# Patient Record
Sex: Male | Born: 1950
Health system: Southern US, Community
[De-identification: ages and names within clinical notes are randomized; demographics above are authoritative.]

## PROBLEM LIST (undated history)

## (undated) DIAGNOSIS — I2510 Atherosclerotic heart disease of native coronary artery without angina pectoris: Secondary | ICD-10-CM

## (undated) DIAGNOSIS — C221 Intrahepatic bile duct carcinoma: Secondary | ICD-10-CM

## (undated) DIAGNOSIS — J449 Chronic obstructive pulmonary disease, unspecified: Secondary | ICD-10-CM

## (undated) DIAGNOSIS — I10 Essential (primary) hypertension: Secondary | ICD-10-CM

## (undated) DIAGNOSIS — I739 Peripheral vascular disease, unspecified: Secondary | ICD-10-CM

## (undated) DIAGNOSIS — C9290 Myeloid leukemia, unspecified, not having achieved remission: Secondary | ICD-10-CM

## (undated) DIAGNOSIS — Z860100 Personal history of colon polyps, unspecified: Secondary | ICD-10-CM

## (undated) DIAGNOSIS — M1990 Unspecified osteoarthritis, unspecified site: Secondary | ICD-10-CM

## (undated) DIAGNOSIS — E7800 Pure hypercholesterolemia, unspecified: Secondary | ICD-10-CM

## (undated) DIAGNOSIS — Z8042 Family history of malignant neoplasm of prostate: Secondary | ICD-10-CM

## (undated) DIAGNOSIS — M109 Gout, unspecified: Secondary | ICD-10-CM

## (undated) DIAGNOSIS — M779 Enthesopathy, unspecified: Secondary | ICD-10-CM

## (undated) DIAGNOSIS — N289 Disorder of kidney and ureter, unspecified: Secondary | ICD-10-CM

## (undated) DIAGNOSIS — C3490 Malignant neoplasm of unspecified part of unspecified bronchus or lung: Secondary | ICD-10-CM

## (undated) DIAGNOSIS — E1169 Type 2 diabetes mellitus with other specified complication: Secondary | ICD-10-CM

## (undated) DIAGNOSIS — Z83719 Family history of colon polyps, unspecified: Secondary | ICD-10-CM

## (undated) DIAGNOSIS — C9210 Chronic myeloid leukemia, BCR/ABL-positive, not having achieved remission: Secondary | ICD-10-CM

## (undated) DIAGNOSIS — H269 Unspecified cataract: Secondary | ICD-10-CM

## (undated) DIAGNOSIS — E785 Hyperlipidemia, unspecified: Secondary | ICD-10-CM

## (undated) DIAGNOSIS — Z8371 Family history of colonic polyps: Secondary | ICD-10-CM

## (undated) DIAGNOSIS — N400 Enlarged prostate without lower urinary tract symptoms: Secondary | ICD-10-CM

## (undated) DIAGNOSIS — C249 Malignant neoplasm of biliary tract, unspecified: Secondary | ICD-10-CM

## (undated) HISTORY — DX: Malignant neoplasm of unspecified part of unspecified bronchus or lung: C34.90

## (undated) HISTORY — DX: Unspecified osteoarthritis, unspecified site: M19.90

## (undated) HISTORY — DX: Enthesopathy, unspecified: M77.9

## (undated) HISTORY — DX: Gout, unspecified: M10.9

## (undated) HISTORY — DX: Unspecified cataract: H26.9

## (undated) HISTORY — DX: Chronic obstructive pulmonary disease, unspecified: J44.9

## (undated) HISTORY — DX: Essential (primary) hypertension: I10

## (undated) HISTORY — DX: Malignant neoplasm of biliary tract, unspecified: C24.9

## (undated) HISTORY — DX: Family history of malignant neoplasm of prostate: Z80.42

## (undated) HISTORY — DX: Myeloid leukemia, unspecified, not having achieved remission: C92.90

## (undated) HISTORY — DX: Intrahepatic bile duct carcinoma: C22.1

## (undated) HISTORY — DX: Personal history of colonic polyps: Z86.010

## (undated) HISTORY — PX: OTHER SURGICAL HISTORY: SHX169

## (undated) HISTORY — DX: Benign prostatic hyperplasia without lower urinary tract symptoms: N40.0

## (undated) HISTORY — PX: UPPER GASTROINTESTINAL ENDOSCOPY: SHX188

## (undated) HISTORY — DX: Hyperlipidemia, unspecified: E78.5

## (undated) HISTORY — PX: CHOLECYSTECTOMY: SHX55

## (undated) HISTORY — DX: Chronic myeloid leukemia, BCR/ABL-positive, not having achieved remission: C92.10

## (undated) HISTORY — DX: Family history of colon polyps, unspecified: Z83.719

## (undated) HISTORY — DX: Type 2 diabetes mellitus with other specified complication: E11.69

## (undated) HISTORY — DX: Peripheral vascular disease, unspecified: I73.9

## (undated) HISTORY — PX: APPENDECTOMY: SHX54

## (undated) HISTORY — DX: Pure hypercholesterolemia, unspecified: E78.00

## (undated) HISTORY — DX: Disorder of kidney and ureter, unspecified: N28.9

## (undated) HISTORY — DX: Family history of colonic polyps: Z83.71

## (undated) HISTORY — DX: Atherosclerotic heart disease of native coronary artery without angina pectoris: I25.10

## (undated) HISTORY — DX: Personal history of colon polyps, unspecified: Z86.0100

---

## 1998-09-23 HISTORY — PX: CARDIAC CATHETERIZATION: SHX172

## 1999-10-12 ENCOUNTER — Encounter: Admission: RE | Admit: 1999-10-12 | Discharge: 1999-10-12 | Payer: Self-pay | Admitting: *Deleted

## 1999-10-12 ENCOUNTER — Encounter: Payer: Self-pay | Admitting: *Deleted

## 2000-11-05 ENCOUNTER — Encounter: Payer: Self-pay | Admitting: Occupational Medicine

## 2000-11-05 ENCOUNTER — Encounter: Admission: RE | Admit: 2000-11-05 | Discharge: 2000-11-05 | Payer: Self-pay | Admitting: Occupational Medicine

## 2001-05-04 ENCOUNTER — Ambulatory Visit (HOSPITAL_COMMUNITY): Admission: RE | Admit: 2001-05-04 | Discharge: 2001-05-04 | Payer: Self-pay | Admitting: Gastroenterology

## 2001-05-04 ENCOUNTER — Encounter (INDEPENDENT_AMBULATORY_CARE_PROVIDER_SITE_OTHER): Payer: Self-pay

## 2008-08-27 ENCOUNTER — Emergency Department (HOSPITAL_COMMUNITY): Admission: EM | Admit: 2008-08-27 | Discharge: 2008-08-27 | Payer: Self-pay | Admitting: Emergency Medicine

## 2009-04-06 ENCOUNTER — Inpatient Hospital Stay (HOSPITAL_BASED_OUTPATIENT_CLINIC_OR_DEPARTMENT_OTHER): Admission: RE | Admit: 2009-04-06 | Discharge: 2009-04-06 | Payer: Self-pay | Admitting: Cardiology

## 2011-02-05 NOTE — Cardiovascular Report (Signed)
Kyle Montoya, PEW NO.:  [id number]   MEDICAL RECORD NO.:  [id number]          PATIENT TYPE:  OIB   LOCATION:  1965                         FACILITY:  [facility]   PHYSICIAN:  Peter M. Swaziland, M.D.  DATE OF BIRTH:  [birth year]   DATE OF PROCEDURE:  04/06/2009  DATE OF DISCHARGE:  04/06/2009                            CARDIAC CATHETERIZATION   INDICATIONS FOR PROCEDURE:  A 60 year old male with history of  hypertension, hypercholesterolemia, and right bundle-branch block.  Recent stress Cardiolite study showed an abnormal nuclear scan  demonstrating possible inferior wall ischemia.   PROCEDURE:  Left heart catheterization, coronary and left ventricular  angiography.   ACCESS:  Via the right femoral artery using standard Seldinger  technique.   EQUIPMENT:  4-French 4-cm left Judkins catheter, 4-French 3-DRC  catheter, 4-French pigtail catheter, and 4-French arterial sheath.   MEDICATIONS:  Local anesthesia, 1% Xylocaine, and Versed 2 mg IV.   CONTRAST:  90 mL of Omnipaque.   HEMODYNAMIC DATA:  Aortic pressure was 125/75 with a mean of 97 mmHg.  Left ventricular pressure is 122 with EDP of 15 mmHg.   ANGIOGRAPHIC DATA:  Left coronary artery arises and distributes  normally.  The left main coronary is normal.   The left anterior descending artery is a large vessel that wraps around  the apex and supplies the distal inferior wall.  There is mild diffuse  narrowing in the mid LAD, less than 20% without any focal plaque noted.  This may be a segment of mild bridging.   The left circumflex coronary artery appears normal.   The right coronary is a large dominant vessel.  It has mild irregularity  in the midvessel, approximately 10%.   Left ventricular angiography performed in the RAO view demonstrates  normal left ventricular size and contractility with normal systolic  function.  Ejection fraction is estimated at 50%.  No mitral  regurgitation or prolapse.   FINAL INTERPRETATION:  1. No significant obstructive atherosclerotic coronary artery disease.  2. Normal left ventricular function.           ______________________________  Peter M. Swaziland, M.D.     PMJ/MEDQ  D:  04/06/2009  T:  04/07/2009  Job:  [id number]   cc:   Dr. Morton Peters, PA

## 2011-02-05 NOTE — H&P (Signed)
[name], [name] NO.:  [id number]   MEDICAL RECORD NO.:  [id number]           PATIENT TYPE:   LOCATION:                                 FACILITY:   PHYSICIAN:  Kyle Montoya, M.D.  DATE OF BIRTH:  09/29/1950   DATE OF ADMISSION:  04/06/2009  DATE OF DISCHARGE:                              HISTORY & PHYSICAL   HISTORY OF PRESENT ILLNESS:  Kyle Montoya is a 60 year old white male with  known history of hypercholesterolemia and hypertension.  He has a  chronic right bundle branch block noted on ECG.  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  He underwent a  nuclear stress test on March 30, 2009.  He was able to exercise for 10  minutes on the Bruce protocol without significant ST segment changes or  chest pain.  However, his nuclear imaging demonstrated evidence of a  reversible inferior septal defect consistent with ischemia.  His left  ventricular function was normal with ejection fraction of 77%.  Compared  to a prior stress test in 2002 these findings were new.  He is now  admitted to undergo diagnostic cardiac catheterization.  It is  noteworthy that the patient did have a cardiac catheterization  approximately 13-15 years ago that he was told was normal.   PAST MEDICAL HISTORY:  Is significant for hypertension, hyperlipidemia  and asthma.  He has a history of arthritis.   ALLERGIES:  He has no known allergies.   CURRENT MEDICATIONS:  1. Include Celebrex 200 mg daily.  2. Tekturna 300 mg daily.  3. Crestor 20 mg daily.  4. Lovaza 4 grams daily.  5. Advair 250/50 two puffs daily.  6. Losartan/HCT 100/25 mg daily.   SOCIAL HISTORY:  The patient works in environmental work.  He does smoke  socially.  He is married and has two children.   FAMILY HISTORY:  Father died at age 4 with colon cancer.  Mother has a  history of hypertension and a brother also has hypertension.  He has  four other siblings who are in good  health.   REVIEW OF SYSTEMS:  He denies any shortness of breath, chest pain,  palpitations or claudication.  He has no history of TIA or stroke.  No  recent change in bowel or bladder habits.  All other review of systems  are reviewed and are negative.   PHYSICAL EXAMINATION:  GENERAL:  The patient is a well-developed male in  no apparent distress.  VITAL SIGNS:  Weight is 237.4 pounds, blood pressure is 128/80, pulse is  74 and regular.  His respirations are normal.  HEENT:  PERRLA, EOMI.  Oropharynx is clear.  NECK:  Neck is supple without JVD, adenopathy, thyromegaly or bruits.  LUNGS:  Lungs were clear.  CARDIAC:  Exam reveals a regular rate and rhythm without gallop, murmur,  rub or click.  ABDOMEN:  Abdomen is soft and nontender.  He has no masses or bruits.  EXTREMITIES:  Femoral and pedal pulses were 2+ and symmetric.  He has no  clubbing,  cyanosis or edema.  SKIN:  Skin is warm and dry.  NEUROLOGIC:  He is alert and oriented x4.  Cranial nerves II-XII are  intact.  He has no focal findings.   LABORATORY DATA:  ECG at rest demonstrates normal sinus rhythm with a  right bundle branch block.  His chest x-ray shows no active disease.  His BUN is 24 with a creatinine of 1.0, otherwise BMET, CBC and coags  were normal.   IMPRESSION:  1. Abnormal stress Cardiolite study demonstrating evidence of      inferoseptal ischemia.  2. Hypertension.  3. Hypercholesterolemia.  4. Right bundle branch block.  5. Asthma.   PLAN:  Will proceed with diagnostic cardiac catheterization with further  therapy pending these results.           ______________________________  Kyle Montoya, M.D.     PMJ/MEDQ  D:  04/05/2009  T:  04/05/2009  Job:  [id number]   cc:   Kyle Montoya, M.D.  Kyle Montoya, [place]

## 2011-02-08 NOTE — Op Note (Signed)
[facility]  Patient:    Kyle Montoya, Kyle Montoya                       MRN: [id number] Proc. Date: 05/04/01 Attending:  Verlin Grills, M.D.                           Operative Report  PROCEDURE:  Colonoscopy.  REFERRING PHYSICIAN:  Dr. Windle Guard.  INDICATION FOR PROCEDURE:  Mr. [name] (DOB:  [birth year]) is a 60 year old male who is referred for his first surveillance colonoscopy with polypectomy to prevent colon cancer. His younger brother has undergone colonoscopic exams to remove colon polyps.  ENDOSCOPIST:  Verlin Grills, M.D.  PREMEDICATION:  Versed 10 mg, Demerol 100 mg.  ENDOSCOPE:  Olympus pediatric colonoscope.  DESCRIPTION OF PROCEDURE:  After obtaining informed consent, the patient was placed in the left lateral decubitus position. I administered intravenous Demerol and intravenous Versed to achieve conscious sedation for the procedure. The patients cardiac rhythm, oxygen saturation and blood pressure were monitored throughout the procedure and documented in the medical record.  Anal inspection was normal. Digital rectal exam was normal. The Olympus pediatric video colonoscope was introduced into the rectum and easily advanced to the cecum. Colonic preparation for the exam today was excellent.  RECTUM:  Normal.  SIGMOID COLON/DESCENDING COLON:  Normal.  SPLENIC FLEXURE:  Normal.  TRANSVERSE COLON:  Normal.  HEPATIC FLEXURE:  Normal.  ASCENDING COLON:  From the ascending colon, three 0.5 mm sessile polyps were removed with the cold biopsy forceps and submitted for pathological interpretation.  CECUM/ILEOCECAL VALVE:  Normal.  ASSESSMENT:  Three small (0.5 mm) polyps removed from the ascending colon with the cold biopsy forceps; otherwise normal proctocolonoscopy to the cecum.  RECOMMENDATIONS:  Repeat colonoscopy in approximately five years. DD:  05/04/01 TD:  05/04/01 Job: [id number]

## 2011-12-05 ENCOUNTER — Telehealth: Payer: Self-pay | Admitting: Cardiology

## 2011-12-05 NOTE — Telephone Encounter (Signed)
LOVx4,stress,12,Cath faxed to [facility] @  [phone] 12/05/11/KM

## 2011-12-06 ENCOUNTER — Encounter: Payer: Self-pay | Admitting: *Deleted

## 2012-04-27 ENCOUNTER — Emergency Department (HOSPITAL_COMMUNITY): Payer: 59

## 2012-04-27 ENCOUNTER — Emergency Department (HOSPITAL_COMMUNITY)
Admission: EM | Admit: 2012-04-27 | Discharge: 2012-04-28 | Disposition: A | Discharge status: Home / Self Care | Payer: 59 | Attending: Emergency Medicine | Admitting: Emergency Medicine

## 2012-04-27 ENCOUNTER — Encounter (HOSPITAL_COMMUNITY): Payer: Self-pay | Admitting: Emergency Medicine

## 2012-04-27 DIAGNOSIS — F10929 Alcohol use, unspecified with intoxication, unspecified: Secondary | ICD-10-CM

## 2012-04-27 DIAGNOSIS — I2510 Atherosclerotic heart disease of native coronary artery without angina pectoris: Secondary | ICD-10-CM | POA: Insufficient documentation

## 2012-04-27 DIAGNOSIS — E119 Type 2 diabetes mellitus without complications: Secondary | ICD-10-CM | POA: Insufficient documentation

## 2012-04-27 DIAGNOSIS — Z79899 Other long term (current) drug therapy: Secondary | ICD-10-CM | POA: Insufficient documentation

## 2012-04-27 DIAGNOSIS — I10 Essential (primary) hypertension: Secondary | ICD-10-CM | POA: Insufficient documentation

## 2012-04-27 DIAGNOSIS — E7800 Pure hypercholesterolemia, unspecified: Secondary | ICD-10-CM | POA: Insufficient documentation

## 2012-04-27 DIAGNOSIS — Z8739 Personal history of other diseases of the musculoskeletal system and connective tissue: Secondary | ICD-10-CM | POA: Insufficient documentation

## 2012-04-27 DIAGNOSIS — R780 Finding of alcohol in blood: Secondary | ICD-10-CM

## 2012-04-27 DIAGNOSIS — F1010 Alcohol abuse, uncomplicated: Secondary | ICD-10-CM | POA: Insufficient documentation

## 2012-04-27 DIAGNOSIS — J45909 Unspecified asthma, uncomplicated: Secondary | ICD-10-CM | POA: Insufficient documentation

## 2012-04-27 LAB — CBC WITH DIFFERENTIAL/PLATELET
Basophils Relative: 2 % — ABNORMAL HIGH (ref 0–1)
Hemoglobin: 14.7 g/dL (ref 13.0–17.0)
Lymphs Abs: 2 10*3/uL (ref 0.7–4.0)
Monocytes Relative: 10 % (ref 3–12)
Neutro Abs: 3.7 10*3/uL (ref 1.7–7.7)
Neutrophils Relative %: 56 % (ref 43–77)
RBC: 4.63 MIL/uL (ref 4.22–5.81)

## 2012-04-27 LAB — COMPREHENSIVE METABOLIC PANEL
CO2: 24 mEq/L (ref 19–32)
Calcium: 8.6 mg/dL (ref 8.4–10.5)
Creatinine, Ser: 0.63 mg/dL (ref 0.50–1.35)
GFR calc Af Amer: >90 mL/min (ref >90)
GFR calc non Af Amer: >90 mL/min (ref >90)
Glucose, Bld: 97 mg/dL (ref 70–99)

## 2012-04-27 NOTE — ED Notes (Signed)
Patient transported to CT 

## 2012-04-27 NOTE — ED Notes (Signed)
Pt is experiencing generalized weakness. Pt unable to fulling sit up on own. Pt much weaker than original assessment. Pt experiencing slight left sided facial droop and bilateral upper extremity weakness. Amy, PA at bedside to assess pt.

## 2012-04-27 NOTE — ED Provider Notes (Addendum)
Developed difficulty speaking and difficulty moving all of his extremities approximately 7:50 PM tonight as witnessed by wife and family members. Admits to drinking 4 beers earlier tonight. He was incontinent of urine He also told his wife that he "couldn't remember anything" presently patient is asymptomatic he is alert Glasgow Coma Score 15 moves all extremities motor strength 5 over 5 overall DTRs symmetric bilaterally knee jerk ankle jerk and biceps was ordered bilaterally  Date: 04/27/2012  Rate: 70  Rhythm: normal sinus rhythm  QRS Axis: normal  Intervals: normal  ST/T Wave abnormalities: normal  Conduction Disutrbances:right bundle branch block  Narrative Interpretation:   Old EKG Reviewed: none available  Doug Sou, MD 04/27/12 2310  12:45 AM patient is alert Glasgow Coma Score 15 amylase without difficulty looks much improved the family.  Had lengthy discussion with patient and with family to get help with his alcohol problem. I also stressed the patient may have had a seizure but doubtful. He is advised not to drive until evaluated by neurologist he is referred to [facility]   Doug Sou, MD 04/28/12 [phone]

## 2012-04-27 NOTE — ED Notes (Signed)
Pt reports he was getting ready to go to bed when he started experiencing numbness and weakness in right hand and arm along with slurred speech, reports he could not move at all. After Fire and EMS arrived, pt reports he felt much better and doesn't feel unbalanced anymore. No obvious deficits noticed. No slurred speech detected while pt talking. Pt is A&Ox4. Equal hand grips.

## 2012-04-27 NOTE — ED Provider Notes (Signed)
History     CSN: [id number]  Arrival date & time 04/27/12  2122   First MD Initiated Contact with Patient 04/27/12 2226      Chief Complaint  Patient presents with  . Transient Ischemic Attack    (Consider location/radiation/quality/duration/timing/severity/associated sxs/prior treatment) HPI Comments: Judieth Keens 61 y.o. male   The chief complaint is: Patient presents with:   Transient Ischemic Attack   The patient has medical history significant for:   Past Medical History:   Hypercholesterolemia                                         HTN (hypertension)                                           Asthma                                                       CAD (coronary artery disease)                                PAD (peripheral artery disease)                              Renal insufficiency                                          Diabetes mellitus                                            Dyslipidemia                                                 Arthritis                                                   Patient here with family. Patient states that he was lying in bed and suddenly was unable to remember anything. Called for help. Wife states that he was drooling out of the left side of his mouth and unable to move his arms or legs. He was unable to speak correctly and seemed confused. He states that vision is blurry in both eyes. Wife states that he urinated on himself during this time. Wfe state that he drank about four beers tonight,.Wife and children are very worried and state "He is just not like this." Deny fever, chills, N/V or recent history of viral illness. Denies CP, SOB.    The history is provided by the patient, a  relative, a significant other and medical records. The history is limited by the condition of the patient.    Past Medical History  Diagnosis Date  . Hypercholesterolemia   . HTN (hypertension)   . Asthma   . CAD (coronary artery  disease)   . PAD (peripheral artery disease)   . Renal insufficiency   . Diabetes mellitus   . Dyslipidemia   . Arthritis     History reviewed. No pertinent past surgical history.  Family History  Problem Relation Age of Onset  . Colon cancer Father 68    deceased  . Hypertension Mother   . Hypertension Brother     History  Substance Use Topics  . Smoking status: Not on file  . Smokeless tobacco: Not on file  . Alcohol Use: Not on file      Review of Systems  Constitutional: Negative for fever, chills and fatigue.  HENT: Positive for drooling. Negative for hearing loss, trouble swallowing, neck pain and neck stiffness.   Eyes: Positive for visual disturbance.  Respiratory: Negative for cough, chest tightness and shortness of breath.   Cardiovascular: Negative for chest pain and palpitations.  Gastrointestinal: Negative for nausea, vomiting, abdominal pain and abdominal distention.  Musculoskeletal: Negative for myalgias and arthralgias.  Skin: Negative for rash.  Neurological: Positive for facial asymmetry. Negative for dizziness, seizures and headaches.  All other systems reviewed and are negative.    Allergies  Review of patient's allergies indicates no known allergies.  Home Medications   Current Outpatient Rx  Name Route Sig Dispense Refill  . ALLOPURINOL 100 MG PO TABS Oral Take 100 mg by mouth daily.    . ASPIRIN 325 MG PO TABS Oral Take 325 mg by mouth daily.    . CYCLOBENZAPRINE HCL 10 MG PO TABS Oral Take 10 mg by mouth 2 (two) times daily as needed. For spasms    . LOSARTAN POTASSIUM-HCTZ 100-25 MG PO TABS Oral Take 1 tablet by mouth daily.    . OMEGA-3-ACID ETHYL ESTERS 1 G PO CAPS Oral Take 1 g by mouth 4 (four) times daily.    Marland Kitchen ROSUVASTATIN CALCIUM 20 MG PO TABS Oral Take 20 mg by mouth daily.      BP 124/76  Pulse 72  Temp 97.8 F (36.6 C) (Oral)  Resp 17  Ht 5\' 9"  (1.753 m)  Wt 222 lb (100.699 kg)  BMI 32.78 kg/m2  SpO2 97%  Physical  Exam  Nursing note and vitals reviewed. Constitutional: He appears well-developed. He is cooperative.  HENT:  Head: Normocephalic and atraumatic.  Eyes: Pupils are equal, round, and reactive to light. Right eye exhibits no discharge. Left eye exhibits no discharge. Right conjunctiva is injected. Right conjunctiva has no hemorrhage. Left conjunctiva is injected. Left conjunctiva has no hemorrhage. No scleral icterus. Right eye exhibits no nystagmus. Left eye exhibits no nystagmus.       Patient unable to track movements.  Neck: No JVD present. Carotid bruit is not present.  Cardiovascular: Normal rate, regular rhythm and normal heart sounds.  Exam reveals no gallop and no friction rub.   No murmur heard. Pulmonary/Chest: Effort normal and breath sounds normal. No respiratory distress.  Abdominal: Soft. Bowel sounds are normal. He exhibits no distension and no mass. There is no tenderness. There is no guarding.  Genitourinary:       Patient is incontinent of urine.  Neurological: He is alert. He displays no atrophy, no tremor and normal reflexes. A cranial nerve deficit (  left facial asymmetry, able to wrinkle forehead., left facial droop) is present. No sensory deficit. He exhibits normal muscle tone. He displays no seizure activity.       Patient with bilateral weakness. 2/5 symmetrically.  Skin: He is not diaphoretic.    ED Course  Procedures  Patient seen and assessed.  Bilateral weakness and BL  Vision distrubance not likely for stroke, consulted with Dr.Jacubowitz who agrees, however we will run labs and get CT head to R/O intracranial etiologies or other abnormalities.  Results for orders placed during the hospital encounter of 04/27/12  COMPREHENSIVE METABOLIC PANEL      Component Value Range   Sodium 134 (*) 135 - 145 mEq/L   Potassium 3.4 (*) 3.5 - 5.1 mEq/L   Chloride 98  96 - 112 mEq/L   CO2 24  19 - 32 mEq/L   Glucose, Bld 97  70 - 99 mg/dL   BUN 9  6 - 23 mg/dL    Creatinine, Ser 1.61  0.50 - 1.35 mg/dL   Calcium 8.6  8.4 - 09.6 mg/dL   Total Protein 7.1  6.0 - 8.3 g/dL   Albumin 3.8  3.5 - 5.2 g/dL   AST 24  0 - 37 U/L   ALT 25  0 - 53 U/L   Alkaline Phosphatase 29 (*) 39 - 117 U/L   Total Bilirubin 0.4  0.3 - 1.2 mg/dL   GFR calc non Af Amer >90  >90 mL/min   GFR calc Af Amer >90  >90 mL/min  CBC WITH DIFFERENTIAL      Component Value Range   WBC 6.6  4.0 - 10.5 K/uL   RBC 4.63  4.22 - 5.81 MIL/uL   Hemoglobin 14.7  13.0 - 17.0 g/dL   HCT 04.5  40.9 - 81.1 %   MCV 87.9  78.0 - 100.0 fL   MCH 31.7  26.0 - 34.0 pg   MCHC 36.1 (*) 30.0 - 36.0 g/dL   RDW 91.4  78.2 - 95.6 %   Platelets 194  150 - 400 K/uL   Neutrophils Relative 56  43 - 77 %   Neutro Abs 3.7  1.7 - 7.7 K/uL   Lymphocytes Relative 30  12 - 46 %   Lymphs Abs 2.0  0.7 - 4.0 K/uL   Monocytes Relative 10  3 - 12 %   Monocytes Absolute 0.7  0.1 - 1.0 K/uL   Eosinophils Relative 2  0 - 5 %   Eosinophils Absolute 0.1  0.0 - 0.7 K/uL   Basophils Relative 2 (*) 0 - 1 %   Basophils Absolute 0.1  0.0 - 0.1 K/uL  ETHANOL      Component Value Range   Alcohol, Ethyl (B) 197 (*) 0 - 11 mg/dL  URINE RAPID DRUG SCREEN (HOSP PERFORMED)      Component Value Range   Opiates NONE DETECTED  NONE DETECTED   Cocaine NONE DETECTED  NONE DETECTED   Benzodiazepines NONE DETECTED  NONE DETECTED   Amphetamines NONE DETECTED  NONE DETECTED   Tetrahydrocannabinol NONE DETECTED  NONE DETECTED   Barbiturates NONE DETECTED  NONE DETECTED   CT Head Wo Contrast (Final result)   Result time:04/28/12 0004    Final result by Rad Results In Interface (04/28/12 00:04:51)    Narrative:   *RADIOLOGY REPORT*  Clinical Data: Altered mental status, weakness  CT HEAD WITHOUT CONTRAST  Technique: Contiguous axial images were obtained from the base of the skull through  the vertex without contrast.  Comparison: None.  Findings: No acute intracranial hemorrhage, mass lesion, midline shift, infarction,  herniation, hydrocephalus, or extra-axial fluid collection. Gray-white matter differentiation maintained. Cisterns patent. No cerebellar abnormality. Symmetric orbits. Mastoids clear. Mild chronic-appearing maxillary mucosal thickening. Chronic incomplete fusion of the C1 arch noted incidentally.  IMPRESSION: No acute intracranial finding  Original Report Authenticated By: Judie Petit. Ruel Favors, M.D.   CT shows no intractranial bleeding. Labs are remarkable for some mild hypokalemia and blood ETOH level of 197.  Patient reevaluated in conjunctions with Dr. Ethelda Chick. Neurologic symptoms are resolved. Patient A&O x 3, ablke to ambulate unassisted, Discussed ETOH levels, reccommended  Daily intake maximums, and etoh abuse. Discussed ETOH as possible source of sxs versus possible seizure/postictal activity.Patient unaware that 1 case of beer/night is excessive ETOH use.  Patient referred to [facility] and primary care for Follow up. No driving instructed until cleared with neurology.  Hypokalemia corrected with 40 K PO. No results found. 1. Alcohol intoxication   2. Elevated ETOH level       MDM  D/C patient . Outpatient f/u with  PCP and neurology. Discussed reasons to return.        Arthor Captain, PA-C 04/28/12 1138

## 2012-04-27 NOTE — ED Notes (Addendum)
Per EMS - about an hour ago, pt's right hand started to go numb, he couldn't move his right arm and was experiencing slurred speech. Pt's wife called EMS. Fire arrived first, administered oxygen. When EMS arrived pt was not experiencing deficits anymore. 18G IV started in left hand. CBG 89, BP 180/108, HR 88.

## 2012-04-28 LAB — RAPID URINE DRUG SCREEN, HOSP PERFORMED
Amphetamines: NOT DETECTED
Cocaine: NOT DETECTED
Opiates: NOT DETECTED
Tetrahydrocannabinol: NOT DETECTED

## 2012-04-28 MED ORDER — POTASSIUM CHLORIDE CRYS ER 20 MEQ PO TBCR
40.0000 meq | EXTENDED_RELEASE_TABLET | Freq: Once | ORAL | Status: AC
  Administered 2012-04-28: 40 meq via ORAL
  Filled 2012-04-28: qty 2

## 2012-04-28 NOTE — ED Notes (Signed)
The patient is AOx4 and comfortable with his discharge instructions.  His family is driving him home. 

## 2012-04-28 NOTE — ED Notes (Signed)
MD at bedside. 

## 2012-04-29 NOTE — ED Provider Notes (Signed)
Medical screening examination/treatment/procedure(s) were conducted as a shared visit with non-physician practitioner(s) and myself.  I personally evaluated the patient during the encounter  Doug Sou, MD 04/29/12 [phone]

## 2012-09-23 HISTORY — PX: OTHER SURGICAL HISTORY: SHX169

## 2013-01-22 DIAGNOSIS — D638 Anemia in other chronic diseases classified elsewhere: Secondary | ICD-10-CM

## 2013-01-22 DIAGNOSIS — J4489 Other specified chronic obstructive pulmonary disease: Secondary | ICD-10-CM | POA: Insufficient documentation

## 2013-01-22 DIAGNOSIS — I10 Essential (primary) hypertension: Secondary | ICD-10-CM | POA: Insufficient documentation

## 2013-01-22 DIAGNOSIS — J449 Chronic obstructive pulmonary disease, unspecified: Secondary | ICD-10-CM | POA: Insufficient documentation

## 2013-01-22 HISTORY — DX: Essential (primary) hypertension: I10

## 2013-01-22 HISTORY — DX: Other specified chronic obstructive pulmonary disease: J44.89

## 2013-01-22 HISTORY — DX: Anemia in other chronic diseases classified elsewhere: D63.8

## 2013-01-26 ENCOUNTER — Encounter: Payer: Self-pay | Admitting: Cardiology

## 2013-02-15 DIAGNOSIS — C249 Malignant neoplasm of biliary tract, unspecified: Secondary | ICD-10-CM

## 2013-02-15 DIAGNOSIS — Z8509 Personal history of malignant neoplasm of other digestive organs: Secondary | ICD-10-CM

## 2013-02-15 DIAGNOSIS — C221 Intrahepatic bile duct carcinoma: Secondary | ICD-10-CM | POA: Insufficient documentation

## 2013-02-15 HISTORY — DX: Malignant neoplasm of biliary tract, unspecified: C24.9

## 2013-02-15 HISTORY — DX: Personal history of malignant neoplasm of other digestive organs: Z85.09

## 2013-02-25 HISTORY — PX: WHIPPLE PROCEDURE: SHX2667

## 2013-03-11 DIAGNOSIS — K8689 Other specified diseases of pancreas: Secondary | ICD-10-CM | POA: Insufficient documentation

## 2014-06-29 ENCOUNTER — Other Ambulatory Visit (HOSPITAL_COMMUNITY)
Admission: RE | Admit: 2014-06-29 | Discharge: 2014-06-29 | Disposition: A | Discharge status: Home / Self Care | Payer: 59 | Source: Ambulatory Visit | Attending: Oncology | Admitting: Oncology

## 2014-06-29 DIAGNOSIS — C9210 Chronic myeloid leukemia, BCR/ABL-positive, not having achieved remission: Secondary | ICD-10-CM | POA: Insufficient documentation

## 2014-06-29 LAB — BONE MARROW EXAM

## 2014-07-06 LAB — CHROMOSOME ANALYSIS, BONE MARROW

## 2014-07-13 ENCOUNTER — Encounter (HOSPITAL_COMMUNITY): Payer: Self-pay

## 2014-08-28 DIAGNOSIS — R918 Other nonspecific abnormal finding of lung field: Secondary | ICD-10-CM | POA: Insufficient documentation

## 2014-09-23 HISTORY — PX: LUNG CANCER SURGERY: SHX702

## 2014-09-28 DIAGNOSIS — C9210 Chronic myeloid leukemia, BCR/ABL-positive, not having achieved remission: Secondary | ICD-10-CM | POA: Diagnosis not present

## 2014-09-30 DIAGNOSIS — Z01818 Encounter for other preprocedural examination: Secondary | ICD-10-CM | POA: Diagnosis not present

## 2014-09-30 DIAGNOSIS — R0602 Shortness of breath: Secondary | ICD-10-CM | POA: Diagnosis not present

## 2014-09-30 DIAGNOSIS — C3492 Malignant neoplasm of unspecified part of left bronchus or lung: Secondary | ICD-10-CM | POA: Diagnosis not present

## 2014-10-11 DIAGNOSIS — R0602 Shortness of breath: Secondary | ICD-10-CM | POA: Diagnosis not present

## 2014-10-11 DIAGNOSIS — D638 Anemia in other chronic diseases classified elsewhere: Secondary | ICD-10-CM | POA: Diagnosis not present

## 2014-10-11 DIAGNOSIS — F17200 Nicotine dependence, unspecified, uncomplicated: Secondary | ICD-10-CM | POA: Diagnosis not present

## 2014-10-11 DIAGNOSIS — I10 Essential (primary) hypertension: Secondary | ICD-10-CM | POA: Diagnosis not present

## 2014-10-11 DIAGNOSIS — C3412 Malignant neoplasm of upper lobe, left bronchus or lung: Secondary | ICD-10-CM | POA: Diagnosis not present

## 2014-10-11 DIAGNOSIS — Z01818 Encounter for other preprocedural examination: Secondary | ICD-10-CM | POA: Diagnosis not present

## 2014-10-11 DIAGNOSIS — J45909 Unspecified asthma, uncomplicated: Secondary | ICD-10-CM | POA: Diagnosis not present

## 2014-10-11 DIAGNOSIS — Z0389 Encounter for observation for other suspected diseases and conditions ruled out: Secondary | ICD-10-CM | POA: Diagnosis not present

## 2014-10-18 DIAGNOSIS — M109 Gout, unspecified: Secondary | ICD-10-CM | POA: Insufficient documentation

## 2014-10-18 DIAGNOSIS — M47812 Spondylosis without myelopathy or radiculopathy, cervical region: Secondary | ICD-10-CM | POA: Insufficient documentation

## 2014-10-18 DIAGNOSIS — C9210 Chronic myeloid leukemia, BCR/ABL-positive, not having achieved remission: Secondary | ICD-10-CM

## 2014-10-18 HISTORY — DX: Chronic myeloid leukemia, BCR/ABL-positive, not having achieved remission: C92.10

## 2014-10-18 HISTORY — DX: Spondylosis without myelopathy or radiculopathy, cervical region: M47.812

## 2014-10-26 DIAGNOSIS — C9210 Chronic myeloid leukemia, BCR/ABL-positive, not having achieved remission: Secondary | ICD-10-CM | POA: Diagnosis not present

## 2014-10-26 DIAGNOSIS — C3412 Malignant neoplasm of upper lobe, left bronchus or lung: Secondary | ICD-10-CM | POA: Diagnosis not present

## 2014-10-26 DIAGNOSIS — D72829 Elevated white blood cell count, unspecified: Secondary | ICD-10-CM | POA: Diagnosis not present

## 2014-10-27 DIAGNOSIS — C3412 Malignant neoplasm of upper lobe, left bronchus or lung: Secondary | ICD-10-CM | POA: Diagnosis not present

## 2014-10-27 DIAGNOSIS — F17210 Nicotine dependence, cigarettes, uncomplicated: Secondary | ICD-10-CM | POA: Diagnosis present

## 2014-10-27 DIAGNOSIS — J45909 Unspecified asthma, uncomplicated: Secondary | ICD-10-CM | POA: Diagnosis present

## 2014-10-27 DIAGNOSIS — N401 Enlarged prostate with lower urinary tract symptoms: Secondary | ICD-10-CM | POA: Diagnosis present

## 2014-10-27 DIAGNOSIS — R911 Solitary pulmonary nodule: Secondary | ICD-10-CM | POA: Diagnosis not present

## 2014-10-27 DIAGNOSIS — R338 Other retention of urine: Secondary | ICD-10-CM | POA: Diagnosis present

## 2014-10-27 DIAGNOSIS — E780 Pure hypercholesterolemia: Secondary | ICD-10-CM | POA: Diagnosis present

## 2014-10-27 DIAGNOSIS — M109 Gout, unspecified: Secondary | ICD-10-CM | POA: Diagnosis present

## 2014-10-27 DIAGNOSIS — C9210 Chronic myeloid leukemia, BCR/ABL-positive, not having achieved remission: Secondary | ICD-10-CM | POA: Diagnosis present

## 2014-10-27 DIAGNOSIS — J449 Chronic obstructive pulmonary disease, unspecified: Secondary | ICD-10-CM | POA: Diagnosis present

## 2014-10-27 DIAGNOSIS — I10 Essential (primary) hypertension: Secondary | ICD-10-CM | POA: Diagnosis present

## 2014-11-21 DIAGNOSIS — C3412 Malignant neoplasm of upper lobe, left bronchus or lung: Secondary | ICD-10-CM | POA: Diagnosis not present

## 2014-11-24 DIAGNOSIS — C9211 Chronic myeloid leukemia, BCR/ABL-positive, in remission: Secondary | ICD-10-CM | POA: Diagnosis not present

## 2014-11-24 DIAGNOSIS — C3412 Malignant neoplasm of upper lobe, left bronchus or lung: Secondary | ICD-10-CM | POA: Diagnosis not present

## 2014-11-24 DIAGNOSIS — C221 Intrahepatic bile duct carcinoma: Secondary | ICD-10-CM | POA: Diagnosis not present

## 2014-12-05 ENCOUNTER — Encounter (INDEPENDENT_AMBULATORY_CARE_PROVIDER_SITE_OTHER): Payer: Self-pay | Admitting: Ophthalmology

## 2014-12-08 ENCOUNTER — Encounter (INDEPENDENT_AMBULATORY_CARE_PROVIDER_SITE_OTHER): Payer: 59 | Admitting: Ophthalmology

## 2014-12-08 DIAGNOSIS — H2511 Age-related nuclear cataract, right eye: Secondary | ICD-10-CM | POA: Diagnosis not present

## 2014-12-08 DIAGNOSIS — H43813 Vitreous degeneration, bilateral: Secondary | ICD-10-CM

## 2014-12-08 DIAGNOSIS — I10 Essential (primary) hypertension: Secondary | ICD-10-CM

## 2014-12-08 DIAGNOSIS — H59032 Cystoid macular edema following cataract surgery, left eye: Secondary | ICD-10-CM

## 2014-12-08 DIAGNOSIS — H35033 Hypertensive retinopathy, bilateral: Secondary | ICD-10-CM

## 2015-01-19 ENCOUNTER — Encounter (INDEPENDENT_AMBULATORY_CARE_PROVIDER_SITE_OTHER): Payer: Medicare Other | Admitting: Ophthalmology

## 2015-01-26 ENCOUNTER — Encounter (INDEPENDENT_AMBULATORY_CARE_PROVIDER_SITE_OTHER): Payer: 59 | Admitting: Ophthalmology

## 2015-01-26 DIAGNOSIS — H35033 Hypertensive retinopathy, bilateral: Secondary | ICD-10-CM | POA: Diagnosis not present

## 2015-01-26 DIAGNOSIS — H59032 Cystoid macular edema following cataract surgery, left eye: Secondary | ICD-10-CM

## 2015-01-26 DIAGNOSIS — H43813 Vitreous degeneration, bilateral: Secondary | ICD-10-CM | POA: Diagnosis not present

## 2015-01-26 DIAGNOSIS — I10 Essential (primary) hypertension: Secondary | ICD-10-CM

## 2015-01-31 ENCOUNTER — Encounter: Payer: Self-pay | Admitting: Critical Care Medicine

## 2015-01-31 ENCOUNTER — Ambulatory Visit (INDEPENDENT_AMBULATORY_CARE_PROVIDER_SITE_OTHER): Payer: 59 | Admitting: Critical Care Medicine

## 2015-01-31 VITALS — BP 142/78 | HR 63 | Temp 97.6°F | Ht 71.0 in | Wt 216.2 lb

## 2015-01-31 DIAGNOSIS — F17200 Nicotine dependence, unspecified, uncomplicated: Secondary | ICD-10-CM

## 2015-01-31 DIAGNOSIS — J45901 Unspecified asthma with (acute) exacerbation: Secondary | ICD-10-CM | POA: Insufficient documentation

## 2015-01-31 DIAGNOSIS — R059 Cough, unspecified: Secondary | ICD-10-CM

## 2015-01-31 DIAGNOSIS — N400 Enlarged prostate without lower urinary tract symptoms: Secondary | ICD-10-CM | POA: Insufficient documentation

## 2015-01-31 DIAGNOSIS — R053 Chronic cough: Secondary | ICD-10-CM

## 2015-01-31 DIAGNOSIS — M1990 Unspecified osteoarthritis, unspecified site: Secondary | ICD-10-CM

## 2015-01-31 DIAGNOSIS — C3412 Malignant neoplasm of upper lobe, left bronchus or lung: Secondary | ICD-10-CM

## 2015-01-31 DIAGNOSIS — C3490 Malignant neoplasm of unspecified part of unspecified bronchus or lung: Secondary | ICD-10-CM | POA: Insufficient documentation

## 2015-01-31 DIAGNOSIS — R062 Wheezing: Secondary | ICD-10-CM

## 2015-01-31 DIAGNOSIS — IMO0002 Reserved for concepts with insufficient information to code with codable children: Secondary | ICD-10-CM | POA: Insufficient documentation

## 2015-01-31 DIAGNOSIS — Z79899 Other long term (current) drug therapy: Secondary | ICD-10-CM | POA: Insufficient documentation

## 2015-01-31 DIAGNOSIS — R945 Abnormal results of liver function studies: Secondary | ICD-10-CM | POA: Insufficient documentation

## 2015-01-31 DIAGNOSIS — R7989 Other specified abnormal findings of blood chemistry: Secondary | ICD-10-CM | POA: Insufficient documentation

## 2015-01-31 DIAGNOSIS — E785 Hyperlipidemia, unspecified: Secondary | ICD-10-CM

## 2015-01-31 DIAGNOSIS — J4541 Moderate persistent asthma with (acute) exacerbation: Secondary | ICD-10-CM

## 2015-01-31 DIAGNOSIS — J441 Chronic obstructive pulmonary disease with (acute) exacerbation: Secondary | ICD-10-CM

## 2015-01-31 DIAGNOSIS — G4700 Insomnia, unspecified: Secondary | ICD-10-CM

## 2015-01-31 DIAGNOSIS — G8929 Other chronic pain: Secondary | ICD-10-CM

## 2015-01-31 DIAGNOSIS — Z85118 Personal history of other malignant neoplasm of bronchus and lung: Secondary | ICD-10-CM | POA: Insufficient documentation

## 2015-01-31 DIAGNOSIS — H269 Unspecified cataract: Secondary | ICD-10-CM | POA: Insufficient documentation

## 2015-01-31 DIAGNOSIS — R05 Cough: Secondary | ICD-10-CM

## 2015-01-31 HISTORY — DX: Benign prostatic hyperplasia without lower urinary tract symptoms: N40.0

## 2015-01-31 HISTORY — DX: Chronic cough: R05.3

## 2015-01-31 HISTORY — DX: Other chronic pain: G89.29

## 2015-01-31 HISTORY — DX: Insomnia, unspecified: G47.00

## 2015-01-31 HISTORY — DX: Unspecified osteoarthritis, unspecified site: M19.90

## 2015-01-31 HISTORY — DX: Nicotine dependence, unspecified, uncomplicated: F17.200

## 2015-01-31 HISTORY — DX: Wheezing: R06.2

## 2015-01-31 HISTORY — DX: Hyperlipidemia, unspecified: E78.5

## 2015-01-31 HISTORY — DX: Malignant neoplasm of unspecified part of unspecified bronchus or lung: C34.90

## 2015-01-31 MED ORDER — BENZONATATE 100 MG PO CAPS: ORAL_CAPSULE | ORAL | Status: DC

## 2015-01-31 MED ORDER — LOSARTAN POTASSIUM-HCTZ 100-25 MG PO TABS: 1.0000 | ORAL_TABLET | Freq: Every day | ORAL | Status: DC

## 2015-01-31 MED ORDER — FLUTICASONE FUROATE-VILANTEROL 200-25 MCG/INH IN AEPB: 1.0000 | INHALATION_SPRAY | Freq: Every day | RESPIRATORY_TRACT | Status: DC

## 2015-01-31 NOTE — Patient Instructions (Addendum)
Stop smoking, use nicorette minis '4mg'$  , 3-4 per day Stop advair Start Breo 200 one puff daily Stop lisinopril/HCT Start Losartan/HCT '100mg'$  /25 mg daily Use Benzonatate 1-2 every 6 hours as needed for cough Obtain CT Chest non contrast early evaluate lung cancer recurrence A Bronchoscopy may be needed based on CT chest results Return 6 weeks

## 2015-01-31 NOTE — Progress Notes (Signed)
Subjective:    Patient ID: Kyle Montoya, male    DOB: 10-Jan-1951, 64 y.o.   MRN: [id number]  HPI Comments: Referral for chronic cough.   Hx of asthma , smoker, recent 10/2014   Left upper lobe adenoca removed at [place].  Hx of pancr CA 2014 with whipple at [facility] , [facility] followed by [facility] center.  No XRT or Chemorx for panc CA or lung CA.  On ACE inhibitor lisinopril  .  Asthma dx all his life.  Now with chronic cough in 11/2014.  Cold and lots of wind.  Sore throat URI, pcp rx zpak, rx benzonatate.  Pt f/u with CA MD and CXR neg but wanted this visit.   Cough This is a new problem. The current episode started more than 1 month ago. The problem has been gradually improving (cough is better but still present ). The problem occurs every few hours (not all night long). The cough is non-productive. Associated symptoms include chest pain, ear congestion, ear pain, rhinorrhea, shortness of breath and wheezing. Pertinent negatives include no fever, headaches, heartburn, hemoptysis, myalgias, nasal congestion, postnasal drip, rash or sore throat. Associated symptoms comments: No voice change Dyspnea with heavy exertion. The symptoms are aggravated by fumes, dust and pollens. Risk factors for lung disease include smoking/tobacco exposure. He has tried a beta-agonist inhaler for the symptoms. The treatment provided no relief. His past medical history is significant for asthma and pneumonia. There is no history of bronchitis, COPD or emphysema.    Past Medical History  Diagnosis Date  . Hypercholesterolemia   . HTN (hypertension)   . Asthma   . CAD (coronary artery disease)   . PAD (peripheral artery disease)   . Renal insufficiency   . Diabetes mellitus   . Dyslipidemia   . Arthritis   . Chronic myelogenous leukemia   . Lung cancer   . BPH (benign prostatic hyperplasia)   . DJD (degenerative joint disease)   . Gout   . Cholangiocarcinoma of biliary tract      Family History  Problem  Relation Age of Onset  . Prostate cancer Father 33    deceased  . Hypertension Mother   . Hypertension Brother   . Hypertension Sister   . Asthma Sister      History   Social History  . Marital Status: Married    Spouse Name: N/A  . Number of Children: 2  . Years of Education: N/A   Occupational History  . environmental    Social History Main Topics  . Smoking status: Current Every Day Smoker -- 0.25 packs/day for 40 years    Types: Cigarettes  . Smokeless tobacco: Never Used  . Alcohol Use: 3.6 oz/week    6 Standard drinks or equivalent per week  . Drug Use: Not on file  . Sexual Activity: Not on file   Other Topics Concern  . Not on file   Social History Narrative   Married, 2 children.   Works moves heavy equipment x 30 yrs.     No Known Allergies   Outpatient Prescriptions Prior to Visit  Medication Sig Dispense Refill  . cyclobenzaprine (FLEXERIL) 10 MG tablet Take 10 mg by mouth 2 (two) times daily as needed. For spasms    . allopurinol (ZYLOPRIM) 100 MG tablet Take 100 mg by mouth daily.    Marland Kitchen aspirin 325 MG tablet Take 325 mg by mouth daily.    Marland Kitchen losartan-hydrochlorothiazide (HYZAAR) 100-25 MG per  tablet Take 1 tablet by mouth daily.    Marland Kitchen omega-3 acid ethyl esters (LOVAZA) 1 G capsule Take 1 g by mouth 4 (four) times daily.    . rosuvastatin (CRESTOR) 20 MG tablet Take 20 mg by mouth daily.     No facility-administered medications prior to visit.      Review of Systems  Constitutional: Negative for fever.  HENT: Positive for ear pain, rhinorrhea and trouble swallowing. Negative for postnasal drip, sore throat and voice change.   Respiratory: Positive for cough, chest tightness, shortness of breath and wheezing. Negative for hemoptysis.   Cardiovascular: Positive for chest pain.  Gastrointestinal: Negative for heartburn.       Dysphagia  Musculoskeletal: Negative for myalgias.  Skin: Negative for rash.  Neurological: Negative for headaches.         Objective:   Physical Exam Filed Vitals:   01/31/15 0936  BP: 142/78  Pulse: 63  Temp: 97.6 F (36.4 C)  TempSrc: Oral  Height: '5\' 11"'$  (1.803 m)  Weight: 216 lb 3.2 oz (98.068 kg)  SpO2: 97%    Gen: Pleasant, well-nourished, in no distress,  normal affect  ENT: No lesions,  mouth clear,  oropharynx clear, no postnasal drip  Neck: No JVD, no TMG, no carotid bruits  Lungs: No use of accessory muscles, no dullness to percussion,exp wheezes, poor airflow  Cardiovascular: RRR, heart sounds normal, no murmur or gallops, no peripheral edema  Abdomen: soft and NT, no HSM,  BS normal  Musculoskeletal: No deformities, no cyanosis or clubbing  Neuro: alert, non focal  Skin: Warm, no lesions or rashes  No results found.  Severe obstruction on spirometry FeV1 54% FeV1/FVC 51% 01/31/2015 CXR NAD 01/25/15       Assessment & Plan:  Acute exacerbation of COPD with asthma Copd with asthma overlap syndrome. Ex smoker. Hx of lung cancer s/p resection. Severe obstruction on Spirometry Need to r/o upper airway obstruction. Ace inhibitor contributing to cough  Recent CXR nad, need to r/o airway obstruction in upper airway with CA hx Plan Stop smoking, use nicorette minis '4mg'$  , 3-4 per day Stop advair Start Breo 200 one puff daily Stop lisinopril/HCT Start Losartan/HCT '100mg'$  /25 mg daily Use Benzonatate 1-2 every 6 hours as needed for cough Obtain CT Chest non contrast early evaluate lung cancer recurrence A Bronchoscopy may be needed based on CT chest results Return 6 weeks       I had an extended discussion with the patient reviewing all relevant studies completed to date and  lasting 15 to 20 minutes of a 25 minute visit on the following ongoing concerns:   Pt dyspnea and cough management.

## 2015-01-31 NOTE — Assessment & Plan Note (Signed)
Copd with asthma overlap syndrome. Ex smoker. Hx of lung cancer s/p resection. Severe obstruction on Spirometry Need to r/o upper airway obstruction. Ace inhibitor contributing to cough  Recent CXR nad, need to r/o airway obstruction in upper airway with CA hx Plan Stop smoking, use nicorette minis '4mg'$  , 3-4 per day Stop advair Start Breo 200 one puff daily Stop lisinopril/HCT Start Losartan/HCT '100mg'$  /25 mg daily Use Benzonatate 1-2 every 6 hours as needed for cough Obtain CT Chest non contrast early evaluate lung cancer recurrence A Bronchoscopy may be needed based on CT chest results Return 6 weeks

## 2015-02-01 ENCOUNTER — Telehealth: Payer: Self-pay | Admitting: Critical Care Medicine

## 2015-02-01 NOTE — Telephone Encounter (Signed)
Results not in epic  Need someone to get result faxed to our office and msg me result

## 2015-02-01 NOTE — Telephone Encounter (Signed)
Pt requesting CT results from yesterday at [place] please advise on results.  Thanks!

## 2015-02-02 NOTE — Telephone Encounter (Signed)
Called and spoke to [place] at [facility] and had CT report faxed to triage fax. Will await fax.

## 2015-02-02 NOTE — Telephone Encounter (Signed)
Reports placed in PW lok at folder. thanks

## 2015-02-03 NOTE — Telephone Encounter (Signed)
The pt is aware the CT of the chest was clear.  No CA, no need for fob

## 2015-02-13 ENCOUNTER — Telehealth: Payer: Self-pay | Admitting: Critical Care Medicine

## 2015-02-13 NOTE — Telephone Encounter (Signed)
Let pt know ct chest was NORMAL,  No cancer recurrence

## 2015-02-13 NOTE — Telephone Encounter (Signed)
lmomtcb for pt 

## 2015-02-14 NOTE — Telephone Encounter (Signed)
Called, spoke with pt.  Discussed CT Chest results per Dr. Joya Gaskins.  He verbalized understanding and voiced no further questions or concerns at this time.

## 2015-02-21 ENCOUNTER — Telehealth: Payer: Self-pay | Admitting: Critical Care Medicine

## 2015-02-21 NOTE — Telephone Encounter (Signed)
Called and spoke to [facility].  Confirmed medication patient is taking.  Nothing further needed.

## 2015-03-09 ENCOUNTER — Encounter (INDEPENDENT_AMBULATORY_CARE_PROVIDER_SITE_OTHER): Payer: Medicare Other | Admitting: Ophthalmology

## 2015-03-15 ENCOUNTER — Ambulatory Visit: Payer: 59 | Admitting: Critical Care Medicine

## 2015-03-15 ENCOUNTER — Encounter (INDEPENDENT_AMBULATORY_CARE_PROVIDER_SITE_OTHER): Payer: 59 | Admitting: Ophthalmology

## 2015-03-15 DIAGNOSIS — H43813 Vitreous degeneration, bilateral: Secondary | ICD-10-CM | POA: Diagnosis not present

## 2015-03-15 DIAGNOSIS — H2511 Age-related nuclear cataract, right eye: Secondary | ICD-10-CM

## 2015-03-15 DIAGNOSIS — I10 Essential (primary) hypertension: Secondary | ICD-10-CM

## 2015-03-15 DIAGNOSIS — H35033 Hypertensive retinopathy, bilateral: Secondary | ICD-10-CM | POA: Diagnosis not present

## 2015-03-15 DIAGNOSIS — H59032 Cystoid macular edema following cataract surgery, left eye: Secondary | ICD-10-CM | POA: Diagnosis not present

## 2015-06-23 DIAGNOSIS — Z800 Family history of malignant neoplasm of digestive organs: Secondary | ICD-10-CM | POA: Diagnosis not present

## 2015-06-23 DIAGNOSIS — Z1211 Encounter for screening for malignant neoplasm of colon: Secondary | ICD-10-CM | POA: Diagnosis not present

## 2015-06-23 HISTORY — PX: COLONOSCOPY: SHX174

## 2015-09-11 DIAGNOSIS — H4062X1 Glaucoma secondary to drugs, left eye, mild stage: Secondary | ICD-10-CM | POA: Diagnosis not present

## 2015-09-11 DIAGNOSIS — N402 Nodular prostate without lower urinary tract symptoms: Secondary | ICD-10-CM | POA: Diagnosis not present

## 2015-09-11 DIAGNOSIS — H2012 Chronic iridocyclitis, left eye: Secondary | ICD-10-CM | POA: Diagnosis not present

## 2015-09-11 DIAGNOSIS — Z961 Presence of intraocular lens: Secondary | ICD-10-CM | POA: Diagnosis not present

## 2015-09-11 DIAGNOSIS — Z8042 Family history of malignant neoplasm of prostate: Secondary | ICD-10-CM | POA: Diagnosis not present

## 2015-09-11 DIAGNOSIS — N401 Enlarged prostate with lower urinary tract symptoms: Secondary | ICD-10-CM | POA: Diagnosis not present

## 2015-10-30 ENCOUNTER — Other Ambulatory Visit: Payer: Self-pay | Admitting: Critical Care Medicine

## 2015-11-13 DIAGNOSIS — Z85118 Personal history of other malignant neoplasm of bronchus and lung: Secondary | ICD-10-CM | POA: Diagnosis not present

## 2015-11-13 DIAGNOSIS — C9211 Chronic myeloid leukemia, BCR/ABL-positive, in remission: Secondary | ICD-10-CM | POA: Diagnosis not present

## 2015-11-13 DIAGNOSIS — M549 Dorsalgia, unspecified: Secondary | ICD-10-CM | POA: Diagnosis not present

## 2015-11-13 DIAGNOSIS — G8929 Other chronic pain: Secondary | ICD-10-CM | POA: Diagnosis not present

## 2015-11-13 DIAGNOSIS — Z8589 Personal history of malignant neoplasm of other organs and systems: Secondary | ICD-10-CM | POA: Diagnosis not present

## 2015-11-21 DIAGNOSIS — M549 Dorsalgia, unspecified: Secondary | ICD-10-CM | POA: Diagnosis not present

## 2015-11-21 DIAGNOSIS — M5136 Other intervertebral disc degeneration, lumbar region: Secondary | ICD-10-CM | POA: Diagnosis not present

## 2015-12-04 DIAGNOSIS — M545 Low back pain: Secondary | ICD-10-CM | POA: Diagnosis not present

## 2015-12-04 DIAGNOSIS — M5416 Radiculopathy, lumbar region: Secondary | ICD-10-CM | POA: Diagnosis not present

## 2016-01-25 DIAGNOSIS — H40013 Open angle with borderline findings, low risk, bilateral: Secondary | ICD-10-CM | POA: Diagnosis not present

## 2016-01-25 DIAGNOSIS — H5703 Miosis: Secondary | ICD-10-CM | POA: Diagnosis not present

## 2016-01-25 DIAGNOSIS — H4062X1 Glaucoma secondary to drugs, left eye, mild stage: Secondary | ICD-10-CM | POA: Diagnosis not present

## 2016-01-25 DIAGNOSIS — H524 Presbyopia: Secondary | ICD-10-CM | POA: Diagnosis not present

## 2016-02-08 DIAGNOSIS — D72829 Elevated white blood cell count, unspecified: Secondary | ICD-10-CM | POA: Diagnosis not present

## 2016-02-08 DIAGNOSIS — C9210 Chronic myeloid leukemia, BCR/ABL-positive, not having achieved remission: Secondary | ICD-10-CM | POA: Diagnosis not present

## 2016-02-08 DIAGNOSIS — Z9889 Other specified postprocedural states: Secondary | ICD-10-CM | POA: Diagnosis not present

## 2016-02-08 DIAGNOSIS — Z902 Acquired absence of lung [part of]: Secondary | ICD-10-CM | POA: Diagnosis not present

## 2016-02-08 DIAGNOSIS — C3412 Malignant neoplasm of upper lobe, left bronchus or lung: Secondary | ICD-10-CM | POA: Diagnosis not present

## 2016-02-12 DIAGNOSIS — C9210 Chronic myeloid leukemia, BCR/ABL-positive, not having achieved remission: Secondary | ICD-10-CM | POA: Diagnosis not present

## 2016-02-12 DIAGNOSIS — C3412 Malignant neoplasm of upper lobe, left bronchus or lung: Secondary | ICD-10-CM | POA: Diagnosis not present

## 2016-03-05 DIAGNOSIS — H2511 Age-related nuclear cataract, right eye: Secondary | ICD-10-CM | POA: Diagnosis not present

## 2016-05-13 DIAGNOSIS — N401 Enlarged prostate with lower urinary tract symptoms: Secondary | ICD-10-CM | POA: Diagnosis not present

## 2016-05-13 DIAGNOSIS — R351 Nocturia: Secondary | ICD-10-CM | POA: Diagnosis not present

## 2016-05-13 DIAGNOSIS — R972 Elevated prostate specific antigen [PSA]: Secondary | ICD-10-CM | POA: Diagnosis not present

## 2016-05-15 DIAGNOSIS — G8929 Other chronic pain: Secondary | ICD-10-CM | POA: Diagnosis not present

## 2016-05-15 DIAGNOSIS — I10 Essential (primary) hypertension: Secondary | ICD-10-CM | POA: Diagnosis not present

## 2016-05-15 DIAGNOSIS — R05 Cough: Secondary | ICD-10-CM | POA: Diagnosis not present

## 2016-05-15 DIAGNOSIS — E669 Obesity, unspecified: Secondary | ICD-10-CM | POA: Diagnosis not present

## 2016-05-15 DIAGNOSIS — Z6831 Body mass index (BMI) 31.0-31.9, adult: Secondary | ICD-10-CM | POA: Diagnosis not present

## 2016-05-15 DIAGNOSIS — R978 Other abnormal tumor markers: Secondary | ICD-10-CM | POA: Diagnosis not present

## 2016-05-15 DIAGNOSIS — C9210 Chronic myeloid leukemia, BCR/ABL-positive, not having achieved remission: Secondary | ICD-10-CM | POA: Diagnosis not present

## 2016-05-15 DIAGNOSIS — R0989 Other specified symptoms and signs involving the circulatory and respiratory systems: Secondary | ICD-10-CM | POA: Diagnosis not present

## 2016-06-04 ENCOUNTER — Ambulatory Visit: Payer: Medicare Other | Admitting: Pulmonary Disease

## 2016-07-02 ENCOUNTER — Encounter: Payer: Self-pay | Admitting: Pulmonary Disease

## 2016-07-02 ENCOUNTER — Ambulatory Visit (INDEPENDENT_AMBULATORY_CARE_PROVIDER_SITE_OTHER): Payer: 59 | Admitting: Pulmonary Disease

## 2016-07-02 VITALS — BP 130/76 | HR 71 | Ht 71.0 in | Wt 210.0 lb

## 2016-07-02 DIAGNOSIS — J449 Chronic obstructive pulmonary disease, unspecified: Secondary | ICD-10-CM

## 2016-07-02 DIAGNOSIS — Z23 Encounter for immunization: Secondary | ICD-10-CM | POA: Diagnosis not present

## 2016-07-02 DIAGNOSIS — R05 Cough: Secondary | ICD-10-CM | POA: Diagnosis not present

## 2016-07-02 DIAGNOSIS — C3490 Malignant neoplasm of unspecified part of unspecified bronchus or lung: Secondary | ICD-10-CM

## 2016-07-02 DIAGNOSIS — R053 Chronic cough: Secondary | ICD-10-CM

## 2016-07-02 MED ORDER — FLUTICASONE-SALMETEROL 500-50 MCG/DOSE IN AEPB: 1.0000 | INHALATION_SPRAY | Freq: Two times a day (BID) | RESPIRATORY_TRACT | 0 refills | Status: DC

## 2016-07-02 MED ORDER — FLUTICASONE-SALMETEROL 500-50 MCG/DOSE IN AEPB: 1.0000 | INHALATION_SPRAY | Freq: Two times a day (BID) | RESPIRATORY_TRACT | 5 refills | Status: DC

## 2016-07-02 MED ORDER — LOSARTAN POTASSIUM-HCTZ 100-25 MG PO TABS: 1.0000 | ORAL_TABLET | Freq: Every day | ORAL | 5 refills | Status: DC

## 2016-07-02 NOTE — Assessment & Plan Note (Signed)
He has a history of adenocarcinoma of the lung. He follows with oncology in [place] and is supposed to have a CT scan next month. Based on my review of images from this year there is been no evidence of recurrence.

## 2016-07-02 NOTE — Progress Notes (Signed)
Subjective:    Patient ID: Kyle Montoya., male    DOB: 06-20-1951, 65 y.o.   MRN: [id number]  Synopsis: Former patient of Dr. Joya Gaskins who has COPD and asthma overlap syndrome. May 2016 simple spirometry showed FEV1 of 54% predicted 1.99 L. He was also diagnosed with adenocarcinoma of the lung and underwent a left upper lobectomy and mediastinal lymph node dissection in February 2016. Pathology was noted to be T1b N0 adenocarcinoma. He also has chronic leukemia, he isn't sure which kind.  HPI Chief Complaint  Patient presents with  . Follow-up    former PW pt c/o worsening prod cough X1 year.  states he had lung sx to remove CA X1 yr ago at [facility].  Cough is prod with clear mucus.  Denies CP, PND, sinus congestion.     [name] is here to see me today to follow up about his COPD.  Cough> all day, produces mucus a lot which is clear, it is worse than a year ago.  Nothing specifically makes it better.   Doesn't feel short of breath.  He wheezes a lot, says that he can hear it.  He says it is worse since his surgery.  The wheezing comes and goes, he can't really identify a trigger other than hot weather.  He still smokes cigarettes, he says probably 10 cigarettes a week.  In the last year he had a bad cold, but he doesn't recall having to go to the doctor.  He used to take a lot of mucinex, not so much now.     Past Medical History:  Diagnosis Date  . Arthritis   . Asthma   . BPH (benign prostatic hyperplasia)   . CAD (coronary artery disease)   . Cholangiocarcinoma of biliary tract ([place])   . Chronic myelogenous leukemia ([place])   . Diabetes mellitus   . DJD (degenerative joint disease)   . Dyslipidemia   . Gout   . HTN (hypertension)   . Hypercholesterolemia   . Lung cancer ([place])   . PAD (peripheral artery disease) ([place])   . Renal insufficiency       Review of Systems  Constitutional: Negative for chills, fatigue and fever.  HENT: Negative for postnasal drip,  rhinorrhea and sinus pressure.   Respiratory: Positive for cough and wheezing. Negative for shortness of breath.   Cardiovascular: Negative for chest pain, palpitations and leg swelling.       Objective:   Physical Exam  Vitals:   07/02/16 0917  BP: 130/76  Pulse: 71  SpO2: 98%  Weight: 210 lb (95.3 kg)  Height: '5\' 11"'$  (1.803 m)    RA  Gen: well appearing, no acute distress HENT: NCAT, OP clear, neck supple without masses Eyes: PERRL, EOMi Lymph: no cervical lymphadenopathy PULM: CTA B CV: RRR, no mgr, no JVD GI: BS+, soft, nontender, no hsm Derm: no rash or skin breakdown MSK: normal bulk and tone Neuro: A&Ox4, CN II-XII intact, strength 5/5 in all 4 extremities Psyche: normal mood and affect  Notes from Dr. Joya Gaskins were reviewed where he was treated for COPD and asthma Records from [facility] reviewed where he was treated for adenocarcinoma of the lung with a left upper lobectomy in February 2016.  Chest x-rays from 2017 reviewed showing evidence of a left upper lobectomy but no evidence of recurrence of lung cancer     Assessment & Plan:  COPD with asthma ([place]) He has moderate airflow obstruction based on  the May 2016 spirometry testing with a history of lifelong asthma and tobacco use. So he has a COPD-asthma overlap syndrome. I worry that his recent cough and wheezes related to the ACE inhibitor use.  His flu shot is up-to-date and he is have the Prevnar vaccine but he needs to have a Pneumovax.  Plan: Pneumovax today Increase Advair dose to 500/50, samples provided today Stop lisinopril Follow-up 4 weeks  Chronic cough As noted above were going to stop the lisinopril today.  Plan: Prescribed losartan-hydrochlorothiazide 100-25 daily  Non-small cell carcinoma of lung ([place]) He has a history of adenocarcinoma of the lung. He follows with oncology in [place] and is supposed to have a CT scan next month. Based on my review of images from  this year there is been no evidence of recurrence.    Current Outpatient Prescriptions:  .  albuterol (PROVENTIL HFA;VENTOLIN HFA) 108 (90 BASE) MCG/ACT inhaler, Inhale 2 puffs into the lungs every 6 (six) hours as needed for wheezing or shortness of breath., Disp: , Rfl:  .  allopurinol (ZYLOPRIM) 300 MG tablet, Take 300 mg by mouth daily., Disp: , Rfl:  .  dasatinib (SPRYCEL) 100 MG tablet, Take 100 mg by mouth daily., Disp: , Rfl:  .  Fluticasone-Salmeterol (ADVAIR) 250-50 MCG/DOSE AEPB, Inhale 1 puff into the lungs 2 (two) times daily., Disp: , Rfl:  .  tamsulosin (FLOMAX) 0.4 MG CAPS capsule, Take 0.8 mg by mouth daily. , Disp: , Rfl:

## 2016-07-02 NOTE — Addendum Note (Signed)
Addended by: Maury Dus L on: 07/02/2016 10:02 AM   Modules accepted: Orders

## 2016-07-02 NOTE — Assessment & Plan Note (Signed)
He has moderate airflow obstruction based on the May 2016 spirometry testing with a history of lifelong asthma and tobacco use. So he has a COPD-asthma overlap syndrome. I worry that his recent cough and wheezes related to the ACE inhibitor use.  His flu shot is up-to-date and he is have the Prevnar vaccine but he needs to have a Pneumovax.  Plan: Pneumovax today Increase Advair dose to 500/50, samples provided today Stop lisinopril Follow-up 4 weeks

## 2016-07-02 NOTE — Assessment & Plan Note (Signed)
As noted above were going to stop the lisinopril today.  Plan: Prescribed losartan-hydrochlorothiazide 100-25 daily

## 2016-07-02 NOTE — Addendum Note (Signed)
Addended by: Parke Poisson E on: 07/02/2016 09:53 AM   Modules accepted: Orders

## 2016-07-02 NOTE — Patient Instructions (Signed)
Stop smoking Stop taking the blood pressure pill you have at home Start taking the new blood pressure pill we gave you Start taking the new dose of Advair that we provided you today We'll see you back in 4-6 weeks with our nurse practitioner to make sure that your blood pressure is doing okay this see if you had improvement in the cough and wheezing.

## 2016-07-03 ENCOUNTER — Telehealth: Payer: Self-pay | Admitting: Pulmonary Disease

## 2016-07-03 NOTE — Telephone Encounter (Signed)
Sounds like he needs to be seen

## 2016-07-03 NOTE — Telephone Encounter (Signed)
Called spoke with patient, advised of BQ's recommendations BQ with no openings Appt scheduled with SG for tomorrow 10.12.17 @ 1030 Nothing further needed; will sign off

## 2016-07-03 NOTE — Telephone Encounter (Signed)
Spoke with pt. States that he had a PNA vaccine yesterday. He believes he may be having an allergic reaction to this. His left arm from the shoulder to his elbow is swollen and warm to the touch. He denies SOB or difficulty swallowing. Advised pt to seek medical if he develops these symptoms.  BQ - please advise. Thanks!

## 2016-07-04 ENCOUNTER — Encounter: Payer: Self-pay | Admitting: Acute Care

## 2016-07-04 ENCOUNTER — Ambulatory Visit (INDEPENDENT_AMBULATORY_CARE_PROVIDER_SITE_OTHER): Payer: 59 | Admitting: Acute Care

## 2016-07-04 DIAGNOSIS — T50905A Adverse effect of unspecified drugs, medicaments and biological substances, initial encounter: Secondary | ICD-10-CM | POA: Insufficient documentation

## 2016-07-04 DIAGNOSIS — T887XXA Unspecified adverse effect of drug or medicament, initial encounter: Secondary | ICD-10-CM | POA: Diagnosis not present

## 2016-07-04 HISTORY — DX: Adverse effect of unspecified drugs, medicaments and biological substances, initial encounter: T50.905A

## 2016-07-04 MED ORDER — DOXYCYCLINE HYCLATE 100 MG PO TABS: 100.0000 mg | ORAL_TABLET | Freq: Two times a day (BID) | ORAL | 0 refills | Status: DC

## 2016-07-04 NOTE — Assessment & Plan Note (Addendum)
Right Arm Cellulitis 2/2  Suspected ADR to Prevnar 23 Vaccine given 07/02/2016 Plan: We will treat you with Doxycycline 100 mg twice daily for 1 week. Use sun  block if you are outside. Take probiotic for duration of antibiotic treatment. Take Tylenol or Ibuprofen for pain per manufacturer instructions If you develop tingling in your fingers or if the redness spreads or becomes streaky vs. Improves on the antibiotic, or if the redness spreads around your entire are, please call the office or seek emergency care. We will mark the current area of redness with a marker to make it easier to see if the redness is spreading. We will add the Prevnar 23 as an allergy on your medication list. Please call the office for further follow up as needed. Follow up appointment with Rexene Edison, NP Nov, 10, 2017 as is currently scheduled for follow up of cough.  Please contact office for sooner follow up if symptoms do not improve or worsen or seek emergency care

## 2016-07-04 NOTE — Patient Instructions (Addendum)
Itis nice to meet you today. We will treat you with Doxycycline 100 mg twice daily for 1 week. Use sun  block if you are outside. Take probiotic with antibiotic Take Tylenol of Ibuprofen for pain per manufacturer instructons If you develop tingling in your fingers or if the redness spreads or becomes streaky vs. Improves on the antibiotic, or if the redness spreads around your entire are, please call the office or seek emergency care. We will mark the current area of redness with a marker to make it easier to see if the redness is spreading. We will add the Prevnar 23 as an allergy on your medication list. Please call the office for further follow up as needed. Follow up appointment with Rexene Edison, NP Nov, 10, 2017 as is currently scheduled.  Please contact office for sooner follow up if symptoms do not improve or worsen or seek emergency care

## 2016-07-04 NOTE — Progress Notes (Signed)
History of Present Illness Kyle FRICKER Sr. is a 65 y.o. male with COPD and Asthma , History of Adenocarcinoma of the lung with LUL lobectomy 10/2014. ( T1bNO adeno) He also has chronic leukemia.  Synopsis: Former patient of Dr. Joya Gaskins who has COPD and asthma overlap syndrome. May 2016 simple spirometry showed FEV1 of 54% predicted 1.99 L. He was also diagnosed with adenocarcinoma of the lung and underwent a left upper lobectomy and mediastinal lymph node dissection in February 2016. Pathology was noted to be T1b N0 adenocarcinoma. He also has chronic leukemia.  07/04/2016 Acute OV: Pt. Presents to the office today for suspected reaction to Prevnar 23 vaccine given 07/02/2016 at 10 am in his right arm. He states that his right arm started hurting that night at about 11 pm. He noticed a localized swelling that night at the injection site , but the swelling  spread to the interior aspect of his arm from his shoulder to his elbow by the next morning, and was tender to touch. He states his upper arm it is less swollen today  but remains swollen from the injection site  to the elbow on the inner aspect of his arm. This area  is red, warm and firm, and tender to the touch. It appears to be a cellulitis. He had no lip or airway swelling, no worsening shortness of breath, no worsening of wheezing, no stridor. He has not taken any medication to treat the pain.He had good brachial and radial pulses in his right arm, and Cap refill of <2 seconds. He denies fever, chest pain, orthopnea, wheezing or hemoptysis. I consulted Dr. Lake Bells, the patient's pulmonologist, and we agreed to treatment with Doxycycline 100 mg BID x 7 days. [name] and his wife have been given instructions to call the office or seek emergency medical care  for new fever, chills, tingling in his fingers ,or spreading of the redness and swelling to include the circumference of the arm, streaking, or spread beyond the marked current area of  redness. They both verbalized understanding of the above. I also advised them to get the antibiotic on the way home, and to start them immediately.     Past medical hx Past Medical History:  Diagnosis Date  . Arthritis   . Asthma   . BPH (benign prostatic hyperplasia)   . CAD (coronary artery disease)   . Cholangiocarcinoma of biliary tract ([place])   . Chronic myelogenous leukemia ([place])   . Diabetes mellitus   . DJD (degenerative joint disease)   . Dyslipidemia   . Gout   . HTN (hypertension)   . Hypercholesterolemia   . Lung cancer ([place])   . PAD (peripheral artery disease) ([place])   . Renal insufficiency      Past surgical hx, Family hx, Social hx all reviewed.  Current Outpatient Prescriptions on File Prior to Visit  Medication Sig  . albuterol (PROVENTIL HFA;VENTOLIN HFA) 108 (90 BASE) MCG/ACT inhaler Inhale 2 puffs into the lungs every 6 (six) hours as needed for wheezing or shortness of breath.  . allopurinol (ZYLOPRIM) 300 MG tablet Take 300 mg by mouth daily.  . dasatinib (SPRYCEL) 100 MG tablet Take 100 mg by mouth daily.  . Fluticasone-Salmeterol (ADVAIR DISKUS) 500-50 MCG/DOSE AEPB Inhale 1 puff into the lungs 2 (two) times daily.  . Fluticasone-Salmeterol (ADVAIR DISKUS) 500-50 MCG/DOSE AEPB Inhale 1 puff into the lungs 2 (two) times daily.  . Fluticasone-Salmeterol (ADVAIR) 250-50 MCG/DOSE AEPB Inhale 1 puff into the  lungs 2 (two) times daily.  Kyle Montoya losartan-hydrochlorothiazide (HYZAAR) 100-25 MG tablet Take 1 tablet by mouth daily.  . tamsulosin (FLOMAX) 0.4 MG CAPS capsule Take 0.8 mg by mouth daily.    No current facility-administered medications on file prior to visit.      Allergies  Allergen Reactions  . Pneumococcal Vaccines Swelling    Review Of Systems:  Constitutional:   No  weight loss, night sweats,  Fevers, chills, fatigue, or  lassitude.  HEENT:   No headaches,  Difficulty swallowing,  Tooth/dental problems, or  Sore throat,                No  sneezing, itching, ear ache, nasal congestion, post nasal drip,   CV:  No chest pain,  Orthopnea, PND, swelling in lower extremities, anasarca, dizziness, palpitations, syncope, 2+ brachial and radial pulses, Capillary refill < 2 seconds.Kyle Montoya   GI  No heartburn, indigestion, abdominal pain, nausea, vomiting, diarrhea, change in bowel habits, loss of appetite, bloody stools.   Resp: + shortness of breath with exertion or at rest.  No excess mucus, no productive cough,  + non-productive cough,  No coughing up of blood.  No change in color of mucus.  No wheezing.  No chest wall deformity  Skin: Reddened area, cellulitis appearing from  right shoulder through inner aspect of arm, to inner elbow.  GU: no dysuria, change in color of urine, no urgency or frequency.  No flank pain, no hematuria   MS:  No joint pain or swelling.  No decreased range of motion.  No back pain.  Psych:  No change in mood or affect. No depression or anxiety.  No memory loss.   Vital Signs BP 122/78 (BP Location: Left Arm, Cuff Size: Normal)   Pulse 69   Ht '5\' 11"'$  (1.803 m)   Wt 206 lb (93.4 kg)   SpO2 98%   BMI 28.73 kg/m    Physical Exam:  General- No distress,  A&Ox3, pleasant ENT: No sinus tenderness, TM clear, pale nasal mucosa, no oral exudate,no post nasal drip, no LAN Cardiac: S1, S2, regular rate and rhythm, no murmur Chest: No wheeze/ rales/ dullness; no accessory muscle use, no nasal flaring, no sternal retractions Abd.: Soft Non-tender Ext: No clubbing cyanosis, edema Neuro:  normal strength Skin: +  Reddened  area, cellulitis appearing,  from  right shoulder through inner aspect of arm, to inner elbow. Psych: normal mood and behavior, appropriately concerned about possible allergic reaction to vaccine.   Assessment/Plan  Adverse drug reaction, initial encounter Right Arm Cellulitis 2/2  Suspected ADR to Prevnar 23 Vaccine given 07/02/2016 Plan: We will treat you with Doxycycline 100 mg twice  daily for 1 week. Use sun  block if you are outside. Take probiotic for duration of antibiotic treatment. Take Tylenol or Ibuprofen for pain per manufacturer instructions If you develop tingling in your fingers or if the redness spreads or becomes streaky vs. Improves on the antibiotic, or if the redness spreads around your entire are, please call the office or seek emergency care. We will mark the current area of redness with a marker to make it easier to see if the redness is spreading. We will add the Prevnar 23 as an allergy on your medication list. Please call the office for further follow up as needed. Follow up appointment with Rexene Edison, NP Nov, 10, 2017 as is currently scheduled for follow up of cough.  Please contact office for sooner follow up if symptoms  do not improve or worsen or seek emergency care       Magdalen Spatz, NP 07/04/2016  12:39 PM

## 2016-07-04 NOTE — Progress Notes (Signed)
Reviewed and discussed plan of care with Kyle Montoya in clinic.

## 2016-07-25 DIAGNOSIS — G8929 Other chronic pain: Secondary | ICD-10-CM | POA: Diagnosis not present

## 2016-07-25 DIAGNOSIS — Z79899 Other long term (current) drug therapy: Secondary | ICD-10-CM | POA: Diagnosis not present

## 2016-07-25 DIAGNOSIS — E669 Obesity, unspecified: Secondary | ICD-10-CM | POA: Diagnosis not present

## 2016-07-25 DIAGNOSIS — Z6831 Body mass index (BMI) 31.0-31.9, adult: Secondary | ICD-10-CM | POA: Diagnosis not present

## 2016-08-02 ENCOUNTER — Ambulatory Visit: Payer: 59 | Admitting: Adult Health

## 2016-08-06 ENCOUNTER — Ambulatory Visit (INDEPENDENT_AMBULATORY_CARE_PROVIDER_SITE_OTHER): Payer: 59 | Admitting: Adult Health

## 2016-08-06 ENCOUNTER — Encounter: Payer: Self-pay | Admitting: Adult Health

## 2016-08-06 DIAGNOSIS — R05 Cough: Secondary | ICD-10-CM | POA: Diagnosis not present

## 2016-08-06 DIAGNOSIS — J449 Chronic obstructive pulmonary disease, unspecified: Secondary | ICD-10-CM | POA: Diagnosis not present

## 2016-08-06 DIAGNOSIS — R053 Chronic cough: Secondary | ICD-10-CM

## 2016-08-06 DIAGNOSIS — C3490 Malignant neoplasm of unspecified part of unspecified bronchus or lung: Secondary | ICD-10-CM | POA: Diagnosis not present

## 2016-08-06 NOTE — Progress Notes (Signed)
Agree 

## 2016-08-06 NOTE — Assessment & Plan Note (Signed)
S/p resection in 10/2014  Keep follow up with oncology with CT chest this week in [place]

## 2016-08-06 NOTE — Progress Notes (Signed)
Subjective:    Patient ID: Kyle Montoya., male    DOB: 03/12/1951, 65 y.o.   MRN: [id number]  HPI 65 yo male smoker with COPD /Asthma  Hx of Lung cancer -adenocarcinoma w/p LUL lobectomy and mediastinal lymph node dissection 10/2104  Chronic Leukemia   TEST  Spirometry 2016 FEV1 54%  08/06/2016 Follow up : COPD/Asthma/Smoker  Pt returns for 1 month follow up . Pt with Cough flare last ov felt secondary to ACE inhibitor.  He was changed from Lisinopril to Losartan . He says cough is much better and wheezing has resolved. He continues to smoking , cessation encouraged.  Had Prevnar vaccine , tx for localized reaction that resolved with doxycycline ? Cellulitis .  He has hx of lung cancer s/p resection 2016. Has CT chest and oncology follow up this week.  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  Denies chest pain, orthopnea, edema or fever.  B/p is well controlled today   Past Medical History:  Diagnosis Date  . Arthritis   . Asthma   . BPH (benign prostatic hyperplasia)   . CAD (coronary artery disease)   . Cholangiocarcinoma of biliary tract ([place])   . Chronic myelogenous leukemia ([place])   . Diabetes mellitus   . DJD (degenerative joint disease)   . Dyslipidemia   . Gout   . HTN (hypertension)   . Hypercholesterolemia   . Lung cancer ([place])   . PAD (peripheral artery disease) ([place])   . Renal insufficiency     Current Outpatient Prescriptions on File Prior to Visit  Medication Sig Dispense Refill  . albuterol (PROVENTIL HFA;VENTOLIN HFA) 108 (90 BASE) MCG/ACT inhaler Inhale 2 puffs into the lungs every 6 (six) hours as needed for wheezing or shortness of breath.    . allopurinol (ZYLOPRIM) 300 MG tablet Take 300 mg by mouth daily.    . dasatinib (SPRYCEL) 100 MG tablet Take 100 mg by mouth daily.    . Fluticasone-Salmeterol (ADVAIR DISKUS) 500-50 MCG/DOSE AEPB Inhale 1 puff into the lungs 2 (two) times daily. 14 each 0  . losartan-hydrochlorothiazide (HYZAAR)  100-25 MG tablet Take 1 tablet by mouth daily. 30 tablet 5  . tamsulosin (FLOMAX) 0.4 MG CAPS capsule Take 0.8 mg by mouth daily.      No current facility-administered medications on file prior to visit.      Review of Systems    Constitutional:   No  weight loss, night sweats,  Fevers, chills, fatigue, or  lassitude.  HEENT:   No headaches,  Difficulty swallowing,  Tooth/dental problems, or  Sore throat,                No sneezing, itching, ear ache, nasal congestion, post nasal drip,   CV:  No chest pain,  Orthopnea, PND, swelling in lower extremities, anasarca, dizziness, palpitations, syncope.   GI  No heartburn, indigestion, abdominal pain, nausea, vomiting, diarrhea, change in bowel habits, loss of appetite, bloody stools.   Resp: No shortness of breath with exertion or at rest.  No excess mucus, no productive cough,  No non-productive cough,  No coughing up of blood.  No change in color of mucus.  No wheezing.  No chest wall deformity  Skin: no rash or lesions.  GU: no dysuria, change in color of urine, no urgency or frequency.  No flank pain, no hematuria   MS:  No joint pain or swelling.  No decreased range of motion.  No back pain.  Psych:  No change in mood or affect. No depression or anxiety.  No memory loss.      Objective:   Physical Exam  Vitals:   08/06/16 0908  BP: 122/70  Pulse: 61  Temp: 97.8 F (36.6 C)  TempSrc: Oral  SpO2: 98%  Weight: 208 lb 12.8 oz (94.7 kg)  Height: '5\' 8"'$  (1.727 m)   GEN: A/Ox3; pleasant , NAD, well nourished    HEENT:  [place]/AT,  EACs-clear, TMs-wnl, NOSE-clear, THROAT-clear, no lesions, no postnasal drip or exudate noted.   NECK:  Supple w/ fair ROM; no JVD; normal carotid impulses w/o bruits; no thyromegaly or nodules palpated; no lymphadenopathy.    RESP  Clear  P & A; w/o, wheezes/ rales/ or rhonchi. no accessory muscle use, no dullness to percussion  CARD:  RRR, no m/r/g  , no peripheral edema, pulses intact, no  cyanosis or clubbing.  GI:   Soft & nt; nml bowel sounds; no organomegaly or masses detected.   Musco: Warm bil, no deformities or joint swelling noted.   Neuro: alert, no focal deficits noted.    Skin: Warm, no lesions or rashes  Rayn Shorb NP-C  [facility]  08/06/2016       Assessment & Plan:

## 2016-08-06 NOTE — Patient Instructions (Signed)
Continue on Advair 1 puff Twice daily  , rinse after use Follow up for CT chest this week Keep follow up with Oncology this week as planned.  Follow up with Primary Doctor for ongoing blood pressure management.  Work on not smoking .  Follow up in 6 month with Dr. Lake Bells and As needed   Please contact office for sooner follow up if symptoms do not improve or worsen or seek emergency care

## 2016-08-06 NOTE — Assessment & Plan Note (Signed)
Improved off ACE

## 2016-08-06 NOTE — Assessment & Plan Note (Signed)
Improved control off ACE inhibitor  Would avoid ACE inhibitor going forward if possible  Cont on current regimen  Smoking cessation

## 2016-08-08 DIAGNOSIS — C3412 Malignant neoplasm of upper lobe, left bronchus or lung: Secondary | ICD-10-CM | POA: Diagnosis not present

## 2016-08-14 DIAGNOSIS — Z85118 Personal history of other malignant neoplasm of bronchus and lung: Secondary | ICD-10-CM | POA: Diagnosis not present

## 2016-08-14 DIAGNOSIS — Z8509 Personal history of malignant neoplasm of other digestive organs: Secondary | ICD-10-CM | POA: Diagnosis not present

## 2016-08-14 DIAGNOSIS — Z856 Personal history of leukemia: Secondary | ICD-10-CM | POA: Diagnosis not present

## 2016-08-14 DIAGNOSIS — F17210 Nicotine dependence, cigarettes, uncomplicated: Secondary | ICD-10-CM | POA: Diagnosis not present

## 2016-10-19 ENCOUNTER — Encounter (HOSPITAL_COMMUNITY): Payer: Self-pay

## 2016-10-19 ENCOUNTER — Emergency Department (HOSPITAL_COMMUNITY)
Admission: EM | Admit: 2016-10-19 | Discharge: 2016-10-19 | Disposition: A | Discharge status: Home / Self Care | Payer: 59 | Attending: Emergency Medicine | Admitting: Emergency Medicine

## 2016-10-19 ENCOUNTER — Emergency Department (HOSPITAL_COMMUNITY): Payer: 59

## 2016-10-19 DIAGNOSIS — R05 Cough: Secondary | ICD-10-CM | POA: Diagnosis not present

## 2016-10-19 DIAGNOSIS — I10 Essential (primary) hypertension: Secondary | ICD-10-CM | POA: Diagnosis not present

## 2016-10-19 DIAGNOSIS — I2510 Atherosclerotic heart disease of native coronary artery without angina pectoris: Secondary | ICD-10-CM | POA: Diagnosis not present

## 2016-10-19 DIAGNOSIS — Z85118 Personal history of other malignant neoplasm of bronchus and lung: Secondary | ICD-10-CM | POA: Diagnosis not present

## 2016-10-19 DIAGNOSIS — Z8509 Personal history of malignant neoplasm of other digestive organs: Secondary | ICD-10-CM | POA: Insufficient documentation

## 2016-10-19 DIAGNOSIS — J111 Influenza due to unidentified influenza virus with other respiratory manifestations: Secondary | ICD-10-CM | POA: Diagnosis not present

## 2016-10-19 DIAGNOSIS — J45901 Unspecified asthma with (acute) exacerbation: Secondary | ICD-10-CM | POA: Diagnosis not present

## 2016-10-19 DIAGNOSIS — J449 Chronic obstructive pulmonary disease, unspecified: Secondary | ICD-10-CM | POA: Diagnosis not present

## 2016-10-19 DIAGNOSIS — F17210 Nicotine dependence, cigarettes, uncomplicated: Secondary | ICD-10-CM | POA: Diagnosis not present

## 2016-10-19 DIAGNOSIS — E119 Type 2 diabetes mellitus without complications: Secondary | ICD-10-CM | POA: Diagnosis not present

## 2016-10-19 DIAGNOSIS — Z79899 Other long term (current) drug therapy: Secondary | ICD-10-CM | POA: Diagnosis not present

## 2016-10-19 MED ORDER — PREDNISONE 20 MG PO TABS: 40.0000 mg | ORAL_TABLET | Freq: Every day | ORAL | 0 refills | Status: DC

## 2016-10-19 MED ORDER — OSELTAMIVIR PHOSPHATE 75 MG PO CAPS: 75.0000 mg | ORAL_CAPSULE | Freq: Two times a day (BID) | ORAL | 0 refills | Status: DC

## 2016-10-19 MED ORDER — GUAIFENESIN-DM 100-10 MG/5ML PO SYRP: 5.0000 mL | ORAL_SOLUTION | Freq: Three times a day (TID) | ORAL | 0 refills | Status: DC | PRN

## 2016-10-19 NOTE — ED Triage Notes (Signed)
Patient here with 2 days of cough and congestion, taking antibiotic as directed. Minimal to no fever, alert and oriented

## 2016-10-19 NOTE — ED Notes (Signed)
MD at bedside. 

## 2016-10-19 NOTE — ED Provider Notes (Signed)
[facility] DEPT Provider Note   CSN: [id number] Arrival date & time: 10/19/16  1015     History   Chief Complaint Chief Complaint  Patient presents with  . cough, congestion    HPI Kyle ISHAQ Sr. is a 66 y.o. male.  HPI Patient with cough. Has had it for the last 2-3 days. Started on Augmentin by his primary care doctor. History of asthma. Previous history of lung cancer and CML. Has had reported fevers up to 101. States he's continued to cough. Mild shortness of breath with it. No real chest pain. States he is worried that he could have a pneumonia. No known sick contacts. Has been coughing up some green sputum but states that is not unusual for him.   Past Medical History:  Diagnosis Date  . Arthritis   . Asthma   . BPH (benign prostatic hyperplasia)   . CAD (coronary artery disease)   . Cholangiocarcinoma of biliary tract ([place])   . Chronic myelogenous leukemia ([place])   . Diabetes mellitus   . DJD (degenerative joint disease)   . Dyslipidemia   . Gout   . HTN (hypertension)   . Hypercholesterolemia   . Lung cancer ([place])   . PAD (peripheral artery disease) ([place])   . Renal insufficiency     Patient Active Problem List   Diagnosis Date Noted  . Adverse drug reaction, initial encounter 07/04/2016  . Adult BMI 30+ 01/31/2015  . Benign fibroma of prostate 01/31/2015  . Cataract 01/31/2015  . Chronic cough 01/31/2015  . Chronic pain 01/31/2015  . Arthritis, degenerative 01/31/2015  . HLD (hyperlipidemia) 01/31/2015  . Cannot sleep 01/31/2015  . Non-small cell carcinoma of lung ([place]) 01/31/2015  . Compulsive tobacco user syndrome 01/31/2015  . Wheezing 01/31/2015  . CGL (chronic granulocytic leukemia) ([place]) 10/18/2014  . Gout 10/18/2014  . Cervical osteoarthritis 10/18/2014  . Carcinoma of biliary tract ([place]) 02/15/2013  . Anemia in chronic illness 01/22/2013  . COPD with asthma ([place]) 01/22/2013  . BP (high blood pressure) 01/22/2013    Past Surgical  History:  Procedure Laterality Date  . Bowel duct surg  2014   Bowel cancer  . LUNG CANCER SURGERY  Jan. 2016   partial lobectomy       Home Medications    Prior to Admission medications   Medication Sig Start Date End Date Taking? Authorizing Provider  albuterol (PROVENTIL HFA;VENTOLIN HFA) 108 (90 BASE) MCG/ACT inhaler Inhale 2 puffs into the lungs every 6 (six) hours as needed for wheezing or shortness of breath.    Historical Provider, MD  allopurinol (ZYLOPRIM) 300 MG tablet Take 300 mg by mouth daily.    Historical Provider, MD  dasatinib (SPRYCEL) 100 MG tablet Take 100 mg by mouth daily.    Historical Provider, MD  Fluticasone-Salmeterol (ADVAIR DISKUS) 500-50 MCG/DOSE AEPB Inhale 1 puff into the lungs 2 (two) times daily. 07/02/16 08/06/16  [name], MD  guaiFENesin-dextromethorphan (ROBITUSSIN DM) 100-10 MG/5ML syrup Take 5 mLs by mouth 3 (three) times daily as needed for cough. 10/19/16   Davonna Belling, MD  losartan-hydrochlorothiazide (HYZAAR) 100-25 MG tablet Take 1 tablet by mouth daily. 07/02/16   [name], MD  oseltamivir (TAMIFLU) 75 MG capsule Take 1 capsule (75 mg total) by mouth every 12 (twelve) hours. 10/19/16   Davonna Belling, MD  predniSONE (DELTASONE) 20 MG tablet Take 2 tablets (40 mg total) by mouth daily. 10/19/16   Davonna Belling, MD  tamsulosin (FLOMAX) 0.4 MG CAPS  capsule Take 0.8 mg by mouth daily.     Historical Provider, MD    Family History Family History  Problem Relation Age of Onset  . Prostate cancer Father 75    deceased  . Hypertension Mother   . Hypertension Brother   . Hypertension Sister   . Asthma Sister     Social History Social History  Substance Use Topics  . Smoking status: Current Every Day Smoker    Packs/day: 0.25    Years: 40.00    Types: Cigarettes  . Smokeless tobacco: Never Used     Comment: smokes 1-2 cigarettes at night when drinking 07/02/16  . Alcohol use 3.6 oz/week    6 Standard drinks  or equivalent per week     Allergies   Pneumococcal vaccines   Review of Systems Review of Systems  Constitutional: Negative for appetite change.  HENT: Positive for sore throat. Negative for congestion.   Eyes: Negative for pain.  Respiratory: Positive for cough, shortness of breath and wheezing.   Gastrointestinal: Negative for abdominal pain.  Genitourinary: Negative for dysuria.  Musculoskeletal: Positive for myalgias. Negative for back pain.  Neurological: Negative for syncope.  Hematological: Negative for adenopathy.  Psychiatric/Behavioral: Negative for confusion.     Physical Exam Updated Vital Signs BP 109/78   Pulse 84   Temp 98.4 F (36.9 C) (Oral)   Resp 16   Ht '5\' 8"'$  (1.727 m)   Wt 208 lb (94.3 kg)   SpO2 98%   BMI 31.63 kg/m   Physical Exam  Constitutional: He appears well-developed.  HENT:  Head: Atraumatic.  Eyes: Pupils are equal, round, and reactive to light.  Neck: Neck supple.  Cardiovascular: Normal rate.   Pulmonary/Chest:  Mild diffuse wheezes with prolonged expirations.  Abdominal: Soft. There is no tenderness.  Musculoskeletal: He exhibits no edema.  Neurological: He is alert.  Skin: Skin is warm. Capillary refill takes less than 2 seconds.  Psychiatric: He has a normal mood and affect.     ED Treatments / Results  Labs (all labs ordered are listed, but only abnormal results are displayed) Labs Reviewed - No data to display  EKG  EKG Interpretation None       Radiology Dg Chest 2 View  Result Date: 10/19/2016 CLINICAL DATA:  Cough, congestion EXAM: CHEST  2 VIEW COMPARISON:  None. FINDINGS: Heart and mediastinal contours are within normal limits. No focal opacities or effusions. No acute bony abnormality. IMPRESSION: No active cardiopulmonary disease. Electronically Signed   By: Rolm Baptise M.D.   On: 10/19/2016 11:03    Procedures Procedures (including critical care time)  Medications Ordered in ED Medications - No  data to display   Initial Impression / Assessment and Plan / ED Course  I have reviewed the triage vital signs and the nursing notes.  Pertinent labs & imaging results that were available during my care of the patient were reviewed by me and considered in my medical decision making (see chart for details).     Patient with cough for last few days. Report fevers up to 101. With patient's comorbidity will empirically treat for flu. Already on antibiotics at primary care. With the wheezing will give steroids. Will discharge home to follow-up with primary care or pulmonologist needed.  Final Clinical Impressions(s) / ED Diagnoses   Final diagnoses:  Influenza  Asthma with acute exacerbation, unspecified asthma severity, unspecified whether persistent    New Prescriptions New Prescriptions   GUAIFENESIN-DEXTROMETHORPHAN (ROBITUSSIN DM) 100-10 MG/5ML  SYRUP    Take 5 mLs by mouth 3 (three) times daily as needed for cough.   OSELTAMIVIR (TAMIFLU) 75 MG CAPSULE    Take 1 capsule (75 mg total) by mouth every 12 (twelve) hours.   PREDNISONE (DELTASONE) 20 MG TABLET    Take 2 tablets (40 mg total) by mouth daily.     Davonna Belling, MD 10/19/16 [id number]

## 2016-10-25 DIAGNOSIS — G8929 Other chronic pain: Secondary | ICD-10-CM | POA: Diagnosis not present

## 2016-10-25 DIAGNOSIS — Z1389 Encounter for screening for other disorder: Secondary | ICD-10-CM | POA: Diagnosis not present

## 2016-10-25 DIAGNOSIS — Z9181 History of falling: Secondary | ICD-10-CM | POA: Diagnosis not present

## 2016-10-25 DIAGNOSIS — R05 Cough: Secondary | ICD-10-CM | POA: Diagnosis not present

## 2016-10-25 DIAGNOSIS — Z6832 Body mass index (BMI) 32.0-32.9, adult: Secondary | ICD-10-CM | POA: Diagnosis not present

## 2016-10-25 DIAGNOSIS — E669 Obesity, unspecified: Secondary | ICD-10-CM | POA: Diagnosis not present

## 2016-11-05 DIAGNOSIS — E669 Obesity, unspecified: Secondary | ICD-10-CM | POA: Diagnosis not present

## 2016-11-05 DIAGNOSIS — R05 Cough: Secondary | ICD-10-CM | POA: Diagnosis not present

## 2016-11-05 DIAGNOSIS — Z6832 Body mass index (BMI) 32.0-32.9, adult: Secondary | ICD-10-CM | POA: Diagnosis not present

## 2016-11-13 DIAGNOSIS — R972 Elevated prostate specific antigen [PSA]: Secondary | ICD-10-CM | POA: Diagnosis not present

## 2016-11-13 DIAGNOSIS — N401 Enlarged prostate with lower urinary tract symptoms: Secondary | ICD-10-CM | POA: Diagnosis not present

## 2016-11-13 DIAGNOSIS — N402 Nodular prostate without lower urinary tract symptoms: Secondary | ICD-10-CM | POA: Diagnosis not present

## 2016-11-14 DIAGNOSIS — C9210 Chronic myeloid leukemia, BCR/ABL-positive, not having achieved remission: Secondary | ICD-10-CM | POA: Diagnosis not present

## 2016-11-14 DIAGNOSIS — Z85118 Personal history of other malignant neoplasm of bronchus and lung: Secondary | ICD-10-CM | POA: Diagnosis not present

## 2016-11-14 DIAGNOSIS — Z720 Tobacco use: Secondary | ICD-10-CM | POA: Diagnosis not present

## 2016-11-14 DIAGNOSIS — Z8509 Personal history of malignant neoplasm of other digestive organs: Secondary | ICD-10-CM | POA: Diagnosis not present

## 2016-11-14 DIAGNOSIS — C9211 Chronic myeloid leukemia, BCR/ABL-positive, in remission: Secondary | ICD-10-CM | POA: Diagnosis not present

## 2017-01-03 ENCOUNTER — Other Ambulatory Visit: Payer: Self-pay | Admitting: Pulmonary Disease

## 2017-02-12 DIAGNOSIS — Z8509 Personal history of malignant neoplasm of other digestive organs: Secondary | ICD-10-CM | POA: Diagnosis not present

## 2017-02-12 DIAGNOSIS — Z85118 Personal history of other malignant neoplasm of bronchus and lung: Secondary | ICD-10-CM | POA: Diagnosis not present

## 2017-02-12 DIAGNOSIS — C9211 Chronic myeloid leukemia, BCR/ABL-positive, in remission: Secondary | ICD-10-CM | POA: Diagnosis not present

## 2017-02-12 DIAGNOSIS — F17210 Nicotine dependence, cigarettes, uncomplicated: Secondary | ICD-10-CM | POA: Diagnosis not present

## 2017-02-13 DIAGNOSIS — E785 Hyperlipidemia, unspecified: Secondary | ICD-10-CM | POA: Diagnosis not present

## 2017-02-13 DIAGNOSIS — C9210 Chronic myeloid leukemia, BCR/ABL-positive, not having achieved remission: Secondary | ICD-10-CM | POA: Diagnosis not present

## 2017-02-13 DIAGNOSIS — I10 Essential (primary) hypertension: Secondary | ICD-10-CM | POA: Diagnosis not present

## 2017-02-13 DIAGNOSIS — M1990 Unspecified osteoarthritis, unspecified site: Secondary | ICD-10-CM | POA: Diagnosis not present

## 2017-02-13 DIAGNOSIS — Z79899 Other long term (current) drug therapy: Secondary | ICD-10-CM | POA: Diagnosis not present

## 2017-02-13 DIAGNOSIS — Z6831 Body mass index (BMI) 31.0-31.9, adult: Secondary | ICD-10-CM | POA: Diagnosis not present

## 2017-02-13 DIAGNOSIS — G8929 Other chronic pain: Secondary | ICD-10-CM | POA: Diagnosis not present

## 2017-03-07 DIAGNOSIS — M4696 Unspecified inflammatory spondylopathy, lumbar region: Secondary | ICD-10-CM | POA: Diagnosis not present

## 2017-03-07 DIAGNOSIS — M545 Low back pain: Secondary | ICD-10-CM | POA: Diagnosis not present

## 2017-03-31 DIAGNOSIS — M47816 Spondylosis without myelopathy or radiculopathy, lumbar region: Secondary | ICD-10-CM | POA: Diagnosis not present

## 2017-04-10 DIAGNOSIS — M47816 Spondylosis without myelopathy or radiculopathy, lumbar region: Secondary | ICD-10-CM | POA: Diagnosis not present

## 2017-04-17 DIAGNOSIS — G8929 Other chronic pain: Secondary | ICD-10-CM | POA: Diagnosis not present

## 2017-04-17 DIAGNOSIS — E669 Obesity, unspecified: Secondary | ICD-10-CM | POA: Diagnosis not present

## 2017-04-17 DIAGNOSIS — L0291 Cutaneous abscess, unspecified: Secondary | ICD-10-CM | POA: Diagnosis not present

## 2017-04-17 DIAGNOSIS — Z6831 Body mass index (BMI) 31.0-31.9, adult: Secondary | ICD-10-CM | POA: Diagnosis not present

## 2017-05-13 DIAGNOSIS — R972 Elevated prostate specific antigen [PSA]: Secondary | ICD-10-CM | POA: Diagnosis not present

## 2017-05-13 DIAGNOSIS — N401 Enlarged prostate with lower urinary tract symptoms: Secondary | ICD-10-CM | POA: Diagnosis not present

## 2017-05-15 DIAGNOSIS — C9210 Chronic myeloid leukemia, BCR/ABL-positive, not having achieved remission: Secondary | ICD-10-CM | POA: Diagnosis not present

## 2017-05-15 DIAGNOSIS — Z8509 Personal history of malignant neoplasm of other digestive organs: Secondary | ICD-10-CM | POA: Diagnosis not present

## 2017-05-15 DIAGNOSIS — Z85118 Personal history of other malignant neoplasm of bronchus and lung: Secondary | ICD-10-CM | POA: Diagnosis not present

## 2017-05-15 DIAGNOSIS — C9211 Chronic myeloid leukemia, BCR/ABL-positive, in remission: Secondary | ICD-10-CM | POA: Diagnosis not present

## 2017-05-15 DIAGNOSIS — Z720 Tobacco use: Secondary | ICD-10-CM | POA: Diagnosis not present

## 2017-05-30 DIAGNOSIS — C9210 Chronic myeloid leukemia, BCR/ABL-positive, not having achieved remission: Secondary | ICD-10-CM | POA: Diagnosis not present

## 2017-06-06 DIAGNOSIS — G8929 Other chronic pain: Secondary | ICD-10-CM | POA: Diagnosis not present

## 2017-06-06 DIAGNOSIS — E669 Obesity, unspecified: Secondary | ICD-10-CM | POA: Diagnosis not present

## 2017-06-06 DIAGNOSIS — Z6831 Body mass index (BMI) 31.0-31.9, adult: Secondary | ICD-10-CM | POA: Diagnosis not present

## 2017-06-06 DIAGNOSIS — Z1389 Encounter for screening for other disorder: Secondary | ICD-10-CM | POA: Diagnosis not present

## 2017-06-06 DIAGNOSIS — E785 Hyperlipidemia, unspecified: Secondary | ICD-10-CM | POA: Diagnosis not present

## 2017-06-06 DIAGNOSIS — Z0000 Encounter for general adult medical examination without abnormal findings: Secondary | ICD-10-CM | POA: Diagnosis not present

## 2017-06-06 DIAGNOSIS — M109 Gout, unspecified: Secondary | ICD-10-CM | POA: Diagnosis not present

## 2017-06-06 DIAGNOSIS — Z79899 Other long term (current) drug therapy: Secondary | ICD-10-CM | POA: Diagnosis not present

## 2017-06-06 DIAGNOSIS — Z23 Encounter for immunization: Secondary | ICD-10-CM | POA: Diagnosis not present

## 2017-06-06 DIAGNOSIS — Z9181 History of falling: Secondary | ICD-10-CM | POA: Diagnosis not present

## 2017-06-06 DIAGNOSIS — Z136 Encounter for screening for cardiovascular disorders: Secondary | ICD-10-CM | POA: Diagnosis not present

## 2017-06-06 DIAGNOSIS — I10 Essential (primary) hypertension: Secondary | ICD-10-CM | POA: Diagnosis not present

## 2017-06-18 IMAGING — CR DG CHEST 2V
2 series · 2 of 2 positions shown · non-contrast
Comparison: None.

CLINICAL DATA: Cough, congestion

EXAM:
CHEST  2 VIEW

[chest pa]
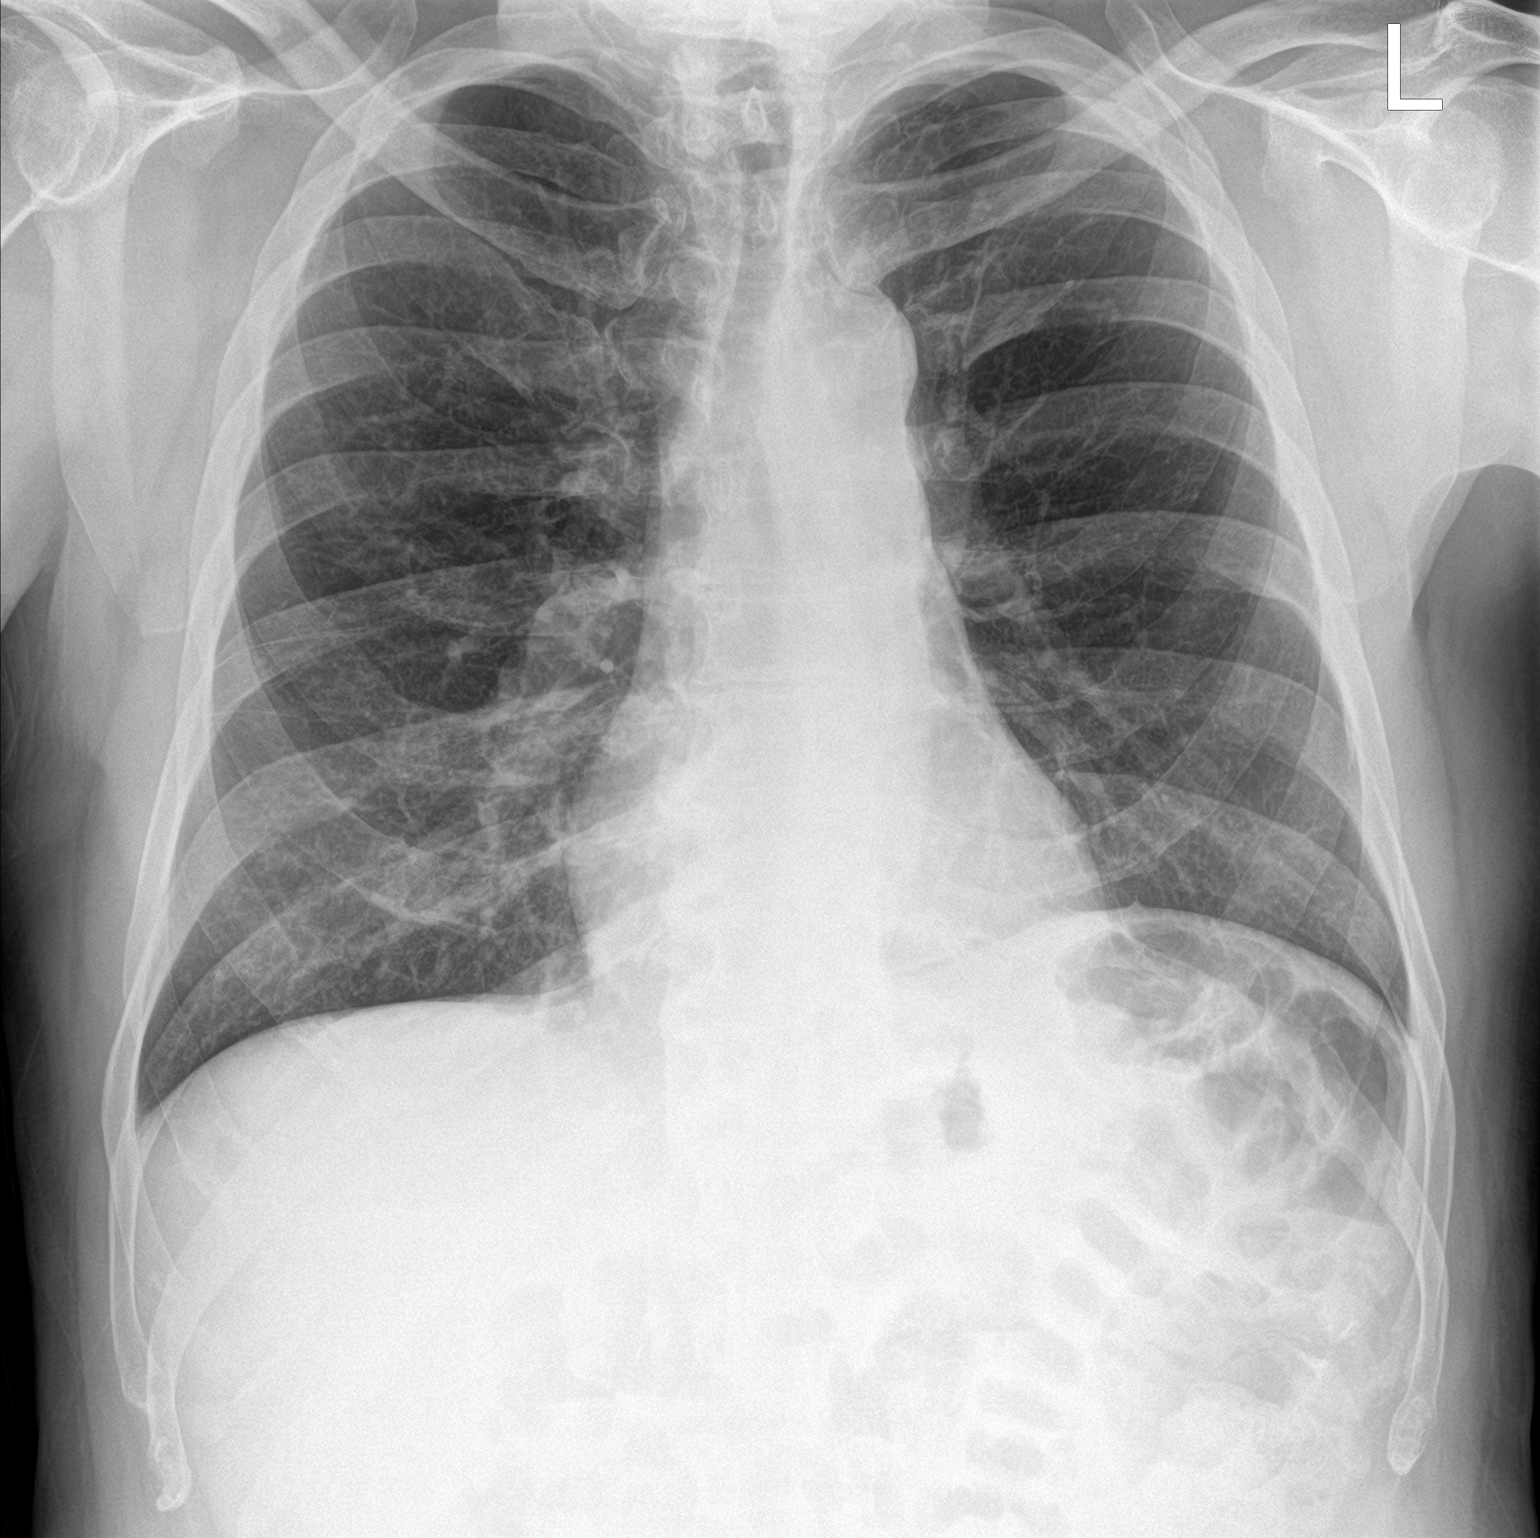

[chest lat]
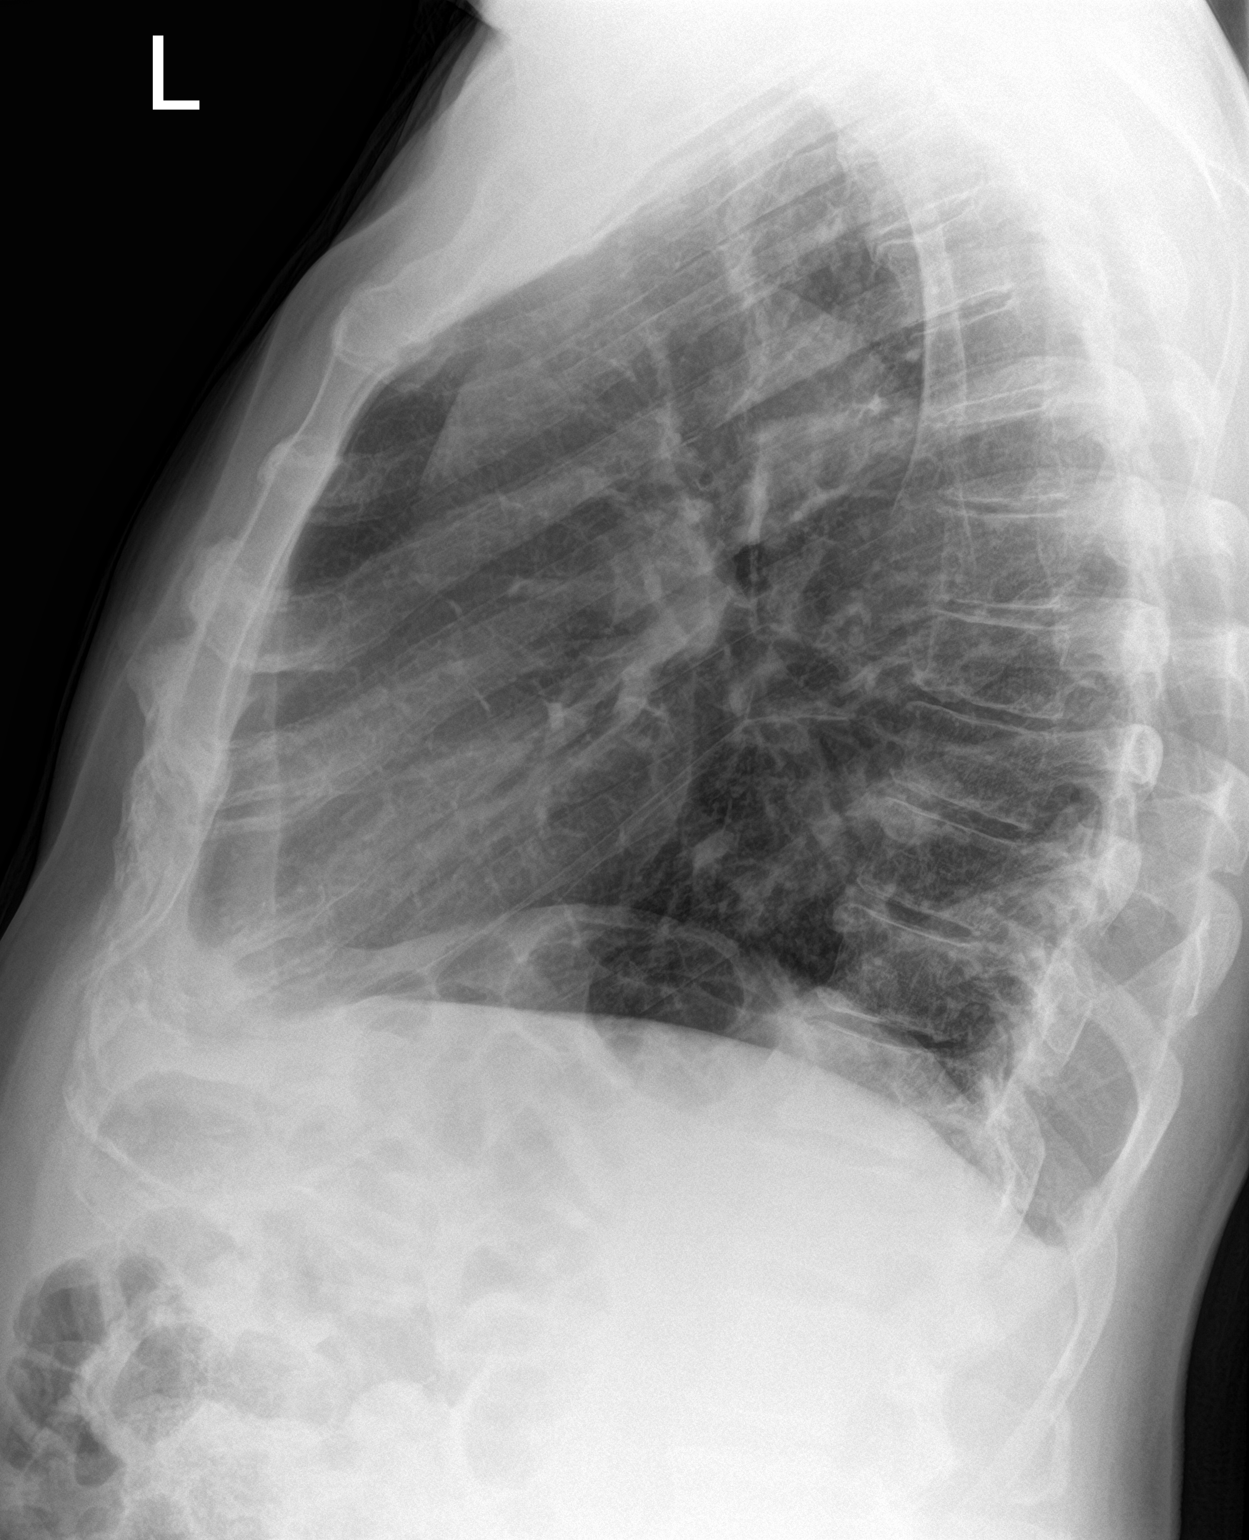

[2 of 2 positions shown; findings below may reference images not displayed]

FINDINGS: Heart and mediastinal contours are within normal limits. No focal
opacities or effusions. No acute bony abnormality.
IMPRESSION: No active cardiopulmonary disease.

## 2017-06-30 ENCOUNTER — Other Ambulatory Visit: Payer: Self-pay | Admitting: Pulmonary Disease

## 2017-07-10 DIAGNOSIS — Z23 Encounter for immunization: Secondary | ICD-10-CM | POA: Diagnosis not present

## 2017-08-01 DIAGNOSIS — J90 Pleural effusion, not elsewhere classified: Secondary | ICD-10-CM | POA: Diagnosis not present

## 2017-08-01 DIAGNOSIS — C3412 Malignant neoplasm of upper lobe, left bronchus or lung: Secondary | ICD-10-CM | POA: Diagnosis not present

## 2017-08-01 DIAGNOSIS — E279 Disorder of adrenal gland, unspecified: Secondary | ICD-10-CM | POA: Diagnosis not present

## 2017-08-01 DIAGNOSIS — C9210 Chronic myeloid leukemia, BCR/ABL-positive, not having achieved remission: Secondary | ICD-10-CM | POA: Diagnosis not present

## 2017-08-04 DIAGNOSIS — Z720 Tobacco use: Secondary | ICD-10-CM | POA: Diagnosis not present

## 2017-08-04 DIAGNOSIS — Z85118 Personal history of other malignant neoplasm of bronchus and lung: Secondary | ICD-10-CM | POA: Diagnosis not present

## 2017-08-04 DIAGNOSIS — F17210 Nicotine dependence, cigarettes, uncomplicated: Secondary | ICD-10-CM | POA: Diagnosis not present

## 2017-08-04 DIAGNOSIS — C9211 Chronic myeloid leukemia, BCR/ABL-positive, in remission: Secondary | ICD-10-CM | POA: Diagnosis not present

## 2017-08-04 DIAGNOSIS — Z8509 Personal history of malignant neoplasm of other digestive organs: Secondary | ICD-10-CM | POA: Diagnosis not present

## 2017-08-12 DIAGNOSIS — C9211 Chronic myeloid leukemia, BCR/ABL-positive, in remission: Secondary | ICD-10-CM | POA: Diagnosis not present

## 2017-10-08 ENCOUNTER — Other Ambulatory Visit: Payer: Self-pay | Admitting: Pulmonary Disease

## 2017-11-06 DIAGNOSIS — C9210 Chronic myeloid leukemia, BCR/ABL-positive, not having achieved remission: Secondary | ICD-10-CM | POA: Diagnosis not present

## 2017-12-31 DIAGNOSIS — N401 Enlarged prostate with lower urinary tract symptoms: Secondary | ICD-10-CM | POA: Diagnosis not present

## 2017-12-31 DIAGNOSIS — E785 Hyperlipidemia, unspecified: Secondary | ICD-10-CM | POA: Diagnosis not present

## 2017-12-31 DIAGNOSIS — I10 Essential (primary) hypertension: Secondary | ICD-10-CM | POA: Diagnosis not present

## 2017-12-31 DIAGNOSIS — G8929 Other chronic pain: Secondary | ICD-10-CM | POA: Diagnosis not present

## 2017-12-31 DIAGNOSIS — R351 Nocturia: Secondary | ICD-10-CM | POA: Diagnosis not present

## 2017-12-31 DIAGNOSIS — M109 Gout, unspecified: Secondary | ICD-10-CM | POA: Diagnosis not present

## 2017-12-31 DIAGNOSIS — R972 Elevated prostate specific antigen [PSA]: Secondary | ICD-10-CM | POA: Diagnosis not present

## 2018-01-27 DIAGNOSIS — M2550 Pain in unspecified joint: Secondary | ICD-10-CM | POA: Diagnosis not present

## 2018-01-27 DIAGNOSIS — Z6832 Body mass index (BMI) 32.0-32.9, adult: Secondary | ICD-10-CM | POA: Diagnosis not present

## 2018-02-04 DIAGNOSIS — C9210 Chronic myeloid leukemia, BCR/ABL-positive, not having achieved remission: Secondary | ICD-10-CM | POA: Diagnosis not present

## 2018-02-04 DIAGNOSIS — Z85118 Personal history of other malignant neoplasm of bronchus and lung: Secondary | ICD-10-CM | POA: Diagnosis not present

## 2018-02-04 DIAGNOSIS — C9211 Chronic myeloid leukemia, BCR/ABL-positive, in remission: Secondary | ICD-10-CM | POA: Diagnosis not present

## 2018-02-04 DIAGNOSIS — Z8509 Personal history of malignant neoplasm of other digestive organs: Secondary | ICD-10-CM | POA: Diagnosis not present

## 2018-02-04 DIAGNOSIS — Z79899 Other long term (current) drug therapy: Secondary | ICD-10-CM | POA: Diagnosis not present

## 2018-02-04 DIAGNOSIS — Z720 Tobacco use: Secondary | ICD-10-CM | POA: Diagnosis not present

## 2018-03-22 DIAGNOSIS — J00 Acute nasopharyngitis [common cold]: Secondary | ICD-10-CM | POA: Diagnosis not present

## 2018-03-22 DIAGNOSIS — J209 Acute bronchitis, unspecified: Secondary | ICD-10-CM | POA: Diagnosis not present

## 2018-03-22 DIAGNOSIS — J45901 Unspecified asthma with (acute) exacerbation: Secondary | ICD-10-CM | POA: Diagnosis not present

## 2018-04-07 DIAGNOSIS — Z6831 Body mass index (BMI) 31.0-31.9, adult: Secondary | ICD-10-CM | POA: Diagnosis not present

## 2018-04-07 DIAGNOSIS — Z79899 Other long term (current) drug therapy: Secondary | ICD-10-CM | POA: Diagnosis not present

## 2018-04-07 DIAGNOSIS — G8929 Other chronic pain: Secondary | ICD-10-CM | POA: Diagnosis not present

## 2018-05-06 DIAGNOSIS — Z8505 Personal history of malignant neoplasm of liver: Secondary | ICD-10-CM | POA: Diagnosis not present

## 2018-05-06 DIAGNOSIS — C9210 Chronic myeloid leukemia, BCR/ABL-positive, not having achieved remission: Secondary | ICD-10-CM | POA: Diagnosis not present

## 2018-05-06 DIAGNOSIS — Z8509 Personal history of malignant neoplasm of other digestive organs: Secondary | ICD-10-CM | POA: Diagnosis not present

## 2018-05-06 DIAGNOSIS — J45909 Unspecified asthma, uncomplicated: Secondary | ICD-10-CM | POA: Diagnosis not present

## 2018-05-06 DIAGNOSIS — Z85118 Personal history of other malignant neoplasm of bronchus and lung: Secondary | ICD-10-CM | POA: Diagnosis not present

## 2018-05-06 DIAGNOSIS — F17210 Nicotine dependence, cigarettes, uncomplicated: Secondary | ICD-10-CM | POA: Diagnosis not present

## 2018-05-06 DIAGNOSIS — Z79899 Other long term (current) drug therapy: Secondary | ICD-10-CM | POA: Diagnosis not present

## 2018-05-06 DIAGNOSIS — Z720 Tobacco use: Secondary | ICD-10-CM | POA: Diagnosis not present

## 2018-05-06 DIAGNOSIS — C3412 Malignant neoplasm of upper lobe, left bronchus or lung: Secondary | ICD-10-CM | POA: Diagnosis not present

## 2018-05-06 DIAGNOSIS — C9211 Chronic myeloid leukemia, BCR/ABL-positive, in remission: Secondary | ICD-10-CM | POA: Diagnosis not present

## 2018-07-04 DIAGNOSIS — M109 Gout, unspecified: Secondary | ICD-10-CM | POA: Diagnosis not present

## 2018-07-08 DIAGNOSIS — Z9181 History of falling: Secondary | ICD-10-CM | POA: Diagnosis not present

## 2018-07-08 DIAGNOSIS — Z6831 Body mass index (BMI) 31.0-31.9, adult: Secondary | ICD-10-CM | POA: Diagnosis not present

## 2018-07-08 DIAGNOSIS — Z79899 Other long term (current) drug therapy: Secondary | ICD-10-CM | POA: Diagnosis not present

## 2018-07-08 DIAGNOSIS — Z23 Encounter for immunization: Secondary | ICD-10-CM | POA: Diagnosis not present

## 2018-07-08 DIAGNOSIS — G8929 Other chronic pain: Secondary | ICD-10-CM | POA: Diagnosis not present

## 2018-07-09 DIAGNOSIS — N401 Enlarged prostate with lower urinary tract symptoms: Secondary | ICD-10-CM | POA: Diagnosis not present

## 2018-07-09 DIAGNOSIS — R351 Nocturia: Secondary | ICD-10-CM | POA: Diagnosis not present

## 2018-08-05 DIAGNOSIS — C9210 Chronic myeloid leukemia, BCR/ABL-positive, not having achieved remission: Secondary | ICD-10-CM | POA: Diagnosis not present

## 2018-08-05 DIAGNOSIS — Z85118 Personal history of other malignant neoplasm of bronchus and lung: Secondary | ICD-10-CM | POA: Diagnosis not present

## 2018-08-05 DIAGNOSIS — Z9889 Other specified postprocedural states: Secondary | ICD-10-CM | POA: Diagnosis not present

## 2018-08-05 DIAGNOSIS — C3412 Malignant neoplasm of upper lobe, left bronchus or lung: Secondary | ICD-10-CM | POA: Diagnosis not present

## 2018-08-06 DIAGNOSIS — Z79899 Other long term (current) drug therapy: Secondary | ICD-10-CM | POA: Diagnosis not present

## 2018-08-06 DIAGNOSIS — F17210 Nicotine dependence, cigarettes, uncomplicated: Secondary | ICD-10-CM

## 2018-08-06 DIAGNOSIS — Z8509 Personal history of malignant neoplasm of other digestive organs: Secondary | ICD-10-CM | POA: Diagnosis not present

## 2018-08-06 DIAGNOSIS — C9210 Chronic myeloid leukemia, BCR/ABL-positive, not having achieved remission: Secondary | ICD-10-CM | POA: Diagnosis not present

## 2018-08-06 DIAGNOSIS — C9211 Chronic myeloid leukemia, BCR/ABL-positive, in remission: Secondary | ICD-10-CM | POA: Diagnosis not present

## 2018-08-06 DIAGNOSIS — Z8589 Personal history of malignant neoplasm of other organs and systems: Secondary | ICD-10-CM | POA: Diagnosis not present

## 2018-08-06 DIAGNOSIS — Z85118 Personal history of other malignant neoplasm of bronchus and lung: Secondary | ICD-10-CM | POA: Diagnosis not present

## 2018-10-14 DIAGNOSIS — J111 Influenza due to unidentified influenza virus with other respiratory manifestations: Secondary | ICD-10-CM | POA: Diagnosis not present

## 2018-11-06 DIAGNOSIS — Z85118 Personal history of other malignant neoplasm of bronchus and lung: Secondary | ICD-10-CM | POA: Diagnosis not present

## 2018-11-06 DIAGNOSIS — R109 Unspecified abdominal pain: Secondary | ICD-10-CM | POA: Diagnosis not present

## 2018-11-06 DIAGNOSIS — F17210 Nicotine dependence, cigarettes, uncomplicated: Secondary | ICD-10-CM | POA: Diagnosis not present

## 2018-11-06 DIAGNOSIS — D708 Other neutropenia: Secondary | ICD-10-CM | POA: Diagnosis not present

## 2018-11-06 DIAGNOSIS — C9211 Chronic myeloid leukemia, BCR/ABL-positive, in remission: Secondary | ICD-10-CM | POA: Diagnosis not present

## 2018-11-14 DIAGNOSIS — C9210 Chronic myeloid leukemia, BCR/ABL-positive, not having achieved remission: Secondary | ICD-10-CM | POA: Diagnosis not present

## 2018-12-04 DIAGNOSIS — C3412 Malignant neoplasm of upper lobe, left bronchus or lung: Secondary | ICD-10-CM | POA: Diagnosis not present

## 2018-12-08 ENCOUNTER — Other Ambulatory Visit: Payer: Self-pay

## 2018-12-08 ENCOUNTER — Ambulatory Visit (INDEPENDENT_AMBULATORY_CARE_PROVIDER_SITE_OTHER): Payer: 59 | Admitting: Gastroenterology

## 2018-12-08 VITALS — BP 158/90 | HR 69 | Temp 98.1°F | Ht 70.0 in | Wt 215.4 lb

## 2018-12-08 DIAGNOSIS — R1013 Epigastric pain: Secondary | ICD-10-CM

## 2018-12-08 MED ORDER — PANTOPRAZOLE SODIUM 40 MG PO TBEC: 40.0000 mg | DELAYED_RELEASE_TABLET | Freq: Every day | ORAL | 11 refills | Status: DC

## 2018-12-08 NOTE — Patient Instructions (Signed)
If you are age 69 or older, your body mass index should be between 23-30. Your Body mass index is 30.9 kg/m. If this is out of the aforementioned range listed, please consider follow up with your Primary Care Provider.  If you are age 77 or younger, your body mass index should be between 19-25. Your Body mass index is 30.9 kg/m. If this is out of the aformentioned range listed, please consider follow up with your Primary Care Provider.   .We have sent the following medications to your pharmacy for you to pick up at your convenience: Protonix 40 mg   Thank you,  Dr. [name]

## 2018-12-08 NOTE — Progress Notes (Signed)
Chief Complaint:   Referring Provider:  Daphene Jaeger, PA-C      ASSESSMENT AND PLAN;   #1. Epi pain  D/d PUD, GERD, gastritis, nonulcer dyspepsia, gastroparesis, musculoskeletal etiology, r/o gallbladder or pancreatic problems.  #2.  History of cholangiocarcinoma (stage 1A) S/P Whipple procedure at [facility] 2014 (Dr Vernard Gambles).  No evidence of recurrence.  Most recent CT chest abdomen/pelvis at [facility] (per patient, report awaited).  Plan: -Protonix 40mg  po qd #30, 11 refills. -Please obtain previous records (blood work last week) from Dr. Remi Deter office. -Please obtain last CT Jan 2020.  -Proceed with EGD if not better.  At this point in time, due to coronavirus scare, he would like to hold off.  He will let us know if he continues to have problems or when he decides. - Recall colon 05/2020 with 2 day prep (had poor preparation during last colonoscopy)   HPI:    Kyle Hong Sr. is a 68 y.o. male  With epigastric pain x 3 to 6 months Better with TUMs Gets exacerbated after eating especially spicy food. Denies having any heartburn. No nausea/vomiting. No recent weight loss.  Continues to smoke despite medical advice.  No nonsteroidals.  Stopped Protonix due to unclear reasons.  Even patient did not know why.  Initially thought his dad had colon cancer.  Now it is clear, he had prostate cancer.  No melena or hematochezia.  No fever or chills.   Seen by Dr. Percell Locus blood work done last week which according to the patient was normal.  Also had CT chest abdomen and pelvis in January at [facility].   Past Medical History:  Diagnosis Date  . Arthritis   . Asthma   . BPH (benign prostatic hyperplasia)   . CAD (coronary artery disease)   . Cholangiocarcinoma of biliary tract ([place])   . Chronic myelogenous leukemia ([place])   . Diabetes mellitus   . DJD (degenerative joint disease)   . Dyslipidemia   . Family history of colonic polyps   . Gout    . HTN (hypertension)   . Hypercholesterolemia   . Lung cancer ([place])   . Myelogenous leukemia ([place])   . OA (osteoarthritis)   . PAD (peripheral artery disease) ([place])   . Renal insufficiency   . Tendonitis    patellar    Past Surgical History:  Procedure Laterality Date  . APPENDECTOMY    . Bowel duct surg  2014   Bowel cancer  . CARDIAC CATHETERIZATION  2000  . COLONOSCOPY  06/23/2015   mild sigmoid diverticulosis. small external and internal hemorrhoids. otherwise normal colonoscopy to terminal ileum  . LUNG CANCER SURGERY  Jan. 2016   partial lobectomy  . wipple procedure     stage I- due to bile duct cancer-2014 at [facility] (Dr Vernard Gambles). Stage IA cholangiocarcinoma    Family History  Problem Relation Age of Onset  . Prostate cancer Father 33       deceased  . Hypertension Mother   . Hypertension Brother   . Hypertension Sister   . Asthma Sister   . Colon cancer Neg Hx   . Esophageal cancer Neg Hx     Social History   Tobacco Use  . Smoking status: Current Every Day Smoker    Packs/day: 0.25    Years: 40.00    Pack years: 10.00    Types: Cigarettes  . Smokeless tobacco: Never Used  . Tobacco comment: smokes 1-2 cigarettes at night when drinking  07/02/16  Substance Use Topics  . Alcohol use: Yes    Comment: ocassionally  . Drug use: Never    Current Outpatient Medications  Medication Sig Dispense Refill  . albuterol (PROVENTIL HFA;VENTOLIN HFA) 108 (90 BASE) MCG/ACT inhaler Inhale 2 puffs into the lungs every 6 (six) hours as needed for wheezing or shortness of breath.    . allopurinol (ZYLOPRIM) 300 MG tablet Take 300 mg by mouth daily.    . Calcium Carbonate Antacid (TUMS PO) Take 1 tablet by mouth as needed.    . dasatinib (SPRYCEL) 100 MG tablet Take 100 mg by mouth daily.    . Fluticasone-Salmeterol (ADVAIR DISKUS) 500-50 MCG/DOSE AEPB 1 puff inhaled twice daily.  NEED OFFICE VISIT FOR REFILLS. 60 each 0  . guaiFENesin (MUCINEX PO) Take 1 tablet by mouth as  needed.    Marland Kitchen HYDROcodone-acetaminophen (NORCO) 7.5-325 MG tablet Take 1 tablet by mouth as needed for moderate pain.    Marland Kitchen losartan-hydrochlorothiazide (HYZAAR) 100-25 MG tablet TAKE 1 TABLET BY MOUTH DAILY. 30 tablet 5  . tamsulosin (FLOMAX) 0.4 MG CAPS capsule Take 0.8 mg by mouth daily.     . predniSONE (DELTASONE) 20 MG tablet Take 2 tablets (40 mg total) by mouth daily. (Patient not taking: Reported on 12/08/2018) 8 tablet 0   No current facility-administered medications for this visit.     Allergies  Allergen Reactions  . Pneumococcal Vaccines Swelling    Review of Systems:  Constitutional: Denies fever, chills, diaphoresis, appetite change and fatigue.  HEENT: Denies photophobia, eye pain, redness, hearing loss, ear pain, congestion, sore throat, rhinorrhea, sneezing, mouth sores, neck pain, neck stiffness and tinnitus.   Respiratory: Denies SOB, DOE, cough, chest tightness,  and wheezing.   Cardiovascular: Denies chest pain, palpitations and leg swelling.  Genitourinary: Denies dysuria, urgency, frequency, hematuria, flank pain and difficulty urinating.  Musculoskeletal: Denies myalgias, back pain, joint swelling, arthralgias and gait problem.  Skin: No rash.  Neurological: Denies dizziness, seizures, syncope, weakness, light-headedness, numbness and headaches.  Hematological: Denies adenopathy. Easy bruising, personal or family bleeding history  Psychiatric/Behavioral: No anxiety or depression     Physical Exam:    BP (!) 158/90   Pulse 69   Temp 98.1 F (36.7 C)   Ht 5\' 10"  (1.778 m)   Wt 215 lb 6 oz (97.7 kg)   BMI 30.90 kg/m  Filed Weights   12/08/18 1532  Weight: 215 lb 6 oz (97.7 kg)   Constitutional:  Well-developed, in no acute distress. Psychiatric: Normal mood and affect. Behavior is normal. HEENT: Pupils normal.  Conjunctivae are normal. No scleral icterus. Neck supple.  Cardiovascular: Normal rate, regular rhythm. No edema Pulmonary/chest: Bilateral  decreased breath sounds Abdominal: Soft, nondistended. Nontender. Bowel sounds active throughout. There are no masses palpable. No hepatomegaly. Rectal:  defered Neurological: Alert and oriented to person place and time. Skin: Skin is warm and dry. No rashes noted.  Data Reviewed: I have personally reviewed following labs and imaging studies  CBC: CBC Latest Ref Rng & Units 04/27/2012  WBC 4.0 - 10.5 K/uL 6.6  Hemoglobin 13.0 - 17.0 g/dL 14.7  Hematocrit 39.0 - 52.0 % 40.7  Platelets 150 - 400 K/uL 194    CMP: CMP Latest Ref Rng & Units 04/27/2012  Glucose 70 - 99 mg/dL 97  BUN 6 - 23 mg/dL 9  Creatinine 0.50 - 1.35 mg/dL 0.63  Sodium 135 - 145 mEq/L 134(L)  Potassium 3.5 - 5.1 mEq/L 3.4(L)  Chloride 96 - 112  mEq/L 98  CO2 19 - 32 mEq/L 24  Calcium 8.4 - 10.5 mg/dL 8.6  Total Protein 6.0 - 8.3 g/dL 7.1  Total Bilirubin 0.3 - 1.2 mg/dL 0.4  Alkaline Phos 39 - 117 U/L 29(L)  AST 0 - 37 U/L 24  ALT 0 - 53 U/L 25      Carmell Austria, MD 12/08/2018, 3:57 PM  Cc: Daphene Jaeger, PA-C

## 2019-01-10 DIAGNOSIS — S72431A Displaced fracture of medial condyle of right femur, initial encounter for closed fracture: Secondary | ICD-10-CM | POA: Diagnosis not present

## 2019-01-10 DIAGNOSIS — S0990XA Unspecified injury of head, initial encounter: Secondary | ICD-10-CM | POA: Diagnosis not present

## 2019-01-10 DIAGNOSIS — S20212A Contusion of left front wall of thorax, initial encounter: Secondary | ICD-10-CM | POA: Diagnosis not present

## 2019-01-10 DIAGNOSIS — S6991XA Unspecified injury of right wrist, hand and finger(s), initial encounter: Secondary | ICD-10-CM | POA: Diagnosis not present

## 2019-01-10 DIAGNOSIS — S199XXA Unspecified injury of neck, initial encounter: Secondary | ICD-10-CM | POA: Diagnosis not present

## 2019-01-10 DIAGNOSIS — R221 Localized swelling, mass and lump, neck: Secondary | ICD-10-CM | POA: Diagnosis not present

## 2019-01-10 DIAGNOSIS — S72301A Unspecified fracture of shaft of right femur, initial encounter for closed fracture: Secondary | ICD-10-CM | POA: Diagnosis not present

## 2019-01-10 DIAGNOSIS — I10 Essential (primary) hypertension: Secondary | ICD-10-CM | POA: Diagnosis not present

## 2019-01-10 DIAGNOSIS — S1093XA Contusion of unspecified part of neck, initial encounter: Secondary | ICD-10-CM | POA: Diagnosis not present

## 2019-01-10 DIAGNOSIS — S299XXA Unspecified injury of thorax, initial encounter: Secondary | ICD-10-CM | POA: Diagnosis not present

## 2019-01-10 DIAGNOSIS — Z856 Personal history of leukemia: Secondary | ICD-10-CM | POA: Diagnosis not present

## 2019-01-10 DIAGNOSIS — E7800 Pure hypercholesterolemia, unspecified: Secondary | ICD-10-CM | POA: Diagnosis not present

## 2019-01-10 DIAGNOSIS — S3991XA Unspecified injury of abdomen, initial encounter: Secondary | ICD-10-CM | POA: Diagnosis not present

## 2019-01-10 DIAGNOSIS — F17210 Nicotine dependence, cigarettes, uncomplicated: Secondary | ICD-10-CM | POA: Diagnosis not present

## 2019-01-23 DIAGNOSIS — S4992XA Unspecified injury of left shoulder and upper arm, initial encounter: Secondary | ICD-10-CM | POA: Diagnosis not present

## 2019-02-04 DIAGNOSIS — C9211 Chronic myeloid leukemia, BCR/ABL-positive, in remission: Secondary | ICD-10-CM | POA: Diagnosis not present

## 2019-02-04 DIAGNOSIS — D708 Other neutropenia: Secondary | ICD-10-CM | POA: Diagnosis not present

## 2019-02-04 DIAGNOSIS — C9210 Chronic myeloid leukemia, BCR/ABL-positive, not having achieved remission: Secondary | ICD-10-CM | POA: Diagnosis not present

## 2019-02-04 DIAGNOSIS — Z85118 Personal history of other malignant neoplasm of bronchus and lung: Secondary | ICD-10-CM | POA: Diagnosis not present

## 2019-03-22 DIAGNOSIS — M47816 Spondylosis without myelopathy or radiculopathy, lumbar region: Secondary | ICD-10-CM | POA: Diagnosis not present

## 2019-04-08 DIAGNOSIS — M47816 Spondylosis without myelopathy or radiculopathy, lumbar region: Secondary | ICD-10-CM | POA: Diagnosis not present

## 2019-05-07 DIAGNOSIS — C249 Malignant neoplasm of biliary tract, unspecified: Secondary | ICD-10-CM | POA: Diagnosis not present

## 2019-05-07 DIAGNOSIS — F17210 Nicotine dependence, cigarettes, uncomplicated: Secondary | ICD-10-CM | POA: Diagnosis not present

## 2019-05-07 DIAGNOSIS — Z139 Encounter for screening, unspecified: Secondary | ICD-10-CM | POA: Diagnosis not present

## 2019-05-07 DIAGNOSIS — G8929 Other chronic pain: Secondary | ICD-10-CM | POA: Diagnosis not present

## 2019-05-07 DIAGNOSIS — R0609 Other forms of dyspnea: Secondary | ICD-10-CM | POA: Diagnosis not present

## 2019-05-07 DIAGNOSIS — C9210 Chronic myeloid leukemia, BCR/ABL-positive, not having achieved remission: Secondary | ICD-10-CM | POA: Diagnosis not present

## 2019-05-07 DIAGNOSIS — Z79899 Other long term (current) drug therapy: Secondary | ICD-10-CM | POA: Diagnosis not present

## 2019-05-07 DIAGNOSIS — D708 Other neutropenia: Secondary | ICD-10-CM | POA: Diagnosis not present

## 2019-05-07 DIAGNOSIS — Z85118 Personal history of other malignant neoplasm of bronchus and lung: Secondary | ICD-10-CM | POA: Diagnosis not present

## 2019-05-07 DIAGNOSIS — J438 Other emphysema: Secondary | ICD-10-CM | POA: Diagnosis not present

## 2019-05-07 DIAGNOSIS — R978 Other abnormal tumor markers: Secondary | ICD-10-CM | POA: Diagnosis not present

## 2019-05-11 DIAGNOSIS — J90 Pleural effusion, not elsewhere classified: Secondary | ICD-10-CM | POA: Diagnosis not present

## 2019-05-11 DIAGNOSIS — R911 Solitary pulmonary nodule: Secondary | ICD-10-CM | POA: Diagnosis not present

## 2019-05-11 DIAGNOSIS — R05 Cough: Secondary | ICD-10-CM | POA: Diagnosis not present

## 2019-05-11 DIAGNOSIS — C9210 Chronic myeloid leukemia, BCR/ABL-positive, not having achieved remission: Secondary | ICD-10-CM | POA: Diagnosis not present

## 2019-05-11 DIAGNOSIS — R0600 Dyspnea, unspecified: Secondary | ICD-10-CM | POA: Diagnosis not present

## 2019-05-11 DIAGNOSIS — C3412 Malignant neoplasm of upper lobe, left bronchus or lung: Secondary | ICD-10-CM | POA: Diagnosis not present

## 2019-06-13 DIAGNOSIS — Z23 Encounter for immunization: Secondary | ICD-10-CM | POA: Diagnosis not present

## 2019-06-28 DIAGNOSIS — M25561 Pain in right knee: Secondary | ICD-10-CM | POA: Diagnosis not present

## 2019-06-28 DIAGNOSIS — Z6832 Body mass index (BMI) 32.0-32.9, adult: Secondary | ICD-10-CM | POA: Diagnosis not present

## 2019-06-30 DIAGNOSIS — R310 Gross hematuria: Secondary | ICD-10-CM | POA: Diagnosis not present

## 2019-06-30 DIAGNOSIS — N401 Enlarged prostate with lower urinary tract symptoms: Secondary | ICD-10-CM | POA: Diagnosis not present

## 2019-07-14 DIAGNOSIS — R310 Gross hematuria: Secondary | ICD-10-CM | POA: Diagnosis not present

## 2019-07-14 DIAGNOSIS — N401 Enlarged prostate with lower urinary tract symptoms: Secondary | ICD-10-CM | POA: Diagnosis not present

## 2019-07-22 DIAGNOSIS — I10 Essential (primary) hypertension: Secondary | ICD-10-CM | POA: Diagnosis not present

## 2019-07-22 DIAGNOSIS — Z6832 Body mass index (BMI) 32.0-32.9, adult: Secondary | ICD-10-CM | POA: Diagnosis not present

## 2019-07-22 DIAGNOSIS — C9210 Chronic myeloid leukemia, BCR/ABL-positive, not having achieved remission: Secondary | ICD-10-CM | POA: Diagnosis not present

## 2019-07-22 DIAGNOSIS — I739 Peripheral vascular disease, unspecified: Secondary | ICD-10-CM | POA: Diagnosis not present

## 2019-08-06 ENCOUNTER — Encounter: Payer: Self-pay | Admitting: Gastroenterology

## 2019-08-06 DIAGNOSIS — C9210 Chronic myeloid leukemia, BCR/ABL-positive, not having achieved remission: Secondary | ICD-10-CM | POA: Diagnosis not present

## 2019-08-06 DIAGNOSIS — Z85118 Personal history of other malignant neoplasm of bronchus and lung: Secondary | ICD-10-CM | POA: Diagnosis not present

## 2019-08-06 DIAGNOSIS — C9211 Chronic myeloid leukemia, BCR/ABL-positive, in remission: Secondary | ICD-10-CM | POA: Diagnosis not present

## 2019-08-20 DIAGNOSIS — D519 Vitamin B12 deficiency anemia, unspecified: Secondary | ICD-10-CM | POA: Diagnosis not present

## 2019-08-27 DIAGNOSIS — C9210 Chronic myeloid leukemia, BCR/ABL-positive, not having achieved remission: Secondary | ICD-10-CM | POA: Diagnosis not present

## 2019-08-27 DIAGNOSIS — Z79899 Other long term (current) drug therapy: Secondary | ICD-10-CM | POA: Diagnosis not present

## 2019-08-27 DIAGNOSIS — Z1331 Encounter for screening for depression: Secondary | ICD-10-CM | POA: Diagnosis not present

## 2019-08-27 DIAGNOSIS — Z139 Encounter for screening, unspecified: Secondary | ICD-10-CM | POA: Diagnosis not present

## 2019-08-27 DIAGNOSIS — C249 Malignant neoplasm of biliary tract, unspecified: Secondary | ICD-10-CM | POA: Diagnosis not present

## 2019-08-27 DIAGNOSIS — D519 Vitamin B12 deficiency anemia, unspecified: Secondary | ICD-10-CM | POA: Diagnosis not present

## 2019-08-27 DIAGNOSIS — Z9181 History of falling: Secondary | ICD-10-CM | POA: Diagnosis not present

## 2019-08-27 DIAGNOSIS — G8929 Other chronic pain: Secondary | ICD-10-CM | POA: Diagnosis not present

## 2019-09-03 DIAGNOSIS — D519 Vitamin B12 deficiency anemia, unspecified: Secondary | ICD-10-CM | POA: Diagnosis not present

## 2019-09-09 DIAGNOSIS — D519 Vitamin B12 deficiency anemia, unspecified: Secondary | ICD-10-CM | POA: Diagnosis not present

## 2019-09-23 ENCOUNTER — Other Ambulatory Visit: Payer: Self-pay

## 2019-09-23 ENCOUNTER — Telehealth (INDEPENDENT_AMBULATORY_CARE_PROVIDER_SITE_OTHER): Payer: 59 | Admitting: Gastroenterology

## 2019-09-23 VITALS — Ht 70.0 in | Wt 222.0 lb

## 2019-09-23 DIAGNOSIS — Z1159 Encounter for screening for other viral diseases: Secondary | ICD-10-CM

## 2019-09-23 DIAGNOSIS — R1013 Epigastric pain: Secondary | ICD-10-CM

## 2019-09-23 NOTE — Patient Instructions (Addendum)
If you are age 69 or older, your body mass index should be between 23-30. Your Body mass index is 31.85 kg/m. If this is out of the aforementioned range listed, please consider follow up with your Primary Care Provider.  If you are age 67 or younger, your body mass index should be between 19-25. Your Body mass index is 31.85 kg/m. If this is out of the aformentioned range listed, please consider follow up with your Primary Care Provider.   You have been scheduled for an endoscopy. Please follow written instructions given to you at your visit today. If you use inhalers (even only as needed), please bring them with you on the day of your procedure. Your physician has requested that you go to www.startemmi.com and enter the access code given to you at your visit today. This web site gives a general overview about your procedure. However, you should still follow specific instructions given to you by our office regarding your preparation for the procedure.  You will be due for a recall colonoscopy in 05/2020. We will send you a reminder in the mail when it gets closer to that time.   I have attached a Pre Procedure Patient Acknowledgement form and a prepaid envelope, please initial and sign form and mail back in envelope.    Thank you,  Dr. [name]

## 2019-09-23 NOTE — Progress Notes (Signed)
Chief Complaint: FU  Referring Provider:  [name], MD      ASSESSMENT AND PLAN;   #1. Epi pain  D/d PUD, GERD, gastritis, nonulcer dyspepsia, gastroparesis, musculoskeletal etiology.  #2.  H/O cholangiocarcinoma (stage 1A) S/P Whipple at [facility] 2014 (Dr Vernard Gambles).  No recurrence on CT chest/abdomen/pelvis at [facility] Nov 2019. Nl LFTs including TB as below.  #3.  Comorbid conditions include CML in remission, H/O Stage 1A non-small cell lung CA s/p LUL lung resection 10/2014, chronic back pain requiring narcotics. COPD with continued smoking, CAD, HTN, HLD  #4.  B12 deficiency on B12 supplements.  Plan: -EGD for further evaluation -PPIs not with Dasatinib d/t interaction. Continue TUMS, 2 hours before or after. -Please obtain last CT chest/ado/pelvis Jan 2020.  -Has appt Oct 05, 2019 with Dr Hinton Rao -Recall colon 05/2020 with 2 day prep (had poor preparation during last colon, only if clinical condition permits)   HPI:    Kyle Micco Sr. is a 68 y.o. male  With epigastric pain x 3 to 6 months, Tums helps bid Gets exacerbated after eating especially spicy food. Denies having any heartburn. No nausea/vomiting. No recent weight loss.  Quit smoking recently  No nonsteroidals.  Stopped Protonix due to unclear reasons.   Initially thought his dad had colon cancer.  Now it is clear, he had prostate cancer.  No melena or hematochezia.  No fever or chills.   Seen by Dr. Percell Locus blood work done last week which according to the patient was normal.  Also had CT chest abdomen and pelvis in Nov 2019 at [facility] -negative for recurrence.  He also had CT chest August 2020 which was clear.  Labs: WBC count 2.8, hemoglobin 13.8, MCV 94, platelets 209, Na 133, creatinine 0.7, TB 0.5, AST 38, ALT 24, alk phos 32, albumin 3.6.   Past Medical History:  Diagnosis Date  . Arthritis   . Asthma   . BPH (benign prostatic hyperplasia)   . CAD (coronary artery  disease)   . Cholangiocarcinoma of biliary tract ([place])   . Chronic myelogenous leukemia ([place])   . Diabetes mellitus   . DJD (degenerative joint disease)   . Dyslipidemia   . Family history of colonic polyps   . Gout   . HTN (hypertension)   . Hypercholesterolemia   . Lung cancer ([place])   . Myelogenous leukemia ([place])   . OA (osteoarthritis)   . PAD (peripheral artery disease) ([place])   . Renal insufficiency   . Tendonitis    patellar    Past Surgical History:  Procedure Laterality Date  . APPENDECTOMY    . Bowel duct surg  2014   Bowel cancer  . CARDIAC CATHETERIZATION  2000  . COLONOSCOPY  06/23/2015   mild sigmoid diverticulosis. small external and internal hemorrhoids. otherwise normal colonoscopy to terminal ileum  . LUNG CANCER SURGERY  Jan. 2016   partial lobectomy  . wipple procedure     stage I- due to bile duct cancer-2014 at [facility] (Dr Vernard Gambles). Stage IA cholangiocarcinoma    Family History  Problem Relation Age of Onset  . Prostate cancer Father 76       deceased  . Hypertension Mother   . Hypertension Brother   . Hypertension Sister   . Asthma Sister   . Colon cancer Neg Hx   . Esophageal cancer Neg Hx     Social History   Tobacco Use  . Smoking status: Former Smoker  Packs/day: 0.25    Years: 40.00    Pack years: 10.00    Types: Cigarettes  . Smokeless tobacco: Never Used  . Tobacco comment: smokes 1-2 cigarettes at night when drinking 07/02/16. Quit 4 months ago  Substance Use Topics  . Alcohol use: Yes    Comment: ocassionally  . Drug use: Never    Current Outpatient Medications  Medication Sig Dispense Refill  . albuterol (PROVENTIL HFA;VENTOLIN HFA) 108 (90 BASE) MCG/ACT inhaler Inhale 2 puffs into the lungs every 6 (six) hours as needed for wheezing or shortness of breath.    . allopurinol (ZYLOPRIM) 300 MG tablet Take 300 mg by mouth daily.    . Calcium Carbonate Antacid (TUMS PO) Take 1 tablet by mouth as needed.    . dasatinib (SPRYCEL)  100 MG tablet Take 100 mg by mouth daily.    Marland Kitchen guaiFENesin (MUCINEX PO) Take 1 tablet by mouth as needed.    Marland Kitchen HYDROcodone-acetaminophen (NORCO) 7.5-325 MG tablet Take 1 tablet by mouth as needed for moderate pain.    Marland Kitchen losartan-hydrochlorothiazide (HYZAAR) 100-25 MG tablet TAKE 1 TABLET BY MOUTH DAILY. 30 tablet 5  . methocarbamol (ROBAXIN) 500 MG tablet Take 500 mg by mouth 2 (two) times daily as needed for muscle spasms.    . pantoprazole (PROTONIX) 40 MG tablet Take 1 tablet (40 mg total) by mouth daily. 30 tablet 11  . tamsulosin (FLOMAX) 0.4 MG CAPS capsule Take 0.4 mg by mouth daily.     Marland Kitchen umeclidinium-vilanterol (ANORO ELLIPTA) 62.5-25 MCG/INH AEPB Inhale 1 puff into the lungs daily.     No current facility-administered medications for this visit.    Allergies  Allergen Reactions  . Pneumococcal Vaccines Swelling    Review of Systems:  neg     Physical Exam:    Ht '5\' 10"'$  (1.778 m)   Wt 222 lb (100.7 kg)   BMI 31.85 kg/m  Filed Weights   09/23/19 0810  Weight: 222 lb (100.7 kg)   Constitutional:  Well-developed, in no acute distress. Psychiatric: Normal mood and affect. Behavior is normal. tele  Data Reviewed: I have personally reviewed following labs and imaging studies  CBC: CBC Latest Ref Rng & Units 04/27/2012  WBC 4.0 - 10.5 K/uL 6.6  Hemoglobin 13.0 - 17.0 g/dL 14.7  Hematocrit 39.0 - 52.0 % 40.7  Platelets 150 - 400 K/uL 194    CMP: CMP Latest Ref Rng & Units 04/27/2012  Glucose 70 - 99 mg/dL 97  BUN 6 - 23 mg/dL 9  Creatinine 0.50 - 1.35 mg/dL 0.63  Sodium 135 - 145 mEq/L 134(L)  Potassium 3.5 - 5.1 mEq/L 3.4(L)  Chloride 96 - 112 mEq/L 98  CO2 19 - 32 mEq/L 24  Calcium 8.4 - 10.5 mg/dL 8.6  Total Protein 6.0 - 8.3 g/dL 7.1  Total Bilirubin 0.3 - 1.2 mg/dL 0.4  Alkaline Phos 39 - 117 U/L 29(L)  AST 0 - 37 U/L 24  ALT 0 - 53 U/L 25  I connected with  Kyle Fantasia Sr. on 09/25/19 by a video enabled telemedicine application and verified  that I am speaking with the correct person using two identifiers.   I discussed the limitations of evaluation and management by telemedicine. The patient expressed understanding and agreed to proceed.  Time spent on phone call/coordination of care/review of records: 25 min    Carmell Austria, MD 09/23/2019, 11:01 AM  Cc: [name], MD

## 2019-10-12 DIAGNOSIS — D519 Vitamin B12 deficiency anemia, unspecified: Secondary | ICD-10-CM | POA: Diagnosis not present

## 2019-10-19 ENCOUNTER — Other Ambulatory Visit: Payer: Self-pay | Admitting: Gastroenterology

## 2019-10-19 ENCOUNTER — Ambulatory Visit (INDEPENDENT_AMBULATORY_CARE_PROVIDER_SITE_OTHER): Payer: 59

## 2019-10-19 DIAGNOSIS — Z1159 Encounter for screening for other viral diseases: Secondary | ICD-10-CM | POA: Diagnosis not present

## 2019-10-19 LAB — SARS CORONAVIRUS 2 (TAT 6-24 HRS): SARS Coronavirus 2: NEGATIVE

## 2019-10-21 ENCOUNTER — Telehealth: Payer: Self-pay | Admitting: Gastroenterology

## 2019-10-21 ENCOUNTER — Other Ambulatory Visit: Payer: Self-pay

## 2019-10-21 ENCOUNTER — Ambulatory Visit (AMBULATORY_SURGERY_CENTER): Payer: 59 | Admitting: Gastroenterology

## 2019-10-21 ENCOUNTER — Encounter: Payer: Self-pay | Admitting: Gastroenterology

## 2019-10-21 VITALS — BP 123/69 | HR 64 | Temp 97.7°F | Resp 15 | Ht 70.0 in | Wt 222.0 lb

## 2019-10-21 DIAGNOSIS — K2950 Unspecified chronic gastritis without bleeding: Secondary | ICD-10-CM | POA: Diagnosis not present

## 2019-10-21 DIAGNOSIS — R1013 Epigastric pain: Secondary | ICD-10-CM

## 2019-10-21 DIAGNOSIS — K2940 Chronic atrophic gastritis without bleeding: Secondary | ICD-10-CM | POA: Diagnosis not present

## 2019-10-21 DIAGNOSIS — K289 Gastrojejunal ulcer, unspecified as acute or chronic, without hemorrhage or perforation: Secondary | ICD-10-CM

## 2019-10-21 MED ORDER — SUCRALFATE 1 GM/10ML PO SUSP: 1.0000 g | Freq: Four times a day (QID) | ORAL | 2 refills | Status: DC

## 2019-10-21 MED ORDER — SODIUM CHLORIDE 0.9 % IV SOLN: 500.0000 mL | Freq: Once | INTRAVENOUS | Status: DC

## 2019-10-21 NOTE — Addendum Note (Signed)
Addended by: Bea Laura on: 10/21/2019 03:38 PM   Modules accepted: Orders

## 2019-10-21 NOTE — Progress Notes (Signed)
Called to room to assist during endoscopic procedure.  Patient ID and intended procedure confirmed with present staff. Received instructions for my participation in the procedure from the performing physician.  

## 2019-10-21 NOTE — Patient Instructions (Addendum)
YOU HAD AN ENDOSCOPIC PROCEDURE TODAY AT THE [facility]:   Refer to the procedure report that was given to you for any specific questions about what was found during the examination.  If the procedure report does not answer your questions, please call your gastroenterologist to clarify.  If you requested that your care partner not be given the details of your procedure findings, then the procedure report has been included in a sealed envelope for you to review at your convenience later.  YOU SHOULD EXPECT: Some feelings of bloating in the abdomen. Passage of more gas than usual.  Walking can help get rid of the air that was put into your GI tract during the procedure and reduce the bloating. If you had a lower endoscopy (such as a colonoscopy or flexible sigmoidoscopy) you may notice spotting of blood in your stool or on the toilet paper. If you underwent a bowel prep for your procedure, you may not have a normal bowel movement for a few days.  Please Note:  You might notice some irritation and congestion in your nose or some drainage.  This is from the oxygen used during your procedure.  There is no need for concern and it should clear up in a day or so.  SYMPTOMS TO REPORT IMMEDIATELY:   Following upper endoscopy (EGD)  Vomiting of blood or coffee ground material  New chest pain or pain under the shoulder blades  Painful or persistently difficult swallowing  New shortness of breath  Fever of 100F or higher  Black, tarry-looking stools  For urgent or emergent issues, a gastroenterologist can be reached at any hour by calling [phone].   DIET:  We do recommend a small meal at first, but then you may proceed to your regular diet.  Drink plenty of fluids but you should avoid alcoholic beverages for 24 hours.  ACTIVITY:  You should plan to take it easy for the rest of today and you should NOT DRIVE or use heavy machinery until tomorrow (because of the sedation medicines used  during the test).    FOLLOW UP: Our staff will call the number listed on your records 48-72 hours following your procedure to check on you and address any questions or concerns that you may have regarding the information given to you following your procedure. If we do not reach you, we will leave a message.  We will attempt to reach you two times.  During this call, we will ask if you have developed any symptoms of COVID 19. If you develop any symptoms (ie: fever, flu-like symptoms, shortness of breath, cough etc.) before then, please call [phone].  If you test positive for Covid 19 in the 2 weeks post procedure, please call and report this information to us.    If any biopsies were taken you will be contacted by phone or by letter within the next 1-3 weeks.  Please call us at [phone] if you have not heard about the biopsies in 3 weeks.    SIGNATURES/CONFIDENTIALITY: You and/or your care partner have signed paperwork which will be entered into your electronic medical record.  These signatures attest to the fact that that the information above on your After Visit Summary has been reviewed and is understood.  Full responsibility of the confidentiality of this discharge information lies with you and/or your care-partner. 

## 2019-10-21 NOTE — Telephone Encounter (Signed)
Called and spoke with patient-patient has been instructed on how to make a Carafate slurry (currently has a RX for this medication but will also need RX to be sent in)-RX has been sent in by [facility] RN; Patient advised to call back to the office at [phone] should questions/concerns arise;  Patient verbalized understanding of information/instructions;

## 2019-10-21 NOTE — Progress Notes (Signed)
Pt tolerated well. VSS. Awake and to recovery. 

## 2019-10-21 NOTE — Op Note (Signed)
[place] Patient Name: Kyle Montoya Procedure Date: 10/21/2019 9:48 AM MRN: [id number] Endoscopist: [name] , MD Age: 69 Referring MD:  Date of Birth: [birth year] Gender: Male Account #: [id number] Procedure:                Upper GI endoscopy Indications:              #1. Epi pain                           #2. H/O cholangioCa (stage 1A) S/P Whipple at [facility]                            2014 (Dr Vernard Gambles). No recurrence on CT                            chest/abdomen/pelvis at [facility] Nov 2019. Nl LFTs Medicines:                Monitored Anesthesia Care Procedure:                Pre-Anesthesia Assessment:                           - Prior to the procedure, a History and Physical                            was performed, and patient medications and                            allergies were reviewed. The patient's tolerance of                            previous anesthesia was also reviewed. The risks                            and benefits of the procedure and the sedation                            options and risks were discussed with the patient.                            All questions were answered, and informed consent                            was obtained. Prior Anticoagulants: The patient has                            taken no previous anticoagulant or antiplatelet                            agents. ASA Grade Assessment: III - A patient with                            severe systemic disease. After reviewing the risks  and benefits, the patient was deemed in                            satisfactory condition to undergo the procedure.                           After obtaining informed consent, the endoscope was                            passed under direct vision. Throughout the                            procedure, the patient's blood pressure, pulse, and                            oxygen saturations were monitored continuously. The                    Endoscope was introduced through the mouth, and                            advanced to the jejunum. The upper GI endoscopy was                            accomplished without difficulty. The patient                            tolerated the procedure well. Scope In: Scope Out: Findings:                 The examined esophagus was normal with well-defined                            Z-line at 40 cm, examined by NBI.                           Diffuse mild inflammation characterized by erythema                            was found in the entire examined stomach. Biopsies                            were taken with a cold forceps for histology.                           There was evidence of previous pylorus sparing                            pancreaticoduodenectomy (Whipple). The afferent                            (superior) limb had 2 linear ulcers on 2 adjacent                            folds with clean base measuring 1 cm each, with  mild stenosis (measuring 1.4 cm) which was easily                            traversed. The efferent (inferior) limb was normal.                            Multiple biopsies were obtained from the ulcers and                            from the efferent limb and sent for histology. Complications:            No immediate complications. Estimated Blood Loss:     Estimated blood loss: none. Impression:               - Normal esophagus.                           - Atrophic gastritis. Biopsied.                           - Patent but mildly stenosed                            pancreaticoduodenectomy (Whipple), characterized by                            ulceration was found. Biopsied. Recommendation:           - Patient has a contact number available for                            emergencies. The signs and symptoms of potential                            delayed complications were discussed with the                             patient. Return to normal activities tomorrow.                            Written discharge instructions were provided to the                            patient.                           - Resume previous diet.                           - PPIs not with Dasatinib d/t interaction. Would                            try Carafate slurry 1 g p.o. 4 times daily 2 hours                            before or after dasatinib x 4 weeks (2 refills). If  still with problems, would consider PPIs after                            discussion with Dr. Hinton Rao.                           - No ibuprofen, naproxen, or other non-steroidal                            anti-inflammatory drugs.                           - Await pathology results.                           - Return to GI clinic in 12 weeks. [name], MD 10/21/2019 10:29:22 AM This report has been signed electronically.

## 2019-10-25 ENCOUNTER — Telehealth: Payer: Self-pay

## 2019-10-25 NOTE — Telephone Encounter (Signed)
  Follow up Call-  Call back number 10/21/2019  Post procedure Call Back phone  # [phone]  Permission to leave phone message Yes  Some recent data might be hidden     Patient questions:  Do you have a fever, pain , or abdominal swelling? No. Pain Score  0 *  Have you tolerated food without any problems? Yes  Have you been able to return to your normal activities? Yes  Do you have any questions about your discharge instructions: Diet   No. Medications  No. Follow up visit  No.  Do you have questions or concerns about your Care? No.  Actions: * If pain score is 4 or above: No action needed, pain <4.  1. Have you developed a fever since your procedure? no  2.   Have you had an respiratory symptoms (SOB or cough) since your procedure? no  3.   Have you tested positive for COVID 19 since your procedure no  4.   Have you had any family members/close contacts diagnosed with the COVID 19 since your procedure?  no   If yes to any of these questions please route to Joylene John, RN and Alphonsa Gin, Therapist, sports.

## 2019-10-31 ENCOUNTER — Encounter: Payer: Self-pay | Admitting: Gastroenterology

## 2019-10-31 NOTE — Progress Notes (Signed)
Bre can you let the patient know gastric biopsies are positive for H pylori. Would recommend the following treatment regimen for 14 days: Amoxicillin 1gm BID Clarithromycin 500mg  BID Flagyl 500mg  BID Omeprazole 20 mg BID   Thereafter should continue omeprazole 20 mg p.o. once a day for 4 weeks. Unfortunately, he is on Dasatinib.  Have to run it by Dr. Hosie Poisson ([place])  Once done with therapy, the patient should wait one month and perform a stool study for H pylori antigen to ensure negative. The PPI needs to be held at least 2 weeks prior to submitting the stool sample. The patient should avoid alcohol while taking Flagyl.   Thanks  Dr Lyndel Safe

## 2019-11-01 ENCOUNTER — Other Ambulatory Visit: Payer: Self-pay

## 2019-11-01 DIAGNOSIS — A048 Other specified bacterial intestinal infections: Secondary | ICD-10-CM

## 2019-11-01 MED ORDER — CLARITHROMYCIN 500 MG PO TABS: 500.0000 mg | ORAL_TABLET | Freq: Two times a day (BID) | ORAL | 0 refills | Status: AC

## 2019-11-01 MED ORDER — METRONIDAZOLE 500 MG PO TABS: 500.0000 mg | ORAL_TABLET | Freq: Two times a day (BID) | ORAL | 0 refills | Status: AC

## 2019-11-01 MED ORDER — AMOXICILLIN 500 MG PO CAPS: 1000.0000 mg | ORAL_CAPSULE | Freq: Two times a day (BID) | ORAL | 0 refills | Status: AC

## 2019-11-01 MED ORDER — OMEPRAZOLE 20 MG PO CPDR: 20.0000 mg | DELAYED_RELEASE_CAPSULE | Freq: Two times a day (BID) | ORAL | 1 refills | Status: DC

## 2019-11-05 DIAGNOSIS — C221 Intrahepatic bile duct carcinoma: Secondary | ICD-10-CM | POA: Diagnosis not present

## 2019-11-05 DIAGNOSIS — C9310 Chronic myelomonocytic leukemia not having achieved remission: Secondary | ICD-10-CM | POA: Diagnosis not present

## 2019-11-05 DIAGNOSIS — Z85118 Personal history of other malignant neoplasm of bronchus and lung: Secondary | ICD-10-CM | POA: Diagnosis not present

## 2019-11-12 DIAGNOSIS — D519 Vitamin B12 deficiency anemia, unspecified: Secondary | ICD-10-CM | POA: Diagnosis not present

## 2019-11-23 DIAGNOSIS — J301 Allergic rhinitis due to pollen: Secondary | ICD-10-CM | POA: Diagnosis not present

## 2019-11-23 DIAGNOSIS — Z87891 Personal history of nicotine dependence: Secondary | ICD-10-CM | POA: Diagnosis not present

## 2019-11-23 DIAGNOSIS — G2581 Restless legs syndrome: Secondary | ICD-10-CM | POA: Diagnosis not present

## 2019-11-23 DIAGNOSIS — J449 Chronic obstructive pulmonary disease, unspecified: Secondary | ICD-10-CM | POA: Diagnosis not present

## 2019-11-24 ENCOUNTER — Other Ambulatory Visit: Payer: Self-pay | Admitting: Gastroenterology

## 2019-11-24 DIAGNOSIS — A048 Other specified bacterial intestinal infections: Secondary | ICD-10-CM

## 2019-12-10 DIAGNOSIS — D519 Vitamin B12 deficiency anemia, unspecified: Secondary | ICD-10-CM | POA: Diagnosis not present

## 2020-01-07 ENCOUNTER — Telehealth: Payer: Self-pay

## 2020-01-07 NOTE — Telephone Encounter (Signed)
Patient says he was treated for H. Pylori back in February and does not feel that his symptoms are any better. Patient complaining of epigastric pain, says at times its better when he takes his hydrocodone, otherwise it just hurts constantly. Patient wants to know if there is anything else he can do?

## 2020-01-10 NOTE — Telephone Encounter (Signed)
Lets make sure HP is eradicated.  Stool study for H pylori antigen to ensure negative. The PPI needs to be held at least 2 weeks prior to submitting the stool sample.  RG

## 2020-01-11 ENCOUNTER — Other Ambulatory Visit: Payer: Self-pay

## 2020-01-11 DIAGNOSIS — A048 Other specified bacterial intestinal infections: Secondary | ICD-10-CM

## 2020-01-11 DIAGNOSIS — D519 Vitamin B12 deficiency anemia, unspecified: Secondary | ICD-10-CM | POA: Diagnosis not present

## 2020-01-11 NOTE — Telephone Encounter (Signed)
I have spoke to patient, patient agrees with plan and voiced understanding. Order placed in computer.

## 2020-01-12 DIAGNOSIS — Z6832 Body mass index (BMI) 32.0-32.9, adult: Secondary | ICD-10-CM | POA: Diagnosis not present

## 2020-01-12 DIAGNOSIS — C9210 Chronic myeloid leukemia, BCR/ABL-positive, not having achieved remission: Secondary | ICD-10-CM | POA: Diagnosis not present

## 2020-01-12 DIAGNOSIS — M542 Cervicalgia: Secondary | ICD-10-CM | POA: Diagnosis not present

## 2020-01-21 ENCOUNTER — Other Ambulatory Visit: Payer: 59

## 2020-01-28 ENCOUNTER — Other Ambulatory Visit: Payer: 59

## 2020-01-28 DIAGNOSIS — A048 Other specified bacterial intestinal infections: Secondary | ICD-10-CM

## 2020-01-30 LAB — H. PYLORI ANTIGEN, STOOL: H pylori Ag, Stl: NEGATIVE

## 2020-02-02 DIAGNOSIS — C3412 Malignant neoplasm of upper lobe, left bronchus or lung: Secondary | ICD-10-CM | POA: Diagnosis not present

## 2020-02-02 DIAGNOSIS — Z8509 Personal history of malignant neoplasm of other digestive organs: Secondary | ICD-10-CM | POA: Diagnosis not present

## 2020-02-02 DIAGNOSIS — Z85110 Personal history of malignant carcinoid tumor of bronchus and lung: Secondary | ICD-10-CM | POA: Diagnosis not present

## 2020-02-02 DIAGNOSIS — F17210 Nicotine dependence, cigarettes, uncomplicated: Secondary | ICD-10-CM | POA: Diagnosis not present

## 2020-02-02 DIAGNOSIS — D72829 Elevated white blood cell count, unspecified: Secondary | ICD-10-CM | POA: Diagnosis not present

## 2020-02-02 DIAGNOSIS — C9210 Chronic myeloid leukemia, BCR/ABL-positive, not having achieved remission: Secondary | ICD-10-CM | POA: Diagnosis not present

## 2020-02-10 DIAGNOSIS — D519 Vitamin B12 deficiency anemia, unspecified: Secondary | ICD-10-CM | POA: Diagnosis not present

## 2020-03-13 DIAGNOSIS — D519 Vitamin B12 deficiency anemia, unspecified: Secondary | ICD-10-CM | POA: Diagnosis not present

## 2020-03-17 DIAGNOSIS — M25511 Pain in right shoulder: Secondary | ICD-10-CM | POA: Diagnosis not present

## 2020-03-17 DIAGNOSIS — Z6833 Body mass index (BMI) 33.0-33.9, adult: Secondary | ICD-10-CM | POA: Diagnosis not present

## 2020-03-17 DIAGNOSIS — M25512 Pain in left shoulder: Secondary | ICD-10-CM | POA: Diagnosis not present

## 2020-03-29 DIAGNOSIS — M25511 Pain in right shoulder: Secondary | ICD-10-CM | POA: Insufficient documentation

## 2020-03-29 DIAGNOSIS — M25512 Pain in left shoulder: Secondary | ICD-10-CM

## 2020-03-29 HISTORY — DX: Pain in right shoulder: M25.511

## 2020-03-29 HISTORY — DX: Pain in left shoulder: M25.512

## 2020-03-31 DIAGNOSIS — M25512 Pain in left shoulder: Secondary | ICD-10-CM | POA: Diagnosis not present

## 2020-03-31 DIAGNOSIS — M25511 Pain in right shoulder: Secondary | ICD-10-CM | POA: Diagnosis not present

## 2020-03-31 DIAGNOSIS — M7541 Impingement syndrome of right shoulder: Secondary | ICD-10-CM | POA: Insufficient documentation

## 2020-03-31 HISTORY — DX: Impingement syndrome of right shoulder: M75.41

## 2020-04-10 DIAGNOSIS — D519 Vitamin B12 deficiency anemia, unspecified: Secondary | ICD-10-CM | POA: Diagnosis not present

## 2020-05-08 DIAGNOSIS — Z85118 Personal history of other malignant neoplasm of bronchus and lung: Secondary | ICD-10-CM | POA: Diagnosis not present

## 2020-05-08 DIAGNOSIS — C3412 Malignant neoplasm of upper lobe, left bronchus or lung: Secondary | ICD-10-CM | POA: Diagnosis not present

## 2020-05-08 DIAGNOSIS — C9210 Chronic myeloid leukemia, BCR/ABL-positive, not having achieved remission: Secondary | ICD-10-CM | POA: Diagnosis not present

## 2020-05-08 DIAGNOSIS — I700 Atherosclerosis of aorta: Secondary | ICD-10-CM | POA: Diagnosis not present

## 2020-05-08 DIAGNOSIS — Z9049 Acquired absence of other specified parts of digestive tract: Secondary | ICD-10-CM | POA: Diagnosis not present

## 2020-05-08 DIAGNOSIS — M954 Acquired deformity of chest and rib: Secondary | ICD-10-CM | POA: Diagnosis not present

## 2020-05-08 DIAGNOSIS — D3502 Benign neoplasm of left adrenal gland: Secondary | ICD-10-CM | POA: Diagnosis not present

## 2020-05-08 DIAGNOSIS — J929 Pleural plaque without asbestos: Secondary | ICD-10-CM | POA: Diagnosis not present

## 2020-05-08 DIAGNOSIS — Z902 Acquired absence of lung [part of]: Secondary | ICD-10-CM | POA: Diagnosis not present

## 2020-05-09 DIAGNOSIS — M25511 Pain in right shoulder: Secondary | ICD-10-CM | POA: Diagnosis not present

## 2020-05-09 DIAGNOSIS — M25512 Pain in left shoulder: Secondary | ICD-10-CM | POA: Diagnosis not present

## 2020-05-09 DIAGNOSIS — M7541 Impingement syndrome of right shoulder: Secondary | ICD-10-CM | POA: Diagnosis not present

## 2020-05-10 DIAGNOSIS — D72819 Decreased white blood cell count, unspecified: Secondary | ICD-10-CM | POA: Diagnosis not present

## 2020-05-10 DIAGNOSIS — D72829 Elevated white blood cell count, unspecified: Secondary | ICD-10-CM | POA: Diagnosis not present

## 2020-05-10 DIAGNOSIS — E538 Deficiency of other specified B group vitamins: Secondary | ICD-10-CM | POA: Diagnosis not present

## 2020-05-10 DIAGNOSIS — K257 Chronic gastric ulcer without hemorrhage or perforation: Secondary | ICD-10-CM | POA: Diagnosis not present

## 2020-05-10 DIAGNOSIS — F17210 Nicotine dependence, cigarettes, uncomplicated: Secondary | ICD-10-CM | POA: Diagnosis not present

## 2020-05-10 DIAGNOSIS — Z85118 Personal history of other malignant neoplasm of bronchus and lung: Secondary | ICD-10-CM | POA: Diagnosis not present

## 2020-05-10 DIAGNOSIS — C9210 Chronic myeloid leukemia, BCR/ABL-positive, not having achieved remission: Secondary | ICD-10-CM | POA: Diagnosis not present

## 2020-06-25 DIAGNOSIS — Z23 Encounter for immunization: Secondary | ICD-10-CM | POA: Diagnosis not present

## 2020-07-08 DIAGNOSIS — Z23 Encounter for immunization: Secondary | ICD-10-CM | POA: Diagnosis not present

## 2020-07-11 DIAGNOSIS — M25512 Pain in left shoulder: Secondary | ICD-10-CM | POA: Diagnosis not present

## 2020-07-11 DIAGNOSIS — M7541 Impingement syndrome of right shoulder: Secondary | ICD-10-CM | POA: Diagnosis not present

## 2020-07-20 ENCOUNTER — Other Ambulatory Visit: Payer: Self-pay | Admitting: Hematology and Oncology

## 2020-07-20 DIAGNOSIS — C9210 Chronic myeloid leukemia, BCR/ABL-positive, not having achieved remission: Secondary | ICD-10-CM

## 2020-07-27 DIAGNOSIS — M25511 Pain in right shoulder: Secondary | ICD-10-CM | POA: Diagnosis not present

## 2020-08-07 ENCOUNTER — Encounter: Payer: Self-pay | Admitting: Hematology and Oncology

## 2020-08-07 ENCOUNTER — Inpatient Hospital Stay (INDEPENDENT_AMBULATORY_CARE_PROVIDER_SITE_OTHER): Payer: 59 | Admitting: Hematology and Oncology

## 2020-08-07 ENCOUNTER — Other Ambulatory Visit: Payer: Self-pay

## 2020-08-07 ENCOUNTER — Inpatient Hospital Stay: Payer: 59 | Attending: Hematology and Oncology

## 2020-08-07 DIAGNOSIS — C9210 Chronic myeloid leukemia, BCR/ABL-positive, not having achieved remission: Secondary | ICD-10-CM | POA: Diagnosis not present

## 2020-08-07 LAB — CBC WITH DIFFERENTIAL (CANCER CENTER ONLY)
Abs Immature Granulocytes: 0.01 10*3/uL (ref 0.00–0.07)
Basophils Absolute: 0 10*3/uL (ref 0.0–0.1)
Basophils Relative: 1 %
Eosinophils Absolute: 0.2 10*3/uL (ref 0.0–0.5)
Eosinophils Relative: 2 %
HCT: 40.8 % (ref 39.0–52.0)
Hemoglobin: 14 g/dL (ref 13.0–17.0)
Immature Granulocytes: 0 %
Lymphocytes Relative: 26 %
Lymphs Abs: 2 10*3/uL (ref 0.7–4.0)
MCH: 33.5 pg (ref 26.0–34.0)
MCHC: 34.3 g/dL (ref 30.0–36.0)
MCV: 97.6 fL (ref 80.0–100.0)
Monocytes Absolute: 0.9 10*3/uL (ref 0.1–1.0)
Monocytes Relative: 12 %
Neutro Abs: 4.7 10*3/uL (ref 1.7–7.7)
Neutrophils Relative %: 59 %
Platelet Count: 232 10*3/uL (ref 150–400)
RBC: 4.18 MIL/uL — ABNORMAL LOW (ref 4.22–5.81)
RDW: 14 % (ref 11.5–15.5)
WBC Count: 7.8 10*3/uL (ref 4.0–10.5)
nRBC: 0 % (ref 0.0–0.2)

## 2020-08-07 LAB — CMP (CANCER CENTER ONLY)
ALT: 21 U/L (ref 0–44)
AST: 25 U/L (ref 15–41)
Albumin: 3.8 g/dL (ref 3.5–5.0)
Alkaline Phosphatase: 50 U/L (ref 38–126)
Anion gap: 7 (ref 5–15)
BUN: 10 mg/dL (ref 8–23)
CO2: 28 mmol/L (ref 22–32)
Calcium: 8.2 mg/dL — ABNORMAL LOW (ref 8.9–10.3)
Chloride: 103 mmol/L (ref 98–111)
Creatinine: 0.81 mg/dL (ref 0.61–1.24)
GFR, Estimated: >60 mL/min (ref >60)
Glucose, Bld: 112 mg/dL — ABNORMAL HIGH (ref 70–99)
Potassium: 3.5 mmol/L (ref 3.5–5.1)
Sodium: 138 mmol/L (ref 135–145)
Total Bilirubin: 0.6 mg/dL (ref 0.3–1.2)
Total Protein: 6.7 g/dL (ref 6.5–8.1)

## 2020-08-07 NOTE — Progress Notes (Signed)
Kyle Montoya  [address] [place],  [place]  [address] [phone]  Clinic Day:  08/09/2020  Referring physician: Philmore Pali, NP   CHIEF COMPLAINT:  CC: Follow up of chronic myelogenous leukemia , lung cancer and cholangiocarcinoma.  Current Treatment:  Dasatinib100mg  daily for his chronic myelogenous leukemia.   HISTORY OF PRESENT ILLNESS:  Kyle Rambert Sr. is a 69 y.o. male with with chronic myelogenous leukemia diagnosed in October 2015 when he presented with leukocytosis.  He was originally placed on nilotinib, but developed a severe pruritic rash, so was switched to dasatinib 100 mg daily.  He achieved a major molecular remission by May 2016.   He also has a history of a stage IA cholangiocarcinoma, treated with surgical resection in June 2014.   He then underwent surgical resection of a stage IA (T1 N0 M0) non-small cell lung cancer in February 2016.  Pathology revealed a 3 cm, well-differentiated, adenocarcinoma with negative nodes.  Adjuvant chemotherapy for his lung cancer was not recommended.  He has chronic back pain and continues seeing spine specialist.  He apparently has significant degenerative disc disease.  He has also had upper abdominal pain, which he attributes to dasatinib.  He uses hydrocodone/APAP as needed for his pain.  In May 2018, PCR was positive at 0.012%, consistent with residual CML.  Repeat PCR in August 2018 revealed a major molecular response.  His CML has remained in remission since that time.  CT chest, abdomen and pelvis in November 2019 did not reveal evidence of recurrence of either malignancy.    CT chest in August 2020 did not reveal any evidence of recurrent lung malignancy.  He hashad hypocalcemia, so was instructed to take calcium 600 mg twice daily.  When he was seen in November 2020, had been having lower leg pain worsening throughout the day.  He underwent arterial ultrasound/ankle brachial indices,  which was normal.  The pain was then felt to possibly be degenerative in nature.  He reported constipation due to the calcium and as his on calcium was normal at that time,  we decreased the calcium to daily.  He had worsening neutropenia and was found to have B12 and placed on B12 injections weekly for 4 weeks, then monthly.  Serum folate was normal.   He had persistent abdominal pain, so was referred to Dr. Lyndel Safe.  He was seen by Dr. Lyndel Safe earlier this year and had 2 gastric ulcers and H. Pylori infection, which was treated.  He saw Dr. Nila Nephew earlier this year as well, and PSA was normal. When he was seen in August, he had recurrent hypocalcemia, with a calcium of 8.3.  He could not tolerate the calcium supplement, so we recommended that he take Tums 4 times daily.  He continued to complain of intermittent abdominal pain, which we have attributed to adhesions.  INTERVAL HISTORY:  Kyle Montoya is here today for routine follow up and states he has been doing fairly well.  He continues dasatinib daily without significant difficulty.  He rarely misses a dose.  He continues to have intermittent stomach pain.  He denies nausea, vomiting, diarrhea or constipation.  He reports arthritis in his shoulders and hands.  He has been receiving injections in both shoulders, with limited relief.  He had an MRI of the right shoulder, which bothers him the most and the results are pending.  He denies fevers or chills. His appetite is good. His weight has been stable. He states  he is taking Tums 2 or 3 times a day.  REVIEW OF SYSTEMS:  Review of Systems  Constitutional: Negative for appetite change, chills, fatigue and fever.  HENT:   Negative for sore throat.   Respiratory: Negative for cough and shortness of breath.   Cardiovascular: Negative for chest pain and leg swelling.  Gastrointestinal: Positive for abdominal pain (chronic, intermittent, stable). Negative for constipation, diarrhea, nausea and vomiting.   Genitourinary: Negative for difficulty urinating.   Musculoskeletal: Positive for arthralgias (bilateral shoulders and hands). Negative for neck pain.  Skin: Negative for rash.  Neurological: Negative for dizziness and headaches.  Psychiatric/Behavioral: Negative for depression. The patient is not nervous/anxious.      VITALS:   Vital signs today include a weight of 220 pounds, blood pressure 157/77, pulse 77, respirations 20 and temperature 98.3.  Pulse oximetry 97% on room air. Wt Readings from Last 3 Encounters:  08/07/20 220 lb 3.2 oz (99.9 kg)  10/21/19 222 lb (100.7 kg)  09/23/19 222 lb (100.7 kg)    There is no height or weight on file to calculate BMI.  Performance status (ECOG): 1 - Symptomatic but completely ambulatory  PHYSICAL EXAM:  Physical Exam Vitals and nursing note reviewed.  Constitutional:      Appearance: Normal appearance. He is normal weight.  HENT:     Mouth/Throat:     Mouth: Mucous membranes are moist.     Pharynx: Oropharynx is clear.  Eyes:     Extraocular Movements: Extraocular movements intact.     Conjunctiva/sclera: Conjunctivae normal.     Pupils: Pupils are equal, round, and reactive to light.  Cardiovascular:     Rate and Rhythm: Normal rate and regular rhythm.     Heart sounds: Normal heart sounds. No murmur heard.  No gallop.   Pulmonary:     Effort: Pulmonary effort is normal.     Breath sounds: Normal breath sounds.  Abdominal:     General: There is no distension.     Palpations: Abdomen is soft. There is no hepatomegaly or splenomegaly.     Tenderness: There is no abdominal tenderness.  Musculoskeletal:     Cervical back: Normal range of motion and neck supple.  Skin:    General: Skin is warm and dry.  Neurological:     General: No focal deficit present.     Mental Status: He is alert and oriented to person, place, and time.  Psychiatric:        Mood and Affect: Mood normal.        Behavior: Behavior normal.   Lymph nodes:    There is no cervical, clavicular, axillary or inguinal lymphadenopathy.   LABS:   CBC Latest Ref Rng & Units 08/07/2020 04/27/2012  WBC 4.0 - 10.5 K/uL 7.8 6.6  Hemoglobin 13.0 - 17.0 g/dL 14.0 14.7  Hematocrit 39 - 52 % 40.8 40.7  Platelets 150 - 400 K/uL 232 194   CMP Latest Ref Rng & Units 08/07/2020 04/27/2012  Glucose 70 - 99 mg/dL 112(H) 97  BUN 8 - 23 mg/dL 10 9  Creatinine 0.61 - 1.24 mg/dL 0.81 0.63  Sodium 135 - 145 mmol/L 138 134(L)  Potassium 3.5 - 5.1 mmol/L 3.5 3.4(L)  Chloride 98 - 111 mmol/L 103 98  CO2 22 - 32 mmol/L 28 24  Calcium 8.9 - 10.3 mg/dL 8.2(L) 8.6  Total Protein 6.5 - 8.1 g/dL 6.7 7.1  Total Bilirubin 0.3 - 1.2 mg/dL 0.6 0.4  Alkaline Phos 38 - 126  U/L 50 29(L)  AST 15 - 41 U/L 25 24  ALT 0 - 44 U/L 21 25     No results found for: CEA1 / No results found for: CEA1 No results found for: PSA1 No results found for: PRF163 No results found for: CAN125  No results found for: TOTALPROTELP, ALBUMINELP, A1GS, A2GS, BETS, BETA2SER, GAMS, MSPIKE, SPEI No results found for: TIBC, FERRITIN, IRONPCTSAT No results found for: LDH  STUDIES:  No results found.    HISTORY:   Past Medical History:  Diagnosis Date  . Arthritis   . Asthma   . BPH (benign prostatic hyperplasia)   . CAD (coronary artery disease)   . Cholangiocarcinoma of biliary tract ([place])   . Chronic myelogenous leukemia ([place])   . Diabetes mellitus   . DJD (degenerative joint disease)   . Dyslipidemia   . Family history of colonic polyps   . Gout   . HTN (hypertension)   . Hypercholesterolemia   . Lung cancer ([place])   . Myelogenous leukemia ([place])   . OA (osteoarthritis)   . PAD (peripheral artery disease) ([place])   . Renal insufficiency   . Tendonitis    patellar    Past Surgical History:  Procedure Laterality Date  . APPENDECTOMY    . Bowel duct surg  2014   Bowel cancer  . CARDIAC CATHETERIZATION  2000  . COLONOSCOPY  06/23/2015   mild sigmoid diverticulosis. small  external and internal hemorrhoids. otherwise normal colonoscopy to terminal ileum  . LUNG CANCER SURGERY  Jan. 2016   partial lobectomy  . wipple procedure     stage I- due to bile duct cancer-2014 at [facility] (Dr Vernard Gambles). Stage IA cholangiocarcinoma    Family History  Problem Relation Age of Onset  . Prostate cancer Father 23       deceased  . Hypertension Mother   . Hypertension Brother   . Hypertension Sister   . Asthma Sister   . Colon cancer Neg Hx   . Esophageal cancer Neg Hx     Social History:  reports that he has quit smoking. His smoking use included cigarettes. He has a 10.00 pack-year smoking history. He has never used smokeless tobacco. He reports current alcohol use. He reports that he does not use drugs.The patient is alone  today.  Allergies:  Allergies  Allergen Reactions  . Pneumococcal Vaccines Swelling    Current Medications: Current Outpatient Medications  Medication Sig Dispense Refill  . albuterol (PROVENTIL HFA;VENTOLIN HFA) 108 (90 BASE) MCG/ACT inhaler Inhale 2 puffs into the lungs every 6 (six) hours as needed for wheezing or shortness of breath.    . allopurinol (ZYLOPRIM) 300 MG tablet Take 300 mg by mouth daily.    . Calcium Carbonate Antacid (TUMS PO) Take 1 tablet by mouth as needed.    . dasatinib (SPRYCEL) 100 MG tablet Take 100 mg by mouth daily.    Marland Kitchen HYDROcodone-acetaminophen (NORCO) 7.5-325 MG tablet Take 1 tablet by mouth as needed for moderate pain.    Marland Kitchen losartan-hydrochlorothiazide (HYZAAR) 100-25 MG tablet TAKE 1 TABLET BY MOUTH DAILY. 30 tablet 5  . methocarbamol (ROBAXIN) 500 MG tablet Take 500 mg by mouth 2 (two) times daily as needed for muscle spasms.    . pantoprazole (PROTONIX) 40 MG tablet Take 1 tablet (40 mg total) by mouth daily. 30 tablet 11  . tamsulosin (FLOMAX) 0.4 MG CAPS capsule Take 0.4 mg by mouth daily.     Marland Kitchen umeclidinium-vilanterol (ANORO ELLIPTA) 62.5-25  MCG/INH AEPB Inhale 1 puff into the lungs daily.     No current  facility-administered medications for this visit.     ASSESSMENT & PLAN:   Assessment/Plan: 1. 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  He had a mild relapse in May 2018, but by that August, the testing returned to complete molecular remission and he has remained in complete molecular remission since that time. PCR for BCR/ABL is pending from today. He knows to continue dasatinib daily.  2. History of stage IA non-small cell lung cancer in 2016 treated with surgery alone.  Imaging in August 2021 not reveal any evidence of recurrence.  3. History of stage IA cholangiocarcinoma in 2014 treated with surgery alone.  He has stable intermittent abdominal pain, which may be related to dasatinib, but we suspect this could represent adhesions from surgery.   4. Leukopenia without neutropenia.   5. Rare tobacco use, but I once again advised him on the importance of complete abstinence from tobacco.  6. Mild hypocalcemia. He continues Tums 2-3 times daily.  I advised him to be sure to take at least 4 daily. I am not sure this has ever been evaluated further, so I will obtain a vitamin-D level, magnesium level and PTH today. 7. B12 deficiency.  He remains on B12 injections monthly with his primary care office. 8. History of gastric ulcers with H pylori infection.  This was treated appropriately by Dr. Lyndel Safe.  He does not have symptoms of recurrence.     We will plan to see him back in 3 months with a CBC, comprehensive metabolic panel and PCR for BCR/ABL.  The patient understands the plans discussed today and is in agreement with them.  He knows to contact our office if he develops concerns prior to his next appointment.    Marvia Pickles, PA-C

## 2020-08-08 DIAGNOSIS — M75121 Complete rotator cuff tear or rupture of right shoulder, not specified as traumatic: Secondary | ICD-10-CM | POA: Diagnosis not present

## 2020-08-08 DIAGNOSIS — M75120 Complete rotator cuff tear or rupture of unspecified shoulder, not specified as traumatic: Secondary | ICD-10-CM | POA: Insufficient documentation

## 2020-08-08 DIAGNOSIS — M25511 Pain in right shoulder: Secondary | ICD-10-CM | POA: Diagnosis not present

## 2020-08-08 DIAGNOSIS — M19019 Primary osteoarthritis, unspecified shoulder: Secondary | ICD-10-CM | POA: Diagnosis not present

## 2020-08-08 HISTORY — DX: Complete rotator cuff tear or rupture of unspecified shoulder, not specified as traumatic: M75.120

## 2020-08-10 ENCOUNTER — Other Ambulatory Visit: Payer: Self-pay | Admitting: Hematology and Oncology

## 2020-08-10 HISTORY — DX: Hypercalcemia: E83.52

## 2020-08-11 ENCOUNTER — Other Ambulatory Visit: Payer: Self-pay

## 2020-08-11 ENCOUNTER — Inpatient Hospital Stay: Payer: 59

## 2020-08-11 DIAGNOSIS — C9210 Chronic myeloid leukemia, BCR/ABL-positive, not having achieved remission: Secondary | ICD-10-CM | POA: Diagnosis not present

## 2020-08-11 DIAGNOSIS — A048 Other specified bacterial intestinal infections: Secondary | ICD-10-CM

## 2020-08-11 LAB — MAGNESIUM: Magnesium: 2 mg/dL (ref 1.7–2.4)

## 2020-08-11 LAB — VITAMIN D 25 HYDROXY (VIT D DEFICIENCY, FRACTURES): Vit D, 25-Hydroxy: 23.39 ng/mL — ABNORMAL LOW (ref 30–100)

## 2020-08-13 LAB — BCR-ABL1, CML/ALL, PCR, QUANT: Interpretation (BCRAL):: NEGATIVE

## 2020-08-14 ENCOUNTER — Telehealth: Payer: Self-pay | Admitting: Hematology and Oncology

## 2020-08-14 LAB — PTH, INTACT AND CALCIUM
Calcium, Total (PTH): 8.1 mg/dL — ABNORMAL LOW (ref 8.6–10.2)
PTH: 25 pg/mL (ref 15–65)

## 2020-08-14 NOTE — Telephone Encounter (Signed)
Telephoned patient to discuss the additional labs done to evaluate his hypocalcemia.  Magnesium and PTH were normal.  He was found to have vitamin D insufficiency, so I recommended that he take vitamin D3 2000 international units daily, in addition to the Tums 4 times daily, until his next appointment.  I will plan to repeat a vitamin-D level at that time.  The patient expresses understanding.

## 2020-08-18 DIAGNOSIS — J449 Chronic obstructive pulmonary disease, unspecified: Secondary | ICD-10-CM | POA: Diagnosis not present

## 2020-08-18 DIAGNOSIS — J209 Acute bronchitis, unspecified: Secondary | ICD-10-CM | POA: Diagnosis not present

## 2020-08-18 DIAGNOSIS — J069 Acute upper respiratory infection, unspecified: Secondary | ICD-10-CM | POA: Diagnosis not present

## 2020-08-18 DIAGNOSIS — Z20828 Contact with and (suspected) exposure to other viral communicable diseases: Secondary | ICD-10-CM | POA: Diagnosis not present

## 2020-08-18 DIAGNOSIS — Z112 Encounter for screening for other bacterial diseases: Secondary | ICD-10-CM | POA: Diagnosis not present

## 2020-08-28 DIAGNOSIS — M12811 Other specific arthropathies, not elsewhere classified, right shoulder: Secondary | ICD-10-CM | POA: Diagnosis not present

## 2020-08-28 DIAGNOSIS — Z79899 Other long term (current) drug therapy: Secondary | ICD-10-CM | POA: Diagnosis not present

## 2020-08-28 DIAGNOSIS — G8929 Other chronic pain: Secondary | ICD-10-CM | POA: Diagnosis not present

## 2020-08-28 DIAGNOSIS — Z6833 Body mass index (BMI) 33.0-33.9, adult: Secondary | ICD-10-CM | POA: Diagnosis not present

## 2020-08-28 DIAGNOSIS — J45909 Unspecified asthma, uncomplicated: Secondary | ICD-10-CM | POA: Diagnosis not present

## 2020-08-28 DIAGNOSIS — M75101 Unspecified rotator cuff tear or rupture of right shoulder, not specified as traumatic: Secondary | ICD-10-CM | POA: Diagnosis not present

## 2020-08-28 DIAGNOSIS — I10 Essential (primary) hypertension: Secondary | ICD-10-CM | POA: Diagnosis not present

## 2020-08-28 DIAGNOSIS — M109 Gout, unspecified: Secondary | ICD-10-CM | POA: Diagnosis not present

## 2020-08-28 DIAGNOSIS — Z01818 Encounter for other preprocedural examination: Secondary | ICD-10-CM | POA: Diagnosis not present

## 2020-08-28 DIAGNOSIS — C9210 Chronic myeloid leukemia, BCR/ABL-positive, not having achieved remission: Secondary | ICD-10-CM | POA: Diagnosis not present

## 2020-08-28 DIAGNOSIS — D519 Vitamin B12 deficiency anemia, unspecified: Secondary | ICD-10-CM | POA: Diagnosis not present

## 2020-08-28 DIAGNOSIS — Z720 Tobacco use: Secondary | ICD-10-CM | POA: Diagnosis not present

## 2020-08-29 DIAGNOSIS — G2581 Restless legs syndrome: Secondary | ICD-10-CM | POA: Diagnosis not present

## 2020-08-29 DIAGNOSIS — J301 Allergic rhinitis due to pollen: Secondary | ICD-10-CM | POA: Diagnosis not present

## 2020-08-29 DIAGNOSIS — J449 Chronic obstructive pulmonary disease, unspecified: Secondary | ICD-10-CM | POA: Diagnosis not present

## 2020-09-01 ENCOUNTER — Ambulatory Visit: Payer: Self-pay | Admitting: Orthopedic Surgery

## 2020-09-06 DIAGNOSIS — D519 Vitamin B12 deficiency anemia, unspecified: Secondary | ICD-10-CM | POA: Diagnosis not present

## 2020-09-12 DIAGNOSIS — R351 Nocturia: Secondary | ICD-10-CM | POA: Diagnosis not present

## 2020-09-12 DIAGNOSIS — N401 Enlarged prostate with lower urinary tract symptoms: Secondary | ICD-10-CM | POA: Diagnosis not present

## 2020-09-20 DIAGNOSIS — J208 Acute bronchitis due to other specified organisms: Secondary | ICD-10-CM | POA: Diagnosis not present

## 2020-09-20 DIAGNOSIS — Z20822 Contact with and (suspected) exposure to covid-19: Secondary | ICD-10-CM | POA: Diagnosis not present

## 2020-09-20 NOTE — Patient Instructions (Addendum)
DUE TO COVID-19 ONLY ONE VISITOR IS ALLOWED TO COME WITH YOU AND STAY IN THE WAITING ROOM ONLY DURING PRE OP AND PROCEDURE DAY OF SURGERY. THE 1 VISITOR  MAY VISIT WITH YOU AFTER SURGERY IN YOUR PRIVATE ROOM DURING VISITING HOURS ONLY!  YOU NEED TO HAVE A COVID 19 TEST ON: 09/25/20 @  10:30 AM , THIS TEST MUST BE DONE BEFORE SURGERY,  COVID TESTING SITE [place] [id number], IT IS ON THE RIGHT GOING OUT [address] APPROXIMATELY  2 MINUTES PAST [employer] ON THE RIGHT. ONCE YOUR COVID TEST IS COMPLETED,  PLEASE BEGIN THE QUARANTINE INSTRUCTIONS AS OUTLINED IN YOUR HANDOUT.                [place]    Your procedure is scheduled on: 09/27/20   Report to [facility] Main  Entrance   Report to admitting at : 6:30 AM     Call this number if you have problems the morning of surgery [phone]    Remember:   NO SOLID FOOD AFTER MIDNIGHT THE NIGHT PRIOR TO SURGERY. NOTHING BY MOUTH EXCEPT CLEAR LIQUIDS UNTIL: 5:30 AM . PLEASE FINISH GATORADE DRINK PER SURGEON ORDER  WHICH NEEDS TO BE COMPLETED AT: 5:30 AM .  CLEAR LIQUID DIET   Foods Allowed                                                                     Foods Excluded  Coffee and tea, regular and decaf                             liquids that you cannot  Plain Jell-O any favor except red or purple                                           see through such as: Fruit ices (not with fruit pulp)                                     milk, soups, orange juice  Iced Popsicles                                    All solid food Carbonated beverages, regular and diet                                    Cranberry, grape and apple juices Sports drinks like Gatorade Lightly seasoned clear broth or consume(fat free) Sugar, honey syrup  Sample Menu Breakfast                                Lunch  Supper Cranberry juice                    Beef broth                             Chicken broth Jell-O                                     Grape juice                           Apple juice Coffee or tea                        Jell-O                                      Popsicle                                                Coffee or tea                        Coffee or tea  _____________________________________________________________________  BRUSH YOUR TEETH MORNING OF SURGERY AND RINSE YOUR MOUTH OUT, NO CHEWING GUM CANDY OR MINTS.    Take these medicines the morning of surgery with A SIP OF WATER: allopurinol,tamsulosin.Use inhalers and Flonase as usual.                               You may not have any metal on your body including hair pins and              piercings  Do not wear jewelry, lotions, powders or perfumes, deodorant             Men may shave face and neck.   Do not bring valuables to the hospital. [place].  Contacts, dentures or bridgework may not be worn into surgery.  Leave suitcase in the car. After surgery it may be brought to your room.     Patients discharged the day of surgery will not be allowed to drive home. IF YOU ARE HAVING SURGERY AND GOING HOME THE SAME DAY, YOU MUST HAVE AN ADULT TO DRIVE YOU HOME AND BE WITH YOU FOR 24 HOURS. YOU MAY GO HOME BY TAXI OR UBER OR ORTHERWISE, BUT AN ADULT MUST ACCOMPANY YOU HOME AND STAY WITH YOU FOR 24 HOURS.  Name and phone number of your driver:  Special Instructions: N/A              Please read over the following fact sheets you were given: _____________________________________________________________________         [facility] - Preparing for Surgery Before surgery, you can play an important role.  Because skin is not sterile, your skin needs to be as free of germs as possible.  You can reduce the number of germs on your skin by washing  with CHG (chlorahexidine gluconate) soap before surgery.  CHG is an antiseptic cleaner which  kills germs and bonds with the skin to continue killing germs even after washing. Please DO NOT use if you have an allergy to CHG or antibacterial soaps.  If your skin becomes reddened/irritated stop using the CHG and inform your nurse when you arrive at Short Stay. Do not shave (including legs and underarms) for at least 48 hours prior to the first CHG shower.  You may shave your face/neck. Please follow these instructions carefully:  1.  Shower with CHG Soap the night before surgery and the  morning of Surgery.  2.  If you choose to wash your hair, wash your hair first as usual with your  normal  shampoo.  3.  After you shampoo, rinse your hair and body thoroughly to remove the  shampoo.                           4.  Use CHG as you would any other liquid soap.  You can apply chg directly  to the skin and wash                       Gently with a scrungie or clean washcloth.  5.  Apply the CHG Soap to your body ONLY FROM THE NECK DOWN.   Do not use on face/ open                           Wound or open sores. Avoid contact with eyes, ears mouth and genitals (private parts).                       Wash face,  Genitals (private parts) with your normal soap.             6.  Wash thoroughly, paying special attention to the area where your surgery  will be performed.  7.  Thoroughly rinse your body with warm water from the neck down.  8.  DO NOT shower/wash with your normal soap after using and rinsing off  the CHG Soap.                9.  Pat yourself dry with a clean towel.            10.  Wear clean pajamas.            11.  Place clean sheets on your bed the night of your first shower and do not  sleep with pets. Day of Surgery : Do not apply any lotions/deodorants the morning of surgery.  Please wear clean clothes to the hospital/surgery center.  FAILURE TO FOLLOW THESE INSTRUCTIONS MAY RESULT IN THE CANCELLATION OF YOUR SURGERY PATIENT SIGNATURE_________________________________  NURSE  SIGNATURE__________________________________  ________________________________________________________________________   Kyle Montoya  An incentive spirometer is a tool that can help keep your lungs clear and active. This tool measures how well you are filling your lungs with each breath. Taking long deep breaths may help reverse or decrease the chance of developing breathing (pulmonary) problems (especially infection) following:  A long period of time when you are unable to move or be active. BEFORE THE PROCEDURE   If the spirometer includes an indicator to show your best effort, your nurse or respiratory therapist will set it to a desired goal.  If possible, sit up straight  or lean slightly forward. Try not to slouch.  Hold the incentive spirometer in an upright position. INSTRUCTIONS FOR USE  1. Sit on the edge of your bed if possible, or sit up as far as you can in bed or on a chair. 2. Hold the incentive spirometer in an upright position. 3. Breathe out normally. 4. Place the mouthpiece in your mouth and seal your lips tightly around it. 5. Breathe in slowly and as deeply as possible, raising the piston or the ball toward the top of the column. 6. Hold your breath for 3-5 seconds or for as long as possible. Allow the piston or ball to fall to the bottom of the column. 7. Remove the mouthpiece from your mouth and breathe out normally. 8. Rest for a few seconds and repeat Steps 1 through 7 at least 10 times every 1-2 hours when you are awake. Take your time and take a few normal breaths between deep breaths. 9. The spirometer may include an indicator to show your best effort. Use the indicator as a goal to work toward during each repetition. 10. After each set of 10 deep breaths, practice coughing to be sure your lungs are clear. If you have an incision (the cut made at the time of surgery), support your incision when coughing by placing a pillow or rolled up towels firmly  against it. Once you are able to get out of bed, walk around indoors and cough well. You may stop using the incentive spirometer when instructed by your caregiver.  RISKS AND COMPLICATIONS  Take your time so you do not get dizzy or light-headed.  If you are in pain, you may need to take or ask for pain medication before doing incentive spirometry. It is harder to take a deep breath if you are having pain. AFTER USE  Rest and breathe slowly and easily.  It can be helpful to keep track of a log of your progress. Your caregiver can provide you with a simple table to help with this. If you are using the spirometer at home, follow these instructions: [place] IF:   You are having difficultly using the spirometer.  You have trouble using the spirometer as often as instructed.  Your pain medication is not giving enough relief while using the spirometer.  You develop fever of 100.5 F (38.1 C) or higher. SEEK IMMEDIATE MEDICAL CARE IF:   You cough up bloody sputum that had not been present before.  You develop fever of 102 F (38.9 C) or greater.  You develop worsening pain at or near the incision site. MAKE SURE YOU:   Understand these instructions.  Will watch your condition.  Will get help right away if you are not doing well or get worse. Document Released: 01/20/2007 Document Revised: 12/02/2011 Document Reviewed: 03/23/2007 [facility] Patient Information 2014 [place], [place].   ________________________________________________________________________

## 2020-09-21 ENCOUNTER — Encounter (HOSPITAL_COMMUNITY): Payer: Self-pay

## 2020-09-21 ENCOUNTER — Other Ambulatory Visit: Payer: Self-pay

## 2020-09-21 ENCOUNTER — Encounter (HOSPITAL_COMMUNITY)
Admission: RE | Admit: 2020-09-21 | Discharge: 2020-09-21 | Disposition: A | Discharge status: Home / Self Care | Payer: Medicare Other | Source: Ambulatory Visit | Attending: Nurse Practitioner | Admitting: Nurse Practitioner

## 2020-09-21 NOTE — Progress Notes (Addendum)
COVID Vaccine Completed: Yes Date COVID Vaccine completed: 06/2020 [place] COVID vaccine manufacturer: [place]     PCP - Philmore Pali: NP Cardiologist - Morene Rankins. LOV: 09/12/20  Chest x-ray -  EKG -  Stress Test -  ECHO -  Cardiac Cath -  Pacemaker/ICD device last checked:  Sleep Study -  CPAP -   Fasting Blood Sugar - N/A Checks Blood Sugar ___0__ times a day  Blood Thinner Instructions: Aspirin Instructions: Last Dose:  Anesthesia review: Hx: HTN,CAD,DIA.  Patient denies shortness of breath, fever, cough and chest pain at PAT appointment   Patient verbalized understanding of instructions that were given to them at the PAT appointment. Patient was also instructed that they will need to review over the PAT instructions again at home before surgery.

## 2020-09-21 NOTE — Progress Notes (Signed)
PAT interview was done over the phone,pt. Is having sore throat and is waiting for COVID test results.

## 2020-09-25 ENCOUNTER — Encounter (HOSPITAL_COMMUNITY)
Admission: RE | Admit: 2020-09-25 | Discharge: 2020-09-25 | Disposition: A | Discharge status: Home / Self Care | Payer: BC Managed Care – PPO | Source: Ambulatory Visit | Attending: Specialist | Admitting: Specialist

## 2020-09-25 ENCOUNTER — Other Ambulatory Visit: Payer: Self-pay

## 2020-09-25 ENCOUNTER — Other Ambulatory Visit (HOSPITAL_COMMUNITY)
Admission: RE | Admit: 2020-09-25 | Discharge: 2020-09-25 | Disposition: A | Discharge status: Home / Self Care | Payer: BC Managed Care – PPO | Source: Ambulatory Visit | Attending: Specialist | Admitting: Specialist

## 2020-09-25 ENCOUNTER — Ambulatory Visit: Payer: Self-pay | Admitting: Orthopedic Surgery

## 2020-09-25 DIAGNOSIS — Z85118 Personal history of other malignant neoplasm of bronchus and lung: Secondary | ICD-10-CM | POA: Insufficient documentation

## 2020-09-25 DIAGNOSIS — I2510 Atherosclerotic heart disease of native coronary artery without angina pectoris: Secondary | ICD-10-CM | POA: Diagnosis not present

## 2020-09-25 DIAGNOSIS — Z79899 Other long term (current) drug therapy: Secondary | ICD-10-CM | POA: Insufficient documentation

## 2020-09-25 DIAGNOSIS — M19011 Primary osteoarthritis, right shoulder: Secondary | ICD-10-CM | POA: Diagnosis not present

## 2020-09-25 DIAGNOSIS — Z79891 Long term (current) use of opiate analgesic: Secondary | ICD-10-CM | POA: Diagnosis not present

## 2020-09-25 DIAGNOSIS — I10 Essential (primary) hypertension: Secondary | ICD-10-CM | POA: Diagnosis not present

## 2020-09-25 DIAGNOSIS — C9290 Myeloid leukemia, unspecified, not having achieved remission: Secondary | ICD-10-CM | POA: Diagnosis not present

## 2020-09-25 DIAGNOSIS — Z20822 Contact with and (suspected) exposure to covid-19: Secondary | ICD-10-CM | POA: Diagnosis not present

## 2020-09-25 DIAGNOSIS — E1151 Type 2 diabetes mellitus with diabetic peripheral angiopathy without gangrene: Secondary | ICD-10-CM | POA: Insufficient documentation

## 2020-09-25 DIAGNOSIS — Z87891 Personal history of nicotine dependence: Secondary | ICD-10-CM | POA: Diagnosis not present

## 2020-09-25 DIAGNOSIS — M75101 Unspecified rotator cuff tear or rupture of right shoulder, not specified as traumatic: Secondary | ICD-10-CM | POA: Insufficient documentation

## 2020-09-25 DIAGNOSIS — Z01818 Encounter for other preprocedural examination: Secondary | ICD-10-CM | POA: Insufficient documentation

## 2020-09-25 DIAGNOSIS — Z887 Allergy status to serum and vaccine status: Secondary | ICD-10-CM | POA: Diagnosis not present

## 2020-09-25 LAB — BASIC METABOLIC PANEL
Anion gap: 8 (ref 5–15)
BUN: 10 mg/dL (ref 8–23)
CO2: 24 mmol/L (ref 22–32)
Calcium: 8 mg/dL — ABNORMAL LOW (ref 8.9–10.3)
Chloride: 103 mmol/L (ref 98–111)
Creatinine, Ser: 0.68 mg/dL (ref 0.61–1.24)
GFR, Estimated: >60 mL/min (ref >60)
Glucose, Bld: 105 mg/dL — ABNORMAL HIGH (ref 70–99)
Potassium: 3.5 mmol/L (ref 3.5–5.1)
Sodium: 135 mmol/L (ref 135–145)

## 2020-09-25 LAB — CBC
HCT: 36.1 % — ABNORMAL LOW (ref 39.0–52.0)
Hemoglobin: 13 g/dL (ref 13.0–17.0)
MCH: 34.4 pg — ABNORMAL HIGH (ref 26.0–34.0)
MCHC: 36 g/dL (ref 30.0–36.0)
MCV: 95.5 fL (ref 80.0–100.0)
Platelets: 197 10*3/uL (ref 150–400)
RBC: 3.78 MIL/uL — ABNORMAL LOW (ref 4.22–5.81)
RDW: 13.8 % (ref 11.5–15.5)
WBC: 4.2 10*3/uL (ref 4.0–10.5)
nRBC: 0 % (ref 0.0–0.2)

## 2020-09-25 LAB — GLUCOSE, CAPILLARY: Glucose-Capillary: 114 mg/dL — ABNORMAL HIGH (ref 70–99)

## 2020-09-25 LAB — HEMOGLOBIN A1C
Hgb A1c MFr Bld: 4.7 % — ABNORMAL LOW (ref 4.8–5.6)
Mean Plasma Glucose: 88.19 mg/dL

## 2020-09-25 NOTE — H&P (Signed)
Kyle Rogue Bussing Sr. is an 70 y.o. male.   Chief Complaint: R shoulder pain HPI: Reported by patient. Reason for Visit: Diagnositc Results (right shoulder MRI) Context: The patient is 5 1/2 months out from the onset of symptoms Location (Upper Extremity): shoulder pain on the right Severity: pain level 0/10 Medications: No medication for his shoulder  Past Medical History:  Diagnosis Date  . Arthritis   . Asthma   . BPH (benign prostatic hyperplasia)   . CAD (coronary artery disease)   . Cholangiocarcinoma of biliary tract ([place])   . Chronic myelogenous leukemia ([place])   . Diabetes mellitus   . DJD (degenerative joint disease)   . Dyslipidemia   . Family history of colonic polyps   . Gout   . HTN (hypertension)   . Hypercholesterolemia   . Lung cancer ([place])   . Myelogenous leukemia ([place])   . OA (osteoarthritis)   . PAD (peripheral artery disease) ([place])   . Renal insufficiency   . Tendonitis    patellar    Past Surgical History:  Procedure Laterality Date  . APPENDECTOMY    . Bowel duct surg  2014   Bowel cancer  . CARDIAC CATHETERIZATION  2000  . COLONOSCOPY  06/23/2015   mild sigmoid diverticulosis. small external and internal hemorrhoids. otherwise normal colonoscopy to terminal ileum  . LUNG CANCER SURGERY  Jan. 2016   partial lobectomy  . wipple procedure     stage I- due to bile duct cancer-2014 at [facility] (Dr Vernard Gambles). Stage IA cholangiocarcinoma    Family History  Problem Relation Age of Onset  . Prostate cancer Father 99       deceased  . Hypertension Mother   . Hypertension Brother   . Hypertension Sister   . Asthma Sister   . Colon cancer Neg Hx   . Esophageal cancer Neg Hx    Social History:  reports that he has quit smoking. His smoking use included cigarettes. He has a 10.00 pack-year smoking history. He has never used smokeless tobacco. He reports current alcohol use. He reports that he does not use drugs.  Allergies:  Allergies  Allergen  Reactions  . Pneumococcal Vaccines Swelling    (Not in a hospital admission)   Results for orders placed or performed during the hospital encounter of 09/25/20 (from the past 48 hour(s))  CBC     Status: Abnormal   Collection Time: 09/25/20  9:17 AM  Result Value Ref Range   WBC 4.2 4.0 - 10.5 K/uL   RBC 3.78 (L) 4.22 - 5.81 MIL/uL   Hemoglobin 13.0 13.0 - 17.0 g/dL   HCT 36.1 (L) 39.0 - 52.0 %   MCV 95.5 80.0 - 100.0 fL   MCH 34.4 (H) 26.0 - 34.0 pg   MCHC 36.0 30.0 - 36.0 g/dL   RDW 13.8 11.5 - 15.5 %   Platelets 197 150 - 400 K/uL   nRBC 0.0 0.0 - 0.2 %    Comment: Performed at [facility], [place] [address], [place], [place] [address]  Basic metabolic panel     Status: Abnormal   Collection Time: 09/25/20  9:17 AM  Result Value Ref Range   Sodium 135 135 - 145 mmol/L   Potassium 3.5 3.5 - 5.1 mmol/L   Chloride 103 98 - 111 mmol/L   CO2 24 22 - 32 mmol/L   Glucose, Bld 105 (H) 70 - 99 mg/dL    Comment: Glucose reference range applies only to  samples taken after fasting for at least 8 hours.   BUN 10 8 - 23 mg/dL   Creatinine, Ser 0.68 0.61 - 1.24 mg/dL   Calcium 8.0 (L) 8.9 - 10.3 mg/dL   GFR, Estimated >60 >60 mL/min    Comment: (NOTE) Calculated using the CKD-EPI Creatinine Equation (2021)    Anion gap 8 5 - 15    Comment: Performed at [facility], [place] [address] [place], [place] [address]  Hemoglobin A1c per protocol     Status: Abnormal   Collection Time: 09/25/20  9:17 AM  Result Value Ref Range   Hgb A1c MFr Bld 4.7 (L) 4.8 - 5.6 %    Comment: (NOTE) Pre diabetes:          5.7%-6.4%  Diabetes:              >6.4%  Glycemic control for   <7.0% adults with diabetes    Mean Plasma Glucose 88.19 mg/dL    Comment: Performed at [place] [address] [place], [place] [address]  Glucose, capillary     Status: Abnormal   Collection Time: 09/25/20  9:19 AM  Result Value Ref Range   Glucose-Capillary 114 (H) 70  - 99 mg/dL    Comment: Glucose reference range applies only to samples taken after fasting for at least 8 hours.   Medications: albuterol sulfate HFA 90 mcg/actuation aerosol inhaler allopurinoL 300 mg tablet Anoro Ellipta 62.5 mcg-25 mcg/actuation powder for inhalation colchicine 0.6 mg tablet fluticasone propionate 50 mcg/actuation nasal spray,suspension HYDROcodone 7.5 mg-acetaminophen 325 mg tablet losartan 100 mg-hydrochlorothiazide 25 mg tablet methocarbamoL 500 mg tablet SpryceL 100 mg tablet tamsulosin 0.4 mg capsule  No results found.  Review of Systems  Constitutional: Negative.   HENT: Negative.   Eyes: Negative.   Respiratory: Negative.   Cardiovascular: Negative.   Gastrointestinal: Negative.   Endocrine: Negative.   Genitourinary: Negative.   Musculoskeletal: Positive for arthralgias and myalgias.  Neurological: Negative.     There were no vitals taken for this visit. Physical Exam Constitutional:      Appearance: Normal appearance.  HENT:     Head: Normocephalic.     Right Ear: External ear normal.     Left Ear: External ear normal.     Nose: Nose normal.     Mouth/Throat:     Pharynx: Oropharynx is clear.  Eyes:     Conjunctiva/sclera: Conjunctivae normal.  Cardiovascular:     Rate and Rhythm: Normal rate.     Pulses: Normal pulses.  Pulmonary:     Effort: Pulmonary effort is normal.  Abdominal:     General: Bowel sounds are normal.  Musculoskeletal:     Cervical back: Normal range of motion.     Comments: Patient is a 70 year old male.  Constitutional: General Appearance: healthy-appearing and NAD.  Psychiatric: Mood and Affect: normal mood and affect.  Cardiovascular System: Arterial Pulses Right: radial normal and brachial normal. Varicosities Right: no varicosities.  C-Spine/Neck: Active Range of Motion: flexion normal, extension normal, and no pain elicited on motion.  Shoulders: Inspection Right: no misalignment, atrophy, erythema,  swelling, or scapular winging. Bony Palpation Right: no tenderness of the sternoclavicular joint, the coracoid process, the acromioclavicular joint, the bicipital groove, or the scapula. Soft Tissue Palpation Right: tenderness of the supraspinatus and the subacromial bursa. Active Range of Motion Right: limited. Special Tests Right: Speed's test negative and Neer's test positive. Stability Right: no laxity, sulcus sign negative, and anterior apprehension  test negative. Strength Right: abduction 5/5, adduction 5/5, flexion 5/5, and extension 5/5.  Skin: Right Upper Extremity: normal.  Neurological System: Biceps Reflex Right: normal (2). Brachioradialis Reflex Right: normal (2). Triceps Reflex Right: normal (2). Sensation on the Right: C5 normal, C6 normal, and C7 normal.  Patient is tender over the [facility] joint has a positive crossover maneuver.  Skin:    General: Skin is warm and dry.  Neurological:     Mental Status: He is alert.    MRI right shoulder indicates full-thickness tear of the supraspinatus with 4 cm of retraction with mild to moderate supraspinatus muscle atrophy. Tear of the anterior half of the infraspinatus with 1.5 cm of retraction. Moderate to severe AC arthrosis. Probable nonunited avulsion fracture of the lateral margin of the acromion. Associated atrophy of the lateral aspect of the deltoid. Degenerative tear of the labrum. No fatty atrophy   Assessment/Plan Impression:  Symptomatic retracted tear of the rotator cuff infraspinatus supraspinatus. No fatty atrophy. History of a avulsion injury of a small portion of the deltoid. Symptomatic AC arthrosis  Plan:  Given the disabling pain the presence of the retracted tear we discussed a rotator cuff repair distal clavicle resection possible patch graft  An extensive discussion concerning the pathology relevant anatomy and treatment options. After that discussion we mutually agreed to proceed with repair of the rotator cuff  utilizing arthroscopic assistance if possible. The risks and benefits of that procedure were discussed including bleeding, infection, suboptimal range of motion, deep venous thrombosis, pulmonary embolism, anesthetic complications etc. in addition we discussed the postoperative course to include approximately 4 weeks of passive range of motion followed by 4 weeks of active range of motion followed by 4-12 weeks of progressive strengthening exercises. In addition we discussed protective activities to reduce the risk of a reinjury including impingement activities with elbow above the shoulder as well as reaching and repetitive circular motion activities. The hospital stay will either be as a outpatient with a regional block versus overnight depending upon the extent of the procedure and any challenging health issues with a first postoperative visit 2 weeks following the surgery.  We discussed possibility of inability to repair the tendon requiring shoulder replacement.  We also discussed perioperative prophylactic colchicine to avoid a gout precipitation due to the increased risk of that following surgical procedures  No history of MRSA. Percocet postoperatively. He is currently on hydrocodone. Outpatient was discussed. Regional block. Preoperative clearance. Kefzol.  Plan R shoulder mini-open RCR, DCR, possible patch graft  Cecilie Kicks, PA-C for Dr. Tonita Cong 09/25/2020, 11:48 AM

## 2020-09-25 NOTE — H&P (View-Only) (Signed)
Kyle Rogue Bussing Sr. is an 70 y.o. male.   Chief Complaint: R shoulder pain HPI: Reported by patient. Reason for Visit: Diagnositc Results (right shoulder MRI) Context: The patient is 5 1/2 months out from the onset of symptoms Location (Upper Extremity): shoulder pain on the right Severity: pain level 0/10 Medications: No medication for his shoulder  Past Medical History:  Diagnosis Date  . Arthritis   . Asthma   . BPH (benign prostatic hyperplasia)   . CAD (coronary artery disease)   . Cholangiocarcinoma of biliary tract ([place])   . Chronic myelogenous leukemia ([place])   . Diabetes mellitus   . DJD (degenerative joint disease)   . Dyslipidemia   . Family history of colonic polyps   . Gout   . HTN (hypertension)   . Hypercholesterolemia   . Lung cancer ([place])   . Myelogenous leukemia ([place])   . OA (osteoarthritis)   . PAD (peripheral artery disease) ([place])   . Renal insufficiency   . Tendonitis    patellar    Past Surgical History:  Procedure Laterality Date  . APPENDECTOMY    . Bowel duct surg  2014   Bowel cancer  . CARDIAC CATHETERIZATION  2000  . COLONOSCOPY  06/23/2015   mild sigmoid diverticulosis. small external and internal hemorrhoids. otherwise normal colonoscopy to terminal ileum  . LUNG CANCER SURGERY  Jan. 2016   partial lobectomy  . wipple procedure     stage I- due to bile duct cancer-2014 at [facility] (Dr Vernard Gambles). Stage IA cholangiocarcinoma    Family History  Problem Relation Age of Onset  . Prostate cancer Father 77       deceased  . Hypertension Mother   . Hypertension Brother   . Hypertension Sister   . Asthma Sister   . Colon cancer Neg Hx   . Esophageal cancer Neg Hx    Social History:  reports that he has quit smoking. His smoking use included cigarettes. He has a 10.00 pack-year smoking history. He has never used smokeless tobacco. He reports current alcohol use. He reports that he does not use drugs.  Allergies:  Allergies  Allergen  Reactions  . Pneumococcal Vaccines Swelling    (Not in a hospital admission)   Results for orders placed or performed during the hospital encounter of 09/25/20 (from the past 48 hour(s))  CBC     Status: Abnormal   Collection Time: 09/25/20  9:17 AM  Result Value Ref Range   WBC 4.2 4.0 - 10.5 K/uL   RBC 3.78 (L) 4.22 - 5.81 MIL/uL   Hemoglobin 13.0 13.0 - 17.0 g/dL   HCT 36.1 (L) 39.0 - 52.0 %   MCV 95.5 80.0 - 100.0 fL   MCH 34.4 (H) 26.0 - 34.0 pg   MCHC 36.0 30.0 - 36.0 g/dL   RDW 13.8 11.5 - 15.5 %   Platelets 197 150 - 400 K/uL   nRBC 0.0 0.0 - 0.2 %    Comment: Performed at [facility], [place] [address] [place], [place] [address]  Basic metabolic panel     Status: Abnormal   Collection Time: 09/25/20  9:17 AM  Result Value Ref Range   Sodium 135 135 - 145 mmol/L   Potassium 3.5 3.5 - 5.1 mmol/L   Chloride 103 98 - 111 mmol/L   CO2 24 22 - 32 mmol/L   Glucose, Bld 105 (H) 70 - 99 mg/dL    Comment: Glucose reference range applies only to  samples taken after fasting for at least 8 hours.   BUN 10 8 - 23 mg/dL   Creatinine, Ser 0.68 0.61 - 1.24 mg/dL   Calcium 8.0 (L) 8.9 - 10.3 mg/dL   GFR, Estimated >60 >60 mL/min    Comment: (NOTE) Calculated using the CKD-EPI Creatinine Equation (2021)    Anion gap 8 5 - 15    Comment: Performed at [facility], [place] [address] [place], [place] [address]  Hemoglobin A1c per protocol     Status: Abnormal   Collection Time: 09/25/20  9:17 AM  Result Value Ref Range   Hgb A1c MFr Bld 4.7 (L) 4.8 - 5.6 %    Comment: (NOTE) Pre diabetes:          5.7%-6.4%  Diabetes:              >6.4%  Glycemic control for   <7.0% adults with diabetes    Mean Plasma Glucose 88.19 mg/dL    Comment: Performed at [place] [address] [place], [place] [address]  Glucose, capillary     Status: Abnormal   Collection Time: 09/25/20  9:19 AM  Result Value Ref Range   Glucose-Capillary 114 (H) 70  - 99 mg/dL    Comment: Glucose reference range applies only to samples taken after fasting for at least 8 hours.   Medications: albuterol sulfate HFA 90 mcg/actuation aerosol inhaler allopurinoL 300 mg tablet Anoro Ellipta 62.5 mcg-25 mcg/actuation powder for inhalation colchicine 0.6 mg tablet fluticasone propionate 50 mcg/actuation nasal spray,suspension HYDROcodone 7.5 mg-acetaminophen 325 mg tablet losartan 100 mg-hydrochlorothiazide 25 mg tablet methocarbamoL 500 mg tablet SpryceL 100 mg tablet tamsulosin 0.4 mg capsule  No results found.  Review of Systems  Constitutional: Negative.   HENT: Negative.   Eyes: Negative.   Respiratory: Negative.   Cardiovascular: Negative.   Gastrointestinal: Negative.   Endocrine: Negative.   Genitourinary: Negative.   Musculoskeletal: Positive for arthralgias and myalgias.  Neurological: Negative.     There were no vitals taken for this visit. Physical Exam Constitutional:      Appearance: Normal appearance.  HENT:     Head: Normocephalic.     Right Ear: External ear normal.     Left Ear: External ear normal.     Nose: Nose normal.     Mouth/Throat:     Pharynx: Oropharynx is clear.  Eyes:     Conjunctiva/sclera: Conjunctivae normal.  Cardiovascular:     Rate and Rhythm: Normal rate.     Pulses: Normal pulses.  Pulmonary:     Effort: Pulmonary effort is normal.  Abdominal:     General: Bowel sounds are normal.  Musculoskeletal:     Cervical back: Normal range of motion.     Comments: Patient is a 70 year old male.  Constitutional: General Appearance: healthy-appearing and NAD.  Psychiatric: Mood and Affect: normal mood and affect.  Cardiovascular System: Arterial Pulses Right: radial normal and brachial normal. Varicosities Right: no varicosities.  C-Spine/Neck: Active Range of Motion: flexion normal, extension normal, and no pain elicited on motion.  Shoulders: Inspection Right: no misalignment, atrophy, erythema,  swelling, or scapular winging. Bony Palpation Right: no tenderness of the sternoclavicular joint, the coracoid process, the acromioclavicular joint, the bicipital groove, or the scapula. Soft Tissue Palpation Right: tenderness of the supraspinatus and the subacromial bursa. Active Range of Motion Right: limited. Special Tests Right: Speed's test negative and Neer's test positive. Stability Right: no laxity, sulcus sign negative, and anterior apprehension  test negative. Strength Right: abduction 5/5, adduction 5/5, flexion 5/5, and extension 5/5.  Skin: Right Upper Extremity: normal.  Neurological System: Biceps Reflex Right: normal (2). Brachioradialis Reflex Right: normal (2). Triceps Reflex Right: normal (2). Sensation on the Right: C5 normal, C6 normal, and C7 normal.  Patient is tender over the [facility] joint has a positive crossover maneuver.  Skin:    General: Skin is warm and dry.  Neurological:     Mental Status: He is alert.    MRI right shoulder indicates full-thickness tear of the supraspinatus with 4 cm of retraction with mild to moderate supraspinatus muscle atrophy. Tear of the anterior half of the infraspinatus with 1.5 cm of retraction. Moderate to severe AC arthrosis. Probable nonunited avulsion fracture of the lateral margin of the acromion. Associated atrophy of the lateral aspect of the deltoid. Degenerative tear of the labrum. No fatty atrophy   Assessment/Plan Impression:  Symptomatic retracted tear of the rotator cuff infraspinatus supraspinatus. No fatty atrophy. History of a avulsion injury of a small portion of the deltoid. Symptomatic AC arthrosis  Plan:  Given the disabling pain the presence of the retracted tear we discussed a rotator cuff repair distal clavicle resection possible patch graft  An extensive discussion concerning the pathology relevant anatomy and treatment options. After that discussion we mutually agreed to proceed with repair of the rotator cuff  utilizing arthroscopic assistance if possible. The risks and benefits of that procedure were discussed including bleeding, infection, suboptimal range of motion, deep venous thrombosis, pulmonary embolism, anesthetic complications etc. in addition we discussed the postoperative course to include approximately 4 weeks of passive range of motion followed by 4 weeks of active range of motion followed by 4-12 weeks of progressive strengthening exercises. In addition we discussed protective activities to reduce the risk of a reinjury including impingement activities with elbow above the shoulder as well as reaching and repetitive circular motion activities. The hospital stay will either be as a outpatient with a regional block versus overnight depending upon the extent of the procedure and any challenging health issues with a first postoperative visit 2 weeks following the surgery.  We discussed possibility of inability to repair the tendon requiring shoulder replacement.  We also discussed perioperative prophylactic colchicine to avoid a gout precipitation due to the increased risk of that following surgical procedures  No history of MRSA. Percocet postoperatively. He is currently on hydrocodone. Outpatient was discussed. Regional block. Preoperative clearance. Kefzol.  Plan R shoulder mini-open RCR, DCR, possible patch graft  Cecilie Kicks, PA-C for Dr. Tonita Cong 09/25/2020, 11:48 AM

## 2020-09-26 LAB — SARS CORONAVIRUS 2 (TAT 6-24 HRS): SARS Coronavirus 2: NEGATIVE

## 2020-09-26 NOTE — Progress Notes (Signed)
Anesthesia Chart Review   Case: [id number] Date/Time: 09/27/20 0815   Procedure: Right shoulder mini open rotator cuff repair, distal clavicle resection, possible patch graft (Right ) - 90 mins  Choice with Block anesthesia   Anesthesia type: Choice   Pre-op diagnosis: Right shoulder rotator cuff tear, acromioclavicular arthrosis   Location: [facility] ROOM 05 / [facility]   Surgeons: Susa Day, MD      DISCUSSION:69 y.o. former smoker with h/o HTN, PAD, DM II, nonobstructive CAD on cath 2010, myelogenous leukemia (Dasatinib 100mg  daily), lung cancer s/p left upper lobectomy 10/27/2014, right shoulder rotator cuff tear scheduled for above procedure 09/27/2020 with Dr. Susa Day.   Pt seen by PCP 08/29/2020 for preop evaluation.  Per OV note, "Cleared from a medical standpoint for rotator cuff repair. Considered low risk for this procedure."  Anticipate pt can proceed with planned procedure barring acute status change.   VS: Ht 5\' 10"  (1.778 m)   Wt 99.8 kg   BMI 31.57 kg/m   PROVIDERS: Philmore Pali, NP is PCP    LABS: Labs reviewed: Acceptable for surgery. (all labs ordered are listed, but only abnormal results are displayed)  Labs Reviewed  CBC - Abnormal; Notable for the following components:      Result Value   RBC 3.78 (*)    HCT 36.1 (*)    MCH 34.4 (*)    All other components within normal limits  BASIC METABOLIC PANEL - Abnormal; Notable for the following components:   Glucose, Bld 105 (*)    Calcium 8.0 (*)    All other components within normal limits  HEMOGLOBIN A1C - Abnormal; Notable for the following components:   Hgb A1c MFr Bld 4.7 (*)    All other components within normal limits  GLUCOSE, CAPILLARY - Abnormal; Notable for the following components:   Glucose-Capillary 114 (*)    All other components within normal limits     IMAGES:   EKG: 09/25/2020 Rate 68 bpm  NSR RBBB  CV: Cardiac Cath 04/06/2009 1. No significant obstructive atherosclerotic coronary  artery disease.  2. Normal left ventricular function  Past Medical History:  Diagnosis Date  . Arthritis   . Asthma   . BPH (benign prostatic hyperplasia)   . CAD (coronary artery disease)   . Cholangiocarcinoma of biliary tract ([place])   . Chronic myelogenous leukemia ([place])   . Diabetes mellitus   . DJD (degenerative joint disease)   . Dyslipidemia   . Family history of colonic polyps   . Gout   . HTN (hypertension)   . Hypercholesterolemia   . Lung cancer ([place])   . Myelogenous leukemia ([place])   . OA (osteoarthritis)   . PAD (peripheral artery disease) ([place])   . Renal insufficiency   . Tendonitis    patellar    Past Surgical History:  Procedure Laterality Date  . APPENDECTOMY    . Bowel duct surg  2014   Bowel cancer  . CARDIAC CATHETERIZATION  2000  . COLONOSCOPY  06/23/2015   mild sigmoid diverticulosis. small external and internal hemorrhoids. otherwise normal colonoscopy to terminal ileum  . LUNG CANCER SURGERY  Jan. 2016   partial lobectomy  . wipple procedure     stage I- due to bile duct cancer-2014 at [facility] (Dr Vernard Gambles). Stage IA cholangiocarcinoma    MEDICATIONS: . albuterol (PROVENTIL HFA;VENTOLIN HFA) 108 (90 BASE) MCG/ACT inhaler  . allopurinol (ZYLOPRIM) 300 MG tablet  . calcium carbonate (TUMS - DOSED IN MG ELEMENTAL  CALCIUM) 500 MG chewable tablet  . Cholecalciferol (VITAMIN D) 50 MCG (2000 UT) tablet  . dasatinib (SPRYCEL) 100 MG tablet  . fluticasone (FLONASE) 50 MCG/ACT nasal spray  . HYDROcodone-acetaminophen (NORCO) 7.5-325 MG tablet  . losartan-hydrochlorothiazide (HYZAAR) 100-25 MG tablet  . methocarbamol (ROBAXIN) 500 MG tablet  . pantoprazole (PROTONIX) 40 MG tablet  . tamsulosin (FLOMAX) 0.4 MG CAPS capsule  . umeclidinium-vilanterol (ANORO ELLIPTA) 62.5-25 MCG/INH AEPB   No current facility-administered medications for this encounter.    Konrad Felix, PA-C [facility]Surgical [facility] [phone]

## 2020-09-26 NOTE — Anesthesia Preprocedure Evaluation (Addendum)
Anesthesia Evaluation  Patient identified by MRN, date of birth, ID band Patient awake    Reviewed: Allergy & Precautions, NPO status , Patient's Chart, lab work & pertinent test results  Airway Mallampati: I  TM Distance: >3 FB Neck ROM: Full    Dental  (+) Edentulous Upper, Edentulous Lower   Pulmonary asthma , COPD, Patient abstained from smoking., former smoker,  10 pack year history  lung cancer s/p left upper lobectomy 10/27/2014 Albuterol use 1-2x/wk   Pulmonary exam normal breath sounds clear to auscultation       Cardiovascular hypertension, Pt. on medications + CAD (s/p cath 2000) and + Peripheral Vascular Disease  Normal cardiovascular exam Rhythm:Regular Rate:Normal     Neuro/Psych negative neurological ROS  negative psych ROS   GI/Hepatic Neg liver ROS, GERD  Medicated and Controlled,  Endo/Other  negative endocrine ROSObesity BMI 32  Renal/GU negative Renal ROS  negative genitourinary   Musculoskeletal  (+) Arthritis , Osteoarthritis,  Right shoulder rotator cuff tear, AC arthritis   Abdominal   Peds  Hematology negative hematology ROS (+) CML hct 36.1   Anesthesia Other Findings   Reproductive/Obstetrics negative OB ROS                         Anesthesia Physical Anesthesia Plan  ASA: III  Anesthesia Plan: General and Regional   Post-op Pain Management: GA combined w/ Regional for post-op pain   Induction: Intravenous  PONV Risk Score and Plan: 2 and Ondansetron, Dexamethasone and Treatment may vary due to age or medical condition  Airway Management Planned: Oral ETT  Additional Equipment: None  Intra-op Plan:   Post-operative Plan: Extubation in OR  Informed Consent: I have reviewed the patients History and Physical, chart, labs and discussed the procedure including the risks, benefits and alternatives for the proposed anesthesia with the patient or authorized  representative who has indicated his/her understanding and acceptance.     Dental advisory given  Plan Discussed with: CRNA  Anesthesia Plan Comments:       Anesthesia Quick Evaluation

## 2020-09-27 ENCOUNTER — Ambulatory Visit (HOSPITAL_COMMUNITY): Payer: BC Managed Care – PPO | Admitting: Anesthesiology

## 2020-09-27 ENCOUNTER — Encounter (HOSPITAL_COMMUNITY)
Admission: RE | Disposition: A | Discharge status: Home / Self Care | Payer: Self-pay | Source: Home / Self Care | Attending: Specialist

## 2020-09-27 ENCOUNTER — Encounter (HOSPITAL_COMMUNITY): Payer: Self-pay | Admitting: Specialist

## 2020-09-27 ENCOUNTER — Ambulatory Visit (HOSPITAL_COMMUNITY)
Admission: RE | Admit: 2020-09-27 | Discharge: 2020-09-27 | Disposition: A | Discharge status: Home / Self Care | Payer: BC Managed Care – PPO | Attending: Specialist | Admitting: Specialist

## 2020-09-27 ENCOUNTER — Ambulatory Visit (HOSPITAL_COMMUNITY): Payer: BC Managed Care – PPO | Admitting: Physician Assistant

## 2020-09-27 DIAGNOSIS — M75121 Complete rotator cuff tear or rupture of right shoulder, not specified as traumatic: Secondary | ICD-10-CM | POA: Diagnosis not present

## 2020-09-27 DIAGNOSIS — Z87891 Personal history of nicotine dependence: Secondary | ICD-10-CM | POA: Insufficient documentation

## 2020-09-27 DIAGNOSIS — E785 Hyperlipidemia, unspecified: Secondary | ICD-10-CM | POA: Diagnosis not present

## 2020-09-27 DIAGNOSIS — G8918 Other acute postprocedural pain: Secondary | ICD-10-CM | POA: Diagnosis not present

## 2020-09-27 DIAGNOSIS — Z20822 Contact with and (suspected) exposure to covid-19: Secondary | ICD-10-CM | POA: Insufficient documentation

## 2020-09-27 DIAGNOSIS — Z79899 Other long term (current) drug therapy: Secondary | ICD-10-CM | POA: Diagnosis not present

## 2020-09-27 DIAGNOSIS — Z887 Allergy status to serum and vaccine status: Secondary | ICD-10-CM | POA: Diagnosis not present

## 2020-09-27 DIAGNOSIS — M19011 Primary osteoarthritis, right shoulder: Secondary | ICD-10-CM | POA: Insufficient documentation

## 2020-09-27 DIAGNOSIS — M75101 Unspecified rotator cuff tear or rupture of right shoulder, not specified as traumatic: Secondary | ICD-10-CM | POA: Insufficient documentation

## 2020-09-27 HISTORY — PX: SHOULDER OPEN ROTATOR CUFF REPAIR: SHX2407

## 2020-09-27 SURGERY — REPAIR, ROTATOR CUFF, OPEN
Anesthesia: Regional | Site: Shoulder | Laterality: Right

## 2020-09-27 MED ORDER — FENTANYL CITRATE (PF) 100 MCG/2ML IJ SOLN
INTRAMUSCULAR | Status: AC
  Filled 2020-09-27: qty 2

## 2020-09-27 MED ORDER — ROCURONIUM BROMIDE 100 MG/10ML IV SOLN
INTRAVENOUS | Status: DC | PRN
  Administered 2020-09-27: 100 mg via INTRAVENOUS

## 2020-09-27 MED ORDER — OXYCODONE HCL 5 MG PO TABS: 5.0000 mg | ORAL_TABLET | Freq: Once | ORAL | Status: DC | PRN

## 2020-09-27 MED ORDER — PROPOFOL 10 MG/ML IV BOLUS
INTRAVENOUS | Status: AC
  Filled 2020-09-27: qty 20

## 2020-09-27 MED ORDER — OXYCODONE-ACETAMINOPHEN 5-325 MG PO TABS: 1.0000 | ORAL_TABLET | ORAL | 0 refills | Status: DC | PRN

## 2020-09-27 MED ORDER — PHENYLEPHRINE HCL-NACL 10-0.9 MG/250ML-% IV SOLN
INTRAVENOUS | Status: DC | PRN
  Administered 2020-09-27: 30 ug/min via INTRAVENOUS

## 2020-09-27 MED ORDER — CEFAZOLIN SODIUM-DEXTROSE 2-4 GM/100ML-% IV SOLN: 2.0000 g | Freq: Once | INTRAVENOUS | Status: DC

## 2020-09-27 MED ORDER — MIDAZOLAM HCL 5 MG/5ML IJ SOLN
INTRAMUSCULAR | Status: DC | PRN
  Administered 2020-09-27: 2 mg via INTRAVENOUS

## 2020-09-27 MED ORDER — ONDANSETRON HCL 4 MG/2ML IJ SOLN: 4.0000 mg | Freq: Once | INTRAMUSCULAR | Status: DC | PRN

## 2020-09-27 MED ORDER — ROCURONIUM BROMIDE 10 MG/ML (PF) SYRINGE
PREFILLED_SYRINGE | INTRAVENOUS | Status: AC
  Filled 2020-09-27: qty 10

## 2020-09-27 MED ORDER — LACTATED RINGERS IV SOLN: INTRAVENOUS | Status: DC

## 2020-09-27 MED ORDER — DEXAMETHASONE SODIUM PHOSPHATE 10 MG/ML IJ SOLN
INTRAMUSCULAR | Status: AC
  Filled 2020-09-27: qty 1

## 2020-09-27 MED ORDER — FENTANYL CITRATE (PF) 100 MCG/2ML IJ SOLN
INTRAMUSCULAR | Status: DC | PRN
  Administered 2020-09-27 (×2): 50 ug via INTRAVENOUS

## 2020-09-27 MED ORDER — LIDOCAINE HCL (CARDIAC) PF 100 MG/5ML IV SOSY
PREFILLED_SYRINGE | INTRAVENOUS | Status: DC | PRN
  Administered 2020-09-27: 60 mg via INTRAVENOUS

## 2020-09-27 MED ORDER — LIDOCAINE HCL (PF) 2 % IJ SOLN
INTRAMUSCULAR | Status: AC
  Filled 2020-09-27: qty 5

## 2020-09-27 MED ORDER — CHLORHEXIDINE GLUCONATE 0.12 % MT SOLN
15.0000 mL | Freq: Once | OROMUCOSAL | Status: AC
  Administered 2020-09-27: 07:00:00 15 mL via OROMUCOSAL

## 2020-09-27 MED ORDER — BUPIVACAINE-EPINEPHRINE 0.5% -1:200000 IJ SOLN
INTRAMUSCULAR | Status: DC | PRN
  Administered 2020-09-27: 5 mL

## 2020-09-27 MED ORDER — POLYETHYLENE GLYCOL 3350 17 G PO PACK: 17.0000 g | PACK | Freq: Every day | ORAL | 0 refills | Status: DC

## 2020-09-27 MED ORDER — OXYCODONE HCL 5 MG/5ML PO SOLN: 5.0000 mg | Freq: Once | ORAL | Status: DC | PRN

## 2020-09-27 MED ORDER — CEFAZOLIN SODIUM-DEXTROSE 2-4 GM/100ML-% IV SOLN
2.0000 g | INTRAVENOUS | Status: AC
  Administered 2020-09-27: 2 g via INTRAVENOUS
  Filled 2020-09-27: qty 100

## 2020-09-27 MED ORDER — 0.9 % SODIUM CHLORIDE (POUR BTL) OPTIME
TOPICAL | Status: DC | PRN
  Administered 2020-09-27: 1000 mL

## 2020-09-27 MED ORDER — MIDAZOLAM HCL 2 MG/2ML IJ SOLN
INTRAMUSCULAR | Status: AC
  Filled 2020-09-27: qty 2

## 2020-09-27 MED ORDER — PROPOFOL 10 MG/ML IV BOLUS
INTRAVENOUS | Status: DC | PRN
  Administered 2020-09-27: 50 mg via INTRAVENOUS
  Administered 2020-09-27: 100 mg via INTRAVENOUS

## 2020-09-27 MED ORDER — CEPHALEXIN 500 MG PO CAPS: 500.0000 mg | ORAL_CAPSULE | Freq: Four times a day (QID) | ORAL | 1 refills | Status: DC

## 2020-09-27 MED ORDER — BUPIVACAINE-EPINEPHRINE 0.5% -1:200000 IJ SOLN
INTRAMUSCULAR | Status: AC
  Filled 2020-09-27: qty 1

## 2020-09-27 MED ORDER — ONDANSETRON HCL 4 MG/2ML IJ SOLN
INTRAMUSCULAR | Status: AC
  Filled 2020-09-27: qty 2

## 2020-09-27 MED ORDER — DEXAMETHASONE SODIUM PHOSPHATE 10 MG/ML IJ SOLN
INTRAMUSCULAR | Status: DC | PRN
  Administered 2020-09-27: 8 mg via INTRAVENOUS

## 2020-09-27 MED ORDER — ASPIRIN EC 81 MG PO TBEC: 81.0000 mg | DELAYED_RELEASE_TABLET | Freq: Every day | ORAL | 1 refills | Status: DC

## 2020-09-27 MED ORDER — DOCUSATE SODIUM 100 MG PO CAPS: 100.0000 mg | ORAL_CAPSULE | Freq: Two times a day (BID) | ORAL | 1 refills | Status: DC | PRN

## 2020-09-27 MED ORDER — BUPIVACAINE HCL (PF) 0.5 % IJ SOLN
INTRAMUSCULAR | Status: DC | PRN
  Administered 2020-09-27: 20 mL via PERINEURAL

## 2020-09-27 MED ORDER — ACETAMINOPHEN 500 MG PO TABS
1000.0000 mg | ORAL_TABLET | Freq: Once | ORAL | Status: AC
  Administered 2020-09-27: 1000 mg via ORAL
  Filled 2020-09-27: qty 2

## 2020-09-27 MED ORDER — ONDANSETRON HCL 4 MG/2ML IJ SOLN
INTRAMUSCULAR | Status: DC | PRN
  Administered 2020-09-27: 4 mg via INTRAVENOUS

## 2020-09-27 MED ORDER — PHENYLEPHRINE HCL (PRESSORS) 10 MG/ML IV SOLN
INTRAVENOUS | Status: AC
  Filled 2020-09-27: qty 1

## 2020-09-27 MED ORDER — EPINEPHRINE PF 1 MG/ML IJ SOLN
INTRAMUSCULAR | Status: AC
  Filled 2020-09-27: qty 2

## 2020-09-27 MED ORDER — ORAL CARE MOUTH RINSE: 15.0000 mL | Freq: Once | OROMUCOSAL | Status: AC

## 2020-09-27 MED ORDER — HYDROMORPHONE HCL 1 MG/ML IJ SOLN: 0.2500 mg | INTRAMUSCULAR | Status: DC | PRN

## 2020-09-27 MED ORDER — SUGAMMADEX SODIUM 200 MG/2ML IV SOLN
INTRAVENOUS | Status: DC | PRN
  Administered 2020-09-27: 100 mg via INTRAVENOUS
  Administered 2020-09-27: 200 mg via INTRAVENOUS

## 2020-09-27 SURGICAL SUPPLY — 57 items
ANCHOR NEEDLE 9/16 CIR SZ 8 (NEEDLE) IMPLANT
ANCHOR SWIVELOCK BIO 4.75X19.1 (Anchor) ×12 IMPLANT
BLADE OSCILLATING/SAGITTAL (BLADE) ×3
BLADE SURG SZ11 CARB STEEL (BLADE) ×3 IMPLANT
BLADE SW THK.38XMED LNG THN (BLADE) ×2 IMPLANT
CANNULA ACUFO 5X76 (CANNULA) ×3 IMPLANT
CANNULA SHOULDER 7CM (CANNULA) IMPLANT
CLSR STERI-STRIP ANTIMIC 1/2X4 (GAUZE/BANDAGES/DRESSINGS) ×3 IMPLANT
COVER SURGICAL LIGHT HANDLE (MISCELLANEOUS) ×3 IMPLANT
COVER WAND RF STERILE (DRAPES) IMPLANT
DRAPE STERI 35X30 U-POUCH (DRAPES) ×3 IMPLANT
DRAPE TOP 10253 STERILE (DRAPES) ×3 IMPLANT
DRESSING AQUACEL AG SP 3.5X6 (GAUZE/BANDAGES/DRESSINGS) ×2 IMPLANT
DRSG AQUACEL AG ADV 3.5X 4 (GAUZE/BANDAGES/DRESSINGS) IMPLANT
DRSG AQUACEL AG ADV 3.5X 6 (GAUZE/BANDAGES/DRESSINGS) IMPLANT
DRSG AQUACEL AG SP 3.5X6 (GAUZE/BANDAGES/DRESSINGS) ×3
DRSG PAD ABDOMINAL 8X10 ST (GAUZE/BANDAGES/DRESSINGS) IMPLANT
DURAPREP 26ML APPLICATOR (WOUND CARE) ×3 IMPLANT
ELECT NEEDLE TIP 2.8 STRL (NEEDLE) IMPLANT
ELECT REM PT RETURN 15FT ADLT (MISCELLANEOUS) ×3 IMPLANT
GLOVE BIOGEL PI IND STRL 7.5 (GLOVE) ×2 IMPLANT
GLOVE BIOGEL PI INDICATOR 7.5 (GLOVE) ×1
GLOVE SURG SS PI 7.5 STRL IVOR (GLOVE) ×6 IMPLANT
GLOVE SURG SS PI 8.0 STRL IVOR (GLOVE) ×6 IMPLANT
GOWN STRL REUS W/TWL XL LVL3 (GOWN DISPOSABLE) ×6 IMPLANT
IV CATH 14GX2 1/4 (CATHETERS) ×3 IMPLANT
KIT BASIN OR (CUSTOM PROCEDURE TRAY) IMPLANT
KIT TURNOVER KIT A (KITS) IMPLANT
NEEDLE SCORPION MULTI FIRE (NEEDLE) ×3 IMPLANT
NEEDLE SPNL 18GX3.5 QUINCKE PK (NEEDLE) ×3 IMPLANT
PACK SHOULDER (CUSTOM PROCEDURE TRAY) ×3 IMPLANT
PENCIL SMOKE EVACUATOR (MISCELLANEOUS) IMPLANT
PORT APPOLLO RF 90DEGREE MULTI (SURGICAL WAND) IMPLANT
RESTRAINT HEAD UNIVERSAL NS (MISCELLANEOUS) IMPLANT
SLING ARM FOAM STRAP LRG (SOFTGOODS) IMPLANT
SLING ULTRA II L (ORTHOPEDIC SUPPLIES) IMPLANT
STRIP CLOSURE SKIN 1/2X4 (GAUZE/BANDAGES/DRESSINGS) IMPLANT
SUT BONE WAX W31G (SUTURE) ×3 IMPLANT
SUT ETHIBOND 2 OS 4 DA (SUTURE) IMPLANT
SUT ETHILON 4 0 PS 2 18 (SUTURE) ×3 IMPLANT
SUT FIBERWIRE #2 38 T-5 BLUE (SUTURE)
SUT PROLENE 3 0 PS 2 (SUTURE) IMPLANT
SUT TIGER TAPE 7 IN WHITE (SUTURE) IMPLANT
SUT VIC AB 0 CT2 27 (SUTURE) ×3 IMPLANT
SUT VIC AB 1-0 CT2 27 (SUTURE) IMPLANT
SUT VIC AB 2-0 CT2 27 (SUTURE) IMPLANT
SUT VICRYL 0 TIES 12 18 (SUTURE) IMPLANT
SUT VICRYL 0 UR6 27IN ABS (SUTURE) ×3 IMPLANT
SUTURE FIBERWR #2 38 T-5 BLUE (SUTURE) IMPLANT
SYR 20ML LL LF (SYRINGE) ×3 IMPLANT
SYR CONTROL 10ML LL (SYRINGE) ×3 IMPLANT
TAPE FIBER 2MM 7IN #2 BLUE (SUTURE) IMPLANT
TOWEL OR 17X26 10 PK STRL BLUE (TOWEL DISPOSABLE) ×3 IMPLANT
TOWEL OR NON WOVEN STRL DISP B (DISPOSABLE) IMPLANT
TUBING ARTHROSCOPY IRRIG 16FT (MISCELLANEOUS) ×3 IMPLANT
WATER STERILE IRR 500ML POUR (IV SOLUTION) ×3 IMPLANT
WIPE CHG CHLORHEXIDINE 2% (PERSONAL CARE ITEMS) ×3 IMPLANT

## 2020-09-27 NOTE — Anesthesia Procedure Notes (Addendum)
Anesthesia Regional Block: Interscalene brachial plexus block   Pre-Anesthetic Checklist: ,, timeout performed, Correct Patient, Correct Site, Correct Laterality, Correct Procedure, Correct Position, site marked, Risks and benefits discussed,  Surgical consent,  Pre-op evaluation,  At surgeon's request and post-op pain management  Laterality: Right  Prep: Maximum Sterile Barrier Precautions used, chloraprep       Needles:  Injection technique: Single-shot  Needle Type: Echogenic Stimulator Needle     Needle Length: 9cm  Needle Gauge: 22     Additional Needles:   Procedures:,,,, ultrasound used (permanent image in chart),,,,  Narrative:  Start time: 09/27/2020 8:05 AM End time: 09/27/2020 8:15 AM Injection made incrementally with aspirations every 5 mL.  Performed by: Personally  Anesthesiologist: Pervis Hocking, DO  Additional Notes: Monitors applied. No increased pain on injection. No increased resistance to injection. Injection made in 5cc increments. Good needle visualization. Patient tolerated procedure well.

## 2020-09-27 NOTE — Discharge Instructions (Signed)

## 2020-09-27 NOTE — Transfer of Care (Signed)
Immediate Anesthesia Transfer of Care Note  Patient: Kyle Broom Sr.  Procedure(s) Performed: Right shoulder mini open rotator cuff repair, distal clavicle resection (Right Shoulder)  Patient Location: PACU  Anesthesia Type:General  Level of Consciousness: awake, alert , sedated and patient cooperative  Airway & Oxygen Therapy: Patient Spontanous Breathing and Patient connected to face mask oxygen  Post-op Assessment: Report given to RN and Post -op Vital signs reviewed and stable  Post vital signs: Reviewed and stable  Last Vitals:  Vitals Value Taken Time  BP 140/75 09/27/20 1030  Temp    Pulse 65 09/27/20 1030  Resp 14 09/27/20 1030  SpO2 100 % 09/27/20 1030  Vitals shown include unvalidated device data.  Last Pain:  Vitals:   09/27/20 0708  TempSrc:   PainSc: 0-No pain         Complications: No complications documented.

## 2020-09-27 NOTE — Evaluation (Signed)
Occupational Therapy Evaluation Patient Details Name: Kyle Maffeo Sr. MRN: [id number] DOB: 05-07-1951 Today's Date: 09/27/2020    History of Present Illness Patient s/p Mini open rotator cuff repair, with subacromial decompression and Mini open distal clavicle resection   Clinical Impression   Patient s/p shoulder surgery without functional use of right non-dominant upper extremity secondary to effects of surgery, interscalene block and shoulder precautions. Therapist provided education and instruction to patient in regards to exercises, precautions, positioning, donning upper extremity clothing and bathing while maintaining shoulder precautions, ice and edema management and donning/doffing sling. Patient verbalized understanding and demonstrated as needed. Patient needed assistance to donn shirt, sling and pull up clothing over right hip and provided with instruction on compensatory strategies to perform ADLs. Patient to follow up with MD for further therapy needs.      Follow Up Recommendations  Follow surgeon's recommendation for DC plan and follow-up therapies    Equipment Recommendations  None recommended by OT    Recommendations for Other Services       Precautions / Restrictions Precautions Precautions: Shoulder Type of Shoulder Precautions: No lifting for 6 weeks Shoulder Interventions: Don joy ultra sling;At all times;Off for dressing/bathing/exercises Precaution Booklet Issued:  (handout) Required Braces or Orthoses: Sling Restrictions Other Position/Activity Restrictions: Educate on pendulums per RN      Mobility Bed Mobility                    Transfers Overall transfer level: Modified independent                    Balance Overall balance assessment: Mild deficits observed, not formally tested                                         ADL either performed or assessed with clinical judgement   ADL   Eating/Feeding:  Modified independent   Grooming: Modified independent   Upper Body Bathing: Minimal assistance   Lower Body Bathing: Modified independent   Upper Body Dressing : Minimal assistance;Adhering to UE precautions   Lower Body Dressing: Minimal assistance Lower Body Dressing Details (indicate cue type and reason): min assist for pulilng up clothing over right hip Toilet Transfer: Modified Independent   Toileting- Clothing Manipulation and Hygiene: Minimal assistance Toileting - Clothing Manipulation Details (indicate cue type and reason): for pulling up clothing over right hip             Vision Patient Visual Report: No change from baseline       Perception     Praxis      Pertinent Vitals/Pain Pain Assessment: No/denies pain     Hand Dominance Left   Extremity/Trunk Assessment Upper Extremity Assessment Upper Extremity Assessment: RUE deficits/detail;LUE deficits/detail RUE: Unable to fully assess due to immobilization LUE Deficits / Details: WFL ROM and strength   Lower Extremity Assessment Lower Extremity Assessment: Overall WFL for tasks assessed   Cervical / Trunk Assessment Cervical / Trunk Assessment: Normal   Communication     Cognition Arousal/Alertness: Awake/alert Behavior During Therapy: WFL for tasks assessed/performed Overall Cognitive Status: Within Functional Limits for tasks assessed                                     General Comments  Exercises     Shoulder Instructions Shoulder Instructions Donning/doffing shirt without moving shoulder: Patient able to independently direct caregiver Method for sponge bathing under operated UE: Patient able to independently direct caregiver Donning/doffing sling/immobilizer: Patient able to independently direct caregiver Correct positioning of sling/immobilizer: Independent Pendulum exercises (written home exercise program): Independent ROM for elbow, wrist and digits of operated UE:  Independent Sling wearing schedule (on at all times/off for ADL's): Independent Proper positioning of operated UE when showering: Independent Dressing change: Independent Positioning of UE while sleeping: [place] expects to be discharged to:: Private residence Living Arrangements: Spouse/significant other Available Help at Discharge: Family;Available 24 hours/day Type of Home: House                                  Prior Functioning/Environment Level of Independence: Independent                 OT Problem List: Decreased strength;Decreased range of motion;Impaired UE functional use      OT Treatment/Interventions:      OT Goals(Current goals can be found in the care plan section) Acute Rehab OT Goals OT Goal Formulation: All assessment and education complete, DC therapy  OT Frequency:     Barriers to D/C:            Co-evaluation              AM-PAC OT "6 Clicks" Daily Activity     Outcome Measure Help from another person eating meals?: None Help from another person taking care of personal grooming?: None Help from another person toileting, which includes using toliet, bedpan, or urinal?: None Help from another person bathing (including washing, rinsing, drying)?: None Help from another person to put on and taking off regular upper body clothing?: A Little Help from another person to put on and taking off regular lower body clothing?: A Little 6 Click Score: 22   End of Session    Activity Tolerance: Patient tolerated treatment well Patient left: in chair;with family/visitor present                   Time: 1200-1228 OT Time Calculation (min): 28 min Charges:  OT General Charges $OT Visit: 1 Visit OT Evaluation $OT Eval Low Complexity: 1 Low OT Treatments $Self Care/Home Management : 8-22 mins  Ariel Wingrove, OTR/L [place]  Office [phone] Pager: [phone]   Lenward Chancellor 09/27/2020, 12:41 PM

## 2020-09-27 NOTE — Brief Op Note (Signed)
09/27/2020  10:01 AM  PATIENT:  Kyle Fantasia Sr.  70 y.o. male  PRE-OPERATIVE DIAGNOSIS:  Right shoulder rotator cuff tear, acromioclavicular arthrosis  POST-OPERATIVE DIAGNOSIS:  Right shoulder rotator cuff tear, acromioclavicular arthrosis  PROCEDURE:  Procedure(s) with comments: Right shoulder mini open rotator cuff repair, distal clavicle resection (Right) - 90 mins  Choice with Block anesthesia  SURGEON:  Surgeon(s) and Role:    Susa Day, MD - Primary  PHYSICIAN ASSISTANT:   ASSISTANTS: [place]   ANESTHESIA:   general  EBL:  50 mL   BLOOD ADMINISTERED:none  DRAINS: none   LOCAL MEDICATIONS USED:  MARCAINE     SPECIMEN:  No Specimen  DISPOSITION OF SPECIMEN:  N/A  COUNTS:  YES  TOURNIQUET:  * No tourniquets in log *  DICTATION: .Other Dictation: Dictation Number   [id number]  PLAN OF CARE: Discharge to home after PACU  PATIENT DISPOSITION:  PACU - hemodynamically stable.   Delay start of Pharmacological VTE agent (>24hrs) due to surgical blood loss or risk of bleeding: not applicable

## 2020-09-27 NOTE — Anesthesia Procedure Notes (Signed)
Procedure Name: Intubation Date/Time: 09/27/2020 8:42 AM Performed by: Garrel Ridgel, CRNA Pre-anesthesia Checklist: Patient identified, Emergency Drugs available, Suction available and Patient being monitored Patient Re-evaluated:Patient Re-evaluated prior to induction Oxygen Delivery Method: Circle system utilized Preoxygenation: Pre-oxygenation with 100% oxygen Induction Type: IV induction Ventilation: Mask ventilation without difficulty Laryngoscope Size: Mac and 4 Grade View: Grade I Tube type: Oral Tube size: 7.5 mm Number of attempts: 1 Airway Equipment and Method: Stylet and Oral airway Placement Confirmation: ETT inserted through vocal cords under direct vision,  positive ETCO2 and breath sounds checked- equal and bilateral Secured at: 23 cm Tube secured with: Tape Dental Injury: Teeth and Oropharynx as per pre-operative assessment

## 2020-09-27 NOTE — Op Note (Signed)
NAME: Kyle Montoya, Kyle Montoya MEDICAL RECORD OE:[id number] ACCOUNT [id number] DATE OF BIRTH:Mar 07, 1951 FACILITY: [facility] LOCATION: [facility] PHYSICIAN:Candance Bohlman Windy Kalata, MD  OPERATIVE REPORT  DATE OF PROCEDURE:  09/27/2020  PREOPERATIVE DIAGNOSIS:  Symptomatic AC arthrosis and rotator cuff tear right shoulder, massive.    POSTOPERATIVE DIAGNOSIS:  Symptomatic AC arthrosis and rotator cuff tear right shoulder, massive.    PROCEDURE PERFORMED: 1.  Mini open rotator cuff repair, supraspinatus, infraspinatus with 4 SwiveLock Arthrex suture anchors. 2.  Subacromial decompression, bursectomy, CA ligament release. 3.  Mini open distal clavicle resection through a separate incision.  ANESTHESIA:  General with a regional block.  ASSISTANT:  Lacie Draft, PA  HISTORY:  A 70 year old retracted tear of the rotator cuff symptomatic AC arthrosis indicated for repair of the rotator cuff and distal clavicle resection.  Risks and benefits discussed including bleeding, infection, damage to neurovascular structures,  no change in symptoms, worsening symptoms, DVT, PE, anesthetic complications, etc.  TECHNIQUE:  The patient in supine beach chair position.  After induction of adequate general anesthesia preceded by a scalene block, the right shoulder and upper extremity was prepped and draped in the usual sterile fashion.  A surgical marker was  utilized to delineate the acromion, AC joint and coracoid.  First, an incision was made over the [facility] joint, perpendicular to it approximately 3 cm in length.  Subcutaneous tissue was dissected with electrocautery was utilized to achieve hemostasis.   Marcaine 0.25% with epinephrine was infiltrated in subcutaneous tissue.  The deltotrapezial fascia was divided in line with skin incision.  Capsule was also divided in line with the skin incision using an AO elevator to skeletonize the distal clavicle  and protected the soft tissues anteriorly and posteriorly with baby  Hohmanns and the inferior portion of the clavicle and the rotator cuff was protected with a Network engineer.  Approximately 1 cm of the distal clavicle was then excised utilizing an  oscillating saw, osteotome and a bare rongeur.  I then undercut the inferior aspect of the distal clavicle with a 3 mm Kerrison removing the residual bone spurs.  Rotator cuff was intact.  It was copiously irrigated.  Bone wax placed on the distal  clavicle.  I closed the capsule with 0 Vicryl interrupted figure-of-eight sutures.  I made a 3 cm incision over the anterolateral aspect of the acromion separate to repair the rotator cuff.  Subcutaneous tissue was dissected.  Electrocautery was utilized  to achieve hemostasis.  The rhaphe between the anterolateral heads was divided in line with the skin incision.  A self-retaining retractor was then placed.  I entered the subacromial space, copiously irrigated.  Hypertrophic bursa was noted and this was  excised.  Using a 3 mm Kerrison to perform an acromioplasty of the anterolateral edge.  AO elevator to release the CA ligament.  I digitally lysed adhesions.  The rotator cuff anterior half of the infraspinatus and the supraspinatus was retracted  approximately 4 cm.  I mobilized these tendons on the bursal and articular surface with an Allis and a Cobb.  I was able to advance in 2 cm.  I then used an AO elevator and a bare rongeur to fashion a footprint in the greater tuberosity to good bleeding  bone.  I placed 2 SwiveLock suture anchors just lateral to the articular surface utilizing an awl separated by approximately 1.5 cm.  Both were placed, excellent resistance to pullout.  I used TigerTape and a Scorpion suture passer to pass 2 leaflets  posteriorly  through the tear and 2 leaflets anteriorly through the majority of the supraspinatus.  The tear was a crescent type tear.  I debrided the edges of the tear prior to this.  They were advanced to the greater tuberosity without difficulty  just  lateral to the articular surface a centimeter or two.  This is with the arm at its side.  The sutures were then crossed and placed in a second double row SwiveLocks after fashioning a pilot hole with an awl in the greater tuberosity.  We then anchored  these sutures.  With the arm in its side without undue tension.  This advanced the cuff and we had full coverage.  Redundant suture removed.  We examined the remainder of the cuff and it was intact.  Copiously irrigated the wound.  Electrocautery was  utilized to achieve hemostasis.  No active bleeding was noted.  I repaired the raphae with 0 Vicryl interrupted figure-of-eight sutures, subcu with 2-0 and skin with Prolene.  Sterile dressing was applied, placed in a sling and abduction pillow,  extubated without difficulty and transported to the recovery room in satisfactory condition.  The patient tolerated the procedure well.  No complications.  Assistant Lacie Draft, [place], was used throughout the case to apply traction to the arm and assistance in placement of the suture and anchors.  Blood loss was 25 mL.  HN/NUANCE  D:09/27/2020 T:09/27/2020 JOB:[id number]

## 2020-09-27 NOTE — Interval H&P Note (Signed)
History and Physical Interval Note:  09/27/2020 8:27 AM  Daegon Rogue Bussing Sr.  has presented today for surgery, with the diagnosis of Right shoulder rotator cuff tear, acromioclavicular arthrosis.  The various methods of treatment have been discussed with the patient and family. After consideration of risks, benefits and other options for treatment, the patient has consented to  Procedure(s) with comments: Right shoulder mini open rotator cuff repair, distal clavicle resection, possible patch graft (Right) - 90 mins  Choice with Block anesthesia as a surgical intervention.  The patient's history has been reviewed, patient examined, no change in status, stable for surgery.  I have reviewed the patient's chart and labs.  Questions were answered to the patient's satisfaction.     Kyle Montoya

## 2020-09-27 NOTE — Interval H&P Note (Signed)
History and Physical Interval Note:  09/27/2020 8:26 AM  Kyle Rogue Bussing Sr.  has presented today for surgery, with the diagnosis of Right shoulder rotator cuff tear, acromioclavicular arthrosis.  The various methods of treatment have been discussed with the patient and family. After consideration of risks, benefits and other options for treatment, the patient has consented to  Procedure(s) with comments: Right shoulder mini open rotator cuff repair, distal clavicle resection, possible patch graft (Right) - 90 mins  Choice with Block anesthesia as a surgical intervention.  The patient's history has been reviewed, patient examined, no change in status, stable for surgery.  I have reviewed the patient's chart and labs.  Questions were answered to the patient's satisfaction.     Johnn Hai

## 2020-09-27 NOTE — Anesthesia Postprocedure Evaluation (Signed)
Anesthesia Post Note  Patient: Kyle Montoya.  Procedure(s) Performed: Right shoulder mini open rotator cuff repair, distal clavicle resection (Right Shoulder)     Patient location during evaluation: PACU Anesthesia Type: Regional and General Level of consciousness: awake and alert, oriented and patient cooperative Pain management: pain level controlled Vital Signs Assessment: post-procedure vital signs reviewed and stable Respiratory status: spontaneous breathing, nonlabored ventilation and respiratory function stable Cardiovascular status: blood pressure returned to baseline and stable Postop Assessment: no apparent nausea or vomiting Anesthetic complications: no   No complications documented.  Last Vitals:  Vitals:   09/27/20 1125 09/27/20 1238  BP: 137/83 132/85  Pulse: 65 72  Resp: 14 15  Temp: 36.5 C 36.6 C  SpO2: 96% 97%    Last Pain:  Vitals:   09/27/20 1238  TempSrc:   PainSc: 0-No pain                 Pervis Hocking

## 2020-09-28 ENCOUNTER — Encounter (HOSPITAL_COMMUNITY): Payer: Self-pay | Admitting: Specialist

## 2020-09-29 ENCOUNTER — Telehealth: Payer: Self-pay

## 2020-09-29 DIAGNOSIS — M109 Gout, unspecified: Secondary | ICD-10-CM | POA: Diagnosis not present

## 2020-09-29 DIAGNOSIS — C9210 Chronic myeloid leukemia, BCR/ABL-positive, not having achieved remission: Secondary | ICD-10-CM | POA: Diagnosis not present

## 2020-09-29 DIAGNOSIS — Z6833 Body mass index (BMI) 33.0-33.9, adult: Secondary | ICD-10-CM | POA: Diagnosis not present

## 2020-09-29 DIAGNOSIS — J45909 Unspecified asthma, uncomplicated: Secondary | ICD-10-CM | POA: Diagnosis not present

## 2020-09-29 DIAGNOSIS — G8929 Other chronic pain: Secondary | ICD-10-CM | POA: Diagnosis not present

## 2020-09-29 DIAGNOSIS — D519 Vitamin B12 deficiency anemia, unspecified: Secondary | ICD-10-CM | POA: Diagnosis not present

## 2020-09-29 DIAGNOSIS — Z720 Tobacco use: Secondary | ICD-10-CM | POA: Diagnosis not present

## 2020-09-29 DIAGNOSIS — Z9889 Other specified postprocedural states: Secondary | ICD-10-CM | POA: Diagnosis not present

## 2020-09-29 DIAGNOSIS — M75101 Unspecified rotator cuff tear or rupture of right shoulder, not specified as traumatic: Secondary | ICD-10-CM | POA: Diagnosis not present

## 2020-09-29 DIAGNOSIS — I10 Essential (primary) hypertension: Secondary | ICD-10-CM | POA: Diagnosis not present

## 2020-09-29 DIAGNOSIS — M12811 Other specific arthropathies, not elsewhere classified, right shoulder: Secondary | ICD-10-CM | POA: Diagnosis not present

## 2020-09-29 NOTE — Telephone Encounter (Signed)
[place] and talked with [place] regarding insurance information. Updated insurance information given and they will wait on patient to call to refill his medication.

## 2020-09-29 NOTE — Telephone Encounter (Signed)
[facility] called to get updated insurance information on patient.

## 2020-10-02 NOTE — Progress Notes (Signed)
Patient called in today, he is needing assistance applying for a fund through [facility] for his Spry[employer]. I applied online, they are requesting a copy of his insurance cards and financial information. I called patient and he will bring this information in tomorrow.

## 2020-10-05 NOTE — Progress Notes (Signed)
Called and spoke with [employer] regarding co-pay funding for patients Sprycel. They approved him while I was on the phone. Called and gave this information to [facility], they verified this information. Patient will now have a $0 co-pay, called and let patient know he can call and order medication. He will call me if he has any problems ordering.

## 2020-10-12 DIAGNOSIS — Z4889 Encounter for other specified surgical aftercare: Secondary | ICD-10-CM | POA: Diagnosis not present

## 2020-10-13 DIAGNOSIS — D519 Vitamin B12 deficiency anemia, unspecified: Secondary | ICD-10-CM | POA: Diagnosis not present

## 2020-10-23 DIAGNOSIS — M25511 Pain in right shoulder: Secondary | ICD-10-CM | POA: Diagnosis not present

## 2020-10-26 DIAGNOSIS — M25511 Pain in right shoulder: Secondary | ICD-10-CM | POA: Diagnosis not present

## 2020-11-01 DIAGNOSIS — M25511 Pain in right shoulder: Secondary | ICD-10-CM | POA: Diagnosis not present

## 2020-11-02 DIAGNOSIS — H00014 Hordeolum externum left upper eyelid: Secondary | ICD-10-CM | POA: Diagnosis not present

## 2020-11-02 DIAGNOSIS — Z6834 Body mass index (BMI) 34.0-34.9, adult: Secondary | ICD-10-CM | POA: Diagnosis not present

## 2020-11-03 DIAGNOSIS — M25511 Pain in right shoulder: Secondary | ICD-10-CM | POA: Diagnosis not present

## 2020-11-03 NOTE — Progress Notes (Signed)
[facility]  [address] [place],  [place]  [address] [phone]  Clinic Day:  11/07/2020  Referring physician: Philmore Pali, NP  This document serves as a record of services personally performed by Hosie Poisson, MD. It was created on their behalf by Curry,Lauren E, a trained medical scribe. The creation of this record is based on the scribe's personal observations and the provider's statements to them.  CHIEF COMPLAINT:  CC: Chronic myelogenous leukemia  Current Treatment:  Dasatinib100 mg daily   HISTORY OF PRESENT ILLNESS:  Kyle Paulsen Sr. is a 70 y.o. male with chronic myelogenous leukemia diagnosed in October 2015 when he presented with leukocytosis.  He was originally placed on nilotinib, but developed a severe pruritic rash, so was switched to dasatinib 100 mg daily.  He achieved a major molecular remission by May 2016.   He also has a history of a stage IA cholangiocarcinoma, treated with surgical resection in June 2014.   He then underwent surgical resection of a stage IA (T1 N0 M0) non-small cell lung cancer in February 2016.  Pathology revealed a 3 cm, well-differentiated, adenocarcinoma with negative nodes.  Adjuvant chemotherapy for his lung cancer was not recommended.  He has chronic back pain and continues seeing spine specialist.  He apparently has significant degenerative disc disease.  He has also had upper abdominal pain, which he attributes to dasatinib.  He uses hydrocodone/APAP as needed for his pain.  In May 2018, PCR was positive at 0.012%, consistent with residual CML.  Repeat PCR in August 2018 revealed a major molecular response.  His CML has remained in remission since that time.  CT chest, abdomen and pelvis in November 2019 did not reveal evidence of recurrence of either malignancy.    CT chest in August 2020 did not reveal any evidence of recurrent lung malignancy.  He hashad hypocalcemia, so was instructed to  take calcium 600 mg twice daily.  When he was seen in November 2020, had been having lower leg pain worsening throughout the day.  He underwent arterial ultrasound/ankle brachial indices, which was normal.  The pain was then felt to possibly be degenerative in nature.  He reported constipation due to the calcium and as his on calcium was normal at that time,  we decreased the calcium to daily.  He had worsening neutropenia and was found to have B12 and placed on B12 injections weekly for 4 weeks, then monthly.  Serum folate was normal.   He had persistent abdominal pain, so was referred to Dr. Lyndel Safe.  He was seen by Dr. Lyndel Safe earlier this year and had 2 gastric ulcers and H. Pylori infection, which was treated.  He saw Dr. Nila Nephew earlier in 2021 as well, and PSA was normal. When he was seen in August, he had recurrent hypocalcemia, with a calcium of 8.3.  He could not tolerate the calcium supplement, so we recommended that he take Tums 4 times daily.  He continued to complain of intermittent abdominal pain, which we have attributed to adhesions.  INTERVAL HISTORY:  Kyle Montoya is here for routine follow up and states that he has been doing well other than an injury of the right shoulder where her tore some tendons.  This required rotator cuff surgery at the beginning of the year and he has a sling in place.  He continues dasatinib 100 mg daily without difficulty.  He also continues oral calcium daily in the form of Tums or multivitamin.  Blood counts and chemistries are unremarkable except for a sodium of 133.  His  appetite is good, and he has gained 3 and 1/2 pounds since his last visit.  He denies fever, chills or other signs of infection.  He denies nausea, vomiting, bowel issues, or abdominal pain.  He denies sore throat, cough, dyspnea, or chest pain.  REVIEW OF SYSTEMS:  Review of Systems  Constitutional: Negative.   HENT:  Negative.   Eyes: Negative.   Respiratory: Negative.   Cardiovascular:  Negative.   Gastrointestinal: Negative.   Endocrine: Negative.   Genitourinary: Negative.    Musculoskeletal: Positive for arthralgias.       Recent right shoulder surgery with sling in place  Skin: Negative.   Neurological: Negative.   Hematological: Negative.   Psychiatric/Behavioral: Negative.   All other systems reviewed and are negative.    VITALS:  Blood pressure 137/83, pulse 81, temperature 98.2 F (36.8 C), temperature source Oral, resp. rate 18, height 5\' 10"  (1.778 m), weight 223 lb 6.4 oz (101.3 kg), SpO2 97 %.  Wt Readings from Last 3 Encounters:  11/07/20 223 lb 6.4 oz (101.3 kg)  09/27/20 220 lb (99.8 kg)  09/25/20 220 lb (99.8 kg)    Body mass index is 32.05 kg/m.  Performance status (ECOG): 1 - Symptomatic but completely ambulatory  PHYSICAL EXAM:  Physical Exam Constitutional:      General: He is not in acute distress.    Appearance: Normal appearance. He is normal weight.  HENT:     Head: Normocephalic and atraumatic.  Eyes:     General: No scleral icterus.    Extraocular Movements: Extraocular movements intact.     Conjunctiva/sclera: Conjunctivae normal.     Pupils: Pupils are equal, round, and reactive to light.  Cardiovascular:     Rate and Rhythm: Normal rate and regular rhythm.     Pulses: Normal pulses.     Heart sounds: Normal heart sounds. No murmur heard. No friction rub. No gallop.   Pulmonary:     Effort: Pulmonary effort is normal. No respiratory distress.     Breath sounds: Normal breath sounds.  Abdominal:     General: Bowel sounds are normal. There is no distension.     Palpations: Abdomen is soft. There is no mass.     Tenderness: There is no abdominal tenderness.  Musculoskeletal:        General: Normal range of motion.     Cervical back: Normal range of motion and neck supple.     Right lower leg: No edema.     Left lower leg: No edema.  Lymphadenopathy:     Cervical: No cervical adenopathy.  Skin:    General: Skin is  warm and dry.  Neurological:     General: No focal deficit present.     Mental Status: He is alert and oriented to person, place, and time. Mental status is at baseline.  Psychiatric:        Mood and Affect: Mood normal.        Behavior: Behavior normal.        Thought Content: Thought content normal.        Judgment: Judgment normal.     LABS:   CBC Latest Ref Rng & Units 11/07/2020 09/25/2020 08/07/2020  WBC - 10.5 4.2 7.8  Hemoglobin 13.5 - 17.5 14.4 13.0 14.0  Hematocrit 41 - 53 41 36.1(L) 40.8  Platelets 150 - 399 249 197 232   CMP Latest Ref  Rng & Units 11/07/2020 09/25/2020 08/11/2020  Glucose 70 - 99 mg/dL - 105(H) -  BUN 4 - 21 12 10  -  Creatinine 0.6 - 1.3 0.7 0.68 -  Sodium 137 - 147 133(A) 135 -  Potassium 3.4 - 5.3 3.9 3.5 -  Chloride 99 - 108 102 103 -  CO2 13 - 22 23(A) 24 -  Calcium 8.7 - 10.7 8.4(A) 8.0(L) 8.1(L)  Total Protein 6.5 - 8.1 g/dL - - -  Total Bilirubin 0.3 - 1.2 mg/dL - - -  Alkaline Phos 25 - 125 65 - -  AST 14 - 40 33 - -  ALT 10 - 40 28 - -    STUDIES:  No results found.   Allergies:  Allergies  Allergen Reactions  . Pneumococcal Vaccines Swelling    Current Medications: Current Outpatient Medications  Medication Sig Dispense Refill  . albuterol (PROVENTIL HFA;VENTOLIN HFA) 108 (90 BASE) MCG/ACT inhaler Inhale 2 puffs into the lungs every 6 (six) hours as needed for wheezing or shortness of breath.    . allopurinol (ZYLOPRIM) 300 MG tablet Take 300 mg by mouth daily.    . Aspirin 81 MG CAPS SMARTSIG:1 Tablet(s) By Mouth Daily    . aspirin EC 81 MG tablet Take 1 tablet (81 mg total) by mouth daily. 14 tablet 1  . calcium carbonate (TUMS - DOSED IN MG ELEMENTAL CALCIUM) 500 MG chewable tablet Chew 1 tablet by mouth daily as needed (heartburn).    . cephALEXin (KEFLEX) 500 MG capsule Take 1 capsule (500 mg total) by mouth 4 (four) times daily. Begin at 6pm tonight 4 capsule 1  . Cholecalciferol (VITAMIN D) 50 MCG (2000 UT) tablet Take  2,000 Units by mouth daily.    . colchicine 0.6 MG tablet colchicine 0.6 mg tablet  Take 1 tablet every day by oral route as directed for 3 days.    . cyclobenzaprine (FLEXERIL) 5 MG tablet Take 5 mg by mouth at bedtime as needed.    . dasatinib (SPRYCEL) 100 MG tablet Take 100 mg by mouth daily.    . diclofenac Sodium (VOLTAREN) 1 % GEL Apply topically.    . docusate sodium (COLACE) 100 MG capsule Take 1 capsule (100 mg total) by mouth 2 (two) times daily as needed for mild constipation. 30 capsule 1  . doxycycline (VIBRAMYCIN) 100 MG capsule Take 100 mg by mouth 2 (two) times daily.    . fluticasone (FLONASE) 50 MCG/ACT nasal spray fluticasone propionate 50 mcg/actuation nasal spray,suspension  INHALE 1 SPRAY DAILY IN EACH NOSTRIL FOR RHINITIS    . HYDROcodone-acetaminophen (NORCO) 7.5-325 MG tablet Take 1 tablet by mouth 3 (three) times daily.    Marland Kitchen losartan-hydrochlorothiazide (HYZAAR) 50-12.5 MG tablet Take 2 tablets by mouth daily.    . methocarbamol (ROBAXIN) 500 MG tablet Take 500 mg by mouth 2 (two) times daily as needed for muscle spasms.    . pantoprazole (PROTONIX) 40 MG tablet Take 1 tablet (40 mg total) by mouth daily. (Patient not taking: No sig reported) 30 tablet 11  . polyethylene glycol (MIRALAX / GLYCOLAX) 17 g packet Take 17 g by mouth daily. 14 each 0  . tamsulosin (FLOMAX) 0.4 MG CAPS capsule Take 0.4 mg by mouth daily.     Marland Kitchen umeclidinium-vilanterol (ANORO ELLIPTA) 62.5-25 MCG/INH AEPB Inhale 1 puff into the lungs daily.     Current Facility-Administered Medications  Medication Dose Route Frequency Provider Last Rate Last Admin  . ceFAZolin (ANCEF) IVPB 2g/100 mL premix  2 g Intravenous Once Susa Day, MD         ASSESSMENT & PLAN:   Assessment:   1. 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  He had a mild relapse in May 2018, but by that August, the testing returned to complete molecular remission and he has remained in complete molecular remission since that  time. PCR for BCR/ABL is pending from today. He knows to continue dasatinib daily.   2. History of stage IA non-small cell lung cancer in 2016 treated with surgery alone.  Imaging in August 2021 not reveal any evidence of recurrence.   3. History of stage IA cholangiocarcinoma in 2014 treated with surgery alone.  He has stable intermittent abdominal pain, which may be related to dasatinib, but we suspect this could represent adhesions from surgery.    4. Rare tobacco use, but I once again advised him on the importance of complete abstinence from tobacco.   5. Mild hypocalcemia, resolved with Tums 4 times daily.   6. B12 deficiency.  He remains on B12 injections monthly with his primary care office.  7. History of gastric ulcers with H pylori infection.  This was treated appropriately by Dr. Lyndel Safe.  He does not have symptoms of recurrence.    8.  Recent right rotator cuff shoulder surgery.  He is healing well and has a sling in place.    Plan: We will plan to see him back in 3 months with a CBC, comprehensive metabolic panel and PCR for BCR/ABL.  In 6 months we will repeat CT imaging.  The patient understands the plans discussed today and is in agreement with them.  He knows to contact our office if he develops concerns prior to his next appointment.    I provided 15 minutes of face-to-face time during this this encounter and > 50% was spent counseling as documented under my assessment and plan.    [name], MD [facility] AT [facility] [address] [place] [address] Dept: [phone] Dept Fax: [phone]   I, Rita Ohara, am acting as scribe for [name], MD  I have reviewed this report as typed by the medical scribe, and it is complete and accurate.  Hermina Barters

## 2020-11-07 ENCOUNTER — Inpatient Hospital Stay: Payer: Medicare Other

## 2020-11-07 ENCOUNTER — Other Ambulatory Visit: Payer: Self-pay

## 2020-11-07 ENCOUNTER — Other Ambulatory Visit: Payer: Self-pay | Admitting: Hematology and Oncology

## 2020-11-07 ENCOUNTER — Telehealth: Payer: Self-pay | Admitting: Oncology

## 2020-11-07 ENCOUNTER — Encounter: Payer: Self-pay | Admitting: Oncology

## 2020-11-07 ENCOUNTER — Inpatient Hospital Stay: Payer: Medicare Other | Attending: Hematology and Oncology | Admitting: Oncology

## 2020-11-07 ENCOUNTER — Other Ambulatory Visit: Payer: Self-pay | Admitting: Oncology

## 2020-11-07 VITALS — BP 137/83 | HR 81 | Temp 98.2°F | Resp 18 | Ht 70.0 in | Wt 223.4 lb

## 2020-11-07 DIAGNOSIS — C9210 Chronic myeloid leukemia, BCR/ABL-positive, not having achieved remission: Secondary | ICD-10-CM

## 2020-11-07 DIAGNOSIS — C249 Malignant neoplasm of biliary tract, unspecified: Secondary | ICD-10-CM | POA: Diagnosis not present

## 2020-11-07 DIAGNOSIS — C3490 Malignant neoplasm of unspecified part of unspecified bronchus or lung: Secondary | ICD-10-CM

## 2020-11-07 LAB — CBC AND DIFFERENTIAL
HCT: 41 (ref 41–53)
Hemoglobin: 14.4 (ref 13.5–17.5)
Neutrophils Absolute: 5.36
Platelets: 249 (ref 150–399)
WBC: 10.5

## 2020-11-07 LAB — BASIC METABOLIC PANEL
BUN: 12 (ref 4–21)
CO2: 23 — AB (ref 13–22)
Chloride: 102 (ref 99–108)
Creatinine: 0.7 (ref 0.6–1.3)
Glucose: 104
Potassium: 3.9 (ref 3.4–5.3)
Sodium: 133 — AB (ref 137–147)

## 2020-11-07 LAB — HEPATIC FUNCTION PANEL
ALT: 28 (ref 10–40)
AST: 33 (ref 14–40)
Alkaline Phosphatase: 65 (ref 25–125)
Bilirubin, Total: 0.8

## 2020-11-07 LAB — COMPREHENSIVE METABOLIC PANEL
Albumin: 4.1 (ref 3.5–5.0)
Calcium: 8.4 — AB (ref 8.7–10.7)

## 2020-11-07 LAB — CBC
MCV: 99 — AB (ref 80–94)
RBC: 4.16 (ref 3.87–5.11)

## 2020-11-07 NOTE — Progress Notes (Unsigned)
Prior authorization is required for CPT 81206/81206 from pt's BCBS plan. Submitted PA request and faxed clinicals to [facility] [phone]; pending review

## 2020-11-07 NOTE — Telephone Encounter (Signed)
Per 2/15 los next appt sched and given to patient

## 2020-11-08 DIAGNOSIS — M25511 Pain in right shoulder: Secondary | ICD-10-CM | POA: Diagnosis not present

## 2020-11-10 ENCOUNTER — Encounter: Payer: Self-pay | Admitting: Oncology

## 2020-11-10 DIAGNOSIS — M25511 Pain in right shoulder: Secondary | ICD-10-CM | POA: Diagnosis not present

## 2020-11-10 NOTE — Progress Notes (Unsigned)
Pt's BCBS requires authorization for BCR/ABL PCR, CPT CODES 81206/11/81207   Auth's are only good for approx 30 days; must get new authorization every time pt comes.  Takes at least 3 days

## 2020-11-14 DIAGNOSIS — D519 Vitamin B12 deficiency anemia, unspecified: Secondary | ICD-10-CM | POA: Diagnosis not present

## 2020-11-17 DIAGNOSIS — M25511 Pain in right shoulder: Secondary | ICD-10-CM | POA: Diagnosis not present

## 2020-12-06 DIAGNOSIS — G2581 Restless legs syndrome: Secondary | ICD-10-CM | POA: Diagnosis not present

## 2020-12-06 DIAGNOSIS — J301 Allergic rhinitis due to pollen: Secondary | ICD-10-CM | POA: Diagnosis not present

## 2020-12-06 DIAGNOSIS — J449 Chronic obstructive pulmonary disease, unspecified: Secondary | ICD-10-CM | POA: Diagnosis not present

## 2020-12-12 DIAGNOSIS — D519 Vitamin B12 deficiency anemia, unspecified: Secondary | ICD-10-CM | POA: Diagnosis not present

## 2020-12-14 DIAGNOSIS — Z6833 Body mass index (BMI) 33.0-33.9, adult: Secondary | ICD-10-CM | POA: Diagnosis not present

## 2020-12-14 DIAGNOSIS — M25561 Pain in right knee: Secondary | ICD-10-CM | POA: Diagnosis not present

## 2020-12-18 ENCOUNTER — Other Ambulatory Visit: Payer: Self-pay | Admitting: Oncology

## 2020-12-20 DIAGNOSIS — J301 Allergic rhinitis due to pollen: Secondary | ICD-10-CM | POA: Diagnosis not present

## 2020-12-20 DIAGNOSIS — G2581 Restless legs syndrome: Secondary | ICD-10-CM | POA: Diagnosis not present

## 2020-12-20 DIAGNOSIS — J449 Chronic obstructive pulmonary disease, unspecified: Secondary | ICD-10-CM | POA: Diagnosis not present

## 2021-01-10 ENCOUNTER — Ambulatory Visit (AMBULATORY_SURGERY_CENTER): Payer: Medicare Other

## 2021-01-10 ENCOUNTER — Other Ambulatory Visit: Payer: Self-pay

## 2021-01-10 VITALS — Ht 70.0 in | Wt 217.0 lb

## 2021-01-10 DIAGNOSIS — Z1211 Encounter for screening for malignant neoplasm of colon: Secondary | ICD-10-CM

## 2021-01-10 DIAGNOSIS — Z8371 Family history of colonic polyps: Secondary | ICD-10-CM

## 2021-01-10 MED ORDER — NA SULFATE-K SULFATE-MG SULF 17.5-3.13-1.6 GM/177ML PO SOLN: 1.0000 | Freq: Once | ORAL | 0 refills | Status: AC

## 2021-01-10 NOTE — Progress Notes (Signed)
Pre visit completed via phone call; Patient verified name, DOB, and address;  No egg or soy allergy known to patient  No issues with past sedation with any surgeries or procedures Patient denies ever being told they had issues or difficulty with intubation  No FH of Malignant Hyperthermia No diet pills per patient No home 02 use per patient  No blood thinners per patient  Pt reports issues with constipation --will have 2 day prep  No A fib or A flutter  COVID 19 guidelines implemented in PV today with Pt and RN  Coupon given to pt in PV today, Code to Pharmacy and NO PA's for preps discussed with pt In PV today  Discussed with pt there will be an out-of-pocket cost for prep and that varies from $0 to 70 dollars  Due to the COVID-19 pandemic we are asking patients to follow certain guidelines.  Pt aware of COVID protocols and [facility] guidelines

## 2021-01-15 DIAGNOSIS — E669 Obesity, unspecified: Secondary | ICD-10-CM | POA: Diagnosis not present

## 2021-01-15 DIAGNOSIS — Z0000 Encounter for general adult medical examination without abnormal findings: Secondary | ICD-10-CM | POA: Diagnosis not present

## 2021-01-15 DIAGNOSIS — Z1331 Encounter for screening for depression: Secondary | ICD-10-CM | POA: Diagnosis not present

## 2021-01-15 DIAGNOSIS — Z1211 Encounter for screening for malignant neoplasm of colon: Secondary | ICD-10-CM | POA: Diagnosis not present

## 2021-01-15 DIAGNOSIS — Z9181 History of falling: Secondary | ICD-10-CM | POA: Diagnosis not present

## 2021-01-18 DIAGNOSIS — Z79899 Other long term (current) drug therapy: Secondary | ICD-10-CM | POA: Diagnosis not present

## 2021-01-18 DIAGNOSIS — C9210 Chronic myeloid leukemia, BCR/ABL-positive, not having achieved remission: Secondary | ICD-10-CM | POA: Diagnosis not present

## 2021-01-18 DIAGNOSIS — M109 Gout, unspecified: Secondary | ICD-10-CM | POA: Diagnosis not present

## 2021-01-18 DIAGNOSIS — D519 Vitamin B12 deficiency anemia, unspecified: Secondary | ICD-10-CM | POA: Diagnosis not present

## 2021-01-18 DIAGNOSIS — I10 Essential (primary) hypertension: Secondary | ICD-10-CM | POA: Diagnosis not present

## 2021-01-25 ENCOUNTER — Ambulatory Visit (AMBULATORY_SURGERY_CENTER): Payer: BC Managed Care – PPO | Admitting: Gastroenterology

## 2021-01-25 ENCOUNTER — Encounter: Payer: Self-pay | Admitting: Gastroenterology

## 2021-01-25 VITALS — BP 147/80 | HR 56 | Temp 98.9°F | Resp 12 | Ht 70.0 in | Wt 217.0 lb

## 2021-01-25 DIAGNOSIS — Z1211 Encounter for screening for malignant neoplasm of colon: Secondary | ICD-10-CM

## 2021-01-25 DIAGNOSIS — D122 Benign neoplasm of ascending colon: Secondary | ICD-10-CM

## 2021-01-25 DIAGNOSIS — D125 Benign neoplasm of sigmoid colon: Secondary | ICD-10-CM | POA: Diagnosis not present

## 2021-01-25 DIAGNOSIS — D120 Benign neoplasm of cecum: Secondary | ICD-10-CM | POA: Diagnosis not present

## 2021-01-25 DIAGNOSIS — Z86010 Personal history of colonic polyps: Secondary | ICD-10-CM | POA: Diagnosis not present

## 2021-01-25 DIAGNOSIS — Z8371 Family history of colonic polyps: Secondary | ICD-10-CM

## 2021-01-25 DIAGNOSIS — Z83719 Family history of colon polyps, unspecified: Secondary | ICD-10-CM

## 2021-01-25 DIAGNOSIS — D123 Benign neoplasm of transverse colon: Secondary | ICD-10-CM

## 2021-01-25 DIAGNOSIS — D124 Benign neoplasm of descending colon: Secondary | ICD-10-CM

## 2021-01-25 MED ORDER — SODIUM CHLORIDE 0.9 % IV SOLN: 500.0000 mL | Freq: Once | INTRAVENOUS | Status: DC

## 2021-01-25 NOTE — Progress Notes (Signed)
Report given to PACU, vss 

## 2021-01-25 NOTE — Progress Notes (Signed)
I called the patient's wife Kyle Montoya  patient is doing well.  No bleeding or  abdominal pain.   I did tell her that some bleeding would be expected.  If it is significant, she needs to get in touch with [place].

## 2021-01-25 NOTE — Patient Instructions (Signed)
Handouts given:  Polyps, Diverticulosis, Hemorrhoids Resume previous diet Continue current medications Await pathology results DO NOT TAKE :  Aspirin, ibuprofen, naproxen or any other NSAID's for 7 days Repeat in 6 months with a 2 day prep  YOU HAD AN ENDOSCOPIC PROCEDURE TODAY AT [facility]:   Refer to the procedure report that was given to you for any specific questions about what was found during the examination.  If the procedure report does not answer your questions, please call your gastroenterologist to clarify.  If you requested that your care partner not be given the details of your procedure findings, then the procedure report has been included in a sealed envelope for you to review at your convenience later.  YOU SHOULD EXPECT: Some feelings of bloating in the abdomen. Passage of more gas than usual.  Walking can help get rid of the air that was put into your GI tract during the procedure and reduce the bloating. If you had a lower endoscopy (such as a colonoscopy or flexible sigmoidoscopy) you may notice spotting of blood in your stool or on the toilet paper. If you underwent a bowel prep for your procedure, you may not have a normal bowel movement for a few days.  Please Note:  You might notice some irritation and congestion in your nose or some drainage.  This is from the oxygen used during your procedure.  There is no need for concern and it should clear up in a day or so.  SYMPTOMS TO REPORT IMMEDIATELY:   Following lower endoscopy (colonoscopy or flexible sigmoidoscopy):  Excessive amounts of blood in the stool  Significant tenderness or worsening of abdominal pains  Swelling of the abdomen that is new, acute  Fever of 100F or higher  For urgent or emergent issues, a gastroenterologist can be reached at any hour by calling [phone]. Do not use MyChart messaging for urgent concerns.   DIET:  We do recommend a small meal at first, but then you may  proceed to your regular diet.  Drink plenty of fluids but you should avoid alcoholic beverages for 24 hours.  ACTIVITY:  You should plan to take it easy for the rest of today and you should NOT DRIVE or use heavy machinery until tomorrow (because of the sedation medicines used during the test).    FOLLOW UP: Our staff will call the number listed on your records 48-72 hours following your procedure to check on you and address any questions or concerns that you may have regarding the information given to you following your procedure. If we do not reach you, we will leave a message.  We will attempt to reach you two times.  During this call, we will ask if you have developed any symptoms of COVID 19. If you develop any symptoms (ie: fever, flu-like symptoms, shortness of breath, cough etc.) before then, please call [phone].  If you test positive for Covid 19 in the 2 weeks post procedure, please call and report this information to [place].    If any biopsies were taken you will be contacted by phone or by letter within the next 1-3 weeks.  Please call us at [phone] if you have not heard about the biopsies in 3 weeks.   SIGNATURES/CONFIDENTIALITY: You and/or your care partner have signed paperwork which will be entered into your electronic medical record.  These signatures attest to the fact that that the information above on your After Visit Summary has been reviewed  and is understood.  Full responsibility of the confidentiality of this discharge information lies with you and/or your care-partner.

## 2021-01-25 NOTE — Progress Notes (Signed)
Medical history reviewed with no changes noted since PV. VS assessed by C.W

## 2021-01-25 NOTE — Op Note (Signed)
[facility] Patient Name: Kyle Montoya Procedure Date: 01/25/2021 7:20 AM MRN: [id number] Endoscopist: [name] , MD Age: 70 Referring MD:  Date of Birth: Jun 22, 1951 Gender: Male Account #: [id number] Procedure:                Colonoscopy Indications:              Colon cancer screening in patient at increased                            risk: Family history of 1st-degree relative with                            colon polyps at age 80 years (or older) Medicines:                Monitored Anesthesia Care Procedure:                Pre-Anesthesia Assessment:                           - Prior to the procedure, a History and Physical                            was performed, and patient medications and                            allergies were reviewed. The patient's tolerance of                            previous anesthesia was also reviewed. The risks                            and benefits of the procedure and the sedation                            options and risks were discussed with the patient.                            All questions were answered, and informed consent                            was obtained. Prior Anticoagulants: The patient has                            taken no previous anticoagulant or antiplatelet                            agents. ASA Grade Assessment: III - A patient with                            severe systemic disease. After reviewing the risks                            and benefits, the patient was deemed in  satisfactory condition to undergo the procedure.                           After obtaining informed consent, the colonoscope                            was passed under direct vision. Throughout the                            procedure, the patient's blood pressure, pulse, and                            oxygen saturations were monitored continuously. The                            Olympus CF-HQ190L  [id number]) Colonoscope was                            introduced through the anus and advanced to the the                            cecum, identified by appendiceal orifice and                            ileocecal valve. The colonoscopy was somewhat                            difficult. The patient tolerated the procedure                            well. The quality of the bowel preparation was                            good. The ileocecal valve, appendiceal orifice, and                            rectum were photographed. Scope In: 8:07:21 AM Scope Out: 9:05:32 AM Scope Withdrawal Time: 0 hours 49 minutes 25 seconds  Total Procedure Duration: 0 hours 58 minutes 11 seconds  Findings:                 A 12 mm polyp was found in the cecum. The polyp was                            sessile. The polyp was removed with a cold snare                            using a piecemeal technique. Resection and                            retrieval were complete.                           Multiple (12) sessile polyps were found in the  ascending colon. The polyps were 6 to 12 mm in                            size. Smaller polyps were removed using cold snare                            and larger polyps were removed with a hot snare.                            Resection and retrieval were complete.                           6 sessile polyps were found in the mid transverse                            colon. The polyps were 6 to 8 mm in size. These                            polyps were removed with a hot snare. Resection and                            retrieval were complete.                           A 20 mm polyp was found in the proximal descending                            colon. The polyp was sessile, examined by NBI.                            Biopsies were taken with a cold forceps for                            histology. Area was tattooed with an injection of 1                             mL of [place] ink.                           3 sessile polyps were found in the mid descending                            colon. The polyps were 4 to 6 mm in size. These                            polyps were removed with a cold snare. Resection                            and retrieval were complete.                           A 10 mm polyp was found in the mid  sigmoid colon.                            The polyp was sessile. The polyp was removed with a                            hot snare. Resection and retrieval were complete.                           Note that small polyps still remain.                           A few small-mouthed diverticula were found in the                            sigmoid colon and ascending colon.                           Non-bleeding internal hemorrhoids were found during                            retroflexion. The hemorrhoids were small.                           The exam was otherwise without abnormality on                            direct and retroflexion views. Complications:            No immediate complications. Estimated Blood Loss:     Estimated blood loss: none. Impression:               -Multiple colonic polyps (22) s/p polypectomy.                           -One 20 mm flat sessile polyp in the proximal                            descending colon. Biopsied. Tattooed.                           -Mild pancolonic diverticulosis                           -Non-bleeding internal hemorrhoids.                           -The examination was otherwise normal on direct and                            retroflexion views. Recommendation:           - Patient has a contact number available for                            emergencies. The signs and symptoms of potential  delayed complications were discussed with the                            patient. Return to normal activities tomorrow.                            Written  discharge instructions were provided to the                            patient. Patient is at a high risk for post                            polypectomy complications including bleeding due to                            multiplicity of colonic polyps.                           - Resume previous diet.                           - Continue present medications.                           - Await pathology results.                           - No aspirin, ibuprofen, naproxen, or other                            non-steroidal anti-inflammatory drugs for 7 days                            after polyp removal.                           - Repeat colonoscopy in 6 months with 2-day                            preparation for retreatment. We will ask our                            advanced endoscopist Dr. Rush Landmark to perform the                            procedure.                           - Return to GI clinic in 12 weeks.                           - The findings and recommendations were discussed                            with the patient's family. [name], MD 01/25/2021 9:21:17 AM This report has been signed electronically.

## 2021-01-25 NOTE — Progress Notes (Signed)
Called to room to assist during endoscopic procedure.  Patient ID and intended procedure confirmed with present staff. Received instructions for my participation in the procedure from the performing physician.  

## 2021-01-29 ENCOUNTER — Telehealth: Payer: Self-pay | Admitting: *Deleted

## 2021-01-29 NOTE — Telephone Encounter (Signed)
1. Have you developed a fever since your procedure? no  2.   Have you had an respiratory symptoms (SOB or cough) since your procedure? no  3.   Have you tested positive for COVID 19 since your procedure no 4.   Have you had any family members/close contacts diagnosed with the COVID 19 since your procedure?  no   If yes to any of these questions please route to Joylene John, RN and Joella Prince, RN Follow up Call-  Call back number 01/25/2021 10/21/2019  Post procedure Call Back phone  # [phone]  Permission to leave phone message Yes Yes  Some recent data might be hidden     Patient questions:  Do you have a fever, pain , or abdominal swelling? No. Pain Score  0 *  Have you tolerated food without any problems? Yes.    Have you been able to return to your normal activities? Yes.    Do you have any questions about your discharge instructions: Diet   No. Medications  No. Follow up visit  No.  Do you have questions or concerns about your Care? No.  Actions: * If pain score is 4 or above: No action needed, pain <4.

## 2021-01-30 DIAGNOSIS — M25561 Pain in right knee: Secondary | ICD-10-CM | POA: Diagnosis not present

## 2021-02-05 ENCOUNTER — Inpatient Hospital Stay: Payer: BC Managed Care – PPO | Admitting: Hematology and Oncology

## 2021-02-05 ENCOUNTER — Inpatient Hospital Stay: Payer: BC Managed Care – PPO

## 2021-02-05 ENCOUNTER — Other Ambulatory Visit: Payer: Self-pay

## 2021-02-07 NOTE — Progress Notes (Signed)
I have discussed Bx results with pt over the phone. All polyps were tubular adenomas. Bx from large flat descending colon polyp- TA No need for letter.   Gabe,  Can I ask you to review colon report and determine if you could do EMR (nonurgent) He had a flat 20 mm polyp in descending colon (Bx-tubular adenoma), has been tattooed. Patient is aware He would need 2-day prep Thanks, as always, for your help  RG

## 2021-02-12 ENCOUNTER — Other Ambulatory Visit: Payer: Self-pay | Admitting: Hematology and Oncology

## 2021-02-12 ENCOUNTER — Encounter: Payer: Self-pay | Admitting: Hematology and Oncology

## 2021-02-12 ENCOUNTER — Inpatient Hospital Stay: Payer: BC Managed Care – PPO | Attending: Hematology and Oncology

## 2021-02-12 ENCOUNTER — Other Ambulatory Visit: Payer: Self-pay

## 2021-02-12 DIAGNOSIS — C9210 Chronic myeloid leukemia, BCR/ABL-positive, not having achieved remission: Secondary | ICD-10-CM | POA: Diagnosis not present

## 2021-02-12 DIAGNOSIS — C3490 Malignant neoplasm of unspecified part of unspecified bronchus or lung: Secondary | ICD-10-CM | POA: Diagnosis not present

## 2021-02-12 LAB — CBC: RBC: 4.05 (ref 3.87–5.11)

## 2021-02-12 LAB — BASIC METABOLIC PANEL
BUN: 14 (ref 4–21)
CO2: 26 — AB (ref 13–22)
Chloride: 103 (ref 99–108)
Creatinine: 0.6 (ref 0.6–1.3)
Glucose: 120
Potassium: 3.7 (ref 3.4–5.3)
Sodium: 135 — AB (ref 137–147)

## 2021-02-12 LAB — HEPATIC FUNCTION PANEL
ALT: 24 (ref 10–40)
AST: 33 (ref 14–40)
Alkaline Phosphatase: 56 (ref 25–125)
Bilirubin, Total: 0.8

## 2021-02-12 LAB — CBC AND DIFFERENTIAL
HCT: 39 — AB (ref 41–53)
Hemoglobin: 13.5 (ref 13.5–17.5)
Neutrophils Absolute: 2.88
Platelets: 202 (ref 150–399)
WBC: 6

## 2021-02-12 LAB — COMPREHENSIVE METABOLIC PANEL
Albumin: 3.7 (ref 3.5–5.0)
Calcium: 8.2 — AB (ref 8.7–10.7)

## 2021-02-16 NOTE — Progress Notes (Deleted)
[facility]  [address] [place],  [place]  [address] [phone]  Clinic Day:  02/16/2021  Referring physician: Philmore Pali, NP   CHIEF COMPLAINT:  CC:  Chronic myelogenous leukemia  Current Treatment:   Dasatinib 100 mg daily   HISTORY OF PRESENT ILLNESS:  Kyle Slinger Sr. is a 70 y.o. male with chronic myelogenous leukemia diagnosed in October 2015 when he presented with leukocytosis. He was initially placed on nilotinib, but developed a severe pruritic rash, so was switched to dasatinib 100 mg daily. He achieved a major molecular remission by May 2016. He also has a history of a stage IA cholangiocarcinoma, treated with surgical resection in June 2014. He then underwent surgical resection of a stage IA (T1 N0 M0) non-small cell lung cancer in February 2016. Pathology revealed a 3 cm, well-differentiated, adenocarcinoma with negative nodes. Adjuvant chemotherapy for his lung cancer was not recommended. He has chronic back pain and continues seeing spine specialist. He apparently has significant degenerative disc disease. He has also had upper abdominal pain, which he attributes to dasatinib, but we have felt most likely secondary to adhesions from surgery. He uses hydrocodone/APAP as needed for his pain. In May 2018, PCR was positive at 0.012%, consistent with recurrent CML. Repeat PCR in August 2018 revealed a major molecular response. His CML has remained in remission since that time. CT chest, abdomen and pelvis in November 2019 did not reveal evidence of recurrence of either malignancy.   CT chest in August 2020 did not reveal any evidence of recurrent lung malignancy. He hashad hypocalcemia, so was instructed to take calcium 600 mg twice daily. When he was seen in November 2020, had been having lower leg pain worsening throughout the day. He underwent arterial ultrasound/ankle brachial indices,which was normal. The pain  was then felt to possibly be degenerative in nature. He reportedconstipation due to the calciumand ashis on calcium was normal at that time, so we decreasedthe calcium to daily. He had worsening neutropenia and was found to have B12 and placed on B12 injections weekly for 4 weeks, then monthly.  He was later transitioned to oral B12 supplementation.   He had persistent abdominal pain, so was referred to Dr. Lyndel Safe.  EGD in January 2021 revealed 2 gastric ulcers. H. Pylori was positive and was treated with triple therapy. He sawDr. Nila Nephew in 2021 and his PSA was normal.When he was seen in August, he had recurrent hypocalcemia, with a calcium of 8.3. He could not tolerate the calcium supplement, so we recommended that he take Tums 4 times daily. He has had continued to complain of intermittent abdominal pain, whichwe have attributed to adhesions. CT chest , abdomen and pelvis in August 2021 did not reveal any evidence of recurrent malignancy.  At his visit in February, he was doing well other than an injury of the right shoulder requiring rotator cuff surgery.  He continues dasatinib 100 mg daily without difficulty. He continued oral calcium daily in the form of Tums or multivitamin.   INTERVAL HISTORY:  Kyle Montoya is here today for repeat clinical assessment. He had a colonoscopy on May 5th with removal of multiple small polyps , which were all tubular adenomas. There was a flat descending colon polyp biopsy of which revealed tubular adenoma, so he has been referred to Dr. [place] Landmark. He denies fevers or chills. He denies pain. His appetite is good. His weight {Weight change:10426}.  Labs were drawn prior to his appointment today.  His calcium remained low at 8.2.  PCR for BCR/ABL is pending.  REVIEW OF SYSTEMS:  Review of Systems  Constitutional: Negative for appetite change, chills, fatigue, fever and unexpected weight change.  HENT:   Negative for lump/mass, mouth sores and sore throat.    Respiratory: Negative for cough and shortness of breath.   Cardiovascular: Negative for chest pain and leg swelling.  Gastrointestinal: Negative for abdominal pain, constipation, diarrhea, nausea and vomiting.  Genitourinary: Negative for difficulty urinating, dysuria, frequency and hematuria.   Musculoskeletal: Negative for arthralgias, back pain and myalgias.  Skin: Negative for itching, rash and wound.  Neurological: Negative for dizziness, extremity weakness, headaches, light-headedness and numbness.  Hematological: Negative for adenopathy.  Psychiatric/Behavioral: Negative for depression and sleep disturbance. The patient is not nervous/anxious.      VITALS:  There were no vitals taken for this visit.  Wt Readings from Last 3 Encounters:  01/25/21 217 lb (98.4 kg)  01/10/21 217 lb (98.4 kg)  11/07/20 223 lb 6.4 oz (101.3 kg)    There is no height or weight on file to calculate BMI.  Performance status (ECOG): {CHL ONC [id number]  PHYSICAL EXAM:  Physical Exam Vitals and nursing note reviewed.  Constitutional:      General: He is not in acute distress.    Appearance: Normal appearance. He is normal weight.  HENT:     Head: Normocephalic and atraumatic.     Mouth/Throat:     Mouth: Mucous membranes are moist.     Pharynx: Oropharynx is clear. No oropharyngeal exudate or posterior oropharyngeal erythema.  Eyes:     General: No scleral icterus.    Extraocular Movements: Extraocular movements intact.     Conjunctiva/sclera: Conjunctivae normal.     Pupils: Pupils are equal, round, and reactive to light.  Cardiovascular:     Rate and Rhythm: Normal rate and regular rhythm.     Heart sounds: Normal heart sounds. No murmur heard. No friction rub. No gallop.   Pulmonary:     Effort: Pulmonary effort is normal.     Breath sounds: Normal breath sounds. No wheezing, rhonchi or rales.  Chest:  Breasts:     Right: No axillary adenopathy or supraclavicular adenopathy.      Left: No axillary adenopathy or supraclavicular adenopathy.    Abdominal:     General: Bowel sounds are normal. There is no distension.     Palpations: Abdomen is soft. There is no hepatomegaly, splenomegaly or mass.     Tenderness: There is no abdominal tenderness.  Musculoskeletal:        General: Normal range of motion.     Cervical back: Normal range of motion and neck supple. No tenderness.     Right lower leg: No edema.     Left lower leg: No edema.  Lymphadenopathy:     Cervical: No cervical adenopathy.     Upper Body:     Right upper body: No supraclavicular or axillary adenopathy.     Left upper body: No supraclavicular or axillary adenopathy.     Lower Body: No right inguinal adenopathy. No left inguinal adenopathy.  Skin:    General: Skin is warm and dry.     Coloration: Skin is not jaundiced.     Findings: No rash.  Neurological:     Mental Status: He is alert and oriented to person, place, and time.     Cranial Nerves: No cranial nerve deficit.  Psychiatric:  Mood and Affect: Mood normal.        Behavior: Behavior normal.        Thought Content: Thought content normal.     LABS:   CBC Latest Ref Rng & Units 02/12/2021 11/07/2020 09/25/2020  WBC - 6.0 10.5 4.2  Hemoglobin 13.5 - 17.5 13.5 14.4 13.0  Hematocrit 41 - 53 39(A) 41 36.1(L)  Platelets 150 - 399 202 249 197   CMP Latest Ref Rng & Units 02/12/2021 11/07/2020 09/25/2020  Glucose 70 - 99 mg/dL - - 105(H)  BUN 4 - 21 14 12 10   Creatinine 0.6 - 1.3 0.6 0.7 0.68  Sodium 137 - 147 135(A) 133(A) 135  Potassium 3.4 - 5.3 3.7 3.9 3.5  Chloride 99 - 108 103 102 103  CO2 13 - 22 26(A) 23(A) 24  Calcium 8.7 - 10.7 8.2(A) 8.4(A) 8.0(L)  Total Protein 6.5 - 8.1 g/dL - - -  Total Bilirubin 0.3 - 1.2 mg/dL - - -  Alkaline Phos 25 - 125 56 65 -  AST 14 - 40 33 33 -  ALT 10 - 40 24 28 -     No results found for: CEA1 / No results found for: CEA1 No results found for: PSA1 No results found for: YPP509 No  results found for: TOI712  No results found for: TOTALPROTELP, ALBUMINELP, A1GS, A2GS, BETS, BETA2SER, GAMS, MSPIKE, SPEI No results found for: TIBC, FERRITIN, IRONPCTSAT No results found for: LDH  STUDIES:  No results found.    HISTORY:   Past Medical History:  Diagnosis Date  . Arthritis    on PRN meds  . Asthma    uses inhaler  . BPH (benign prostatic hyperplasia)   . CAD (coronary artery disease)   . Cataract    bilateral sx  . Cholangiocarcinoma of biliary tract ([facility])   . Chronic myelogenous leukemia ([facility])   . COPD (chronic obstructive pulmonary disease) ([facility])    uses inhaler  . DJD (degenerative joint disease)   . Dyslipidemia   . Family history of colonic polyps   . Gout   . HTN (hypertension)    on meds  . Hypercholesterolemia   . Lung cancer ([facility])   . Myelogenous leukemia ([facility])   . OA (osteoarthritis)   . PAD (peripheral artery disease) ([facility])   . Renal insufficiency   . Tendonitis    patellar    Past Surgical History:  Procedure Laterality Date  . Bowel duct surg  2014   Bowel cancer  . CARDIAC CATHETERIZATION  2000  . CHOLECYSTECTOMY    . COLONOSCOPY  06/23/2015   mild sigmoid diverticulosis. small external and internal hemorrhoids. otherwise normal colonoscopy to terminal ileum  . LUNG CANCER SURGERY  Jan. 2016   partial lobectomy  . SHOULDER OPEN ROTATOR CUFF REPAIR Right 09/27/2020   Procedure: Right shoulder mini open rotator cuff repair, distal clavicle resection;  Surgeon: Susa Day, MD;  Location: [facility]S;  Service: Orthopedics;  Laterality: Right;  90 mins  Choice with Block anesthesia  . wipple procedure     stage I- due to bile duct cancer-2014 at [facility] (Dr Vernard Gambles). Stage IA cholangiocarcinoma    Family History  Problem Relation Age of Onset  . Prostate cancer Father 71       deceased  . Hypertension Mother   . Hypertension Brother   . Hypertension Sister   . Asthma Sister   . Esophageal cancer Neg Hx   . Colon polyps Neg Hx   .  Colon cancer Neg Hx   . Stomach cancer Neg Hx   . Rectal cancer Neg Hx     Social History:  reports that he has quit smoking. His smoking use included cigarettes. He has a 10.00 pack-year smoking history. He has never used smokeless tobacco. He reports current alcohol use of about 6.0 standard drinks of alcohol per week. He reports that he does not use drugs.The patient is {Blank single:19197::"alone","accompanied by"} *** today.  Allergies:  Allergies  Allergen Reactions  . Pneumococcal Vaccines Swelling    Current Medications: Current Outpatient Medications  Medication Sig Dispense Refill  . albuterol (PROVENTIL HFA;VENTOLIN HFA) 108 (90 BASE) MCG/ACT inhaler Inhale 2 puffs into the lungs every 6 (six) hours as needed for wheezing or shortness of breath.    . allopurinol (ZYLOPRIM) 300 MG tablet Take 300 mg by mouth daily.    . calcium carbonate (TUMS - DOSED IN MG ELEMENTAL CALCIUM) 500 MG chewable tablet Chew 1 tablet by mouth daily as needed (heartburn).    . Cholecalciferol (VITAMIN D) 50 MCG (2000 UT) tablet Take 2,000 Units by mouth daily.    . cyclobenzaprine (FLEXERIL) 5 MG tablet Take 5 mg by mouth at bedtime as needed. (Patient not taking: Reported on 01/25/2021)    . diclofenac Sodium (VOLTAREN) 1 % GEL Apply topically.    Mariane Baumgarten Sodium (COLACE PO) Take by mouth daily as needed. (Patient not taking: Reported on 01/25/2021)    . fluticasone (FLONASE) 50 MCG/ACT nasal spray fluticasone propionate 50 mcg/actuation nasal spray,suspension  INHALE 1 SPRAY DAILY IN EACH NOSTRIL FOR RHINITIS    . HYDROcodone-acetaminophen (NORCO) 7.5-325 MG tablet Take 1 tablet by mouth 3 (three) times daily.    Marland Kitchen losartan-hydrochlorothiazide (HYZAAR) 50-12.5 MG tablet Take 2 tablets by mouth daily.    . methocarbamol (ROBAXIN) 500 MG tablet Take 500 mg by mouth 2 (two) times daily as needed for muscle spasms.    . polyethylene glycol (MIRALAX / GLYCOLAX) 17 g packet Take 17 g by mouth daily.  (Patient not taking: Reported on 01/25/2021) 14 each 0  . SPRYCEL 100 MG tablet TAKE 1 TABLET BY MOUTH  DAILY 30 tablet 5  . tamsulosin (FLOMAX) 0.4 MG CAPS capsule Take 0.4 mg by mouth daily.     . TRELEGY ELLIPTA 100-62.5-25 MCG/INH AEPB daily at 6 (six) AM.     No current facility-administered medications for this visit.     ASSESSMENT & PLAN:   Assessment:  Dollie Mayse Sr. is a 70 y.o. male ***  1. 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 He had a mild relapse in May 2018, but by that August, the testing returned to complete molecular remission and he has remained in complete molecular remission since that time. PCR for BCR/ABL is pending from today. We will continue dasatinib daily.   2. History of stage IA non-small cell lung cancer in 2016 treated with surgery alone. Imaging in August 2021 not reveal any evidence of recurrence.   3. History of stage IA cholangiocarcinoma in 2014 treated with surgery alone. He has stable intermittent abdominal pain, which may be related to dasatinib, but we suspect this could represent adhesions from surgery.  imaging in August 2021 did not reveal any evidence of recurrence.  4. Rare tobacco use, but I once again advised him on the importance of complete abstinence from tobacco.   5. Mild hypocalcemia, resolved with Tums4times daily.   6. B12 deficiency. He remains on B12 injections monthly with his  primary care office.  7.History of gastric ulcers with H pylori infection. This was treated appropriately by Dr. Lyndel Safe. He does not have symptoms of recurrence.  8.  Right rotator cuff shoulder surgery.     Plan:    He knows to continue dasatinib daily.  We will plan to see him back in 3 months with a CBC, comprehensive metabolic panel, PCR for BCR/ABL and CT chest, abdomen and pelvis for surveillance. ***  The patient understands the plans discussed today and is in agreement with them.  He knows to contact our office  if he develops concerns prior to his next appointment.   I provided *** minutes (4:15 PM - 4:15 PM) of face-to-face time during this this encounter and > 50% was spent counseling as documented under my assessment and plan.    Marvia Pickles, PA-C

## 2021-02-18 LAB — BCR-ABL1, CML/ALL, PCR, QUANT: Interpretation (BCRAL):: NEGATIVE

## 2021-02-20 ENCOUNTER — Encounter: Payer: Self-pay | Admitting: Gastroenterology

## 2021-02-20 ENCOUNTER — Inpatient Hospital Stay: Payer: BC Managed Care – PPO | Admitting: Hematology and Oncology

## 2021-02-20 ENCOUNTER — Ambulatory Visit (INDEPENDENT_AMBULATORY_CARE_PROVIDER_SITE_OTHER): Payer: BC Managed Care – PPO | Admitting: Gastroenterology

## 2021-02-20 ENCOUNTER — Ambulatory Visit: Payer: Medicare Other | Admitting: Gastroenterology

## 2021-02-20 VITALS — BP 140/72 | HR 70 | Ht 70.0 in | Wt 218.0 lb

## 2021-02-20 DIAGNOSIS — Z8711 Personal history of peptic ulcer disease: Secondary | ICD-10-CM

## 2021-02-20 DIAGNOSIS — D124 Benign neoplasm of descending colon: Secondary | ICD-10-CM

## 2021-02-20 DIAGNOSIS — R933 Abnormal findings on diagnostic imaging of other parts of digestive tract: Secondary | ICD-10-CM

## 2021-02-20 DIAGNOSIS — D519 Vitamin B12 deficiency anemia, unspecified: Secondary | ICD-10-CM | POA: Diagnosis not present

## 2021-02-20 DIAGNOSIS — Z86010 Personal history of colonic polyps: Secondary | ICD-10-CM

## 2021-02-20 DIAGNOSIS — C9210 Chronic myeloid leukemia, BCR/ABL-positive, not having achieved remission: Secondary | ICD-10-CM

## 2021-02-20 DIAGNOSIS — Z1211 Encounter for screening for malignant neoplasm of colon: Secondary | ICD-10-CM

## 2021-02-20 MED ORDER — PEG 3350-KCL-NA BICARB-NACL 420 G PO SOLR: 4000.0000 mL | Freq: Once | ORAL | 0 refills | Status: AC

## 2021-02-20 NOTE — Patient Instructions (Signed)
If you are age 71 or older, your body mass index should be between 23-30. Your Body mass index is 31.28 kg/m. If this is out of the aforementioned range listed, please consider follow up with your Primary Care Provider.  The [facility] providers would like to encourage you to use [facility] to communicate with providers for non-urgent requests or questions.  Due to long hold times on the telephone, sending your provider a message by [facility] may be a faster and more efficient way to get a response.  Please allow 48 business hours for a response.  Please remember that this is for non-urgent requests.   You have been scheduled for a colonoscopy. Please follow written instructions given to you at your visit today.  Please pick up your prep supplies at the pharmacy within the next 1-3 days. If you use inhalers (even only as needed), please bring them with you on the day of your procedure.  We have sent the following medications to your pharmacy for you to pick up at your convenience: Golytely   Due to recent changes in healthcare laws, you may see the results of your imaging and laboratory studies on MyChart before your provider has had a chance to review them.  We understand that in some cases there may be results that are confusing or concerning to you. Not all laboratory results come back in the same time frame and the provider may be waiting for multiple results in order to interpret others.  Please give [place] 48 hours in order for your provider to thoroughly review all the results before contacting the office for clarification of your results.   Thank you for choosing me and [facility].  Dr. [facility] Landmark

## 2021-02-21 ENCOUNTER — Ambulatory Visit: Payer: Medicare Other | Admitting: Hematology and Oncology

## 2021-02-21 NOTE — Progress Notes (Deleted)
[facility]  [address] [place],  [place]  [address] [phone]  Clinic Day:  02/21/2021  Referring physician: Philmore Pali, NP   CHIEF COMPLAINT:  CC: ***  Current Treatment:  ***   HISTORY OF PRESENT ILLNESS:  Kyle Bayona Sr. is a 70 y.o. male with a history of ***  chronic myelogenous leukemia diagnosed in October 2015 when he presented with leukocytosis. He was originally placed on nilotinib, but developed a severe pruritic rash, so was switched to dasatinib 100 mg daily. He achieved a major molecular remission by May 2016. He also has a history of a stage IA cholangiocarcinoma, treated with surgical resection in June 2014. He then underwent surgical resection of a stage IA (T1 N0 M0) non-small cell lung cancer in February 2016. Pathology revealed a 3 cm, well-differentiated, adenocarcinoma with negative nodes. Adjuvant chemotherapy for his lung cancer was not recommended. He has chronic back pain and continues seeing spine specialist. He apparently has significant degenerative disc disease. He has also had upper abdominal pain, which he attributes to dasatinib. He uses hydrocodone/APAP as needed for his pain. In May 2018, PCR was positive at 0.012%, consistent with residual CML. Repeat PCR in August 2018 revealed a major molecular response. His CML has remained in remission since that time. CT chest, abdomen and pelvis in November 2019 did not reveal evidence of recurrence of either malignancy.   CT chest in August 2020 did not reveal any evidence of recurrent lung malignancy. He hashad hypocalcemia, so was instructed to take calcium 600 mg twice daily. When he was seen in November 2020, had been having lower leg pain worsening throughout the day. He underwent arterial ultrasound/ankle brachial indices,which was normal. The pain was then felt to possibly be degenerative in nature. He reportedconstipation due to the  calciumand ashis on calcium was normal at that time, we decreasedthe calcium to daily. He had worsening neutropenia and was found to have B12 and placed on B12 injections weekly for 4 weeks, then monthly. Serum folate was normal.   He had persistent abdominal pain, so was referred to Dr. Lyndel Safe. He was seen by Dr. Lyndel Safe earlier this year and had 2 gastric ulcers and H. Pylori infection, which was treated. He sawDr. Nila Nephew earlier in 2021 as well, and PSA was normal.When he was seen in August, he had recurrent hypocalcemia, with a calcium of 8.3. He could not tolerate the calcium supplement, so we recommended that he take Tums 4 times daily. He continued to complain of intermittent abdominal pain, whichwe have attributed to adhesions.   INTERVAL HISTORY:  Kyle Montoya is here today for repeat clinical assessment. He denies fevers or chills. He denies pain. His appetite is good. His weight {Weight change:10426}.  REVIEW OF SYSTEMS:  Review of Systems  Constitutional: Negative for appetite change, chills, fatigue, fever and unexpected weight change.  HENT:   Negative for lump/mass, mouth sores and sore throat.   Respiratory: Negative for cough and shortness of breath.   Cardiovascular: Negative for chest pain and leg swelling.  Gastrointestinal: Negative for abdominal pain, constipation, diarrhea, nausea and vomiting.  Genitourinary: Negative for difficulty urinating, dysuria, frequency and hematuria.   Musculoskeletal: Negative for arthralgias, back pain and myalgias.  Skin: Negative for itching, rash and wound.  Neurological: Negative for dizziness, extremity weakness, headaches, light-headedness and numbness.  Hematological: Negative for adenopathy.  Psychiatric/Behavioral: Negative for depression and sleep disturbance. The patient is not nervous/anxious.      VITALS:  There were no vitals taken for this visit.  Wt Readings from Last 3 Encounters:  02/20/21 218 lb (98.9 kg)  01/25/21  217 lb (98.4 kg)  01/10/21 217 lb (98.4 kg)    There is no height or weight on file to calculate BMI.  Performance status (ECOG): {CHL ONC [id number]  PHYSICAL EXAM:  Physical Exam Vitals and nursing note reviewed.  Constitutional:      General: He is not in acute distress.    Appearance: Normal appearance. He is normal weight.  HENT:     Head: Normocephalic and atraumatic.     Mouth/Throat:     Mouth: Mucous membranes are moist.     Pharynx: Oropharynx is clear. No oropharyngeal exudate or posterior oropharyngeal erythema.  Eyes:     General: No scleral icterus.    Extraocular Movements: Extraocular movements intact.     Conjunctiva/sclera: Conjunctivae normal.     Pupils: Pupils are equal, round, and reactive to light.  Cardiovascular:     Rate and Rhythm: Normal rate and regular rhythm.     Heart sounds: Normal heart sounds. No murmur heard. No friction rub. No gallop.   Pulmonary:     Effort: Pulmonary effort is normal.     Breath sounds: Normal breath sounds. No wheezing, rhonchi or rales.  Chest:  Breasts:     Right: No axillary adenopathy or supraclavicular adenopathy.     Left: No axillary adenopathy or supraclavicular adenopathy.    Abdominal:     General: Bowel sounds are normal. There is no distension.     Palpations: Abdomen is soft. There is no hepatomegaly, splenomegaly or mass.     Tenderness: There is no abdominal tenderness.  Musculoskeletal:        General: Normal range of motion.     Cervical back: Normal range of motion and neck supple. No tenderness.     Right lower leg: No edema.     Left lower leg: No edema.  Lymphadenopathy:     Cervical: No cervical adenopathy.     Upper Body:     Right upper body: No supraclavicular or axillary adenopathy.     Left upper body: No supraclavicular or axillary adenopathy.     Lower Body: No right inguinal adenopathy. No left inguinal adenopathy.  Skin:    General: Skin is warm and dry.     Coloration:  Skin is not jaundiced.     Findings: No rash.  Neurological:     Mental Status: He is alert and oriented to person, place, and time.     Cranial Nerves: No cranial nerve deficit.  Psychiatric:        Mood and Affect: Mood normal.        Behavior: Behavior normal.        Thought Content: Thought content normal.     LABS:   CBC Latest Ref Rng & Units 02/12/2021 11/07/2020 09/25/2020  WBC - 6.0 10.5 4.2  Hemoglobin 13.5 - 17.5 13.5 14.4 13.0  Hematocrit 41 - 53 39(A) 41 36.1(L)  Platelets 150 - 399 202 249 197   CMP Latest Ref Rng & Units 02/12/2021 11/07/2020 09/25/2020  Glucose 70 - 99 mg/dL - - 105(H)  BUN 4 - 21 14 12 10   Creatinine 0.6 - 1.3 0.6 0.7 0.68  Sodium 137 - 147 135(A) 133(A) 135  Potassium 3.4 - 5.3 3.7 3.9 3.5  Chloride 99 - 108 103 102 103  CO2 13 - 22 26(A) 23(A) 24  Calcium 8.7 - 10.7 8.2(A) 8.4(A) 8.0(L)  Total Protein 6.5 - 8.1 g/dL - - -  Total Bilirubin 0.3 - 1.2 mg/dL - - -  Alkaline Phos 25 - 125 56 65 -  AST 14 - 40 33 33 -  ALT 10 - 40 24 28 -     No results found for: CEA1 / No results found for: CEA1 No results found for: PSA1 No results found for: HAL937 No results found for: TKW409  No results found for: TOTALPROTELP, ALBUMINELP, A1GS, A2GS, BETS, BETA2SER, GAMS, MSPIKE, SPEI No results found for: TIBC, FERRITIN, IRONPCTSAT No results found for: LDH  STUDIES:  No results found.    HISTORY:   Past Medical History:  Diagnosis Date  . Arthritis    on PRN meds  . Asthma    uses inhaler  . BPH (benign prostatic hyperplasia)   . CAD (coronary artery disease)   . Cataract    bilateral sx  . Cholangiocarcinoma of biliary tract ([place])   . Chronic myelogenous leukemia ([place])   . COPD (chronic obstructive pulmonary disease) ([facility])    uses inhaler  . DJD (degenerative joint disease)   . Dyslipidemia   . Family history of colonic polyps   . Gout   . HTN (hypertension)    on meds  . Hypercholesterolemia   . Lung cancer ([place])   . Myelogenous  leukemia ([place])   . OA (osteoarthritis)   . PAD (peripheral artery disease) ([place])   . Renal insufficiency   . Tendonitis    patellar    Past Surgical History:  Procedure Laterality Date  . Bowel duct surg  2014   Bowel cancer  . CARDIAC CATHETERIZATION  2000  . CHOLECYSTECTOMY    . COLONOSCOPY  06/23/2015   mild sigmoid diverticulosis. small external and internal hemorrhoids. otherwise normal colonoscopy to terminal ileum  . LUNG CANCER SURGERY  Jan. 2016   partial lobectomy  . SHOULDER OPEN ROTATOR CUFF REPAIR Right 09/27/2020   Procedure: Right shoulder mini open rotator cuff repair, distal clavicle resection;  Surgeon: Susa Day, MD;  Location: [facility];  Service: Orthopedics;  Laterality: Right;  90 mins  Choice with Block anesthesia  . wipple procedure     stage I- due to bile duct cancer-2014 at [facility] (Dr Vernard Gambles). Stage IA cholangiocarcinoma    Family History  Problem Relation Age of Onset  . Prostate cancer Father 53       deceased  . Hypertension Mother   . Hypertension Brother   . Hypertension Sister   . Asthma Sister   . Esophageal cancer Neg Hx   . Colon polyps Neg Hx   . Colon cancer Neg Hx   . Stomach cancer Neg Hx   . Rectal cancer Neg Hx     Social History:  reports that he has quit smoking. His smoking use included cigarettes. He has a 10.00 pack-year smoking history. He has never used smokeless tobacco. He reports current alcohol use of about 6.0 standard drinks of alcohol per week. He reports that he does not use drugs.The patient is {Blank single:19197::"alone","accompanied by"} *** today.  Allergies:  Allergies  Allergen Reactions  . Pneumococcal Vaccines Swelling    Current Medications: Current Outpatient Medications  Medication Sig Dispense Refill  . albuterol (PROVENTIL HFA;VENTOLIN HFA) 108 (90 BASE) MCG/ACT inhaler Inhale 2 puffs into the lungs every 6 (six) hours as needed for wheezing or shortness of breath.    . allopurinol (ZYLOPRIM) 300  MG  tablet Take 300 mg by mouth daily.    . calcium carbonate (TUMS - DOSED IN MG ELEMENTAL CALCIUM) 500 MG chewable tablet Chew 1 tablet by mouth daily as needed (heartburn).    . Cholecalciferol (VITAMIN D) 50 MCG (2000 UT) tablet Take 2,000 Units by mouth daily.    . cyclobenzaprine (FLEXERIL) 5 MG tablet Take 5 mg by mouth at bedtime as needed.    . diclofenac Sodium (VOLTAREN) 1 % GEL Apply topically.    Mariane Baumgarten Sodium (COLACE PO) Take by mouth daily as needed.    . fluticasone (FLONASE) 50 MCG/ACT nasal spray fluticasone propionate 50 mcg/actuation nasal spray,suspension  INHALE 1 SPRAY DAILY IN EACH NOSTRIL FOR RHINITIS    . HYDROcodone-acetaminophen (NORCO) 7.5-325 MG tablet Take 1 tablet by mouth 3 (three) times daily.    Marland Kitchen losartan-hydrochlorothiazide (HYZAAR) 50-12.5 MG tablet Take 2 tablets by mouth daily.    . methocarbamol (ROBAXIN) 500 MG tablet Take 500 mg by mouth 2 (two) times daily as needed for muscle spasms.    . polyethylene glycol (MIRALAX / GLYCOLAX) 17 g packet Take 17 g by mouth daily. 14 each 0  . SPRYCEL 100 MG tablet TAKE 1 TABLET BY MOUTH  DAILY 30 tablet 5  . tamsulosin (FLOMAX) 0.4 MG CAPS capsule Take 0.4 mg by mouth daily.     . TRELEGY ELLIPTA 100-62.5-25 MCG/INH AEPB daily at 6 (six) AM.     No current facility-administered medications for this visit.     ASSESSMENT & PLAN:   Assessment:  Kyle Balthazor Sr. is a 70 y.o. male ***  1. 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 He had a mild relapse in May 2018, but by that August, the testing returned to complete molecular remission and he has remained in complete molecular remission since that time. PCR for BCR/ABL is pending from today.He knows to continue dasatinib daily.   2. History of stage IA non-small cell lung cancer in 2016 treated with surgery alone. Imaging in August 2021 not reveal any evidence of recurrence.   3. History of stage IA cholangiocarcinoma in 2014 treated with  surgery alone. He has stable intermittent abdominal pain, which may be related to dasatinib, but we suspect this could represent adhesions from surgery.   4. Rare tobacco use, but I once again advised him on the importance of complete abstinence from tobacco.   5. Mild hypocalcemia, resolved with Tums4times daily.   6. B12 deficiency. He remains on B12 injections monthly with his primary care office.  7.History of gastric ulcers with H pylori infection. This was treated appropriately by Dr. Lyndel Safe. He does not have symptoms of recurrence.  8.  Recent right rotator cuff shoulder surgery.  He is healing well and has a sling in place.     Plan: ***  The patient understands the plans discussed today and is in agreement with them.  He knows to contact our office if he develops concerns prior to his next appointment.   I provided *** minutes (8:44 AM - 8:44 AM) of face-to-face time during this this encounter and > 50% was spent counseling as documented under my assessment and plan.    Marvia Pickles, PA-C

## 2021-02-23 ENCOUNTER — Telehealth: Payer: Self-pay

## 2021-02-23 ENCOUNTER — Encounter: Payer: Self-pay | Admitting: Gastroenterology

## 2021-02-23 DIAGNOSIS — R933 Abnormal findings on diagnostic imaging of other parts of digestive tract: Secondary | ICD-10-CM | POA: Insufficient documentation

## 2021-02-23 DIAGNOSIS — Z860100 Personal history of colon polyps, unspecified: Secondary | ICD-10-CM | POA: Insufficient documentation

## 2021-02-23 DIAGNOSIS — Z8711 Personal history of peptic ulcer disease: Secondary | ICD-10-CM | POA: Insufficient documentation

## 2021-02-23 DIAGNOSIS — D124 Benign neoplasm of descending colon: Secondary | ICD-10-CM

## 2021-02-23 HISTORY — DX: Abnormal findings on diagnostic imaging of other parts of digestive tract: R93.3

## 2021-02-23 HISTORY — DX: Personal history of peptic ulcer disease: Z87.11

## 2021-02-23 HISTORY — DX: Benign neoplasm of descending colon: D12.4

## 2021-02-23 HISTORY — DX: Personal history of colon polyps, unspecified: Z86.0100

## 2021-02-23 NOTE — Progress Notes (Signed)
[facility] VISIT   Primary Care Provider Philmore Pali, NP [place] [id number] [phone]  Referring Provider Dr. Lyndel Safe  Patient Profile: Kyle Quale. is a 70 y.o. male with a pmh significant for CAD, COPD/asthma, hyperlipidemia, PAD, lung cancer, CML, prior cholangiocarcinoma status post Whipple, colon polyps, PUD (manifested as GUs), GERD, diverticulosis.  The patient presents to the [facility] for an evaluation and management of problem(s) noted below:  Problem List 1. Colon cancer screening   2. History of colon polyps   3. Adenomatous polyp of descending colon   4. History of gastric ulcer   5. Abnormal colonoscopy     History of Present Illness Please see initial consultation note and prior progress notes by Dr. Lyndel Safe for full details of HPI.  Interval History The patient recently underwent a surveillance colonoscopy in May of this year.  He was found to have 22 tubular adenomas and a large descending colon polyp that was flat and sessile which also returned as a tubular adenoma on biopsies.  It is for this reason that the patient is referred for consideration of advanced resection attempt of that lesion.  Patient has an interesting history in regards to prior cholangiocarcinoma status post Whipple procedure.  He has had persistent issues of abdominal discomfort for which Dr. Lyndel Safe has been trying to work this up over time.  He also describes having had prior colonoscopies without polyps but has also had polyps.  He was quite surprised by the number of polyps with that were found on this recent evaluation.  No unintentional weight loss has been occurring.  He is not taking significant nonsteroidals on a regular basis.  GI Review of Systems Positive as above Negative for dysphagia, odynophagia, decreased appetite, melena, hematochezia, alteration of bowel habits  Review of Systems General: Denies  fevers/chills/weight loss unintentionally Cardiovascular: Denies chest pain/palpitations Pulmonary: Denies shortness of breath/cough Gastroenterological: See HPI Genitourinary: Denies darkened urine or hematuria Hematological: Denies easy bruising/bleeding Endocrine: Denies temperature intolerance Dermatological: Denies jaundice Psychological: Mood is stable   Medications Current Outpatient Medications  Medication Sig Dispense Refill  . albuterol (PROVENTIL HFA;VENTOLIN HFA) 108 (90 BASE) MCG/ACT inhaler Inhale 2 puffs into the lungs every 6 (six) hours as needed for wheezing or shortness of breath.    . allopurinol (ZYLOPRIM) 300 MG tablet Take 300 mg by mouth daily.    . calcium carbonate (TUMS - DOSED IN MG ELEMENTAL CALCIUM) 500 MG chewable tablet Chew 1 tablet by mouth daily as needed (heartburn).    . Cholecalciferol (VITAMIN D) 50 MCG (2000 UT) tablet Take 2,000 Units by mouth daily.    . cyclobenzaprine (FLEXERIL) 5 MG tablet Take 5 mg by mouth at bedtime as needed.    . diclofenac Sodium (VOLTAREN) 1 % GEL Apply topically.    Mariane Baumgarten Sodium (COLACE PO) Take by mouth daily as needed.    . fluticasone (FLONASE) 50 MCG/ACT nasal spray fluticasone propionate 50 mcg/actuation nasal spray,suspension  INHALE 1 SPRAY DAILY IN EACH NOSTRIL FOR RHINITIS    . HYDROcodone-acetaminophen (NORCO) 7.5-325 MG tablet Take 1 tablet by mouth 3 (three) times daily.    Marland Kitchen losartan-hydrochlorothiazide (HYZAAR) 50-12.5 MG tablet Take 2 tablets by mouth daily.    . methocarbamol (ROBAXIN) 500 MG tablet Take 500 mg by mouth 2 (two) times daily as needed for muscle spasms.    . polyethylene glycol (MIRALAX / GLYCOLAX) 17 g packet Take 17 g by mouth daily.  14 each 0  . SPRYCEL 100 MG tablet TAKE 1 TABLET BY MOUTH  DAILY 30 tablet 5  . tamsulosin (FLOMAX) 0.4 MG CAPS capsule Take 0.4 mg by mouth daily.     . TRELEGY ELLIPTA 100-62.5-25 MCG/INH AEPB daily at 6 (six) AM.     No current  facility-administered medications for this visit.    Allergies Allergies  Allergen Reactions  . Pneumococcal Vaccines Swelling    Histories Past Medical History:  Diagnosis Date  . Arthritis    on PRN meds  . Asthma    uses inhaler  . BPH (benign prostatic hyperplasia)   . CAD (coronary artery disease)   . Cataract    bilateral sx  . Cholangiocarcinoma of biliary tract ([place])   . Chronic myelogenous leukemia ([place])   . COPD (chronic obstructive pulmonary disease) ([facility])    uses inhaler  . DJD (degenerative joint disease)   . Dyslipidemia   . Family history of colonic polyps   . Gout   . HTN (hypertension)    on meds  . Hypercholesterolemia   . Lung cancer ([place])   . Myelogenous leukemia ([place])   . OA (osteoarthritis)   . PAD (peripheral artery disease) ([facility])   . Renal insufficiency   . Tendonitis    patellar   Past Surgical History:  Procedure Laterality Date  . Bowel duct surg  2014   Bowel cancer  . CARDIAC CATHETERIZATION  2000  . CHOLECYSTECTOMY    . COLONOSCOPY  06/23/2015   mild sigmoid diverticulosis. small external and internal hemorrhoids. otherwise normal colonoscopy to terminal ileum  . LUNG CANCER SURGERY  Jan. 2016   partial lobectomy  . SHOULDER OPEN ROTATOR CUFF REPAIR Right 09/27/2020   Procedure: Right shoulder mini open rotator cuff repair, distal clavicle resection;  Surgeon: Susa Day, MD;  Location: [facility];  Service: Orthopedics;  Laterality: Right;  90 mins  Choice with Block anesthesia  . wipple procedure     stage I- due to bile duct cancer-2014 at [facility] (Dr Vernard Gambles). Stage IA cholangiocarcinoma   Social History   Socioeconomic History  . Marital status: Married    Spouse name: Not on file  . Number of children: 2  . Years of education: Not on file  . Highest education level: Not on file  Occupational History  . Occupation: environmental  Tobacco Use  . Smoking status: Former Smoker    Packs/day: 0.25    Years: 40.00    Pack  years: 10.00    Types: Cigarettes  . Smokeless tobacco: Never Used  . Tobacco comment: smokes 1-2 cigarettes at night when drinking 07/02/16. Quit 4 months ago  Vaping Use  . Vaping Use: Never used  Substance and Sexual Activity  . Alcohol use: Yes    Alcohol/week: 6.0 standard drinks    Types: 6 Standard drinks or equivalent per week  . Drug use: Never  . Sexual activity: Not on file  Other Topics Concern  . Not on file  Social History Narrative   Married, 2 children.   Works moves heavy equipment x 30 yrs.   Social Determinants of Health   Financial Resource Strain: Not on file  Food Insecurity: Not on file  Transportation Needs: Not on file  Physical Activity: Not on file  Stress: Not on file  Social Connections: Not on file  Intimate Partner Violence: Not on file   Family History  Problem Relation Age of Onset  . Prostate cancer Father 26  deceased  . Hypertension Mother   . Hypertension Brother   . Hypertension Sister   . Asthma Sister   . Esophageal cancer Neg Hx   . Colon polyps Neg Hx   . Colon cancer Neg Hx   . Stomach cancer Neg Hx   . Rectal cancer Neg Hx    I have reviewed his medical, social, and family history in detail and updated the electronic medical record as necessary.    PHYSICAL EXAMINATION  BP 140/72   Pulse 70   Ht 5\' 10"  (1.778 m)   Wt 218 lb (98.9 kg)   BMI 31.28 kg/m  Wt Readings from Last 3 Encounters:  02/20/21 218 lb (98.9 kg)  01/25/21 217 lb (98.4 kg)  01/10/21 217 lb (98.4 kg)  GEN: NAD, appears younger than stated age, doesn't appear chronically ill PSYCH: Cooperative, without pressured speech EYE: Conjunctivae pink, sclerae anicteric ENT: Masked CV: Nontachycardic RESP: No audible wheezing GI: NABS, soft, surgical scars present, tenderness to palpation in right upper quadrant region and mid epigastrium upon deep palpation, no rebound or guarding MSK/EXT: No lower extremity edema SKIN: No jaundice NEURO:  Alert &  Oriented x 3, no focal deficits   REVIEW OF DATA  I reviewed the following data at the time of this encounter:  GI Procedures and Studies  May 2022 colonoscopy -Multiple colonic polyps (22) s/p polypectomy. -One 20 mm flat sessile polyp in the proximal descending colon. Biopsied. Tattooed. -Mild pancolonic diverticulosis -Non-bleeding internal hemorrhoids. -The examination was otherwise normal on direct and retroflexion views. Pathology Diagnosis 1. Surgical [P], colon, descending and distal transverse, polyp (4) - MULTIPLE FRAGMENTS OF TUBULAR ADENOMA(S) - NO HIGH-GRADE DYSPLASIA OR MALIGNANCY IDENTIFIED 2. Surgical [P], colon, cecum, polyp (1) - MULTIPLE FRAGMENTS OF TUBULAR ADENOMA(S) - NO HIGH-GRADE DYSPLASIA OR MALIGNANCY IDENTIFIED 3. Surgical [P], colon, ascending, polyp (12) - MULTIPLE FRAGMENTS OF TUBULAR ADENOMA(S) - NO HIGH-GRADE DYSPLASIA OR MALIGNANCY IDENTIFIED 4. Surgical [P], colon, transverse, polyp (4) - TUBULAR ADENOMA (3 OF 3 FRAGMENTS) - NO HIGH-GRADE DYSPLASIA OR MALIGNANCY IDENTIFIED 5. Surgical [P], colon, descending, polyp (1) - TUBULAR ADENOMA (3 OF 3 FRAGMENTS) - NO HIGH-GRADE DYSPLASIA OR MALIGNANCY IDENTIFIED 6. Surgical [P], colon, sigmoid, polyp (1) - TUBULAR ADENOMA (6 OF 6 FRAGMENTS) - NO HIGH-GRADE DYSPLASIA OR MALIGNANCY  Laboratory Studies  Reviewed those in epic  Imaging Studies  No relevant studies to review   ASSESSMENT  Mr. Babler is a 70 y.o. male with a pmh significant for CAD, COPD/asthma, hyperlipidemia, PAD, lung cancer, CML, prior cholangiocarcinoma status post Whipple, colon polyps, PUD (manifested as GUs), GERD, diverticulosis.  The patient is seen today for evaluation and management of:  1. Colon cancer screening   2. History of colon polyps   3. Adenomatous polyp of descending colon   4. History of gastric ulcer   5. Abnormal colonoscopy    The patient is clinically and hemodynamically stable.  Based upon the  description and endoscopic pictures I do feel that it is reasonable to pursue an Advanced Polypectomy attempt of the polyp/lesion in the descending colon.  We discussed some of the techniques of advanced polypectomy which include Endoscopic Mucosal Resection, OVESCO Full-Thickness Resection, Endorotor Morcellation, and Tissue Ablation via Fulguration.  We also reviewed images of typical techniques as noted above.  The risks and benefits of endoscopic evaluation were discussed with the patient; these include but are not limited to the risk of perforation, infection, bleeding, missed lesions, lack of diagnosis, severe illness requiring hospitalization,  as well as anesthesia and sedation related illnesses.  During attempts at advanced resection, the risks of bleeding and perforation/leak are increased as opposed to diagnostic and screening procedures, and that was discussed with the patient as well.   In addition, I explained that with the possible need for piecemeal resection, subsequent short-interval endoscopic evaluation for follow up and potential retreatment of the lesion/area may be necessary.  I did offer, a referral to surgery in order for patient to have opportunity to discuss surgical management/intervention prior to finalizing decision for attempt at endoscopic removal, however, the patient deferred on this.  If, after attempt at removal of the polyp/lesion, it is found that the patient has a complication or that an invasive lesion or malignant lesion is found, or that the polyp/lesion continues to recur, the patient is aware and understands that surgery may still be indicated/required.  All patient questions were answered, to the best of my ability, and the patient agrees to the aforementioned plan of action with follow-up as indicated.   PLAN  Preprocedure labs have already been obtained within the last few months will not need to be redone Proceed with scheduling colonoscopy with EMR attempt of  descending colon polyp with 2-day preparation   Orders Placed This Encounter  Procedures  . Procedural/ Surgical Case Request: COLONOSCOPY WITH PROPOFOL, ENDOSCOPIC MUCOSAL RESECTION  . Ambulatory referral to Gastroenterology    New Prescriptions   No medications on file   Modified Medications   No medications on file    Planned Follow Up No follow-ups on file.   Total Time in Face-to-Face and in Coordination of Care for patient including independent/personal interpretation/review of prior testing, medical history, examination, medication adjustment, communicating results with the patient directly, and documentation with the EHR is 35 minutes.   Kyle Britain, MD [facility] Advanced Endoscopy Office # [phone]

## 2021-02-23 NOTE — Telephone Encounter (Signed)
-----   Message from Irving Copas., MD sent at 02/23/2021  5:13 AM EDT ----- Helmut Muster, Please add on an endoscopy for history of prior gastric ulcers to the patient's colonoscopy.  Thanks. GM

## 2021-03-15 DIAGNOSIS — Z8042 Family history of malignant neoplasm of prostate: Secondary | ICD-10-CM | POA: Diagnosis not present

## 2021-03-15 DIAGNOSIS — R351 Nocturia: Secondary | ICD-10-CM | POA: Diagnosis not present

## 2021-03-15 DIAGNOSIS — N402 Nodular prostate without lower urinary tract symptoms: Secondary | ICD-10-CM | POA: Diagnosis not present

## 2021-03-15 DIAGNOSIS — N401 Enlarged prostate with lower urinary tract symptoms: Secondary | ICD-10-CM | POA: Diagnosis not present

## 2021-03-23 DIAGNOSIS — D519 Vitamin B12 deficiency anemia, unspecified: Secondary | ICD-10-CM | POA: Diagnosis not present

## 2021-03-28 ENCOUNTER — Telehealth: Payer: Self-pay

## 2021-03-28 NOTE — Telephone Encounter (Signed)
Pt agreed to reschedule apt. Appt scheduled for 03/23/21 with Dr Hinton Rao.

## 2021-04-01 ENCOUNTER — Other Ambulatory Visit: Payer: Self-pay | Admitting: Oncology

## 2021-04-01 DIAGNOSIS — C9210 Chronic myeloid leukemia, BCR/ABL-positive, not having achieved remission: Secondary | ICD-10-CM

## 2021-04-02 ENCOUNTER — Encounter: Payer: Self-pay | Admitting: Oncology

## 2021-04-02 ENCOUNTER — Telehealth: Payer: Self-pay | Admitting: Oncology

## 2021-04-02 ENCOUNTER — Inpatient Hospital Stay: Payer: Medicare Other | Attending: Hematology and Oncology | Admitting: Oncology

## 2021-04-02 ENCOUNTER — Inpatient Hospital Stay: Payer: Medicare Other

## 2021-04-02 ENCOUNTER — Other Ambulatory Visit: Payer: Self-pay | Admitting: Hematology and Oncology

## 2021-04-02 ENCOUNTER — Other Ambulatory Visit: Payer: Self-pay | Admitting: Oncology

## 2021-04-02 VITALS — BP 184/89 | HR 69 | Temp 97.9°F | Resp 18 | Ht 70.0 in | Wt 220.0 lb

## 2021-04-02 DIAGNOSIS — C3492 Malignant neoplasm of unspecified part of left bronchus or lung: Secondary | ICD-10-CM | POA: Diagnosis not present

## 2021-04-02 DIAGNOSIS — C9210 Chronic myeloid leukemia, BCR/ABL-positive, not having achieved remission: Secondary | ICD-10-CM

## 2021-04-02 DIAGNOSIS — C249 Malignant neoplasm of biliary tract, unspecified: Secondary | ICD-10-CM

## 2021-04-02 LAB — CBC AND DIFFERENTIAL
HCT: 39 — AB (ref 41–53)
Hemoglobin: 13.9 (ref 13.5–17.5)
Neutrophils Absolute: 2.79
Platelets: 193 (ref 150–399)
WBC: 6.8

## 2021-04-02 LAB — HEPATIC FUNCTION PANEL
ALT: 25 (ref 10–40)
AST: 42 — AB (ref 14–40)
Alkaline Phosphatase: 65 (ref 25–125)
Bilirubin, Total: 0.6

## 2021-04-02 LAB — BASIC METABOLIC PANEL
BUN: 12 (ref 4–21)
CO2: 23 — AB (ref 13–22)
Chloride: 104 (ref 99–108)
Creatinine: 0.6 (ref 0.6–1.3)
Glucose: 121
Potassium: 3.8 (ref 3.4–5.3)
Sodium: 134 — AB (ref 137–147)

## 2021-04-02 LAB — CBC
MCV: 98 — AB (ref 80–94)
RBC: 4.03 (ref 3.87–5.11)

## 2021-04-02 LAB — COMPREHENSIVE METABOLIC PANEL
Albumin: 3.8 (ref 3.5–5.0)
Calcium: 8 — AB (ref 8.7–10.7)

## 2021-04-02 NOTE — Progress Notes (Unsigned)
Prior Authorization is required for BCR/ABL 1 & FISH; Submitted PA request and faxed clinicals to [facility] [phone]; pending review. May take up to 3 days.

## 2021-04-02 NOTE — Telephone Encounter (Signed)
7/11 los next appt scheduled and given to patient

## 2021-04-02 NOTE — Telephone Encounter (Signed)
Per 7/11 los next appt scheduled and given to patient

## 2021-04-03 DIAGNOSIS — G2581 Restless legs syndrome: Secondary | ICD-10-CM | POA: Diagnosis not present

## 2021-04-03 DIAGNOSIS — J449 Chronic obstructive pulmonary disease, unspecified: Secondary | ICD-10-CM | POA: Diagnosis not present

## 2021-04-03 DIAGNOSIS — J301 Allergic rhinitis due to pollen: Secondary | ICD-10-CM | POA: Diagnosis not present

## 2021-04-03 NOTE — Progress Notes (Signed)
[facility]  [address] [place],  [place]  [address] [phone]  Clinic Day:  04/03/2021  Referring physician: Philmore Pali, NP  This document serves as a record of services personally performed by Hosie Poisson, MD. It was created on their behalf by Curry,Lauren E, a trained medical scribe. The creation of this record is based on the scribe's personal observations and the provider's statements to them.  CHIEF COMPLAINT:  CC: Chronic myelogenous leukemia  Current Treatment:  Dasatinib100 mg daily   HISTORY OF PRESENT ILLNESS:  Kyle Fetch Sr. is a 70 y.o. male with chronic myelogenous leukemia diagnosed in October 2015 when he presented with leukocytosis.  He was originally placed on nilotinib, but developed a severe pruritic rash, so was switched to dasatinib 100 mg daily.  He achieved a major molecular remission by May 2016.   He also has a history of a stage IA cholangiocarcinoma, treated with surgical resection in June 2014.   He then underwent surgical resection of a stage IA (T1 N0 M0) non-small cell lung cancer in February 2016.  Pathology revealed a 3 cm, well-differentiated, adenocarcinoma with negative nodes.  Adjuvant chemotherapy for his lung cancer was not recommended.  He has chronic back pain and continues seeing spine specialist.  He apparently has significant degenerative disc disease.  He has also had upper abdominal pain, which he attributes to dasatinib.  He uses hydrocodone/APAP as needed for his pain.  In May 2018, PCR was positive at 0.012%, consistent with residual CML.  Repeat PCR in August 2018 revealed a major molecular response.  His CML has remained in remission since that time.  CT chest, abdomen and pelvis in November 2019 did not reveal evidence of recurrence of either malignancy.     CT chest in August 2020 did not reveal any evidence of recurrent lung malignancy.  He has had hypocalcemia, so was instructed to  take calcium 600 mg twice daily.  When he was seen in November 2020, had been having lower leg pain worsening throughout the day.  He underwent arterial ultrasound/ankle brachial indices, which was normal.  The pain was then felt to possibly be degenerative in nature.  He reported constipation due to the calcium and as his on calcium was normal at that time,  we decreased the calcium to daily.  He had worsening neutropenia and was found to have B12 and placed on B12 injections weekly for 4 weeks, then monthly.  Serum folate was normal.  He had persistent abdominal pain, so was referred to Dr. Lyndel Safe.  He was seen by Dr. Lyndel Safe in 2021 and had 2 gastric ulcers and H. Pylori infection, which was treated.  He saw Dr. Nila Nephew earlier in 2021 as well, and PSA was normal. When he was seen in August, he had recurrent hypocalcemia, with a calcium of 8.3.  He could not tolerate the calcium supplement, so we recommended that he take Tums 4 times daily.  He continued to complain of intermittent abdominal pain, which we have attributed to adhesions.  He has had rotator cuff surgery in January 2022.  He has had a colonoscopy by Dr. Lyndel Safe with findings of a large polyp.  He will be going back on August 8th to have this resected through colonoscopy.  He missed his appointment in May but Dr. Lyndel Safe told him "his labs were good".  I looked up and his PCR for BCR/ABL continues to be negative on the May reading.  INTERVAL  HISTORY:  Kyle Montoya is here for routine follow up and states that he has been well.  He continues to use TUMS.  He normally has his annual scans in August, but we will wait until his next appointment.  His hemoglobin came up from 13.5 to 13.9 and the rest of his CBC and CMP is unremarkable.  His  appetite is good, and he has gained 2 pounds since his last visit.  He denies fever, chills or other signs of infection.  He denies nausea, vomiting, bowel issues, or abdominal pain.  He denies sore throat, cough, dyspnea, or  chest pain.  REVIEW OF SYSTEMS:  Review of Systems  Constitutional: Negative.  Negative for appetite change, chills, fatigue, fever and unexpected weight change.  HENT:  Negative.    Eyes: Negative.   Respiratory: Negative.  Negative for chest tightness, cough, hemoptysis, shortness of breath and wheezing.   Cardiovascular: Negative.  Negative for chest pain, leg swelling and palpitations.  Gastrointestinal:  Positive for abdominal pain. Negative for abdominal distention, blood in stool, constipation, diarrhea, nausea and vomiting.  Endocrine: Negative.   Genitourinary: Negative.  Negative for difficulty urinating, dysuria, frequency and hematuria.   Musculoskeletal: Negative.  Negative for arthralgias, back pain, flank pain, gait problem and myalgias.  Skin: Negative.   Neurological: Negative.  Negative for dizziness, extremity weakness, gait problem, headaches, light-headedness, numbness, seizures and speech difficulty.  Hematological: Negative.   Psychiatric/Behavioral: Negative.  Negative for depression and sleep disturbance. The patient is not nervous/anxious.   All other systems reviewed and are negative.   VITALS:  Blood pressure (!) 184/89, pulse 69, temperature 97.9 F (36.6 C), temperature source Oral, resp. rate 18, height 5\' 10"  (1.778 m), weight 220 lb (99.8 kg), SpO2 96 %.  Wt Readings from Last 3 Encounters:  04/02/21 220 lb (99.8 kg)  02/20/21 218 lb (98.9 kg)  01/25/21 217 lb (98.4 kg)    Body mass index is 31.57 kg/m.  Performance status (ECOG): 1 - Symptomatic but completely ambulatory  PHYSICAL EXAM:  Physical Exam Constitutional:      General: He is not in acute distress.    Appearance: Normal appearance. He is normal weight.  HENT:     Head: Normocephalic and atraumatic.  Eyes:     General: No scleral icterus.    Extraocular Movements: Extraocular movements intact.     Conjunctiva/sclera: Conjunctivae normal.     Pupils: Pupils are equal, round, and  reactive to light.  Cardiovascular:     Rate and Rhythm: Normal rate and regular rhythm.     Pulses: Normal pulses.     Heart sounds: Normal heart sounds. No murmur heard.   No friction rub. No gallop.  Pulmonary:     Effort: Pulmonary effort is normal. No respiratory distress.     Breath sounds: Normal breath sounds.  Abdominal:     General: Bowel sounds are normal. There is no distension.     Palpations: Abdomen is soft. There is no hepatomegaly, splenomegaly or mass.     Tenderness: There is no abdominal tenderness.  Musculoskeletal:        General: Normal range of motion.     Cervical back: Normal range of motion and neck supple.     Right lower leg: No edema.     Left lower leg: No edema.  Lymphadenopathy:     Cervical: No cervical adenopathy.  Skin:    General: Skin is warm and dry.  Neurological:     General: No  focal deficit present.     Mental Status: He is alert and oriented to person, place, and time. Mental status is at baseline.  Psychiatric:        Mood and Affect: Mood normal.        Behavior: Behavior normal.        Thought Content: Thought content normal.        Judgment: Judgment normal.    LABS:   CBC Latest Ref Rng & Units 04/02/2021 02/12/2021 11/07/2020  WBC - 6.8 6.0 10.5  Hemoglobin 13.5 - 17.5 13.9 13.5 14.4  Hematocrit 41 - 53 39(A) 39(A) 41  Platelets 150 - 399 193 202 249   CMP Latest Ref Rng & Units 04/02/2021 02/12/2021 11/07/2020  Glucose 70 - 99 mg/dL - - -  BUN 4 - 21 12 14 12   Creatinine 0.6 - 1.3 0.6 0.6 0.7  Sodium 137 - 147 134(A) 135(A) 133(A)  Potassium 3.4 - 5.3 3.8 3.7 3.9  Chloride 99 - 108 104 103 102  CO2 13 - 22 23(A) 26(A) 23(A)  Calcium 8.7 - 10.7 8.0(A) 8.2(A) 8.4(A)  Total Protein 6.5 - 8.1 g/dL - - -  Total Bilirubin 0.3 - 1.2 mg/dL - - -  Alkaline Phos 25 - 125 65 56 65  AST 14 - 40 42(A) 33 33  ALT 10 - 40 25 24 28     STUDIES:  No results found.   Allergies:  Allergies  Allergen Reactions   Pneumococcal  Vaccines Swelling    Current Medications: Current Outpatient Medications  Medication Sig Dispense Refill   albuterol (PROVENTIL HFA;VENTOLIN HFA) 108 (90 BASE) MCG/ACT inhaler Inhale 2 puffs into the lungs every 6 (six) hours as needed for wheezing or shortness of breath.     allopurinol (ZYLOPRIM) 300 MG tablet Take 300 mg by mouth daily.     calcium carbonate (TUMS - DOSED IN MG ELEMENTAL CALCIUM) 500 MG chewable tablet Chew 1 tablet by mouth daily as needed (heartburn).     Cholecalciferol (VITAMIN D) 50 MCG (2000 UT) tablet Take 2,000 Units by mouth daily.     cyclobenzaprine (FLEXERIL) 5 MG tablet Take 5 mg by mouth at bedtime as needed.     diclofenac Sodium (VOLTAREN) 1 % GEL Apply topically.     Docusate Sodium (COLACE PO) Take by mouth daily as needed.     fluticasone (FLONASE) 50 MCG/ACT nasal spray fluticasone propionate 50 mcg/actuation nasal spray,suspension  INHALE 1 SPRAY DAILY IN EACH NOSTRIL FOR RHINITIS     HYDROcodone-acetaminophen (NORCO) 7.5-325 MG tablet Take 1 tablet by mouth 3 (three) times daily.     irbesartan-hydrochlorothiazide (AVALIDE) 150-12.5 MG tablet Take 1 tablet by mouth daily.     losartan-hydrochlorothiazide (HYZAAR) 50-12.5 MG tablet Take 2 tablets by mouth daily.     methocarbamol (ROBAXIN) 500 MG tablet Take 500 mg by mouth 2 (two) times daily as needed for muscle spasms.     polyethylene glycol (MIRALAX / GLYCOLAX) 17 g packet Take 17 g by mouth daily. 14 each 0   polyethylene glycol-electrolytes (NULYTELY) 420 g solution Take 4,000 mLs by mouth once.     SPRYCEL 100 MG tablet TAKE 1 TABLET BY MOUTH  DAILY 30 tablet 5   tamsulosin (FLOMAX) 0.4 MG CAPS capsule Take 0.4 mg by mouth daily.      TRELEGY ELLIPTA 100-62.5-25 MCG/INH AEPB daily at 6 (six) AM.     No current facility-administered medications for this visit.     ASSESSMENT & PLAN:  Assessment:   1. 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  He had a mild relapse in May 2018, but by  that August, the testing returned to complete molecular remission and he has remained in complete molecular remission since that time. PCR for BCR/ABL is pending from today. He knows to continue dasatinib daily.   2. History of stage IA non-small cell lung cancer in 2016 treated with surgery alone.  Imaging in August 2021 not reveal any evidence of recurrence.   3. History of stage IA cholangiocarcinoma in 2014 treated with surgery alone.  He has stable intermittent abdominal pain, which may be related to dasatinib, but we suspect this could represent adhesions from surgery.    4. Rare tobacco use, but I once again advised him on the importance of complete abstinence from tobacco.   5. Mild hypocalcemia, resolved with Tums 4 times daily.   6. B12 deficiency.  He remains on B12 injections monthly with his primary care office.  7. History of gastric ulcers with H pylori infection.  This was treated appropriately by Dr. Lyndel Safe.  He does not have symptoms of recurrence.    8.  Right rotator cuff shoulder surgery in early 2022.    Plan: We will plan to see him back in 3 months with a CBC, comprehensive metabolic panel, CEA, CA 93-2, PCR for BCR/ABL, and CT chest, abdomen and pelvis.  The patient understands the plans discussed today and is in agreement with them.  He knows to contact our office if he develops concerns prior to his next appointment.    I provided 15 minutes of face-to-face time during this this encounter and > 50% was spent counseling as documented under my assessment and plan.    [name], MD [facility] AT [facility] [address] [place] [address] Dept: [phone] Dept Fax: [phone]   I, Rita Ohara, am acting as scribe for [name], MD  I have reviewed this report as typed by the medical scribe, and it is complete and accurate.  Hermina Barters

## 2021-04-04 ENCOUNTER — Other Ambulatory Visit: Payer: Self-pay

## 2021-04-04 DIAGNOSIS — C9210 Chronic myeloid leukemia, BCR/ABL-positive, not having achieved remission: Secondary | ICD-10-CM

## 2021-04-04 NOTE — Progress Notes (Unsigned)
Received auth for CPT [phone]

## 2021-04-09 LAB — BCR-ABL1 FISH
Cells Analyzed: 200
Cells Counted: 200

## 2021-04-12 ENCOUNTER — Telehealth: Payer: Self-pay

## 2021-04-12 NOTE — Telephone Encounter (Addendum)
Pt notified and verbalized understanding. He replied, "that's great". ----- Message from [name], MD sent at 04/11/2021  2:58 PM EDT ----- Regarding: call pt Tell him the leukemia test shows he continues to be in remission

## 2021-04-23 DIAGNOSIS — G894 Chronic pain syndrome: Secondary | ICD-10-CM | POA: Diagnosis not present

## 2021-04-23 DIAGNOSIS — Z79891 Long term (current) use of opiate analgesic: Secondary | ICD-10-CM | POA: Diagnosis not present

## 2021-04-23 DIAGNOSIS — M47816 Spondylosis without myelopathy or radiculopathy, lumbar region: Secondary | ICD-10-CM | POA: Diagnosis not present

## 2021-04-23 DIAGNOSIS — M150 Primary generalized (osteo)arthritis: Secondary | ICD-10-CM | POA: Diagnosis not present

## 2021-04-23 DIAGNOSIS — M47812 Spondylosis without myelopathy or radiculopathy, cervical region: Secondary | ICD-10-CM | POA: Diagnosis not present

## 2021-04-25 DIAGNOSIS — D519 Vitamin B12 deficiency anemia, unspecified: Secondary | ICD-10-CM | POA: Diagnosis not present

## 2021-04-30 ENCOUNTER — Other Ambulatory Visit: Payer: Self-pay

## 2021-04-30 ENCOUNTER — Ambulatory Visit (HOSPITAL_COMMUNITY)
Admission: RE | Admit: 2021-04-30 | Discharge: 2021-04-30 | Disposition: A | Discharge status: Home / Self Care | Payer: BC Managed Care – PPO | Attending: Gastroenterology | Admitting: Gastroenterology

## 2021-04-30 ENCOUNTER — Ambulatory Visit (HOSPITAL_COMMUNITY): Payer: BC Managed Care – PPO | Admitting: Certified Registered"

## 2021-04-30 ENCOUNTER — Encounter (HOSPITAL_COMMUNITY)
Admission: RE | Disposition: A | Discharge status: Home / Self Care | Payer: Self-pay | Source: Home / Self Care | Attending: Gastroenterology

## 2021-04-30 ENCOUNTER — Encounter (HOSPITAL_COMMUNITY): Payer: Self-pay | Admitting: Gastroenterology

## 2021-04-30 DIAGNOSIS — Z902 Acquired absence of lung [part of]: Secondary | ICD-10-CM | POA: Diagnosis not present

## 2021-04-30 DIAGNOSIS — D125 Benign neoplasm of sigmoid colon: Secondary | ICD-10-CM | POA: Diagnosis not present

## 2021-04-30 DIAGNOSIS — K2970 Gastritis, unspecified, without bleeding: Secondary | ICD-10-CM | POA: Insufficient documentation

## 2021-04-30 DIAGNOSIS — D122 Benign neoplasm of ascending colon: Secondary | ICD-10-CM | POA: Insufficient documentation

## 2021-04-30 DIAGNOSIS — Z8509 Personal history of malignant neoplasm of other digestive organs: Secondary | ICD-10-CM | POA: Diagnosis not present

## 2021-04-30 DIAGNOSIS — D123 Benign neoplasm of transverse colon: Secondary | ICD-10-CM | POA: Insufficient documentation

## 2021-04-30 DIAGNOSIS — Z8719 Personal history of other diseases of the digestive system: Secondary | ICD-10-CM | POA: Diagnosis not present

## 2021-04-30 DIAGNOSIS — Z87891 Personal history of nicotine dependence: Secondary | ICD-10-CM | POA: Insufficient documentation

## 2021-04-30 DIAGNOSIS — Z8711 Personal history of peptic ulcer disease: Secondary | ICD-10-CM | POA: Diagnosis not present

## 2021-04-30 DIAGNOSIS — Z887 Allergy status to serum and vaccine status: Secondary | ICD-10-CM | POA: Diagnosis not present

## 2021-04-30 DIAGNOSIS — K289 Gastrojejunal ulcer, unspecified as acute or chronic, without hemorrhage or perforation: Secondary | ICD-10-CM | POA: Insufficient documentation

## 2021-04-30 DIAGNOSIS — K649 Unspecified hemorrhoids: Secondary | ICD-10-CM | POA: Diagnosis not present

## 2021-04-30 DIAGNOSIS — Z90411 Acquired partial absence of pancreas: Secondary | ICD-10-CM | POA: Diagnosis not present

## 2021-04-30 DIAGNOSIS — R933 Abnormal findings on diagnostic imaging of other parts of digestive tract: Secondary | ICD-10-CM

## 2021-04-30 DIAGNOSIS — D124 Benign neoplasm of descending colon: Secondary | ICD-10-CM | POA: Insufficient documentation

## 2021-04-30 DIAGNOSIS — Z9049 Acquired absence of other specified parts of digestive tract: Secondary | ICD-10-CM | POA: Diagnosis not present

## 2021-04-30 DIAGNOSIS — Z980 Intestinal bypass and anastomosis status: Secondary | ICD-10-CM | POA: Insufficient documentation

## 2021-04-30 DIAGNOSIS — D120 Benign neoplasm of cecum: Secondary | ICD-10-CM | POA: Diagnosis not present

## 2021-04-30 DIAGNOSIS — Z86010 Personal history of colonic polyps: Secondary | ICD-10-CM

## 2021-04-30 DIAGNOSIS — K644 Residual hemorrhoidal skin tags: Secondary | ICD-10-CM | POA: Diagnosis not present

## 2021-04-30 DIAGNOSIS — K635 Polyp of colon: Secondary | ICD-10-CM | POA: Diagnosis not present

## 2021-04-30 DIAGNOSIS — Z85118 Personal history of other malignant neoplasm of bronchus and lung: Secondary | ICD-10-CM | POA: Diagnosis not present

## 2021-04-30 DIAGNOSIS — K641 Second degree hemorrhoids: Secondary | ICD-10-CM | POA: Insufficient documentation

## 2021-04-30 DIAGNOSIS — Q399 Congenital malformation of esophagus, unspecified: Secondary | ICD-10-CM | POA: Diagnosis not present

## 2021-04-30 DIAGNOSIS — K5790 Diverticulosis of intestine, part unspecified, without perforation or abscess without bleeding: Secondary | ICD-10-CM | POA: Diagnosis not present

## 2021-04-30 DIAGNOSIS — K2950 Unspecified chronic gastritis without bleeding: Secondary | ICD-10-CM | POA: Diagnosis not present

## 2021-04-30 HISTORY — PX: BIOPSY: SHX5522

## 2021-04-30 HISTORY — PX: ESOPHAGOGASTRODUODENOSCOPY (EGD) WITH PROPOFOL: SHX5813

## 2021-04-30 HISTORY — PX: HEMOSTASIS CLIP PLACEMENT: SHX6857

## 2021-04-30 HISTORY — PX: COLONOSCOPY WITH PROPOFOL: SHX5780

## 2021-04-30 HISTORY — PX: ENDOSCOPIC MUCOSAL RESECTION: SHX6839

## 2021-04-30 HISTORY — PX: POLYPECTOMY: SHX5525

## 2021-04-30 HISTORY — PX: SUBMUCOSAL LIFTING INJECTION: SHX6855

## 2021-04-30 SURGERY — COLONOSCOPY WITH PROPOFOL
Anesthesia: Monitor Anesthesia Care

## 2021-04-30 MED ORDER — PROPOFOL 10 MG/ML IV BOLUS
INTRAVENOUS | Status: AC
  Filled 2021-04-30: qty 20

## 2021-04-30 MED ORDER — LACTATED RINGERS IV SOLN: INTRAVENOUS | Status: DC

## 2021-04-30 MED ORDER — SODIUM CHLORIDE 0.9 % IV SOLN: INTRAVENOUS | Status: DC

## 2021-04-30 MED ORDER — PROPOFOL 500 MG/50ML IV EMUL
INTRAVENOUS | Status: AC
  Filled 2021-04-30: qty 50

## 2021-04-30 MED ORDER — OMEPRAZOLE 40 MG PO CPDR: 40.0000 mg | DELAYED_RELEASE_CAPSULE | Freq: Every day | ORAL | 4 refills | Status: DC

## 2021-04-30 MED ORDER — PROPOFOL 10 MG/ML IV BOLUS
INTRAVENOUS | Status: DC | PRN
  Administered 2021-04-30: 20 mg via INTRAVENOUS
  Administered 2021-04-30: 30 mg via INTRAVENOUS
  Administered 2021-04-30: 20 mg via INTRAVENOUS

## 2021-04-30 MED ORDER — PROPOFOL 500 MG/50ML IV EMUL
INTRAVENOUS | Status: DC | PRN
  Administered 2021-04-30: 125 ug/kg/min via INTRAVENOUS

## 2021-04-30 MED ORDER — LIDOCAINE 2% (20 MG/ML) 5 ML SYRINGE
INTRAMUSCULAR | Status: DC | PRN
  Administered 2021-04-30: 80 mg via INTRAVENOUS

## 2021-04-30 SURGICAL SUPPLY — 24 items

## 2021-04-30 NOTE — Op Note (Signed)
[facility] Patient Name: Kyle Montoya Procedure Date: 04/30/2021 MRN: [id number] Attending MD: Justice Britain , MD Date of Birth: 1950-12-16 CSN: [id number] Age: 70 Admit Type: Outpatient Procedure:                Colonoscopy Indications:              Excision of colonic polyp Providers:                Justice Britain, MD, Carlyn Reichert, RN, Elspeth Cho Tech., Technician, [facility],                            CRNA Referring MD:             Zadie Rhine, MD Medicines:                Monitored Anesthesia Care Complications:            No immediate complications. Estimated Blood Loss:     Estimated blood loss was minimal. Procedure:                Pre-Anesthesia Assessment:                           - Prior to the procedure, a History and Physical                            was performed, and patient medications and                            allergies were reviewed. The patient's tolerance of                            previous anesthesia was also reviewed. The risks                            and benefits of the procedure and the sedation                            options and risks were discussed with the patient.                            All questions were answered, and informed consent                            was obtained. Prior Anticoagulants: The patient has                            taken no previous anticoagulant or antiplatelet                            agents. ASA Grade Assessment: III - A patient with  severe systemic disease. After reviewing the risks                            and benefits, the patient was deemed in                            satisfactory condition to undergo the procedure.                           After obtaining informed consent, the colonoscope                            was passed under direct vision. Throughout the                             procedure, the patient's blood pressure, pulse, and                            oxygen saturations were monitored continuously. The                            CF-HQ190L ([id number]) Olympus colonoscope was                            introduced through the anus and advanced to the the                            cecum, identified by appendiceal orifice and                            ileocecal valve. The colonoscopy was unusually                            difficult due to a redundant colon and significant                            looping. Successful completion of the procedure was                            aided by changing the patient's position, using                            manual pressure with two providers in the left                            lateral region and the right lateral region,                            withdrawing and reinserting the scope,                            straightening and shortening the scope to obtain  bowel loop reduction and using scope torsion. Scope In: 10:20:32 AM Scope Out: 11:36:06 AM Scope Withdrawal Time: 1 hour 5 minutes 27 seconds  Total Procedure Duration: 1 hour 15 minutes 34 seconds  Findings:      The digital rectal exam findings include hemorrhoids. Pertinent       negatives include no palpable rectal lesions.      The colon (entire examined portion) revealed grossly excessive looping.      A large amount of semi-liquid stool was found in the entire colon,       interfering with visualization. Lavage of the area was performed using       copious amounts, resulting in clearance with adequate visualization.      13 sessile polyps were found in the sigmoid colon (2), descending colon       (4), transverse colon (2), ascending colon (2) and cecum (3). The polyps       were 2 to 10 mm in size. These polyps were removed with a cold snare.       Resection and retrieval were complete.      A 35 mm polyp was found in the  descending colon. The polyp was       non-granular lateral spreading. Preparations were made for mucosal       resection. NBI imaging and White-light endoscopy was done to demarcate       the borders of the lesion. Orise gel was injected to raise the lesion.       Piecemeal mucosal resection using a snare was performed. Resection and       retrieval were complete. Fulguration to ablate the margin of the restion       of the lesion by snare tip soft cautery was successful. To prevent       bleeding after mucosal resection, seven hemostatic clips were       successfully placed (MR conditional). There was no bleeding at the end       of the procedure.      Normal mucosa was found in the entire colon otherwise.      Non-bleeding non-thrombosed external and internal hemorrhoids were found       during retroflexion, during perianal exam and during digital exam. The       hemorrhoids were Grade II (internal hemorrhoids that prolapse but reduce       spontaneously). Impression:               - Hemorrhoids found on digital rectal exam.                           - There was significant looping of the colon.                           - Stool in the entire examined colon - lavaged with                            adequate visualization.                           - 13, 2 to 10 mm polyps in the sigmoid colon, in  the descending colon, in the transverse colon, in                            the ascending colon and in the cecum, removed with                            a cold snare. Resected and retrieved.                           - One 35 mm polyp in the descending colon, removed                            with mucosal resection. Resected and retrieved.                            Treated with a hot snare. Clips (MR conditional)                            were placed.                           - Normal mucosa in the entire examined colon.                           - Non-bleeding  non-thrombosed external and internal                            hemorrhoids. Moderate Sedation:      Not Applicable - Patient had care per Anesthesia. Recommendation:           - The patient will be observed post-procedure,                            until all discharge criteria are met.                           - Discharge patient to home.                           - Patient has a contact number available for                            emergencies. The signs and symptoms of potential                            delayed complications were discussed with the                            patient. Return to normal activities tomorrow.                            Written discharge instructions were provided to the                            patient.                           -  High fiber diet.                           - Use FiberCon 1-2 tablets PO daily.                           - Continue present medications.                           - Await pathology results.                           - Repeat colonoscopy in 6-9 months for                            surveillance. Recommend a Colowrap or Abdominal                            binder.                           - Consider Genetics referral due to number of                            polyps.                           - No NSAIDs for next 2-3 weeks to decrease risk of                            bleeding post-intervention.                           - The findings and recommendations were discussed                            with the patient.                           - The findings and recommendations were discussed                            with the patient's family. Procedure Code(s):        --- Professional ---                           [phone], Colonoscopy, flexible; with endoscopic                            mucosal resection                           45385, 88, Colonoscopy, flexible; with removal of                            tumor(s),  polyp(s), or other lesion(s) by snare  technique Diagnosis Code(s):        --- Professional ---                           K64.1, Second degree hemorrhoids                           K63.5, Polyp of colon CPT copyright 2019 [employer]. All rights reserved. The codes documented in this report are preliminary and upon coder review may  be revised to meet current compliance requirements. Justice Britain, MD 04/30/2021 12:15:46 PM Number of Addenda: 0

## 2021-04-30 NOTE — Discharge Instructions (Signed)
YOU HAD AN ENDOSCOPIC PROCEDURE TODAY: Refer to the procedure report and other information in the discharge instructions given to you for any specific questions about what was found during the examination. If this information does not answer your questions, please call [facility] office at [phone] to clarify.  ° °YOU SHOULD EXPECT: Some feelings of bloating in the abdomen. Passage of more gas than usual. Walking can help get rid of the air that was put into your GI tract during the procedure and reduce the bloating. If you had a lower endoscopy (such as a colonoscopy or flexible sigmoidoscopy) you may notice spotting of blood in your stool or on the toilet paper. Some abdominal soreness may be present for a day or two, also. ° °DIET: Your first meal following the procedure should be a light meal and then it is ok to progress to your normal diet. A half-sandwich or bowl of soup is an example of a good first meal. Heavy or fried foods are harder to digest and may make you feel nauseous or bloated. Drink plenty of fluids but you should avoid alcoholic beverages for 24 hours. If you had a esophageal dilation, please see attached instructions for diet.   ° °ACTIVITY: Your care partner should take you home directly after the procedure. You should plan to take it easy, moving slowly for the rest of the day. You can resume normal activity the day after the procedure however YOU SHOULD NOT DRIVE, use power tools, machinery or perform tasks that involve climbing or major physical exertion for 24 hours (because of the sedation medicines used during the test).  ° °SYMPTOMS TO REPORT IMMEDIATELY: °A gastroenterologist can be reached at any hour. Please call [phone]  for any of the following symptoms:  °Following lower endoscopy (colonoscopy, flexible sigmoidoscopy) °Excessive amounts of blood in the stool  °Significant tenderness, worsening of abdominal pains  °Swelling of the abdomen that is new, acute  °Fever of 100° or  higher  °Following upper endoscopy (EGD, EUS, ERCP, esophageal dilation) °Vomiting of blood or coffee ground material  °New, significant abdominal pain  °New, significant chest pain or pain under the shoulder blades  °Painful or persistently difficult swallowing  °New shortness of breath  °Black, tarry-looking or red, bloody stools ° °FOLLOW UP:  °If any biopsies were taken you will be contacted by phone or by letter within the next 1-3 weeks. Call [phone]  if you have not heard about the biopsies in 3 weeks.  °Please also call with any specific questions about appointments or follow up tests. ° °

## 2021-04-30 NOTE — Anesthesia Preprocedure Evaluation (Signed)
Anesthesia Evaluation  Patient identified by MRN, date of birth, ID band Patient awake    Reviewed: Allergy & Precautions, NPO status , Patient's Chart, lab work & pertinent test results  Airway Mallampati: II  TM Distance: >3 FB Neck ROM: Full    Dental no notable dental hx.    Pulmonary asthma , COPD,  COPD inhaler, former smoker,    Pulmonary exam normal breath sounds clear to auscultation       Cardiovascular hypertension, + Peripheral Vascular Disease  Normal cardiovascular exam Rhythm:Regular Rate:Normal     Neuro/Psych negative neurological ROS  negative psych ROS   GI/Hepatic negative GI ROS, Neg liver ROS,   Endo/Other  negative endocrine ROS  Renal/GU negative Renal ROS  negative genitourinary   Musculoskeletal negative musculoskeletal ROS (+)   Abdominal   Peds negative pediatric ROS (+)  Hematology  (+) Blood dyscrasia, , CML   Anesthesia Other Findings   Reproductive/Obstetrics negative OB ROS                             Anesthesia Physical Anesthesia Plan  ASA: 3  Anesthesia Plan: MAC   Post-op Pain Management:    Induction: Intravenous  PONV Risk Score and Plan: 1 and Propofol infusion  Airway Management Planned: Simple Face Mask  Additional Equipment:   Intra-op Plan:   Post-operative Plan:   Informed Consent: I have reviewed the patients History and Physical, chart, labs and discussed the procedure including the risks, benefits and alternatives for the proposed anesthesia with the patient or authorized representative who has indicated his/her understanding and acceptance.     Dental advisory given  Plan Discussed with: CRNA and Surgeon  Anesthesia Plan Comments:         Anesthesia Quick Evaluation

## 2021-04-30 NOTE — Anesthesia Postprocedure Evaluation (Signed)
Anesthesia Post Note  Patient: Kyle Hong Sr.  Procedure(s) Performed: COLONOSCOPY WITH PROPOFOL ENDOSCOPIC MUCOSAL RESECTION ESOPHAGOGASTRODUODENOSCOPY (EGD) WITH PROPOFOL BIOPSY POLYPECTOMY [place] PLACEMENT     Patient location during evaluation: PACU Anesthesia Type: MAC Level of consciousness: awake and alert Pain management: pain level controlled Vital Signs Assessment: post-procedure vital signs reviewed and stable Respiratory status: spontaneous breathing, nonlabored ventilation, respiratory function stable and patient connected to nasal cannula oxygen Cardiovascular status: stable and blood pressure returned to baseline Postop Assessment: no apparent nausea or vomiting Anesthetic complications: no   No notable events documented.  Last Vitals:  Vitals:   04/30/21 0922 04/30/21 1143  BP: (!) 169/96 (!) 161/93  Pulse: 62 (!) 54  Resp: 18 16  Temp: 36.7 C   SpO2: 100% 100%    Last Pain:  Vitals:   04/30/21 1143  TempSrc: Axillary  PainSc: 0-No pain                 Naziya Hegwood S

## 2021-04-30 NOTE — H&P (Addendum)
GASTROENTEROLOGY PROCEDURE H&P NOTE   Primary Care Physician: Philmore Pali, NP  HPI: Kyle Hong Sr. is a 70 y.o. male who presents for Colonoscopy for attempt at resection of DC polyp and history of colon polyps and EGD for history of gastric ulcers/abdominal pain.  Past Medical History:  Diagnosis Date   Arthritis    on PRN meds   Asthma    uses inhaler   BPH (benign prostatic hyperplasia)    CAD (coronary artery disease)    Cataract    bilateral sx   Cholangiocarcinoma of biliary tract (HCC)    Chronic myelogenous leukemia (HCC)    COPD (chronic obstructive pulmonary disease) (HCC)    uses inhaler   DJD (degenerative joint disease)    Dyslipidemia    Family history of colonic polyps    Gout    HTN (hypertension)    on meds   Hypercholesterolemia    Lung cancer (HCC)    Myelogenous leukemia (HCC)    OA (osteoarthritis)    PAD (peripheral artery disease) (HCC)    Renal insufficiency    Tendonitis    patellar   Past Surgical History:  Procedure Laterality Date   Bowel duct surg  2014   Bowel cancer   CARDIAC CATHETERIZATION  2000   CHOLECYSTECTOMY     COLONOSCOPY  06/23/2015   mild sigmoid diverticulosis. small external and internal hemorrhoids. otherwise normal colonoscopy to terminal ileum   LUNG CANCER SURGERY  Jan. 2016   partial lobectomy   SHOULDER OPEN ROTATOR CUFF REPAIR Right 09/27/2020   Procedure: Right shoulder mini open rotator cuff repair, distal clavicle resection;  Surgeon: Susa Day, MD;  Location: [facility];  Service: Orthopedics;  Laterality: Right;  90 mins  Choice with Block anesthesia   wipple procedure     stage I- due to bile duct cancer-2014 at [facility] (Dr Vernard Gambles). Stage IA cholangiocarcinoma   Current Facility-Administered Medications  Medication Dose Route Frequency Provider Last Rate Last Admin   0.9 %  sodium chloride infusion   Intravenous Continuous Mansouraty, Telford Nab., MD       lactated ringers infusion   Intravenous  Continuous Mansouraty, Telford Nab., MD       Allergies  Allergen Reactions   Pneumococcal Vaccines Swelling   Family History  Problem Relation Age of Onset   Prostate cancer Father 55       deceased   Hypertension Mother    Hypertension Brother    Hypertension Sister    Asthma Sister    Esophageal cancer Neg Hx    Colon polyps Neg Hx    Colon cancer Neg Hx    Stomach cancer Neg Hx    Rectal cancer Neg Hx    Inflammatory bowel disease Neg Hx    Liver disease Neg Hx    Pancreatic cancer Neg Hx    Social History   Socioeconomic History   Marital status: Married    Spouse name: Not on file   Number of children: 2   Years of education: Not on file   Highest education level: Not on file  Occupational History   Occupation: environmental  Tobacco Use   Smoking status: Former    Packs/day: 0.25    Years: 40.00    Pack years: 10.00    Types: Cigarettes   Smokeless tobacco: Never   Tobacco comments:    smokes 1-2 cigarettes at night when drinking 07/02/16. Quit 4 months ago  Vaping Use   Vaping  Use: Never used  Substance and Sexual Activity   Alcohol use: Yes    Alcohol/week: 6.0 standard drinks    Types: 6 Standard drinks or equivalent per week   Drug use: Never   Sexual activity: Not on file  Other Topics Concern   Not on file  Social History Narrative   Married, 2 children.   Works moves heavy equipment x 30 yrs.   Social Determinants of Health   Financial Resource Strain: Not on file  Food Insecurity: Not on file  Transportation Needs: Not on file  Physical Activity: Not on file  Stress: Not on file  Social Connections: Not on file  Intimate Partner Violence: Not on file    Physical Exam: Vital signs in last 24 hours: Temp:  [98 F (36.7 C)] 98 F (36.7 C) (08/08 0922) Pulse Rate:  [62] 62 (08/08 0922) Resp:  [18] 18 (08/08 0922) BP: (169)/(96) 169/96 (08/08 0922) SpO2:  [100 %] 100 % (08/08 0922)   GEN: NAD EYE: Sclerae anicteric ENT: MMM CV:  Non-tachycardic GI: Soft, TTP in abdomen 4-5/10 currently NEURO:  Alert & Oriented x 3  Lab Results: No results for input(s): WBC, HGB, HCT, PLT in the last 72 hours. BMET No results for input(s): NA, K, CL, CO2, GLUCOSE, BUN, CREATININE, CALCIUM in the last 72 hours. LFT No results for input(s): PROT, ALBUMIN, AST, ALT, ALKPHOS, BILITOT, BILIDIR, IBILI in the last 72 hours. PT/INR No results for input(s): LABPROT, INR in the last 72 hours.   Impression / Plan: This is a 70 y.o.male who presents for Colonoscopy for attempt at resection of DC polyp and history of colon polyps and EGD for history of gastric ulcers/abdominal pain.  The risks and benefits of endoscopic evaluation were discussed with the patient; these include but are not limited to the risk of perforation, infection, bleeding, missed lesions, lack of diagnosis, severe illness requiring hospitalization, as well as anesthesia and sedation related illnesses.  The patient is agreeable to proceed.    Justice Britain, MD [facility] Office # [phone]

## 2021-04-30 NOTE — Op Note (Signed)
[facility] Patient Name: Kyle Montoya Procedure Date: 04/30/2021 MRN: [id number] Attending MD: Justice Britain , MD Date of Birth: [birth year] CSN: [id number] Age: 70 Admit Type: Outpatient Procedure:                Upper GI endoscopy Indications:              Generalized abdominal pain, , Follow-up of                            gastrojejunal ulcer, History of Whipple for                            Cholangiocarcinoma Providers:                Justice Britain, MD, Carlyn Reichert, RN, Elspeth Cho Tech., Technician, [facility],                            CRNA Referring MD:             [name], MD, Rudi Rummage. Lam Medicines:                Monitored Anesthesia Care Complications:            No immediate complications. Estimated Blood Loss:     Estimated blood loss was minimal. Procedure:                Pre-Anesthesia Assessment:                           - Prior to the procedure, a History and Physical                            was performed, and patient medications and                            allergies were reviewed. The patient's tolerance of                            previous anesthesia was also reviewed. The risks                            and benefits of the procedure and the sedation                            options and risks were discussed with the patient.                            All questions were answered, and informed consent                            was obtained. Prior Anticoagulants: The patient has                            taken  no previous anticoagulant or antiplatelet                            agents. ASA Grade Assessment: III - A patient with                            severe systemic disease. After reviewing the risks                            and benefits, the patient was deemed in                            satisfactory condition to undergo the procedure.                           After  obtaining informed consent, the endoscope was                            passed under direct vision. Throughout the                            procedure, the patient's blood pressure, pulse, and                            oxygen saturations were monitored continuously. The                            GIF-H190 ([id number]) Olympus endoscope was introduced                            through the mouth, and advanced to the jejunum. The                            upper GI endoscopy was accomplished without                            difficulty. The patient tolerated the procedure. Scope In: Scope Out: Findings:      No gross lesions were noted in the entire esophagus.      The distal esophagus was moderately tortuous.      Diffuse moderate inflammation characterized by erythema, friability and       granularity was found in the entire examined stomach. Biopsies were       taken with a cold forceps for histology and Helicobacter pylori testing.      There was evidence of a patent duodenoenterostomy in the duodenal bulb.       This was characterized by healthy appearing mucosa.      Few non-bleeding cratered ulcers with a clean ulcer base (Forrest Class       III) were found in the presumed efferent jejunum but difficult to know       as unable to find choledochoduodenostomy or choledochojejunostomy. The       largest lesion was 10 mm in largest dimension.      Normal mucosa was found in the rest of the examined jejunum. Biopsies  were taken with a cold forceps for histology to rule out enteropathy. Impression:               - No gross lesions in esophagus. Tortuous esophagus.                           - Gastritis. Biopsied.                           - Patent duodenoenterostomy, characterized by                            healthy appearing mucosa was found in the bulb.                           - Non-bleeding jejunal ulcers with a clean ulcer                            base (Forrest Class III)  in presumed efferent limb                            (though unclear as unable to visualize                            choledochoduodenostomy/choledochojejunostomy)..                           - Normal mucosa was found in the afferent and                            efferent jejunum otherwise. Biopsied. Moderate Sedation:      Not Applicable - Patient had care per Anesthesia. Recommendation:           - Proceed to scheduled colonoscopy.                           - Start Omeprazole 40 mg twice daily.                           - Consider Carafate in future.                           - Complete tobacco cessation is recommended.                           - Consider repeat EGD in 9-months.                           - The findings and recommendations were discussed                            with the patient.                           - The findings and recommendations were discussed  with the patient's family. Procedure Code(s):        --- Professional ---                           [id number], Esophagogastroduodenoscopy, flexible,                            transoral; with biopsy, single or multiple Diagnosis Code(s):        --- Professional ---                           Q39.9, Congenital malformation of esophagus,                            unspecified                           K29.70, Gastritis, unspecified, without bleeding                           Z98.0, Intestinal bypass and anastomosis status                           K28.9, Gastrojejunal ulcer, unspecified as acute or                            chronic, without hemorrhage or perforation                           R10.84, Generalized abdominal pain CPT copyright 2019 American Medical Association. All rights reserved. The codes documented in this report are preliminary and upon coder review may  be revised to meet current compliance requirements. Justice Britain, MD 04/30/2021 12:03:32 PM Number of Addenda: 0

## 2021-04-30 NOTE — Transfer of Care (Signed)
Immediate Anesthesia Transfer of Care Note  Patient: Kyle Hong Sr.  Procedure(s) Performed: COLONOSCOPY WITH PROPOFOL ENDOSCOPIC MUCOSAL RESECTION ESOPHAGOGASTRODUODENOSCOPY (EGD) WITH PROPOFOL BIOPSY POLYPECTOMY SUBMUCOSAL LIFTING INJECTION HEMOSTASIS CLIP PLACEMENT  Patient Location: PACU  Anesthesia Type:MAC  Level of Consciousness: awake, alert  and oriented  Airway & Oxygen Therapy: Patient Spontanous Breathing and Patient connected to face mask oxygen  Post-op Assessment: Report given to RN and Post -op Vital signs reviewed and stable  Post vital signs: Reviewed and stable  Last Vitals:  Vitals Value Taken Time  BP    Temp    Pulse 53 04/30/21 1144  Resp 17 04/30/21 1144  SpO2 100 % 04/30/21 1144  Vitals shown include unvalidated device data.  Last Pain:  Vitals:   04/30/21 0922  TempSrc: Oral  PainSc: 0-No pain         Complications: No notable events documented.

## 2021-05-01 ENCOUNTER — Encounter: Payer: Self-pay | Admitting: Gastroenterology

## 2021-05-01 LAB — SURGICAL PATHOLOGY

## 2021-05-02 ENCOUNTER — Encounter (HOSPITAL_COMMUNITY): Payer: Self-pay | Admitting: Gastroenterology

## 2021-05-21 DIAGNOSIS — M47816 Spondylosis without myelopathy or radiculopathy, lumbar region: Secondary | ICD-10-CM | POA: Diagnosis not present

## 2021-05-21 DIAGNOSIS — M47812 Spondylosis without myelopathy or radiculopathy, cervical region: Secondary | ICD-10-CM | POA: Diagnosis not present

## 2021-05-21 DIAGNOSIS — G894 Chronic pain syndrome: Secondary | ICD-10-CM | POA: Diagnosis not present

## 2021-05-21 DIAGNOSIS — M150 Primary generalized (osteo)arthritis: Secondary | ICD-10-CM | POA: Diagnosis not present

## 2021-05-29 DIAGNOSIS — D519 Vitamin B12 deficiency anemia, unspecified: Secondary | ICD-10-CM | POA: Diagnosis not present

## 2021-06-15 ENCOUNTER — Other Ambulatory Visit: Payer: Self-pay | Admitting: Oncology

## 2021-06-27 ENCOUNTER — Telehealth: Payer: Self-pay | Admitting: Oncology

## 2021-06-27 NOTE — Telephone Encounter (Signed)
Patient call to see if he can pick up Contrast for 10/7 CT Scans today.  I informed him he can

## 2021-06-29 ENCOUNTER — Encounter: Payer: Self-pay | Admitting: Oncology

## 2021-06-29 DIAGNOSIS — J90 Pleural effusion, not elsewhere classified: Secondary | ICD-10-CM | POA: Diagnosis not present

## 2021-06-29 DIAGNOSIS — Z85118 Personal history of other malignant neoplasm of bronchus and lung: Secondary | ICD-10-CM | POA: Diagnosis not present

## 2021-06-29 DIAGNOSIS — C3412 Malignant neoplasm of upper lobe, left bronchus or lung: Secondary | ICD-10-CM | POA: Diagnosis not present

## 2021-06-29 DIAGNOSIS — C9210 Chronic myeloid leukemia, BCR/ABL-positive, not having achieved remission: Secondary | ICD-10-CM | POA: Diagnosis not present

## 2021-06-29 DIAGNOSIS — I700 Atherosclerosis of aorta: Secondary | ICD-10-CM | POA: Diagnosis not present

## 2021-06-29 DIAGNOSIS — D3500 Benign neoplasm of unspecified adrenal gland: Secondary | ICD-10-CM | POA: Diagnosis not present

## 2021-06-29 DIAGNOSIS — I2510 Atherosclerotic heart disease of native coronary artery without angina pectoris: Secondary | ICD-10-CM | POA: Diagnosis not present

## 2021-06-29 DIAGNOSIS — I719 Aortic aneurysm of unspecified site, without rupture: Secondary | ICD-10-CM | POA: Diagnosis not present

## 2021-06-29 DIAGNOSIS — I3139 Other pericardial effusion (noninflammatory): Secondary | ICD-10-CM | POA: Diagnosis not present

## 2021-06-29 DIAGNOSIS — D3502 Benign neoplasm of left adrenal gland: Secondary | ICD-10-CM | POA: Diagnosis not present

## 2021-06-29 DIAGNOSIS — C249 Malignant neoplasm of biliary tract, unspecified: Secondary | ICD-10-CM | POA: Diagnosis not present

## 2021-06-29 DIAGNOSIS — Z902 Acquired absence of lung [part of]: Secondary | ICD-10-CM | POA: Diagnosis not present

## 2021-07-03 ENCOUNTER — Ambulatory Visit: Payer: Medicare Other | Admitting: Oncology

## 2021-07-03 ENCOUNTER — Telehealth: Payer: Self-pay | Admitting: Oncology

## 2021-07-03 DIAGNOSIS — C9210 Chronic myeloid leukemia, BCR/ABL-positive, not having achieved remission: Secondary | ICD-10-CM

## 2021-07-03 DIAGNOSIS — C3492 Malignant neoplasm of unspecified part of left bronchus or lung: Secondary | ICD-10-CM

## 2021-07-03 DIAGNOSIS — C249 Malignant neoplasm of biliary tract, unspecified: Secondary | ICD-10-CM

## 2021-07-03 NOTE — Telephone Encounter (Signed)
Patient called to verify today's Appt - He had to go out-of-town, but it's past his Appt.  Patient rescheduled to 10/17 Follow Up 2:00 pm

## 2021-07-06 DIAGNOSIS — D519 Vitamin B12 deficiency anemia, unspecified: Secondary | ICD-10-CM | POA: Diagnosis not present

## 2021-07-09 ENCOUNTER — Telehealth: Payer: Self-pay

## 2021-07-09 ENCOUNTER — Ambulatory Visit: Payer: Medicare Other | Admitting: Oncology

## 2021-07-09 NOTE — Telephone Encounter (Signed)
-----   Message from [name], MD sent at 07/09/2021  2:35 PM EDT ----- Regarding: RE: CT  ----- Message ----- From: [name], MD Sent: 07/09/2021   7:26 AM EDT To: [name], MD Subject: CT                                             Did he get CT scans and labs?

## 2021-07-09 NOTE — Telephone Encounter (Signed)
Printed labs and CT scan and gave to Dr. Hinton Rao.

## 2021-07-12 ENCOUNTER — Ambulatory Visit: Payer: Medicare Other | Admitting: Oncology

## 2021-07-16 DIAGNOSIS — M47816 Spondylosis without myelopathy or radiculopathy, lumbar region: Secondary | ICD-10-CM | POA: Diagnosis not present

## 2021-07-16 DIAGNOSIS — M47812 Spondylosis without myelopathy or radiculopathy, cervical region: Secondary | ICD-10-CM | POA: Diagnosis not present

## 2021-07-16 DIAGNOSIS — M150 Primary generalized (osteo)arthritis: Secondary | ICD-10-CM | POA: Diagnosis not present

## 2021-07-16 DIAGNOSIS — G894 Chronic pain syndrome: Secondary | ICD-10-CM | POA: Diagnosis not present

## 2021-07-24 DIAGNOSIS — J45909 Unspecified asthma, uncomplicated: Secondary | ICD-10-CM | POA: Diagnosis not present

## 2021-07-24 DIAGNOSIS — J449 Chronic obstructive pulmonary disease, unspecified: Secondary | ICD-10-CM | POA: Diagnosis not present

## 2021-07-24 DIAGNOSIS — J301 Allergic rhinitis due to pollen: Secondary | ICD-10-CM | POA: Diagnosis not present

## 2021-07-24 DIAGNOSIS — E785 Hyperlipidemia, unspecified: Secondary | ICD-10-CM | POA: Diagnosis not present

## 2021-07-24 DIAGNOSIS — D519 Vitamin B12 deficiency anemia, unspecified: Secondary | ICD-10-CM | POA: Diagnosis not present

## 2021-07-24 DIAGNOSIS — M109 Gout, unspecified: Secondary | ICD-10-CM | POA: Diagnosis not present

## 2021-07-24 DIAGNOSIS — Z79899 Other long term (current) drug therapy: Secondary | ICD-10-CM | POA: Diagnosis not present

## 2021-07-24 DIAGNOSIS — G2581 Restless legs syndrome: Secondary | ICD-10-CM | POA: Diagnosis not present

## 2021-07-24 DIAGNOSIS — I10 Essential (primary) hypertension: Secondary | ICD-10-CM | POA: Diagnosis not present

## 2021-07-24 DIAGNOSIS — I729 Aneurysm of unspecified site: Secondary | ICD-10-CM | POA: Diagnosis not present

## 2021-08-06 DIAGNOSIS — D519 Vitamin B12 deficiency anemia, unspecified: Secondary | ICD-10-CM | POA: Diagnosis not present

## 2021-08-10 DIAGNOSIS — D485 Neoplasm of uncertain behavior of skin: Secondary | ICD-10-CM | POA: Diagnosis not present

## 2021-08-10 DIAGNOSIS — L578 Other skin changes due to chronic exposure to nonionizing radiation: Secondary | ICD-10-CM | POA: Diagnosis not present

## 2021-08-14 DIAGNOSIS — J301 Allergic rhinitis due to pollen: Secondary | ICD-10-CM | POA: Diagnosis not present

## 2021-08-14 DIAGNOSIS — I729 Aneurysm of unspecified site: Secondary | ICD-10-CM | POA: Diagnosis not present

## 2021-08-14 DIAGNOSIS — J449 Chronic obstructive pulmonary disease, unspecified: Secondary | ICD-10-CM | POA: Diagnosis not present

## 2021-08-14 DIAGNOSIS — G2581 Restless legs syndrome: Secondary | ICD-10-CM | POA: Diagnosis not present

## 2021-08-24 ENCOUNTER — Other Ambulatory Visit: Payer: Self-pay | Admitting: Hematology and Oncology

## 2021-08-24 DIAGNOSIS — C9210 Chronic myeloid leukemia, BCR/ABL-positive, not having achieved remission: Secondary | ICD-10-CM

## 2021-08-24 DIAGNOSIS — R0600 Dyspnea, unspecified: Secondary | ICD-10-CM | POA: Diagnosis not present

## 2021-08-27 ENCOUNTER — Other Ambulatory Visit: Payer: Self-pay

## 2021-08-27 ENCOUNTER — Other Ambulatory Visit: Payer: Self-pay | Admitting: Hematology and Oncology

## 2021-08-27 ENCOUNTER — Inpatient Hospital Stay: Payer: BC Managed Care – PPO

## 2021-08-27 ENCOUNTER — Telehealth: Payer: Self-pay | Admitting: Hematology and Oncology

## 2021-08-27 ENCOUNTER — Inpatient Hospital Stay: Payer: BC Managed Care – PPO | Attending: Hematology and Oncology | Admitting: Hematology and Oncology

## 2021-08-27 ENCOUNTER — Encounter: Payer: Self-pay | Admitting: Hematology and Oncology

## 2021-08-27 VITALS — BP 184/93 | HR 77 | Temp 98.3°F | Resp 18 | Ht 70.0 in | Wt 225.6 lb

## 2021-08-27 DIAGNOSIS — C249 Malignant neoplasm of biliary tract, unspecified: Secondary | ICD-10-CM

## 2021-08-27 DIAGNOSIS — C9210 Chronic myeloid leukemia, BCR/ABL-positive, not having achieved remission: Secondary | ICD-10-CM

## 2021-08-27 DIAGNOSIS — C3492 Malignant neoplasm of unspecified part of left bronchus or lung: Secondary | ICD-10-CM | POA: Diagnosis not present

## 2021-08-27 DIAGNOSIS — C3490 Malignant neoplasm of unspecified part of unspecified bronchus or lung: Secondary | ICD-10-CM | POA: Diagnosis not present

## 2021-08-27 HISTORY — DX: Hypocalcemia: E83.51

## 2021-08-27 LAB — BASIC METABOLIC PANEL
BUN: 11 (ref 4–21)
CO2: 24 — AB (ref 13–22)
Chloride: 102 (ref 99–108)
Creatinine: 0.6 (ref 0.6–1.3)
Glucose: 107
Potassium: 4.1 (ref 3.4–5.3)
Sodium: 132 — AB (ref 137–147)

## 2021-08-27 LAB — COMPREHENSIVE METABOLIC PANEL
Albumin: 4 (ref 3.5–5.0)
Calcium: 8.3 — AB (ref 8.7–10.7)

## 2021-08-27 LAB — CBC AND DIFFERENTIAL
HCT: 40 — AB (ref 41–53)
Hemoglobin: 14.2 (ref 13.5–17.5)
Neutrophils Absolute: 2.66
Platelets: 213 (ref 150–399)
WBC: 7

## 2021-08-27 LAB — HEPATIC FUNCTION PANEL
ALT: 39 (ref 10–40)
AST: 57 — AB (ref 14–40)
Alkaline Phosphatase: 74 (ref 25–125)
Bilirubin, Total: 0.5

## 2021-08-27 LAB — CBC: RBC: 4.19 (ref 3.87–5.11)

## 2021-08-27 LAB — VITAMIN D 25 HYDROXY (VIT D DEFICIENCY, FRACTURES): Vit D, 25-Hydroxy: 50.48 ng/mL (ref 30–100)

## 2021-08-27 NOTE — Assessment & Plan Note (Addendum)
The patient continues vitamin D 2000 international units daily. Repeat vitamin D was normal.  PTH, as well as repeat calcium done with PTH were normal

## 2021-08-27 NOTE — Assessment & Plan Note (Signed)
Recent CT imaging in October did not reveal any evidence of recurrence.  We will plan to see him back in 3 months with a CA 19-9 9 and CEA.

## 2021-08-27 NOTE — Telephone Encounter (Signed)
Per 12/5 los next appt scheduled and given to patient

## 2021-08-27 NOTE — Assessment & Plan Note (Signed)
CT imaging in October did not reveal any evidence of recurrence.  We will plan to see him back in 3 months with a CBC, comprehensive metabolic panel and CEA.

## 2021-08-27 NOTE — Assessment & Plan Note (Signed)
Most recent PCR for BCR/ABL was negative.  The patient knows to continue dasatinib daily.  We will plan to see him back in 3 months with a CBC, comprehensive metabolic panel and PCR for BCR/ABL.

## 2021-08-27 NOTE — Progress Notes (Signed)
[place]  [address] [place],  [place]  [address] [phone]  Clinic Day:  09/03/2021  Referring physician: Philmore Pali, NP  ASSESSMENT & PLAN:   Assessment & Plan: CML (chronic myelocytic leukemia) ([place]) Most recent PCR for BCR/ABL was negative.  The patient knows to continue dasatinib daily.  We will plan to see him back in 3 months with a CBC, comprehensive metabolic panel and PCR for BCR/ABL.  Carcinoma of biliary tract [facility]) Recent CT imaging in October did not reveal any evidence of recurrence.  We will plan to see him back in 3 months with a CA 19-9 9 and CEA.  Non-small cell carcinoma of lung ([place]) CT imaging in October did not reveal any evidence of recurrence.  We will plan to see him back in 3 months with a CBC, comprehensive metabolic panel and CEA.  Hypocalcemia The patient continues vitamin D 2000 international units daily. Repeat vitamin D was normal.  PTH, as well as repeat calcium done with PTH were normal   The patient understands the plans discussed today and is in agreement with them.  He knows to contact our office if he develops concerns prior to his next appointment.    Marvia Pickles, PA-C  [facility] AT [facility] [address] [place] [address] Dept: [phone] Dept Fax: [phone]   Orders Placed This Encounter  Procedures   VITAMIN D 25 Hydroxy (Vit-D Deficiency, Fractures)    Standing Status:   Future    Number of Occurrences:   1    Standing Expiration Date:   08/27/2022   CBC with Differential (Cancer Center Only)    Standing Status:   Future    Standing Expiration Date:   08/27/2022   CMP (Cancer Center only)    Standing Status:   Future    Standing Expiration Date:   08/27/2022   Cancer antigen 19-9    Standing Status:   Future    Standing Expiration Date:   08/27/2022   CEA    Standing Status:   Future    Standing Expiration Date:    08/27/2022   PTH, intact and calcium    Standing Status:   Future    Number of Occurrences:   1    Standing Expiration Date:   08/27/2022   bcr/abl-PCR    Standing Status:   Future    Standing Expiration Date:   08/27/2022      CHIEF COMPLAINT:  CC: Chronic myelogenous leukemia with history of lung cancer and cholangiocarcinoma  Current Treatment: Dasatinib 100 mg daily   HISTORY OF PRESENT ILLNESS:         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[place])  02/15/2013 Initial Diagnosis   Carcinoma of biliary tract ([place])   03/06/2013 Cancer Staging   Staging form: Distal Bile Ducts, AJCC 7th Edition - Clinical stage from 03/06/2013: Stage IA (T1, N0, M0) - Signed by [name], MD on 11/09/2020 Staged by: Managing physician Diagnostic confirmation: Positive histology Specimen type: Excision Histopathologic type: Cholangiocarcinoma Stage prefix: Initial diagnosis Lymph-vascular invasion (LVI): LVI not present (absent)/not identified Residual tumor (R): R0 - None Stage used in treatment planning: Yes National guidelines used in treatment planning: Yes Type of national guideline used in treatment planning: NCCN    Non-small cell carcinoma of lung ([place])  11/08/2014 Cancer Staging   Staging form: Lung, AJCC 7th Edition - Clinical stage from 11/08/2014:  Stage IA (T1b, N0, M0) - Signed by [name], MD on 11/09/2020 Staged by: Managing physician Diagnostic confirmation: Positive histology Specimen type: Excision Histopathologic type: Adenocarcinoma, NOS Stage prefix: Initial diagnosis Laterality: Left Tumor size (mm): 30 Histologic grade (G): G1 Lymph-vascular invasion (LVI): LVI not present (absent)/not identified Residual tumor (R): R0 - None Pleural/elastic layer invasion: PL0 Prognostic indicators: Resection LUL Stage used in treatment planning: Yes National guidelines used in treatment planning: Yes Type of national guideline used in treatment  planning: NCCN    01/31/2015 Initial Diagnosis   Non-small cell carcinoma of lung ([place])      INTERVAL HISTORY:  Jasim is here today for repeat clinical assessment and states he has been doing fairly well.  He was due for follow-up in October, but was unable to keep that appointment.  He did have a CT chest, abdomen and pelvis in October that did not reveal any evidence of recurrent disease.  CEA and CA 19-9 were normal.  PCR for BCR/ABL was negative.  He has seen Dr. Alcide Clever for difficulty breathing, mainly with exertion.  The patient states he switched his long-acting inhaler to Trelegy.  He continues albuterol as needed.  He states he had another test on December 12, but does not have the result.  He sees Dr. Alcide Clever again on December 16th.  Since his last visit, he also saw a gastroenterologist for his abdominal pain, which we attributed to Dasatinib.  He was placed on omeprazole and his abdominal pain resolved.  He is no longer taking Tums, but had been previously.  He was found to have vitamin D insufficiency last year and is on vitamin D3 2000 international units daily.  He denies fevers or chills. He denies pain. His appetite is good. His weight has increased 8 pounds over last 4 months .  REVIEW OF SYSTEMS:  Review of Systems  Constitutional:  Negative for appetite change, chills, fatigue, fever and unexpected weight change.  HENT:   Negative for lump/mass, mouth sores and sore throat.   Respiratory:  Positive for shortness of breath (mainly with exertion). Negative for cough.   Cardiovascular:  Negative for chest pain and leg swelling.  Gastrointestinal:  Negative for abdominal pain, constipation, diarrhea, nausea and vomiting.  Genitourinary:  Negative for difficulty urinating, dysuria, frequency and hematuria.   Musculoskeletal:  Negative for arthralgias, back pain and myalgias.  Skin:  Negative for itching, rash and wound.  Neurological:  Negative for dizziness, extremity weakness,  headaches, light-headedness and numbness.  Hematological:  Negative for adenopathy.  Psychiatric/Behavioral:  Negative for depression and sleep disturbance. The patient is not nervous/anxious.     VITALS:  Blood pressure (!) 184/93, pulse 77, temperature 98.3 F (36.8 C), temperature source Oral, resp. rate 18, height 5\' 10"  (1.778 m), weight 225 lb 9.6 oz (102.3 kg), SpO2 97 %.  Wt Readings from Last 3 Encounters:  09/03/21 227 lb (103 kg)  08/27/21 225 lb 9.6 oz (102.3 kg)  04/30/21 217 lb (98.4 kg)    Body mass index is 32.37 kg/m.  Performance status (ECOG): 1 - Symptomatic but completely ambulatory  PHYSICAL EXAM:  Physical Exam  LABS:   CBC Latest Ref Rng & Units 08/27/2021 04/02/2021 02/12/2021  WBC - 7.0 6.8 6.0  Hemoglobin 13.5 - 17.5 14.2 13.9 13.5  Hematocrit 41 - 53 40(A) 39(A) 39(A)  Platelets 150 - 399 213 193 202   CMP Latest Ref Rng & Units 08/27/2021 08/27/2021 04/02/2021  Glucose 70 - 99 mg/dL - - -  BUN 4 - 21 11 - 12  Creatinine 0.6 - 1.3 0.6 - 0.6  Sodium 137 - 147 132(A) - 134(A)  Potassium 3.4 - 5.3 4.1 - 3.8  Chloride 99 - 108 102 - 104  CO2 13 - 22 24(A) - 23(A)  Calcium 8.6 - 10.2 mg/dL 8.7 8.3(A) 8.0(A)  Total Protein 6.5 - 8.1 g/dL - - -  Total Bilirubin 0.3 - 1.2 mg/dL - - -  Alkaline Phos 25 - 125 74 - 65  AST 14 - 40 57(A) - 42(A)  ALT 10 - 40 39 - 25     No results found for: CEA1 / No results found for: CEA1 No results found for: PSA1 No results found for: EXN170 No results found for: YFV494  No results found for: TOTALPROTELP, ALBUMINELP, A1GS, A2GS, BETS, BETA2SER, GAMS, MSPIKE, SPEI No results found for: TIBC, FERRITIN, IRONPCTSAT No results found for: LDH  STUDIES:    Exam(s): 1007-0005 CT/CT CHEST-ABD-PELV W/IV CM  CLINICAL DATA: History of left upper lobe lung cancer and CML.  Restaging.  EXAM:  CT CHEST, ABDOMEN, AND PELVIS WITH CONTRAST  TECHNIQUE:  Multidetector CT imaging of the chest, abdomen and pelvis was  performed  following the standard protocol during bolus  administration of intravenous contrast.  CONTRAST: 80 cc Isovue 370 IV.  COMPARISON: 05/08/2020 CT chest, abdomen and pelvis.  FINDINGS:  CT CHEST FINDINGS  Cardiovascular: Normal heart size. Small pericardial  effusion/thickening, unchanged. Three-vessel coronary  atherosclerosis. Minimally atherosclerotic thoracic aorta with  stable mildly dilated 4.1 cm ascending thoracic aorta. Normal  caliber main pulmonary artery. No central pulmonary emboli.  Mediastinum/Nodes: No discrete thyroid nodules. Unremarkable  esophagus. No pathologically enlarged axillary, mediastinal or hilar  lymph nodes.  Lungs/Pleura: No pneumothorax. Trace dependent left pleural  effusion, unchanged. No right pleural effusion. Status post left  upper lobectomy. No acute consolidative airspace disease, lung  masses or significant pulmonary nodules.  Musculoskeletal: No aggressive appearing focal osseous lesions. Mild  thoracic spondylosis.  CT ABDOMEN PELVIS FINDINGS  Hepatobiliary: Normal liver with no liver mass. Cholecystectomy.  Stable postsurgical changes from choledochojejunostomy with no  intrahepatic biliary ductal dilatation. Patient Name: SIDDARTH, HSIUNG SR Account # [id number]  Courtesy Copy to: Diagnostic Imaging Report   Pancreas: Stable postsurgical changes from pancreatic head resection  with pancreaticojejunostomy. No mass or significant duct dilation in  the remnant pancreas.  Spleen: Normal size. No mass.  Adrenals/Urinary Tract: Normal right adrenal. Left adrenal 1.4 cm  nodule with density 74 HU, stable using similar measurement  technique, most compatible with a benign adenoma. Chronic  malrotation of the right kidney. Otherwise normal kidneys, with no  hydronephrosis and no renal masses. Normal bladder.  Stomach/Bowel: Stable postsurgical changes from distal gastrectomy  with gastrojejunostomy. No acute gastric abnormality. No  dilated or  thick-walled small bowel loops. Oral contrast transits to the colon.  Normal appendix. Normal large bowel with no diverticulosis, large  bowel wall thickening or pericolonic fat stranding.  Vascular/Lymphatic: Atherosclerotic nonaneurysmal abdominal aorta.  Patent portal, splenic, hepatic and renal veins. No pathologically  enlarged lymph nodes in the abdomen or pelvis.  Reproductive: Normal size prostate.  Other: No pneumoperitoneum, ascites or focal fluid collection.  Musculoskeletal: No aggressive appearing focal osseous lesions.  Moderate lumbar spondylosis.  IMPRESSION:  1. No evidence of local tumor recurrence status post left upper  lobectomy.  2. No evidence of metastatic disease in the chest, abdomen or  pelvis.  3. Stable trace dependent left pleural effusion.  4. Stable small pericardial effusion/thickening.  5. Three-vessel coronary atherosclerosis.  6. Stable mildly dilated 4.1 cm ascending thoracic aorta. Recommend  annual imaging followup by CTA or MRA. This recommendation follows  2010 ACCF/AHA/AATS/ACR/ASA/SCA/SCAI/SIR/STS/SVM Guidelines for the  Diagnosis and Management of Patients with Thoracic Aortic Disease.  Circulation. 2010; 121: B284-X324. Aortic aneurysm NOS  (ICD10-I71.9).  7. Stable left adrenal adenoma.  8. Aortic Atherosclerosis (ICD10-I70.0).     HISTORY:   Past Medical History:  Diagnosis Date   Arthritis    on PRN meds   Asthma    uses inhaler   BPH (benign prostatic hyperplasia)    CAD (coronary artery disease)    Cataract    bilateral sx   Cholangiocarcinoma of biliary tract (H[facility])    Chronic myelogenous leukemia (HCC)    COPD (chronic obstructive pulmonary disease) (HCC)    uses inhaler   DJD (degenerative joint disease)    Dyslipidemia    Family history of colonic polyps    Gout    HTN (hypertension)    on meds   Hypercholesterolemia    Lung cancer (HCC)    Myelogenous leukemia (HCC)    OA (osteoarthritis)    PAD  (peripheral artery disease) (HCC)    Renal insufficiency    Tendonitis    patellar    Past Surgical History:  Procedure Laterality Date   BIOPSY  04/30/2021   Procedure: BIOPSY;  Surgeon: Rush Landmark, Telford Nab., MD;  Location: [facility];  Service: Gastroenterology;;   Bowel duct surg  2014   Bowel cancer   CARDIAC CATHETERIZATION  2000   CHOLECYSTECTOMY     COLONOSCOPY  06/23/2015   mild sigmoid diverticulosis. small external and internal hemorrhoids. otherwise normal colonoscopy to terminal ileum   COLONOSCOPY WITH PROPOFOL N/A 04/30/2021   Procedure: COLONOSCOPY WITH PROPOFOL;  Surgeon: Rush Landmark Telford Nab., MD;  Location: [facility];  Service: Gastroenterology;  Laterality: N/A;   ENDOSCOPIC MUCOSAL RESECTION N/A 04/30/2021   Procedure: ENDOSCOPIC MUCOSAL RESECTION;  Surgeon: Rush Landmark Telford Nab., MD;  Location: [facility];  Service: Gastroenterology;  Laterality: N/A;   ESOPHAGOGASTRODUODENOSCOPY (EGD) WITH PROPOFOL N/A 04/30/2021   Procedure: ESOPHAGOGASTRODUODENOSCOPY (EGD) WITH PROPOFOL;  Surgeon: Rush Landmark Telford Nab., MD;  Location: [facility];  Service: Gastroenterology;  Laterality: N/A;   HEMOSTASIS CLIP PLACEMENT  04/30/2021   Procedure: HEMOSTASIS CLIP PLACEMENT;  Surgeon: Irving Copas., MD;  Location: [facility];  Service: Gastroenterology;;   LUNG CANCER SURGERY  Jan. 2016   partial lobectomy   POLYPECTOMY  04/30/2021   Procedure: POLYPECTOMY;  Surgeon: Mansouraty, Telford Nab., MD;  Location: [facility];  Service: Gastroenterology;;   SHOULDER OPEN ROTATOR CUFF REPAIR Right 09/27/2020   Procedure: Right shoulder mini open rotator cuff repair, distal clavicle resection;  Surgeon: Susa Day, MD;  Location: [facility];  Service: Orthopedics;  Laterality: Right;  90 mins  Choice with Block anesthesia   SUBMUCOSAL LIFTING INJECTION  04/30/2021   Procedure: SUBMUCOSAL LIFTING INJECTION;  Surgeon: Irving Copas., MD;  Location: [facility];   Service: Gastroenterology;;   wipple procedure     stage I- due to bile duct cancer-2014 at [facility] (Dr Vernard Gambles). Stage IA cholangiocarcinoma    Family History  Problem Relation Age of Onset   Prostate cancer Father 82       deceased   Hypertension Mother    Hypertension Brother    Hypertension Sister    Asthma Sister    Esophageal cancer Neg Hx    Colon polyps Neg Hx  Colon cancer Neg Hx    Stomach cancer Neg Hx    Rectal cancer Neg Hx    Inflammatory bowel disease Neg Hx    Liver disease Neg Hx    Pancreatic cancer Neg Hx     Social History:  reports that he has quit smoking. His smoking use included cigarettes. He has a 10.00 pack-year smoking history. He has never used smokeless tobacco. He reports current alcohol use of about 6.0 standard drinks per week. He reports that he does not use drugs.The patient is alone today.  Allergies:  Allergies  Allergen Reactions   Pneumococcal Vaccines Swelling    Current Medications: Current Outpatient Medications  Medication Sig Dispense Refill   albuterol (PROVENTIL HFA;VENTOLIN HFA) 108 (90 BASE) MCG/ACT inhaler Inhale 2 puffs into the lungs every 6 (six) hours as needed for wheezing or shortness of breath.     allopurinol (ZYLOPRIM) 300 MG tablet Take 300 mg by mouth daily.     Cholecalciferol (VITAMIN D) 50 MCG (2000 UT) tablet Take 2,000 Units by mouth daily.     cyclobenzaprine (FLEXERIL) 5 MG tablet Take 5 mg by mouth at bedtime as needed for muscle spasms.     diclofenac Sodium (VOLTAREN) 1 % GEL Apply 2 g topically at bedtime.     docusate sodium (COLACE) 100 MG capsule Take 200 mg by mouth daily.     fluticasone (FLONASE) 50 MCG/ACT nasal spray Place 2 sprays into both nostrils daily.     HYDROcodone-acetaminophen (NORCO) 7.5-325 MG tablet Take 1 tablet by mouth 2 (two) times daily.     irbesartan-hydrochlorothiazide (AVALIDE) 150-12.5 MG tablet Take 1 tablet by mouth daily.     methocarbamol (ROBAXIN) 500 MG tablet Take 500 mg  by mouth daily.     omeprazole (PRILOSEC) 40 MG capsule Take 1 capsule (40 mg total) by mouth daily. 60 capsule 4   SPRYCEL 100 MG tablet TAKE 1 TABLET BY MOUTH  DAILY 30 tablet 5   tamsulosin (FLOMAX) 0.4 MG CAPS capsule Take 0.8 mg by mouth daily.     TRELEGY ELLIPTA 100-62.5-25 MCG/INH AEPB Inhale 1 puff into the lungs daily at 6 (six) AM.     No current facility-administered medications for this visit.

## 2021-08-29 LAB — PTH, INTACT AND CALCIUM
Calcium, Total (PTH): 8.7 mg/dL (ref 8.6–10.2)
PTH: 20 pg/mL (ref 15–65)

## 2021-09-03 ENCOUNTER — Encounter: Payer: Self-pay | Admitting: Gastroenterology

## 2021-09-03 ENCOUNTER — Ambulatory Visit (INDEPENDENT_AMBULATORY_CARE_PROVIDER_SITE_OTHER): Payer: BC Managed Care – PPO | Admitting: Gastroenterology

## 2021-09-03 ENCOUNTER — Telehealth: Payer: Self-pay | Admitting: Licensed Clinical Social Worker

## 2021-09-03 ENCOUNTER — Other Ambulatory Visit: Payer: Self-pay

## 2021-09-03 VITALS — BP 158/80 | HR 70 | Ht 70.0 in | Wt 227.0 lb

## 2021-09-03 DIAGNOSIS — K289 Gastrojejunal ulcer, unspecified as acute or chronic, without hemorrhage or perforation: Secondary | ICD-10-CM | POA: Diagnosis not present

## 2021-09-03 DIAGNOSIS — Z86010 Personal history of colonic polyps: Secondary | ICD-10-CM

## 2021-09-03 DIAGNOSIS — Z8509 Personal history of malignant neoplasm of other digestive organs: Secondary | ICD-10-CM | POA: Diagnosis not present

## 2021-09-03 DIAGNOSIS — D124 Benign neoplasm of descending colon: Secondary | ICD-10-CM

## 2021-09-03 DIAGNOSIS — E538 Deficiency of other specified B group vitamins: Secondary | ICD-10-CM

## 2021-09-03 NOTE — Patient Instructions (Addendum)
If you are age 71 or older, your body mass index should be between 23-30. Your Body mass index is 32.57 kg/m. If this is out of the aforementioned range listed, please consider follow up with your Primary Care Provider.  If you are age 86 or younger, your body mass index should be between 19-25. Your Body mass index is 32.57 kg/m. If this is out of the aformentioned range listed, please consider follow up with your Primary Care Provider.   ________________________________________________________  The [facility] providers would like to encourage you to use [facility] to communicate with providers for non-urgent requests or questions.  Due to long hold times on the telephone, sending your provider a message by [facility] may be a faster and more efficient way to get a response.  Please allow 48 business hours for a response.  Please remember that this is for non-urgent requests.  _______________________________________________________  Continue omeprazole  It has been recommended to you by your physician that you have a(n) EGD/Colon completed. Per your request, we did not schedule the procedure(s) today. Please contact our office at [phone] should you decide to have the procedure completed. You will be scheduled for a pre-visit and procedure at that time. Please all in January to see about scheduling in March or April. You will need a 2 day prep  Referral placed as well.  Thank you,  Dr. [name]

## 2021-09-03 NOTE — Telephone Encounter (Signed)
Scheduled appt per 12/12 referral. Pt is aware of appt date and time. Per pt's request I also mailed letting with appt info to pt's address.

## 2021-09-03 NOTE — Progress Notes (Signed)
Chief Complaint: FU  Referring Provider:  Philmore Pali, NP      ASSESSMENT AND PLAN;   #1. Multiple advanced colon polyps as below  #2. Marginal ulcers. Neg HP  #3. H/O cholangiocarcinoma (stage 1A) S/P [facility] at [facility] 2014 (Dr Vernard Gambles).  No recurrence on CT chest/abdomen/pelvis at [facility] Nov 2019. Nl LFTs including TB as below.  #4.  Comorbid conditions include CML in remission, H/O Stage 1A non-small cell lung CA s/p LUL lung resection 10/2014, chronic back pain requiring narcotics. COPD with continued smoking, CAD, HTN, HLD  #5.  B12 deficiency on B12 supplements.  Plan: -EGD/colon with 2 day prep for further evaluation March or April 2023 -Genetic referral for possible ?attenuated FAP in addition to several other malignancies ([email or web address]) -Continue omeprazole 40mg  po qd.   I discussed EGD/Colonoscopy- the indications, risks, alternatives and potential complications including, but not limited to, bleeding, infection, reaction to medication, damage to internal organs, cardiac and/or pulmonary problems, and perforation requiring surgery (1 to 2 in 1000). The possibility that significant findings could be missed was explained. All ? were answered. The patient gives consent to proceed.  HPI:    Kyle PANICO Sr. is a 70 y.o. male   For follow-up visit. Doing much better from GI standpoint Denies having any further epigastric pain ever since he has been on omeprazole.  Followed closely by Dr. Alta Corning.  Had negative CT chest Abdo/pelvis in October 2022 for any recurrence.  Plan is to check CA 19-9, CEA level in 12 weeks.  Initially thought his dad had colon cancer.  Now it is clear, he had prostate cancer.  No melena or hematochezia.  No jaundice dark urine or pale stools.  No fever or chills.     Previous GI work-up:  Colon 04/30/2021 Dr. Rush Landmark - Hemorrhoids found on digital rectal exam. - There was significant looping of the colon. - Stool  in the entire examined colon - lavaged with adequate visualization. - 13, 2 to 10 mm polyps in the sigmoid colon, in the descending colon, in the transverse colon, in the ascending colon and in the cecum, removed with a cold snare. Resected and retrieved. Bx- TAs - One 35 mm polyp in the descending colon, removed with mucosal resection. Resected and retrieved. Treated with a hot snare. Clips (MR conditional) were placed. Bx- TAs - Normal mucosa in the entire examined colon. - Non-bleeding non-thrombosed external and internal hemorrhoids.  EGD 04/30/2021 Dr. Rush Landmark - No gross lesions in esophagus. Tortuous esophagus. - Gastritis. Biopsied. - Patent duodenoenterostomy, characterized by healthy appearing mucosa was found in the bulb. - Non-bleeding jejunal ulcers with a clean ulcer base (Forrest Class III) in presumed efferent limb (though unclear as unable to visualize choledochoduodenostomy/choledochojejunostomy).. - Normal mucosa was found in the afferent and efferent jejunum otherwise. Biopsied.  Colon 01/25/2021 -Multiple colonic polyps (22) s/p polypectomy. Bx- TAs -One 20 mm flat sessile polyp in the proximal descending colon. Biopsied. Tattooed. -Mild pancolonic diverticulosis -Non-bleeding internal hemorrhoids. -The examination was otherwise normal on direct and retroflexion views. Past Medical History:  Diagnosis Date   Arthritis    on PRN meds   Asthma    uses inhaler   BPH (benign prostatic hyperplasia)    CAD (coronary artery disease)    Cataract    bilateral sx   Cholangiocarcinoma of biliary tract (HCC)    Chronic myelogenous leukemia (HCC)    COPD (chronic obstructive pulmonary disease) ([place])    uses inhaler  DJD (degenerative joint disease)    Dyslipidemia    Family history of colonic polyps    Gout    HTN (hypertension)    on meds   Hypercholesterolemia    Lung cancer (HCC)    Myelogenous leukemia (HCC)    OA (osteoarthritis)    PAD (peripheral artery  disease) (HCC)    Renal insufficiency    Tendonitis    patellar    Past Surgical History:  Procedure Laterality Date   BIOPSY  04/30/2021   Procedure: BIOPSY;  Surgeon: Rush Landmark, Telford Nab., MD;  Location: [facility];  Service: Gastroenterology;;   Bowel duct surg  2014   Bowel cancer   CARDIAC CATHETERIZATION  2000   CHOLECYSTECTOMY     COLONOSCOPY  06/23/2015   mild sigmoid diverticulosis. small external and internal hemorrhoids. otherwise normal colonoscopy to terminal ileum   COLONOSCOPY WITH PROPOFOL N/A 04/30/2021   Procedure: COLONOSCOPY WITH PROPOFOL;  Surgeon: Rush Landmark Telford Nab., MD;  Location: [facility];  Service: Gastroenterology;  Laterality: N/A;   ENDOSCOPIC MUCOSAL RESECTION N/A 04/30/2021   Procedure: ENDOSCOPIC MUCOSAL RESECTION;  Surgeon: Rush Landmark Telford Nab., MD;  [facility];  Service: Gastroenterology;  Laterality: N/A;   ESOPHAGOGASTRODUODENOSCOPY (EGD) WITH PROPOFOL N/A 04/30/2021   Procedure: ESOPHAGOGASTRODUODENOSCOPY (EGD) WITH PROPOFOL;  Surgeon: Rush Landmark Telford Nab., MD;  [facility];  Service: Gastroenterology;  Laterality: N/A;   HEMOSTASIS CLIP PLACEMENT  04/30/2021   Procedure: HEMOSTASIS CLIP PLACEMENT;  Surgeon: Irving Copas., MD;  Location: [facility];  Service: Gastroenterology;;   LUNG CANCER SURGERY  Jan. 2016   partial lobectomy   POLYPECTOMY  04/30/2021   Procedure: POLYPECTOMY;  Surgeon: Mansouraty, Telford Nab., MD;  Location: [facility];  Service: Gastroenterology;;   SHOULDER OPEN ROTATOR CUFF REPAIR Right 09/27/2020   Procedure: Right shoulder mini open rotator cuff repair, distal clavicle resection;  Surgeon: Susa Day, MD;  [facility]: [facility];  Service: Orthopedics;  Laterality: Right;  90 mins  Choice with Block anesthesia   SUBMUCOSAL LIFTING INJECTION  04/30/2021   Procedure: SUBMUCOSAL LIFTING INJECTION;  Surgeon: Irving Copas., MD;  [facility];  Service:  Gastroenterology;;   wipple procedure     stage I- due to bile duct cancer-2014 at [facility] (Dr Vernard Gambles). Stage IA cholangiocarcinoma    Family History  Problem Relation Age of Onset   Prostate cancer Father 24       deceased   Hypertension Mother    Hypertension Brother    Hypertension Sister    Asthma Sister    Esophageal cancer Neg Hx    Colon polyps Neg Hx    Colon cancer Neg Hx    Stomach cancer Neg Hx    Rectal cancer Neg Hx    Inflammatory bowel disease Neg Hx    Liver disease Neg Hx    Pancreatic cancer Neg Hx     Social History   Tobacco Use   Smoking status: Former    Packs/day: 0.25    Years: 40.00    Pack years: 10.00    Types: Cigarettes   Smokeless tobacco: Never   Tobacco comments:    smokes 1-2 cigarettes at night when drinking 07/02/16. Quit 4 months ago  Vaping Use   Vaping Use: Never used  Substance Use Topics   Alcohol use: Yes    Alcohol/week: 6.0 standard drinks    Types: 6 Standard drinks or equivalent per week   Drug use: Never    Current Outpatient Medications  Medication Sig  Dispense Refill   albuterol (PROVENTIL HFA;VENTOLIN HFA) 108 (90 BASE) MCG/ACT inhaler Inhale 2 puffs into the lungs every 6 (six) hours as needed for wheezing or shortness of breath.     allopurinol (ZYLOPRIM) 300 MG tablet Take 300 mg by mouth daily.     Cholecalciferol (VITAMIN D) 50 MCG (2000 UT) tablet Take 2,000 Units by mouth daily.     cyclobenzaprine (FLEXERIL) 5 MG tablet Take 5 mg by mouth at bedtime as needed for muscle spasms.     diclofenac Sodium (VOLTAREN) 1 % GEL Apply 2 g topically at bedtime.     docusate sodium (COLACE) 100 MG capsule Take 200 mg by mouth daily.     fluticasone (FLONASE) 50 MCG/ACT nasal spray Place 2 sprays into both nostrils daily.     HYDROcodone-acetaminophen (NORCO) 7.5-325 MG tablet Take 1 tablet by mouth 2 (two) times daily.     irbesartan-hydrochlorothiazide (AVALIDE) 150-12.5 MG tablet Take 1 tablet by mouth daily.      methocarbamol (ROBAXIN) 500 MG tablet Take 500 mg by mouth daily.     omeprazole (PRILOSEC) 40 MG capsule Take 1 capsule (40 mg total) by mouth daily. 60 capsule 4   SPRYCEL 100 MG tablet TAKE 1 TABLET BY MOUTH  DAILY 30 tablet 5   tamsulosin (FLOMAX) 0.4 MG CAPS capsule Take 0.8 mg by mouth daily.     TRELEGY ELLIPTA 100-62.5-25 MCG/INH AEPB Inhale 1 puff into the lungs daily at 6 (six) AM.     No current facility-administered medications for this visit.    Allergies  Allergen Reactions   Pneumococcal Vaccines Swelling    Review of Systems:  neg     Physical Exam:    BP (!) 158/80   Pulse 70   Ht 5\' 10"  (1.778 m)   Wt 227 lb (103 kg)   SpO2 98%   BMI 32.57 kg/m  Filed Weights   09/03/21 0904  Weight: 227 lb (103 kg)   Gen: awake, alert, NAD HEENT: anicteric, no pallor CV: RRR, no mrg Pulm: CTA b/l Abd: soft, NT/ND, +BS throughout Ext: no c/c/e Neuro: nonfocal   Data Reviewed: I have personally reviewed following labs and imaging studies  CBC: CBC Latest Ref Rng & Units 08/27/2021 04/02/2021 02/12/2021  WBC - 7.0 6.8 6.0  Hemoglobin 13.5 - 17.5 14.2 13.9 13.5  Hematocrit 41 - 53 40(A) 39(A) 39(A)  Platelets 150 - 399 213 193 202    CMP: CMP Latest Ref Rng & Units 08/27/2021 08/27/2021 04/02/2021  Glucose 70 - 99 mg/dL - - -  BUN 4 - 21 11 - 12  Creatinine 0.6 - 1.3 0.6 - 0.6  Sodium 137 - 147 132(A) - 134(A)  Potassium 3.4 - 5.3 4.1 - 3.8  Chloride 99 - 108 102 - 104  CO2 13 - 22 24(A) - 23(A)  Calcium 8.6 - 10.2 mg/dL 8.7 8.3(A) 8.0(A)  Total Protein 6.5 - 8.1 g/dL - - -  Total Bilirubin 0.3 - 1.2 mg/dL - - -  Alkaline Phos 25 - 125 74 - 65  AST 14 - 40 57(A) - 42(A)  ALT 10 - 40 39 - 25      Carmell Austria, MD 09/03/2021, 9:09 AM  Cc: Philmore Pali, NP

## 2021-09-07 DIAGNOSIS — G2581 Restless legs syndrome: Secondary | ICD-10-CM | POA: Diagnosis not present

## 2021-09-07 DIAGNOSIS — J301 Allergic rhinitis due to pollen: Secondary | ICD-10-CM | POA: Diagnosis not present

## 2021-09-07 DIAGNOSIS — J449 Chronic obstructive pulmonary disease, unspecified: Secondary | ICD-10-CM | POA: Diagnosis not present

## 2021-09-10 DIAGNOSIS — D519 Vitamin B12 deficiency anemia, unspecified: Secondary | ICD-10-CM | POA: Diagnosis not present

## 2021-09-11 DIAGNOSIS — M47812 Spondylosis without myelopathy or radiculopathy, cervical region: Secondary | ICD-10-CM | POA: Diagnosis not present

## 2021-09-11 DIAGNOSIS — G894 Chronic pain syndrome: Secondary | ICD-10-CM | POA: Diagnosis not present

## 2021-09-11 DIAGNOSIS — M47816 Spondylosis without myelopathy or radiculopathy, lumbar region: Secondary | ICD-10-CM | POA: Diagnosis not present

## 2021-09-11 DIAGNOSIS — M150 Primary generalized (osteo)arthritis: Secondary | ICD-10-CM | POA: Diagnosis not present

## 2021-09-19 NOTE — Progress Notes (Signed)
Spoke with patient and his co-pay on his Sprycel is going to be $200+, there are no open co-pay funds at this time. Sent in paperwork to [employer] to see if he would qualify for their co-pay assistance or free medication.

## 2021-09-21 NOTE — Progress Notes (Signed)
Got patient enrolled into the co-pay assistance through [employer]. I called to give information to [employer] and they stated patients plan would not allow him to use co-pay cards. I asked [employer] to refer him over to the patient assistance program for free medication.

## 2021-09-25 DIAGNOSIS — N401 Enlarged prostate with lower urinary tract symptoms: Secondary | ICD-10-CM | POA: Diagnosis not present

## 2021-09-25 DIAGNOSIS — R351 Nocturia: Secondary | ICD-10-CM | POA: Diagnosis not present

## 2021-09-26 NOTE — Progress Notes (Signed)
Spoke with patient, he paid the 200 dollar co-pay and ordered his sprycel.I am still checking for open co-pay funds that will help him with the cost for future orders.

## 2021-10-01 ENCOUNTER — Inpatient Hospital Stay: Payer: BC Managed Care – PPO

## 2021-10-01 ENCOUNTER — Other Ambulatory Visit: Payer: Self-pay

## 2021-10-01 ENCOUNTER — Encounter: Payer: Self-pay | Admitting: Licensed Clinical Social Worker

## 2021-10-01 ENCOUNTER — Inpatient Hospital Stay: Payer: BC Managed Care – PPO | Attending: Hematology and Oncology | Admitting: Licensed Clinical Social Worker

## 2021-10-01 DIAGNOSIS — C3492 Malignant neoplasm of unspecified part of left bronchus or lung: Secondary | ICD-10-CM | POA: Diagnosis not present

## 2021-10-01 DIAGNOSIS — Z8042 Family history of malignant neoplasm of prostate: Secondary | ICD-10-CM | POA: Diagnosis not present

## 2021-10-01 DIAGNOSIS — Z860100 Personal history of colon polyps, unspecified: Secondary | ICD-10-CM

## 2021-10-01 DIAGNOSIS — C249 Malignant neoplasm of biliary tract, unspecified: Secondary | ICD-10-CM

## 2021-10-01 DIAGNOSIS — C9210 Chronic myeloid leukemia, BCR/ABL-positive, not having achieved remission: Secondary | ICD-10-CM

## 2021-10-01 HISTORY — DX: Personal history of colon polyps, unspecified: Z86.0100

## 2021-10-01 NOTE — Progress Notes (Signed)
REFERRING PROVIDER: [name], [address] [place],  [place] [address]  PRIMARY PROVIDER:  Philmore Pali, NP  PRIMARY REASON FOR VISIT:  1. Personal history of colonic polyps   2. Family history of prostate cancer   3. CML (chronic myelocytic leukemia) ([place])   4. Non-small cell cancer of left lung ([place])   5. Carcinoma of biliary tract ([place])      HISTORY OF PRESENT ILLNESS:   Kyle Montoya, a 71 y.o. male, was seen for a [place] cancer genetics consultation at the request of Dr.Gupta due to a personal history of colon polyps.  Kyle Montoya presents to clinic today to discuss the possibility of a hereditary predisposition to cancer, genetic testing, and to further clarify his future cancer risks, as well as potential cancer risks for family members.   In 2014, Kyle Montoya was diagnosed with cholangiocarcinoma. This was treated with surgery.  In 2016, Kyle Montoya was diagnosed with non-small cell lung cancer, this was treated with surgery.  Kyle Montoya also has history of CML which is in remission. Kyle Montoya had a colonoscopy in May 2022 that revealed 22 polyps, all tubular adenomas.  Kyle Montoya had a colonoscopy in August 2022 that revealed 14 polyps, tubular adenomas.  He reports having two other colonoscopies in the past that were normal.    CANCER HISTORY:  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[place])  02/15/2013 Initial Diagnosis   Carcinoma of biliary tract ([place])   03/06/2013 Cancer Staging   Staging form: Distal Bile Ducts, AJCC 7th Edition - Clinical stage from 03/06/2013: Stage IA (T1, N0, M0) - Signed by [name], MD on 11/09/2020 Staged by: Managing physician Diagnostic confirmation: Positive histology Specimen type: Excision Histopathologic type: Cholangiocarcinoma Stage prefix: Initial diagnosis Lymph-vascular invasion (LVI): LVI not present (absent)/not identified Residual tumor (R): R0 - None Stage used in treatment  planning: Yes National guidelines used in treatment planning: Yes Type of national guideline used in treatment planning: NCCN    Non-small cell carcinoma of lung ([place])  11/08/2014 Cancer Staging   Staging form: Lung, AJCC 7th Edition - Clinical stage from 11/08/2014: Stage IA (T1b, N0, M0) - Signed by [name], MD on 11/09/2020 Staged by: Managing physician Diagnostic confirmation: Positive histology Specimen type: Excision Histopathologic type: Adenocarcinoma, NOS Stage prefix: Initial diagnosis Laterality: Left Tumor size (mm): 30 Histologic grade (G): G1 Lymph-vascular invasion (LVI): LVI not present (absent)/not identified Residual tumor (R): R0 - None Pleural/elastic layer invasion: PL0 Prognostic indicators: Resection LUL Stage used in treatment planning: Yes National guidelines used in treatment planning: Yes Type of national guideline used in treatment planning: NCCN    01/31/2015 Initial Diagnosis   Non-small cell carcinoma of lung ([place])    Past Medical History:  Diagnosis Date   Arthritis    on PRN meds   Asthma    uses inhaler   BPH (benign prostatic hyperplasia)    CAD (coronary artery disease)    Cataract    bilateral sx   Cholangiocarcinoma of biliary tract (HCC)    Chronic myelogenous leukemia (HCC)    COPD (chronic obstructive pulmonary disease) (HCC)    uses inhaler   DJD (degenerative joint disease)    Dyslipidemia    Family history of colonic polyps    Family history of prostate cancer    Gout    HTN (hypertension)    on meds   Hypercholesterolemia    Lung cancer ([place])  Myelogenous leukemia (HCC)    OA (osteoarthritis)    PAD (peripheral artery disease) (HCC)    Personal history of colonic polyps    Renal insufficiency    Tendonitis    patellar    Past Surgical History:  Procedure Laterality Date   BIOPSY  04/30/2021   Procedure: BIOPSY;  Surgeon: Rush Landmark, Telford Nab., MD;  [facility];  Service:  Gastroenterology;;   Bowel duct surg  2014   Bowel cancer   CARDIAC CATHETERIZATION  2000   CHOLECYSTECTOMY     COLONOSCOPY  06/23/2015   mild sigmoid diverticulosis. small external and internal hemorrhoids. otherwise normal colonoscopy to terminal ileum   COLONOSCOPY WITH PROPOFOL N/A 04/30/2021   Procedure: COLONOSCOPY WITH PROPOFOL;  Surgeon: Rush Landmark Telford Nab., MD;  Location: [facility];  Service: Gastroenterology;  Laterality: N/A;   ENDOSCOPIC MUCOSAL RESECTION N/A 04/30/2021   Procedure: ENDOSCOPIC MUCOSAL RESECTION;  Surgeon: Rush Landmark Telford Nab., MD;  [facility];  Service: Gastroenterology;  Laterality: N/A;   ESOPHAGOGASTRODUODENOSCOPY (EGD) WITH PROPOFOL N/A 04/30/2021   Procedure: ESOPHAGOGASTRODUODENOSCOPY (EGD) WITH PROPOFOL;  Surgeon: Rush Landmark Telford Nab., MD;  [facility];  Service: Gastroenterology;  Laterality: N/A;   HEMOSTASIS CLIP PLACEMENT  04/30/2021   Procedure: HEMOSTASIS CLIP PLACEMENT;  Surgeon: Irving Copas., MD;  Location: [facility];  Service: Gastroenterology;;   LUNG CANCER SURGERY  Jan. 2016   partial lobectomy   POLYPECTOMY  04/30/2021   Procedure: POLYPECTOMY;  Surgeon: Mansouraty, Telford Nab., MD;  Location: [facility];  Service: Gastroenterology;;   SHOULDER OPEN ROTATOR CUFF REPAIR Right 09/27/2020   Procedure: Right shoulder mini open rotator cuff repair, distal clavicle resection;  Surgeon: Susa Day, MD;  [facility]: [facility];  Service: Orthopedics;  Laterality: Right;  90 mins  Choice with Block anesthesia   SUBMUCOSAL LIFTING INJECTION  04/30/2021   Procedure: SUBMUCOSAL LIFTING INJECTION;  Surgeon: Irving Copas., MD;  [facility];  Service: Gastroenterology;;   wipple procedure     stage I- due to bile duct cancer-2014 at [facility] (Dr Vernard Gambles). Stage IA cholangiocarcinoma    Social History   Socioeconomic History   Marital status: Married    Spouse name: Not on file   Number of children: 2    Years of education: Not on file   Highest education level: Not on file  Occupational History   Occupation: environmental  Tobacco Use   Smoking status: Former    Packs/day: 0.25    Years: 40.00    Pack years: 10.00    Types: Cigarettes   Smokeless tobacco: Never   Tobacco comments:    smokes 1-2 cigarettes at night when drinking 07/02/16. Quit 4 months ago  Vaping Use   Vaping Use: Never used  Substance and Sexual Activity   Alcohol use: Yes    Alcohol/week: 6.0 standard drinks    Types: 6 Standard drinks or equivalent per week   Drug use: Never   Sexual activity: Not on file  Other Topics Concern   Not on file  Social History Narrative   Married, 2 children.   Works moves heavy equipment x 30 yrs.   Social Determinants of Health   Financial Resource Strain: Not on file  Food Insecurity: Not on file  Transportation Needs: Not on file  Physical Activity: Not on file  Stress: Not on file  Social Connections: Not on file     FAMILY HISTORY:  We obtained a detailed, 4-generation family history.  Significant diagnoses are listed below: Family  History  Problem Relation Age of Onset   Hypertension Mother    Prostate cancer Father 80       deceased   Hypertension Sister    Asthma Sister    Hypertension Brother    Cancer Half-Sister        unknown, dx 48   Esophageal cancer Neg Hx    Colon polyps Neg Hx    Colon cancer Neg Hx    Stomach cancer Neg Hx    Rectal cancer Neg Hx    Inflammatory bowel disease Neg Hx    Liver disease Neg Hx    Pancreatic cancer Neg Hx    Kyle Montoya has 1 son, 92, 1 daughter, 46, neither have had colonoscopy. Kyle Montoya has 1 full brother, 2 maternal half brothers, and 3 maternal half sisters. One of his half sisters had cancer that spread to her lymph nodes at 58, unknown primary.   Kyle Montoya mother is living at 72 with no known history of cancer or polyps. Patient had 3 maternal aunts, no known cancers. No information about maternal  cousins. Maternal grandparents died in their 74s.  Kyle Montoya father had prostate cancer and died from it at 80. Patient has very limited information about his paternal side, but is aware of an aunt and uncle who died of cancer, unknown types.  Kyle Montoya is unaware of previous family history of genetic testing for hereditary cancer risks. Patient's maternal ancestors are of unknown descent, and paternal ancestors are of unknown descent. There is no reported Ashkenazi Jewish ancestry. There is no known consanguinity.    GENETIC COUNSELING ASSESSMENT: Kyle Montoya is a 71 y.o. male with a personal history of cancer and colon polyps which is somewhat suggestive of a hereditary cancer syndrome and predisposition to cancer. We, therefore, discussed and recommended the following at today's visit.   DISCUSSION:  We discussed that polyps in general are common, however, most people have fewer than 5 lifetime polyps.  When an individual has 10 or more polyps we become concerned about an underlying polyposis syndrome.  The most common hereditary polyposis syndromes are caused by problems in the APC and MUTYH genes. We also discussed that approximately 10% of  cancer is hereditary. Cancers and risks are gene specific. We discussed that testing is beneficial for several reasons including knowing about other cancer risks, identifying potential screening and risk-reduction options that may be appropriate, and to understand if other family members could be at risk for cancer and allow them to undergo genetic testing.   We reviewed the characteristics, features and inheritance patterns of hereditary cancer syndromes. We also discussed genetic testing, including the appropriate family members to test, the process of testing, insurance coverage and turn-around-time for results. We discussed the implications of a negative, positive and/or variant of uncertain significant result. We recommended Kyle Montoya pursue genetic  testing for the [facility] Multi-Cancer+RNA gene panel.   The Multi-Cancer Panel + RNA offered by [facility] includes sequencing and/or deletion duplication testing of the following 84 genes: AIP, ALK, APC, ATM, AXIN2,BAP1,  BARD1, BLM, BMPR1A, BRCA1, BRCA2, BRIP1, CASR, CDC73, CDH1, CDK4, CDKN1B, CDKN1C, CDKN2A (p14ARF), CDKN2A (p16INK4a), CEBPA, CHEK2, CTNNA1, DICER1, DIS3L2, EGFR (c.2369C>T, p.Thr790Met variant only), EPCAM (Deletion/duplication testing only), FH, FLCN, GATA2, GPC3, GREM1 (Promoter region deletion/duplication testing only), HOXB13 (c.251G>A, p.Gly84Glu), HRAS, KIT, MAX, MEN1, MET, MITF (c.952G>A, p.Glu318Lys variant only), MLH1, MSH2, MSH3, MSH6, MUTYH, NBN, NF1, NF2, NTHL1, PALB2, PDGFRA, PHOX2B, PMS2, POLD1, POLE, POT1, PRKAR1A, PTCH1, PTEN, RAD50, RAD51C,  RAD51D, RB1, RECQL4, RET, RUNX1, SDHAF2, SDHA (sequence changes only), SDHB, SDHC, SDHD, SMAD4, SMARCA4, SMARCB1, SMARCE1, STK11, SUFU, TERC, TERT, TMEM127, TP53, TSC1, TSC2, VHL, WRN and WT1.  Based on Kyle Montoya personal history of polyps he meets medical criteria for genetic testing. Despite that he meets criteria, he may still have an out of pocket cost. We discussed that if his out of pocket cost for testing is over $100, the laboratory will call and confirm whether he wants to proceed with testing.  If the out of pocket cost of testing is less than $100 he will be billed by the genetic testing laboratory.  ening protocols, and to be vigilant in maintaining regular contact with their local genetics clinic in anticipation of new discoveries regarding hereditary cancer genetic testing.    PLAN: After considering the risks, benefits, and limitations, Kyle Montoya provided informed consent to pursue genetic testing and the blood sample was sent to [facility] for analysis of the Multi-Cancer+RNA panel. Results should be available within approximately 2-3 weeks' time, at which point they will be disclosed by telephone to Kyle Montoya,  as will any additional recommendations warranted by these results. Kyle Montoya will receive a summary of his genetic counseling visit and a copy of his results once available. This information will also be available in Epic.   Mr. Pavon questions were answered to his satisfaction today. Our contact information was provided should additional questions or concerns arise. Thank you for the referral and allowing [place] to share in the care of your patient.   Faith Rogue, MS, [facility] Genetic Counselor [email or web address] Phone: [phone]  The patient was seen for a total of 25 minutes in face-to-face genetic counseling.  Patient was seen alone. Dr. Grayland Ormond was available for discussion regarding this case.   _______________________________________________________________________ For Office Staff:  Number of people involved in session: 1 Was an Intern/ student involved with case: no

## 2021-10-02 ENCOUNTER — Telehealth: Payer: Self-pay | Admitting: Licensed Clinical Social Worker

## 2021-10-02 NOTE — Telephone Encounter (Signed)
Left voicemail for patient regarding his missed blood draw yesterday. Let him know we can reschedule or have his blood drawn when he sees Dr. Hinton Rao in March if he is still interested in genetic testing. Left my number and requested call back.

## 2021-10-11 DIAGNOSIS — D519 Vitamin B12 deficiency anemia, unspecified: Secondary | ICD-10-CM | POA: Diagnosis not present

## 2021-10-23 ENCOUNTER — Other Ambulatory Visit (HOSPITAL_COMMUNITY): Payer: Self-pay

## 2021-11-06 DIAGNOSIS — M47812 Spondylosis without myelopathy or radiculopathy, cervical region: Secondary | ICD-10-CM | POA: Diagnosis not present

## 2021-11-06 DIAGNOSIS — M150 Primary generalized (osteo)arthritis: Secondary | ICD-10-CM | POA: Diagnosis not present

## 2021-11-06 DIAGNOSIS — G894 Chronic pain syndrome: Secondary | ICD-10-CM | POA: Diagnosis not present

## 2021-11-06 DIAGNOSIS — M47816 Spondylosis without myelopathy or radiculopathy, lumbar region: Secondary | ICD-10-CM | POA: Diagnosis not present

## 2021-11-20 DIAGNOSIS — M109 Gout, unspecified: Secondary | ICD-10-CM | POA: Diagnosis not present

## 2021-11-20 DIAGNOSIS — I10 Essential (primary) hypertension: Secondary | ICD-10-CM | POA: Diagnosis not present

## 2021-11-20 DIAGNOSIS — D519 Vitamin B12 deficiency anemia, unspecified: Secondary | ICD-10-CM | POA: Diagnosis not present

## 2021-11-20 DIAGNOSIS — E559 Vitamin D deficiency, unspecified: Secondary | ICD-10-CM | POA: Diagnosis not present

## 2021-11-21 NOTE — Progress Notes (Signed)
[facility]  [address] [place],  [place]  [address] [phone]  Clinic Day:  11/27/2021  Referring physician: Philmore Pali, NP  This document serves as a record of services personally performed by Hosie Poisson, MD. It was created on their behalf by Curry,Lauren E, a trained medical scribe. The creation of this record is based on the scribe's personal observations and the provider's statements to them.  ASSESSMENT & PLAN:   Assessment & Plan: 1. 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  He had a mild relapse in May 2018, but by that August, the testing returned to complete molecular remission and he has remained in complete molecular remission since that time. PCR for BCR/ABL is pending from today. He knows to continue dasatinib daily.    2. History of stage IA non-small cell lung cancer in 2016 treated with surgery alone.  CT imaging in October 2022 did not reveal any evidence of recurrence.    3. History of stage IA cholangiocarcinoma in 2014 treated with surgery alone.  He has stable intermittent abdominal pain, which may be related to dasatinib, but we suspect this could represent adhesions from surgery.  CT imaging in October 2022 did not reveal any evidence of recurrence.   4. Rare tobacco use, but I once again advised him on the importance of complete abstinence from tobacco.    5. Mild hypocalcemia, I advised that he start oral supplement BID.    6. B12 deficiency.  He remains on B12 injections monthly with his primary care office.   7. History of gastric ulcers with H pylori infection.  This was treated appropriately by Dr. Lyndel Safe and he is on omeprazole.  He does not have symptoms of recurrence.     8.  Right rotator cuff shoulder surgery in early 2022.    He knows to continue dasatinib daily. He knows to start oral calcium BID. We will plan to see him back in 3 months with a CBC, comprehensive metabolic panel and PCR for BCR/ABL.   He will be due for his yearly scans in the fall. The patient understands the plans discussed today and is in agreement with them.  He knows to contact our office if he develops concerns prior to his next appointment.   I provided 15 minutes of face-to-face time during this this encounter and > 50% was spent counseling as documented under my assessment and plan.    [facility] [address] [place] [address] Dept: [phone] Dept Fax: [phone]   No orders of the defined types were placed in this encounter.     CHIEF COMPLAINT:  CC: Chronic myelogenous leukemia with history of lung cancer and cholangiocarcinoma  Current Treatment: Dasatinib 100 mg daily   HISTORY OF PRESENT ILLNESS:  Chronic myelogenous leukemia diagnosed in October 2015 when he presented with leukocytosis.  He was originally placed on nilotinib, but developed a severe pruritic rash, so was switched to dasatinib 100 mg daily.  He achieved a major molecular remission by May 2016.   He also has a history of a stage IA cholangiocarcinoma, treated with surgical resection in June 2014.   He then underwent surgical resection of a stage IA (T1 N0 M0) non-small cell lung cancer in February 2016.  Pathology revealed a 3 cm, well-differentiated, adenocarcinoma with negative nodes.  Adjuvant chemotherapy for his lung cancer was not recommended.  He has  chronic back pain and continues seeing spine specialist.  He apparently has significant degenerative disc disease.  He has also had upper abdominal pain, which he attributes to dasatinib.  He uses hydrocodone/APAP as needed for his pain.  In May 2018, PCR was positive at 0.012%, consistent with residual CML.  Repeat PCR in August 2018 revealed a major molecular response.  His CML has remained in remission since that time.  CT chest, abdomen and pelvis in November 2019 did not reveal evidence of  recurrence of either malignancy.     CT chest in August 2020 did not reveal any evidence of recurrent lung malignancy.  He has had hypocalcemia, so was instructed to take calcium 600 mg twice daily.  When he was seen in November 2020, had been having lower leg pain worsening throughout the day.  He underwent arterial ultrasound/ankle brachial indices, which was normal.  The pain was then felt to possibly be degenerative in nature.  He reported constipation due to the calcium and as his on calcium was normal at that time,  we decreased the calcium to daily.  He had worsening neutropenia and was found to have B12 and placed on B12 injections weekly for 4 weeks, then monthly.  Serum folate was normal.  He had persistent abdominal pain, so was referred to Dr. Lyndel Safe.  He was seen by Dr. Lyndel Safe in 2021 and had 2 gastric ulcers and H. Pylori infection, which was treated.  He saw Dr. Nila Nephew earlier in 2021 as well, and PSA was normal. When he was seen in August, he had recurrent hypocalcemia, with a calcium of 8.3.  He could not tolerate the calcium supplement, so we recommended that he take Tums 4 times daily.  He continued to complain of intermittent abdominal pain, which we have attributed to adhesions.  He has had rotator cuff surgery in January 2022.  He has had a colonoscopy by Dr. Lyndel Safe with findings of a large polyp.  He will be going back on August 8th to have this resected through colonoscopy.  He missed his appointment in May but Dr. Lyndel Safe told him "his labs were good".  I looked up and his PCR for BCR/ABL continues to be negative on the May reading.  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[facility])  02/15/2013 Initial Diagnosis   Carcinoma of biliary tract ([facility])   03/06/2013 Cancer Staging   Staging form: Distal Bile Ducts, AJCC 7th Edition - Clinical stage from 03/06/2013: Stage IA (T1, N0, M0) - Signed by [name], MD on 11/09/2020 Staged by: Managing physician Diagnostic confirmation:  Positive histology Specimen type: Excision Histopathologic type: Cholangiocarcinoma Stage prefix: Initial diagnosis Lymph-vascular invasion (LVI): LVI not present (absent)/not identified Residual tumor (R): R0 - None Stage used in treatment planning: Yes National guidelines used in treatment planning: Yes Type of national guideline used in treatment planning: NCCN    Non-small cell carcinoma of lung ([facility])  11/08/2014 Cancer Staging   Staging form: Lung, AJCC 7th Edition - Clinical stage from 11/08/2014: Stage IA (T1b, N0, M0) - Signed by [name], MD on 11/09/2020 Staged by: Managing physician Diagnostic confirmation: Positive histology Specimen type: Excision Histopathologic type: Adenocarcinoma, NOS Stage prefix: Initial diagnosis Laterality: Left Tumor size (mm): 30 Histologic grade (G): G1 Lymph-vascular invasion (LVI): LVI not present (absent)/not identified Residual tumor (R): R0 - None Pleural/elastic layer invasion: PL0 Prognostic indicators: Resection LUL Stage used in treatment planning: Yes National guidelines used in treatment planning: Yes Type of national  guideline used in treatment planning: NCCN    01/31/2015 Initial Diagnosis   Non-small cell carcinoma of lung ([facility])      INTERVAL HISTORY:  Pritesh is here for routine follow up and states that he has been well and denies complaints. His abdominal pain is much better since starting omeprazole. He denies any adenopathy. He continues dasatinib daily without difficulty. He continues oral vitamin D. He is not currently on oral calcium. Hemoglobin has mildly decreased from 14.2 to 13.5 and white count and platelets are normal. Chemistries are unremarkable except for a sodium of 133, and a calcium of 8.3. I advised that he start oral calcium supplement BID. His  appetite is fair, and he has lost nearly 8 pounds since his last visit.  He denies fever, chills or other signs of infection.  He denies nausea,  vomiting, bowel issues, or abdominal pain.  He denies sore throat, cough, dyspnea, or chest pain.  REVIEW OF SYSTEMS:  Review of Systems  Constitutional: Negative.  Negative for appetite change, chills, fatigue, fever and unexpected weight change.  HENT:  Negative.    Eyes: Negative.   Respiratory: Negative.  Negative for chest tightness, cough, hemoptysis, shortness of breath and wheezing.   Cardiovascular: Negative.  Negative for chest pain, leg swelling and palpitations.  Gastrointestinal: Negative.  Negative for abdominal distention, abdominal pain, blood in stool, constipation, diarrhea, nausea and vomiting.  Endocrine: Negative.   Genitourinary: Negative.  Negative for difficulty urinating, dysuria, frequency and hematuria.   Musculoskeletal: Negative.  Negative for arthralgias, back pain, flank pain, gait problem and myalgias.  Skin: Negative.   Neurological: Negative.  Negative for dizziness, extremity weakness, gait problem, headaches, light-headedness, numbness, seizures and speech difficulty.  Hematological: Negative.   Psychiatric/Behavioral: Negative.  Negative for depression and sleep disturbance. The patient is not nervous/anxious.   All other systems reviewed and are negative.   VITALS:  Blood pressure (!) 153/78, pulse 63, temperature 97.9 F (36.6 C), temperature source Oral, resp. rate 18, height 5\' 10"  (1.778 m), weight 218 lb (98.9 kg), SpO2 98 %.  Wt Readings from Last 3 Encounters:  11/27/21 218 lb (98.9 kg)  09/03/21 227 lb (103 kg)  08/27/21 225 lb 9.6 oz (102.3 kg)    Body mass index is 31.28 kg/m.  Performance status (ECOG): 0 - Asymptomatic  PHYSICAL EXAM:  Physical Exam Constitutional:      General: He is not in acute distress.    Appearance: Normal appearance. He is normal weight.  HENT:     Head: Normocephalic and atraumatic.  Eyes:     General: No scleral icterus.    Extraocular Movements: Extraocular movements intact.     Conjunctiva/sclera:  Conjunctivae normal.     Pupils: Pupils are equal, round, and reactive to light.  Cardiovascular:     Rate and Rhythm: Normal rate and regular rhythm.     Pulses: Normal pulses.     Heart sounds: Normal heart sounds. No murmur heard.   No friction rub. No gallop.  Pulmonary:     Effort: Pulmonary effort is normal. No respiratory distress.     Breath sounds: Normal breath sounds.  Abdominal:     General: Bowel sounds are normal. There is no distension.     Palpations: Abdomen is soft. There is no hepatomegaly, splenomegaly or mass.     Tenderness: There is no abdominal tenderness.  Musculoskeletal:        General: Normal range of motion.     Cervical back:  Normal range of motion and neck supple.     Right lower leg: No edema.     Left lower leg: No edema.  Lymphadenopathy:     Cervical: No cervical adenopathy.  Skin:    General: Skin is warm and dry.  Neurological:     General: No focal deficit present.     Mental Status: He is alert and oriented to person, place, and time. Mental status is at baseline.  Psychiatric:        Mood and Affect: Mood normal.        Behavior: Behavior normal.        Thought Content: Thought content normal.        Judgment: Judgment normal.    LABS:   CBC Latest Ref Rng & Units 11/27/2021 08/27/2021 04/02/2021  WBC - 5.5 7.0 6.8  Hemoglobin 13.5 - 17.5 13.5 14.2 13.9  Hematocrit 41 - 53 38(A) 40(A) 39(A)  Platelets 150 - 399 223 213 193   CMP Latest Ref Rng & Units 11/27/2021 08/27/2021 08/27/2021  Glucose 70 - 99 mg/dL - - -  BUN 4 - 21 11 11  -  Creatinine 0.6 - 1.3 0.7 0.6 -  Sodium 137 - 147 133(A) 132(A) -  Potassium 3.4 - 5.3 3.7 4.1 -  Chloride 99 - 108 101 102 -  CO2 13 - 22 26(A) 24(A) -  Calcium 8.7 - 10.7 8.4(A) 8.7 8.3(A)  Total Protein 6.5 - 8.1 g/dL - - -  Total Bilirubin 0.3 - 1.2 mg/dL - - -  Alkaline Phos 25 - 125 54 74 -  AST 14 - 40 55(A) 57(A) -  ALT 10 - 40 36 39 -     No results found for: CEA1 / No results found for:  CEA1  No results found for: KYH062  STUDIES:   HISTORY:   Allergies:  Allergies  Allergen Reactions   Pneumococcal Vaccines Swelling    Current Medications: Current Outpatient Medications  Medication Sig Dispense Refill   albuterol (PROVENTIL HFA;VENTOLIN HFA) 108 (90 BASE) MCG/ACT inhaler Inhale 2 puffs into the lungs every 6 (six) hours as needed for wheezing or shortness of breath.     allopurinol (ZYLOPRIM) 300 MG tablet Take 300 mg by mouth daily.     Cholecalciferol (VITAMIN D) 50 MCG (2000 UT) tablet Take 2,000 Units by mouth daily.     cyclobenzaprine (FLEXERIL) 5 MG tablet Take 5 mg by mouth at bedtime as needed for muscle spasms.     diclofenac Sodium (VOLTAREN) 1 % GEL Apply 2 g topically at bedtime.     docusate sodium (COLACE) 100 MG capsule Take 200 mg by mouth daily.     fluticasone (FLONASE) 50 MCG/ACT nasal spray Place 2 sprays into both nostrils daily.     HYDROcodone-acetaminophen (NORCO) 7.5-325 MG tablet Take 1 tablet by mouth 2 (two) times daily.     irbesartan-hydrochlorothiazide (AVALIDE) 150-12.5 MG tablet Take 1 tablet by mouth daily.     methocarbamol (ROBAXIN) 500 MG tablet Take 500 mg by mouth daily.     omeprazole (PRILOSEC) 40 MG capsule Take 1 capsule (40 mg total) by mouth daily. 60 capsule 4   SPRYCEL 100 MG tablet TAKE 1 TABLET BY MOUTH  DAILY 30 tablet 5   tamsulosin (FLOMAX) 0.4 MG CAPS capsule Take 0.8 mg by mouth daily.     TRELEGY ELLIPTA 100-62.5-25 MCG/INH AEPB Inhale 1 puff into the lungs daily at 6 (six) AM.     No current  facility-administered medications for this visit.     I, Rita Ohara, am acting as scribe for [name], MD  I have reviewed this report as typed by the medical scribe, and it is complete and accurate.

## 2021-11-27 ENCOUNTER — Other Ambulatory Visit: Payer: Self-pay | Admitting: Oncology

## 2021-11-27 ENCOUNTER — Other Ambulatory Visit: Payer: Self-pay

## 2021-11-27 ENCOUNTER — Telehealth: Payer: Self-pay | Admitting: Oncology

## 2021-11-27 ENCOUNTER — Encounter: Payer: Self-pay | Admitting: Oncology

## 2021-11-27 ENCOUNTER — Inpatient Hospital Stay: Payer: BC Managed Care – PPO

## 2021-11-27 ENCOUNTER — Inpatient Hospital Stay: Payer: BC Managed Care – PPO | Attending: Hematology and Oncology | Admitting: Oncology

## 2021-11-27 VITALS — BP 153/78 | HR 63 | Temp 97.9°F | Resp 18 | Ht 70.0 in | Wt 218.0 lb

## 2021-11-27 DIAGNOSIS — Z8711 Personal history of peptic ulcer disease: Secondary | ICD-10-CM | POA: Diagnosis not present

## 2021-11-27 DIAGNOSIS — C249 Malignant neoplasm of biliary tract, unspecified: Secondary | ICD-10-CM

## 2021-11-27 DIAGNOSIS — E538 Deficiency of other specified B group vitamins: Secondary | ICD-10-CM | POA: Diagnosis not present

## 2021-11-27 DIAGNOSIS — Z8505 Personal history of malignant neoplasm of liver: Secondary | ICD-10-CM | POA: Diagnosis not present

## 2021-11-27 DIAGNOSIS — C3492 Malignant neoplasm of unspecified part of left bronchus or lung: Secondary | ICD-10-CM | POA: Diagnosis not present

## 2021-11-27 DIAGNOSIS — C9210 Chronic myeloid leukemia, BCR/ABL-positive, not having achieved remission: Secondary | ICD-10-CM

## 2021-11-27 DIAGNOSIS — Z79899 Other long term (current) drug therapy: Secondary | ICD-10-CM | POA: Insufficient documentation

## 2021-11-27 DIAGNOSIS — Z85118 Personal history of other malignant neoplasm of bronchus and lung: Secondary | ICD-10-CM | POA: Insufficient documentation

## 2021-11-27 LAB — CBC AND DIFFERENTIAL
HCT: 38 — AB (ref 41–53)
Hemoglobin: 13.5 (ref 13.5–17.5)
Neutrophils Absolute: 1.87
Platelets: 223 (ref 150–399)
WBC: 5.5

## 2021-11-27 LAB — HEPATIC FUNCTION PANEL
ALT: 36 (ref 10–40)
AST: 55 — AB (ref 14–40)
Alkaline Phosphatase: 54 (ref 25–125)
Bilirubin, Total: 0.9

## 2021-11-27 LAB — BASIC METABOLIC PANEL
BUN: 11 (ref 4–21)
CO2: 26 — AB (ref 13–22)
Chloride: 101 (ref 99–108)
Creatinine: 0.7 (ref 0.6–1.3)
Glucose: 99
Potassium: 3.7 (ref 3.4–5.3)
Sodium: 133 — AB (ref 137–147)

## 2021-11-27 LAB — COMPREHENSIVE METABOLIC PANEL
Albumin: 3.9 (ref 3.5–5.0)
Calcium: 8.4 — AB (ref 8.7–10.7)

## 2021-11-27 LAB — CBC: RBC: 3.95 (ref 3.87–5.11)

## 2021-11-27 NOTE — Telephone Encounter (Signed)
Per 11/27/21 los next appt scheduled and confirmed with patient ?

## 2021-11-28 LAB — CEA: CEA: 2.7 ng/mL (ref 0.0–4.7)

## 2021-11-28 LAB — CANCER ANTIGEN 19-9: CA 19-9: 12 U/mL (ref 0–35)

## 2021-11-29 ENCOUNTER — Encounter: Payer: Self-pay | Admitting: Oncology

## 2021-11-29 DIAGNOSIS — Z85118 Personal history of other malignant neoplasm of bronchus and lung: Secondary | ICD-10-CM | POA: Diagnosis not present

## 2021-11-29 DIAGNOSIS — C9210 Chronic myeloid leukemia, BCR/ABL-positive, not having achieved remission: Secondary | ICD-10-CM | POA: Diagnosis not present

## 2021-11-29 DIAGNOSIS — Z8711 Personal history of peptic ulcer disease: Secondary | ICD-10-CM | POA: Diagnosis not present

## 2021-11-29 DIAGNOSIS — Z8505 Personal history of malignant neoplasm of liver: Secondary | ICD-10-CM | POA: Diagnosis not present

## 2021-11-29 DIAGNOSIS — E538 Deficiency of other specified B group vitamins: Secondary | ICD-10-CM | POA: Diagnosis not present

## 2021-11-29 NOTE — Progress Notes (Unsigned)
Received authorization approval for CPT 81206 & 81207 BCR/ABL 1 ?[id number]#[id number] ?VALID 11/27/2021 - 12/27/2021 ?

## 2021-12-04 ENCOUNTER — Other Ambulatory Visit: Payer: Self-pay | Admitting: Oncology

## 2021-12-05 ENCOUNTER — Other Ambulatory Visit: Payer: Self-pay

## 2021-12-07 ENCOUNTER — Telehealth: Payer: Self-pay | Admitting: Gastroenterology

## 2021-12-07 NOTE — Telephone Encounter (Signed)
Inbound call from patient would like to schedule recall hospital procedure  ?

## 2021-12-07 NOTE — Telephone Encounter (Signed)
Kyle Montoya this pt is a Dr Lyndel Safe pt that is calling to schedule a recall colon.  Per the last office note with Dr Lyndel Safe he (Dr Lyndel Safe) recommended EGD/Colon.   ? ? ?"It has been recommended to you by your physician that you have a(n) EGD/Colon completed. Per your request, we did not schedule the procedure(s) today. Please contact our office at [phone] should you decide to have the procedure completed. You will be scheduled for a pre-visit and procedure at that time. Please call in January to see about scheduling in March or April. You will need a 2 day prep ?  ?Referral placed as well. ?  ?Thank you, ?  ?Dr. [name]" ?  ?  ?

## 2021-12-07 NOTE — Telephone Encounter (Signed)
Pt scheduled for an EGD/Colon on 01/02/2022 at 3:30 in the [facility] with Dr. Lyndel Safe: Pt made aware ?Pt scheduled for an previsit appointment on 3/23 /2023 at 10:30: Telephone Visit: Pt made aware ?Pt verbalized understanding with all questions answered.  ? ?

## 2021-12-10 LAB — BCR-ABL1, CML/ALL, PCR, QUANT

## 2021-12-13 ENCOUNTER — Other Ambulatory Visit: Payer: Self-pay

## 2021-12-13 ENCOUNTER — Ambulatory Visit (AMBULATORY_SURGERY_CENTER): Payer: BC Managed Care – PPO | Admitting: *Deleted

## 2021-12-13 VITALS — Ht 70.0 in | Wt 218.0 lb

## 2021-12-13 DIAGNOSIS — Z86010 Personal history of colonic polyps: Secondary | ICD-10-CM

## 2021-12-13 DIAGNOSIS — Z8711 Personal history of peptic ulcer disease: Secondary | ICD-10-CM

## 2021-12-13 MED ORDER — PEG 3350-KCL-NA BICARB-NACL 420 G PO SOLR: 4000.0000 mL | Freq: Once | ORAL | 0 refills | Status: AC

## 2021-12-13 NOTE — Progress Notes (Signed)
Patient's pre-visit was done today over the phone with the patient. Name,DOB and address verified. Patient denies any allergies to Eggs and Soy. Patient denies any problems with anesthesia/sedation. Patient is not taking any diet pills or blood thinners. No home Oxygen. Patient denies any medical chart hx changes since last GI OV. ? ?Prep instructions sent to pt's mail-pt aware. Patient understands to call us back with any questions or concerns. Patient is aware of our care-partner policy and [id number] safety protocol.  ?  ? ?The patient is COVID-19 vaccinated.   ?

## 2021-12-24 DIAGNOSIS — I729 Aneurysm of unspecified site: Secondary | ICD-10-CM | POA: Diagnosis not present

## 2021-12-24 DIAGNOSIS — J449 Chronic obstructive pulmonary disease, unspecified: Secondary | ICD-10-CM | POA: Diagnosis not present

## 2021-12-24 DIAGNOSIS — J301 Allergic rhinitis due to pollen: Secondary | ICD-10-CM | POA: Diagnosis not present

## 2021-12-24 DIAGNOSIS — G2581 Restless legs syndrome: Secondary | ICD-10-CM | POA: Diagnosis not present

## 2021-12-25 ENCOUNTER — Encounter: Payer: Self-pay | Admitting: Certified Registered Nurse Anesthetist

## 2021-12-27 DIAGNOSIS — D513 Other dietary vitamin B12 deficiency anemia: Secondary | ICD-10-CM | POA: Diagnosis not present

## 2021-12-31 DIAGNOSIS — E559 Vitamin D deficiency, unspecified: Secondary | ICD-10-CM | POA: Diagnosis not present

## 2021-12-31 DIAGNOSIS — I10 Essential (primary) hypertension: Secondary | ICD-10-CM | POA: Diagnosis not present

## 2021-12-31 DIAGNOSIS — D519 Vitamin B12 deficiency anemia, unspecified: Secondary | ICD-10-CM | POA: Diagnosis not present

## 2021-12-31 DIAGNOSIS — C9591 Leukemia, unspecified, in remission: Secondary | ICD-10-CM | POA: Diagnosis not present

## 2022-01-01 ENCOUNTER — Encounter: Payer: Self-pay | Admitting: Certified Registered Nurse Anesthetist

## 2022-01-01 DIAGNOSIS — M150 Primary generalized (osteo)arthritis: Secondary | ICD-10-CM | POA: Diagnosis not present

## 2022-01-01 DIAGNOSIS — M47816 Spondylosis without myelopathy or radiculopathy, lumbar region: Secondary | ICD-10-CM | POA: Diagnosis not present

## 2022-01-01 DIAGNOSIS — G894 Chronic pain syndrome: Secondary | ICD-10-CM | POA: Diagnosis not present

## 2022-01-01 DIAGNOSIS — M47812 Spondylosis without myelopathy or radiculopathy, cervical region: Secondary | ICD-10-CM | POA: Diagnosis not present

## 2022-01-01 DIAGNOSIS — Z79891 Long term (current) use of opiate analgesic: Secondary | ICD-10-CM | POA: Diagnosis not present

## 2022-01-02 ENCOUNTER — Encounter: Payer: Self-pay | Admitting: Gastroenterology

## 2022-01-02 ENCOUNTER — Ambulatory Visit (AMBULATORY_SURGERY_CENTER): Payer: BC Managed Care – PPO | Admitting: Gastroenterology

## 2022-01-02 VITALS — BP 137/76 | HR 64 | Temp 98.8°F | Resp 10 | Ht 70.0 in | Wt 218.0 lb

## 2022-01-02 DIAGNOSIS — Z86010 Personal history of colonic polyps: Secondary | ICD-10-CM

## 2022-01-02 DIAGNOSIS — D123 Benign neoplasm of transverse colon: Secondary | ICD-10-CM | POA: Diagnosis not present

## 2022-01-02 DIAGNOSIS — Z09 Encounter for follow-up examination after completed treatment for conditions other than malignant neoplasm: Secondary | ICD-10-CM | POA: Diagnosis not present

## 2022-01-02 DIAGNOSIS — J449 Chronic obstructive pulmonary disease, unspecified: Secondary | ICD-10-CM | POA: Diagnosis not present

## 2022-01-02 DIAGNOSIS — D125 Benign neoplasm of sigmoid colon: Secondary | ICD-10-CM

## 2022-01-02 DIAGNOSIS — D124 Benign neoplasm of descending colon: Secondary | ICD-10-CM | POA: Diagnosis not present

## 2022-01-02 DIAGNOSIS — Z8711 Personal history of peptic ulcer disease: Secondary | ICD-10-CM

## 2022-01-02 DIAGNOSIS — I2510 Atherosclerotic heart disease of native coronary artery without angina pectoris: Secondary | ICD-10-CM | POA: Diagnosis not present

## 2022-01-02 DIAGNOSIS — I10 Essential (primary) hypertension: Secondary | ICD-10-CM | POA: Diagnosis not present

## 2022-01-02 MED ORDER — SODIUM CHLORIDE 0.9 % IV SOLN: 500.0000 mL | Freq: Once | INTRAVENOUS | Status: DC

## 2022-01-02 NOTE — Progress Notes (Signed)
Report given to PACU, vss 

## 2022-01-02 NOTE — Progress Notes (Signed)
1404 Robinul 0.1 mg IV given due large amount of secretions upon assessment.  MD made aware, vss  ?

## 2022-01-02 NOTE — Op Note (Signed)
[facility] ?Patient Name: Kyle Montoya ?Procedure Date: 01/02/2022 2:02 PM ?MRN: [id number] ?Endoscopist: [name] , MD ?Age: 71 ?Referring MD:  ?Date of Birth: [birth year] ?Gender: Male ?Account #: [id number] ?Procedure:                Upper GI endoscopy ?Indications:              Follow-up for marginal ulcers. ?Medicines:                Monitored Anesthesia Care ?Procedure:                Pre-Anesthesia Assessment: ?                          - Prior to the procedure, a History and Physical  ?                          was performed, and patient medications and  ?                          allergies were reviewed. The patient's tolerance of  ?                          previous anesthesia was also reviewed. The risks  ?                          and benefits of the procedure and the sedation  ?                          options and risks were discussed with the patient.  ?                          All questions were answered, and informed consent  ?                          was obtained. Prior Anticoagulants: The patient has  ?                          taken no previous anticoagulant or antiplatelet  ?                          agents. ASA Grade Assessment: III - A patient with  ?                          severe systemic disease. After reviewing the risks  ?                          and benefits, the patient was deemed in  ?                          satisfactory condition to undergo the procedure. ?                          After obtaining informed consent, the endoscope was  ?  passed under direct vision. Throughout the  ?                          procedure, the patient's blood pressure, pulse, and  ?                          oxygen saturations were monitored continuously. The  ?                          Endoscope was introduced through the mouth, and  ?                          advanced to the afferent and efferent jejunal  ?                          loops. The upper GI endoscopy  was accomplished  ?                          without difficulty. The patient tolerated the  ?                          procedure well. ?Scope In: ?Scope Out: ?Findings:                 The lower third of the esophagus was mildly  ?                          tortuous. ?                          The entire examined stomach was normal with mildly  ?                          deformed pylorus. ?                          There was evidence of a patent pylorus-sparing  ?                          Whipple in the duodenal bulb. This was  ?                          characterized by healthy appearing mucosa. Complete  ?                          healing of marginal ulcers. ?                          The exam was otherwise without abnormality. ?Complications:            No immediate complications. ?Estimated Blood Loss:     Estimated blood loss: none. ?Impression:               - Complete healing of marginal ulcers. ?                          - Patent pylorus-sparing Whipple, characterized by  ?  healthy appearing mucosa was found in the duodenum. ?                          - The examination was otherwise normal. ?                          - No specimens collected. ?Recommendation:           - Patient has a contact number available for  ?                          emergencies. The signs and symptoms of potential  ?                          delayed complications were discussed with the  ?                          patient. Return to normal activities tomorrow.  ?                          Written discharge instructions were provided to the  ?                          patient. ?                          - Resume previous diet. ?                          - Continue present medications. ?                          - No aspirin, ibuprofen, naproxen, or other  ?                          non-steroidal anti-inflammatory drugs. ?                          - The findings and recommendations were discussed  ?                           with the patient's family. ?[name], MD ?01/02/2022 2:53:27 PM ?This report has been signed electronically. ?

## 2022-01-02 NOTE — Progress Notes (Signed)
Per Dr. Lyndel Safe, pt may continue Voltarn GEL prn and Continue OMEPRAZOLE 40 mg daily also. ? ?No problems noted in the recovery room. maw  ?

## 2022-01-02 NOTE — Op Note (Signed)
[facility] ?Patient Name: Kyle Montoya ?Procedure Date: 01/02/2022 1:53 PM ?MRN: [id number] ?Endoscopist: [name] , MD ?Age: 71 ?Referring MD:  ?Date of Birth: May 16, 1951 ?Gender: Male ?Account #: [id number] ?Procedure:                Colonoscopy ?Indications:              High risk colon cancer surveillance: Personal  ?                          history of significant multiple colonic polyps,  ?                          large descending colon polyp s/p EMR 04/2021 ?Medicines:                Monitored Anesthesia Care ?Procedure:                Pre-Anesthesia Assessment: ?                          - Prior to the procedure, a History and Physical  ?                          was performed, and patient medications and  ?                          allergies were reviewed. The patient's tolerance of  ?                          previous anesthesia was also reviewed. The risks  ?                          and benefits of the procedure and the sedation  ?                          options and risks were discussed with the patient.  ?                          All questions were answered, and informed consent  ?                          was obtained. Prior Anticoagulants: The patient has  ?                          taken no previous anticoagulant or antiplatelet  ?                          agents. ASA Grade Assessment: III - A patient with  ?                          severe systemic disease. After reviewing the risks  ?                          and benefits, the patient was deemed in  ?  satisfactory condition to undergo the procedure. ?                          After obtaining informed consent, the colonoscope  ?                          was passed under direct vision. Throughout the  ?                          procedure, the patient's blood pressure, pulse, and  ?                          oxygen saturations were monitored continuously. The  ?                          Olympus CF-HQ190L  (#[id number]) Colonoscope was  ?                          introduced through the anus and advanced to the the  ?                          cecum, identified by appendiceal orifice and  ?                          ileocecal valve. The colonoscopy was somewhat  ?                          difficult due to significant looping and a tortuous  ?                          colon. Successful completion of the procedure was  ?                          aided by applying abdominal pressure. The patient  ?                          tolerated the procedure well. The quality of the  ?                          bowel preparation was good. The ileocecal valve,  ?                          appendiceal orifice, and rectum were photographed. ?Scope In: 2:16:43 PM ?Scope Out: 2:44:55 PM ?Scope Withdrawal Time: 0 hours 20 minutes 53 seconds  ?Total Procedure Duration: 0 hours 28 minutes 12 seconds  ?Findings:                 Two sessile polyps were found in the proximal  ?                          transverse colon and mid transverse colon. The  ?                          polyps were 4 to 6 mm in  size. These polyps were  ?                          removed with a cold snare. Resection and retrieval  ?                          were complete. ?                          A 10 mm polyp was found in the mid descending  ?                          colon. The polyp was sessile. The polyp was removed  ?                          with a cold snare using piecemeal technique. This  ?                          was adjacent to the previously placed tattoo.  ?                          Resection and retrieval were complete. ?                          Two sessile polyps were found in the mid sigmoid  ?                          colon and distal sigmoid colon. The polyps were 4  ?                          to 6 mm in size. These polyps were removed with a  ?                          cold snare. Resection and retrieval were complete. ?                           Non-bleeding internal hemorrhoids were found during  ?                          retroflexion. The hemorrhoids were small and Grade  ?                          I (internal hemorrhoids that do not prolapse). ?                          The exam was otherwise without abnormality on  ?                          direct and retroflexion views. Few scattered  ?                          diverticula were noted in the sigmoid colon and in  ?  the ascending colon. ?Complications:            No immediate complications. ?Estimated Blood Loss:     Estimated blood loss: none. ?Impression:               - Two 4 to 6 mm polyps in the proximal transverse  ?                          colon and in the mid transverse colon, removed with  ?                          a cold snare. Resected and retrieved. ?                          - Recurrent descending colon polyp s/p piecemeal  ?                          polypectomy (at the site of previous tattoo). ?                          - Two 4 to 6 mm polyps in the mid sigmoid colon and  ?                          in the distal sigmoid colon, removed with a cold  ?                          snare. Resected and retrieved. ?                          - Non-bleeding internal hemorrhoids. ?                          - The examination was otherwise normal on direct  ?                          and retroflexion views. ?Recommendation:           - Patient has a contact number available for  ?                          emergencies. The signs and symptoms of potential  ?                          delayed complications were discussed with the  ?                          patient. Return to normal activities tomorrow.  ?                          Written discharge instructions were provided to the  ?                          patient. ?                          - Resume  previous diet. ?                          - Continue present medications. ?                          - Await pathology  results. ?                          - Repeat colonoscopy in 1 year for surveillance. ?                          - The findings and recommendations were discussed  ?                          with the patient's family. ?[name], MD ?01/02/2022 2:59:42 PM ?This report has been signed electronically. ?

## 2022-01-02 NOTE — Patient Instructions (Addendum)
Handouts were given to your care partner on polyps, hemorrhoids, diverticulosis, and a high fiber diet with liberal fluid intake ?NO ASPIRIN, ASPIRIN CONTAINING PRODUCTS (BC OR GOODY POWDERS) OR NSAIDS (IBUPROFEN, ADVIL, ALEVE, AND MOTRIN); TYLENOL IS OK TO TAKE if needed.  Per Dr. Lyndel Safe, you may use VOLTAREN GEL as needed.  This is an antiinflammatory medication, but since it is a topical, it is ok to use. ?Continue OMEPRAZOLE 40 mg daily. ?You may resume your current medications today. ?Await biopsy results.  May take 1-3 weeks to receive pathology results. ?Repeat colonoscopy for surveillance purposes in 1 year. ?Please call if any questions or concerns. ?  ? ? ?YOU HAD AN ENDOSCOPIC PROCEDURE TODAY AT [facility]:   Refer to the procedure report that was given to you for any specific questions about what was found during the examination.  If the procedure report does not answer your questions, please call your gastroenterologist to clarify.  If you requested that your care partner not be given the details of your procedure findings, then the procedure report has been included in a sealed envelope for you to review at your convenience later. ? ?YOU SHOULD EXPECT: Some feelings of bloating in the abdomen. Passage of more gas than usual.  Walking can help get rid of the air that was put into your GI tract during the procedure and reduce the bloating. If you had a lower endoscopy (such as a colonoscopy or flexible sigmoidoscopy) you may notice spotting of blood in your stool or on the toilet paper. If you underwent a bowel prep for your procedure, you may not have a normal bowel movement for a few days. ? ?Please Note:  You might notice some irritation and congestion in your nose or some drainage.  This is from the oxygen used during your procedure.  There is no need for concern and it should clear up in a day or so. ? ?SYMPTOMS TO REPORT IMMEDIATELY: ? ?Following lower endoscopy (colonoscopy or  flexible sigmoidoscopy): ? Excessive amounts of blood in the stool ? Significant tenderness or worsening of abdominal pains ? Swelling of the abdomen that is new, acute ? Fever of 100?F or higher ? ?Following upper endoscopy (EGD) ? Vomiting of blood or coffee ground material ? New chest pain or pain under the shoulder blades ? Painful or persistently difficult swallowing ? New shortness of breath ? Fever of 100?F or higher ? Black, tarry-looking stools ? ?For urgent or emergent issues, a gastroenterologist can be reached at any hour by calling [phone]. ?Do not use MyChart messaging for urgent concerns.  ? ? ?DIET:  We do recommend a small meal at first, but then you may proceed to your regular diet.  Drink plenty of fluids but you should avoid alcoholic beverages for 24 hours. ? ?ACTIVITY:  You should plan to take it easy for the rest of today and you should NOT DRIVE or use heavy machinery until tomorrow (because of the sedation medicines used during the test).   ? ?FOLLOW UP: ?Our staff will call the number listed on your records 48-72 hours following your procedure to check on you and address any questions or concerns that you may have regarding the information given to you following your procedure. If we do not reach you, we will leave a message.  We will attempt to reach you two times.  During this call, we will ask if you have developed any symptoms of COVID 19. If you develop  any symptoms (ie: fever, flu-like symptoms, shortness of breath, cough etc.) before then, please call [phone].  If you test positive for Covid 19 in the 2 weeks post procedure, please call and report this information to [place].   ? ?If any biopsies were taken you will be contacted by phone or by letter within the next 1-3 weeks.  Please call us at [phone] if you have not heard about the biopsies in 3 weeks.  ? ? ?SIGNATURES/CONFIDENTIALITY: ?You and/or your care partner have signed paperwork which will be entered into  your electronic medical record.  These signatures attest to the fact that that the information above on your After Visit Summary has been reviewed and is understood.  Full responsibility of the confidentiality of this discharge information lies with you and/or your care-partner.  ?

## 2022-01-02 NOTE — Progress Notes (Signed)
Called to room to assist during endoscopic procedure.  Patient ID and intended procedure confirmed with present staff. Received instructions for my participation in the procedure from the performing physician.  

## 2022-01-02 NOTE — Progress Notes (Signed)
Pt's states no medical or surgical changes since previsit or office visit. 

## 2022-01-02 NOTE — Progress Notes (Signed)
? ? ?Chief Complaint: FU ? ?Referring Provider:  Barnetta Chapel, NP    ? ? ?ASSESSMENT AND PLAN;  ? ?#1. Multiple advanced colon polyps as below ? ?#2. Marginal ulcers. Neg HP ? ?#3. H/O cholangiocarcinoma (stage 1A) S/P Whipple at [facility] 2014 (Dr Vernard Gambles).  No recurrence on CT chest/abdomen/pelvis at [facility] Nov 2019. Nl LFTs including TB as below. ? ?#4.  Comorbid conditions include CML in remission, H/O Stage 1A non-small cell lung CA s/p LUL lung resection 10/2014, chronic back pain requiring narcotics. COPD with continued smoking, CAD, HTN, HLD ? ?#5.  B12 deficiency on B12 supplements. ? ?Plan: ?-EGD/colon with 2 day prep for further evaluation March or April 2023 ?-Genetic referral for possible ?attenuated FAP in addition to several other malignancies ([place]) ?-Continue omeprazole 40mg  po qd. ? ? ?I discussed EGD/Colonoscopy- the indications, risks, alternatives and potential complications including, but not limited to, bleeding, infection, reaction to medication, damage to internal organs, cardiac and/or pulmonary problems, and perforation requiring surgery (1 to 2 in 1000). The possibility that significant findings could be missed was explained. All ? were answered. The patient gives consent to proceed. ? ?HPI:   ? ?Kyle Hong Sr. is a 71 y.o. male  ? ?For follow-up visit. ?Doing much better from GI standpoint ?Denies having any further epigastric pain ever since he has been on omeprazole. ? ?Followed closely by Dr. Alta Corning. ? ?Had negative CT chest Abdo/pelvis in October 2022 for any recurrence.  Plan is to check CA 19-9, CEA level in 12 weeks. ? ?Initially thought his dad had colon cancer.  Now it is clear, he had prostate cancer. ? ?No melena or hematochezia.  No jaundice dark urine or pale stools. ? ?No fever or chills. ?  ? ? ?Previous GI work-up: ? ?Colon 04/30/2021 Dr. Rush Landmark ?- Hemorrhoids found on digital rectal exam. ?- There was significant looping of the colon. ?-  Stool in the entire examined colon - lavaged with adequate visualization. ?- 13, 2 to 10 mm polyps in the sigmoid colon, in the descending colon, in the transverse ?colon, in the ascending colon and in the cecum, removed with a cold snare. Resected and ?retrieved. Bx- TAs ?- One 35 mm polyp in the descending colon, removed with mucosal resection. Resected ?and retrieved. Treated with a hot snare. Clips (MR conditional) were placed. Bx- TAs ?- Normal mucosa in the entire examined colon. ?- Non-bleeding non-thrombosed external and internal hemorrhoids. ? ?EGD 04/30/2021 Dr. Rush Landmark ?- No gross lesions in esophagus. Tortuous esophagus. ?- Gastritis. Biopsied. ?- Patent duodenoenterostomy, characterized by healthy appearing mucosa was found in the ?bulb. ?- Non-bleeding jejunal ulcers with a clean ulcer base (Forrest Class III) in presumed ?efferent limb (though unclear as unable to visualize ?choledochoduodenostomy/choledochojejunostomy).. ?- Normal mucosa was found in the afferent and efferent jejunum otherwise. Biopsied. ? ?Colon 01/25/2021 ?-Multiple colonic polyps (22) s/p polypectomy. Bx- TAs ?-One 20 mm flat sessile polyp in the proximal descending colon. Biopsied. Tattooed. ?-Mild pancolonic diverticulosis ?-Non-bleeding internal hemorrhoids. ?-The examination was otherwise normal on direct and retroflexion views. ?Past Medical History:  ?Diagnosis Date  ? Arthritis   ? on PRN meds  ? Asthma   ? uses inhaler  ? BPH (benign prostatic hyperplasia)   ? CAD (coronary artery disease)   ? Cataract   ? bilateral sx  ? Cholangiocarcinoma of biliary tract ([place])   ? Chronic myelogenous leukemia ([place])   ? COPD (chronic obstructive pulmonary disease) ([place])   ? uses inhaler  ?  DJD (degenerative joint disease)   ? Dyslipidemia   ? Family history of colonic polyps   ? Family history of prostate cancer   ? Gout   ? HTN (hypertension)   ? on meds  ? Hypercholesterolemia   ? Lung cancer ([place])   ? Myelogenous leukemia ([place])   ? OA  (osteoarthritis)   ? PAD (peripheral artery disease) ([place])   ? Personal history of colonic polyps   ? Renal insufficiency   ? Tendonitis   ? patellar  ? ? ?Past Surgical History:  ?Procedure Laterality Date  ? BIOPSY  04/30/2021  ? Procedure: BIOPSY;  Surgeon: Irving Copas., MD;  Location: [facility];  Service: Gastroenterology;;  ? Bowel duct surg  2014  ? Bowel cancer  ? CARDIAC CATHETERIZATION  2000  ? CHOLECYSTECTOMY    ? COLONOSCOPY  06/23/2015  ? mild sigmoid diverticulosis. small external and internal hemorrhoids. otherwise normal colonoscopy to terminal ileum  ? COLONOSCOPY WITH PROPOFOL N/A 04/30/2021  ? Procedure: COLONOSCOPY WITH PROPOFOL;  Surgeon: Mansouraty, Telford Nab., MD;  Location: [facility];  Service: Gastroenterology;  Laterality: N/A;  ? ENDOSCOPIC MUCOSAL RESECTION N/A 04/30/2021  ? Procedure: ENDOSCOPIC MUCOSAL RESECTION;  Surgeon: Rush Landmark Telford Nab., MD;  Location: [facility];  Service: Gastroenterology;  Laterality: N/A;  ? ESOPHAGOGASTRODUODENOSCOPY (EGD) WITH PROPOFOL N/A 04/30/2021  ? Procedure: ESOPHAGOGASTRODUODENOSCOPY (EGD) WITH PROPOFOL;  Surgeon: Rush Landmark Telford Nab., MD;  Location: [facility];  Service: Gastroenterology;  Laterality: N/A;  ? HEMOSTASIS CLIP PLACEMENT  04/30/2021  ? Procedure: HEMOSTASIS CLIP PLACEMENT;  Surgeon: Irving Copas., MD;  Location: [facility];  Service: Gastroenterology;;  ? LUNG CANCER SURGERY  Jan. 2016  ? partial lobectomy  ? POLYPECTOMY  04/30/2021  ? Procedure: POLYPECTOMY;  Surgeon: Mansouraty, Telford Nab., MD;  Location: [facility];  Service: Gastroenterology;;  ? SHOULDER OPEN ROTATOR CUFF REPAIR Right 09/27/2020  ? Procedure: Right shoulder mini open rotator cuff repair, distal clavicle resection;  Surgeon: Susa Day, MD;  Location: [facility];  Service: Orthopedics;  Laterality: Right;  90 mins ? ?Choice with Block anesthesia  ? SUBMUCOSAL LIFTING INJECTION  04/30/2021  ? Procedure: SUBMUCOSAL LIFTING INJECTION;   Surgeon: Irving Copas., MD;  Location: [facility];  Service: Gastroenterology;;  ? wipple procedure    ? stage I- due to bile duct cancer-2014 at [facility] (Dr Vernard Gambles). Stage IA cholangiocarcinoma  ? ? ?Family History  ?Problem Relation Age of Onset  ? Hypertension Mother   ? Prostate cancer Father 59  ?     deceased  ? Hypertension Sister   ? Asthma Sister   ? Hypertension Brother   ? Cancer Half-Sister   ?     unknown, dx 66  ? Esophageal cancer Neg Hx   ? Colon polyps Neg Hx   ? Colon cancer Neg Hx   ? Stomach cancer Neg Hx   ? Rectal cancer Neg Hx   ? Inflammatory bowel disease Neg Hx   ? Liver disease Neg Hx   ? Pancreatic cancer Neg Hx   ? ? ?Social History  ? ?Tobacco Use  ? Smoking status: Former  ?  Packs/day: 0.25  ?  Years: 40.00  ?  Pack years: 10.00  ?  Types: Cigarettes  ? Smokeless tobacco: Never  ? Tobacco comments:  ?  smokes 1-2 cigarettes at night when drinking 07/02/16. Quit 4 months ago  ?Vaping Use  ? Vaping Use: Never used  ?Substance Use Topics  ? Alcohol use: Yes  ?  Alcohol/week: 6.0 standard drinks  ?  Types: 6 Standard drinks or equivalent per week  ? Drug use: Never  ? ? ?Current Outpatient Medications  ?Medication Sig Dispense Refill  ? albuterol (PROVENTIL HFA;VENTOLIN HFA) 108 (90 BASE) MCG/ACT inhaler Inhale 2 puffs into the lungs every 6 (six) hours as needed for wheezing or shortness of breath.    ? allopurinol (ZYLOPRIM) 300 MG tablet Take 300 mg by mouth daily.    ? Calcium Carbonate (CALCIUM 500 PO) Take 1 tablet by mouth in the morning and at bedtime.    ? Cholecalciferol (VITAMIN D) 50 MCG (2000 UT) tablet Take 2,000 Units by mouth daily.    ? cyclobenzaprine (FLEXERIL) 5 MG tablet Take 5 mg by mouth at bedtime as needed for muscle spasms.    ? fluticasone (FLONASE) 50 MCG/ACT nasal spray Place 2 sprays into both nostrils daily.    ? HYDROcodone-acetaminophen (NORCO) 7.5-325 MG tablet Take 1 tablet by mouth 2 (two) times daily.    ? irbesartan-hydrochlorothiazide  (AVALIDE) 150-12.5 MG tablet Take 1 tablet by mouth daily.    ? omeprazole (PRILOSEC) 40 MG capsule Take 1 capsule (40 mg total) by mouth daily. 60 capsule 4  ? SPRYCEL 100 MG tablet TAKE 1 TABLET BY MOUTH  DAILY 30

## 2022-01-04 ENCOUNTER — Telehealth: Payer: Self-pay | Admitting: *Deleted

## 2022-01-04 NOTE — Telephone Encounter (Signed)
First attempt, left VM.  

## 2022-01-08 ENCOUNTER — Encounter: Payer: Self-pay | Admitting: Gastroenterology

## 2022-01-25 DIAGNOSIS — D513 Other dietary vitamin B12 deficiency anemia: Secondary | ICD-10-CM | POA: Diagnosis not present

## 2022-02-22 NOTE — Progress Notes (Cosign Needed)
[facility]  [address] [place],  [place]  [address] [phone]  Clinic Day:  02/27/2022  Referring physician: Barnetta Chapel, NP  ASSESSMENT & PLAN:   Assessment & Plan: CML (chronic myelocytic leukemia) ([place]) CML controlled with dasatinib 100 mg daily.  He had a mild relapse in May 2018, but by that August, the testing returned to complete molecular remission and he has remained in complete molecular remission since that time. PCR for BCR/ABL is pending from today. He knows to continue dasatinib daily.  We will plan to see him back in 3 months with a CBC, comprehensive metabolic panel and PCR for BCR/ABL.  History of colon polyps His last colonoscopy in April revealed multiple tubular adenomas.  Due to his history of multiple colon polyps he met with the genetic counselor in January.  He agreed to testing for hereditary cancer syndromes, but did not come in for his blood draw.  He is not sure if he wishes to proceed with this testing at this time.  Abnormal transaminases New elevation of liver transaminases.  He has been drinking 1 pint of bourbon a day for the past several months, so this is the most likely cause of the elevated transaminases.  However, due to his history of cholangiocarcinoma, I recommend CT abdomen and pelvis at this time.   Hypocalcemia He has recurrent hypocalcemia despite oral supplementation.  A magnesium level was normal.  I will have him increase his calcium supplement to 3 times daily.  I will also add a vitamin D level today.   If all is well with his CT abdomen/pelvis, we will plan to see him back in 3 months as above.  The patient understands the plans discussed today and is in agreement with them.  He knows to contact our office if he develops concerns prior to his next appointment.   I provided 20 minutes of face-to-face time during this encounter and > 50% was spent counseling as documented under my assessment and  plan.    Marvia Pickles, PA-C  [facility] AT [facility] [address] [place] [address] Dept: [phone] Dept Fax: [phone]   Orders Placed This Encounter  Procedures   CT ABDOMEN PELVIS W CONTRAST    Standing Status:   Future    Standing Expiration Date:   02/28/2023    Order Specific Question:   If indicated for the ordered procedure, I authorize the administration of contrast media per Radiology protocol    Answer:   Yes    Order Specific Question:   Preferred imaging location?    Answer:   External    Comments:   RE    Order Specific Question:   Radiology Contrast Protocol - do NOT remove file path    Answer:   [email or web address]   CT CHEST W CONTRAST    Standing Status:   Future    Standing Expiration Date:   02/28/2023    Order Specific Question:   If indicated for the ordered procedure, I authorize the administration of contrast media per Radiology protocol    Answer:   Yes    Order Specific Question:   Preferred imaging location?    Answer:   External    Comments:   [facility]    Order Specific Question:   Radiology Contrast Protocol - do NOT remove file path    Answer:   [email or web address]   CBC and differential  This external order was created through the Results Console.   CBC    This external order was created through the Results Console.   CBC    This order was created through External Result Entry   Magnesium    Standing Status:   Future    Number of Occurrences:   1    Standing Expiration Date:   10/30/7822   Basic metabolic panel    This external order was created through the Results Console.   Comprehensive metabolic panel    This external order was created through the Results Console.   Hepatic function panel    This external order was created through the Results Console.   VITAMIN D 25 Hydroxy (Vit-D Deficiency, Fractures)     Standing Status:   Future    Standing Expiration Date:   02/28/2023   Cancer antigen 19-9    Standing Status:   Future    Standing Expiration Date:   02/28/2023   CEA    Standing Status:   Future    Standing Expiration Date:   02/28/2023      CHIEF COMPLAINT:  CC: Chronic myelogenous leukemia  Current Treatment: Dasatinib 100 mg daily  HISTORY OF PRESENT ILLNESS:  71 year old with chronic myelogenous leukemia diagnosed in October 2015 when he presented with leukocytosis.  He was originally placed on nilotinib, but developed a severe pruritic rash, so was switched to dasatinib 100 mg daily.  He achieved a major molecular remission by May 2016.   He also has a history of a stage IA cholangiocarcinoma, treated with surgical resection in June 2014.   He then underwent surgical resection of a stage IA (T1 N0 M0) non-small cell lung cancer in February 2016.  Pathology revealed a 3 cm, well-differentiated, adenocarcinoma with negative nodes.  Adjuvant chemotherapy for his lung cancer was not recommended.  He has chronic back pain and continues seeing spine specialist.  He apparently has significant degenerative disc disease.  He has also had upper abdominal pain, which he attributes to dasatinib.  He uses hydrocodone/APAP as needed for his pain.  In May 2018, PCR was positive at 0.012%, consistent with residual CML.  Repeat PCR in August 2018 revealed a major molecular response.  His CML has remained in remission since that time.  CT chest, abdomen and pelvis in November 2019 did not reveal evidence of recurrence of either malignancy.     CT chest in August 2020 did not reveal any evidence of recurrent lung malignancy.  He has had hypocalcemia, so was instructed to take calcium 600 mg twice daily.  When he was seen in November 2020, had been having lower leg pain worsening throughout the day.  He underwent arterial ultrasound/ankle brachial indices, which was normal.  The pain was then felt to possibly be  degenerative in nature.  He reported constipation due to the calcium and as his on calcium was normal at that time,  we decreased the calcium to daily.  He had worsening neutropenia and was found to have B12 and placed on B12 injections weekly for 4 weeks, then monthly.  Serum folate was normal.  He had persistent abdominal pain, so was referred to Dr. Lyndel Safe.  He was seen by Dr. Lyndel Safe in 2021 and had 2 gastric ulcers and H. Pylori infection, which was treated.  He saw Dr. Nila Nephew earlier in 2021 as well, and PSA was normal. When he was seen in August, he had recurrent hypocalcemia, with a calcium of 8.3.  He could not tolerate the calcium supplement, so we recommended that he take Tums 4 times daily.  He continued to complain of intermittent abdominal pain, which we have attributed to adhesions.  He has had rotator cuff surgery in January 2022.  He had a colonoscopy in May 2022 by Dr. Lyndel Safe with findings of a large polyp which was resected via colonoscopy in August 2022.  He had EGD in August which revealed non-bleeding jejunal ulcers.  He was placed on omeprazole 40 mg twice daily.  His abdominal pain resolved with omeprazole.  PCR for BCR/ABL has continued to be negative.   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[employer])  02/15/2013 Initial Diagnosis   Carcinoma of biliary tract ([place])    03/06/2013 Cancer Staging   Staging form: Distal Bile Ducts, AJCC 7th Edition - Clinical stage from 03/06/2013: Stage IA (T1, N0, M0) - Signed by [name], MD on 11/09/2020 Staged by: Managing physician Diagnostic confirmation: Positive histology Specimen type: Excision Histopathologic type: Cholangiocarcinoma Stage prefix: Initial diagnosis Lymph-vascular invasion (LVI): LVI not present (absent)/not identified Residual tumor (R): R0 - None Stage used in treatment planning: Yes National guidelines used in treatment planning: Yes Type of national guideline used in treatment planning: NCCN     Non-small cell carcinoma of lung ([facility])  11/08/2014 Cancer Staging   Staging form: Lung, AJCC 7th Edition - Clinical stage from 11/08/2014: Stage IA (T1b, N0, M0) - Signed by [name], MD on 11/09/2020 Staged by: Managing physician Diagnostic confirmation: Positive histology Specimen type: Excision Histopathologic type: Adenocarcinoma, NOS Stage prefix: Initial diagnosis Laterality: Left Tumor size (mm): 30 Histologic grade (G): G1 Lymph-vascular invasion (LVI): LVI not present (absent)/not identified Residual tumor (R): R0 - None Pleural/elastic layer invasion: PL0 Prognostic indicators: Resection LUL Stage used in treatment planning: Yes National guidelines used in treatment planning: Yes Type of national guideline used in treatment planning: NCCN    01/31/2015 Initial Diagnosis   Non-small cell carcinoma of lung ([place])        INTERVAL HISTORY:  Kyle Montoya is here today for repeat clinical assessment and states he has been doing fairly well.  He states he continues dasatinib daily.  He denies missed doses.  He denies potential side effects of cough, shortness of breath or chest pain.  His wife has been ill and he is her primary caregiver.  He has been drinking more to cope with this.  He reports drinking 1 pint per day of bourbon for several months.  He is also on hydrocodone/APAP for pain due to degenerative disc disease.  He denies nausea, vomiting, diarrhea, constipation or abdominal pain.  He states he is taking calcium 600 mg twice daily.  He denies fevers or chills. He denies pain. His appetite is good. His weight has increased 1 pounds over last 3 months .  He had genetic counseling in January, but then did not come in for his blood draw.  He had a repeat EGD in April which revealed healed ulcers.  REVIEW OF SYSTEMS:  Review of Systems  Constitutional:  Negative for appetite change, chills, fatigue, fever and unexpected weight change.  HENT:   Negative for lump/mass,  mouth sores and sore throat.   Respiratory:  Negative for cough and shortness of breath.   Cardiovascular:  Negative for chest pain and leg swelling.  Gastrointestinal:  Negative for abdominal pain, constipation, diarrhea, nausea and vomiting.  Genitourinary:  Negative for difficulty urinating, dysuria, frequency and hematuria.   Musculoskeletal:  Negative for arthralgias, back pain and myalgias.  Skin:  Negative for itching, rash and wound.  Neurological:  Negative for dizziness, extremity weakness, headaches, light-headedness and numbness.  Hematological:  Negative for adenopathy.  Psychiatric/Behavioral:  Negative for depression and sleep disturbance. The patient is not nervous/anxious.     VITALS:  Blood pressure (!) 162/88, pulse 74, temperature 98 F (36.7 C), temperature source Oral, resp. rate 20, height _0  (1.778 m), weight 219 lb 1.6 oz (99.4 kg), SpO2 96 %.  Wt Readings from Last 3 Encounters:  02/27/22 219 lb 1.6 oz (99.4 kg)  01/02/22 218 lb (98.9 kg)  12/13/21 218 lb (98.9 kg)    Body mass index is 31.44 kg/m.  Performance status (ECOG): 0 - Asymptomatic  PHYSICAL EXAM:  Physical Exam Vitals and nursing note reviewed.  Constitutional:      General: He is not in acute distress.    Appearance: Normal appearance. He is normal weight.  HENT:     Head: Normocephalic and atraumatic.     Mouth/Throat:     Mouth: Mucous membranes are moist.     Pharynx: Oropharynx is clear. No oropharyngeal exudate or posterior oropharyngeal erythema.  Eyes:     General: No scleral icterus.    Extraocular Movements: Extraocular movements intact.     Conjunctiva/sclera: Conjunctivae normal.     Pupils: Pupils are equal, round, and reactive to light.  Cardiovascular:     Rate and Rhythm: Normal rate and regular rhythm.     Heart sounds: Normal heart sounds. No murmur heard.   No friction rub. No gallop.  Pulmonary:     Effort: Pulmonary effort is normal.     Breath sounds:  Decreased breath sounds (Throughout the lungs) present. No wheezing, rhonchi or rales.  Abdominal:     General: Bowel sounds are normal. There is no distension.     Palpations: Abdomen is soft. There is no hepatomegaly, splenomegaly or mass.     Tenderness: There is no abdominal tenderness.  Musculoskeletal:        General: Normal range of motion.     Cervical back: Normal range of motion and neck supple. No tenderness.     Right lower leg: No edema.     Left lower leg: No edema.  Lymphadenopathy:     Cervical: No cervical adenopathy.     Upper Body:     Right upper body: No supraclavicular or axillary adenopathy.     Left upper body: No supraclavicular or axillary adenopathy.     Lower Body: No right inguinal adenopathy. No left inguinal adenopathy.  Skin:    General: Skin is warm and dry.     Coloration: Skin is not jaundiced.     Findings: No rash.  Neurological:     Mental Status: He is alert and oriented to person, place, and time.     Cranial Nerves: No cranial nerve deficit.  Psychiatric:        Mood and Affect: Mood normal.        Behavior: Behavior normal.        Thought Content: Thought content normal.    LABS:      Latest Ref Rng & Units 02/27/2022   12:00 AM 11/27/2021   12:00 AM 08/27/2021   12:00 AM  CBC  WBC  4.0      5.5   7.0       Hemoglobin 13.5 - 17.5 13.7      13.5   14.2  Hematocrit 41 - 53 41      38   40       Platelets 150 - 400 K/uL 221      223   213          This result is from an external source.      Latest Ref Rng & Units 02/27/2022   12:00 AM 11/27/2021   12:00 AM 08/27/2021    3:26 PM  CMP  BUN 4 - _0 Creatinine 0.6 - 1.3 0.6      0.7     Sodium 137 - 147 137      133     Potassium 3.5 - 5.1 mEq/L 3.9      3.7     Chloride 99 - 108 102      101     CO2 13 - _1 Calcium 8.7 - 10.7 7.9      8.4   8.7    Alkaline Phos 25 - 125 70      54     AST 14 - 40 98      55     ALT 10 - 40 U/L 51      36         This result is from an external source.     Lab Results  Component Value Date   CEA1 2.7 11/27/2021   /  CEA  Date Value Ref Range Status  11/27/2021 2.7 0.0 - 4.7 ng/mL Final    Comment:    (NOTE)                             Nonsmokers          <3.9                             Smokers             <5.6 Roche [employer] Electrochemiluminescence Immunoassay (ECLIA) Values obtained with different assay methods or kits cannot be used interchangeably.  Results cannot be interpreted as absolute evidence of the presence or absence of malignant disease. Performed At: [facility] [place], [place] [id number] Rush Farmer MD UO:[id number]    No results found for: PSA1 Lab Results  Component Value Date   EXM147 12 11/27/2021   No results found for: WLK957  No results found for: TOTALPROTELP, ALBUMINELP, A1GS, A2GS, BETS, BETA2SER, GAMS, MSPIKE, SPEI No results found for: TIBC, FERRITIN, IRONPCTSAT No results found for: LDH  STUDIES:  No results found.    HISTORY:   Past Medical History:  Diagnosis Date   Abnormal transaminases 02/27/2022   Arthritis    on PRN meds   Asthma    uses inhaler   BPH (benign prostatic hyperplasia)    CAD (coronary artery disease)    Cataract    bilateral sx   Cholangiocarcinoma of biliary tract ([facility])    Chronic myelogenous leukemia ([facility])    COPD (chronic obstructive pulmonary disease) ([facility])    uses inhaler   DJD (degenerative joint disease)    Dyslipidemia    Family history of colonic polyps    Family history of prostate cancer    Gout    HTN (hypertension)    on meds  Hypercholesterolemia    Lung cancer ([facility])    Myelogenous leukemia ([facility])    OA (osteoarthritis)    PAD (peripheral artery disease) ([facility])    Personal history of colonic polyps    Renal insufficiency    Tendonitis    patellar    Past Surgical History:  Procedure Laterality Date   BIOPSY  04/30/2021   Procedure: BIOPSY;  Surgeon:  Rush Landmark, Telford Nab., MD;  Location: [facility];  Service: Gastroenterology;;   Bowel duct surg  2014   Bowel cancer   CARDIAC CATHETERIZATION  2000   CHOLECYSTECTOMY     COLONOSCOPY  06/23/2015   mild sigmoid diverticulosis. small external and internal hemorrhoids. otherwise normal colonoscopy to terminal ileum   COLONOSCOPY WITH PROPOFOL N/A 04/30/2021   Procedure: COLONOSCOPY WITH PROPOFOL;  Surgeon: Rush Landmark Telford Nab., MD;  Location: [facility];  Service: Gastroenterology;  Laterality: N/A;   ENDOSCOPIC MUCOSAL RESECTION N/A 04/30/2021   Procedure: ENDOSCOPIC MUCOSAL RESECTION;  Surgeon: Rush Landmark Telford Nab., MD;  Location: [facility];  Service: Gastroenterology;  Laterality: N/A;   ESOPHAGOGASTRODUODENOSCOPY (EGD) WITH PROPOFOL N/A 04/30/2021   Procedure: ESOPHAGOGASTRODUODENOSCOPY (EGD) WITH PROPOFOL;  Surgeon: Rush Landmark Telford Nab., MD;  Location: [facility];  Service: Gastroenterology;  Laterality: N/A;   HEMOSTASIS CLIP PLACEMENT  04/30/2021   Procedure: HEMOSTASIS CLIP PLACEMENT;  Surgeon: Irving Copas., MD;  Location: [facility];  Service: Gastroenterology;;   LUNG CANCER SURGERY  Jan. 2016   partial lobectomy   POLYPECTOMY  04/30/2021   Procedure: POLYPECTOMY;  Surgeon: Mansouraty, Telford Nab., MD;  Location: [facility];  Service: Gastroenterology;;   SHOULDER OPEN ROTATOR CUFF REPAIR Right 09/27/2020   Procedure: Right shoulder mini open rotator cuff repair, distal clavicle resection;  Surgeon: Susa Day, MD;  Location: [facility];  Service: Orthopedics;  Laterality: Right;  90 mins  Choice with Block anesthesia   SUBMUCOSAL LIFTING INJECTION  04/30/2021   Procedure: SUBMUCOSAL LIFTING INJECTION;  Surgeon: Irving Copas., MD;  Location: [facility];  Service: Gastroenterology;;   wipple procedure     stage I- due to bile duct cancer-2014 at [facility] (Dr Vernard Gambles). Stage IA cholangiocarcinoma    Family History  Problem Relation Age of Onset    Hypertension Mother    Prostate cancer Father 80       deceased   Hypertension Sister    Asthma Sister    Hypertension Brother    Cancer Half-Sister        unknown, dx 69   Esophageal cancer Neg Hx    Colon polyps Neg Hx    Colon cancer Neg Hx    Stomach cancer Neg Hx    Rectal cancer Neg Hx    Inflammatory bowel disease Neg Hx    Liver disease Neg Hx    Pancreatic cancer Neg Hx     Social History:  reports that he has quit smoking. His smoking use included cigarettes. He has a 10.00 pack-year smoking history. He has never used smokeless tobacco. He reports current alcohol use of about 6.0 standard drinks per week. He reports that he does not use drugs.The patient is alone today.  Allergies:  Allergies  Allergen Reactions   Pneumococcal Vaccines Swelling    Current Medications: Current Outpatient Medications  Medication Sig Dispense Refill   albuterol (PROVENTIL HFA;VENTOLIN HFA) 108 (90 BASE) MCG/ACT inhaler Inhale 2 puffs into the lungs every 6 (six) hours as needed for wheezing or shortness of breath.     allopurinol (ZYLOPRIM) 300 MG tablet Take 300  mg by mouth daily.     Calcium Carbonate (CALCIUM 500 PO) Take 1 tablet by mouth in the morning and at bedtime.     Cholecalciferol (VITAMIN D) 50 MCG (2000 UT) tablet Take 2,000 Units by mouth daily.     cyclobenzaprine (FLEXERIL) 5 MG tablet Take 5 mg by mouth at bedtime as needed for muscle spasms.     diclofenac Sodium (VOLTAREN) 1 % GEL Apply 2 g topically at bedtime.     docusate sodium (COLACE) 100 MG capsule Take 200 mg by mouth daily.     fluticasone (FLONASE) 50 MCG/ACT nasal spray Place 2 sprays into both nostrils daily.     HYDROcodone-acetaminophen (NORCO) 7.5-325 MG tablet Take 1 tablet by mouth 2 (two) times daily.     HYDROcodone-acetaminophen (NORCO/VICODIN) 5-325 MG tablet Take 1 tablet by mouth 4 (four) times daily as needed.     irbesartan-hydrochlorothiazide (AVALIDE) 150-12.5 MG tablet Take 1 tablet by  mouth daily.     methocarbamol (ROBAXIN) 500 MG tablet Take 500 mg by mouth daily.     omeprazole (PRILOSEC) 40 MG capsule Take 1 capsule (40 mg total) by mouth daily. 60 capsule 4   SPRYCEL 100 MG tablet TAKE 1 TABLET BY MOUTH  DAILY 30 tablet 5   tamsulosin (FLOMAX) 0.4 MG CAPS capsule Take 0.8 mg by mouth daily.     TRELEGY ELLIPTA 100-62.5-25 MCG/INH AEPB Inhale 1 puff into the lungs daily at 6 (six) AM.     No current facility-administered medications for this visit.

## 2022-02-22 NOTE — Assessment & Plan Note (Addendum)
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 He had a mild relapse in May 2018, but by that August, the testing returned to complete molecular remission and he has remained in complete molecular remission since that time. PCR for BCR/ABL is pending from today.He knows to continue dasatinib daily.  We will plan to see him back in 3 months with a CBC, comprehensive metabolic panel and PCR for BCR/ABL.

## 2022-02-26 DIAGNOSIS — M150 Primary generalized (osteo)arthritis: Secondary | ICD-10-CM | POA: Diagnosis not present

## 2022-02-26 DIAGNOSIS — M47812 Spondylosis without myelopathy or radiculopathy, cervical region: Secondary | ICD-10-CM | POA: Diagnosis not present

## 2022-02-26 DIAGNOSIS — G894 Chronic pain syndrome: Secondary | ICD-10-CM | POA: Diagnosis not present

## 2022-02-26 DIAGNOSIS — M47816 Spondylosis without myelopathy or radiculopathy, lumbar region: Secondary | ICD-10-CM | POA: Diagnosis not present

## 2022-02-27 ENCOUNTER — Inpatient Hospital Stay: Payer: BC Managed Care – PPO | Attending: Hematology and Oncology | Admitting: Hematology and Oncology

## 2022-02-27 ENCOUNTER — Other Ambulatory Visit: Payer: Self-pay

## 2022-02-27 ENCOUNTER — Encounter: Payer: Self-pay | Admitting: Hematology and Oncology

## 2022-02-27 ENCOUNTER — Inpatient Hospital Stay: Payer: BC Managed Care – PPO

## 2022-02-27 DIAGNOSIS — R748 Abnormal levels of other serum enzymes: Secondary | ICD-10-CM | POA: Diagnosis not present

## 2022-02-27 DIAGNOSIS — I712 Thoracic aortic aneurysm, without rupture, unspecified: Secondary | ICD-10-CM

## 2022-02-27 DIAGNOSIS — C249 Malignant neoplasm of biliary tract, unspecified: Secondary | ICD-10-CM

## 2022-02-27 DIAGNOSIS — C9210 Chronic myeloid leukemia, BCR/ABL-positive, not having achieved remission: Secondary | ICD-10-CM | POA: Diagnosis not present

## 2022-02-27 DIAGNOSIS — C3490 Malignant neoplasm of unspecified part of unspecified bronchus or lung: Secondary | ICD-10-CM | POA: Diagnosis not present

## 2022-02-27 DIAGNOSIS — Z86010 Personal history of colonic polyps: Secondary | ICD-10-CM | POA: Insufficient documentation

## 2022-02-27 DIAGNOSIS — R7401 Elevation of levels of liver transaminase levels: Secondary | ICD-10-CM | POA: Diagnosis not present

## 2022-02-27 HISTORY — DX: Abnormal levels of other serum enzymes: R74.8

## 2022-02-27 LAB — CBC AND DIFFERENTIAL
HCT: 41 (ref 41–53)
Hemoglobin: 13.7 (ref 13.5–17.5)
Neutrophils Absolute: 1.6
Platelets: 221 10*3/uL (ref 150–400)
WBC: 4

## 2022-02-27 LAB — BASIC METABOLIC PANEL
BUN: 8 (ref 4–21)
CO2: 25 — AB (ref 13–22)
Chloride: 102 (ref 99–108)
Creatinine: 0.6 (ref 0.6–1.3)
Glucose: 86
Potassium: 3.9 mEq/L (ref 3.5–5.1)
Sodium: 137 (ref 137–147)

## 2022-02-27 LAB — VITAMIN D 25 HYDROXY (VIT D DEFICIENCY, FRACTURES): Vit D, 25-Hydroxy: 52.13 ng/mL (ref 30–100)

## 2022-02-27 LAB — HEPATIC FUNCTION PANEL
ALT: 51 U/L — AB (ref 10–40)
AST: 98 — AB (ref 14–40)
Alkaline Phosphatase: 70 (ref 25–125)
Bilirubin, Total: 0.4

## 2022-02-27 LAB — CBC
MCV: 101 — AB (ref 80–94)
RBC: 4.05 (ref 3.87–5.11)

## 2022-02-27 LAB — COMPREHENSIVE METABOLIC PANEL
Albumin: 3.9 (ref 3.5–5.0)
Calcium: 7.9 — AB (ref 8.7–10.7)

## 2022-02-27 NOTE — Assessment & Plan Note (Signed)
He has recurrent hypocalcemia despite oral supplementation.  A magnesium level was normal.  I will have him increase his calcium supplement to 3 times daily.  I will also add a vitamin D level today.

## 2022-02-27 NOTE — Assessment & Plan Note (Addendum)
New elevation of liver transaminases.  He has been drinking 1 pint of bourbon a day for the past several months, so this is the most likely cause of the elevated transaminases.  However, due to his history of cholangiocarcinoma, I recommend CT abdomen and pelvis at this time.

## 2022-02-27 NOTE — Assessment & Plan Note (Addendum)
His last colonoscopy in April revealed multiple tubular adenomas.  Due to his history of multiple colon polyps he met with the genetic counselor in January.  He agreed to testing for hereditary cancer syndromes, but did not come in for his blood draw.  He is not sure if he wishes to proceed with this testing at this time.

## 2022-02-28 LAB — CANCER ANTIGEN 19-9: CA 19-9: 12 U/mL (ref 0–35)

## 2022-02-28 LAB — CEA: CEA: 4 ng/mL (ref 0.0–4.7)

## 2022-03-06 DIAGNOSIS — D3502 Benign neoplasm of left adrenal gland: Secondary | ICD-10-CM | POA: Diagnosis not present

## 2022-03-06 DIAGNOSIS — R748 Abnormal levels of other serum enzymes: Secondary | ICD-10-CM | POA: Diagnosis not present

## 2022-03-06 DIAGNOSIS — D3500 Benign neoplasm of unspecified adrenal gland: Secondary | ICD-10-CM | POA: Diagnosis not present

## 2022-03-06 DIAGNOSIS — I700 Atherosclerosis of aorta: Secondary | ICD-10-CM | POA: Diagnosis not present

## 2022-03-07 LAB — BCR-ABL1, CML/ALL, PCR, QUANT: Interpretation (BCRAL):: NEGATIVE

## 2022-03-08 ENCOUNTER — Encounter: Payer: Self-pay | Admitting: Hematology and Oncology

## 2022-03-14 ENCOUNTER — Telehealth: Payer: Self-pay

## 2022-03-14 NOTE — Telephone Encounter (Signed)
-----   Message from [name], MD sent at 03/13/2022  8:25 PM EDT ----- Regarding: call Tell him leukemia remains in remission

## 2022-03-14 NOTE — Telephone Encounter (Signed)
Patient notified

## 2022-04-05 DIAGNOSIS — D519 Vitamin B12 deficiency anemia, unspecified: Secondary | ICD-10-CM | POA: Diagnosis not present

## 2022-04-05 DIAGNOSIS — H18413 Arcus senilis, bilateral: Secondary | ICD-10-CM | POA: Insufficient documentation

## 2022-04-05 DIAGNOSIS — C9591 Leukemia, unspecified, in remission: Secondary | ICD-10-CM | POA: Diagnosis not present

## 2022-04-05 DIAGNOSIS — E559 Vitamin D deficiency, unspecified: Secondary | ICD-10-CM | POA: Diagnosis not present

## 2022-04-05 DIAGNOSIS — I10 Essential (primary) hypertension: Secondary | ICD-10-CM | POA: Diagnosis not present

## 2022-04-15 DIAGNOSIS — I729 Aneurysm of unspecified site: Secondary | ICD-10-CM | POA: Diagnosis not present

## 2022-04-15 DIAGNOSIS — J301 Allergic rhinitis due to pollen: Secondary | ICD-10-CM | POA: Diagnosis not present

## 2022-04-15 DIAGNOSIS — J449 Chronic obstructive pulmonary disease, unspecified: Secondary | ICD-10-CM | POA: Diagnosis not present

## 2022-04-15 DIAGNOSIS — G2581 Restless legs syndrome: Secondary | ICD-10-CM | POA: Diagnosis not present

## 2022-04-16 DIAGNOSIS — M150 Primary generalized (osteo)arthritis: Secondary | ICD-10-CM | POA: Diagnosis not present

## 2022-04-16 DIAGNOSIS — M47816 Spondylosis without myelopathy or radiculopathy, lumbar region: Secondary | ICD-10-CM | POA: Diagnosis not present

## 2022-04-16 DIAGNOSIS — M47812 Spondylosis without myelopathy or radiculopathy, cervical region: Secondary | ICD-10-CM | POA: Diagnosis not present

## 2022-04-16 DIAGNOSIS — G894 Chronic pain syndrome: Secondary | ICD-10-CM | POA: Diagnosis not present

## 2022-05-08 DIAGNOSIS — D513 Other dietary vitamin B12 deficiency anemia: Secondary | ICD-10-CM | POA: Diagnosis not present

## 2022-05-13 DIAGNOSIS — M150 Primary generalized (osteo)arthritis: Secondary | ICD-10-CM | POA: Diagnosis not present

## 2022-05-13 DIAGNOSIS — G894 Chronic pain syndrome: Secondary | ICD-10-CM | POA: Diagnosis not present

## 2022-05-13 DIAGNOSIS — M47816 Spondylosis without myelopathy or radiculopathy, lumbar region: Secondary | ICD-10-CM | POA: Diagnosis not present

## 2022-05-13 DIAGNOSIS — M47812 Spondylosis without myelopathy or radiculopathy, cervical region: Secondary | ICD-10-CM | POA: Diagnosis not present

## 2022-05-17 ENCOUNTER — Telehealth: Payer: Self-pay | Admitting: Hematology and Oncology

## 2022-05-17 NOTE — Telephone Encounter (Signed)
CT Chest Scheduled for 05/29/22 @ 9 am; Checking in @ 8 am  Notified pt of date,time and instructions.

## 2022-05-21 ENCOUNTER — Telehealth: Payer: Self-pay | Admitting: Gastroenterology

## 2022-05-21 NOTE — Telephone Encounter (Signed)
Inbound call from patient stating that he thinks that Proilsec is not working like it was before and is seeking advice if he can get proscription changed from once daily to twice daily. Please advise.

## 2022-05-21 NOTE — Telephone Encounter (Signed)
Pt stated that he was taking his omeprazole at about 10 in the evening and was waking up about 3 in the morning with abdominal pain and taking an additional tablet: Pt was notified that he needed to take the medication as prescribed Once Daily. Pt notified to take medication 30 minutes prior to breakfast:  Pt verbalized understanding with all questions answered.

## 2022-05-28 ENCOUNTER — Other Ambulatory Visit: Payer: Self-pay

## 2022-05-28 NOTE — Progress Notes (Shared)
Kyle Montoya  [address] [place],  [place]  [address] [phone]  Clinic Day:  05/30/2022  Referring physician: Barnetta Chapel, NP   ASSESSMENT & PLAN:   Assessment & Plan: 1. 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  He had a mild relapse in May 2018, but by that August, the testing returned to complete molecular remission and he has remained in complete molecular remission since that time. PCR for BCR/ABL is pending from today. He knows to continue dasatinib daily.    2. History of stage IA non-small cell lung cancer in 2016 treated with surgery alone.  CT imaging in October 2022 did not reveal any evidence of recurrence.    3. History of stage IA cholangiocarcinoma in 2014 treated with surgery alone.  He has stable intermittent abdominal pain, which may be related to dasatinib, but we suspect this could represent adhesions from surgery.  CT imaging in October 2022 did not reveal any evidence of recurrence.   4. Rare tobacco use, but I once again advised him on the importance of complete abstinence from tobacco.    5. Mild hypocalcemia, I advised that he start oral supplement BID.    6. B12 deficiency.  He remains on B12 injections monthly with his primary care office.   7. History of gastric ulcers with H pylori infection.  This was treated appropriately by Dr. Lyndel Montoya and he is on omeprazole.  He does not have symptoms of recurrence.     8.  Right rotator cuff shoulder surgery in early 2022.    He knows to continue dasatinib daily. He knows to start oral calcium BID. We will plan to see him back in 3 months with a CBC, comprehensive metabolic panel and PCR for BCR/ABL.  He will be due for his yearly scans in the fall. The patient understands the plans discussed today and is in agreement with them.  He knows to contact our office if he develops concerns prior to his next appointment.   I provided 15 minutes of face-to-face time during this this  encounter and > 50% was spent counseling as documented under my assessment and plan.    Kyle Montoya  [facility] [address] [place] [address] Dept: [phone] Dept Fax: [phone]   No orders of the defined types were placed in this encounter.     CHIEF COMPLAINT:  CC: Chronic myelogenous leukemia with history of lung cancer and cholangiocarcinoma  Current Treatment: Dasatinib 100 mg daily   HISTORY OF PRESENT ILLNESS:  Chronic myelogenous leukemia diagnosed in October 2015 when he presented with leukocytosis.  He was originally placed on nilotinib, but developed a severe pruritic rash, so was switched to dasatinib 100 mg daily.  He achieved a major molecular remission by May 2016.   He also has a history of a stage IA cholangiocarcinoma, treated with surgical resection in June 2014.   He then underwent surgical resection of a stage IA (T1 N0 M0) non-small cell lung cancer in February 2016.  Pathology revealed a 3 cm, well-differentiated, adenocarcinoma with negative nodes.  Adjuvant chemotherapy for his lung cancer was not recommended.  He has chronic back pain and continues seeing spine specialist.  He apparently has significant degenerative disc disease.  He has also had upper abdominal pain, which he attributes to dasatinib.  He uses hydrocodone/APAP as needed for his pain.  In May 2018, PCR was positive at  0.012%, consistent with residual CML.  Repeat PCR in August 2018 revealed a major molecular response.  His CML has remained in remission since that time.  CT chest, abdomen and pelvis in November 2019 did not reveal evidence of recurrence of either malignancy.     CT chest in August 2020 did not reveal any evidence of recurrent lung malignancy.  He has had hypocalcemia, so was instructed to take calcium 600 mg twice daily.  When he was seen in November 2020, had been having lower leg pain worsening  throughout the day.  He underwent arterial ultrasound/ankle brachial indices, which was normal.  The pain was then felt to possibly be degenerative in nature.  He reported constipation due to the calcium and as his on calcium was normal at that time,  we decreased the calcium to daily.  He had worsening neutropenia and was found to have B12 and placed on B12 injections weekly for 4 weeks, then monthly.  Serum folate was normal.  He had persistent abdominal pain, so was referred to Dr. Lyndel Montoya.  He was seen by Dr. Lyndel Montoya in 2021 and had 2 gastric ulcers and H. Pylori infection, which was treated.  He saw Dr. Nila Montoya earlier in 2021 as well, and PSA was normal. When he was seen in August, he had recurrent hypocalcemia, with a calcium of 8.3.  He could not tolerate the calcium supplement, so we recommended that he take Tums 4 times daily.  He continued to complain of intermittent abdominal pain, which we have attributed to adhesions.  He has had rotator cuff surgery in January 2022.  He has had a colonoscopy by Dr. Lyndel Montoya with findings of a large polyp.  He will be going back on August 8th to have this resected through colonoscopy.  He missed his appointment in May but Dr. Lyndel Montoya told him "his labs were good".  I looked up and his PCR for BCR/ABL continues to be negative on the May reading.  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[place])  02/15/2013 Initial Diagnosis   Carcinoma of biliary tract ([place])   03/06/2013 Cancer Staging   Staging form: Distal Bile Ducts, AJCC 7th Edition - Clinical stage from 03/06/2013: Stage IA (T1, N0, M0) - Signed by Kyle Kaplan, MD on 11/09/2020 Staged by: Managing physician Diagnostic confirmation: Positive histology Specimen type: Excision Histopathologic type: Cholangiocarcinoma Stage prefix: Initial diagnosis Lymph-vascular invasion (LVI): LVI not present (absent)/not identified Residual tumor (R): R0 - None Stage used in treatment planning: Yes National guidelines  used in treatment planning: Yes Type of national guideline used in treatment planning: NCCN   Non-small cell carcinoma of lung ([place])  11/08/2014 Cancer Staging   Staging form: Lung, AJCC 7th Edition - Clinical stage from 11/08/2014: Stage IA (T1b, N0, M0) - Signed by Kyle Kaplan, MD on 11/09/2020 Staged by: Managing physician Diagnostic confirmation: Positive histology Specimen type: Excision Histopathologic type: Adenocarcinoma, NOS Stage prefix: Initial diagnosis Laterality: Left Tumor size (mm): 30 Histologic grade (G): G1 Lymph-vascular invasion (LVI): LVI not present (absent)/not identified Residual tumor (R): R0 - None Pleural/elastic layer invasion: PL0 Prognostic indicators: Resection LUL Stage used in treatment planning: Yes National guidelines used in treatment planning: Yes Type of national guideline used in treatment planning: NCCN   01/31/2015 Initial Diagnosis   Non-small cell carcinoma of lung ([facility])      INTERVAL HISTORY:  Jolan is here for routine follow up and states that he has been well and denies complaints.  His abdominal pain is much better since starting omeprazole.   He denies any adenopathy.   He continues dasatinib daily without difficulty. He continues oral vitamin D. He is not currently on oral calcium.   Hemoglobin has mildly decreased from 14.2 to 13.5 and white count and platelets are normal.   Chemistries are unremarkable except for a sodium of 133, and a calcium of 8.3. I advised that he start oral calcium supplement BID.   His  appetite is fair, and he has lost nearly 8 pounds since his last visit.    He denies fever, chills or other signs of infection.  He denies nausea, vomiting, bowel issues, or abdominal pain.  He denies sore throat, cough, dyspnea, or chest pain.  REVIEW OF SYSTEMS:  Review of Systems  Constitutional: Negative.  Negative for appetite change, chills, fatigue, fever and  unexpected weight change.  HENT:  Negative.    Eyes: Negative.   Respiratory: Negative.  Negative for chest tightness, cough, hemoptysis, shortness of breath and wheezing.   Cardiovascular: Negative.  Negative for chest pain, leg swelling and palpitations.  Gastrointestinal: Negative.  Negative for abdominal distention, abdominal pain, blood in stool, constipation, diarrhea, nausea and vomiting.  Endocrine: Negative.   Genitourinary: Negative.  Negative for difficulty urinating, dysuria, frequency and hematuria.   Musculoskeletal: Negative.  Negative for arthralgias, back pain, flank pain, gait problem and myalgias.  Skin: Negative.   Neurological: Negative.  Negative for dizziness, extremity weakness, gait problem, headaches, light-headedness, numbness, seizures and speech difficulty.  Hematological: Negative.   Psychiatric/Behavioral: Negative.  Negative for depression and sleep disturbance. The patient is not nervous/anxious.   All other systems reviewed and are negative.    VITALS:  There were no vitals taken for this visit.  Wt Readings from Last 3 Encounters:  02/27/22 219 lb 1.6 oz (99.4 kg)  01/02/22 218 lb (98.9 kg)  12/13/21 218 lb (98.9 kg)    There is no height or weight on file to calculate BMI.  Performance status (ECOG): 0 - Asymptomatic  PHYSICAL EXAM:  Physical Exam Constitutional:      General: He is not in acute distress.    Appearance: Normal appearance. He is normal weight.  HENT:     Head: Normocephalic and atraumatic.  Eyes:     General: No scleral icterus.    Extraocular Movements: Extraocular movements intact.     Conjunctiva/sclera: Conjunctivae normal.     Pupils: Pupils are equal, round, and reactive to light.  Cardiovascular:     Rate and Rhythm: Normal rate and regular rhythm.     Pulses: Normal pulses.     Heart sounds: Normal heart sounds. No murmur heard.    No friction rub. No gallop.  Pulmonary:     Effort: Pulmonary effort is normal. No  respiratory distress.     Breath sounds: Normal breath sounds.  Abdominal:     General: Bowel sounds are normal. There is no distension.     Palpations: Abdomen is soft. There is no hepatomegaly, splenomegaly or mass.     Tenderness: There is no abdominal tenderness.  Musculoskeletal:        General: Normal range of motion.     Cervical back: Normal range of motion and neck supple.     Right lower leg: No edema.     Left lower leg: No edema.  Lymphadenopathy:     Cervical: No cervical adenopathy.  Skin:    General: Skin is warm and dry.  Neurological:  General: No focal deficit present.     Mental Status: He is alert and oriented to person, place, and time. Mental status is at baseline.  Psychiatric:        Mood and Affect: Mood normal.        Behavior: Behavior normal.        Thought Content: Thought content normal.        Judgment: Judgment normal.     LABS:      Latest Ref Rng & Units 02/27/2022   12:00 AM 11/27/2021   12:00 AM 08/27/2021   12:00 AM  CBC  WBC  4.0     5.5  7.0      Hemoglobin 13.5 - 17.5 13.7     13.5  14.2      Hematocrit 41 - 53 41     38  40      Platelets 150 - 400 K/uL 221     223  213         This result is from an external source.       Latest Ref Rng & Units 02/27/2022   12:00 AM 11/27/2021   12:00 AM 08/27/2021    3:26 PM  CMP  BUN 4 - 21 8     11     Creatinine 0.6 - 1.3 0.6     0.7    Sodium 137 - 147 137     133    Potassium 3.5 - 5.1 mEq/L 3.9     3.7    Chloride 99 - 108 102     101    CO2 13 - 22 25     26     Calcium 8.7 - 10.7 7.9     8.4  8.7   Alkaline Phos 25 - 125 70     54    AST 14 - 40 98     55    ALT 10 - 40 U/L 51     36       This result is from an external source.      Lab Results  Component Value Date   CEA1 4.0 02/27/2022   /  CEA  Date Value Ref Range Status  02/27/2022 4.0 0.0 - 4.7 ng/mL Final    Comment:    (NOTE)                             Nonsmokers          <3.9                              Smokers             <5.6 [employer] Electrochemiluminescence Immunoassay (ECLIA) Values obtained with different assay methods or kits cannot be used interchangeably.  Results cannot be interpreted as absolute evidence of the presence or absence of malignant disease. Performed At: [facility] [place], [place] [id number] Rush Farmer MD ML:[id number]     Lab Results  Component Value Date   [phone] 12 02/27/2022    STUDIES:  EXAM:03/06/22 CT ABDOMEN AND PELVIS WITH CONTRAST IMPRESSION: No acute findings. No evidence of metastatic disease or lymphoproliferative disorder within the abdomen or pelvis.  Stable small benign left adrenal adenoma   Aortic Atherosclerosis HISTORY:   Allergies:  Allergies  Allergen Reactions   Pneumococcal Vaccines Swelling  Current Medications: Current Outpatient Medications  Medication Sig Dispense Refill   albuterol (PROVENTIL HFA;VENTOLIN HFA) 108 (90 BASE) MCG/ACT inhaler Inhale 2 puffs into the lungs every 6 (six) hours as needed for wheezing or shortness of breath.     allopurinol (ZYLOPRIM) 300 MG tablet Take 300 mg by mouth daily.     Calcium Carbonate (CALCIUM 500 PO) Take 1 tablet by mouth in the morning and at bedtime.     Cholecalciferol (VITAMIN D) 50 MCG (2000 UT) tablet Take 2,000 Units by mouth daily.     cyclobenzaprine (FLEXERIL) 5 MG tablet Take 5 mg by mouth at bedtime as needed for muscle spasms.     diclofenac Sodium (VOLTAREN) 1 % GEL Apply 2 g topically at bedtime.     docusate sodium (COLACE) 100 MG capsule Take 200 mg by mouth daily.     fluticasone (FLONASE) 50 MCG/ACT nasal spray Place 2 sprays into both nostrils daily.     HYDROcodone-acetaminophen (NORCO) 7.5-325 MG tablet Take 1 tablet by mouth 2 (two) times daily.     HYDROcodone-acetaminophen (NORCO/VICODIN) 5-325 MG tablet Take 1 tablet by mouth 4 (four) times daily as needed.     irbesartan-hydrochlorothiazide (AVALIDE) 150-12.5  MG tablet Take 1 tablet by mouth daily.     methocarbamol (ROBAXIN) 500 MG tablet Take 500 mg by mouth daily.     omeprazole (PRILOSEC) 40 MG capsule Take 1 capsule (40 mg total) by mouth daily. 60 capsule 4   SPRYCEL 100 MG tablet TAKE 1 TABLET BY MOUTH  DAILY 30 tablet 5   tamsulosin (FLOMAX) 0.4 MG CAPS capsule Take 0.8 mg by mouth daily.     TRELEGY ELLIPTA 100-62.5-25 MCG/INH AEPB Inhale 1 puff into the lungs daily at 6 (six) AM.     No current facility-administered medications for this visit.      I,Gabriella Ballesteros,acting as a scribe for Kyle Kaplan, MD.,have documented all relevant documentation on the behalf of Kyle Kaplan, MD,as directed by  Kyle Kaplan, MD while in the presence of Kyle Kaplan, MD.   I have reviewed this report as typed by the medical scribe, and it is complete and accurate.

## 2022-05-29 DIAGNOSIS — C3412 Malignant neoplasm of upper lobe, left bronchus or lung: Secondary | ICD-10-CM | POA: Diagnosis not present

## 2022-05-29 DIAGNOSIS — I2510 Atherosclerotic heart disease of native coronary artery without angina pectoris: Secondary | ICD-10-CM | POA: Diagnosis not present

## 2022-05-29 DIAGNOSIS — I700 Atherosclerosis of aorta: Secondary | ICD-10-CM | POA: Diagnosis not present

## 2022-05-29 DIAGNOSIS — I712 Thoracic aortic aneurysm, without rupture, unspecified: Secondary | ICD-10-CM | POA: Diagnosis not present

## 2022-05-29 DIAGNOSIS — R911 Solitary pulmonary nodule: Secondary | ICD-10-CM | POA: Diagnosis not present

## 2022-05-29 LAB — BASIC METABOLIC PANEL
BUN: 10 (ref 4–21)
CO2: 30 — AB (ref 13–22)
Chloride: 101 (ref 99–108)
Creatinine: 0.6 (ref 0.6–1.3)
Glucose: 94
Potassium: 4.2 mEq/L (ref 3.5–5.1)
Sodium: 134 — AB (ref 137–147)

## 2022-05-29 LAB — CBC: RBC: 3.98 (ref 3.87–5.11)

## 2022-05-29 LAB — COMPREHENSIVE METABOLIC PANEL
Albumin: 4.1 (ref 3.5–5.0)
Calcium: 8.5 — AB (ref 8.7–10.7)

## 2022-05-29 LAB — CBC AND DIFFERENTIAL
HCT: 40 — AB (ref 41–53)
Hemoglobin: 13.9 (ref 13.5–17.5)
Neutrophils Absolute: 1.79
Platelets: 226 10*3/uL (ref 150–400)
WBC: 3.8

## 2022-05-29 LAB — HEPATIC FUNCTION PANEL
ALT: 30 U/L (ref 10–40)
AST: 51 — AB (ref 14–40)
Alkaline Phosphatase: 62 (ref 25–125)
Bilirubin, Total: 0.7

## 2022-05-30 ENCOUNTER — Ambulatory Visit: Payer: BC Managed Care – PPO | Admitting: Oncology

## 2022-06-05 ENCOUNTER — Other Ambulatory Visit: Payer: Self-pay | Admitting: Oncology

## 2022-06-06 ENCOUNTER — Encounter: Payer: Self-pay | Admitting: Hematology and Oncology

## 2022-06-21 ENCOUNTER — Telehealth: Payer: Self-pay | Admitting: Oncology

## 2022-06-21 NOTE — Telephone Encounter (Signed)
Patient has been scheduled. Aware of appt date and time    Scheduling Message Entered by Arden, AMY W on 06/20/2022 at  9:26 AM Priority: High EST PT 30  Department: [facility]  Provider: [name], MD  Appointment Notes:  Missed last appt, please R/S  Scheduling Notes:

## 2022-06-26 DIAGNOSIS — N401 Enlarged prostate with lower urinary tract symptoms: Secondary | ICD-10-CM | POA: Diagnosis not present

## 2022-06-26 DIAGNOSIS — R351 Nocturia: Secondary | ICD-10-CM | POA: Diagnosis not present

## 2022-06-27 ENCOUNTER — Inpatient Hospital Stay: Payer: BC Managed Care – PPO

## 2022-06-27 ENCOUNTER — Inpatient Hospital Stay: Payer: BC Managed Care – PPO | Attending: Hematology and Oncology | Admitting: Hematology and Oncology

## 2022-06-27 ENCOUNTER — Encounter: Payer: Self-pay | Admitting: Hematology and Oncology

## 2022-06-27 ENCOUNTER — Telehealth: Payer: Self-pay

## 2022-06-27 DIAGNOSIS — I712 Thoracic aortic aneurysm, without rupture, unspecified: Secondary | ICD-10-CM

## 2022-06-27 DIAGNOSIS — C9210 Chronic myeloid leukemia, BCR/ABL-positive, not having achieved remission: Secondary | ICD-10-CM | POA: Diagnosis not present

## 2022-06-27 DIAGNOSIS — R748 Abnormal levels of other serum enzymes: Secondary | ICD-10-CM

## 2022-06-27 DIAGNOSIS — C3492 Malignant neoplasm of unspecified part of left bronchus or lung: Secondary | ICD-10-CM | POA: Diagnosis not present

## 2022-06-27 DIAGNOSIS — C249 Malignant neoplasm of biliary tract, unspecified: Secondary | ICD-10-CM

## 2022-06-27 DIAGNOSIS — I7121 Aneurysm of the ascending aorta, without rupture: Secondary | ICD-10-CM | POA: Insufficient documentation

## 2022-06-27 HISTORY — DX: Aneurysm of the ascending aorta, without rupture: I71.21

## 2022-06-27 HISTORY — DX: Thoracic aortic aneurysm, without rupture, unspecified: I71.20

## 2022-06-27 LAB — CBC: RBC: 4.31 (ref 3.87–5.11)

## 2022-06-27 LAB — HEPATIC FUNCTION PANEL
ALT: 29 U/L (ref 10–40)
AST: 54 — AB (ref 14–40)
Alkaline Phosphatase: 56 (ref 25–125)
Bilirubin, Total: 0.6

## 2022-06-27 LAB — BASIC METABOLIC PANEL
BUN: 7 (ref 4–21)
CO2: 25 — AB (ref 13–22)
Chloride: 100 (ref 99–108)
Creatinine: 0.6 (ref 0.6–1.3)
Glucose: 92
Potassium: 3.9 mEq/L (ref 3.5–5.1)
Sodium: 132 — AB (ref 137–147)

## 2022-06-27 LAB — COMPREHENSIVE METABOLIC PANEL
Albumin: 4.1 (ref 3.5–5.0)
Calcium: 8.5 — AB (ref 8.7–10.7)

## 2022-06-27 LAB — CBC AND DIFFERENTIAL
HCT: 42 (ref 41–53)
Hemoglobin: 14.9 (ref 13.5–17.5)
MCV: 98 — AB (ref 80–94)
Neutrophils Absolute: 2.15
Platelets: 229 10*3/uL (ref 150–400)
WBC: 5.8

## 2022-06-27 NOTE — Assessment & Plan Note (Signed)
This has resolved with increasing his calcium supplement to 3 times daily. Vitamin D was normal in June.  He knows to continue calcium as recommended.

## 2022-06-27 NOTE — Assessment & Plan Note (Signed)
CT imaging in September did not reveal any evidence of recurrence.

## 2022-06-27 NOTE — Assessment & Plan Note (Signed)
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 He had a mild relapse in May 2018, but by that August, the testing returned to complete molecular remission and he has remained in complete molecular remission since that time. PCR for BCR/ABL is pending from today.He knows to continue dasatinib daily.  We will plan to see him back in 3 months with a CBC, comprehensive metabolic panel and PCR for BCR/ABL.

## 2022-06-27 NOTE — Progress Notes (Signed)
[place]  [address] [place],  [place]  [address] [phone]  Clinic Day:  06/27/2022  Referring physician: Barnetta Chapel, NP  ASSESSMENT & PLAN:   Assessment & Plan: CML (chronic myelocytic leukemia) ([place]) CML controlled with dasatinib 100 mg daily.  He had a mild relapse in May 2018, but by that August, the testing returned to complete molecular remission and he has remained in complete molecular remission since that time. PCR for BCR/ABL is pending from today. He knows to continue dasatinib daily.  We will plan to see him back in 3 months with a CBC, comprehensive metabolic panel and PCR for BCR/ABL.  Abnormal transaminases Elevation of liver transaminases felt to be due to heavy alcohol use.  He had been drinking 1 pint of bourbon a day.  However, due to his history of cholangiocarcinoma, he underwent CT abdomen and pelvis that did not reveal any evidence of malignancy.  He has decreased his alcohol use to 3 beers a day.  The transaminases have normalized.  Carcinoma of biliary tract [facility]) CT imaging in August did not reveal any evidence of recurrence.  Non-small cell carcinoma of lung ([place]) CT imaging in September did not reveal any evidence of recurrence.  Thoracic aortic aneurysm [facility]) Recent CT imaging revealed stable mild dilatation of the ascending thoracic aorta measuring 4.1 cm. Recommend annual imaging followup by CTA or MRA.  We will plan to follow this with CTA chest in 1 year.  Hypocalcemia This has resolved with increasing his calcium supplement to 3 times daily. Vitamin D was normal in June.  He knows to continue calcium as recommended.   The patient understands the plans discussed today and is in agreement with them.  He knows to contact our office if he develops concerns prior to his next appointment.   I provided 20 minutes of face-to-face time during this encounter and > 50% was spent counseling as documented under my  assessment and plan.    Marvia Pickles, PA-C  [facility] AT [facility] [address] [place] [address] Dept: [phone] Dept Fax: [phone]   Orders Placed This Encounter  Procedures   BCR-ABL1, CML/ALL, PCR, QUANT    Standing Status:   Future    Number of Occurrences:   1    Standing Expiration Date:   06/28/2023   CBC and differential    This external order was created through the Results Console.   CBC    This external order was created through the Results Console.   Basic metabolic panel    This external order was created through the Results Console.   Comprehensive metabolic panel    This external order was created through the Results Console.   Hepatic function panel    This external order was created through the Results Console.      CHIEF COMPLAINT:  CC: Chronic myelogenous leukemia  Current Treatment: Dasatinib 100 mg daily  HISTORY OF PRESENT ILLNESS:  71 year old with chronic myelogenous leukemia diagnosed in October 2015 when he presented with leukocytosis.  He was originally placed on nilotinib, but developed a severe pruritic rash, so was switched to dasatinib 100 mg daily.  He achieved a major molecular remission by May 2016.   He also has a history of a stage IA cholangiocarcinoma, treated with surgical resection in June 2014.   He then underwent surgical resection of a stage IA (T1 N0 M0) non-small cell lung  cancer in February 2016.  Pathology revealed a 3 cm, well-differentiated, adenocarcinoma with negative nodes.  Adjuvant chemotherapy for his lung cancer was not recommended.  He has chronic back pain and continues seeing spine specialist.  He apparently has significant degenerative disc disease.  He has also had upper abdominal pain, which he attributes to dasatinib.  He uses hydrocodone/APAP as needed for his pain.  In May 2018, PCR was positive at 0.012%, consistent with residual CML.  Repeat PCR in  August 2018 revealed a major molecular response.  His CML has remained in remission since that time.  CT chest, abdomen and pelvis in November 2019 did not reveal evidence of recurrence of either malignancy.     CT chest in August 2020 did not reveal any evidence of recurrent lung malignancy.  He has had hypocalcemia, so was instructed to take calcium 600 mg twice daily.  When he was seen in November 2020, had been having lower leg pain worsening throughout the day.  He underwent arterial ultrasound/ankle brachial indices, which was normal.  The pain was then felt to possibly be degenerative in nature.  He reported constipation due to the calcium and as his on calcium was normal at that time,  we decreased the calcium to daily.  He had worsening neutropenia and was found to have B12 and placed on B12 injections weekly for 4 weeks, then monthly.  Serum folate was normal.  He had persistent abdominal pain, so was referred to Dr. Lyndel Safe.  He was seen by Dr. Lyndel Safe in 2021 and had 2 gastric ulcers and H. Pylori infection, which was treated.  He saw Dr. Nila Nephew earlier in 2021 as well, and PSA was normal. When he was seen in August, he had recurrent hypocalcemia, with a calcium of 8.3.  He could not tolerate the calcium supplement, so we recommended that he take Tums 4 times daily.  He continued to complain of intermittent abdominal pain, which we have attributed to adhesions.  He has had rotator cuff surgery in January 2022.  He had a colonoscopy in May 2022 by Dr. Lyndel Safe with findings of a large polyp which was resected via colonoscopy in August 2022.  He had EGD in August which revealed non-bleeding jejunal ulcers.  He was placed on omeprazole 40 mg twice daily.  His abdominal pain resolved with omeprazole.  PCR for BCR/ABL has continued to be negative.  He had elevation of the liver transaminases in June felt to be due to heavy alcohol use.  He had recurrent hypocalcemia, so I increased his calcium to 3 times a day.   CT abdomen and pelvis did not reveal any evidence of malignancy.  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[place])  02/15/2013 Initial Diagnosis   Carcinoma of biliary tract ([place])   03/06/2013 Cancer Staging   Staging form: Distal Bile Ducts, AJCC 7th Edition - Clinical stage from 03/06/2013: Stage IA (T1, N0, M0) - Signed by [name], MD on 11/09/2020 Staged by: Managing physician Diagnostic confirmation: Positive histology Specimen type: Excision Histopathologic type: Cholangiocarcinoma Stage prefix: Initial diagnosis Lymph-vascular invasion (LVI): LVI not present (absent)/not identified Residual tumor (R): R0 - None Stage used in treatment planning: Yes National guidelines used in treatment planning: Yes Type of national guideline used in treatment planning: NCCN   Non-small cell carcinoma of lung ([place])  11/08/2014 Cancer Staging   Staging form: Lung, AJCC 7th Edition - Clinical stage from 11/08/2014: Stage IA (T1b, N0, M0) - Signed by Hosie Poisson  H, MD on 11/09/2020 Staged by: Managing physician Diagnostic confirmation: Positive histology Specimen type: Excision Histopathologic type: Adenocarcinoma, NOS Stage prefix: Initial diagnosis Laterality: Left Tumor size (mm): 30 Histologic grade (G): G1 Lymph-vascular invasion (LVI): LVI not present (absent)/not identified Residual tumor (R): R0 - None Pleural/elastic layer invasion: PL0 Prognostic indicators: Resection LUL Stage used in treatment planning: Yes National guidelines used in treatment planning: Yes Type of national guideline used in treatment planning: NCCN   01/31/2015 Initial Diagnosis   Non-small cell carcinoma of lung ([place])       INTERVAL HISTORY:  Kyle Montoya is here today for repeat clinical assessment and states he continues dasatinib daily without significant difficulty.  He states he is no longer drinking bourbon, but still drinking 3 beers a day.  He reports occasional fatigue, but he  continues to work. He denies fevers or chills. He denies pain. His appetite is good. His weight has increased 2 pounds over last 4 months .  CT chest in September to follow up on a pulmonary nodule was stable. A stable, 4.1 cm ascending aortic aneurysm was noted.  REVIEW OF SYSTEMS:  Review of Systems  Constitutional:  Negative for appetite change, chills, fatigue, fever and unexpected weight change.  HENT:   Negative for lump/mass, mouth sores and sore throat.   Respiratory:  Negative for cough and shortness of breath.   Cardiovascular:  Negative for chest pain and leg swelling.  Gastrointestinal:  Negative for abdominal pain, constipation, diarrhea, nausea and vomiting.  Genitourinary:  Negative for difficulty urinating, dysuria, frequency and hematuria.   Musculoskeletal:  Negative for arthralgias, back pain and myalgias.  Skin:  Negative for itching, rash and wound.  Neurological:  Negative for dizziness, extremity weakness, headaches, light-headedness and numbness.  Hematological:  Negative for adenopathy.  Psychiatric/Behavioral:  Negative for depression and sleep disturbance. The patient is not nervous/anxious.      VITALS:  Blood pressure (!) 178/88, pulse 78, temperature 98.2 F (36.8 C), temperature source Oral, resp. rate 18, height 5\' 10"  (1.778 m), weight 221 lb 8 oz (100.5 kg), SpO2 97 %.  Wt Readings from Last 3 Encounters:  06/27/22 221 lb 8 oz (100.5 kg)  02/27/22 219 lb 1.6 oz (99.4 kg)  01/02/22 218 lb (98.9 kg)    Body mass index is 31.78 kg/m.  Performance status (ECOG): 0 - Asymptomatic  PHYSICAL EXAM:  Physical Exam Vitals and nursing note reviewed.  Constitutional:      General: He is not in acute distress.    Appearance: Normal appearance. He is normal weight.  HENT:     Head: Normocephalic and atraumatic.     Mouth/Throat:     Mouth: Mucous membranes are moist.     Pharynx: Oropharynx is clear. No oropharyngeal exudate or posterior oropharyngeal  erythema.  Eyes:     General: No scleral icterus.    Extraocular Movements: Extraocular movements intact.     Conjunctiva/sclera: Conjunctivae normal.     Pupils: Pupils are equal, round, and reactive to light.  Cardiovascular:     Rate and Rhythm: Normal rate and regular rhythm.     Heart sounds: Normal heart sounds. No murmur heard.    No friction rub. No gallop.  Pulmonary:     Effort: Pulmonary effort is normal.     Breath sounds: Normal breath sounds. No wheezing, rhonchi or rales.  Abdominal:     General: Bowel sounds are normal. There is no distension.     Palpations: Abdomen is soft. There  is no hepatomegaly, splenomegaly or mass.     Tenderness: There is no abdominal tenderness.  Musculoskeletal:        General: Normal range of motion.     Cervical back: Normal range of motion and neck supple. No tenderness.     Right lower leg: No edema.     Left lower leg: No edema.  Lymphadenopathy:     Cervical: No cervical adenopathy.     Upper Body:     Right upper body: No supraclavicular or axillary adenopathy.     Left upper body: No supraclavicular or axillary adenopathy.     Lower Body: No right inguinal adenopathy. No left inguinal adenopathy.  Skin:    General: Skin is warm and dry.     Coloration: Skin is not jaundiced.     Findings: No rash.  Neurological:     Mental Status: He is alert and oriented to person, place, and time.     Cranial Nerves: No cranial nerve deficit.  Psychiatric:        Mood and Affect: Mood normal.        Behavior: Behavior normal.        Thought Content: Thought content normal.     LABS:      Latest Ref Rng & Units 06/27/2022   12:00 AM 05/29/2022   12:00 AM 02/27/2022   12:00 AM  CBC  WBC  5.8     3.8     4.0      Hemoglobin 13.5 - 17.5 14.9     13.9     13.7      Hematocrit 41 - 53 42     40     41      Platelets 150 - 400 K/uL 229     226     221         This result is from an external source.      Latest Ref Rng & Units  06/27/2022   12:00 AM 05/29/2022   12:00 AM 02/27/2022   12:00 AM  CMP  BUN 4 - 21 7     10     8       Creatinine 0.6 - 1.3 0.6     0.6     0.6      Sodium 137 - 147 132     134     137      Potassium 3.5 - 5.1 mEq/L 3.9     4.2     3.9      Chloride 99 - 108 100     101     102      CO2 13 - 22 25     30     25       Calcium 8.7 - 10.7 8.5     8.5     7.9      Alkaline Phos 25 - 125 76     62     70      AST 14 - 40 54     51     98      ALT 10 - 40 U/L 29     30     51         This result is from an external source.     Lab Results  Component Value Date   CEA1 4.0 02/27/2022   /  CEA  Date Value Ref Range Status  02/27/2022  4.0 0.0 - 4.7 ng/mL Final    Comment:    (NOTE)                             Nonsmokers          <3.9                             Smokers             <5.6 [employer] Electrochemiluminescence Immunoassay (ECLIA) Values obtained with different assay methods or kits cannot be used interchangeably.  Results cannot be interpreted as absolute evidence of the presence or absence of malignant disease. Performed At: [facility] [place], [place] [id number] Rush Farmer MD [id number]    No results found for: "PSA1" Lab Results  Component Value Date   XNA355 12 02/27/2022   No results found for: "CAN125"  No results found for: "TOTALPROTELP", "ALBUMINELP", "A1GS", "A2GS", "BETS", "BETA2SER", "GAMS", "MSPIKE", "SPEI" No results found for: "TIBC", "FERRITIN", "IRONPCTSAT" No results found for: "LDH"  STUDIES:  No results found.    Exam(s): [id number] CT/CT CHEST W/ CM  CLINICAL DATA: History of left upper lobe lung cancer and CML.  Restaging.   EXAM:  CT CHEST WITH CONTRAST  TECHNIQUE:  Multidetector CT imaging of the chest was performed during  intravenous contrast administration.   RADIATION DOSE REDUCTION: This exam was performed according to the  departmental dose-optimization program which includes automated   exposure control, adjustment of the mA and/or kV according to  patient size and/or use of iterative reconstruction technique.   CONTRAST: 60 cc of Isovue 370   COMPARISON: 06/29/2021   FINDINGS:  Cardiovascular: Heart size is normal. Stable mild aneurysmal  dilatation of the ascending thoracic aorta measuring 4.1 cm. Trace  pericardial effusion. Aortic atherosclerosis and multi vessel  coronary artery calcifications.  Mediastinum/Nodes: No enlarged mediastinal, hilar, or axillary lymph  nodes. Thyroid gland, trachea, and esophagus demonstrate no  significant findings.  Lungs/Pleura: Unchanged trace pleural fluid overlying the posterior  right lower lung, image 90/2. Status post left upper lobectomy. Tiny  right middle lobe lung nodule measures 4 mm and is unchanged, image  98/3. No new or suspicious lung nodule or mass identified.  Upper Abdomen: No acute abnormality identified within the imaged  portions of the upper abdomen. Left adrenal adenoma measures 1.2 cm  and is unchanged compared with the previous exam, image 102/2. No  follow-up imaging recommended. Normal right adrenal gland. Status  post cholecystectomy and choledocho jejunostomy. No signs of bile  duct dilatation identified. Musculoskeletal: Remote healed right posterior eleventh rib  fracture, image 129/2. There also remote healed left eleventh rib  fractures. No acute or suspicious osseous findings.   IMPRESSION:  1. Stable CT of the chest. Status post left upper lobectomy. No  findings to suggest local tumor recurrence or metastatic disease.  2. Multi vessel coronary artery calcifications.  3. Aortic Atherosclerosis (ICD10-I70.0).  4. Stable mild dilatation of the ascending thoracic aorta measuring  4.1 cm. Recommend annual imaging followup by CTA or MRA. This  recommendation follows 2010  ACCF/AHA/AATS/ACR/ASA/SCA/SCAI/SIR/STS/SVM Guidelines for the  Diagnosis and Management of Patients with Thoracic Aortic  Disease.  Circulation. 2010; 121: D322-G254. Aortic aneurysm NOS (ICD10-I71.9)      Exam(s): [id number] CT/CT ABD-PELV W/IV CM  CLINICAL DATA: Follow-up lung carcinoma and chronic myelogenous  leukemia. Left adrenal mass.  *  Tracking Code: BO *   EXAM:  CT ABDOMEN AND PELVIS WITH CONTRAST  TECHNIQUE:   Multidetector CT imaging of the abdomen and pelvis was performed  using the standard protocol following bolus administration of  intravenous contrast.   RADIATION DOSE REDUCTION: This exam was performed according to the  departmental dose-optimization program which includes automated  exposure control, adjustment of the mA and/or kV according to  patient size and/or use of iterative reconstruction technique.  CONTRAST: 100 mL Isovue 370   COMPARISON: 06/29/2021   FINDINGS:  Lower Chest: No acute findings.  Hepatobiliary: No hepatic masses identified. Prior cholecystectomy.  No evidence of biliary obstruction.  Pancreas: Postop changes again seen which compatible with Whipple  procedure. No mass or inflammatory changes.  Spleen: Within normal limits in size and appearance.  Adrenals/Urinary Tract: 1.2 cm left adrenal mass remains stable,  consistent with benign adenoma. No renal masses identified.  Congenital malrotation of right kidney again noted. No evidence of  ureteral calculi or hydronephrosis. Unremarkable unopacified urinary  bladder.  Stomach/Bowel: No evidence of obstruction, inflammatory process or  abnormal fluid collections.  Vascular/Lymphatic: No pathologically enlarged lymph nodes. No acute  vascular findings. Aortic atherosclerotic calcification incidentally  noted.  Reproductive: No mass or other significant abnormality.  Other: None.  Musculoskeletal: No suspicious bone lesions identified. Old  bilateral posterior rib fracture deformities again noted.   IMPRESSION:  No acute findings. No evidence of metastatic disease or  lymphoproliferative disorder  within the abdomen or pelvis.  Stable small benign left adrenal adenoma.  Aortic Atherosclerosis (ICD10-I70.0).   HISTORY:   Past Medical History:  Diagnosis Date   Abnormal transaminases 02/27/2022   Arthritis    on PRN meds   Asthma    uses inhaler   BPH (benign prostatic hyperplasia)    CAD (coronary artery disease)    Cataract    bilateral sx   Cholangiocarcinoma of biliary tract ([facility])    Chronic myelogenous leukemia ([facility])    COPD (chronic obstructive pulmonary disease) ([facility])    uses inhaler   DJD (degenerative joint disease)    Dyslipidemia    Family history of colonic polyps    Family history of prostate cancer    Gout    HTN (hypertension)    on meds   Hypercholesterolemia    Lung cancer ([facility])    Myelogenous leukemia ([facility])    OA (osteoarthritis)    PAD (peripheral artery disease) ([facility])    Personal history of colonic polyps    Renal insufficiency    Tendonitis    patellar   Thoracic aortic aneurysm ([place]) 06/27/2022    Past Surgical History:  Procedure Laterality Date   BIOPSY  04/30/2021   Procedure: BIOPSY;  Surgeon: Rush Landmark Telford Nab., MD;  Location: [facility];  Service: Gastroenterology;;   Bowel duct surg  2014   Bowel cancer   CARDIAC CATHETERIZATION  2000   CHOLECYSTECTOMY     COLONOSCOPY  06/23/2015   mild sigmoid diverticulosis. small external and internal hemorrhoids. otherwise normal colonoscopy to terminal ileum   COLONOSCOPY WITH PROPOFOL N/A 04/30/2021   Procedure: COLONOSCOPY WITH PROPOFOL;  Surgeon: Rush Landmark Telford Nab., MD;  Location: [facility];  Service: Gastroenterology;  Laterality: N/A;   ENDOSCOPIC MUCOSAL RESECTION N/A 04/30/2021   Procedure: ENDOSCOPIC MUCOSAL RESECTION;  Surgeon: Rush Landmark Telford Nab., MD;  Location: [facility];  Service: Gastroenterology;  Laterality: N/A;   ESOPHAGOGASTRODUODENOSCOPY (EGD) WITH PROPOFOL N/A 04/30/2021   Procedure: ESOPHAGOGASTRODUODENOSCOPY (EGD) WITH PROPOFOL;  Surgeon: Justice Britain  Brooke Bonito., MD;  Location: [facility];  Service: Gastroenterology;  Laterality: N/A;   HEMOSTASIS CLIP PLACEMENT  04/30/2021   Procedure: HEMOSTASIS CLIP PLACEMENT;  Surgeon: Irving Copas., MD;  Location: [facility];  Service: Gastroenterology;;   LUNG CANCER SURGERY  Jan. 2016   partial lobectomy   POLYPECTOMY  04/30/2021   Procedure: POLYPECTOMY;  Surgeon: Mansouraty, Telford Nab., MD;  Location: [facility];  Service: Gastroenterology;;   SHOULDER OPEN ROTATOR CUFF REPAIR Right 09/27/2020   Procedure: Right shoulder mini open rotator cuff repair, distal clavicle resection;  Surgeon: Susa Day, MD;  Location: [facility];  Service: Orthopedics;  Laterality: Right;  90 mins  Choice with Block anesthesia   SUBMUCOSAL LIFTING INJECTION  04/30/2021   Procedure: SUBMUCOSAL LIFTING INJECTION;  Surgeon: Irving Copas., MD;  Location: [facility];  Service: Gastroenterology;;   wipple procedure     stage I- due to bile duct cancer-2014 at [facility] (Dr Vernard Gambles). Stage IA cholangiocarcinoma    Family History  Problem Relation Age of Onset   Hypertension Mother    Prostate cancer Father 39       deceased   Hypertension Sister    Asthma Sister    Hypertension Brother    Cancer Half-Sister        unknown, dx 54   Esophageal cancer Neg Hx    Colon polyps Neg Hx    Colon cancer Neg Hx    Stomach cancer Neg Hx    Rectal cancer Neg Hx    Inflammatory bowel disease Neg Hx    Liver disease Neg Hx    Pancreatic cancer Neg Hx     Social History:  reports that he has been smoking cigarettes. He has a 10.00 pack-year smoking history. He has never used smokeless tobacco. He reports current alcohol use of about 21.0 standard drinks of alcohol per week. He reports that he does not use drugs.The patient is alone today.  Allergies:  Allergies  Allergen Reactions   Pneumococcal Vaccines Swelling    Current Medications: Current Outpatient Medications  Medication Sig Dispense Refill   albuterol  (PROVENTIL HFA;VENTOLIN HFA) 108 (90 BASE) MCG/ACT inhaler Inhale 2 puffs into the lungs every 6 (six) hours as needed for wheezing or shortness of breath.     allopurinol (ZYLOPRIM) 300 MG tablet Take 300 mg by mouth daily.     Calcium Carbonate (CALCIUM 500 PO) Take 1 tablet by mouth 3 (three) times daily.     Cholecalciferol (VITAMIN D) 50 MCG (2000 UT) tablet Take 2,000 Units by mouth daily.     colchicine 0.6 MG tablet Take 1 tablet every day by oral route as directed for 3 days.     cyclobenzaprine (FLEXERIL) 5 MG tablet Take 5 mg by mouth at bedtime as needed for muscle spasms.     diclofenac Sodium (VOLTAREN) 1 % GEL Apply 2 g topically at bedtime.     docusate sodium (COLACE) 100 MG capsule Take 200 mg by mouth daily.     fluticasone (FLONASE) 50 MCG/ACT nasal spray Place 2 sprays into both nostrils daily.     HYDROcodone-acetaminophen (NORCO/VICODIN) 5-325 MG tablet Take 1 tablet by mouth 4 (four) times daily as needed.     irbesartan-hydrochlorothiazide (AVALIDE) 150-12.5 MG tablet Take 1 tablet by mouth daily.     methocarbamol (ROBAXIN) 500 MG tablet Take 500 mg by mouth daily.     omeprazole (PRILOSEC) 40 MG capsule Take 1 capsule (40 mg total) by mouth daily. 60 capsule 4  SPRYCEL 100 MG tablet TAKE 1 TABLET BY MOUTH DAILY 30 tablet 5   tamsulosin (FLOMAX) 0.4 MG CAPS capsule Take 0.8 mg by mouth daily.     TRELEGY ELLIPTA 100-62.5-25 MCG/INH AEPB Inhale 1 puff into the lungs daily at 6 (six) AM.     No current facility-administered medications for this visit.

## 2022-06-27 NOTE — Assessment & Plan Note (Signed)
Elevation of liver transaminases felt to be due to heavy alcohol use.  He had been drinking 1 pint of bourbon a day.  However, due to his history of cholangiocarcinoma, he underwent CT abdomen and pelvis that did not reveal any evidence of malignancy.  He has decreased his alcohol use to 3 beers a day.  The transaminases have normalized.

## 2022-06-27 NOTE — Telephone Encounter (Signed)
Patient notified that labs came back and are normal, Liver enzymes back to normal and calcium back to normal. Patient advised to continue current plan of treatment.

## 2022-06-27 NOTE — Assessment & Plan Note (Signed)
CT imaging in August did not reveal any evidence of recurrence.

## 2022-06-27 NOTE — Assessment & Plan Note (Signed)
Recent CT imaging revealed stable mild dilatation of the ascending thoracic aorta measuring 4.1 cm. Recommend annual imaging followup by CTA or MRA.  We will plan to follow this with CTA chest in 1 year.

## 2022-06-27 NOTE — Telephone Encounter (Signed)
-----   Message from Marvia Pickles, PA-C sent at 06/27/2022  1:10 PM EDT ----- Please let him know his labs looked good. Liver tests and calcium back to normal. Continue doing what he is doing. Thanks

## 2022-07-01 DIAGNOSIS — C9210 Chronic myeloid leukemia, BCR/ABL-positive, not having achieved remission: Secondary | ICD-10-CM | POA: Diagnosis not present

## 2022-07-04 DIAGNOSIS — Z23 Encounter for immunization: Secondary | ICD-10-CM | POA: Diagnosis not present

## 2022-07-05 ENCOUNTER — Encounter: Payer: Self-pay | Admitting: Oncology

## 2022-07-05 LAB — BCR-ABL1, CML/ALL, PCR, QUANT: Interpretation (BCRAL):: NEGATIVE

## 2022-07-08 DIAGNOSIS — M47812 Spondylosis without myelopathy or radiculopathy, cervical region: Secondary | ICD-10-CM | POA: Diagnosis not present

## 2022-07-08 DIAGNOSIS — G894 Chronic pain syndrome: Secondary | ICD-10-CM | POA: Diagnosis not present

## 2022-07-08 DIAGNOSIS — M47816 Spondylosis without myelopathy or radiculopathy, lumbar region: Secondary | ICD-10-CM | POA: Diagnosis not present

## 2022-07-08 DIAGNOSIS — M150 Primary generalized (osteo)arthritis: Secondary | ICD-10-CM | POA: Diagnosis not present

## 2022-07-15 DIAGNOSIS — J301 Allergic rhinitis due to pollen: Secondary | ICD-10-CM | POA: Diagnosis not present

## 2022-07-15 DIAGNOSIS — G2581 Restless legs syndrome: Secondary | ICD-10-CM | POA: Diagnosis not present

## 2022-07-15 DIAGNOSIS — I729 Aneurysm of unspecified site: Secondary | ICD-10-CM | POA: Diagnosis not present

## 2022-07-15 DIAGNOSIS — J449 Chronic obstructive pulmonary disease, unspecified: Secondary | ICD-10-CM | POA: Diagnosis not present

## 2022-07-17 DIAGNOSIS — M47816 Spondylosis without myelopathy or radiculopathy, lumbar region: Secondary | ICD-10-CM | POA: Diagnosis not present

## 2022-07-17 DIAGNOSIS — M150 Primary generalized (osteo)arthritis: Secondary | ICD-10-CM | POA: Diagnosis not present

## 2022-07-17 DIAGNOSIS — M545 Low back pain, unspecified: Secondary | ICD-10-CM | POA: Diagnosis not present

## 2022-07-17 DIAGNOSIS — M47812 Spondylosis without myelopathy or radiculopathy, cervical region: Secondary | ICD-10-CM | POA: Diagnosis not present

## 2022-08-23 DIAGNOSIS — R748 Abnormal levels of other serum enzymes: Secondary | ICD-10-CM | POA: Diagnosis not present

## 2022-08-23 DIAGNOSIS — R051 Acute cough: Secondary | ICD-10-CM | POA: Diagnosis not present

## 2022-08-23 DIAGNOSIS — F17210 Nicotine dependence, cigarettes, uncomplicated: Secondary | ICD-10-CM | POA: Diagnosis not present

## 2022-08-23 DIAGNOSIS — Z0001 Encounter for general adult medical examination with abnormal findings: Secondary | ICD-10-CM | POA: Diagnosis not present

## 2022-08-23 DIAGNOSIS — Z20828 Contact with and (suspected) exposure to other viral communicable diseases: Secondary | ICD-10-CM | POA: Diagnosis not present

## 2022-08-23 DIAGNOSIS — Z1339 Encounter for screening examination for other mental health and behavioral disorders: Secondary | ICD-10-CM | POA: Diagnosis not present

## 2022-08-23 DIAGNOSIS — Z789 Other specified health status: Secondary | ICD-10-CM | POA: Insufficient documentation

## 2022-08-23 DIAGNOSIS — R809 Proteinuria, unspecified: Secondary | ICD-10-CM | POA: Insufficient documentation

## 2022-08-23 DIAGNOSIS — Z720 Tobacco use: Secondary | ICD-10-CM | POA: Insufficient documentation

## 2022-08-23 DIAGNOSIS — R9431 Abnormal electrocardiogram [ECG] [EKG]: Secondary | ICD-10-CM | POA: Insufficient documentation

## 2022-08-23 DIAGNOSIS — Z0100 Encounter for examination of eyes and vision without abnormal findings: Secondary | ICD-10-CM | POA: Insufficient documentation

## 2022-08-23 DIAGNOSIS — Z136 Encounter for screening for cardiovascular disorders: Secondary | ICD-10-CM | POA: Diagnosis not present

## 2022-08-26 ENCOUNTER — Encounter: Payer: Self-pay | Admitting: Oncology

## 2022-09-06 DIAGNOSIS — M150 Primary generalized (osteo)arthritis: Secondary | ICD-10-CM | POA: Diagnosis not present

## 2022-09-06 DIAGNOSIS — M47816 Spondylosis without myelopathy or radiculopathy, lumbar region: Secondary | ICD-10-CM | POA: Diagnosis not present

## 2022-09-06 DIAGNOSIS — M47812 Spondylosis without myelopathy or radiculopathy, cervical region: Secondary | ICD-10-CM | POA: Diagnosis not present

## 2022-09-06 DIAGNOSIS — M545 Low back pain, unspecified: Secondary | ICD-10-CM | POA: Diagnosis not present

## 2022-09-11 DIAGNOSIS — R1084 Generalized abdominal pain: Secondary | ICD-10-CM | POA: Diagnosis not present

## 2022-09-11 DIAGNOSIS — R100 Acute abdomen: Secondary | ICD-10-CM | POA: Diagnosis not present

## 2022-09-13 DIAGNOSIS — R109 Unspecified abdominal pain: Secondary | ICD-10-CM | POA: Diagnosis not present

## 2022-09-13 DIAGNOSIS — K5900 Constipation, unspecified: Secondary | ICD-10-CM | POA: Insufficient documentation

## 2022-09-13 DIAGNOSIS — I700 Atherosclerosis of aorta: Secondary | ICD-10-CM | POA: Diagnosis not present

## 2022-09-13 DIAGNOSIS — C9591 Leukemia, unspecified, in remission: Secondary | ICD-10-CM | POA: Diagnosis not present

## 2022-09-13 DIAGNOSIS — I10 Essential (primary) hypertension: Secondary | ICD-10-CM | POA: Diagnosis not present

## 2022-09-13 DIAGNOSIS — R1912 Hyperactive bowel sounds: Secondary | ICD-10-CM | POA: Insufficient documentation

## 2022-09-17 ENCOUNTER — Encounter: Payer: Self-pay | Admitting: Oncology

## 2022-09-20 ENCOUNTER — Inpatient Hospital Stay: Payer: BC Managed Care – PPO | Attending: Hematology and Oncology

## 2022-09-20 ENCOUNTER — Other Ambulatory Visit: Payer: Self-pay | Admitting: Hematology and Oncology

## 2022-09-20 DIAGNOSIS — C3492 Malignant neoplasm of unspecified part of left bronchus or lung: Secondary | ICD-10-CM

## 2022-09-20 DIAGNOSIS — C9210 Chronic myeloid leukemia, BCR/ABL-positive, not having achieved remission: Secondary | ICD-10-CM | POA: Insufficient documentation

## 2022-09-20 DIAGNOSIS — C249 Malignant neoplasm of biliary tract, unspecified: Secondary | ICD-10-CM

## 2022-09-20 DIAGNOSIS — Z85118 Personal history of other malignant neoplasm of bronchus and lung: Secondary | ICD-10-CM | POA: Diagnosis not present

## 2022-09-20 LAB — CMP (CANCER CENTER ONLY)
ALT: 29 U/L (ref 0–44)
AST: 44 U/L — ABNORMAL HIGH (ref 15–41)
Albumin: 3.2 g/dL — ABNORMAL LOW (ref 3.5–5.0)
Alkaline Phosphatase: 58 U/L (ref 38–126)
Anion gap: 10 (ref 5–15)
BUN: 7 mg/dL — ABNORMAL LOW (ref 8–23)
CO2: 22 mmol/L (ref 22–32)
Calcium: 8.2 mg/dL — ABNORMAL LOW (ref 8.9–10.3)
Chloride: 100 mmol/L (ref 98–111)
Creatinine: 0.75 mg/dL (ref 0.61–1.24)
GFR, Estimated: >60 mL/min (ref >60)
Glucose, Bld: 86 mg/dL (ref 70–99)
Potassium: 3.6 mmol/L (ref 3.5–5.1)
Sodium: 132 mmol/L — ABNORMAL LOW (ref 135–145)
Total Bilirubin: 0.7 mg/dL (ref 0.3–1.2)
Total Protein: 6.5 g/dL (ref 6.5–8.1)

## 2022-09-20 LAB — CBC WITH DIFFERENTIAL (CANCER CENTER ONLY)
Abs Immature Granulocytes: 0.01 10*3/uL (ref 0.00–0.07)
Basophils Absolute: 0.1 10*3/uL (ref 0.0–0.1)
Basophils Relative: 1 %
Eosinophils Absolute: 0.1 10*3/uL (ref 0.0–0.5)
Eosinophils Relative: 2 %
HCT: 38.1 % — ABNORMAL LOW (ref 39.0–52.0)
Hemoglobin: 13.4 g/dL (ref 13.0–17.0)
Immature Granulocytes: 0 %
Lymphocytes Relative: 19 %
Lymphs Abs: 1.1 10*3/uL (ref 0.7–4.0)
MCH: 33.9 pg (ref 26.0–34.0)
MCHC: 35.2 g/dL (ref 30.0–36.0)
MCV: 96.5 fL (ref 80.0–100.0)
Monocytes Absolute: 1 10*3/uL (ref 0.1–1.0)
Monocytes Relative: 17 %
Neutro Abs: 3.6 10*3/uL (ref 1.7–7.7)
Neutrophils Relative %: 61 %
Platelet Count: 215 10*3/uL (ref 150–400)
RBC: 3.95 MIL/uL — ABNORMAL LOW (ref 4.22–5.81)
RDW: 14.3 % (ref 11.5–15.5)
WBC Count: 5.9 10*3/uL (ref 4.0–10.5)
nRBC: 0 % (ref 0.0–0.2)

## 2022-09-21 LAB — CEA: CEA: 3.6 ng/mL (ref 0.0–4.7)

## 2022-09-21 LAB — CANCER ANTIGEN 19-9: CA 19-9: 13 U/mL (ref 0–35)

## 2022-09-27 ENCOUNTER — Telehealth: Payer: Self-pay | Admitting: Oncology

## 2022-09-27 ENCOUNTER — Ambulatory Visit: Payer: BC Managed Care – PPO | Admitting: Oncology

## 2022-09-27 NOTE — Progress Notes (Incomplete)
[facility]  [address] [place],  [place]  [address] [phone]  Clinic Day:  09/27/2022  Referring physician: Barnetta Chapel, NP  This document serves as a record of services personally performed by Hosie Poisson, MD. It was created on their behalf by Curry,Lauren E, a trained medical scribe. The creation of this record is based on the scribe's personal observations and the provider's statements to them.  ASSESSMENT & PLAN:   Assessment & Plan: 1. 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  He had a mild relapse in May 2018, but by that August, the testing returned to complete molecular remission and he has remained in complete molecular remission since that time. PCR for BCR/ABL is pending from today. He knows to continue dasatinib daily.    2. History of stage IA non-small cell lung cancer in 2016 treated with surgery alone.  CT imaging in October 2022 did not reveal any evidence of recurrence.    3. History of stage IA cholangiocarcinoma in 2014 treated with surgery alone.  He has stable intermittent abdominal pain, which may be related to dasatinib, but we suspect this could represent adhesions from surgery.  CT imaging in October 2022 did not reveal any evidence of recurrence.   4. Rare tobacco use, but I once again advised him on the importance of complete abstinence from tobacco.    5. Mild hypocalcemia, I advised that he start oral supplement BID.    6. B12 deficiency.  He remains on B12 injections monthly with his primary care office.   7. History of gastric ulcers with H pylori infection.  This was treated appropriately by Dr. Lyndel Safe and he is on omeprazole.  He does not have symptoms of recurrence.     8.  Right rotator cuff shoulder surgery in early 2022.    Plan He knows to continue dasatinib daily. He knows to start oral calcium BID. We will plan to see him back in 3 months with a CBC, comprehensive metabolic panel and PCR for  BCR/ABL.  He will be due for his yearly scans in the fall. The patient understands the plans discussed today and is in agreement with them.  He knows to contact our office if he develops concerns prior to his next appointment.   I provided 15 minutes of face-to-face time during this this encounter and > 50% was spent counseling as documented under my assessment and plan.    [facility] [address] [place] [address] Dept: [phone] Dept Fax: [phone]   No orders of the defined types were placed in this encounter.     CHIEF COMPLAINT:  CC: Chronic myelogenous leukemia with history of lung cancer and cholangiocarcinoma  Current Treatment: Dasatinib 100 mg daily   HISTORY OF PRESENT ILLNESS:  Chronic myelogenous leukemia diagnosed in October 2015 when he presented with leukocytosis.  He was originally placed on nilotinib, but developed a severe pruritic rash, so was switched to dasatinib 100 mg daily.  He achieved a major molecular remission by May 2016.   He also has a history of a stage IA cholangiocarcinoma, treated with surgical resection in June 2014.   He then underwent surgical resection of a stage IA (T1 N0 M0) non-small cell lung cancer in February 2016.  Pathology revealed a 3 cm, well-differentiated, adenocarcinoma with negative nodes.  Adjuvant chemotherapy for his lung cancer was not recommended.  He  has chronic back pain and continues seeing spine specialist.  He apparently has significant degenerative disc disease.  He has also had upper abdominal pain, which he attributes to dasatinib.  He uses hydrocodone/APAP as needed for his pain.  In May 2018, PCR was positive at 0.012%, consistent with residual CML.  Repeat PCR in August 2018 revealed a major molecular response.  His CML has remained in remission since that time.  CT chest, abdomen and pelvis in November 2019 did not reveal  evidence of recurrence of either malignancy.     CT chest in August 2020 did not reveal any evidence of recurrent lung malignancy.  He has had hypocalcemia, so was instructed to take calcium 600 mg twice daily.  When he was seen in November 2020, had been having lower leg pain worsening throughout the day.  He underwent arterial ultrasound/ankle brachial indices, which was normal.  The pain was then felt to possibly be degenerative in nature.  He reported constipation due to the calcium and as his on calcium was normal at that time,  we decreased the calcium to daily.  He had worsening neutropenia and was found to have B12 and placed on B12 injections weekly for 4 weeks, then monthly.  Serum folate was normal.  He had persistent abdominal pain, so was referred to Dr. Lyndel Safe.  He was seen by Dr. Lyndel Safe in 2021 and had 2 gastric ulcers and H. Pylori infection, which was treated.  He saw Dr. Nila Nephew earlier in 2021 as well, and PSA was normal. When he was seen in August, he had recurrent hypocalcemia, with a calcium of 8.3.  He could not tolerate the calcium supplement, so we recommended that he take Tums 4 times daily.  He continued to complain of intermittent abdominal pain, which we have attributed to adhesions.  He has had rotator cuff surgery in January 2022.  He has had a colonoscopy by Dr. Lyndel Safe with findings of a large polyp.  He will be going back on August 8th to have this resected through colonoscopy.  He missed his appointment in May but Dr. Lyndel Safe told him "his labs were good".  I looked up and his PCR for BCR/ABL continues to be negative on the May reading.  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[place])   03/06/2013 Cancer Staging   Staging form: Distal Bile Ducts, AJCC 7th Edition - Clinical stage from 03/06/2013: Stage IA (T1, N0, M0) - Signed by [name], MD on 11/09/2020 Staged by: Managing physician Diagnostic  confirmation: Positive histology Specimen type: Excision Histopathologic type: Cholangiocarcinoma Stage prefix: Initial diagnosis Lymph-vascular invasion (LVI): LVI not present (absent)/not identified Residual tumor (R): R0 - None Stage used in treatment planning: Yes National guidelines used in treatment planning: Yes Type of national guideline used in treatment planning: NCCN   Non-small cell carcinoma of lung ([facility])  11/08/2014 Cancer Staging   Staging form: Lung, AJCC 7th Edition - Clinical stage from 11/08/2014: Stage IA (T1b, N0, M0) - Signed by [name], MD on 11/09/2020 Staged by: Managing physician Diagnostic confirmation: Positive histology Specimen type: Excision Histopathologic type: Adenocarcinoma, NOS Stage prefix: Initial diagnosis Laterality: Left Tumor size (mm): 30 Histologic grade (G): G1 Lymph-vascular invasion (LVI): LVI not present (absent)/not identified Residual tumor (R): R0 - None Pleural/elastic layer invasion: PL0 Prognostic indicators: Resection LUL Stage used in treatment planning: Yes National guidelines used in treatment planning: Yes Type of national  guideline used in treatment planning: NCCN   01/31/2015 Initial Diagnosis   Non-small cell carcinoma of lung ([facility])      INTERVAL HISTORY:  Hoby is here for routine follow up and states that he has been well and denies complaints.      His abdominal pain is much better since starting omeprazole.    He denies any adenopathy. He continues dasatinib daily without difficulty. He continues oral vitamin D. He is not currently on oral calcium.   Hemoglobin has mildly decreased from 14.2 to 13.5 and white count and platelets are normal.   Chemistries are unremarkable except for a sodium of 133, and a calcium of 8.3.   I advised that he start oral calcium supplement BID.   His  appetite is fair, and he has lost nearly 8 pounds since his last visit.  He denies fever, chills or other  signs of infection.  He denies nausea, vomiting, bowel issues, or abdominal pain.  He denies sore throat, cough, dyspnea, or chest pain.  REVIEW OF SYSTEMS:  Review of Systems  Constitutional: Negative.  Negative for appetite change, chills, fatigue, fever and unexpected weight change.  HENT:  Negative.    Eyes: Negative.   Respiratory: Negative.  Negative for chest tightness, cough, hemoptysis, shortness of breath and wheezing.   Cardiovascular: Negative.  Negative for chest pain, leg swelling and palpitations.  Gastrointestinal: Negative.  Negative for abdominal distention, abdominal pain, blood in stool, constipation, diarrhea, nausea and vomiting.  Endocrine: Negative.   Genitourinary: Negative.  Negative for difficulty urinating, dysuria, frequency and hematuria.   Musculoskeletal: Negative.  Negative for arthralgias, back pain, flank pain, gait problem and myalgias.  Skin: Negative.   Neurological: Negative.  Negative for dizziness, extremity weakness, gait problem, headaches, light-headedness, numbness, seizures and speech difficulty.  Hematological: Negative.   Psychiatric/Behavioral: Negative.  Negative for depression and sleep disturbance. The patient is not nervous/anxious.   All other systems reviewed and are negative.    VITALS:  There were no vitals taken for this visit.  Wt Readings from Last 3 Encounters:  06/27/22 221 lb 8 oz (100.5 kg)  02/27/22 219 lb 1.6 oz (99.4 kg)  01/02/22 218 lb (98.9 kg)    There is no height or weight on file to calculate BMI.  Performance status (ECOG): 0 - Asymptomatic  PHYSICAL EXAM:  Physical Exam Vitals and nursing note reviewed.  Constitutional:      General: He is not in acute distress.    Appearance: Normal appearance. He is normal weight.  HENT:     Head: Normocephalic and atraumatic.     Mouth/Throat:     Mouth: Mucous membranes are moist.     Pharynx: Oropharynx is clear. No oropharyngeal exudate or posterior  oropharyngeal erythema.  Eyes:     General: No scleral icterus.    Extraocular Movements: Extraocular movements intact.     Conjunctiva/sclera: Conjunctivae normal.     Pupils: Pupils are equal, round, and reactive to light.  Cardiovascular:     Rate and Rhythm: Normal rate and regular rhythm.     Pulses: Normal pulses.     Heart sounds: Normal heart sounds. No murmur heard.    No friction rub. No gallop.  Pulmonary:     Effort: Pulmonary effort is normal. No respiratory distress.     Breath sounds: Normal breath sounds. No wheezing, rhonchi or rales.  Abdominal:     General: Bowel sounds are normal. There is no distension.  Palpations: Abdomen is soft. There is no hepatomegaly, splenomegaly or mass.     Tenderness: There is no abdominal tenderness.  Musculoskeletal:        General: Normal range of motion.     Cervical back: Normal range of motion and neck supple. No tenderness.     Right lower leg: No edema.     Left lower leg: No edema.  Lymphadenopathy:     Cervical: No cervical adenopathy.     Upper Body:     Right upper body: No supraclavicular or axillary adenopathy.     Left upper body: No supraclavicular or axillary adenopathy.     Lower Body: No right inguinal adenopathy. No left inguinal adenopathy.  Skin:    General: Skin is warm and dry.     Coloration: Skin is not jaundiced.     Findings: No rash.  Neurological:     General: No focal deficit present.     Mental Status: He is alert and oriented to person, place, and time. Mental status is at baseline.     Cranial Nerves: No cranial nerve deficit.  Psychiatric:        Mood and Affect: Mood normal.        Behavior: Behavior normal.        Thought Content: Thought content normal.        Judgment: Judgment normal.     LABS:      Latest Ref Rng & Units 09/20/2022    9:20 AM 06/27/2022   12:00 AM 05/29/2022   12:00 AM  CBC  WBC 4.0 - 10.5 K/uL 5.9  5.8     3.8      Hemoglobin 13.0 - 17.0 g/dL 13.4  14.9      13.9      Hematocrit 39.0 - 52.0 % 38.1  42     40      Platelets 150 - 400 K/uL 215  229     226         This result is from an external source.       Latest Ref Rng & Units 09/20/2022    9:20 AM 06/27/2022   12:00 AM 05/29/2022   12:00 AM  CMP  Glucose 70 - 99 mg/dL 86     BUN 8 - 23 mg/dL 7  7     10       Creatinine 0.61 - 1.24 mg/dL 0.75  0.6     0.6      Sodium 135 - 145 mmol/L 132  132     134      Potassium 3.5 - 5.1 mmol/L 3.6  3.9     4.2      Chloride 98 - 111 mmol/L 100  100     101      CO2 22 - 32 mmol/L 22  25     30       Calcium 8.9 - 10.3 mg/dL 8.2  8.5     8.5      Total Protein 6.5 - 8.1 g/dL 6.5     Total Bilirubin 0.3 - 1.2 mg/dL 0.7     Alkaline Phos 38 - 126 U/L 58  56  C    62      AST 15 - 41 U/L 44  54     51      ALT 0 - 44 U/L 29  29     30  C Corrected result   This result is from an external source.      Lab Results  Component Value Date   CEA1 3.6 09/20/2022   /  CEA  Date Value Ref Range Status  09/20/2022 3.6 0.0 - 4.7 ng/mL Final    Comment:    (NOTE)                             Nonsmokers          <3.9                             Smokers             <5.6 [employer] Electrochemiluminescence Immunoassay (ECLIA) Values obtained with different assay methods or kits cannot be used interchangeably.  Results cannot be interpreted as absolute evidence of the presence or absence of malignant disease. Performed At: [facility] [place], [place] [id number] Rush Farmer MD ES:[id number]     Lab Results  Component Value Date   QJF354 13 09/20/2022    STUDIES:       HISTORY:   Allergies:  Allergies  Allergen Reactions   Pneumococcal Vaccines Swelling    Current Medications: Current Outpatient Medications  Medication Sig Dispense Refill   albuterol (PROVENTIL HFA;VENTOLIN HFA) 108 (90 BASE) MCG/ACT inhaler Inhale 2 puffs into the lungs every 6 (six) hours as needed for wheezing or  shortness of breath.     allopurinol (ZYLOPRIM) 300 MG tablet Take 300 mg by mouth daily.     Calcium Carbonate (CALCIUM 500 PO) Take 1 tablet by mouth 3 (three) times daily.     Cholecalciferol (VITAMIN D) 50 MCG (2000 UT) tablet Take 2,000 Units by mouth daily.     colchicine 0.6 MG tablet Take 1 tablet every day by oral route as directed for 3 days.     cyclobenzaprine (FLEXERIL) 5 MG tablet Take 5 mg by mouth at bedtime as needed for muscle spasms.     diclofenac Sodium (VOLTAREN) 1 % GEL Apply 2 g topically at bedtime.     docusate sodium (COLACE) 100 MG capsule Take 200 mg by mouth daily.     fluticasone (FLONASE) 50 MCG/ACT nasal spray Place 2 sprays into both nostrils daily.     HYDROcodone-acetaminophen (NORCO/VICODIN) 5-325 MG tablet Take 1 tablet by mouth 4 (four) times daily as needed.     irbesartan-hydrochlorothiazide (AVALIDE) 150-12.5 MG tablet Take 1 tablet by mouth daily.     methocarbamol (ROBAXIN) 500 MG tablet Take 500 mg by mouth daily.     omeprazole (PRILOSEC) 40 MG capsule Take 1 capsule (40 mg total) by mouth daily. 60 capsule 4   SPRYCEL 100 MG tablet TAKE 1 TABLET BY MOUTH DAILY 30 tablet 5   tamsulosin (FLOMAX) 0.4 MG CAPS capsule Take 0.8 mg by mouth daily.     TRELEGY ELLIPTA 100-62.5-25 MCG/INH AEPB Inhale 1 puff into the lungs daily at 6 (six) AM.     No current facility-administered medications for this visit.      I,Jasmine M Lassiter,acting as a scribe for [name], MD.,have documented all relevant documentation on the behalf of [name], MD,as directed by  [name], MD while in the presence of [name], MD.

## 2022-09-27 NOTE — Telephone Encounter (Signed)
09/27/22 Appt rescheduled.Patient sick

## 2022-09-29 LAB — BCR-ABL1, CML/ALL, PCR, QUANT: Interpretation (BCRAL):: NEGATIVE

## 2022-10-03 ENCOUNTER — Encounter: Payer: Self-pay | Admitting: Hematology and Oncology

## 2022-10-03 ENCOUNTER — Telehealth: Payer: Self-pay | Admitting: Hematology and Oncology

## 2022-10-03 ENCOUNTER — Inpatient Hospital Stay: Payer: BC Managed Care – PPO | Attending: Hematology and Oncology | Admitting: Hematology and Oncology

## 2022-10-03 VITALS — BP 160/90 | HR 72 | Temp 98.3°F | Resp 20 | Ht 70.0 in | Wt 216.4 lb

## 2022-10-03 DIAGNOSIS — C9210 Chronic myeloid leukemia, BCR/ABL-positive, not having achieved remission: Secondary | ICD-10-CM

## 2022-10-03 DIAGNOSIS — C249 Malignant neoplasm of biliary tract, unspecified: Secondary | ICD-10-CM | POA: Diagnosis not present

## 2022-10-03 DIAGNOSIS — C3492 Malignant neoplasm of unspecified part of left bronchus or lung: Secondary | ICD-10-CM

## 2022-10-03 DIAGNOSIS — I7121 Aneurysm of the ascending aorta, without rupture: Secondary | ICD-10-CM | POA: Diagnosis not present

## 2022-10-03 NOTE — Telephone Encounter (Signed)
10/03/22 Next appt scheduled and confirmed with patient

## 2022-10-03 NOTE — Progress Notes (Signed)
Kyle Montoya  [address] [place],  [place]  [address] [phone]  Clinic Day:  10/03/2022  Referring physician: Barnetta Chapel, NP  ASSESSMENT & PLAN:   Assessment & Plan: CML (chronic myelocytic leukemia) ([place]) CML originally diagnosed in 2016.  PCR for BCR/ABL remains negative.  His disease remains in complete molecular remission with dasatinib 100 mg daily.  He knows to continue dasatinib daily.  We will plan to see him back in 3 months with a CBC, comprehensive metabolic panel and PCR for BCR/ABL.  Carcinoma of biliary tract ([place]) CT abdomen and pelvis in June did not reveal any evidence of recurrence.  CEA and CA 19-9 remain normal.  Non-small cell carcinoma of lung (HCC) Stage I adenocarcinoma of the left upper lobe of the lung.  He was treated with lobectomy in February 2016.  CT imaging in September did not reveal any evidence of recurrence.  Thoracic aortic aneurysm [facility]) Recent CT imaging revealed stable mild dilatation of the ascending thoracic aorta measuring 4.1 cm. Recommend annual imaging followup by CTA or MRA.  We will plan to follow this with CTA chest in 1 year.  Hypocalcemia He has recurrent hypocalcemia.  He has only been taking his calcium 600 mg twice a day.  I recommended that he increase this to 3 times daily again.  He continues vitamin D 2000 international unit units daily. Vitamin D was normal in June.  He knows to continue calcium as recommended.   The patient understands the plans discussed today and is in agreement with them.  He knows to contact our office if he develops concerns prior to his next appointment.      Marvia Pickles, PA-C  [facility] AT [facility] [address] [place] [address] Dept: [phone] Dept Fax: [phone]   Orders Placed This Encounter  Procedures   CBC with Differential ([place] Only)    Standing Status:   Future     Standing Expiration Date:   10/04/2023   CMP ([place] only)    Standing Status:   Future    Standing Expiration Date:   10/04/2023   BCR-ABL1, CML/ALL, PCR, QUANT    Standing Status:   Future    Standing Expiration Date:   10/04/2023   Cancer antigen 19-9    Standing Status:   Future    Standing Expiration Date:   10/04/2023   CEA    Standing Status:   Future    Standing Expiration Date:   10/04/2023      CHIEF COMPLAINT:  CC: CML  Current Treatment:  Dasatinib 100 mg daily  HISTORY OF PRESENT ILLNESS:   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[place])  02/15/2013 Initial Diagnosis   Carcinoma of biliary tract ([place])   03/06/2013 Cancer Staging   Staging form: Distal Bile Ducts, AJCC 7th Edition - Clinical stage from 03/06/2013: Stage IA (T1, N0, M0) - Signed by [name], MD on 11/09/2020 Staged by: Managing physician Diagnostic confirmation: Positive histology Specimen type: Excision Histopathologic type: Cholangiocarcinoma Stage prefix: Initial diagnosis Lymph-vascular invasion (LVI): LVI not present (absent)/not identified Residual tumor (R): R0 - None Stage used in treatment planning: Yes National guidelines used in treatment planning: Yes Type of national guideline used in treatment planning: NCCN   Non-small cell carcinoma of lung ([employer])  11/08/2014 Cancer Staging   Staging form: Lung, AJCC 7th Edition - Clinical stage from 11/08/2014: Stage IA (  T1b, N0, M0) - Signed by Dellia Beckwith, MD on 11/09/2020 Staged by: Managing physician Diagnostic confirmation: Positive histology Specimen type: Excision Histopathologic type: Adenocarcinoma, NOS Stage prefix: Initial diagnosis Laterality: Left Tumor size (mm): 30 Histologic grade (G): G1 Lymph-vascular invasion (LVI): LVI not present (absent)/not identified Residual tumor (R): R0 - None Pleural/elastic layer invasion: PL0 Prognostic indicators: Resection LUL Stage used in treatment planning:  Yes National guidelines used in treatment planning: Yes Type of national guideline used in treatment planning: NCCN   01/31/2015 Initial Diagnosis   Non-small cell carcinoma of lung (HCC)       INTERVAL HISTORY:  Doug is here today for repeat clinical assessment and states he has been doing fairly well.  He has been having abdominal pain.  When he saw his PCP in December, he had abdominal tenderness, so a CT abdomen and pelvis was done.  This did not reveal any evidence of malignancy or other acute abnormality.  He states omeprazole previously helped.  He is scheduled to see Dr. Chales Abrahams this month.  He states he had a cold recently, which has resolved.  He denies fevers or chills. He denies pain. His appetite is good, but he often does not feel like cooking.  He is occasionally drinking Ensure. His weight has decreased 5 pounds over last 3 months .  He states he continues to drink 4 beers daily.  About 6 months ago, he was drinking a pint of bourbon a day.  He continues to care for his wife, as well as work part-time.  Labs on December 29th revealed white blood cell count of 5.9 with a normal differential.  Hemoglobin and platelets were normal.  Chemistries revealed persistent mild hyponatremia and recurrent mild hypocalcemia.  There was mild elevation of the AST, which had improved.  His albumin had dropped to 3.2.  PCR for BCR/ABL was negative.  REVIEW OF SYSTEMS:  Review of Systems  Constitutional:  Positive for unexpected weight change. Negative for appetite change, chills, fatigue and fever.  HENT:   Negative for lump/mass, mouth sores and sore throat.   Respiratory:  Negative for cough and shortness of breath.   Cardiovascular:  Negative for chest pain and leg swelling.  Gastrointestinal:  Positive for abdominal pain. Negative for constipation, diarrhea, nausea and vomiting.  Genitourinary:  Negative for difficulty urinating, dysuria, frequency and hematuria.   Musculoskeletal:  Positive  for back pain. Negative for arthralgias and myalgias.  Skin:  Negative for itching, rash and wound.  Neurological:  Negative for dizziness, extremity weakness, headaches, light-headedness and numbness.  Hematological:  Negative for adenopathy.  Psychiatric/Behavioral:  Negative for depression and sleep disturbance. The patient is not nervous/anxious.      VITALS:  Blood pressure (!) 160/90, pulse 72, temperature 98.3 F (36.8 C), temperature source Oral, resp. rate 20, height 5\' 10"  (1.778 m), weight 216 lb 6.4 oz (98.2 kg), SpO2 99 %.  Wt Readings from Last 3 Encounters:  10/03/22 216 lb 6.4 oz (98.2 kg)  06/27/22 221 lb 8 oz (100.5 kg)  02/27/22 219 lb 1.6 oz (99.4 kg)    Body mass index is 31.05 kg/m.  Performance status (ECOG): 1 - Symptomatic but completely ambulatory  PHYSICAL EXAM:  Physical Exam Vitals and nursing note reviewed.  Constitutional:      General: He is not in acute distress.    Appearance: Normal appearance. He is normal weight.  HENT:     Head: Normocephalic and atraumatic.     Mouth/Throat:  Mouth: Mucous membranes are moist.     Pharynx: Oropharynx is clear. No oropharyngeal exudate or posterior oropharyngeal erythema.  Eyes:     General: No scleral icterus.    Extraocular Movements: Extraocular movements intact.     Conjunctiva/sclera: Conjunctivae normal.     Pupils: Pupils are equal, round, and reactive to light.  Cardiovascular:     Rate and Rhythm: Normal rate and regular rhythm.     Heart sounds: Normal heart sounds. No murmur heard.    No friction rub. No gallop.  Pulmonary:     Effort: Pulmonary effort is normal.     Breath sounds: Normal breath sounds. No wheezing, rhonchi or rales.  Abdominal:     General: Bowel sounds are normal. There is no distension.     Palpations: Abdomen is soft. There is no hepatomegaly, splenomegaly or mass.     Tenderness: There is no abdominal tenderness.  Musculoskeletal:        General: Normal range of  motion.     Cervical back: Normal range of motion and neck supple. No tenderness.     Right lower leg: No edema.     Left lower leg: No edema.  Lymphadenopathy:     Cervical: No cervical adenopathy.     Upper Body:     Right upper body: No supraclavicular or axillary adenopathy.     Left upper body: No supraclavicular or axillary adenopathy.     Lower Body: No right inguinal adenopathy. No left inguinal adenopathy.  Skin:    General: Skin is warm and dry.     Coloration: Skin is not jaundiced.     Findings: No rash.  Neurological:     Mental Status: He is alert and oriented to person, place, and time.     Cranial Nerves: No cranial nerve deficit.  Psychiatric:        Mood and Affect: Mood normal.        Behavior: Behavior normal.        Thought Content: Thought content normal.     LABS:      Latest Ref Rng & Units 09/20/2022    9:20 AM 06/27/2022   12:00 AM 05/29/2022   12:00 AM  CBC  WBC 4.0 - 10.5 K/uL 5.9  5.8     3.8      Hemoglobin 13.0 - 17.0 g/dL 13.4  14.9     13.9      Hematocrit 39.0 - 52.0 % 38.1  42     40      Platelets 150 - 400 K/uL 215  229     226         This result is from an external source.      Latest Ref Rng & Units 09/20/2022    9:20 AM 06/27/2022   12:00 AM 05/29/2022   12:00 AM  CMP  Glucose 70 - 99 mg/dL 86     BUN 8 - 23 mg/dL 7  7     10       Creatinine 0.61 - 1.24 mg/dL 0.75  0.6     0.6      Sodium 135 - 145 mmol/L 132  132     134      Potassium 3.5 - 5.1 mmol/L 3.6  3.9     4.2      Chloride 98 - 111 mmol/L 100  100     101      CO2 22 - 32 mmol/L  22  25     30       Calcium 8.9 - 10.3 mg/dL 8.2  8.5     8.5      Total Protein 6.5 - 8.1 g/dL 6.5     Total Bilirubin 0.3 - 1.2 mg/dL 0.7     Alkaline Phos 38 - 126 U/L 58  56  C    62      AST 15 - 41 U/L 44  54     51      ALT 0 - 44 U/L 29  29     30         C Corrected result   This result is from an external source.     Lab Results  Component Value Date   CEA1 3.6 09/20/2022    /  CEA  Date Value Ref Range Status  09/20/2022 3.6 0.0 - 4.7 ng/mL Final    Comment:    (NOTE)                             Nonsmokers          <3.9                             Smokers             <5.6 [employer] Electrochemiluminescence Immunoassay (ECLIA) Values obtained with different assay methods or kits cannot be used interchangeably.  Results cannot be interpreted as absolute evidence of the presence or absence of malignant disease. Performed At: [facility] [address] [place], [address] [place] [id number]    Lab Results  Component Value Date   Jolene Schimke 09/20/2022    STUDIES:  Patient Name: AYDYN, TESTERMAN SR Account # 09/22/2022   Exam(s): Tarri Abernethy CT/CT ABD-PELV W/IV CM  CLINICAL DATA: Abdominal pain, concern for bowel obstruction.   EXAM:  CT ABDOMEN AND PELVIS WITH CONTRAST   TECHNIQUE:  Multidetector CT imaging of the abdomen and pelvis was performed  using the standard protocol following bolus administration of  intravenous contrast.   RADIATION DOSE REDUCTION: This exam was performed according to the  departmental dose-optimization program which includes automated  exposure control, adjustment of the mA and/or kV according to  patient size and/or use of iterative reconstruction technique.   CONTRAST: 100 mL of Isovue 370   COMPARISON: CT examination dated March 06, 2022   FINDINGS:  Lower chest: No acute abnormality.  Hepatobiliary: No focal liver abnormality is seen. Status post  cholecystectomy. No biliary dilatation.  Pancreas: Postsurgical changes for prior Whipple's procedure. No  pancreatic ductal dilatation or surrounding inflammatory changes.  Spleen: Normal in size without focal abnormality.  Adrenals/Urinary Tract: Left adrenal adenoma measuring up to 1.1 cm,  unchanged. Malrotated right kidney. Evidence of renal mass,  nephrolithiasis or hydronephrosis.. Bladder is unremarkable.   Stomach/Bowel: Stomach is within normal limits. Appendix appears  normal. No evidence of bowel wall thickening, distention, or  inflammatory changes.  Vascular/Lymphatic: Aortic atherosclerosis. No enlarged abdominal or  pelvic lymph nodes. Courtesy Copy to: Diagnostic Imaging Report   Reproductive: Prostate is unremarkable.  Other: No abdominal wall hernia or abnormality. No abdominopelvic  ascites.  Musculoskeletal: Degenerate disc disease of the lumbar spine. No  suspicious osseous lesion. No acute osseous abnormality.   IMPRESSION:  1. No CT evidence of acute abdominal/pelvic process. Bowel loops are  normal in caliber. No evidence of obstruction, colitis or  diverticulitis.  2. Postsurgical changes for prior Whipple's procedure.  3. Stable left adrenal adenoma measuring up to 1.1 cm.  4. Degenerate disc disease of the lumbar spine.  5. Aortic atherosclerosis.  Aortic Atherosclerosis (ICD10-I70.0).    HISTORY:   Past Medical History:  Diagnosis Date   Abnormal transaminases 02/27/2022   Arthritis    on PRN meds   Asthma    uses inhaler   BPH (benign prostatic hyperplasia)    CAD (coronary artery disease)    Cataract    bilateral sx   Cholangiocarcinoma of biliary tract (HCC)    Chronic myelogenous leukemia (HCC)    COPD (chronic obstructive pulmonary disease) (HCC)    uses inhaler   DJD (degenerative joint disease)    Dyslipidemia    Family history of colonic polyps    Family history of prostate cancer    Gout    HTN (hypertension)    on meds   Hypercholesterolemia    Lung cancer (HCC)    Myelogenous leukemia (HCC)    OA (osteoarthritis)    PAD (peripheral artery disease) (HCC)    Personal history of colonic polyps    Renal insufficiency    Tendonitis    patellar   Thoracic aortic aneurysm (HCC) 06/27/2022    Past Surgical History:  Procedure Laterality Date   BIOPSY  04/30/2021   Procedure: BIOPSY;  Surgeon: Meridee Score Netty Starring., MD;  Location: [facility]  ENDOSCOPY;  Service: Gastroenterology;;   Bowel duct surg  2014   Bowel cancer   CARDIAC CATHETERIZATION  2000   CHOLECYSTECTOMY     COLONOSCOPY  06/23/2015   mild sigmoid diverticulosis. small external and internal hemorrhoids. otherwise normal colonoscopy to terminal ileum   COLONOSCOPY WITH PROPOFOL N/A 04/30/2021   Procedure: COLONOSCOPY WITH PROPOFOL;  Surgeon: Meridee Score Netty Starring., MD;  Location: [facility];  Service: Gastroenterology;  Laterality: N/A;   ENDOSCOPIC MUCOSAL RESECTION N/A 04/30/2021   Procedure: ENDOSCOPIC MUCOSAL RESECTION;  Surgeon: Meridee Score Netty Starring., MD;  Location: [facility] ENDOSCOPY;  Service: Gastroenterology;  Laterality: N/A;   ESOPHAGOGASTRODUODENOSCOPY (EGD) WITH PROPOFOL N/A 04/30/2021   Procedure: ESOPHAGOGASTRODUODENOSCOPY (EGD) WITH PROPOFOL;  Surgeon: Meridee Score Netty Starring., MD;  Location: [facility] ENDOSCOPY;  Service: Gastroenterology;  Laterality: N/A;   HEMOSTASIS CLIP PLACEMENT  04/30/2021   Procedure: HEMOSTASIS CLIP PLACEMENT;  Surgeon: Lemar Lofty., MD;  Location: [facility];  Service: Gastroenterology;;   LUNG CANCER SURGERY  Jan. 2016   partial lobectomy   POLYPECTOMY  04/30/2021   Procedure: POLYPECTOMY;  Surgeon: Mansouraty, Netty Starring., MD;  Location: [facility];  Service: Gastroenterology;;   SHOULDER OPEN ROTATOR CUFF REPAIR Right 09/27/2020   Procedure: Right shoulder mini open rotator cuff repair, distal clavicle resection;  Surgeon: Jene Every, MD;  Location: [facility] ORS;  Service: Orthopedics;  Laterality: Right;  90 mins  Choice with Block anesthesia   SUBMUCOSAL LIFTING INJECTION  04/30/2021   Procedure: SUBMUCOSAL LIFTING INJECTION;  Surgeon: Lemar Lofty., MD;  Location: [facility] END[facility];  Service: Gastroenterology;;   wipple procedure     stage I- due to bile duct cancer-2014 at [facility] (Dr Evette Cristal). Stage IA cholangiocarcinoma    Family History  Problem Relation Age of Onset   Hypertension Mother    Prostate cancer Father 48        deceased   Hypertension Sister    Asthma Sister    Hypertension Brother    Cancer Half-Sister  unknown, dx 45   Esophageal cancer Neg Hx    Colon polyps Neg Hx    Colon cancer Neg Hx    Stomach cancer Neg Hx    Rectal cancer Neg Hx    Inflammatory bowel disease Neg Hx    Liver disease Neg Hx    Pancreatic cancer Neg Hx     Social History:  reports that he has been smoking cigarettes. He has a 10.00 pack-year smoking history. He has never used smokeless tobacco. He reports current alcohol use of about 21.0 standard drinks of alcohol per week. He reports that he does not use drugs.The patient is alone today.  Allergies:  Allergies  Allergen Reactions   Pneumococcal Vaccines Swelling    Current Medications: Current Outpatient Medications  Medication Sig Dispense Refill   hyoscyamine (LEVSIN SL) 0.125 MG SL tablet SMARTSIG:1-2 Tablet(s) Sublingual Every 4-6 Hours PRN     methocarbamol (ROBAXIN) 750 MG tablet Take 750 mg by mouth every 6 (six) hours as needed.     albuterol (PROVENTIL HFA;VENTOLIN HFA) 108 (90 BASE) MCG/ACT inhaler Inhale 2 puffs into the lungs every 6 (six) hours as needed for wheezing or shortness of breath.     allopurinol (ZYLOPRIM) 300 MG tablet Take 300 mg by mouth daily.     Calcium Carbonate (CALCIUM 500 PO) Take 1 tablet by mouth 3 (three) times daily.     Cholecalciferol (VITAMIN D) 50 MCG (2000 UT) tablet Take 2,000 Units by mouth daily.     colchicine 0.6 MG tablet Take 1 tablet every day by oral route as directed for 3 days.     cyclobenzaprine (FLEXERIL) 5 MG tablet Take 5 mg by mouth at bedtime as needed for muscle spasms. Patient reports with neck pain only     diclofenac Sodium (VOLTAREN) 1 % GEL Apply 2 g topically at bedtime.     docusate sodium (COLACE) 100 MG capsule Take 200 mg by mouth daily.     fluticasone (FLONASE) 50 MCG/ACT nasal spray Place 2 sprays into both nostrils daily.     HYDROcodone-acetaminophen (NORCO/VICODIN) 5-325  MG tablet Take 1 tablet by mouth 4 (four) times daily as needed.     irbesartan-hydrochlorothiazide (AVALIDE) 150-12.5 MG tablet Take 1 tablet by mouth daily.     omeprazole (PRILOSEC) 40 MG capsule Take 1 capsule (40 mg total) by mouth daily. 60 capsule 4   polyethylene glycol (MIRALAX) 17 g packet      SPRYCEL 100 MG tablet TAKE 1 TABLET BY MOUTH DAILY 30 tablet 5   tamsulosin (FLOMAX) 0.4 MG CAPS capsule Take 0.8 mg by mouth daily.     TRELEGY ELLIPTA 100-62.5-25 MCG/INH AEPB Inhale 1 puff into the lungs daily at 6 (six) AM.     No current facility-administered medications for this visit.

## 2022-10-03 NOTE — Assessment & Plan Note (Signed)
He has recurrent hypocalcemia.  He has only been taking his calcium 600 mg twice a day.  I recommended that he increase this to 3 times daily again.  He continues vitamin D 2000 international unit units daily. Vitamin D was normal in June.  He knows to continue calcium as recommended.

## 2022-10-03 NOTE — Assessment & Plan Note (Signed)
Recent CT imaging revealed stable mild dilatation of the ascending thoracic aorta measuring 4.1 cm. Recommend annual imaging followup by CTA or MRA.  We will plan to follow this with CTA chest in 1 year.

## 2022-10-03 NOTE — Assessment & Plan Note (Signed)
CT abdomen and pelvis in June did not reveal any evidence of recurrence.  CEA and CA 19-9 remain normal.

## 2022-10-03 NOTE — Assessment & Plan Note (Signed)
Stage I adenocarcinoma of the left upper lobe of the lung.  He was treated with lobectomy in February 2016.  CT imaging in September did not reveal any evidence of recurrence.

## 2022-10-03 NOTE — Assessment & Plan Note (Signed)
CML originally diagnosed in 2016.  PCR for BCR/ABL remains negative.  His disease remains in complete molecular remission with dasatinib 100 mg daily.  He knows to continue dasatinib daily.  We will plan to see him back in 3 months with a CBC, comprehensive metabolic panel and PCR for BCR/ABL.

## 2022-10-10 NOTE — Progress Notes (Signed)
10/11/2022 Kyle Keens Sr. [id number] [birth year]  Referring provider: Eber Jones, NP Primary GI doctor: Dr. Chales Abrahams  ASSESSMENT AND PLAN:   Dyspepsia with history of cholangiocarcinoma s/p whipple 2014, gastric ulcer 2022, improved on EGD 2023 CT chest AB and pelvis 06/2022 unremarkable ETOH 4-5 beers a day, nocturnal dyspepsia Will schedule EGD with colonoscopy Will increase prilosec to BID, elevated head of bed and stop ETOH If negative EGD, possible muscular, ? Gabapentin/lyrica trial   Personal history of colonic polyps Worsening constipation and AB pain Will schedule colonoscopy sooner with EGD Add on miralax and 2 days prep  COPD with asthma (HCC) Not on oxygen, not smoking  Anemia in chronic illness Gets labs with cancer center  Drug-induced constipation Will need 2 day prep Get on miralax daily, if not helpful can consider linzess.   I recommend upper gastrointestinal and colorectal evaluation with an EGD and colonoscopy.  Risk of bowel prep, conscious sedation, and EGD and colonoscopy were discussed.  Risks include but are not limited to dehydration, pain, bleeding, cardiopulmonary process, bowel perforation, or other possible adverse outcomes..  Treatment plan was discussed with patient, and agreed upon.  Patient Care Team: Lars Mage, NP as PCP - General (Nurse Practitioner) Dellia Beckwith, MD as Consulting Physician (Oncology) Lupita Leash, MD as Consulting Physician (Pulmonary Disease) Lynann Bologna, MD as Consulting Physician (Gastroenterology) Mansouraty, Netty Starring., MD as Consulting Physician (Gastroenterology)  HISTORY OF PRESENT ILLNESS: 72 y.o. male with a past medical history of multiple advanced colon polyps, gastric ulcers, history of cholangiocarcinoma stage 1A status post Whipple at [facility] 2014, CML in remission, history of stage Ia non-small cell lung cancer status post left upper lobe resection 2016, chronic back  pain requiring narcotics, COPD with smoking, CAD, hypertension, hyperlipidemia, B12 deficiency and others listed below presents for evaluation of abdominal pain.  01/02/2022 colonoscopy and upper endoscopy   2-day prep showed 2 polyps 4 to 6 mm in size proximal transverse and mid transverse, recurrent descending colon polyp status post piecemeal polypectomy, 2 polyps 4 to 6 mm mid sigmoid and distal sigmoid, nonbleeding internal hemorrhoids repeat 1 year EGD showed complete healing of marginal ulcers, patient pylorus sparing Whipple characterized by healthy-appearing mucosa in the duodenum, no specimens collected.  Following with hematology/oncology for [facility] and previous cancers, elevated LFTs admits to drinking 1 pint of bourbon daily.  Down to 3 beers daily. 07/05/2022 no evidence of recurrence of disease on CT chest abdomen pelvis with contrast  For last 6+ months has been having epigastric pain that is waking him up at night around 3 AM, will have to take hydrocodone to help.  No radiation to his back, just a dull ache and his stomach will make a lot of noises.  Worse in the evening with sitting down and in the morning, not as bad during the day.  Not worse after eating. Elevating or getting up into a chair at night helps.  He has been on the prilosec once daily, denies GERD, dysphagia.  Denies nausea, vomiting. Denies fever, chills. Denies burping, bloating.  Has had some decreased appetite with stomach pain, states has lost about 5 lbs but weight will fluctuate. No swelling in legs or AB.  Denies melena or hematochezia, can be dark with pepto.  Has BM daily, hard stools, not complete BM, miralax can help but takes as needed. Denies NSAIDS.  He drinks 4-5 beers a day for years, denies tobacco or drug use.  Wt Readings from Last 3 Encounters:  10/11/22 220 lb (99.8 kg)  10/03/22 216 lb 6.4 oz (98.2 kg)  06/27/22 221 lb 8 oz (100.5 kg)   Previous GI work-up: Colon 04/30/2021 Dr.  Meridee Score - Hemorrhoids found on digital rectal exam. - There was significant looping of the colon. - Stool in the entire examined colon - lavaged with adequate visualization. - 13, 2 to 10 mm polyps in the sigmoid colon, in the descending colon, in the transverse colon, in the ascending colon and in the cecum, removed with a cold snare. Resected and retrieved. Bx- TAs - One 35 mm polyp in the descending colon, removed with mucosal resection. Resected and retrieved. Treated with a hot snare. Clips (MR conditional) were placed. Bx- TAs - Normal mucosa in the entire examined colon. - Non-bleeding non-thrombosed external and internal hemorrhoids.   EGD 04/30/2021 Dr. Meridee Score - No gross lesions in esophagus. Tortuous esophagus. - Gastritis. Biopsied. - Patent duodenoenterostomy, characterized by healthy appearing mucosa was found in the bulb. - Non-bleeding jejunal ulcers with a clean ulcer base (Forrest Class III) in presumed efferent limb (though unclear as unable to visualize choledochoduodenostomy/choledochojejunostomy).. - Normal mucosa was found in the afferent and efferent jejunum otherwise. Biopsied.   Colon 01/25/2021 -Multiple colonic polyps (22) s/p polypectomy. Bx- TAs -One 20 mm flat sessile polyp in the proximal descending colon. Biopsied. Tattooed. -Mild pancolonic diverticulosis -Non-bleeding internal hemorrhoids. -The examination was otherwise normal on direct and retroflexion views.  He  reports that he has been smoking cigarettes. He has a 10.00 pack-year smoking history. He has never used smokeless tobacco. He reports current alcohol use of about 21.0 standard drinks of alcohol per week. He reports that he does not use drugs.  Current Medications:    Current Outpatient Medications (Cardiovascular):    irbesartan-hydrochlorothiazide (AVALIDE) 150-12.5 MG tablet, Take 1 tablet by mouth daily.  Current Outpatient Medications (Respiratory):    albuterol (PROVENTIL  HFA;VENTOLIN HFA) 108 (90 BASE) MCG/ACT inhaler, Inhale 2 puffs into the lungs every 6 (six) hours as needed for wheezing or shortness of breath.   fluticasone (FLONASE) 50 MCG/ACT nasal spray, Place 2 sprays into both nostrils daily.   TRELEGY ELLIPTA 100-62.5-25 MCG/INH AEPB, Inhale 1 puff into the lungs daily at 6 (six) AM.  Current Outpatient Medications (Analgesics):    allopurinol (ZYLOPRIM) 300 MG tablet, Take 300 mg by mouth daily.   colchicine 0.6 MG tablet, Take 1 tablet every day by oral route as directed for 3 days.   HYDROcodone-acetaminophen (NORCO/VICODIN) 5-325 MG tablet, Take 1 tablet by mouth 4 (four) times daily as needed.   Current Outpatient Medications (Other):    Calcium Carbonate (CALCIUM 500 PO), Take 1 tablet by mouth 3 (three) times daily.   Cholecalciferol (VITAMIN D) 50 MCG (2000 UT) tablet, Take 2,000 Units by mouth daily.   cyclobenzaprine (FLEXERIL) 5 MG tablet, Take 5 mg by mouth at bedtime as needed for muscle spasms. Patient reports with neck pain only   diclofenac Sodium (VOLTAREN) 1 % GEL, Apply 2 g topically at bedtime.   docusate sodium (COLACE) 100 MG capsule, Take 200 mg by mouth daily.   hyoscyamine (LEVSIN SL) 0.125 MG SL tablet, SMARTSIG:1-2 Tablet(s) Sublingual Every 4-6 Hours PRN   methocarbamol (ROBAXIN) 750 MG tablet, Take 750 mg by mouth every 6 (six) hours as needed.   omeprazole (PRILOSEC) 40 MG capsule, Take 1 capsule (40 mg total) by mouth 2 (two) times daily before a meal.   polyethylene glycol (MIRALAX) 17  g packet,    SPRYCEL 100 MG tablet, TAKE 1 TABLET BY MOUTH DAILY   tamsulosin (FLOMAX) 0.4 MG CAPS capsule, Take 0.8 mg by mouth daily.  Medical History:  Past Medical History:  Diagnosis Date   Abnormal transaminases 02/27/2022   Arthritis    on PRN meds   Asthma    uses inhaler   BPH (benign prostatic hyperplasia)    CAD (coronary artery disease)    Cataract    bilateral sx   Cholangiocarcinoma of biliary tract (HCC)     Chronic myelogenous leukemia (HCC)    COPD (chronic obstructive pulmonary disease) (HCC)    uses inhaler   DJD (degenerative joint disease)    Dyslipidemia    Family history of colonic polyps    Family history of prostate cancer    Gout    HTN (hypertension)    on meds   Hypercholesterolemia    Lung cancer (HCC)    Myelogenous leukemia (HCC)    OA (osteoarthritis)    PAD (peripheral artery disease) (HCC)    Personal history of colonic polyps    Renal insufficiency    Tendonitis    patellar   Thoracic aortic aneurysm (HCC) 06/27/2022   Allergies:  Allergies  Allergen Reactions   Pneumococcal Vaccines Swelling     Surgical History:  He  has a past surgical history that includes Lung cancer surgery (Jan. 2016); Bowel duct surg (2014); wipple procedure; Cardiac catheterization (2000); Colonoscopy (06/23/2015); Shoulder open rotator cuff repair (Right, 09/27/2020); Cholecystectomy; Colonoscopy with propofol (N/A, 04/30/2021); Endoscopic mucosal resection (N/A, 04/30/2021); Esophagogastroduodenoscopy (egd) with propofol (N/A, 04/30/2021); biopsy (04/30/2021); polypectomy (04/30/2021); Submucosal lifting injection (04/30/2021); and Hemostasis clip placement (04/30/2021). Family History:  His family history includes Asthma in his sister; Cancer in his half-sister; Hypertension in his brother, mother, and sister; Prostate cancer (age of onset: 39) in his father.  REVIEW OF SYSTEMS  : All other systems reviewed and negative except where noted in the History of Present Illness.  PHYSICAL EXAM: BP 138/70   Pulse 74   Ht 5\' 9"  (1.753 m)   Wt 220 lb (99.8 kg)   BMI 32.49 kg/m  General:   Pleasant, well developed male in no acute distress Head:   Normocephalic and atraumatic. Eyes:  sclerae anicteric,conjunctive pink  Heart:   regular rate and rhythm Pulm:  Clear anteriorly; no wheezing Abdomen:   Soft, Obese AB, Active bowel sounds. No tenderness , No organomegaly appreciated. Rectal: Not  evaluated Extremities:  Without edema. Msk: Symmetrical without gross deformities. Peripheral pulses intact.  Neurologic:  Alert and  oriented x4;  No focal deficits.  Skin:   Dry and intact without significant lesions or rashes. Psychiatric:  Cooperative. Normal mood and affect.  RELEVANT LABS AND IMAGING: CBC    Component Value Date/Time   WBC 5.9 09/20/2022 0920   WBC 4.2 09/25/2020 0917   RBC 3.95 (L) 09/20/2022 0920   HGB 13.4 09/20/2022 0920   HCT 38.1 (L) 09/20/2022 0920   PLT 215 09/20/2022 0920   MCV 96.5 09/20/2022 0920   MCV 98 (A) 06/27/2022 0000   MCH 33.9 09/20/2022 0920   MCHC 35.2 09/20/2022 0920   RDW 14.3 09/20/2022 0920   LYMPHSABS 1.1 09/20/2022 0920   MONOABS 1.0 09/20/2022 0920   EOSABS 0.1 09/20/2022 0920   BASOSABS 0.1 09/20/2022 0920    CMP     Component Value Date/Time   NA 132 (L) 09/20/2022 0920   NA 132 (A) 06/27/2022 0000  K 3.6 09/20/2022 0920   CL 100 09/20/2022 0920   CO2 22 09/20/2022 0920   GLUCOSE 86 09/20/2022 0920   BUN 7 (L) 09/20/2022 0920   BUN 7 06/27/2022 0000   CREATININE 0.75 09/20/2022 0920   CALCIUM 8.2 (L) 09/20/2022 0920   CALCIUM 8.7 08/27/2021 1526   PROT 6.5 09/20/2022 0920   ALBUMIN 3.2 (L) 09/20/2022 0920   AST 44 (H) 09/20/2022 0920   ALT 29 09/20/2022 0920   ALKPHOS 58 09/20/2022 0920   BILITOT 0.7 09/20/2022 0920   GFRNONAA >60 09/20/2022 0920   GFRAA >90 04/27/2012 2307     Doree Albee, PA-C 9:22 AM

## 2022-10-11 ENCOUNTER — Encounter: Payer: Self-pay | Admitting: Physician Assistant

## 2022-10-11 ENCOUNTER — Ambulatory Visit (INDEPENDENT_AMBULATORY_CARE_PROVIDER_SITE_OTHER): Payer: BC Managed Care – PPO | Admitting: Physician Assistant

## 2022-10-11 VITALS — BP 138/70 | HR 74 | Ht 69.0 in | Wt 220.0 lb

## 2022-10-11 DIAGNOSIS — Z8509 Personal history of malignant neoplasm of other digestive organs: Secondary | ICD-10-CM

## 2022-10-11 DIAGNOSIS — D638 Anemia in other chronic diseases classified elsewhere: Secondary | ICD-10-CM | POA: Diagnosis not present

## 2022-10-11 DIAGNOSIS — R1013 Epigastric pain: Secondary | ICD-10-CM | POA: Diagnosis not present

## 2022-10-11 DIAGNOSIS — Z86010 Personal history of colonic polyps: Secondary | ICD-10-CM

## 2022-10-11 DIAGNOSIS — Z8711 Personal history of peptic ulcer disease: Secondary | ICD-10-CM | POA: Diagnosis not present

## 2022-10-11 DIAGNOSIS — J4489 Other specified chronic obstructive pulmonary disease: Secondary | ICD-10-CM

## 2022-10-11 DIAGNOSIS — K5903 Drug induced constipation: Secondary | ICD-10-CM

## 2022-10-11 MED ORDER — OMEPRAZOLE 40 MG PO CPDR: 40.0000 mg | DELAYED_RELEASE_CAPSULE | Freq: Two times a day (BID) | ORAL | 0 refills | Status: DC

## 2022-10-11 MED ORDER — CLENPIQ 10-3.5-12 MG-GM -GM/160ML PO SOLN: 1.0000 | ORAL | 0 refills | Status: DC

## 2022-10-11 NOTE — Patient Instructions (Signed)
Please take your proton pump inhibitor medication, omeprazole to twice a day  Please take this medication 30 minutes to 1 hour before meals- this makes it more effective.  Avoid spicy and acidic foods Avoid fatty foods Limit your intake of coffee, tea, alcohol, and carbonated drinks- CUT BACK ON ALCOHOL Work to maintain a healthy weight Keep the head of the bed elevated at least 3 inches with blocks or a wedge pillow if you are having any nighttime symptoms Stay upright for 2 hours after eating Avoid meals and snacks three to four hours before bedtime  Miralax is an osmotic laxative.  It only brings more water into the stool.  This is safe to take daily.  Can take up to 17 gram of miralax twice a day.  Mix with juice or coffee.  Start 1 capful at night for 3-4 days and reassess your response in 3-4 days.  You can increase and decrease the dose based on your response.  Remember, it can take up to 3-4 days to take effect OR for the effects to wear off.   I often pair this with benefiber in the morning to help assure the stool is not too loose.

## 2022-10-13 NOTE — Progress Notes (Signed)
Agree with assessment/plan.  Raj Angela Vazguez, MD [facility] [phone]  

## 2022-11-01 DIAGNOSIS — J301 Allergic rhinitis due to pollen: Secondary | ICD-10-CM | POA: Diagnosis not present

## 2022-11-01 DIAGNOSIS — I729 Aneurysm of unspecified site: Secondary | ICD-10-CM | POA: Diagnosis not present

## 2022-11-01 DIAGNOSIS — J449 Chronic obstructive pulmonary disease, unspecified: Secondary | ICD-10-CM | POA: Diagnosis not present

## 2022-11-01 DIAGNOSIS — G2581 Restless legs syndrome: Secondary | ICD-10-CM | POA: Diagnosis not present

## 2022-11-08 DIAGNOSIS — M150 Primary generalized (osteo)arthritis: Secondary | ICD-10-CM | POA: Diagnosis not present

## 2022-11-08 DIAGNOSIS — M47812 Spondylosis without myelopathy or radiculopathy, cervical region: Secondary | ICD-10-CM | POA: Diagnosis not present

## 2022-11-08 DIAGNOSIS — M47816 Spondylosis without myelopathy or radiculopathy, lumbar region: Secondary | ICD-10-CM | POA: Diagnosis not present

## 2022-11-08 DIAGNOSIS — M545 Low back pain, unspecified: Secondary | ICD-10-CM | POA: Diagnosis not present

## 2022-11-19 ENCOUNTER — Encounter: Payer: Self-pay | Admitting: Gastroenterology

## 2022-11-25 ENCOUNTER — Other Ambulatory Visit: Payer: Self-pay | Admitting: Oncology

## 2022-11-26 ENCOUNTER — Ambulatory Visit (AMBULATORY_SURGERY_CENTER): Payer: BC Managed Care – PPO | Admitting: Gastroenterology

## 2022-11-26 ENCOUNTER — Encounter: Payer: Self-pay | Admitting: Gastroenterology

## 2022-11-26 VITALS — BP 117/76 | HR 72 | Temp 99.6°F | Resp 17 | Ht 69.0 in | Wt 220.0 lb

## 2022-11-26 DIAGNOSIS — D124 Benign neoplasm of descending colon: Secondary | ICD-10-CM

## 2022-11-26 DIAGNOSIS — Z09 Encounter for follow-up examination after completed treatment for conditions other than malignant neoplasm: Secondary | ICD-10-CM | POA: Diagnosis not present

## 2022-11-26 DIAGNOSIS — K2940 Chronic atrophic gastritis without bleeding: Secondary | ICD-10-CM | POA: Diagnosis not present

## 2022-11-26 DIAGNOSIS — R1013 Epigastric pain: Secondary | ICD-10-CM

## 2022-11-26 DIAGNOSIS — K2970 Gastritis, unspecified, without bleeding: Secondary | ICD-10-CM

## 2022-11-26 DIAGNOSIS — Z86010 Personal history of colonic polyps: Secondary | ICD-10-CM | POA: Diagnosis not present

## 2022-11-26 DIAGNOSIS — K319 Disease of stomach and duodenum, unspecified: Secondary | ICD-10-CM | POA: Diagnosis not present

## 2022-11-26 MED ORDER — SODIUM CHLORIDE 0.9 % IV SOLN: 500.0000 mL | Freq: Once | INTRAVENOUS | Status: DC

## 2022-11-26 NOTE — Progress Notes (Signed)
10/11/2022 Kyle Hong Sr. [id number] March 07, 1951   Referring provider: Barnetta Chapel, NP Primary GI doctor: Dr. Lyndel Montoya   ASSESSMENT AND PLAN:    Dyspepsia with history of cholangiocarcinoma s/p whipple 2014, gastric ulcer 2022, improved on EGD 2023 CT chest AB and pelvis 06/2022 unremarkable ETOH 4-5 beers a day, nocturnal dyspepsia Will schedule EGD with colonoscopy Will increase prilosec to BID, elevated head of bed and stop ETOH If negative EGD, possible muscular, ? Gabapentin/lyrica trial    Personal history of colonic polyps Worsening constipation and AB pain Will schedule colonoscopy sooner with EGD Add on miralax and 2 days prep   COPD with asthma ([place]) Not on oxygen, not smoking   Anemia in chronic illness Gets labs with cancer center   Drug-induced constipation Will need 2 day prep Get on miralax daily, if not helpful can consider linzess.    I recommend upper gastrointestinal and colorectal evaluation with an EGD and colonoscopy.  Risk of bowel prep, conscious sedation, and EGD and colonoscopy were discussed.  Risks include but are not limited to dehydration, pain, bleeding, cardiopulmonary process, bowel perforation, or other possible adverse outcomes..  Treatment plan was discussed with patient, and agreed upon.   Patient Care Team: Kyle Dillon, NP as PCP - General (Nurse Practitioner) Kyle Kaplan, MD as Consulting Physician (Oncology) Kyle Doom, MD as Consulting Physician (Pulmonary Disease) Kyle Denmark, MD as Consulting Physician (Gastroenterology) Kyle, Telford Nab., MD as Consulting Physician (Gastroenterology)   HISTORY OF PRESENT ILLNESS: 72 y.o. male with a past medical history of multiple advanced colon polyps, gastric ulcers, history of cholangiocarcinoma stage 1A status post Whipple at [facility] 2014, CML in remission, history of stage Ia non-small cell lung cancer status post left upper lobe resection 2016, chronic  back pain requiring narcotics, COPD with smoking, CAD, hypertension, hyperlipidemia, B12 deficiency and others listed below presents for evaluation of abdominal pain.  01/02/2022 colonoscopy and upper endoscopy   2-day prep showed 2 polyps 4 to 6 mm in size proximal transverse and mid transverse, recurrent descending colon polyp status post piecemeal polypectomy, 2 polyps 4 to 6 mm mid sigmoid and distal sigmoid, nonbleeding internal hemorrhoids repeat 1 year EGD showed complete healing of marginal ulcers, patient pylorus sparing Whipple characterized by healthy-appearing mucosa in the duodenum, no specimens collected.   Following with hematology/oncology for [facility] and previous cancers, elevated LFTs admits to drinking 1 pint of bourbon daily.  Down to 3 beers daily. 07/05/2022 no evidence of recurrence of disease on CT chest abdomen pelvis with contrast   For last 6+ months has been having epigastric pain that is waking him up at night around 3 AM, will have to take hydrocodone to help.  No radiation to his back, just a dull ache and his stomach will make a lot of noises.  Worse in the evening with sitting down and in the morning, not as bad during the day.  Not worse after eating. Elevating or getting up into a chair at night helps.  He has been on the prilosec once daily, denies GERD, dysphagia.  Denies nausea, vomiting. Denies fever, chills. Denies burping, bloating.  Has had some decreased appetite with stomach pain, states has lost about 5 lbs but weight will fluctuate. No swelling in legs or AB.  Denies melena or hematochezia, can be dark with pepto.  Has BM daily, hard stools, not complete BM, miralax can help but takes as needed. Denies NSAIDS.  He drinks 4-5 beers  a day for years, denies tobacco or drug use.        Wt Readings from Last 3 Encounters:  10/11/22 220 lb (99.8 kg)  10/03/22 216 lb 6.4 oz (98.2 kg)  06/27/22 221 lb 8 oz (100.5 kg)    Previous GI work-up: Colon 04/30/2021  Dr. Rush Montoya - Hemorrhoids found on digital rectal exam. - There was significant looping of the colon. - Stool in the entire examined colon - lavaged with adequate visualization. - 13, 2 to 10 mm polyps in the sigmoid colon, in the descending colon, in the transverse colon, in the ascending colon and in the cecum, removed with a cold snare. Resected and retrieved. Bx- TAs - One 35 mm polyp in the descending colon, removed with mucosal resection. Resected and retrieved. Treated with a hot snare. Clips (MR conditional) were placed. Bx- TAs - Normal mucosa in the entire examined colon. - Non-bleeding non-thrombosed external and internal hemorrhoids.   EGD 04/30/2021 Dr. Rush Montoya - No gross lesions in esophagus. Tortuous esophagus. - Gastritis. Biopsied. - Patent duodenoenterostomy, characterized by healthy appearing mucosa was found in the bulb. - Non-bleeding jejunal ulcers with a clean ulcer base (Forrest Class III) in presumed efferent limb (though unclear as unable to visualize choledochoduodenostomy/choledochojejunostomy).. - Normal mucosa was found in the afferent and efferent jejunum otherwise. Biopsied.   Colon 01/25/2021 -Multiple colonic polyps (22) s/p polypectomy. Bx- TAs -One 20 mm flat sessile polyp in the proximal descending colon. Biopsied. Tattooed. -Mild pancolonic diverticulosis -Non-bleeding internal hemorrhoids. -The examination was otherwise normal on direct and retroflexion views.   He  reports that he has been smoking cigarettes. He has a 10.00 pack-year smoking history. He has never used smokeless tobacco. He reports current alcohol use of about 21.0 standard drinks of alcohol per week. He reports that he does not use drugs.   Current Medications:      Current Outpatient Medications (Cardiovascular):    irbesartan-hydrochlorothiazide (AVALIDE) 150-12.5 MG tablet, Take 1 tablet by mouth daily.   Current Outpatient Medications (Respiratory):    albuterol  (PROVENTIL HFA;VENTOLIN HFA) 108 (90 BASE) MCG/ACT inhaler, Inhale 2 puffs into the lungs every 6 (six) hours as needed for wheezing or shortness of breath.   fluticasone (FLONASE) 50 MCG/ACT nasal spray, Place 2 sprays into both nostrils daily.   TRELEGY ELLIPTA 100-62.5-25 MCG/INH AEPB, Inhale 1 puff into the lungs daily at 6 (six) AM.   Current Outpatient Medications (Analgesics):    allopurinol (ZYLOPRIM) 300 MG tablet, Take 300 mg by mouth daily.   colchicine 0.6 MG tablet, Take 1 tablet every day by oral route as directed for 3 days.   HYDROcodone-acetaminophen (NORCO/VICODIN) 5-325 MG tablet, Take 1 tablet by mouth 4 (four) times daily as needed.     Current Outpatient Medications (Other):    Calcium Carbonate (CALCIUM 500 PO), Take 1 tablet by mouth 3 (three) times daily.   Cholecalciferol (VITAMIN D) 50 MCG (2000 UT) tablet, Take 2,000 Units by mouth daily.   cyclobenzaprine (FLEXERIL) 5 MG tablet, Take 5 mg by mouth at bedtime as needed for muscle spasms. Patient reports with neck pain only   diclofenac Sodium (VOLTAREN) 1 % GEL, Apply 2 g topically at bedtime.   docusate sodium (COLACE) 100 MG capsule, Take 200 mg by mouth daily.   hyoscyamine (LEVSIN SL) 0.125 MG SL tablet, SMARTSIG:1-2 Tablet(s) Sublingual Every 4-6 Hours PRN   methocarbamol (ROBAXIN) 750 MG tablet, Take 750 mg by mouth every 6 (six) hours as needed.   omeprazole (  PRILOSEC) 40 MG capsule, Take 1 capsule (40 mg total) by mouth 2 (two) times daily before a meal.   polyethylene glycol (MIRALAX) 17 g packet,    SPRYCEL 100 MG tablet, TAKE 1 TABLET BY MOUTH DAILY   tamsulosin (FLOMAX) 0.4 MG CAPS capsule, Take 0.8 mg by mouth daily.   Medical History:      Past Medical History:  Diagnosis Date   Abnormal transaminases 02/27/2022   Arthritis      on PRN meds   Asthma      uses inhaler   BPH (benign prostatic hyperplasia)     CAD (coronary artery disease)     Cataract      bilateral sx   Cholangiocarcinoma of  biliary tract (HCC)     Chronic myelogenous leukemia (HCC)     COPD (chronic obstructive pulmonary disease) (HCC)      uses inhaler   DJD (degenerative joint disease)     Dyslipidemia     Family history of colonic polyps     Family history of prostate cancer     Gout     HTN (hypertension)      on meds   Hypercholesterolemia     Lung cancer (HCC)     Myelogenous leukemia ([place])     OA (osteoarthritis)     PAD (peripheral artery disease) (HCC)     Personal history of colonic polyps     Renal insufficiency     Tendonitis      patellar   Thoracic aortic aneurysm ([place]) 06/27/2022    Allergies:      Allergies  Allergen Reactions   Pneumococcal Vaccines Swelling      Surgical History:  He  has a past surgical history that includes Lung cancer surgery (Jan. 2016); Bowel duct surg (2014); wipple procedure; Cardiac catheterization (2000); Colonoscopy (06/23/2015); Shoulder open rotator cuff repair (Right, 09/27/2020); Cholecystectomy; Colonoscopy with propofol (N/A, 04/30/2021); Endoscopic mucosal resection (N/A, 04/30/2021); Esophagogastroduodenoscopy (egd) with propofol (N/A, 04/30/2021); biopsy (04/30/2021); polypectomy (04/30/2021); Submucosal lifting injection (04/30/2021); and Hemostasis clip placement (04/30/2021). Family History:  His family history includes Asthma in his sister; Cancer in his half-sister; Hypertension in his brother, mother, and sister; Prostate cancer (age of onset: 57) in his father.   REVIEW OF SYSTEMS  : All other systems reviewed and negative except where noted in the History of Present Illness.   PHYSICAL EXAM: BP 138/70   Pulse 74   Ht '5\' 9"'$  (1.753 m)   Wt 220 lb (99.8 kg)   BMI 32.49 kg/m  General:   Pleasant, well developed male in no acute distress Head:   Normocephalic and atraumatic. Eyes:  sclerae anicteric,conjunctive pink  Heart:   regular rate and rhythm Pulm:  Clear anteriorly; no wheezing Abdomen:   Soft, Obese AB, Active bowel sounds. No tenderness ,  No organomegaly appreciated. Rectal: Not evaluated Extremities:  Without edema. Msk: Symmetrical without gross deformities. Peripheral pulses intact.  Neurologic:  Alert and  oriented x4;  No focal deficits.  Skin:   Dry and intact without significant lesions or rashes. Psychiatric:  Cooperative. Normal mood and affect.   RELEVANT LABS AND IMAGING: CBC Labs (Brief)          Component Value Date/Time    WBC 5.9 09/20/2022 0920    WBC 4.2 09/25/2020 0917    RBC 3.95 (L) 09/20/2022 0920    HGB 13.4 09/20/2022 0920    HCT 38.1 (L) 09/20/2022 0920    PLT 215 09/20/2022 0920  MCV 96.5 09/20/2022 0920    MCV 98 (A) 06/27/2022 0000    MCH 33.9 09/20/2022 0920    MCHC 35.2 09/20/2022 0920    RDW 14.3 09/20/2022 0920    LYMPHSABS 1.1 09/20/2022 0920    MONOABS 1.0 09/20/2022 0920    EOSABS 0.1 09/20/2022 0920    BASOSABS 0.1 09/20/2022 0920        CMP     Labs (Brief)          Component Value Date/Time    NA 132 (L) 09/20/2022 0920    NA 132 (A) 06/27/2022 0000    K 3.6 09/20/2022 0920    CL 100 09/20/2022 0920    CO2 22 09/20/2022 0920    GLUCOSE 86 09/20/2022 0920    BUN 7 (L) 09/20/2022 0920    BUN 7 06/27/2022 0000    CREATININE 0.75 09/20/2022 0920    CALCIUM 8.2 (L) 09/20/2022 0920    CALCIUM 8.7 08/27/2021 1526    PROT 6.5 09/20/2022 0920    ALBUMIN 3.2 (L) 09/20/2022 0920    AST 44 (H) 09/20/2022 0920    ALT 29 09/20/2022 0920    ALKPHOS 58 09/20/2022 0920    BILITOT 0.7 09/20/2022 0920    GFRNONAA >60 09/20/2022 0920    GFRAA >90 04/27/2012 2307          Vladimir Crofts, PA-C 9:22 AM     Attending physician's note   I have taken history, reviewed the chart and examined the patient. I performed a substantive portion of this encounter, including complete performance of at least one of the key components, in conjunction with the APP. I agree with the Advanced Practitioner's note, impression and recommendations.   For EGD/colon  Carmell Austria,  MD [facility] [phone]

## 2022-11-26 NOTE — Patient Instructions (Signed)
You may resume your regular medications today.  Read all of the handouts given to you by your recovery room nurse.  Resume all of your normal medicines today.TYOU HAD AN ENDOSCOPIC PROCEDURE TODAY AT [facility]:   Refer to the procedure report that was given to you for any specific questions about what was found during the examination.  If the procedure report does not answer your questions, please call your gastroenterologist to clarify.  If you requested that your care partner not be given the details of your procedure findings, then the procedure report has been included in a sealed envelope for you to review at your convenience later.  YOU SHOULD EXPECT: Some feelings of bloating in the abdomen. Passage of more gas than usual.  Walking can help get rid of the air that was put into your GI tract during the procedure and reduce the bloating. If you had a lower endoscopy (such as a colonoscopy or flexible sigmoidoscopy) you may notice spotting of blood in your stool or on the toilet paper. If you underwent a bowel prep for your procedure, you may not have a normal bowel movement for a few days.  Please Note:  You might notice some irritation and congestion in your nose or some drainage.  This is from the oxygen used during your procedure.  There is no need for concern and it should clear up in a day or so.  SYMPTOMS TO REPORT IMMEDIATELY:  Following lower endoscopy (colonoscopy or flexible sigmoidoscopy):  Excessive amounts of blood in the stool  Significant tenderness or worsening of abdominal pains  Swelling of the abdomen that is new, acute  Fever of 100F or higher  Following upper endoscopy (EGD)  Vomiting of blood or coffee ground material  New chest pain or pain under the shoulder blades  Painful or persistently difficult swallowing  New shortness of breath  Fever of 100F or higher  Black, tarry-looking stools  For urgent or emergent issues, a gastroenterologist can  be reached at any hour by calling [phone]. Do not use MyChart messaging for urgent concerns.    DIET:  We do recommend a small meal at first, but then you may proceed to your regular diet.  Drink plenty of fluids but you should avoid alcoholic beverages for 24 hours.  Try to eat a high fiber diet, and drink plenty of water.  ACTIVITY:  You should plan to take it easy for the rest of today and you should NOT DRIVE or use heavy machinery until tomorrow (because of the sedation medicines used during the test).    FOLLOW UP: Our staff will call the number listed on your records the next business day following your procedure.  We will call around 7:15- 8:00 am to check on you and address any questions or concerns that you may have regarding the information given to you following your procedure. If we do not reach you, we will leave a message.     If any biopsies were taken you will be contacted by phone or by letter within the next 1-3 weeks.  Please call us at [phone] if you have not heard about the biopsies in 3 weeks.    SIGNATURES/CONFIDENTIALITY: You and/or your care partner have signed paperwork which will be entered into your electronic medical record.  These signatures attest to the fact that that the information above on your After Visit Summary has been reviewed and is understood.  Full responsibility of the confidentiality  of this discharge information lies with you and/or your care-partner.

## 2022-11-26 NOTE — Progress Notes (Signed)
Called to room to assist during endoscopic procedure.  Patient ID and intended procedure confirmed with present staff. Received instructions for my participation in the procedure from the performing physician.  

## 2022-11-26 NOTE — Progress Notes (Signed)
Sedate, gd SR, tolerated procedure well, VSS, report to RN 

## 2022-11-26 NOTE — Op Note (Signed)
[place] Patient Name: Kyle Montoya Procedure Date: 11/26/2022 3:04 PM MRN: [id number] Endoscopist: [name] , MD, [id number] Age: 72 Referring MD:  Date of Birth: 1951/09/17 Gender: Male Account #: [id number] Procedure:                Colonoscopy Indications:              High risk colon cancer surveillance: Personal                            history of Large descending colon polyp s/p EMR                            04/2021. Previous colonoscopy showing multiple colon                            polyps with limited preparation. Medicines:                Monitored Anesthesia Care Procedure:                Pre-Anesthesia Assessment:                           - Prior to the procedure, a History and Physical                            was performed, and patient medications and                            allergies were reviewed. The patient's tolerance of                            previous anesthesia was also reviewed. The risks                            and benefits of the procedure and the sedation                            options and risks were discussed with the patient.                            All questions were answered, and informed consent                            was obtained. Prior Anticoagulants: The patient has                            taken no anticoagulant or antiplatelet agents. ASA                            Grade Assessment: II - A patient with mild systemic                            disease. After reviewing the risks and benefits,  the patient was deemed in satisfactory condition to                            undergo the procedure.                           After obtaining informed consent, the colonoscope                            was passed under direct vision. Throughout the                            procedure, the patient's blood pressure, pulse, and                            oxygen saturations were monitored  continuously. The                            Olympus CF-[id number] SN [id number] was introduced through                            the anus and advanced to the the cecum, identified                            by appendiceal orifice and ileocecal valve. The                            colonoscopy was performed without difficulty. The                            patient tolerated the procedure well. The quality                            of the bowel preparation was adequate to identify                            polyps. Some retained stool. This was despite 2-day                            prep. Aggressive suctioning and aspiration was                            performed. Overall the examination was adequate.                            The ileocecal valve, appendiceal orifice, and                            rectum were photographed. Scope In: 3:18:43 PM Scope Out: 3:33:13 PM Scope Withdrawal Time: 0 hours 9 minutes 39 seconds  Total Procedure Duration: 0 hours 14 minutes 30 seconds  Findings:                 A 6 mm polyp was found in the mid descending colon.  The polyp was sessile. The polyp was removed with a                            cold snare. Resection and retrieval were complete.                            A tattoo was noted in the proximal descending                            colon. No residual/ recurrent colon polyps were                            noted.                           A single medium-sized angiodysplastic lesion                            without bleeding was found in the proximal sigmoid                            colon. Another large AVM was noted in the cecum                            without bleeding.                           A few small-mouthed diverticula were found in the                            sigmoid colon and in the ascending colon.                           Non-bleeding internal hemorrhoids were found during                             retroflexion. The hemorrhoids were moderate.                           The exam was otherwise without abnormality on                            direct and retroflexion views. Complications:            No immediate complications. Estimated Blood Loss:     Estimated blood loss: none. Impression:               - One 6 mm polyp in the mid descending colon,                            removed with a cold snare. Resected and retrieved.                           - Cecal and sigmoid nonbleeding AVM.                           -  Mild pancolonic diverticulosis.                           - Non-bleeding internal hemorrhoids.                           - The examination was otherwise normal on direct                            and retroflexion views. Recommendation:           - Patient has a contact number available for                            emergencies. The signs and symptoms of potential                            delayed complications were discussed with the                            patient. Return to normal activities tomorrow.                            Written discharge instructions were provided to the                            patient.                           - Resume previous diet.                           - Continue present medications.                           - Await pathology results.                           - Repeat colonoscopy for surveillance based on                            pathology results. Likely not needed due to                            advanced age.                           - The findings and recommendations were discussed                            with the patient's family. [name], MD 11/26/2022 3:44:49 PM This report has been signed electronically.

## 2022-11-26 NOTE — Op Note (Signed)
[name] Procedure Date: 11/26/2022 3:05 PM MRN: [id number] Endoscopist: [name] , MD, [id number] Age: 72 Referring MD:  Date of Birth: 20-Jul-1951 Gender: Male Account #: [id number] Procedure:                Upper GI endoscopy Indications:              Epigastric abdominal pain. H/O Cholangiocarcinoma                            s/p pylorus sparing Whipple's. Medicines:                Monitored Anesthesia Care Procedure:                Pre-Anesthesia Assessment:                           - Prior to the procedure, a History and Physical                            was performed, and patient medications and                            allergies were reviewed. The patient's tolerance of                            previous anesthesia was also reviewed. The risks                            and benefits of the procedure and the sedation                            options and risks were discussed with the patient.                            All questions were answered, and informed consent                            was obtained. Prior Anticoagulants: The patient has                            taken no anticoagulant or antiplatelet agents. ASA                            Grade Assessment: III - A patient with severe                            systemic disease. After reviewing the risks and                            benefits, the patient was deemed in satisfactory                            condition to undergo the procedure.  After obtaining informed consent, the endoscope was                            passed under direct vision. Throughout the                            procedure, the patient's blood pressure, pulse, and                            oxygen saturations were monitored continuously. The                            Olympus Scope [id number] was introduced through the                            mouth, and advanced to  the third part of duodenum.                            The upper GI endoscopy was accomplished without                            difficulty. The patient tolerated the procedure                            well. Scope In: Scope Out: Findings:                 The examined esophagus was torturous in the lower                            one third of the esophagus with normal Z-line.                           Localized moderate inflammation characterized by                            erythema was found in the gastric antrum. Deformed                            anatomy due to pylorus sparing Whipple's. Biopsies                            were taken with a cold forceps for histology. No                            ulcers.                           There was evidence of a widely patent                            pancreaticoduodenectomy (Whipple) in the duodenal                            bulb, in the first portion of the  duodenum and in                            the second portion of the duodenum. This was                            characterized by healthy appearing mucosa. No                            ulcers. Complications:            No immediate complications. Estimated Blood Loss:     Estimated blood loss: none. Impression:               - Mod Gastritis. Biopsied.                           - Widely patent pancreaticoduodenectomy (Whipple),                            characterized by healthy appearing mucosa was found. Recommendation:           - Patient has a contact number available for                            emergencies. The signs and symptoms of potential                            delayed complications were discussed with the                            patient. Return to normal activities tomorrow.                            Written discharge instructions were provided to the                            patient.                           - Resume previous diet.                            - Continue present medications.                           - Await pathology results.                           - The findings and recommendations were discussed                            with the patient's family. [name], MD 11/26/2022 3:38:01 PM This report has been signed electronically.

## 2022-11-27 ENCOUNTER — Telehealth: Payer: Self-pay

## 2022-11-27 NOTE — Telephone Encounter (Signed)
Left message on follow up call. 

## 2022-11-30 ENCOUNTER — Encounter: Payer: Self-pay | Admitting: Gastroenterology

## 2022-12-12 DIAGNOSIS — I10 Essential (primary) hypertension: Secondary | ICD-10-CM | POA: Diagnosis not present

## 2022-12-12 DIAGNOSIS — D529 Folate deficiency anemia, unspecified: Secondary | ICD-10-CM | POA: Diagnosis not present

## 2022-12-12 DIAGNOSIS — D519 Vitamin B12 deficiency anemia, unspecified: Secondary | ICD-10-CM | POA: Diagnosis not present

## 2022-12-12 DIAGNOSIS — F17210 Nicotine dependence, cigarettes, uncomplicated: Secondary | ICD-10-CM | POA: Diagnosis not present

## 2022-12-12 DIAGNOSIS — J4520 Mild intermittent asthma, uncomplicated: Secondary | ICD-10-CM | POA: Diagnosis not present

## 2022-12-12 DIAGNOSIS — E559 Vitamin D deficiency, unspecified: Secondary | ICD-10-CM | POA: Diagnosis not present

## 2022-12-13 LAB — COMPREHENSIVE METABOLIC PANEL
Albumin: 3.9 (ref 3.5–5.0)
Calcium: 8.6 — AB (ref 8.7–10.7)
Globulin: 3.2
eGFR: 97.2

## 2022-12-13 LAB — CBC: RBC: 4.17 (ref 3.87–5.11)

## 2022-12-13 LAB — LIPID PANEL
Cholesterol: 165 (ref 0–200)
HDL: 109 — AB (ref 35–70)
LDL Cholesterol: 36
Triglycerides: 102 (ref 40–160)

## 2022-12-13 LAB — BASIC METABOLIC PANEL
BUN: 9 (ref 4–21)
CO2: 29 — AB (ref 13–22)
Chloride: 93 — AB (ref 99–108)
Creatinine: 0.7 (ref 0.6–1.3)
Glucose: 106
Potassium: 3.9 mEq/L (ref 3.5–5.1)
Sodium: 134 — AB (ref 137–147)

## 2022-12-13 LAB — HEPATIC FUNCTION PANEL
ALT: 47 U/L — AB (ref 10–40)
AST: 90 — AB (ref 14–40)
Alkaline Phosphatase: 71 (ref 25–125)

## 2022-12-13 LAB — CBC AND DIFFERENTIAL
HCT: 43 (ref 41–53)
Hemoglobin: 14.4 (ref 13.5–17.5)
Neutrophils Absolute: 1.9
Platelets: 209 10*3/uL (ref 150–400)
WBC: 3.6

## 2022-12-13 LAB — HEMOGLOBIN A1C: Hemoglobin A1C: 4.5

## 2022-12-13 LAB — VITAMIN B12: Vitamin B-12: 410

## 2022-12-13 LAB — TSH: TSH: 2.52 (ref 0.41–5.90)

## 2022-12-26 ENCOUNTER — Inpatient Hospital Stay: Payer: BC Managed Care – PPO | Attending: Hematology and Oncology

## 2022-12-26 DIAGNOSIS — Z08 Encounter for follow-up examination after completed treatment for malignant neoplasm: Secondary | ICD-10-CM | POA: Diagnosis not present

## 2022-12-26 DIAGNOSIS — C9210 Chronic myeloid leukemia, BCR/ABL-positive, not having achieved remission: Secondary | ICD-10-CM | POA: Diagnosis not present

## 2022-12-26 DIAGNOSIS — Z85118 Personal history of other malignant neoplasm of bronchus and lung: Secondary | ICD-10-CM | POA: Diagnosis not present

## 2022-12-26 DIAGNOSIS — C3492 Malignant neoplasm of unspecified part of left bronchus or lung: Secondary | ICD-10-CM

## 2022-12-26 DIAGNOSIS — Z8711 Personal history of peptic ulcer disease: Secondary | ICD-10-CM | POA: Insufficient documentation

## 2022-12-26 DIAGNOSIS — C249 Malignant neoplasm of biliary tract, unspecified: Secondary | ICD-10-CM

## 2022-12-26 DIAGNOSIS — Z8505 Personal history of malignant neoplasm of liver: Secondary | ICD-10-CM | POA: Diagnosis not present

## 2022-12-26 DIAGNOSIS — E538 Deficiency of other specified B group vitamins: Secondary | ICD-10-CM | POA: Diagnosis not present

## 2022-12-26 LAB — CMP (CANCER CENTER ONLY)
ALT: 32 U/L (ref 0–44)
AST: 38 U/L (ref 15–41)
Albumin: 3.6 g/dL (ref 3.5–5.0)
Alkaline Phosphatase: 62 U/L (ref 38–126)
Anion gap: 11 (ref 5–15)
BUN: 11 mg/dL (ref 8–23)
CO2: 24 mmol/L (ref 22–32)
Calcium: 8.6 mg/dL — ABNORMAL LOW (ref 8.9–10.3)
Chloride: 98 mmol/L (ref 98–111)
Creatinine: 0.68 mg/dL (ref 0.61–1.24)
GFR, Estimated: >60 mL/min (ref >60)
Glucose, Bld: 107 mg/dL — ABNORMAL HIGH (ref 70–99)
Potassium: 3.5 mmol/L (ref 3.5–5.1)
Sodium: 133 mmol/L — ABNORMAL LOW (ref 135–145)
Total Bilirubin: 0.8 mg/dL (ref 0.3–1.2)
Total Protein: 6.7 g/dL (ref 6.5–8.1)

## 2022-12-26 LAB — CBC WITH DIFFERENTIAL (CANCER CENTER ONLY)
Abs Immature Granulocytes: 0.02 10*3/uL (ref 0.00–0.07)
Basophils Absolute: 0.1 10*3/uL (ref 0.0–0.1)
Basophils Relative: 1 %
Eosinophils Absolute: 0.1 10*3/uL (ref 0.0–0.5)
Eosinophils Relative: 3 %
HCT: 38.5 % — ABNORMAL LOW (ref 39.0–52.0)
Hemoglobin: 13.4 g/dL (ref 13.0–17.0)
Immature Granulocytes: 0 %
Lymphocytes Relative: 26 %
Lymphs Abs: 1.4 10*3/uL (ref 0.7–4.0)
MCH: 34.3 pg — ABNORMAL HIGH (ref 26.0–34.0)
MCHC: 34.8 g/dL (ref 30.0–36.0)
MCV: 98.5 fL (ref 80.0–100.0)
Monocytes Absolute: 0.8 10*3/uL (ref 0.1–1.0)
Monocytes Relative: 15 %
Neutro Abs: 2.8 10*3/uL (ref 1.7–7.7)
Neutrophils Relative %: 55 %
Platelet Count: 262 10*3/uL (ref 150–400)
RBC: 3.91 MIL/uL — ABNORMAL LOW (ref 4.22–5.81)
RDW: 13.7 % (ref 11.5–15.5)
WBC Count: 5.2 10*3/uL (ref 4.0–10.5)
nRBC: 0 % (ref 0.0–0.2)

## 2022-12-27 LAB — CANCER ANTIGEN 19-9: CA 19-9: 19 U/mL (ref 0–35)

## 2022-12-27 LAB — CEA: CEA: 3.8 ng/mL (ref 0.0–4.7)

## 2022-12-31 NOTE — Progress Notes (Signed)
[facility]  [address] [place],  [place]  [address] [phone]  Clinic Day:  01/02/23  Referring physician: Lars Mage, NP  ASSESSMENT & PLAN:  Assessment & Plan: 1. 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  He had a mild relapse in May 2018, but by that August, the testing returned to complete molecular remission and he has remained in complete molecular remission since that time. PCR for BCR/ABL is pending from today. He knows to continue dasatinib daily.    2. History of stage IA non-small cell lung cancer in 2016 treated with surgery alone.  CT imaging in October 2022 did not reveal any evidence of recurrence.    3. History of stage IA cholangiocarcinoma in 2014 treated with surgery alone.  He has stable intermittent abdominal pain, which may be related to dasatinib, but we suspect this could represent adhesions from surgery.  CT imaging in October 2022 did not reveal any evidence of recurrence.   4. Rare tobacco use, but I once again advised him on the importance of complete abstinence from tobacco.    5. Mild hypocalcemia, he is taking oral supplement BID.    6. B12 deficiency.  He remains on B12 injections monthly with his primary care office.   7. History of gastric ulcers with H pylori infection.  This was treated appropriately by Dr. Chales Abrahams and he is on omeprazole.  He does not have symptoms of recurrence.     8.  Right rotator cuff shoulder surgery in early 2022.    Plan He knows to continue dasatinib daily. His BP today is at 177/84, however when he takes his BP medicine irbesartan-hydrochlorothiazide 150-12.5mg  it drops too low. He informed me that he stopped taking irbesartan-hydrochlorothiazide for the past few days and it has been normal. I advised him to continue to monitor his BP levels and check in with his PCP and discuss dosage or medication changes. His calcium is still low at 8.6 and he states that he takes 2 oral  calcium supplements daily. His potassium is at 3.5 and I advised him to increase foods high in potassium. PCR to BCR/ABL is pending today. I will see him back in 3 months with CBC, CMP, PCR for BCR/ABL 2 weeks before his appointment with me and repeat scans in the fall. The patient understands the plans discussed today and is in agreement with them.  He knows to contact our office if he develops concerns prior to his next appointment.   I provided 20 minutes of face-to-face time during this this encounter and > 50% was spent counseling as documented under my assessment and plan.    Dellia Beckwith, MD  [facility] [address] [place] [address] Dept: [phone] Dept Fax: [phone]   No orders of the defined types were placed in this encounter.    CHIEF COMPLAINT:  CC: Chronic myelogenous leukemia with history of lung cancer and cholangiocarcinoma  Current Treatment: Dasatinib 100 mg daily  HISTORY OF PRESENT ILLNESS:  Chronic myelogenous leukemia diagnosed in October 2015 when he presented with leukocytosis.  He was originally placed on nilotinib, but developed a severe pruritic rash, so was switched to dasatinib 100 mg daily.  He achieved a major molecular remission by May 2016.   He also has a history of a stage IA cholangiocarcinoma, treated with surgical resection in June 2014.   He then underwent surgical resection of  a stage IA (T1 N0 M0) non-small cell lung cancer in February 2016.  Pathology revealed a 3 cm, well-differentiated, adenocarcinoma with negative nodes.  Adjuvant chemotherapy for his lung cancer was not recommended.  He has chronic back pain and continues seeing spine specialist.  He apparently has significant degenerative disc disease.  He has also had upper abdominal pain, which he attributes to dasatinib.  He uses hydrocodone/APAP as needed for his pain.  In May 2018, PCR was positive at 0.012%,  consistent with residual CML.  Repeat PCR in August 2018 revealed a major molecular response.  His CML has remained in remission since that time.  CT chest, abdomen and pelvis in November 2019 did not reveal evidence of recurrence of either malignancy.     CT chest in August 2020 did not reveal any evidence of recurrent lung malignancy.  He has had hypocalcemia, so was instructed to take calcium 600 mg twice daily.  When he was seen in November 2020, had been having lower leg pain worsening throughout the day.  He underwent arterial ultrasound/ankle brachial indices, which was normal.  The pain was then felt to possibly be degenerative in nature.  He reported constipation due to the calcium and as his on calcium was normal at that time,  we decreased the calcium to daily.  He had worsening neutropenia and was found to have B12 and placed on B12 injections weekly for 4 weeks, then monthly.  Serum folate was normal.  He had persistent abdominal pain, so was referred to Dr. Chales Abrahams.  He was seen by Dr. Chales Abrahams in 2021 and had 2 gastric ulcers and H. Pylori infection, which was treated.  He saw Dr. Saddie Benders earlier in 2021 as well, and PSA was normal. When he was seen in August, he had recurrent hypocalcemia, with a calcium of 8.3.  He could not tolerate the calcium supplement, so we recommended that he take Tums 4 times daily.  He continued to complain of intermittent abdominal pain, which we have attributed to adhesions.  He has had rotator cuff surgery in January 2022.  He has had a colonoscopy by Dr. Chales Abrahams with findings of a large polyp.  He will be going back on August 8th to have this resected through colonoscopy.  He missed his appointment in May but Dr. Chales Abrahams told him "his labs were good".  I looked up and his PCR for BCR/ABL continues to be negative on the May reading.  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HCC)  02/15/2013 Initial Diagnosis   Carcinoma of biliary tract (HCC)   03/06/2013 Cancer Staging    Staging form: Distal Bile Ducts, AJCC 7th Edition - Clinical stage from 03/06/2013: Stage IA (T1, N0, M0) - Signed by Dellia Beckwith, MD on 11/09/2020 Staged by: Managing physician Diagnostic confirmation: Positive histology Specimen type: Excision Histopathologic type: Cholangiocarcinoma Stage prefix: Initial diagnosis Lymph-vascular invasion (LVI): LVI not present (absent)/not identified Residual tumor (R): R0 - None Stage used in treatment planning: Yes National guidelines used in treatment planning: Yes Type of national guideline used in treatment planning: NCCN   Non-small cell carcinoma of lung (HCC)  11/08/2014 Cancer Staging   Staging form: Lung, AJCC 7th Edition - Clinical stage from 11/08/2014: Stage IA (T1b, N0, M0) - Signed by Dellia Beckwith, MD on 11/09/2020 Staged by: Managing physician Diagnostic confirmation: Positive histology Specimen type: Excision Histopathologic type: Adenocarcinoma, NOS Stage prefix: Initial diagnosis Laterality: Left Tumor size (mm): 30 Histologic grade (G): G1 Lymph-vascular invasion (  LVI): LVI not present (absent)/not identified Residual tumor (R): R0 - None Pleural/elastic layer invasion: PL0 Prognostic indicators: Resection LUL Stage used in treatment planning: Yes National guidelines used in treatment planning: Yes Type of national guideline used in treatment planning: NCCN   01/31/2015 Initial Diagnosis   Non-small cell carcinoma of lung (HCC)      INTERVAL HISTORY:  Veer is here for routine follow up for chronic myelogenous leukemia with history of lung cancer and cholangiocarcinoma. Patient states that he feels well and has no complaints of pain. His BP today is at 177/84, however when he takes his BP medicine irbesartan-hydrochlorothiazide 150-12.5mg  it drops too low. He informed me that he stopped taking irbesartan-hydrochlorothiazide for the past few days and it has been normal, even this morning before his  appointment. I advised him to continue to monitor his BP levels and check in with his PCP and discuss dosage or medication changes. His calcium is still low at 8.6 and he states that he takes 2 oral calcium supplements daily. His potassium is at 3.5 and I advised him to increase foods high in potassium. PCR to BCR/ABL is pending today. I will see him back in 3 months with CBC, CMP, PCR for BCR/ABL 2 weeks before his appointment with me and repeat scans in the fall. He denies signs of infection such as sore throat, sinus drainage, cough, or urinary symptoms.  He denies fevers or recurrent chills. He denies pain. He denies nausea, vomiting, chest pain, dyspnea or cough. His appetite is good and his weight has decreased 10 pounds over last month . His weight has been low as 202lbs from 220 and his weight has now increased to 210 in total.   REVIEW OF SYSTEMS:  Review of Systems  Constitutional: Negative.  Negative for appetite change, chills, diaphoresis, fatigue, fever and unexpected weight change.  HENT:  Negative.  Negative for hearing loss, lump/mass, mouth sores, nosebleeds, sore throat, tinnitus, trouble swallowing and voice change.   Eyes: Negative.  Negative for eye problems and icterus.  Respiratory: Negative.  Negative for chest tightness, cough, hemoptysis, shortness of breath and wheezing.   Cardiovascular: Negative.  Negative for chest pain, leg swelling and palpitations.  Gastrointestinal: Negative.  Negative for abdominal distention, abdominal pain, blood in stool, constipation, diarrhea, nausea, rectal pain and vomiting.  Endocrine: Negative.   Genitourinary: Negative.  Negative for bladder incontinence, difficulty urinating, dyspareunia, dysuria, frequency, hematuria, nocturia, pelvic pain and penile discharge.   Musculoskeletal: Negative.  Negative for arthralgias, back pain, flank pain, gait problem, myalgias, neck pain and neck stiffness.  Skin: Negative.  Negative for itching, rash and  wound.  Neurological: Negative.  Negative for dizziness, extremity weakness, gait problem, headaches, light-headedness, numbness, seizures and speech difficulty.  Hematological: Negative.  Negative for adenopathy. Does not bruise/bleed easily.  Psychiatric/Behavioral: Negative.  Negative for confusion, decreased concentration, depression, sleep disturbance and suicidal ideas. The patient is not nervous/anxious.   All other systems reviewed and are negative.    VITALS:  Blood pressure (!) 177/84, pulse 73, temperature 98 F (36.7 C), temperature source Oral, resp. rate 18, height 5\' 9"  (1.753 m), weight 210 lb 11.2 oz (95.6 kg), SpO2 96 %.  Wt Readings from Last 3 Encounters:  01/02/23 210 lb 11.2 oz (95.6 kg)  11/26/22 220 lb (99.8 kg)  10/11/22 220 lb (99.8 kg)    Body mass index is 31.11 kg/m.  Performance status (ECOG): 0 - Asymptomatic  PHYSICAL EXAM:  Physical Exam Vitals and nursing  note reviewed.  Constitutional:      General: He is not in acute distress.    Appearance: Normal appearance. He is normal weight. He is not ill-appearing, toxic-appearing or diaphoretic.  HENT:     Head: Normocephalic and atraumatic.     Right Ear: Tympanic membrane, ear canal and external ear normal. There is no impacted cerumen.     Left Ear: Tympanic membrane, ear canal and external ear normal. There is no impacted cerumen.     Nose: Nose normal. No congestion or rhinorrhea.     Mouth/Throat:     Mouth: Mucous membranes are moist.     Pharynx: Oropharynx is clear. No oropharyngeal exudate or posterior oropharyngeal erythema.  Eyes:     General: No scleral icterus.       Right eye: No discharge.        Left eye: No discharge.     Extraocular Movements: Extraocular movements intact.     Conjunctiva/sclera: Conjunctivae normal.     Pupils: Pupils are equal, round, and reactive to light.  Neck:     Vascular: No carotid bruit.  Cardiovascular:     Rate and Rhythm: Normal rate and regular  rhythm.     Pulses: Normal pulses.     Heart sounds: Normal heart sounds. No murmur heard.    No friction rub. No gallop.  Pulmonary:     Effort: Pulmonary effort is normal. No respiratory distress.     Breath sounds: Normal breath sounds. No stridor. No wheezing, rhonchi or rales.  Chest:     Chest wall: No tenderness.  Abdominal:     General: Bowel sounds are normal. There is no distension.     Palpations: Abdomen is soft. There is no hepatomegaly, splenomegaly or mass.     Tenderness: There is no abdominal tenderness. There is no right CVA tenderness, left CVA tenderness, guarding or rebound.     Hernia: No hernia is present.  Musculoskeletal:        General: No swelling, tenderness, deformity or signs of injury. Normal range of motion.     Cervical back: Normal range of motion and neck supple. No rigidity or tenderness.     Right lower leg: No edema.     Left lower leg: No edema.  Lymphadenopathy:     Cervical: No cervical adenopathy.  Skin:    General: Skin is warm and dry.     Coloration: Skin is not jaundiced or pale.     Findings: No bruising, erythema, lesion or rash.  Neurological:     General: No focal deficit present.     Mental Status: He is alert and oriented to person, place, and time. Mental status is at baseline.     Cranial Nerves: No cranial nerve deficit.     Sensory: No sensory deficit.     Motor: No weakness.     Coordination: Coordination normal.     Gait: Gait normal.     Deep Tendon Reflexes: Reflexes normal.  Psychiatric:        Mood and Affect: Mood normal.        Behavior: Behavior normal.        Thought Content: Thought content normal.        Judgment: Judgment normal.     LABS:      Latest Ref Rng & Units 12/26/2022    8:33 AM 12/13/2022   12:00 AM 09/20/2022    9:20 AM  CBC  WBC 4.0 - 10.5 K/uL  5.2  3.6     5.9   Hemoglobin 13.0 - 17.0 g/dL 16.1  09.6     04.5   Hematocrit 39.0 - 52.0 % 38.5  43     38.1   Platelets 150 - 400 K/uL 262   209     215      This result is from an external source.      Latest Ref Rng & Units 12/26/2022    8:33 AM 12/13/2022   12:00 AM 09/20/2022    9:20 AM  CMP  Glucose 70 - 99 mg/dL 409   86   BUN 8 - 23 mg/dL 11  9     7    Creatinine 0.61 - 1.24 mg/dL 8.11  0.7     9.14   Sodium 135 - 145 mmol/L 133  134     132   Potassium 3.5 - 5.1 mmol/L 3.5  3.9     3.6   Chloride 98 - 111 mmol/L 98  93     100   CO2 22 - 32 mmol/L 24  29     22    Calcium 8.9 - 10.3 mg/dL 8.6  8.6     8.2   Total Protein 6.5 - 8.1 g/dL 6.7   6.5   Total Bilirubin 0.3 - 1.2 mg/dL 0.8   0.7   Alkaline Phos 38 - 126 U/L 62  71     58   AST 15 - 41 U/L 38  90     44   ALT 0 - 44 U/L 32  47     29      This result is from an external source.   Component Ref Range & Units 12/13/2022  TSH 0.41 - 5.90 2.52   Component 12/13/2022  Hemoglobin A1C 4.5   Component 12/13/2022  Vitamin B-12 410   Component Ref Range & Units 12/13/2022  Triglycerides 40 - 160 102  Cholesterol 0 - 200 165  HDL 35 - 70 109 Abnormal   LDL Cholesterol 36   Lab Results  Component Value Date   CEA1 3.8 12/26/2022   /  CEA  Date Value Ref Range Status  12/26/2022 3.8 0.0 - 4.7 ng/mL Final    Comment:    (NOTE)                             Nonsmokers          <3.9                             Smokers             <5.6 [employer] Electrochemiluminescence Immunoassay (ECLIA) Values obtained with different assay methods or kits cannot be used interchangeably.  Results cannot be interpreted as absolute evidence of the presence or absence of malignant disease. Performed At: [facility] [address] [place], [place] [id number] Jolene Schimke MD YQ:[id number]    Lab Results  Component Value Date   [phone] 19 12/26/2022   STUDIES:  No Studies Found  HISTORY:   Allergies:  Allergies  Allergen Reactions   Pneumococcal Vaccines Swelling    Current Medications: Current Outpatient Medications  Medication  Sig Dispense Refill   albuterol (PROVENTIL HFA;VENTOLIN HFA) 108 (90 BASE) MCG/ACT inhaler Inhale 2 puffs into the lungs every 6 (six) hours as needed for wheezing or shortness  of breath.     allopurinol (ZYLOPRIM) 300 MG tablet Take 300 mg by mouth daily.     Calcium Carbonate (CALCIUM 500 PO) Take 1 tablet by mouth 3 (three) times daily.     Cholecalciferol (VITAMIN D) 50 MCG (2000 UT) tablet Take 2,000 Units by mouth daily.     colchicine 0.6 MG tablet Take 1 tablet every day by oral route as directed for 3 days.     cyclobenzaprine (FLEXERIL) 5 MG tablet Take 5 mg by mouth at bedtime as needed for muscle spasms. Patient reports with neck pain only     diclofenac Sodium (VOLTAREN) 1 % GEL Apply 2 g topically at bedtime.     docusate sodium (COLACE) 100 MG capsule Take 200 mg by mouth daily.     fluticasone (FLONASE) 50 MCG/ACT nasal spray Place 2 sprays into both nostrils daily.     HYDROcodone-acetaminophen (NORCO/VICODIN) 5-325 MG tablet Take 1 tablet by mouth 4 (four) times daily as needed.     hyoscyamine (LEVSIN SL) 0.125 MG SL tablet SMARTSIG:1-2 Tablet(s) Sublingual Every 4-6 Hours PRN     irbesartan-hydrochlorothiazide (AVALIDE) 150-12.5 MG tablet Take 1 tablet by mouth daily.     methocarbamol (ROBAXIN) 750 MG tablet Take 750 mg by mouth every 6 (six) hours as needed.     omeprazole (PRILOSEC) 40 MG capsule TAKE 1 CAPSULE (40 MG TOTAL) BY MOUTH 2 (TWO) TIMES DAILY BEFORE A MEAL. 180 capsule 3   polyethylene glycol (MIRALAX) 17 g packet      SPRYCEL 100 MG tablet TAKE 1 TABLET BY MOUTH DAILY 30 tablet 5   tamsulosin (FLOMAX) 0.4 MG CAPS capsule Take 0.8 mg by mouth daily.     TRELEGY ELLIPTA 100-62.5-25 MCG/INH AEPB Inhale 1 puff into the lungs daily at 6 (six) AM.     No current facility-administered medications for this visit.    I,Jasmine M Lassiter,acting as a scribe for Dellia Beckwith, MD.,have documented all relevant documentation on the behalf of Dellia Beckwith,  MD,as directed by  Dellia Beckwith, MD while in the presence of Dellia Beckwith, MD.  I have reviewed this report as typed by the medical scribe, and it is complete and accurate.

## 2023-01-02 ENCOUNTER — Inpatient Hospital Stay (INDEPENDENT_AMBULATORY_CARE_PROVIDER_SITE_OTHER): Payer: BC Managed Care – PPO | Admitting: Oncology

## 2023-01-02 ENCOUNTER — Other Ambulatory Visit: Payer: Self-pay | Admitting: Oncology

## 2023-01-02 VITALS — BP 177/84 | HR 73 | Temp 98.0°F | Resp 18 | Ht 69.0 in | Wt 210.7 lb

## 2023-01-02 DIAGNOSIS — C3492 Malignant neoplasm of unspecified part of left bronchus or lung: Secondary | ICD-10-CM

## 2023-01-02 DIAGNOSIS — C9210 Chronic myeloid leukemia, BCR/ABL-positive, not having achieved remission: Secondary | ICD-10-CM

## 2023-01-02 DIAGNOSIS — C249 Malignant neoplasm of biliary tract, unspecified: Secondary | ICD-10-CM | POA: Diagnosis not present

## 2023-01-02 LAB — BCR-ABL1, CML/ALL, PCR, QUANT: Interpretation (BCRAL):: NEGATIVE

## 2023-01-07 ENCOUNTER — Other Ambulatory Visit: Payer: Self-pay | Admitting: Physician Assistant

## 2023-01-13 DIAGNOSIS — M545 Low back pain, unspecified: Secondary | ICD-10-CM | POA: Diagnosis not present

## 2023-01-13 DIAGNOSIS — M47812 Spondylosis without myelopathy or radiculopathy, cervical region: Secondary | ICD-10-CM | POA: Diagnosis not present

## 2023-01-13 DIAGNOSIS — M150 Primary generalized (osteo)arthritis: Secondary | ICD-10-CM | POA: Diagnosis not present

## 2023-01-13 DIAGNOSIS — M47816 Spondylosis without myelopathy or radiculopathy, lumbar region: Secondary | ICD-10-CM | POA: Diagnosis not present

## 2023-01-14 ENCOUNTER — Telehealth: Payer: Self-pay

## 2023-01-14 NOTE — Telephone Encounter (Signed)
-----   Message from Dellia Beckwith, MD sent at 01/09/2023  9:42 AM EDT ----- Regarding: call Tell him leukemia test came back clear

## 2023-01-14 NOTE — Telephone Encounter (Signed)
Attempted to contact patient. No answer. 

## 2023-01-21 ENCOUNTER — Encounter: Payer: Self-pay | Admitting: Oncology

## 2023-03-10 DIAGNOSIS — M150 Primary generalized (osteo)arthritis: Secondary | ICD-10-CM | POA: Diagnosis not present

## 2023-03-10 DIAGNOSIS — M47816 Spondylosis without myelopathy or radiculopathy, lumbar region: Secondary | ICD-10-CM | POA: Diagnosis not present

## 2023-03-10 DIAGNOSIS — M47812 Spondylosis without myelopathy or radiculopathy, cervical region: Secondary | ICD-10-CM | POA: Diagnosis not present

## 2023-03-10 DIAGNOSIS — M545 Low back pain, unspecified: Secondary | ICD-10-CM | POA: Diagnosis not present

## 2023-03-11 ENCOUNTER — Telehealth: Payer: Self-pay | Admitting: Oncology

## 2023-03-11 NOTE — Telephone Encounter (Signed)
Patient has been scheduled. Aware of appt date and time   Scheduling Message Entered by Gery Pray H on 03/11/2023 at 10:50 AM Priority: Routine <No visit type provided>  Department: [facility]  Provider:  Scheduling Notes:  Insurance not wanting to cover lab since not full 3 months, so we need to delay labs to 7/11 or later \\  And move my appt to 2 weeks later

## 2023-03-21 ENCOUNTER — Other Ambulatory Visit: Payer: BC Managed Care – PPO

## 2023-03-24 DIAGNOSIS — I729 Aneurysm of unspecified site: Secondary | ICD-10-CM | POA: Diagnosis not present

## 2023-03-24 DIAGNOSIS — J301 Allergic rhinitis due to pollen: Secondary | ICD-10-CM | POA: Diagnosis not present

## 2023-03-24 DIAGNOSIS — G2581 Restless legs syndrome: Secondary | ICD-10-CM | POA: Diagnosis not present

## 2023-03-24 DIAGNOSIS — J449 Chronic obstructive pulmonary disease, unspecified: Secondary | ICD-10-CM | POA: Diagnosis not present

## 2023-03-31 ENCOUNTER — Encounter: Payer: Self-pay | Admitting: Cardiology

## 2023-03-31 ENCOUNTER — Encounter: Payer: Self-pay | Admitting: *Deleted

## 2023-04-03 ENCOUNTER — Telehealth: Payer: Self-pay

## 2023-04-03 ENCOUNTER — Inpatient Hospital Stay: Payer: BC Managed Care – PPO | Attending: Hematology and Oncology

## 2023-04-03 DIAGNOSIS — Z85118 Personal history of other malignant neoplasm of bronchus and lung: Secondary | ICD-10-CM | POA: Insufficient documentation

## 2023-04-03 DIAGNOSIS — F32A Depression, unspecified: Secondary | ICD-10-CM | POA: Insufficient documentation

## 2023-04-03 DIAGNOSIS — F1010 Alcohol abuse, uncomplicated: Secondary | ICD-10-CM | POA: Diagnosis not present

## 2023-04-03 DIAGNOSIS — E538 Deficiency of other specified B group vitamins: Secondary | ICD-10-CM | POA: Diagnosis not present

## 2023-04-03 DIAGNOSIS — R7401 Elevation of levels of liver transaminase levels: Secondary | ICD-10-CM | POA: Diagnosis not present

## 2023-04-03 DIAGNOSIS — Z8589 Personal history of malignant neoplasm of other organs and systems: Secondary | ICD-10-CM | POA: Diagnosis not present

## 2023-04-03 DIAGNOSIS — C9210 Chronic myeloid leukemia, BCR/ABL-positive, not having achieved remission: Secondary | ICD-10-CM | POA: Insufficient documentation

## 2023-04-03 LAB — CBC WITH DIFFERENTIAL (CANCER CENTER ONLY)
Abs Immature Granulocytes: 0.01 10*3/uL (ref 0.00–0.07)
Basophils Absolute: 0 10*3/uL (ref 0.0–0.1)
Basophils Relative: 1 %
Eosinophils Absolute: 0 10*3/uL (ref 0.0–0.5)
Eosinophils Relative: 1 %
HCT: 40.6 % (ref 39.0–52.0)
Hemoglobin: 14.3 g/dL (ref 13.0–17.0)
Immature Granulocytes: 0 %
Lymphocytes Relative: 28 %
Lymphs Abs: 1.2 10*3/uL (ref 0.7–4.0)
MCH: 35.1 pg — ABNORMAL HIGH (ref 26.0–34.0)
MCHC: 35.2 g/dL (ref 30.0–36.0)
MCV: 99.8 fL (ref 80.0–100.0)
Monocytes Absolute: 0.7 10*3/uL (ref 0.1–1.0)
Monocytes Relative: 16 %
Neutro Abs: 2.3 10*3/uL (ref 1.7–7.7)
Neutrophils Relative %: 54 %
Platelet Count: 272 10*3/uL (ref 150–400)
RBC: 4.07 MIL/uL — ABNORMAL LOW (ref 4.22–5.81)
RDW: 14.3 % (ref 11.5–15.5)
WBC Count: 4.2 10*3/uL (ref 4.0–10.5)
nRBC: 0 % (ref 0.0–0.2)

## 2023-04-03 LAB — CMP (CANCER CENTER ONLY)
ALT: 66 U/L — ABNORMAL HIGH (ref 0–44)
AST: 196 U/L (ref 15–41)
Albumin: 3.4 g/dL — ABNORMAL LOW (ref 3.5–5.0)
Alkaline Phosphatase: 71 U/L (ref 38–126)
Anion gap: 8 (ref 5–15)
BUN: 7 mg/dL — ABNORMAL LOW (ref 8–23)
CO2: 24 mmol/L (ref 22–32)
Calcium: 7.8 mg/dL — ABNORMAL LOW (ref 8.9–10.3)
Chloride: 98 mmol/L (ref 98–111)
Creatinine: 0.71 mg/dL (ref 0.61–1.24)
GFR, Estimated: >60 mL/min (ref >60)
Glucose, Bld: 118 mg/dL — ABNORMAL HIGH (ref 70–99)
Potassium: 3.4 mmol/L — ABNORMAL LOW (ref 3.5–5.1)
Sodium: 130 mmol/L — ABNORMAL LOW (ref 135–145)
Total Bilirubin: 1 mg/dL (ref 0.3–1.2)
Total Protein: 6.7 g/dL (ref 6.5–8.1)

## 2023-04-03 NOTE — Telephone Encounter (Signed)
Received call from [facility] lab that pts AST is critically high @ 196.

## 2023-04-04 ENCOUNTER — Ambulatory Visit: Payer: BC Managed Care – PPO | Admitting: Oncology

## 2023-04-04 ENCOUNTER — Other Ambulatory Visit: Payer: Self-pay | Admitting: Oncology

## 2023-04-04 DIAGNOSIS — C9210 Chronic myeloid leukemia, BCR/ABL-positive, not having achieved remission: Secondary | ICD-10-CM

## 2023-04-07 LAB — BCR-ABL1, CML/ALL, PCR, QUANT
E1A2 Transcript: <0.0032 %
Interpretation (BCRAL):: NEGATIVE
b2a2 transcript: <0.0032 %
b3a2 transcript: <0.0032 %

## 2023-04-17 ENCOUNTER — Other Ambulatory Visit: Payer: Self-pay | Admitting: Oncology

## 2023-04-17 ENCOUNTER — Inpatient Hospital Stay (INDEPENDENT_AMBULATORY_CARE_PROVIDER_SITE_OTHER): Payer: BC Managed Care – PPO | Admitting: Oncology

## 2023-04-17 ENCOUNTER — Inpatient Hospital Stay: Payer: BC Managed Care – PPO

## 2023-04-17 ENCOUNTER — Telehealth: Payer: Self-pay

## 2023-04-17 ENCOUNTER — Encounter: Payer: Self-pay | Admitting: Oncology

## 2023-04-17 VITALS — BP 145/95 | HR 86 | Temp 97.8°F | Resp 18 | Ht 70.0 in | Wt 207.0 lb

## 2023-04-17 DIAGNOSIS — C249 Malignant neoplasm of biliary tract, unspecified: Secondary | ICD-10-CM

## 2023-04-17 DIAGNOSIS — C3492 Malignant neoplasm of unspecified part of left bronchus or lung: Secondary | ICD-10-CM | POA: Diagnosis not present

## 2023-04-17 DIAGNOSIS — C9210 Chronic myeloid leukemia, BCR/ABL-positive, not having achieved remission: Secondary | ICD-10-CM

## 2023-04-17 DIAGNOSIS — C221 Intrahepatic bile duct carcinoma: Secondary | ICD-10-CM

## 2023-04-17 DIAGNOSIS — F10982 Alcohol use, unspecified with alcohol-induced sleep disorder: Secondary | ICD-10-CM

## 2023-04-17 LAB — CMP (CANCER CENTER ONLY)
ALT: 100 U/L — ABNORMAL HIGH (ref 0–44)
AST: 260 U/L (ref 15–41)
Albumin: 3.2 g/dL — ABNORMAL LOW (ref 3.5–5.0)
Alkaline Phosphatase: 82 U/L (ref 38–126)
Anion gap: 10 (ref 5–15)
BUN: 7 mg/dL — ABNORMAL LOW (ref 8–23)
CO2: 21 mmol/L — ABNORMAL LOW (ref 22–32)
Calcium: 8 mg/dL — ABNORMAL LOW (ref 8.9–10.3)
Chloride: 99 mmol/L (ref 98–111)
Creatinine: 0.79 mg/dL (ref 0.61–1.24)
GFR, Estimated: >60 mL/min (ref >60)
Glucose, Bld: 130 mg/dL — ABNORMAL HIGH (ref 70–99)
Potassium: 3.8 mmol/L (ref 3.5–5.1)
Sodium: 130 mmol/L — ABNORMAL LOW (ref 135–145)
Total Bilirubin: 1 mg/dL (ref 0.3–1.2)
Total Protein: 6.5 g/dL (ref 6.5–8.1)

## 2023-04-17 LAB — HEPATITIS PANEL, ACUTE
HCV Ab: NONREACTIVE
Hep A IgM: NONREACTIVE
Hep B C IgM: NONREACTIVE
Hepatitis B Surface Ag: NONREACTIVE

## 2023-04-17 MED ORDER — MIRTAZAPINE 15 MG PO TABS
15.0000 mg | ORAL_TABLET | Freq: Every day | ORAL | 5 refills | Status: DC
Start: 2023-04-17 — End: 2023-10-08

## 2023-04-17 NOTE — Progress Notes (Signed)
[facility]  [address] [place],  [place]  [address] [phone]  Clinic Day: 04/17/2023  Referring physician: Lars Mage, NP  ASSESSMENT & PLAN:  Assessment & Plan: 1. 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  He had a mild relapse in May 2018, but by that August, 2018, the testing returned to complete molecular remission and has remained that way every since. He knows to continue dasatinib daily.    2. History of stage IA non-small cell lung cancer in 2016 treated with surgery alone.  CT imaging in October 2022 did not reveal any evidence of recurrence.    3. History of stage IA cholangiocarcinoma in 2014 treated with surgery alone.  He has stable intermittent abdominal pain, which may be related to dasatinib, but we suspect this could represent adhesions from surgery.  CT imaging in October 2022 did not reveal any evidence of recurrence.   4. Rare tobacco use, but I once again advised him on the importance of complete abstinence from tobacco.    5. Mild hypocalcemia, he is taking oral supplement BID.    6. B12 deficiency.  He remains on B12 injections monthly with his primary care office.   7. History of gastric ulcers with H pylori infection.  This was treated appropriately by Dr. Chales Abrahams and he is on omeprazole.  He does not have symptoms of recurrence.     8.  Right rotator cuff shoulder surgery in early 2022.    9. Alcohol Abuse related to grief and depression. I urged him to stop drinking all together and explained that it his harming his liver.   10. Depression and decreased appetite. I will place him on Remeron 15mg  HS to help this as well as his sleep disturbance.    Plan He continues dasatinib daily with no significant difficulties.  He continues to see a gastroenterologist. I informed him of a couple of medications that would help his appetite such as steroids which may upset his stomach more than it already is and Megace which  would put him at risk for clots. I did however, recommend Remeron to improve his appetite and aid in sleep as he doesn't sleep well at the moment, I also think he has some degree of depression from the passing of his wife. I will prescribe Remeron 15mg  HS and potentially increase his dose later to 30mg . His BCR-ABL1 was negative and shows that he is still in complete molecular remission. His CBC is normal and his CMP revealed abnormal liver functions. He had a elevated AST of 196 and ALT of 66. He a low sodium of 130, potassium of 3.4,  BUN of 7, and calcium of 7.8, he takes 2 oral calcium supplements daily. Sohum informed me that he has been drinking more to cope with the passing of his wife in March and is understandably emotional and grieving. He is trying to quit now and poured out his last bottle last night.  He has been drinking about a pint of alcohol a day. I will see him back in 3 months with CBC, CMP, CEA, CA 19.9, PCR for BCR-ABL1 and CT chest, abdomen, and pelvis. The patient understands the plans discussed today and is in agreement with them.  He knows to contact our office if he develops concerns prior to his next appointment.   I provided 25 minutes of face-to-face time during this this encounter and > 50% was spent counseling as documented under my assessment and  plan.    Dellia Beckwith, MD  [facility] AT [facility] [address] [place] [address] Dept: [phone] Dept Fax: [phone]   Orders Placed This Encounter  Procedures   Ambulatory referral to Social Work    Referral Priority:   Routine    Referral Type:   Consultation    Referral Reason:   Specialty Services Required    Number of Visits Requested:   1     CHIEF COMPLAINT:  CC: Chronic myelogenous leukemia with history of lung cancer and cholangiocarcinoma  Current Treatment: Dasatinib 100 mg daily  HISTORY OF PRESENT ILLNESS:  Chronic myelogenous  leukemia diagnosed in October 2015 when he presented with leukocytosis.  He was originally placed on nilotinib, but developed a severe pruritic rash, so was switched to dasatinib 100 mg daily.  He achieved a major molecular remission by May 2016.   He also has a history of a stage IA cholangiocarcinoma, treated with surgical resection in June 2014.   He then underwent surgical resection of a stage IA (T1 N0 M0) non-small cell lung cancer in February 2016.  Pathology revealed a 3 cm, well-differentiated, adenocarcinoma with negative nodes.  Adjuvant chemotherapy for his lung cancer was not recommended.  He has chronic back pain and continues seeing spine specialist.  He apparently has significant degenerative disc disease.  He has also had upper abdominal pain, which he attributes to dasatinib.  He uses hydrocodone/APAP as needed for his pain.  In May 2018, PCR was positive at 0.012%, consistent with residual CML.  Repeat PCR in August 2018 revealed a major molecular response.  His CML has remained in remission since that time.  CT chest, abdomen and pelvis in November 2019 did not reveal evidence of recurrence of either malignancy.     CT chest in August 2020 did not reveal any evidence of recurrent lung malignancy.  He has had hypocalcemia, so was instructed to take calcium 600 mg twice daily.  When he was seen in November 2020, had been having lower leg pain worsening throughout the day.  He underwent arterial ultrasound/ankle brachial indices, which was normal.  The pain was then felt to possibly be degenerative in nature.  He reported constipation due to the calcium and as his on calcium was normal at that time,  we decreased the calcium to daily.  He had worsening neutropenia and was found to have B12 and placed on B12 injections weekly for 4 weeks, then monthly.  Serum folate was normal.  He had persistent abdominal pain, so was referred to Dr. Chales Abrahams.  He was seen by Dr. Chales Abrahams in 2021 and had 2 gastric  ulcers and H. Pylori infection, which was treated.  He saw Dr. Saddie Benders earlier in 2021 as well, and PSA was normal. When he was seen in August, he had recurrent hypocalcemia, with a calcium of 8.3.  He could not tolerate the calcium supplement, so we recommended that he take Tums 4 times daily.  He continued to complain of intermittent abdominal pain, which we have attributed to adhesions.  He has had rotator cuff surgery in January 2022.  He has had a colonoscopy by Dr. Chales Abrahams with findings of a large polyp.  He will be going back on August 8th to have this resected through colonoscopy.  He missed his appointment in May but Dr. Chales Abrahams told him "his labs were good".  I looked up and his PCR for BCR/ABL continues to be  negative on the May reading.  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HCC)  02/15/2013 Initial Diagnosis   Carcinoma of biliary tract (HCC)   03/06/2013 Cancer Staging   Staging form: Distal Bile Ducts, AJCC 7th Edition - Clinical stage from 03/06/2013: Stage IA (T1, N0, M0) - Signed by Dellia Beckwith, MD on 11/09/2020 Staged by: Managing physician Diagnostic confirmation: Positive histology Specimen type: Excision Histopathologic type: Cholangiocarcinoma Stage prefix: Initial diagnosis Lymph-vascular invasion (LVI): LVI not present (absent)/not identified Residual tumor (R): R0 - None Stage used in treatment planning: Yes National guidelines used in treatment planning: Yes Type of national guideline used in treatment planning: NCCN   Non-small cell carcinoma of lung (HCC)  11/08/2014 Cancer Staging   Staging form: Lung, AJCC 7th Edition - Clinical stage from 11/08/2014: Stage IA (T1b, N0, M0) - Signed by Dellia Beckwith, MD on 11/09/2020 Staged by: Managing physician Diagnostic confirmation: Positive histology Specimen type: Excision Histopathologic type: Adenocarcinoma, NOS Stage prefix: Initial diagnosis Laterality: Left Tumor size (mm): 30 Histologic grade (G):  G1 Lymph-vascular invasion (LVI): LVI not present (absent)/not identified Residual tumor (R): R0 - None Pleural/elastic layer invasion: PL0 Prognostic indicators: Resection LUL Stage used in treatment planning: Yes National guidelines used in treatment planning: Yes Type of national guideline used in treatment planning: NCCN   01/31/2015 Initial Diagnosis   Non-small cell carcinoma of lung (HCC)      INTERVAL HISTORY:  Tamari is here for routine follow up for chronic myelogenous leukemia with history of lung cancer and cholangiocarcinoma. Patient states that he feels ok and complains of stomach pain and no appetite for the last 3 weeks. He continues to see a gastroenterologist. I informed him of a couple of medications that would help his appetite such as steroids which may upset his stomach more than it already is and Megace which would put him at risk for clots. I did however, recommended Remeron to improve his appetite and aid in sleep as he doesn't sleep well at the moment, I also think he has some degree of depression from the passing of his wife. I will prescribe Remeron 15mg  HS and potentially increase his dose later to 30mg . He continues dasatinib daily with no significant difficulties. His BCR-ABL1 was negative and shows that he is still in complete molecular remission. His CBC is normal and his CMP revealed abnormal liver functions. He had a elevated AST of 196 and ALT of 66. He a low sodium of 130, potassium of 3.4,  BUN of 7, and calcium of 7.8, he takes 2 oral calcium supplements daily. Demaris informed me that he has been drinking more to cope with the passing of his wife in March and is understandably emotional and grieving. He is trying to quit now and poured out his last bottle last night.  He has been drinking about a pint of alcohol a day. I will see him back in 3 months with CBC, CMP, CEA, CA 19.9, PCR for BCR-ABL1 and CT chest, abdomen, and pelvis. He denies signs of infection  such as sore throat, sinus drainage or urinary symptoms.  He denies fevers or recurrent chills. He denies nausea, vomiting, chest pain, dyspnea or cough. His appetite is no appetite and his weight has decreased 3 pounds over last 3 month . His weight has been as low as 202lbs from 220lbs.   REVIEW OF SYSTEMS:  Review of Systems  Constitutional:  Positive for appetite change. Negative for chills, diaphoresis, fatigue, fever and unexpected  weight change.  HENT:  Negative.  Negative for hearing loss, lump/mass, mouth sores, nosebleeds, sore throat, tinnitus, trouble swallowing and voice change.   Eyes: Negative.  Negative for eye problems and icterus.  Respiratory: Negative.  Negative for chest tightness, cough, hemoptysis, shortness of breath and wheezing.   Cardiovascular: Negative.  Negative for chest pain, leg swelling and palpitations.  Gastrointestinal:  Positive for abdominal pain. Negative for abdominal distention, blood in stool, constipation, diarrhea, nausea, rectal pain and vomiting.  Endocrine: Negative.   Genitourinary: Negative.  Negative for bladder incontinence, difficulty urinating, dyspareunia, dysuria, frequency, hematuria, nocturia, pelvic pain and penile discharge.   Musculoskeletal: Negative.  Negative for arthralgias, back pain, flank pain, gait problem, myalgias, neck pain and neck stiffness.  Skin: Negative.  Negative for itching, rash and wound.  Neurological: Negative.  Negative for dizziness, extremity weakness, gait problem, headaches, light-headedness, numbness, seizures and speech difficulty.  Hematological: Negative.  Negative for adenopathy. Does not bruise/bleed easily.  Psychiatric/Behavioral: Negative.  Negative for confusion, decreased concentration, depression, sleep disturbance and suicidal ideas. The patient is not nervous/anxious.   All other systems reviewed and are negative.    VITALS:  Blood pressure (!) 145/95, pulse 86, temperature 97.8 F (36.6 C),  temperature source Oral, resp. rate 18, height 5\' 10"  (1.778 m), weight 207 lb (93.9 kg), SpO2 98%.  Wt Readings from Last 3 Encounters:  04/17/23 207 lb (93.9 kg)  03/24/23 209 lb (94.8 kg)  01/02/23 210 lb 11.2 oz (95.6 kg)    Body mass index is 29.7 kg/m.  Performance status (ECOG): 0 - Asymptomatic  PHYSICAL EXAM:  Physical Exam Vitals and nursing note reviewed.  Constitutional:      General: He is not in acute distress.    Appearance: Normal appearance. He is normal weight. He is not ill-appearing, toxic-appearing or diaphoretic.  HENT:     Head: Normocephalic and atraumatic.     Right Ear: Tympanic membrane, ear canal and external ear normal. There is no impacted cerumen.     Left Ear: Tympanic membrane, ear canal and external ear normal. There is no impacted cerumen.     Nose: Nose normal. No congestion or rhinorrhea.     Mouth/Throat:     Mouth: Mucous membranes are moist.     Pharynx: Oropharynx is clear. No oropharyngeal exudate or posterior oropharyngeal erythema.  Eyes:     General: No scleral icterus.       Right eye: No discharge.        Left eye: No discharge.     Extraocular Movements: Extraocular movements intact.     Conjunctiva/sclera: Conjunctivae normal.     Pupils: Pupils are equal, round, and reactive to light.  Neck:     Vascular: No carotid bruit.  Cardiovascular:     Rate and Rhythm: Normal rate and regular rhythm.     Pulses: Normal pulses.     Heart sounds: Normal heart sounds. No murmur heard.    No friction rub. No gallop.  Pulmonary:     Effort: Pulmonary effort is normal. No respiratory distress.     Breath sounds: Normal breath sounds. No stridor. No wheezing, rhonchi or rales.  Chest:     Chest wall: No tenderness.  Abdominal:     General: Bowel sounds are normal. There is no distension.     Palpations: Abdomen is soft. There is no hepatomegaly, splenomegaly or mass.     Tenderness: There is no abdominal tenderness. There is no right  CVA  tenderness, left CVA tenderness, guarding or rebound.     Hernia: No hernia is present.  Musculoskeletal:        General: No swelling, tenderness, deformity or signs of injury. Normal range of motion.     Cervical back: Normal range of motion and neck supple. No rigidity or tenderness.     Right lower leg: No edema.     Left lower leg: No edema.  Lymphadenopathy:     Cervical: No cervical adenopathy.  Skin:    General: Skin is warm and dry.     Coloration: Skin is not jaundiced or pale.     Findings: No bruising, erythema, lesion or rash.  Neurological:     General: No focal deficit present.     Mental Status: He is alert and oriented to person, place, and time. Mental status is at baseline.     Cranial Nerves: No cranial nerve deficit.     Sensory: No sensory deficit.     Motor: No weakness.     Coordination: Coordination normal.     Gait: Gait normal.     Deep Tendon Reflexes: Reflexes normal.  Psychiatric:        Mood and Affect: Mood normal.        Behavior: Behavior normal.        Thought Content: Thought content normal.        Judgment: Judgment normal.     LABS:      Latest Ref Rng & Units 04/03/2023    8:09 AM 12/26/2022    8:33 AM 12/13/2022   12:00 AM  CBC  WBC 4.0 - 10.5 K/uL 4.2  5.2  3.6      Hemoglobin 13.0 - 17.0 g/dL 60.4  54.0  98.1      Hematocrit 39.0 - 52.0 % 40.6  38.5  43      Platelets 150 - 400 K/uL 272  262  209         This result is from an external source.      Latest Ref Rng & Units 04/17/2023    9:09 AM 04/03/2023    8:09 AM 12/26/2022    8:33 AM  CMP  Glucose 70 - 99 mg/dL 191  478  295   BUN 8 - 23 mg/dL 7  7  11    Creatinine 0.61 - 1.24 mg/dL 6.21  3.08  6.57   Sodium 135 - 145 mmol/L 130  130  133   Potassium 3.5 - 5.1 mmol/L 3.8  3.4  3.5   Chloride 98 - 111 mmol/L 99  98  98   CO2 22 - 32 mmol/L 21  24  24    Calcium 8.9 - 10.3 mg/dL 8.0  7.8  8.6   Total Protein 6.5 - 8.1 g/dL 6.5  6.7  6.7   Total Bilirubin 0.3 - 1.2 mg/dL  1.0  1.0  0.8   Alkaline Phos 38 - 126 U/L 82  71  62   AST 15 - 41 U/L 260  196  38   ALT 0 - 44 U/L 100  66  32   0 Result Notes Component Ref Range & Units 2 wk ago (04/03/23)  b2a2 transcript % <0.0032 %  b3a2 transcript % <0.0032 %  E1A2 Transcript % <0.0032     Component Ref Range & Units 12/13/2022  TSH 0.41 - 5.90 2.52   Component 12/13/2022  Hemoglobin A1C 4.5   Component 12/13/2022  Vitamin B-12 410  Component Ref Range & Units 12/13/2022  Triglycerides 40 - 160 102  Cholesterol 0 - 200 165  HDL 35 - 70 109 Abnormal   LDL Cholesterol 36   Lab Results  Component Value Date   CEA1 3.8 12/26/2022   /  CEA  Date Value Ref Range Status  12/26/2022 3.8 0.0 - 4.7 ng/mL Final    Comment:    (NOTE)                             Nonsmokers          <3.9                             Smokers             <5.6 Roche [employer] Electrochemiluminescence Immunoassay (ECLIA) Values obtained with different assay methods or kits cannot be used interchangeably.  Results cannot be interpreted as absolute evidence of the presence or absence of malignant disease. Performed At: [facility] [address] [place], [place] [id number] Jolene Schimke MD WU:[id number]    Lab Results  Component Value Date   [phone] 19 12/26/2022   STUDIES:  No Studies Found  HISTORY:   Allergies:  Allergies  Allergen Reactions   Pneumococcal Vaccines Swelling    Current Medications: Current Outpatient Medications  Medication Sig Dispense Refill   methocarbamol (ROBAXIN) 750 MG tablet Take 750 mg by mouth every 6 (six) hours as needed for muscle spasms.     albuterol (PROVENTIL HFA;VENTOLIN HFA) 108 (90 BASE) MCG/ACT inhaler Inhale 2 puffs into the lungs every 6 (six) hours as needed for wheezing or shortness of breath.     allopurinol (ZYLOPRIM) 300 MG tablet Take 300 mg by mouth daily.     Calcium Carbonate (CALCIUM 500 PO) Take 1 tablet by mouth 3 (three) times  daily.     Cholecalciferol (VITAMIN D) 50 MCG (2000 UT) tablet Take 2,000 Units by mouth daily.     diclofenac Sodium (VOLTAREN) 1 % GEL Apply 2 g topically at bedtime.     docusate sodium (COLACE) 100 MG capsule Take 200 mg by mouth daily.     fluticasone (FLONASE) 50 MCG/ACT nasal spray Place 2 sprays into both nostrils daily.     HYDROcodone-acetaminophen (NORCO) 7.5-325 MG tablet Take 1 tablet by mouth every 6 (six) hours as needed for moderate pain.     hyoscyamine (LEVSIN SL) 0.125 MG SL tablet Take 0.125 mg by mouth every 6 (six) hours as needed for cramping.     losartan-hydrochlorothiazide (HYZAAR) 100-25 MG tablet Take 1 tablet by mouth daily.     mirtazapine (REMERON) 15 MG tablet Take 1 tablet (15 mg total) by mouth at bedtime. 30 tablet 5   omeprazole (PRILOSEC) 40 MG capsule TAKE 1 CAPSULE (40 MG TOTAL) BY MOUTH 2 (TWO) TIMES DAILY BEFORE A MEAL. 180 capsule 3   SPRYCEL 100 MG tablet TAKE 1 TABLET BY MOUTH DAILY (Patient taking differently: Take 100 mg by mouth daily.) 30 tablet 5   TRELEGY ELLIPTA 100-62.5-25 MCG/INH AEPB Inhale 1 puff into the lungs daily at 6 (six) AM.     No current facility-administered medications for this visit.    I,Jasmine M Lassiter,acting as a scribe for Dellia Beckwith, MD.,have documented all relevant documentation on the behalf of Dellia Beckwith, MD,as directed by  Dellia Beckwith, MD while in the presence  of Dellia Beckwith, MD.  I have reviewed this report as typed by the medical scribe, and it is complete and accurate.

## 2023-04-17 NOTE — Telephone Encounter (Signed)
CRITICAL VALUE STICKER  CRITICAL VALUE: AST: [id number]  RECEIVER (on-site recipient of call):Bj Morlock Deneen Harts, RN AD  DATE & TIME NOTIFIED: 04/17/23 @ 1252  MESSENGER (representative from lab):Judithann Graves  MD NOTIFIED: Gilman Buttner   TIME OF NOTIFICATION:04/17/23 @ 1310  RESPONSE:  Noted. No new orders at this time.

## 2023-04-18 ENCOUNTER — Telehealth: Payer: Self-pay

## 2023-04-18 NOTE — Telephone Encounter (Signed)
[facility] Clinical Social Work  Clinical Social Work was referred by medical provider for assessment of psychosocial needs.  Clinical Social Worker contacted patient by phone to offer support and assess for needs.  Patient will call CSW back to discuss any present needs.   Marguerita Merles, LCSWA Clinical Social Worker [facility]

## 2023-04-23 ENCOUNTER — Telehealth: Payer: Self-pay

## 2023-04-23 ENCOUNTER — Telehealth: Payer: Self-pay | Admitting: Dietician

## 2023-04-23 NOTE — Telephone Encounter (Signed)
Attempted to contact patient. No answer. 

## 2023-04-23 NOTE — Telephone Encounter (Signed)
Patient screened on MST for weight loss.  He reports previous weight loss was r/t death of spouse and feels he is eating much better now and declined nutrition consult at this time. Provided my cell# in text so he has it to return call to set up a nutrition consult in future.  Gennaro Africa, RDN, LDN Registered Dietitian, [facility] Part Time Remote (Usual office hours: Tuesday-Thursday) Cell: [phone]

## 2023-04-23 NOTE — Telephone Encounter (Signed)
-----   Message from Dellia Beckwith sent at 04/23/2023  4:37 PM EDT ----- Regarding: call Tell him liver tests even worse and tests for hepatitis A, B and C are negative, so I think it is the alcohol

## 2023-04-24 ENCOUNTER — Inpatient Hospital Stay: Payer: BC Managed Care – PPO | Attending: Hematology and Oncology | Admitting: Licensed Clinical Social Worker

## 2023-04-24 DIAGNOSIS — C9210 Chronic myeloid leukemia, BCR/ABL-positive, not having achieved remission: Secondary | ICD-10-CM | POA: Insufficient documentation

## 2023-04-24 NOTE — Progress Notes (Signed)
[facility] CSW Progress Note  Clinical Child psychotherapist contacted patient by phone to follow up on initial phone call per medical provider referral. CSW introduced herself and inquired on needs regarding adjusting to loss. Patient relayed that he is adjusting and has recently starting eating and expects to see more weight gain. CSW provided emotional support and validation as pt expressed challenges with adjusting to loss. CSW offered to connect patient with support groups, pt will f/u with CSW if he becomes interested.   CSW will follow up in two weeks.  Marguerita Merles, LCSWA Clinical Social Worker [facility]

## 2023-04-25 ENCOUNTER — Ambulatory Visit: Payer: BC Managed Care – PPO | Admitting: Cardiology

## 2023-04-25 ENCOUNTER — Telehealth: Payer: Self-pay

## 2023-04-25 NOTE — Telephone Encounter (Signed)
-----   Message from Dellia Beckwith sent at 04/23/2023  4:40 PM EDT ----- Regarding: call Leukemia is still in complete remission

## 2023-04-28 ENCOUNTER — Telehealth: Payer: Self-pay

## 2023-04-28 NOTE — Telephone Encounter (Signed)
Patient notified

## 2023-04-28 NOTE — Telephone Encounter (Signed)
-----   Message from Dellia Beckwith sent at 04/23/2023  4:40 PM EDT ----- Regarding: call Leukemia is still in complete remission

## 2023-04-29 DIAGNOSIS — R1013 Epigastric pain: Secondary | ICD-10-CM | POA: Diagnosis not present

## 2023-04-29 DIAGNOSIS — R112 Nausea with vomiting, unspecified: Secondary | ICD-10-CM | POA: Diagnosis not present

## 2023-04-29 DIAGNOSIS — J159 Unspecified bacterial pneumonia: Secondary | ICD-10-CM | POA: Diagnosis not present

## 2023-04-29 DIAGNOSIS — J189 Pneumonia, unspecified organism: Secondary | ICD-10-CM | POA: Diagnosis not present

## 2023-04-29 DIAGNOSIS — K529 Noninfective gastroenteritis and colitis, unspecified: Secondary | ICD-10-CM | POA: Diagnosis not present

## 2023-04-29 DIAGNOSIS — R9431 Abnormal electrocardiogram [ECG] [EKG]: Secondary | ICD-10-CM | POA: Diagnosis not present

## 2023-04-29 DIAGNOSIS — C9210 Chronic myeloid leukemia, BCR/ABL-positive, not having achieved remission: Secondary | ICD-10-CM | POA: Diagnosis not present

## 2023-04-30 DIAGNOSIS — K529 Noninfective gastroenteritis and colitis, unspecified: Secondary | ICD-10-CM | POA: Diagnosis not present

## 2023-04-30 DIAGNOSIS — J159 Unspecified bacterial pneumonia: Secondary | ICD-10-CM | POA: Diagnosis not present

## 2023-04-30 DIAGNOSIS — C9210 Chronic myeloid leukemia, BCR/ABL-positive, not having achieved remission: Secondary | ICD-10-CM | POA: Diagnosis not present

## 2023-05-01 DIAGNOSIS — J159 Unspecified bacterial pneumonia: Secondary | ICD-10-CM | POA: Diagnosis not present

## 2023-05-01 DIAGNOSIS — C9210 Chronic myeloid leukemia, BCR/ABL-positive, not having achieved remission: Secondary | ICD-10-CM | POA: Diagnosis not present

## 2023-05-01 DIAGNOSIS — K529 Noninfective gastroenteritis and colitis, unspecified: Secondary | ICD-10-CM | POA: Diagnosis not present

## 2023-05-05 DIAGNOSIS — M47816 Spondylosis without myelopathy or radiculopathy, lumbar region: Secondary | ICD-10-CM | POA: Diagnosis not present

## 2023-05-05 DIAGNOSIS — M47812 Spondylosis without myelopathy or radiculopathy, cervical region: Secondary | ICD-10-CM | POA: Diagnosis not present

## 2023-05-05 DIAGNOSIS — M150 Primary generalized (osteo)arthritis: Secondary | ICD-10-CM | POA: Diagnosis not present

## 2023-05-05 DIAGNOSIS — M545 Low back pain, unspecified: Secondary | ICD-10-CM | POA: Diagnosis not present

## 2023-05-16 DIAGNOSIS — D513 Other dietary vitamin B12 deficiency anemia: Secondary | ICD-10-CM | POA: Diagnosis not present

## 2023-05-16 DIAGNOSIS — F17210 Nicotine dependence, cigarettes, uncomplicated: Secondary | ICD-10-CM | POA: Diagnosis not present

## 2023-05-16 DIAGNOSIS — Z09 Encounter for follow-up examination after completed treatment for conditions other than malignant neoplasm: Secondary | ICD-10-CM | POA: Diagnosis not present

## 2023-05-16 DIAGNOSIS — C9591 Leukemia, unspecified, in remission: Secondary | ICD-10-CM | POA: Diagnosis not present

## 2023-05-20 ENCOUNTER — Ambulatory Visit: Payer: BC Managed Care – PPO | Admitting: Cardiology

## 2023-05-21 ENCOUNTER — Other Ambulatory Visit: Payer: Self-pay | Admitting: Oncology

## 2023-05-21 DIAGNOSIS — C9210 Chronic myeloid leukemia, BCR/ABL-positive, not having achieved remission: Secondary | ICD-10-CM

## 2023-05-23 ENCOUNTER — Inpatient Hospital Stay: Payer: BC Managed Care – PPO

## 2023-05-23 ENCOUNTER — Telehealth: Payer: Self-pay

## 2023-05-23 ENCOUNTER — Other Ambulatory Visit: Payer: Self-pay | Admitting: Oncology

## 2023-05-23 DIAGNOSIS — R918 Other nonspecific abnormal finding of lung field: Secondary | ICD-10-CM | POA: Diagnosis not present

## 2023-05-23 DIAGNOSIS — E876 Hypokalemia: Secondary | ICD-10-CM | POA: Insufficient documentation

## 2023-05-23 DIAGNOSIS — C9210 Chronic myeloid leukemia, BCR/ABL-positive, not having achieved remission: Secondary | ICD-10-CM

## 2023-05-23 DIAGNOSIS — J984 Other disorders of lung: Secondary | ICD-10-CM | POA: Diagnosis not present

## 2023-05-23 DIAGNOSIS — T502X5A Adverse effect of carbonic-anhydrase inhibitors, benzothiadiazides and other diuretics, initial encounter: Secondary | ICD-10-CM | POA: Insufficient documentation

## 2023-05-23 HISTORY — DX: Hypokalemia: E87.6

## 2023-05-23 LAB — CMP (CANCER CENTER ONLY)
ALT: 38 U/L (ref 0–44)
AST: 92 U/L — ABNORMAL HIGH (ref 15–41)
Albumin: 3.2 g/dL — ABNORMAL LOW (ref 3.5–5.0)
Alkaline Phosphatase: 68 U/L (ref 38–126)
Anion gap: 9 (ref 5–15)
BUN: 7 mg/dL — ABNORMAL LOW (ref 8–23)
CO2: 24 mmol/L (ref 22–32)
Calcium: 7.8 mg/dL — ABNORMAL LOW (ref 8.9–10.3)
Chloride: 102 mmol/L (ref 98–111)
Creatinine: 0.51 mg/dL — ABNORMAL LOW (ref 0.61–1.24)
GFR, Estimated: >60 mL/min (ref >60)
Glucose, Bld: 112 mg/dL — ABNORMAL HIGH (ref 70–99)
Potassium: 3.2 mmol/L — ABNORMAL LOW (ref 3.5–5.1)
Sodium: 135 mmol/L (ref 135–145)
Total Bilirubin: 0.9 mg/dL (ref 0.3–1.2)
Total Protein: 6.7 g/dL (ref 6.5–8.1)

## 2023-05-23 MED ORDER — POTASSIUM CHLORIDE CRYS ER 10 MEQ PO TBCR
10.0000 meq | EXTENDED_RELEASE_TABLET | Freq: Every day | ORAL | 5 refills | Status: DC
Start: 2023-05-23 — End: 2023-10-08

## 2023-05-23 NOTE — Telephone Encounter (Signed)
Called patient and notified him of the labs results and that he needs to pick up his potassium tablets from the pharmacy.

## 2023-05-23 NOTE — Telephone Encounter (Signed)
-----   Message from Dellia Beckwith sent at 05/23/2023  1:43 PM EDT ----- Regarding: call Tell him liver tests much better but still abn.  Now his potassium is too low.  His BP med has a diuretic in it which will lower his K so I will order a low dose supplement -10 meq daily, and we'll recheck in Oct when he returns.

## 2023-05-30 ENCOUNTER — Encounter: Payer: Self-pay | Admitting: Cardiology

## 2023-05-30 ENCOUNTER — Ambulatory Visit: Payer: BC Managed Care – PPO | Attending: Cardiology | Admitting: Cardiology

## 2023-05-30 VITALS — BP 146/78 | HR 96 | Ht 68.0 in | Wt 212.0 lb

## 2023-05-30 DIAGNOSIS — J4489 Other specified chronic obstructive pulmonary disease: Secondary | ICD-10-CM | POA: Diagnosis not present

## 2023-05-30 DIAGNOSIS — C9210 Chronic myeloid leukemia, BCR/ABL-positive, not having achieved remission: Secondary | ICD-10-CM | POA: Diagnosis not present

## 2023-05-30 DIAGNOSIS — I10 Essential (primary) hypertension: Secondary | ICD-10-CM | POA: Diagnosis not present

## 2023-05-30 DIAGNOSIS — R931 Abnormal findings on diagnostic imaging of heart and coronary circulation: Secondary | ICD-10-CM | POA: Insufficient documentation

## 2023-05-30 DIAGNOSIS — R0609 Other forms of dyspnea: Secondary | ICD-10-CM | POA: Diagnosis not present

## 2023-05-30 DIAGNOSIS — C3492 Malignant neoplasm of unspecified part of left bronchus or lung: Secondary | ICD-10-CM

## 2023-05-30 DIAGNOSIS — I7121 Aneurysm of the ascending aorta, without rupture: Secondary | ICD-10-CM

## 2023-05-30 DIAGNOSIS — C249 Malignant neoplasm of biliary tract, unspecified: Secondary | ICD-10-CM | POA: Diagnosis not present

## 2023-05-30 HISTORY — DX: Abnormal findings on diagnostic imaging of heart and coronary circulation: R93.1

## 2023-05-30 MED ORDER — ASPIRIN 81 MG PO TBEC: 81.0000 mg | DELAYED_RELEASE_TABLET | Freq: Every day | ORAL | Status: DC

## 2023-05-30 NOTE — Progress Notes (Signed)
Cardiology Consultation:    Date:  05/30/2023   ID:  Kyle Keens Sr., DOB 24-May-1951, MRN [id number]  PCP:  Lars Mage, NP  Cardiologist:  Gypsy Balsam, MD   Referring MD: Marcellus Scott, MD   Chief Complaint  Patient presents with   Shortness of Breath    New pt- referred by Asthma doctor    History of Present Illness:    Kyle Keens Sr. is a 72 y.o. male who is being seen today for the evaluation of abnormal CT at the request of Chodri, Eual Fines, MD. incredible story evaluation with past medical history significant for lung cancer, cholangiocarcinoma of biliary tract, chronic myelocytic leukemia, he is a chronic smoker with quit smoking few months ago he was referred to [place] because CT of his chest still calcification of coronary arteries as well as enlargement of the aorta.  He comes to be established as a patient.  He is wife passed in May he still grieving quite severely after that.  He cries in my office.  Denies have any chest pain tightness squeezing pressure burning chest.  He described the fact that he gets short of breath quite easily.  He thinks is getting slightly worse compared to what happened a year ago.  He still works part-time with heavy equipment.  Does not have difficulty doing it.  Denies having any palpitations or dizziness.  He does have some swelling of lower extremity that he notes that evening time he keep his legs elevated to prevent swelling from getting worse.  He quit smoking few months ago he is an alcoholic he drinks now quite heavily he drinks usually liquor as well as few beers.  When asking if he thinks that is a problem he told me that he does not think so.  Does have some family history of coronary artery disease but not premature.  Not on any special diet does not exercise on the regular basis  Past Medical History:  Diagnosis Date   Abnormal transaminases 02/27/2022   Arthritis    on PRN meds   Asthma    uses inhaler   BPH (benign  prostatic hyperplasia)    CAD (coronary artery disease)    Carcinoma of biliary tract (HCC) 02/15/2013   Diagnosed in 2014, Pt had whipple done at [facility], under care by Dr. Deveron Furlong Oncologist, has f/u every 6-12 months.   Cataract    bilateral sx   Cholangiocarcinoma of biliary tract (HCC)    Chronic myelogenous leukemia (HCC)    COPD (chronic obstructive pulmonary disease) (HCC)    uses inhaler   DJD (degenerative joint disease)    Dyslipidemia    Family history of colonic polyps    Family history of prostate cancer    Gout    HTN (hypertension)    on meds   Hypercholesterolemia    Lung cancer (HCC)    Myelogenous leukemia (HCC)    OA (osteoarthritis)    PAD (peripheral artery disease) (HCC)    Personal history of colonic polyps    Renal insufficiency    Tendonitis    patellar   Thoracic aortic aneurysm (HCC) 06/27/2022    Past Surgical History:  Procedure Laterality Date   BIOPSY  04/30/2021   Procedure: BIOPSY;  Surgeon: Lemar Lofty., MD;  Location: [facility];  Service: Gastroenterology;;   Bowel duct surg  2014   Bowel cancer   CARDIAC CATHETERIZATION  2000   CHOLECYSTECTOMY  COLONOSCOPY  06/23/2015   mild sigmoid diverticulosis. small external and internal hemorrhoids. otherwise normal colonoscopy to terminal ileum   COLONOSCOPY WITH PROPOFOL N/A 04/30/2021   Procedure: COLONOSCOPY WITH PROPOFOL;  Surgeon: Meridee Score Netty Starring., MD;  Location: [facility];  Service: Gastroenterology;  Laterality: N/A;   ENDOSCOPIC MUCOSAL RESECTION N/A 04/30/2021   Procedure: ENDOSCOPIC MUCOSAL RESECTION;  Surgeon: Meridee Score Netty Starring., MD;  Location: [facility];  Service: Gastroenterology;  Laterality: N/A;   ESOPHAGOGASTRODUODENOSCOPY (EGD) WITH PROPOFOL N/A 04/30/2021   Procedure: ESOPHAGOGASTRODUODENOSCOPY (EGD) WITH PROPOFOL;  Surgeon: Meridee Score Netty Starring., MD;  Location: [facility];  Service: Gastroenterology;  Laterality: N/A;   HEMOSTASIS  CLIP PLACEMENT  04/30/2021   Procedure: HEMOSTASIS CLIP PLACEMENT;  Surgeon: Lemar Lofty., MD;  Location: [facility];  Service: Gastroenterology;;   LUNG CANCER SURGERY  09/2014   partial lobectomy   POLYPECTOMY  04/30/2021   Procedure: POLYPECTOMY;  Surgeon: Mansouraty, Netty Starring., MD;  Location: [facility];  Service: Gastroenterology;;   SHOULDER OPEN ROTATOR CUFF REPAIR Right 09/27/2020   Procedure: Right shoulder mini open rotator cuff repair, distal clavicle resection;  Surgeon: Jene Every, MD;  Location: [facility];  Service: Orthopedics;  Laterality: Right;  90 mins  Choice with Block anesthesia   SUBMUCOSAL LIFTING INJECTION  04/30/2021   Procedure: SUBMUCOSAL LIFTING INJECTION;  Surgeon: Lemar Lofty., MD;  Location: [facility];  Service: Gastroenterology;;   UPPER GASTROINTESTINAL ENDOSCOPY     WHIPPLE PROCEDURE  02/25/2013   Procedure: WHIPPLE PROCEDURE; Surgeon: Deveron Furlong, MD; Location: [facility]; Service: General; Laterality: N/A;    Current Medications: Current Meds  Medication Sig   albuterol (PROVENTIL HFA;VENTOLIN HFA) 108 (90 BASE) MCG/ACT inhaler Inhale 2 puffs into the lungs every 6 (six) hours as needed for wheezing or shortness of breath.   allopurinol (ZYLOPRIM) 300 MG tablet Take 300 mg by mouth daily.   Calcium Carbonate (CALCIUM 500 PO) Take 1 tablet by mouth 3 (three) times daily.   Cholecalciferol (VITAMIN D) 50 MCG (2000 UT) tablet Take 2,000 Units by mouth daily.   diclofenac Sodium (VOLTAREN) 1 % GEL Apply 2 g topically at bedtime.   docusate sodium (COLACE) 100 MG capsule Take 200 mg by mouth daily.   fluticasone (FLONASE) 50 MCG/ACT nasal spray Place 2 sprays into both nostrils daily.   HYDROcodone-acetaminophen (NORCO) 7.5-325 MG tablet Take 1 tablet by mouth every 6 (six) hours as needed for moderate pain.   losartan-hydrochlorothiazide (HYZAAR) 100-25 MG tablet Take 1 tablet by mouth daily.   methocarbamol (ROBAXIN) 750 MG  tablet Take 750 mg by mouth every 6 (six) hours as needed for muscle spasms.   mirtazapine (REMERON) 15 MG tablet Take 1 tablet (15 mg total) by mouth at bedtime.   omeprazole (PRILOSEC) 40 MG capsule TAKE 1 CAPSULE (40 MG TOTAL) BY MOUTH 2 (TWO) TIMES DAILY BEFORE A MEAL.   potassium chloride (KLOR-CON M) 10 MEQ tablet Take 1 tablet (10 mEq total) by mouth daily.   SPRYCEL 100 MG tablet TAKE 1 TABLET BY MOUTH DAILY (Patient taking differently: Take 100 mg by mouth daily.)   TRELEGY ELLIPTA 100-62.5-25 MCG/INH AEPB Inhale 1 puff into the lungs daily at 6 (six) AM.     Allergies:   Pneumococcal vaccines   Social History   Socioeconomic History   Marital status: Married    Spouse name: Not on file   Number of children: 2   Years of education: Not on file   Highest education level: Not on file  Occupational History   Occupation: environmental  Tobacco Use   Smoking status: Former    Current packs/day: 0.25    Average packs/day: 0.3 packs/day for 40.0 years (10.0 ttl pk-yrs)    Types: Cigarettes   Smokeless tobacco: Never  Vaping Use   Vaping status: Never Used  Substance and Sexual Activity   Alcohol use: Yes    Alcohol/week: 21.0 standard drinks of alcohol    Types: 21 Cans of beer per week   Drug use: Never   Sexual activity: Not on file  Other Topics Concern   Not on file  Social History Narrative   Married, 2 children.   Works moves heavy equipment x 30 yrs.   Social Determinants of Health   Financial Resource Strain: Not on file  Food Insecurity: Not on file  Transportation Needs: Not on file  Physical Activity: Not on file  Stress: Not on file  Social Connections: Not on file     Family History: The patient's family history includes Asthma in his sister; Cancer in his half-sister and paternal grandfather; Hypertension in his brother, mother, and sister; Prostate cancer (age of onset: 55) in his father. There is no history of Esophageal cancer, Colon polyps, Colon  cancer, Stomach cancer, Rectal cancer, Inflammatory bowel disease, Liver disease, or Pancreatic cancer. ROS:   Please see the history of present illness.    All 14 point review of systems negative except as described per history of present illness.  EKGs/Labs/Other Studies Reviewed:    The following studies were reviewed today:   EKG:  EKG Interpretation Date/Time:  Friday May 30 2023 09:07:35 EDT Ventricular Rate:  93 PR Interval:  174 QRS Duration:  146 QT Interval:  396 QTC Calculation: 492 R Axis:   -13  Text Interpretation: Normal sinus rhythm Indeterminate axis Right bundle branch block Possible Lateral infarct , age undetermined Inferior infarct , age undetermined Abnormal ECG When compared with ECG of 25-Sep-2020 09:25, Borderline criteria for Lateral infarct are now Present Inferior infarct is now Present Confirmed by Gypsy Balsam [phone]) on 05/30/2023 9:21:10 AM    Recent Labs: 12/13/2022: TSH 2.52 04/03/2023: Hemoglobin 14.3; Platelet Count 272 05/23/2023: ALT 38; BUN 7; Creatinine 0.51; Potassium 3.2; Sodium 135  Recent Lipid Panel    Component Value Date/Time   CHOL 165 12/13/2022 0000   TRIG 102 12/13/2022 0000   HDL 109 (A) 12/13/2022 0000   LDLCALC 36 12/13/2022 0000    Physical Exam:    VS:  BP (!) 146/78 (BP Location: Left Arm, Patient Position: Sitting)   Pulse 96   Ht 5\' 8"  (1.727 m)   Wt 212 lb (96.2 kg)   SpO2 94%   BMI 32.23 kg/m     Wt Readings from Last 3 Encounters:  05/30/23 212 lb (96.2 kg)  04/17/23 207 lb (93.9 kg)  03/24/23 209 lb (94.8 kg)     GEN:  Well nourished, well developed in no acute distress HEENT: Normal NECK: No JVD; No carotid bruits LYMPHATICS: No lymphadenopathy CARDIAC: RRR, no murmurs, no rubs, no gallops RESPIRATORY:  Clear to auscultation without rales, wheezing or rhonchi  ABDOMEN: Soft, non-tender, non-distended MUSCULOSKELETAL:  No edema; No deformity  SKIN: Warm and dry NEUROLOGIC:  Alert and  oriented x 3 PSYCHIATRIC:  Normal affect   ASSESSMENT:    1. Hypertension, unspecified type   2. High coronary artery calcium score   3. Aneurysm of ascending aorta without rupture (HCC)   4. COPD with asthma (HCC)  5. Non-small cell cancer of left lung (HCC)   6. Carcinoma of biliary tract (HCC)   7. CML (chronic myelocytic leukemia) (HCC)    PLAN:    In order of problems listed above:  Calcification of the coronary artery this gentleman with multiple risk factors for coronary artery disease.  I will ask him to have a Lexiscan make sure he does not have any inducible ischemia. Dyspnea exertion multifactorial obviously does have some lung issue after surgery as well as smoking likely he quit smoking.  I scheduled him to have echocardiogram to assess left ventricle ejection fraction.  In the meantime ask him to start taking 1 baby aspirin every single day. CML follow-up by oncology team.  Stable in remission right now. Cholesterol status reviewed.  His HDL is 109 LDL 36 with no medications it is a good cholesterol will continue monitoring. Smoking luckily he quit.  I encouraged him to stay away from smoking. Alcohol abuse this is probably related to grieving.  Again I instructed him to try to cut down and hopefully completely stop drinking.  He promised me that he will try to do that.   Medication Adjustments/Labs and Tests Ordered: Current medicines are reviewed at length with the patient today.  Concerns regarding medicines are outlined above.  Orders Placed This Encounter  Procedures   EKG 12-Lead   No orders of the defined types were placed in this encounter.   Signed, Georgeanna Lea, MD, [facility]. 05/30/2023 9:22 AM    [facility]

## 2023-05-30 NOTE — Addendum Note (Signed)
Addended by: Baldo Ash D on: 05/30/2023 02:45 PM   Modules accepted: Orders

## 2023-05-30 NOTE — Patient Instructions (Addendum)
Medication Instructions:   START: Aspirin 81mg  1 tablet daily  *If you need a refill on your cardiac medications before your next appointment, please call your pharmacy*   Lab Work: None ordered If you have labs (blood work) drawn today and your tests are completely normal, you will receive your results only by: MyChart Message (if you have MyChart) OR A paper copy in the mail If you have any lab test that is abnormal or we need to change your treatment, we will call you to review the results.   Testing/Procedures: Your physician has requested that you have a lexiscan myoview. For further information please visit [email or web address] Please follow instruction sheet, as given.  The test will take approximately 3 to 4 hours to complete; you may bring reading material.  If someone comes with you to your appointment, they will need to remain in the main lobby due to limited space in the testing area.   How to prepare for your Myocardial Perfusion Test: Do not eat or drink 3 hours prior to your test, except you may have water. Do not consume products containing caffeine (regular or decaffeinated) 12 hours prior to your test. (ex: coffee, chocolate, sodas, tea). Do bring a list of your current medications with you.  If not listed below, you may take your medications as normal. Do wear comfortable clothes (no dresses or overalls) and walking shoes, tennis shoes preferred (No heels or open toe shoes are allowed). Do NOT wear cologne, perfume, aftershave, or lotions (deodorant is allowed). If these instructions are not followed, your test will have to be rescheduled.   Your physician has requested that you have an echocardiogram. Echocardiography is a painless test that uses sound waves to create images of your heart. It provides your doctor with information about the size and shape of your heart and how well your heart's chambers and valves are working. This procedure takes approximately one  hour. There are no restrictions for this procedure.   Follow-Up: At [facility], you and your health needs are our priority.  As part of our continuing mission to provide you with exceptional heart care, we have created designated Provider Care Teams.  These Care Teams include your primary Cardiologist (physician) and Advanced Practice Providers (APPs -  Physician Assistants and Nurse Practitioners) who all work together to provide you with the care you need, when you need it.  We recommend signing up for the patient portal called "MyChart".  Sign up information is provided on this After Visit Summary.  MyChart is used to connect with patients for Virtual Visits (Telemedicine).  Patients are able to view lab/test results, encounter notes, upcoming appointments, etc.  Non-urgent messages can be sent to your provider as well.   To learn more about what you can do with MyChart, go to [email or web address].    Your next appointment:   3 month(s)  The format for your next appointment:   In Person  Provider:   Gypsy Balsam, MD   Other Instructions Cardiac Nuclear Scan A cardiac nuclear scan is a test that is done to check the flow of blood to your heart. It is done when you are resting and when you are exercising. The test looks for problems such as: Not enough blood reaching a portion of the heart. The heart muscle not working as it should. You may need this test if: You have heart disease. You have had lab results that are not normal. You have had heart surgery or a balloon  procedure to open up blocked arteries (angioplasty). You have chest pain. You have shortness of breath. In this test, a special dye (tracer) is put into your bloodstream. The tracer will travel to your heart. A camera will then take pictures of your heart to see how the tracer moves through your heart. This test is usually done at a hospital and takes 2-4 hours. Tell a doctor about: Any allergies you  have. All medicines you are taking, including vitamins, herbs, eye drops, creams, and over-the-counter medicines. Any problems you or family members have had with anesthetic medicines. Any blood disorders you have. Any surgeries you have had. Any medical conditions you have. Whether you are pregnant or may be pregnant. What are the risks? Generally, this is a safe test. However, problems may occur, such as: Serious chest pain and heart attack. This is only a risk if the stress portion of the test is done. Rapid heartbeat. A feeling of warmth in your chest. This feeling usually does not last long. Allergic reaction to the tracer. What happens before the test? Ask your doctor about changing or stopping your normal medicines. This is important. Follow instructions from your doctor about what you cannot eat or drink. Remove your jewelry on the day of the test. What happens during the test? An IV tube will be inserted into one of your veins. Your doctor will give you a small amount of tracer through the IV tube. You will wait for 20-40 minutes while the tracer moves through your bloodstream. Your heart will be monitored with an electrocardiogram (ECG). You will lie down on an exam table. Pictures of your heart will be taken for about 15-20 minutes. You may also have a stress test. For this test, one of these things may be done: You will be asked to exercise on a treadmill or a stationary bike. You will be given medicines that will make your heart work harder. This is done if you are unable to exercise. When blood flow to your heart has peaked, a tracer will again be given through the IV tube. After 20-40 minutes, you will get back on the exam table. More pictures will be taken of your heart. Depending on the tracer that is used, more pictures may need to be taken 3-4 hours later. Your IV tube will be removed when the test is over. The test may vary among doctors and hospitals. What happens  after the test? Ask your doctor: Whether you can return to your normal schedule, including diet, activities, and medicines. Whether you should drink more fluids. This will help to remove the tracer from your body. Drink enough fluid to keep your pee (urine) pale yellow. Ask your doctor, or the department that is doing the test: When will my results be ready? How will I get my results? Summary A cardiac nuclear scan is a test that is done to check the flow of blood to your heart. Tell your doctor whether you are pregnant or may be pregnant. Before the test, ask your doctor about changing or stopping your normal medicines. This is important. Ask your doctor whether you can return to your normal activities. You may be asked to drink more fluids. This information is not intended to replace advice given to you by your health care provider. Make sure you discuss any questions you have with your health care provider. Document Revised: 12/30/2018 Document Reviewed: 02/23/2018 Elsevier Patient Education  2021 Elsevier Inc.    Echocardiogram An echocardiogram is a  test that uses sound waves (ultrasound) to produce images of the heart. Images from an echocardiogram can provide important information about: Heart size and shape. The size and thickness and movement of your heart's walls. Heart muscle function and strength. Heart valve function or if you have stenosis. Stenosis is when the heart valves are too narrow. If blood is flowing backward through the heart valves (regurgitation). A tumor or infectious growth around the heart valves. Areas of heart muscle that are not working well because of poor blood flow or injury from a heart attack. Aneurysm detection. An aneurysm is a weak or damaged part of an artery wall. The wall bulges out from the normal force of blood pumping through the body. Tell a health care provider about: Any allergies you have. All medicines you are taking, including  vitamins, herbs, eye drops, creams, and over-the-counter medicines. Any blood disorders you have. Any surgeries you have had. Any medical conditions you have. Whether you are pregnant or may be pregnant. What are the risks? Generally, this is a safe test. However, problems may occur, including an allergic reaction to dye (contrast) that may be used during the test. What happens before the test? No specific preparation is needed. You may eat and drink normally. What happens during the test? You will take off your clothes from the waist up and put on a hospital gown. Electrodes or electrocardiogram (ECG)patches may be placed on your chest. The electrodes or patches are then connected to a device that monitors your heart rate and rhythm. You will lie down on a table for an ultrasound exam. A gel will be applied to your chest to help sound waves pass through your skin. A handheld device, called a transducer, will be pressed against your chest and moved over your heart. The transducer produces sound waves that travel to your heart and bounce back (or "echo" back) to the transducer. These sound waves will be captured in real-time and changed into images of your heart that can be viewed on a video monitor. The images will be recorded on a computer and reviewed by your health care provider. You may be asked to change positions or hold your breath for a short time. This makes it easier to get different views or better views of your heart. In some cases, you may receive contrast through an IV in one of your veins. This can improve the quality of the pictures from your heart. The procedure may vary among health care providers and hospitals.    What can I expect after the test? You may return to your normal, everyday life, including diet, activities, and medicines, unless your health care provider tells you not to do that. Follow these instructions at home: It is up to you to get the results of your test.  Ask your health care provider, or the department that is doing the test, when your results will be ready. Keep all follow-up visits. This is important. Summary An echocardiogram is a test that uses sound waves (ultrasound) to produce images of the heart. Images from an echocardiogram can provide important information about the size and shape of your heart, heart muscle function, heart valve function, and other possible heart problems. You do not need to do anything to prepare before this test. You may eat and drink normally. After the echocardiogram is completed, you may return to your normal, everyday life, unless your health care provider tells you not to do that. This information is not intended to  replace advice given to you by your health care provider. Make sure you discuss any questions you have with your health care provider. Document Revised: 05/02/2020 Document Reviewed: 05/02/2020 Elsevier Patient Education  2021 [employer].

## 2023-05-30 NOTE — Addendum Note (Signed)
Addended by: Baldo Ash D on: 05/30/2023 09:37 AM   Modules accepted: Orders

## 2023-06-03 ENCOUNTER — Other Ambulatory Visit: Payer: Self-pay | Admitting: Oncology

## 2023-06-04 ENCOUNTER — Telehealth (HOSPITAL_COMMUNITY): Payer: Self-pay | Admitting: *Deleted

## 2023-06-04 NOTE — Telephone Encounter (Signed)
Left detailed message with instructions for MPI on answering machine per DPR.

## 2023-06-09 DIAGNOSIS — I729 Aneurysm of unspecified site: Secondary | ICD-10-CM | POA: Diagnosis not present

## 2023-06-09 DIAGNOSIS — J301 Allergic rhinitis due to pollen: Secondary | ICD-10-CM | POA: Diagnosis not present

## 2023-06-09 DIAGNOSIS — G2581 Restless legs syndrome: Secondary | ICD-10-CM | POA: Diagnosis not present

## 2023-06-09 DIAGNOSIS — J449 Chronic obstructive pulmonary disease, unspecified: Secondary | ICD-10-CM | POA: Diagnosis not present

## 2023-06-11 ENCOUNTER — Ambulatory Visit: Payer: BC Managed Care – PPO

## 2023-06-17 ENCOUNTER — Ambulatory Visit: Payer: BC Managed Care – PPO

## 2023-06-19 ENCOUNTER — Ambulatory Visit: Payer: BC Managed Care – PPO

## 2023-06-24 ENCOUNTER — Telehealth: Payer: Self-pay | Admitting: Cardiology

## 2023-06-24 NOTE — Telephone Encounter (Signed)
Pt called stating that he was feeling short of breath due to lung issues he has had and is following with his pulmonologist. He would like to reschedule his Lexi scheduled for tomorrow. Sent to front desk to reschedule Lexi

## 2023-06-24 NOTE — Telephone Encounter (Signed)
Pt c/o Shortness Of Breath: STAT if SOB developed within the last 24 hours or pt is noticeably SOB on the phone  1. Are you currently SOB (can you hear that pt is SOB on the phone)? Yes & pt is not sure if this will effect his stress test tomorrow   2. How long have you been experiencing SOB? Few days   3. Are you SOB when sitting or when up moving around? Both   4. Are you currently experiencing any other symptoms? No

## 2023-06-25 ENCOUNTER — Ambulatory Visit: Payer: BC Managed Care – PPO

## 2023-06-26 ENCOUNTER — Telehealth (HOSPITAL_COMMUNITY): Payer: Self-pay | Admitting: *Deleted

## 2023-06-26 DIAGNOSIS — G2581 Restless legs syndrome: Secondary | ICD-10-CM | POA: Diagnosis not present

## 2023-06-26 DIAGNOSIS — J449 Chronic obstructive pulmonary disease, unspecified: Secondary | ICD-10-CM | POA: Diagnosis not present

## 2023-06-26 DIAGNOSIS — I729 Aneurysm of unspecified site: Secondary | ICD-10-CM | POA: Diagnosis not present

## 2023-06-26 DIAGNOSIS — R0600 Dyspnea, unspecified: Secondary | ICD-10-CM | POA: Diagnosis not present

## 2023-06-26 DIAGNOSIS — J301 Allergic rhinitis due to pollen: Secondary | ICD-10-CM | POA: Diagnosis not present

## 2023-06-26 NOTE — Telephone Encounter (Signed)
Spoke with pt and gave instructions for MPI study. 

## 2023-06-30 ENCOUNTER — Encounter: Payer: Self-pay | Admitting: Oncology

## 2023-07-02 ENCOUNTER — Telehealth: Payer: Self-pay | Admitting: Oncology

## 2023-07-02 NOTE — Telephone Encounter (Signed)
CT C/A/P has been scheduled for 07/15/23; Checking in @ 2 pm ([facility] - [address])   Notified pt of date,time and instructions. Pt is also aware that when he comes to our office on 07/14/23 he is to pick up his contrast.

## 2023-07-03 ENCOUNTER — Ambulatory Visit: Payer: BC Managed Care – PPO

## 2023-07-10 ENCOUNTER — Ambulatory Visit: Payer: BC Managed Care – PPO | Attending: Cardiology

## 2023-07-10 VITALS — Ht 68.0 in | Wt 212.0 lb

## 2023-07-10 DIAGNOSIS — R931 Abnormal findings on diagnostic imaging of heart and coronary circulation: Secondary | ICD-10-CM

## 2023-07-10 DIAGNOSIS — R0609 Other forms of dyspnea: Secondary | ICD-10-CM | POA: Diagnosis not present

## 2023-07-10 MED ORDER — TECHNETIUM TC 99M TETROFOSMIN IV KIT
10.3000 | PACK | Freq: Once | INTRAVENOUS | Status: AC | PRN
  Administered 2023-07-10: 10.3 via INTRAVENOUS

## 2023-07-10 MED ORDER — TECHNETIUM TC 99M TETROFOSMIN IV KIT
31.1000 | PACK | Freq: Once | INTRAVENOUS | Status: AC | PRN
  Administered 2023-07-10: 31.1 via INTRAVENOUS

## 2023-07-10 MED ORDER — REGADENOSON 0.4 MG/5ML IV SOLN
0.4000 mg | Freq: Once | INTRAVENOUS | Status: AC
Start: 2023-07-10 — End: 2023-07-10
  Administered 2023-07-10: 0.4 mg via INTRAVENOUS

## 2023-07-14 ENCOUNTER — Inpatient Hospital Stay: Payer: BC Managed Care – PPO | Attending: Hematology and Oncology

## 2023-07-14 DIAGNOSIS — E538 Deficiency of other specified B group vitamins: Secondary | ICD-10-CM | POA: Insufficient documentation

## 2023-07-14 DIAGNOSIS — Z85118 Personal history of other malignant neoplasm of bronchus and lung: Secondary | ICD-10-CM | POA: Insufficient documentation

## 2023-07-14 DIAGNOSIS — Z902 Acquired absence of lung [part of]: Secondary | ICD-10-CM | POA: Insufficient documentation

## 2023-07-14 DIAGNOSIS — Z8509 Personal history of malignant neoplasm of other digestive organs: Secondary | ICD-10-CM | POA: Insufficient documentation

## 2023-07-14 DIAGNOSIS — C9210 Chronic myeloid leukemia, BCR/ABL-positive, not having achieved remission: Secondary | ICD-10-CM | POA: Insufficient documentation

## 2023-07-15 ENCOUNTER — Other Ambulatory Visit: Payer: BC Managed Care – PPO

## 2023-07-15 LAB — MYOCARDIAL PERFUSION IMAGING
LV dias vol: 98 mL (ref 62–150)
LV sys vol: 30 mL
Nuc Stress EF: 70 %
Peak HR: 96 {beats}/min
Rest HR: 73 {beats}/min
Rest Nuclear Isotope Dose: 10.3 mCi
SDS: 0
SRS: 5
SSS: 5
ST Depression (mm): 0 mm
Stress Nuclear Isotope Dose: 31.1 mCi
TID: 1.02

## 2023-07-17 ENCOUNTER — Ambulatory Visit
Admission: RE | Admit: 2023-07-17 | Discharge: 2023-07-17 | Disposition: A | Discharge status: Home / Self Care | Payer: BC Managed Care – PPO | Source: Ambulatory Visit | Attending: Oncology | Admitting: Oncology

## 2023-07-17 ENCOUNTER — Other Ambulatory Visit: Payer: Self-pay | Admitting: Oncology

## 2023-07-17 ENCOUNTER — Inpatient Hospital Stay: Payer: BC Managed Care – PPO

## 2023-07-17 DIAGNOSIS — Z902 Acquired absence of lung [part of]: Secondary | ICD-10-CM | POA: Diagnosis not present

## 2023-07-17 DIAGNOSIS — C221 Intrahepatic bile duct carcinoma: Secondary | ICD-10-CM

## 2023-07-17 DIAGNOSIS — C3492 Malignant neoplasm of unspecified part of left bronchus or lung: Secondary | ICD-10-CM

## 2023-07-17 DIAGNOSIS — C9210 Chronic myeloid leukemia, BCR/ABL-positive, not having achieved remission: Secondary | ICD-10-CM

## 2023-07-17 DIAGNOSIS — Z8509 Personal history of malignant neoplasm of other digestive organs: Secondary | ICD-10-CM | POA: Diagnosis not present

## 2023-07-17 DIAGNOSIS — E538 Deficiency of other specified B group vitamins: Secondary | ICD-10-CM | POA: Diagnosis not present

## 2023-07-17 DIAGNOSIS — Z85118 Personal history of other malignant neoplasm of bronchus and lung: Secondary | ICD-10-CM | POA: Diagnosis not present

## 2023-07-17 LAB — CBC WITH DIFFERENTIAL (CANCER CENTER ONLY)
Abs Immature Granulocytes: 0.02 10*3/uL (ref 0.00–0.07)
Basophils Absolute: 0 10*3/uL (ref 0.0–0.1)
Basophils Relative: 0 %
Eosinophils Absolute: 0.1 10*3/uL (ref 0.0–0.5)
Eosinophils Relative: 2 %
HCT: 39 % (ref 39.0–52.0)
Hemoglobin: 13.9 g/dL (ref 13.0–17.0)
Immature Granulocytes: 0 %
Lymphocytes Relative: 21 %
Lymphs Abs: 1.2 10*3/uL (ref 0.7–4.0)
MCH: 37.5 pg — ABNORMAL HIGH (ref 26.0–34.0)
MCHC: 35.6 g/dL (ref 30.0–36.0)
MCV: 105.1 fL — ABNORMAL HIGH (ref 80.0–100.0)
Monocytes Absolute: 0.9 10*3/uL (ref 0.1–1.0)
Monocytes Relative: 15 %
Neutro Abs: 3.4 10*3/uL (ref 1.7–7.7)
Neutrophils Relative %: 62 %
Platelet Count: 184 10*3/uL (ref 150–400)
RBC: 3.71 MIL/uL — ABNORMAL LOW (ref 4.22–5.81)
RDW: 14.5 % (ref 11.5–15.5)
WBC Count: 5.6 10*3/uL (ref 4.0–10.5)
nRBC: 0 % (ref 0.0–0.2)

## 2023-07-17 LAB — CMP (CANCER CENTER ONLY)
ALT: 36 U/L (ref 0–44)
AST: 85 U/L — ABNORMAL HIGH (ref 15–41)
Albumin: 3.4 g/dL — ABNORMAL LOW (ref 3.5–5.0)
Alkaline Phosphatase: 78 U/L (ref 38–126)
Anion gap: 9 (ref 5–15)
BUN: 8 mg/dL (ref 8–23)
CO2: 24 mmol/L (ref 22–32)
Calcium: 8 mg/dL — ABNORMAL LOW (ref 8.9–10.3)
Chloride: 100 mmol/L (ref 98–111)
Creatinine: 0.6 mg/dL — ABNORMAL LOW (ref 0.61–1.24)
GFR, Estimated: >60 mL/min (ref >60)
Glucose, Bld: 105 mg/dL — ABNORMAL HIGH (ref 70–99)
Potassium: 3.3 mmol/L — ABNORMAL LOW (ref 3.5–5.1)
Sodium: 133 mmol/L — ABNORMAL LOW (ref 135–145)
Total Bilirubin: 1.2 mg/dL (ref 0.3–1.2)
Total Protein: 6.7 g/dL (ref 6.5–8.1)

## 2023-07-17 MED ORDER — IOPAMIDOL (ISOVUE-300) INJECTION 61%
100.0000 mL | Freq: Once | INTRAVENOUS | Status: AC | PRN
  Administered 2023-07-17: 100 mL via INTRAVENOUS

## 2023-07-18 ENCOUNTER — Ambulatory Visit: Payer: BC Managed Care – PPO | Admitting: Oncology

## 2023-07-18 ENCOUNTER — Other Ambulatory Visit: Payer: BC Managed Care – PPO

## 2023-07-18 LAB — CEA: CEA: 7.9 ng/mL — ABNORMAL HIGH (ref 0.0–4.7)

## 2023-07-18 LAB — CANCER ANTIGEN 19-9: CA 19-9: 36 U/mL — ABNORMAL HIGH (ref 0–35)

## 2023-07-22 ENCOUNTER — Telehealth: Payer: Self-pay

## 2023-07-22 NOTE — Telephone Encounter (Signed)
-----   Message from Gypsy Balsam sent at 07/18/2023  1:39 PM EDT ----- Stress test normal

## 2023-07-22 NOTE — Telephone Encounter (Signed)
Patient notified of results and verbalized understanding.  

## 2023-07-23 ENCOUNTER — Other Ambulatory Visit: Payer: BC Managed Care – PPO

## 2023-07-23 LAB — BCR-ABL1, CML/ALL, PCR, QUANT
E1A2 Transcript: <0.0032 %
Interpretation (BCRAL):: NEGATIVE
b2a2 transcript: <0.0032 %
b3a2 transcript: <0.0032 %

## 2023-07-29 NOTE — Progress Notes (Unsigned)
[facility]  [address] [place],  [place]  [address] [phone]  Clinic Day: 04/17/2023  Referring physician: Lars Mage, NP  ASSESSMENT & PLAN:  Assessment & Plan: 1. 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  Kyle Montoya had a mild relapse in May 2018, but by that August, 2018, the testing returned to complete molecular remission and has remained that way every since. Kyle Montoya knows to continue dasatinib daily.    2. History of stage IA non-small cell lung cancer in 2016 treated with surgery alone.  CT imaging in October 2022 did not reveal any evidence of recurrence.    3. History of stage IA cholangiocarcinoma in 2014 treated with surgery alone.  Kyle Montoya has stable intermittent abdominal pain, which may be related to dasatinib, but we suspect this could represent adhesions from surgery.  CT imaging in October 2022 did not reveal any evidence of recurrence.   4. Rare tobacco use, but I once again advised him on the importance of complete abstinence from tobacco.    5. Mild hypocalcemia, Kyle Montoya is taking oral supplement BID.    6. B12 deficiency.  Kyle Montoya remains on B12 injections monthly with his primary care office.   7. History of gastric ulcers with H pylori infection.  This was treated appropriately by Dr. Chales Abrahams and Kyle Montoya is on omeprazole.  Kyle Montoya does not have symptoms of recurrence.     8.  Right rotator cuff shoulder surgery in early 2022.    9. Alcohol Abuse related to grief and depression. I urged him to stop drinking all together and explained that it his harming his liver.   10. Depression and decreased appetite. I will place him on Remeron 15mg  HS to help this as well as his sleep disturbance.    Plan Kyle Montoya continues dasatinib daily with no significant difficulties.  Kyle Montoya continues to see a gastroenterologist. I informed him of a couple of medications that would help his appetite such as steroids which may upset his stomach more than it already is and Megace which  would put him at risk for clots. I did however, recommend Remeron to improve his appetite and aid in sleep as Kyle Montoya doesn't sleep well at the moment, I also think Kyle Montoya has some degree of depression from the passing of his wife. I will prescribe Remeron 15mg  HS and potentially increase his dose later to 30mg . His BCR-ABL1 was negative and shows that Kyle Montoya is still in complete molecular remission. His CBC is normal and his CMP revealed abnormal liver functions. Kyle Montoya had a elevated AST of 196 and ALT of 66. Kyle Montoya a low sodium of 130, potassium of 3.4,  BUN of 7, and calcium of 7.8, Kyle Montoya takes 2 oral calcium supplements daily. Kyle Montoya informed me that Kyle Montoya has been drinking more to cope with the passing of his wife in March and is understandably emotional and grieving. Kyle Montoya is trying to quit now and poured out his last bottle last night.  Kyle Montoya has been drinking about a pint of alcohol a day. I will see him back in 3 months with CBC, CMP, CEA, CA 19.9, PCR for BCR-ABL1 and CT chest, abdomen, and pelvis. The patient understands the plans discussed today and is in agreement with them.  Kyle Montoya knows to contact our office if Kyle Montoya develops concerns prior to his next appointment.   I provided 25 minutes of face-to-face time during this this encounter and > 50% was spent counseling as documented under my assessment and  plan.    Kyle Settle, MD  [facility] [address] [place] [address] Dept: [phone] Dept Fax: [phone]   No orders of the defined types were placed in this encounter.    CHIEF COMPLAINT:  CC: Chronic myelogenous leukemia with history of lung cancer and cholangiocarcinoma  Current Treatment: Dasatinib 100 mg daily  HISTORY OF PRESENT ILLNESS:  Chronic myelogenous leukemia diagnosed in October 2015 when Kyle Montoya presented with leukocytosis.  Kyle Montoya was originally placed on nilotinib, but developed a severe pruritic rash, so was switched to dasatinib  100 mg daily.  Kyle Montoya achieved a major molecular remission by May 2016.   Kyle Montoya also has a history of a stage IA cholangiocarcinoma, treated with surgical resection in June 2014.   Kyle Montoya then underwent surgical resection of a stage IA (T1 N0 M0) non-small cell lung cancer in February 2016.  Pathology revealed a 3 cm, well-differentiated, adenocarcinoma with negative nodes.  Adjuvant chemotherapy for his lung cancer was not recommended.  Kyle Montoya has chronic back pain and continues seeing spine specialist.  Kyle Montoya apparently has significant degenerative disc disease.  Kyle Montoya has also had upper abdominal pain, which Kyle Montoya attributes to dasatinib.  Kyle Montoya uses hydrocodone/APAP as needed for his pain.  In May 2018, PCR was positive at 0.012%, consistent with residual CML.  Repeat PCR in August 2018 revealed a major molecular response.  His CML has remained in remission since that time.  CT chest, abdomen and pelvis in November 2019 did not reveal evidence of recurrence of either malignancy.     CT chest in August 2020 did not reveal any evidence of recurrent lung malignancy.  Kyle Montoya has had hypocalcemia, so was instructed to take calcium 600 mg twice daily.  When Kyle Montoya was seen in November 2020, had been having lower leg pain worsening throughout the day.  Kyle Montoya underwent arterial ultrasound/ankle brachial indices, which was normal.  The pain was then felt to possibly be degenerative in nature.  Kyle Montoya reported constipation due to the calcium and as his on calcium was normal at that time,  we decreased the calcium to daily.  Kyle Montoya had worsening neutropenia and was found to have B12 and placed on B12 injections weekly for 4 weeks, then monthly.  Serum folate was normal.  Kyle Montoya had persistent abdominal pain, so was referred to Dr. Chales Abrahams.  Kyle Montoya was seen by Dr. Chales Abrahams in 2021 and had 2 gastric ulcers and H. Pylori infection, which was treated.  Kyle Montoya saw Dr. Saddie Benders earlier in 2021 as well, and PSA was normal. When Kyle Montoya was seen in August, Kyle Montoya had recurrent hypocalcemia, with a  calcium of 8.3.  Kyle Montoya could not tolerate the calcium supplement, so we recommended that Kyle Montoya take Tums 4 times daily.  Kyle Montoya continued to complain of intermittent abdominal pain, which we have attributed to adhesions.  Kyle Montoya has had rotator cuff surgery in January 2022.  Kyle Montoya has had a colonoscopy by Dr. Chales Abrahams with findings of a large polyp.  Kyle Montoya will be going back on August 8th to have this resected through colonoscopy.  Kyle Montoya missed his appointment in May but Dr. Chales Abrahams told him "his labs were good".  I looked up and his PCR for BCR/ABL continues to be negative on the May reading.  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HCC)  02/15/2013 Initial Diagnosis   Carcinoma of biliary tract (HCC)   03/06/2013 Cancer Staging   Staging form: Distal Bile Ducts, AJCC 7th  Edition - Clinical stage from 03/06/2013: Stage IA (T1, N0, M0) - Signed by Dellia Beckwith, MD on 11/09/2020 Staged by: Managing physician Diagnostic confirmation: Positive histology Specimen type: Excision Histopathologic type: Cholangiocarcinoma Stage prefix: Initial diagnosis Lymph-vascular invasion (LVI): LVI not present (absent)/not identified Residual tumor (R): R0 - None Stage used in treatment planning: Yes National guidelines used in treatment planning: Yes Type of national guideline used in treatment planning: NCCN   Non-small cell carcinoma of lung (HCC)  11/08/2014 Cancer Staging   Staging form: Lung, AJCC 7th Edition - Clinical stage from 11/08/2014: Stage IA (T1b, N0, M0) - Signed by Dellia Beckwith, MD on 11/09/2020 Staged by: Managing physician Diagnostic confirmation: Positive histology Specimen type: Excision Histopathologic type: Adenocarcinoma, NOS Stage prefix: Initial diagnosis Laterality: Left Tumor size (mm): 30 Histologic grade (G): G1 Lymph-vascular invasion (LVI): LVI not present (absent)/not identified Residual tumor (R): R0 - None Pleural/elastic layer invasion: PL0 Prognostic indicators: Resection  LUL Stage used in treatment planning: Yes National guidelines used in treatment planning: Yes Type of national guideline used in treatment planning: NCCN   01/31/2015 Initial Diagnosis   Non-small cell carcinoma of lung (HCC)      INTERVAL HISTORY:  Kyle Montoya is here for routine follow up for chronic myelogenous leukemia with history of lung cancer and cholangiocarcinoma. Patient states that Kyle Montoya feels ok and complains of stomach pain and no appetite for the last 3 weeks. Kyle Montoya continues to see a gastroenterologist. I informed him of a couple of medications that would help his appetite such as steroids which may upset his stomach more than it already is and Megace which would put him at risk for clots. I did however, recommended Remeron to improve his appetite and aid in sleep as Kyle Montoya doesn't sleep well at the moment, I also think Kyle Montoya has some degree of depression from the passing of his wife. I will prescribe Remeron 15mg  HS and potentially increase his dose later to 30mg . Kyle Montoya continues dasatinib daily with no significant difficulties. His BCR-ABL1 was negative and shows that Kyle Montoya is still in complete molecular remission. His CBC is normal and his CMP revealed abnormal liver functions. Kyle Montoya had a elevated AST of 196 and ALT of 66. Kyle Montoya a low sodium of 130, potassium of 3.4,  BUN of 7, and calcium of 7.8, Kyle Montoya takes 2 oral calcium supplements daily. Zed informed me that Kyle Montoya has been drinking more to cope with the passing of his wife in March and is understandably emotional and grieving. Kyle Montoya is trying to quit now and poured out his last bottle last night.  Kyle Montoya has been drinking about a pint of alcohol a day. I will see him back in 3 months with CBC, CMP, CEA, CA 19.9, PCR for BCR-ABL1 and CT chest, abdomen, and pelvis. Kyle Montoya denies signs of infection such as sore throat, sinus drainage or urinary symptoms.  Kyle Montoya denies fevers or recurrent chills. Kyle Montoya denies nausea, vomiting, chest pain, dyspnea or cough. His appetite is no  appetite and his weight has decreased 3 pounds over last 3 month . His weight has been as low as 202lbs from 220lbs.   REVIEW OF SYSTEMS:  Review of Systems  Constitutional:  Positive for appetite change. Negative for chills, diaphoresis, fatigue, fever and unexpected weight change.  HENT:  Negative.  Negative for hearing loss, lump/mass, mouth sores, nosebleeds, sore throat, tinnitus, trouble swallowing and voice change.   Eyes: Negative.  Negative for eye problems and icterus.  Respiratory: Negative.  Negative  for chest tightness, cough, hemoptysis, shortness of breath and wheezing.   Cardiovascular: Negative.  Negative for chest pain, leg swelling and palpitations.  Gastrointestinal:  Positive for abdominal pain. Negative for abdominal distention, blood in stool, constipation, diarrhea, nausea, rectal pain and vomiting.  Endocrine: Negative.   Genitourinary: Negative.  Negative for bladder incontinence, difficulty urinating, dyspareunia, dysuria, frequency, hematuria, nocturia, pelvic pain and penile discharge.   Musculoskeletal: Negative.  Negative for arthralgias, back pain, flank pain, gait problem, myalgias, neck pain and neck stiffness.  Skin: Negative.  Negative for itching, rash and wound.  Neurological: Negative.  Negative for dizziness, extremity weakness, gait problem, headaches, light-headedness, numbness, seizures and speech difficulty.  Hematological: Negative.  Negative for adenopathy. Does not bruise/bleed easily.  Psychiatric/Behavioral: Negative.  Negative for confusion, decreased concentration, depression, sleep disturbance and suicidal ideas. The patient is not nervous/anxious.   All other systems reviewed and are negative.    VITALS:  There were no vitals taken for this visit.  Wt Readings from Last 3 Encounters:  07/10/23 212 lb (96.2 kg)  06/17/23 212 lb (96.2 kg)  05/30/23 212 lb (96.2 kg)    There is no height or weight on file to calculate BMI.  Performance  status (ECOG): 0 - Asymptomatic  PHYSICAL EXAM:  Physical Exam Vitals and nursing note reviewed.  Constitutional:      General: Kyle Montoya is not in acute distress.    Appearance: Normal appearance. Kyle Montoya is normal weight. Kyle Montoya is not ill-appearing, toxic-appearing or diaphoretic.  HENT:     Head: Normocephalic and atraumatic.     Right Ear: Tympanic membrane, ear canal and external ear normal. There is no impacted cerumen.     Left Ear: Tympanic membrane, ear canal and external ear normal. There is no impacted cerumen.     Nose: Nose normal. No congestion or rhinorrhea.     Mouth/Throat:     Mouth: Mucous membranes are moist.     Pharynx: Oropharynx is clear. No oropharyngeal exudate or posterior oropharyngeal erythema.  Eyes:     General: No scleral icterus.       Right eye: No discharge.        Left eye: No discharge.     Extraocular Movements: Extraocular movements intact.     Conjunctiva/sclera: Conjunctivae normal.     Pupils: Pupils are equal, round, and reactive to light.  Neck:     Vascular: No carotid bruit.  Cardiovascular:     Rate and Rhythm: Normal rate and regular rhythm.     Pulses: Normal pulses.     Heart sounds: Normal heart sounds. No murmur heard.    No friction rub. No gallop.  Pulmonary:     Effort: Pulmonary effort is normal. No respiratory distress.     Breath sounds: Normal breath sounds. No stridor. No wheezing, rhonchi or rales.  Chest:     Chest wall: No tenderness.  Abdominal:     General: Bowel sounds are normal. There is no distension.     Palpations: Abdomen is soft. There is no hepatomegaly, splenomegaly or mass.     Tenderness: There is no abdominal tenderness. There is no right CVA tenderness, left CVA tenderness, guarding or rebound.     Hernia: No hernia is present.  Musculoskeletal:        General: No swelling, tenderness, deformity or signs of injury. Normal range of motion.     Cervical back: Normal range of motion and neck supple. No rigidity or  tenderness.  Right lower leg: No edema.     Left lower leg: No edema.  Lymphadenopathy:     Cervical: No cervical adenopathy.  Skin:    General: Skin is warm and dry.     Coloration: Skin is not jaundiced or pale.     Findings: No bruising, erythema, lesion or rash.  Neurological:     General: No focal deficit present.     Mental Status: Kyle Montoya is alert and oriented to person, place, and time. Mental status is at baseline.     Cranial Nerves: No cranial nerve deficit.     Sensory: No sensory deficit.     Motor: No weakness.     Coordination: Coordination normal.     Gait: Gait normal.     Deep Tendon Reflexes: Reflexes normal.  Psychiatric:        Mood and Affect: Mood normal.        Behavior: Behavior normal.        Thought Content: Thought content normal.        Judgment: Judgment normal.   SCANS:  CT scans of his chest/abdomen/pelvis revealed the following: FINDINGS: CT CHEST FINDINGS  Cardiovascular: Aortic atherosclerosis. No hydronephrosis. Normal heart size, without pericardial effusion. Three vessel coronary artery calcification. No central pulmonary embolism, on this non-dedicated study.  Mediastinum/Nodes: No supraclavicular adenopathy. No mediastinal or hilar adenopathy.  Lungs/Pleura: Small, right larger than left pleural effusions new since 04/29/2023. Left upper lobectomy. Residual or recurrent right lower lobe ground-glass with areas of somewhat clustered nodularity anteriorly including on 123/4. This nodularity is new since 04/29/2023.  Musculoskeletal: No acute osseous abnormality.  CT ABDOMEN PELVIS FINDINGS  Hepatobiliary: Normal liver. Cholecystectomy. No biliary duct dilatation.  Pancreas: Pancreatic body and tail mild atrophy are unchanged. Pancreatic head resection, consistent with the clinical history of Whipple procedure. No peripancreatic edema.  Spleen: Normal in size, without focal abnormality.  Adrenals/Urinary Tract: Minimal medial  limb left adrenal nodularity is unchanged back to 05/29/2022, considered benign. Normal right adrenal gland. Mildly malrotated right kidney. Normal left kidney. No hydronephrosis. Normal urinary bladder.  Stomach/Bowel: Proximal gastric underdistention. Gastrojejunostomy. Normal appendix and terminal ileum. Moderate amount of stool in the rectum. Normal small bowel.  Vascular/Lymphatic: Aortic atherosclerosis. No abdominopelvic adenopathy.  Reproductive: Normal prostate.  Other: Trace left-sided abdominal ascites on 85/2. No free intraperitoneal air. No evidence of omental or peritoneal disease.  Musculoskeletal: Remote bilateral rib fractures. Transitional S1 vertebral body.  IMPRESSION: 1. Status post left upper lobectomy, without recurrent or metastatic disease. 2. Since 04/29/2023 abdominal CT, persistent or recurrent right lower lobe ground-glass with anterior right lower lobe clustered nodularity. All favored to be infectious. 3. New bilateral pleural effusions and trace left abdominal ascites, suggesting fluid overload. 4. Status post Whipple procedure without acute superimposed process. 5. Incidental findings, including: Coronary artery atherosclerosis. Aortic Atherosclerosis (ICD10-I70.0). Possible constipation.  LABS:      Latest Ref Rng & Units 07/17/2023    8:41 AM 04/03/2023    8:09 AM 12/26/2022    8:33 AM  CBC  WBC 4.0 - 10.5 K/uL 5.6  4.2  5.2   Hemoglobin 13.0 - 17.0 g/dL 63.0  16.0  10.9   Hematocrit 39.0 - 52.0 % 39.0  40.6  38.5   Platelets 150 - 400 K/uL 184  272  262       Latest Ref Rng & Units 07/17/2023    8:41 AM 05/23/2023    8:08 AM 04/17/2023    9:09 AM  CMP  Glucose 70 - 99 mg/dL 161  096  045   BUN 8 - 23 mg/dL 8  7  7    Creatinine 0.61 - 1.24 mg/dL 4.09  8.11  9.14   Sodium 135 - 145 mmol/L 133  135  130   Potassium 3.5 - 5.1 mmol/L 3.3  3.2  3.8   Chloride 98 - 111 mmol/L 100  102  99   CO2 22 - 32 mmol/L 24  24  21    Calcium 8.9 -  10.3 mg/dL 8.0  7.8  8.0   Total Protein 6.5 - 8.1 g/dL 6.7  6.7  6.5   Total Bilirubin 0.3 - 1.2 mg/dL 1.2  0.9  1.0   Alkaline Phos 38 - 126 U/L 78  68  82   AST 15 - 41 U/L 85  92  260   ALT 0 - 44 U/L 36  38  100   0 Result Notes Component Ref Range & Units 2 wk ago (04/03/23)  b2a2 transcript % <0.0032 %  b3a2 transcript % <0.0032 %  E1A2 Transcript % <0.0032     Component Ref Range & Units 12/13/2022  TSH 0.41 - 5.90 2.52   Component 12/13/2022  Hemoglobin A1C 4.5   Component 12/13/2022  Vitamin B-12 410   Component Ref Range & Units 12/13/2022  Triglycerides 40 - 160 102  Cholesterol 0 - 200 165  HDL 35 - 70 109 Abnormal   LDL Cholesterol 36   Lab Results  Component Value Date   CEA1 7.9 (H) 07/17/2023   /  CEA  Date Value Ref Range Status  07/17/2023 7.9 (H) 0.0 - 4.7 ng/mL Final    Comment:    (NOTE)                             Nonsmokers          <3.9                             Smokers             <5.6 [employer] Electrochemiluminescence Immunoassay (ECLIA) Values obtained with different assay methods or kits cannot be used interchangeably.  Results cannot be interpreted as absolute evidence of the presence or absence of malignant disease. Performed At: [facility] [address] [place], [place] [id number] Jolene Schimke MD YQ:[id number]    Lab Results  Component Value Date   [phone] 72 (H) 07/17/2023   STUDIES:  No Studies Found  HISTORY:   Allergies:  Allergies  Allergen Reactions  . Pneumococcal Vaccines Swelling    Current Medications: Current Outpatient Medications  Medication Sig Dispense Refill  . albuterol (PROVENTIL HFA;VENTOLIN HFA) 108 (90 BASE) MCG/ACT inhaler Inhale 2 puffs into the lungs every 6 (six) hours as needed for wheezing or shortness of breath.    . allopurinol (ZYLOPRIM) 300 MG tablet Take 300 mg by mouth daily.    Marland Kitchen aspirin EC 81 MG tablet Take 1 tablet (81 mg total) by mouth daily.  Swallow whole.    . Calcium Carbonate (CALCIUM 500 PO) Take 1 tablet by mouth 3 (three) times daily.    . Cholecalciferol (VITAMIN D) 50 MCG (2000 UT) tablet Take 2,000 Units by mouth daily.    . diclofenac Sodium (VOLTAREN) 1 % GEL Apply 2 g topically at bedtime.    . docusate sodium (COLACE) 100 MG  capsule Take 200 mg by mouth daily.    . fluticasone (FLONASE) 50 MCG/ACT nasal spray Place 2 sprays into both nostrils daily.    Marland Kitchen HYDROcodone-acetaminophen (NORCO) 7.5-325 MG tablet Take 1 tablet by mouth every 6 (six) hours as needed for moderate pain.    . hyoscyamine (LEVSIN SL) 0.125 MG SL tablet Take 0.125 mg by mouth every 6 (six) hours as needed for cramping. (Patient not taking: Reported on 05/30/2023)    . losartan-hydrochlorothiazide (HYZAAR) 100-25 MG tablet Take 1 tablet by mouth daily.    . methocarbamol (ROBAXIN) 750 MG tablet Take 750 mg by mouth every 6 (six) hours as needed for muscle spasms.    . mirtazapine (REMERON) 15 MG tablet Take 1 tablet (15 mg total) by mouth at bedtime. 30 tablet 5  . omeprazole (PRILOSEC) 40 MG capsule TAKE 1 CAPSULE (40 MG TOTAL) BY MOUTH 2 (TWO) TIMES DAILY BEFORE A MEAL. 180 capsule 3  . potassium chloride (KLOR-CON M) 10 MEQ tablet Take 1 tablet (10 mEq total) by mouth daily. 30 tablet 5  . SPRYCEL 100 MG tablet TAKE 1 TABLET BY MOUTH DAILY 30 tablet 5  . TRELEGY ELLIPTA 100-62.5-25 MCG/INH AEPB Inhale 1 puff into the lungs daily at 6 (six) AM.     No current facility-administered medications for this visit.    I,Jasmine M Lassiter,acting as a Neurosurgeon for [employer], MD.,have documented all relevant documentation on the behalf of Kyle Robers Kirby Funk, MD,as directed by  Kyle Settle, MD while in the presence of Kiwan Gadsden Kirby Funk, MD.  I have reviewed this report as typed by the medical scribe, and it is complete and accurate.

## 2023-07-30 ENCOUNTER — Inpatient Hospital Stay: Payer: BC Managed Care – PPO | Attending: Hematology and Oncology | Admitting: Oncology

## 2023-07-30 ENCOUNTER — Telehealth: Payer: Self-pay | Admitting: Oncology

## 2023-07-30 ENCOUNTER — Other Ambulatory Visit: Payer: Self-pay | Admitting: Oncology

## 2023-07-30 VITALS — BP 133/85 | HR 80 | Temp 98.2°F | Resp 18 | Ht 68.0 in | Wt 215.5 lb

## 2023-07-30 DIAGNOSIS — Z8505 Personal history of malignant neoplasm of liver: Secondary | ICD-10-CM | POA: Diagnosis not present

## 2023-07-30 DIAGNOSIS — Z85118 Personal history of other malignant neoplasm of bronchus and lung: Secondary | ICD-10-CM | POA: Diagnosis not present

## 2023-07-30 DIAGNOSIS — E538 Deficiency of other specified B group vitamins: Secondary | ICD-10-CM | POA: Diagnosis not present

## 2023-07-30 DIAGNOSIS — C9210 Chronic myeloid leukemia, BCR/ABL-positive, not having achieved remission: Secondary | ICD-10-CM | POA: Diagnosis present

## 2023-07-30 NOTE — Telephone Encounter (Signed)
Patient has been scheduled for follow-up visit per 07/30/23 LOS.  Pt given an appt calendar with date and time.

## 2023-08-06 ENCOUNTER — Ambulatory Visit: Payer: BC Managed Care – PPO | Attending: Cardiology

## 2023-08-14 DIAGNOSIS — Z23 Encounter for immunization: Secondary | ICD-10-CM | POA: Diagnosis not present

## 2023-08-29 ENCOUNTER — Ambulatory Visit: Payer: BC Managed Care – PPO | Attending: Cardiology | Admitting: Cardiology

## 2023-09-09 DIAGNOSIS — I110 Hypertensive heart disease with heart failure: Secondary | ICD-10-CM | POA: Diagnosis not present

## 2023-09-09 DIAGNOSIS — I5033 Acute on chronic diastolic (congestive) heart failure: Secondary | ICD-10-CM | POA: Diagnosis not present

## 2023-09-09 DIAGNOSIS — I21A1 Myocardial infarction type 2: Secondary | ICD-10-CM | POA: Diagnosis not present

## 2023-09-10 DIAGNOSIS — I21A1 Myocardial infarction type 2: Secondary | ICD-10-CM | POA: Diagnosis not present

## 2023-09-10 DIAGNOSIS — F1010 Alcohol abuse, uncomplicated: Secondary | ICD-10-CM | POA: Diagnosis not present

## 2023-09-10 DIAGNOSIS — I361 Nonrheumatic tricuspid (valve) insufficiency: Secondary | ICD-10-CM | POA: Diagnosis not present

## 2023-09-10 DIAGNOSIS — I5033 Acute on chronic diastolic (congestive) heart failure: Secondary | ICD-10-CM | POA: Diagnosis not present

## 2023-09-10 DIAGNOSIS — J90 Pleural effusion, not elsewhere classified: Secondary | ICD-10-CM | POA: Diagnosis not present

## 2023-09-10 DIAGNOSIS — I110 Hypertensive heart disease with heart failure: Secondary | ICD-10-CM | POA: Diagnosis not present

## 2023-09-10 DIAGNOSIS — I351 Nonrheumatic aortic (valve) insufficiency: Secondary | ICD-10-CM | POA: Diagnosis not present

## 2023-09-10 DIAGNOSIS — I509 Heart failure, unspecified: Secondary | ICD-10-CM | POA: Diagnosis not present

## 2023-09-11 DIAGNOSIS — I5033 Acute on chronic diastolic (congestive) heart failure: Secondary | ICD-10-CM | POA: Diagnosis not present

## 2023-09-11 DIAGNOSIS — J90 Pleural effusion, not elsewhere classified: Secondary | ICD-10-CM | POA: Diagnosis not present

## 2023-09-11 DIAGNOSIS — I110 Hypertensive heart disease with heart failure: Secondary | ICD-10-CM | POA: Diagnosis not present

## 2023-09-11 DIAGNOSIS — I21A1 Myocardial infarction type 2: Secondary | ICD-10-CM | POA: Diagnosis not present

## 2023-09-11 DIAGNOSIS — F1010 Alcohol abuse, uncomplicated: Secondary | ICD-10-CM | POA: Diagnosis not present

## 2023-09-11 DIAGNOSIS — I509 Heart failure, unspecified: Secondary | ICD-10-CM | POA: Diagnosis not present

## 2023-09-12 DIAGNOSIS — I110 Hypertensive heart disease with heart failure: Secondary | ICD-10-CM | POA: Diagnosis not present

## 2023-09-12 DIAGNOSIS — I5033 Acute on chronic diastolic (congestive) heart failure: Secondary | ICD-10-CM | POA: Diagnosis not present

## 2023-09-12 DIAGNOSIS — F1010 Alcohol abuse, uncomplicated: Secondary | ICD-10-CM | POA: Diagnosis not present

## 2023-09-12 DIAGNOSIS — J90 Pleural effusion, not elsewhere classified: Secondary | ICD-10-CM | POA: Diagnosis not present

## 2023-09-12 DIAGNOSIS — I509 Heart failure, unspecified: Secondary | ICD-10-CM | POA: Diagnosis not present

## 2023-09-12 DIAGNOSIS — I21A1 Myocardial infarction type 2: Secondary | ICD-10-CM | POA: Diagnosis not present

## 2023-09-13 DIAGNOSIS — I5033 Acute on chronic diastolic (congestive) heart failure: Secondary | ICD-10-CM | POA: Diagnosis not present

## 2023-09-13 DIAGNOSIS — I21A1 Myocardial infarction type 2: Secondary | ICD-10-CM | POA: Diagnosis not present

## 2023-09-13 DIAGNOSIS — I110 Hypertensive heart disease with heart failure: Secondary | ICD-10-CM | POA: Diagnosis not present

## 2023-09-16 DIAGNOSIS — M545 Low back pain, unspecified: Secondary | ICD-10-CM | POA: Diagnosis not present

## 2023-09-16 DIAGNOSIS — M150 Primary generalized (osteo)arthritis: Secondary | ICD-10-CM | POA: Diagnosis not present

## 2023-09-16 DIAGNOSIS — M47816 Spondylosis without myelopathy or radiculopathy, lumbar region: Secondary | ICD-10-CM | POA: Diagnosis not present

## 2023-09-16 DIAGNOSIS — M47812 Spondylosis without myelopathy or radiculopathy, cervical region: Secondary | ICD-10-CM | POA: Diagnosis not present

## 2023-09-18 ENCOUNTER — Other Ambulatory Visit (HOSPITAL_COMMUNITY): Payer: Self-pay

## 2023-09-18 DIAGNOSIS — J189 Pneumonia, unspecified organism: Secondary | ICD-10-CM | POA: Insufficient documentation

## 2023-09-18 DIAGNOSIS — R0600 Dyspnea, unspecified: Secondary | ICD-10-CM | POA: Insufficient documentation

## 2023-09-18 LAB — BASIC METABOLIC PANEL
BUN: 14 (ref 4–21)
CO2: 27 — AB (ref 13–22)
Chloride: 91 — AB (ref 99–108)
Creatinine: 0.9 (ref 0.6–1.3)
Glucose: 86
Potassium: 4.1 meq/L (ref 3.5–5.1)
Sodium: 126 — AB (ref 137–147)

## 2023-09-18 LAB — HEPATIC FUNCTION PANEL
ALT: 107 U/L — AB (ref 10–40)
AST: 175 — AB (ref 14–40)
Alkaline Phosphatase: 98 (ref 25–125)
Bilirubin, Total: 0.7

## 2023-09-18 LAB — CBC AND DIFFERENTIAL
HCT: 42 — AB (ref 41–53)
Hemoglobin: 13.1 — AB (ref 13.5–17.5)
Neutrophils Absolute: 1.46
Platelets: 406 10*3/uL — AB (ref 150–400)
WBC: 3.9

## 2023-09-18 LAB — CBC: RBC: 3.56 — AB (ref 3.87–5.11)

## 2023-09-18 LAB — COMPREHENSIVE METABOLIC PANEL
Albumin: 3.2 — AB (ref 3.5–5.0)
Calcium: 8.5 — AB (ref 8.7–10.7)
Globulin: 3.6
eGFR: 90

## 2023-09-19 ENCOUNTER — Encounter: Payer: Self-pay | Admitting: Oncology

## 2023-09-19 DIAGNOSIS — E871 Hypo-osmolality and hyponatremia: Secondary | ICD-10-CM | POA: Insufficient documentation

## 2023-09-19 HISTORY — DX: Hypo-osmolality and hyponatremia: E87.1

## 2023-09-20 DIAGNOSIS — E871 Hypo-osmolality and hyponatremia: Secondary | ICD-10-CM | POA: Diagnosis not present

## 2023-09-22 LAB — BRAIN NATRIURETIC PEPTIDE: B Natriuretic Peptide: 88.8

## 2023-09-22 LAB — MAGNESIUM: Magnesium: 1.99

## 2023-09-23 ENCOUNTER — Other Ambulatory Visit: Payer: Self-pay | Admitting: Oncology

## 2023-09-23 DIAGNOSIS — C221 Intrahepatic bile duct carcinoma: Secondary | ICD-10-CM

## 2023-09-24 DIAGNOSIS — C9210 Chronic myeloid leukemia, BCR/ABL-positive, not having achieved remission: Secondary | ICD-10-CM | POA: Diagnosis not present

## 2023-09-24 DIAGNOSIS — R600 Localized edema: Secondary | ICD-10-CM | POA: Diagnosis not present

## 2023-09-25 ENCOUNTER — Inpatient Hospital Stay: Payer: BC Managed Care – PPO | Attending: Hematology and Oncology

## 2023-09-25 ENCOUNTER — Other Ambulatory Visit (HOSPITAL_COMMUNITY): Payer: Self-pay

## 2023-09-25 DIAGNOSIS — C9210 Chronic myeloid leukemia, BCR/ABL-positive, not having achieved remission: Secondary | ICD-10-CM | POA: Insufficient documentation

## 2023-09-25 DIAGNOSIS — E7800 Pure hypercholesterolemia, unspecified: Secondary | ICD-10-CM | POA: Insufficient documentation

## 2023-09-25 DIAGNOSIS — E785 Hyperlipidemia, unspecified: Secondary | ICD-10-CM | POA: Insufficient documentation

## 2023-09-25 DIAGNOSIS — J449 Chronic obstructive pulmonary disease, unspecified: Secondary | ICD-10-CM | POA: Insufficient documentation

## 2023-09-25 DIAGNOSIS — I739 Peripheral vascular disease, unspecified: Secondary | ICD-10-CM | POA: Insufficient documentation

## 2023-09-25 DIAGNOSIS — Z85118 Personal history of other malignant neoplasm of bronchus and lung: Secondary | ICD-10-CM | POA: Insufficient documentation

## 2023-09-25 DIAGNOSIS — C9290 Myeloid leukemia, unspecified, not having achieved remission: Secondary | ICD-10-CM | POA: Insufficient documentation

## 2023-09-25 DIAGNOSIS — Z83719 Family history of colon polyps, unspecified: Secondary | ICD-10-CM | POA: Insufficient documentation

## 2023-09-25 DIAGNOSIS — N400 Enlarged prostate without lower urinary tract symptoms: Secondary | ICD-10-CM | POA: Insufficient documentation

## 2023-09-25 DIAGNOSIS — I10 Essential (primary) hypertension: Secondary | ICD-10-CM | POA: Insufficient documentation

## 2023-09-25 DIAGNOSIS — M1990 Unspecified osteoarthritis, unspecified site: Secondary | ICD-10-CM | POA: Insufficient documentation

## 2023-09-25 DIAGNOSIS — Z8509 Personal history of malignant neoplasm of other digestive organs: Secondary | ICD-10-CM | POA: Insufficient documentation

## 2023-09-25 DIAGNOSIS — N289 Disorder of kidney and ureter, unspecified: Secondary | ICD-10-CM | POA: Insufficient documentation

## 2023-09-25 DIAGNOSIS — M779 Enthesopathy, unspecified: Secondary | ICD-10-CM | POA: Insufficient documentation

## 2023-09-25 DIAGNOSIS — E538 Deficiency of other specified B group vitamins: Secondary | ICD-10-CM | POA: Insufficient documentation

## 2023-09-25 DIAGNOSIS — J441 Chronic obstructive pulmonary disease with (acute) exacerbation: Secondary | ICD-10-CM | POA: Insufficient documentation

## 2023-09-25 DIAGNOSIS — C3490 Malignant neoplasm of unspecified part of unspecified bronchus or lung: Secondary | ICD-10-CM | POA: Insufficient documentation

## 2023-09-25 DIAGNOSIS — E1169 Type 2 diabetes mellitus with other specified complication: Secondary | ICD-10-CM | POA: Insufficient documentation

## 2023-09-25 DIAGNOSIS — I2510 Atherosclerotic heart disease of native coronary artery without angina pectoris: Secondary | ICD-10-CM | POA: Insufficient documentation

## 2023-09-25 NOTE — Progress Notes (Signed)
 [facility] Clinical Social Work  Clinical Social Work was referred by nurse for assistance with Medication. Clinical Social Worker contacted patient by phone to offer support and assess for needs.  CSW reached out to Pharmacist C. Medlin who confirmed patient is already receiving financial assistance, that lowered copay to $250. CSW referred patient to LLS to inquire about assistance for medication.   Lizbeth Sprague, LCSW  Clinical Social Worker [facility]

## 2023-09-26 ENCOUNTER — Ambulatory Visit: Payer: BC Managed Care – PPO | Attending: Cardiology | Admitting: Cardiology

## 2023-09-26 ENCOUNTER — Encounter: Payer: Self-pay | Admitting: Cardiology

## 2023-09-26 VITALS — BP 128/82 | HR 104 | Ht 69.0 in | Wt 208.0 lb

## 2023-09-26 DIAGNOSIS — I509 Heart failure, unspecified: Secondary | ICD-10-CM

## 2023-09-26 DIAGNOSIS — J42 Unspecified chronic bronchitis: Secondary | ICD-10-CM | POA: Diagnosis not present

## 2023-09-26 DIAGNOSIS — I5032 Chronic diastolic (congestive) heart failure: Secondary | ICD-10-CM | POA: Diagnosis not present

## 2023-09-26 DIAGNOSIS — I2510 Atherosclerotic heart disease of native coronary artery without angina pectoris: Secondary | ICD-10-CM

## 2023-09-26 DIAGNOSIS — C221 Intrahepatic bile duct carcinoma: Secondary | ICD-10-CM

## 2023-09-26 DIAGNOSIS — C3492 Malignant neoplasm of unspecified part of left bronchus or lung: Secondary | ICD-10-CM | POA: Diagnosis not present

## 2023-09-26 HISTORY — DX: Heart failure, unspecified: I50.9

## 2023-09-26 NOTE — Patient Instructions (Addendum)
 Medication Instructions:  Your physician recommends that you continue on your current medications as directed. Please refer to the Current Medication list given to you today.  *If you need a refill on your cardiac medications before your next appointment, please call your pharmacy*   Lab Work: Your physician recommends that you return for lab work in: next week You need to have labs done when you are fasting.  You can come Monday through Friday 8:30 am to 12:00 pm and 1:15 to 4:30. You do not need to make an appointment as the order has already been placed. The labs you are going to have done are BMET.    Testing/Procedures: None Ordered   Follow-Up: At [facility], you and your health needs are our priority.  As part of our continuing mission to provide you with exceptional heart care, we have created designated Provider Care Teams.  These Care Teams include your primary Cardiologist (physician) and Advanced Practice Providers (APPs -  Physician Assistants and Nurse Practitioners) who all work together to provide you with the care you need, when you need it.  We recommend signing up for the patient portal called "MyChart".  Sign up information is provided on this After Visit Summary.  MyChart is used to connect with patients for Virtual Visits (Telemedicine).  Patients are able to view lab/test results, encounter notes, upcoming appointments, etc.  Non-urgent messages can be sent to your provider as well.   To learn more about what you can do with MyChart, go to [email or web address].    Your next appointment:   3 month(s)  The format for your next appointment:   In Person  Provider:   Gypsy Balsam, MD    Other Instructions NA

## 2023-09-26 NOTE — Progress Notes (Signed)
 Cardiology Office Note:    Date:  09/26/2023   ID:  Fredonia Fish, DOB September 28, 1950, MRN [id number]  PCP:  Nestor Elston NOVAK, NP  Cardiologist:  Lamar Fitch, MD    Referring MD: Nestor Elston NOVAK, NP   No chief complaint on file.   History of Present Illness:    Kyle Montoya is a 73 y.o. male past medical history significant for lung cancer, also cholangiocarcinoma, chronic myelocytic leukemia, he is a chronic smoker he was referred to us  originally because of calcification noted on CT.  Stress test has been done which was negative however recently he ended up being in a hospital because of shortness of breath as well as swollen legs.  He also had pleural effusion.  Echocardiogram done in the hospital showed preserved ejection fraction, there was enlargement of the right ventricle suspicion for pulmonary hypertension, however, echocardiogram was not sufficient to clarify that.  He did have thoracocentesis done.  Comes today to months for follow-up since the time of discharge from hospital which was about 2 weeks ago he end up going back to the hospital swollen legs.  Apparently he was not taking medication which showed he did not take appropriate dose of Lasix .  Dose of Lasix  has been increased and he just started taking this yesterday.  He is urinating a lot but his legs are still somewhat swollen.  He said he does not have any shortness of breath no chest pain tightness squeezing pressure burning chest  Past Medical History:  Diagnosis Date   Abnormal colonoscopy 02/23/2021   Abnormal transaminases 02/27/2022   Adenomatous polyp of descending colon 02/23/2021   Adverse drug reaction, initial encounter 07/04/2016   07/02/2016: Prevnar 23 vaccine given in right arm  07/02/2016: localized swelling  10/11/20167: Increase in swelling from shoulder to inner aspect of elbow.Red, warm and firm to touch.  No lip or airway swelling or stridor.  2+ brachial and radial pulses right arm.  Capillary  refill < 2 seconds     Anemia in chronic illness 01/22/2013   Arthritis, degenerative 01/31/2015   Ascending aortic aneurysm (HCC) 06/27/2022   Benign fibroma of prostate 01/31/2015   BP (high blood pressure) 01/22/2013   BPH (benign prostatic hyperplasia)    CAD (coronary artery disease)    Cannot sleep 01/31/2015   Carcinoma of biliary tract (HCC) 02/15/2013   Diagnosed in 2014, Pt had whipple done at [facility], under care by Dr. Abran Kallman Oncologist, has f/u every 6-12 months.   Cataract    bilateral sx   Cervical osteoarthritis 10/18/2014   Cholangiocarcinoma of biliary tract (HCC)    Chronic cough 01/31/2015   Chronic myelogenous leukemia (HCC)    Chronic pain 01/31/2015   CML (chronic myelocytic leukemia) (HCC) 10/18/2014   Overview:   Follows with [facility], Dr. Cornelius. On dasatinib , last WBC 9.6.     Compulsive tobacco user syndrome 01/31/2015   COPD (chronic obstructive pulmonary disease) (HCC)    uses inhaler   COPD with asthma (HCC) 01/22/2013   Severe obstruction on spirometry FeV1 54% FeV1/FVC 51% 01/31/2015  CXR NAD 01/25/15     Diuretic-induced hypokalemia 05/23/2023   DJD (degenerative joint disease)    Dyslipidemia    Family history of colonic polyps    Family history of prostate cancer    Full thickness rotator cuff tear 08/08/2020   Gout    High coronary artery calcium score 05/30/2023   History of colon polyps 02/23/2021   History of  colonic polyps 10/01/2021   IMO SNOMED Dx Update Oct 2024     HLD (hyperlipidemia) 01/31/2015   HTN (hypertension)    on meds   Hypercalcemia 08/10/2020   Hypercholesterolemia    Hypocalcemia 08/27/2021   Hyponatremia 09/19/2023   Impingement syndrome of both shoulders 03/31/2020   Lung cancer (HCC)    Myelogenous leukemia (HCC)    Non-small cell carcinoma of lung (HCC) 01/31/2015   02/13/2015 CT Chest no evidence of recurrence in lung cancer  Stage I adenocarcinoma diagnosed with left upper lobectomy in  February 2016     OA (osteoarthritis)    PAD (peripheral artery disease) (HCC)    Personal history of gastric ulcers 02/23/2021   Renal insufficiency    Shoulder pain, left 03/29/2020   Shoulder pain, right 03/29/2020   Tendonitis    patellar   Thoracic aortic aneurysm (HCC) 06/27/2022   Wheezing 01/31/2015    Past Surgical History:  Procedure Laterality Date   BIOPSY  04/30/2021   Procedure: BIOPSY;  Surgeon: Wilhelmenia Aloha Raddle., MD;  Location: [facility];  Service: Gastroenterology;;   Bowel duct surg  2014   Bowel cancer   CARDIAC CATHETERIZATION  2000   CHOLECYSTECTOMY     COLONOSCOPY  06/23/2015   mild sigmoid diverticulosis. small external and internal hemorrhoids. otherwise normal colonoscopy to terminal ileum   COLONOSCOPY WITH PROPOFOL  N/A 04/30/2021   Procedure: COLONOSCOPY WITH PROPOFOL ;  Surgeon: Wilhelmenia Aloha Raddle., MD;  Location: [facility];  Service: Gastroenterology;  Laterality: N/A;   ENDOSCOPIC MUCOSAL RESECTION N/A 04/30/2021   Procedure: ENDOSCOPIC MUCOSAL RESECTION;  Surgeon: Wilhelmenia Aloha Raddle., MD;  Location: [facility];  Service: Gastroenterology;  Laterality: N/A;   ESOPHAGOGASTRODUODENOSCOPY (EGD) WITH PROPOFOL  N/A 04/30/2021   Procedure: ESOPHAGOGASTRODUODENOSCOPY (EGD) WITH PROPOFOL ;  Surgeon: Wilhelmenia Aloha Raddle., MD;  Location: [facility];  Service: Gastroenterology;  Laterality: N/A;   HEMOSTASIS CLIP PLACEMENT  04/30/2021   Procedure: HEMOSTASIS CLIP PLACEMENT;  Surgeon: Wilhelmenia Aloha Raddle., MD;  Location: [facility];  Service: Gastroenterology;;   LUNG CANCER SURGERY  09/2014   partial lobectomy   POLYPECTOMY  04/30/2021   Procedure: POLYPECTOMY;  Surgeon: Mansouraty, Aloha Raddle., MD;  Location: [facility];  Service: Gastroenterology;;   SHOULDER OPEN ROTATOR CUFF REPAIR Right 09/27/2020   Procedure: Right shoulder mini open rotator cuff repair, distal clavicle resection;  Surgeon: Duwayne Purchase, MD;  Location: [facility];  Service: Orthopedics;  Laterality: Right;  90 mins  Choice with Block anesthesia   SUBMUCOSAL LIFTING INJECTION  04/30/2021   Procedure: SUBMUCOSAL LIFTING INJECTION;  Surgeon: Wilhelmenia Aloha Raddle., MD;  Location: [facility];  Service: Gastroenterology;;   UPPER GASTROINTESTINAL ENDOSCOPY     WHIPPLE PROCEDURE  02/25/2013   Procedure: WHIPPLE PROCEDURE; Surgeon: Abran Kallman, MD; Location[facility]; Service: General; Laterality: N/A;    Current Medications: Current Meds  Medication Sig   albuterol  (PROVENTIL  HFA;VENTOLIN  HFA) 108 (90 BASE) MCG/ACT inhaler Inhale 2 puffs into the lungs every 6 (six) hours as needed for wheezing or shortness of breath.   allopurinol  (ZYLOPRIM ) 300 MG tablet Take 300 mg by mouth daily.   aspirin  EC 81 MG tablet Take 1 tablet (81 mg total) by mouth daily. Swallow whole.   Calcium Carbonate (CALCIUM 500 PO) Take 1 tablet by mouth 3 (three) times daily.   Cholecalciferol (VITAMIN D ) 50 MCG (2000 UT) tablet Take 2,000 Units by mouth daily.   diclofenac Sodium (VOLTAREN) 1 % GEL Apply 2 g topically at bedtime.   docusate sodium  (  COLACE) 100 MG capsule Take 200 mg by mouth daily.   fluticasone  (FLONASE ) 50 MCG/ACT nasal spray Place 2 sprays into both nostrils daily.   furosemide  (LASIX ) 80 MG tablet Take 80 mg by mouth 2 (two) times daily.   HYDROcodone -acetaminophen  (NORCO) 7.5-325 MG tablet Take 1 tablet by mouth every 6 (six) hours as needed for moderate pain.   irbesartan -hydrochlorothiazide  (AVALIDE) 150-12.5 MG tablet Take 1 tablet by mouth daily.   methocarbamol  (ROBAXIN ) 750 MG tablet Take 750 mg by mouth every 6 (six) hours as needed for muscle spasms.   omeprazole  (PRILOSEC) 40 MG capsule TAKE 1 CAPSULE (40 MG TOTAL) BY MOUTH 2 (TWO) TIMES DAILY BEFORE A MEAL.   potassium chloride  (KLOR-CON  M) 10 MEQ tablet Take 1 tablet (10 mEq total) by mouth daily.   SPRYCEL  100 MG tablet TAKE 1 TABLET BY MOUTH DAILY   tamsulosin  (FLOMAX ) 0.4 MG CAPS  capsule Take 0.8 mg by mouth daily.   TRELEGY ELLIPTA  100-62.5-25 MCG/INH AEPB Inhale 1 puff into the lungs daily at 6 (six) AM.   [DISCONTINUED] losartan -hydrochlorothiazide  (HYZAAR) 100-25 MG tablet Take 1 tablet by mouth daily.     Allergies:   Pneumococcal vaccines   Social History   Socioeconomic History   Marital status: Married    Spouse name: Not on file   Number of children: 2   Years of education: Not on file   Highest education level: Not on file  Occupational History   Occupation: environmental  Tobacco Use   Smoking status: Former    Current packs/day: 0.25    Average packs/day: 0.3 packs/day for 40.0 years (10.0 ttl pk-yrs)    Types: Cigarettes   Smokeless tobacco: Never  Vaping Use   Vaping status: Never Used  Substance and Sexual Activity   Alcohol use: Yes    Alcohol/week: 21.0 standard drinks of alcohol    Types: 21 Cans of beer per week   Drug use: Never   Sexual activity: Not on file  Other Topics Concern   Not on file  Social History Narrative   Married, 2 children.   Works moves heavy equipment x 30 yrs.   Social Drivers of Corporate Investment Banker Strain: Not on file  Food Insecurity: Not on file  Transportation Needs: Not on file  Physical Activity: Not on file  Stress: Not on file  Social Connections: Not on file     Family History: The patient's family history includes Asthma in his sister; Cancer in his half-sister and paternal grandfather; Hypertension in his brother, mother, and sister; Prostate cancer (age of onset: 67) in his father. There is no history of Esophageal cancer, Colon polyps, Colon cancer, Stomach cancer, Rectal cancer, Inflammatory bowel disease, Liver disease, or Pancreatic cancer. ROS:   Please see the history of present illness.    All 14 point review of systems negative except as described per history of present illness  EKGs/Labs/Other Studies Reviewed:         Recent Labs: 12/13/2022: TSH 2.52 09/18/2023:  ALT 107; B Natriuretic Peptide 88.8; BUN 14; Creatinine 0.9; Hemoglobin 13.1; Magnesium  1.99; Platelets 406; Potassium 4.1; Sodium 126  Recent Lipid Panel    Component Value Date/Time   CHOL 165 12/13/2022 0000   TRIG 102 12/13/2022 0000   HDL 109 (A) 12/13/2022 0000   LDLCALC 36 12/13/2022 0000    Physical Exam:    VS:  BP 128/82   Pulse (!) 104   Ht 5' 9 (1.753 m)  Wt 208 lb (94.3 kg)   SpO2 95%   BMI 30.72 kg/m     Wt Readings from Last 3 Encounters:  09/26/23 208 lb (94.3 kg)  07/30/23 215 lb 8 oz (97.8 kg)  07/10/23 212 lb (96.2 kg)     GEN:  Well nourished, well developed in no acute distress HEENT: Normal NECK: No JVD; No carotid bruits LYMPHATICS: No lymphadenopathy CARDIAC: RRR, no murmurs, no rubs, no gallops RESPIRATORY:  Clear to auscultation without rales, wheezing or rhonchi  ABDOMEN: Soft, non-tender, non-distended MUSCULOSKELETAL:  No edema; No deformity  SKIN: Warm and dry LOWER EXTREMITIES: 2+ edema swelling NEUROLOGIC:  Alert and oriented x 3 PSYCHIATRIC:  Normal affect   ASSESSMENT:    1. Chronic diastolic congestive heart failure (HCC)   2. Coronary artery disease involving native coronary artery of native heart without angina pectoris   3. Non-small cell cancer of left lung (HCC)   4. Chronic bronchitis, unspecified chronic bronchitis type (HCC)   5. Cholangiocarcinoma of biliary tract (HCC)    PLAN:    In order of problems listed above:  Chronic diastolic congestive heart failure.  He was diuresed in the hospital.  He is back on diuretic 80 mg Lasix  started yesterday.  Will make arrangements for Chem-7 to be done next week as well as proBNP.  He might have some pulmonary hypertension secondary to his lung problems however echocardiogram is not sufficient to precisely assess that.  Will continue to manage him medically and based on that we decide what will be next course of action. Coronary artery disease.  Stress test negative.  The key  will be risk factors modifications.  He is taking aspirin  which I will continue. Non-smoker cell lung cancer with cholangiosarcoma pancreatic myelocytic leukemia managed like always excellently by our oncology team. Dyslipidemia I did review K PN which show me LDL 36 HDL 109.  Will continue present management   Medication Adjustments/Labs and Tests Ordered: Current medicines are reviewed at length with the patient today.  Concerns regarding medicines are outlined above.  No orders of the defined types were placed in this encounter.  Medication changes: No orders of the defined types were placed in this encounter.   Signed, Lamar DOROTHA Fitch, MD, [facility] 09/26/2023 8:59 AM     [facility]

## 2023-09-26 NOTE — Addendum Note (Signed)
 Addended by: Baldo Ash D on: 09/26/2023 09:12 AM   Modules accepted: Orders

## 2023-10-01 DIAGNOSIS — R609 Edema, unspecified: Secondary | ICD-10-CM | POA: Insufficient documentation

## 2023-10-02 ENCOUNTER — Telehealth: Payer: Self-pay | Admitting: Cardiology

## 2023-10-02 DIAGNOSIS — R531 Weakness: Secondary | ICD-10-CM | POA: Diagnosis not present

## 2023-10-02 DIAGNOSIS — R062 Wheezing: Secondary | ICD-10-CM | POA: Diagnosis not present

## 2023-10-02 DIAGNOSIS — J189 Pneumonia, unspecified organism: Secondary | ICD-10-CM | POA: Diagnosis not present

## 2023-10-02 DIAGNOSIS — R55 Syncope and collapse: Secondary | ICD-10-CM | POA: Diagnosis not present

## 2023-10-02 DIAGNOSIS — I5032 Chronic diastolic (congestive) heart failure: Secondary | ICD-10-CM | POA: Diagnosis not present

## 2023-10-02 DIAGNOSIS — R110 Nausea: Secondary | ICD-10-CM | POA: Diagnosis not present

## 2023-10-02 MED ORDER — FUROSEMIDE 80 MG PO TABS: 80.0000 mg | ORAL_TABLET | Freq: Two times a day (BID) | ORAL | 3 refills | Status: DC

## 2023-10-02 NOTE — Telephone Encounter (Signed)
 Spoke with [facility] nurse Lyla Son. She stated that pt needed refill of Lasix 80mg  daily. Per Dr. Bing Matter, ok to refill medications until labs come in.

## 2023-10-02 NOTE — Telephone Encounter (Signed)
 Pt c/o medication issue:  1. Name of Medication: furosemide  (LASIX ) 80 MG tablet   2. How are you currently taking this medication (dosage and times per day)?   3. Are you having a reaction (difficulty breathing--STAT)?   4. What is your medication issue? Patient is out of this medication currently and home health nurse is requesting this be called in as soon as possible due to pitted edema in legs.

## 2023-10-03 DIAGNOSIS — R609 Edema, unspecified: Secondary | ICD-10-CM | POA: Diagnosis not present

## 2023-10-03 LAB — BASIC METABOLIC PANEL
BUN/Creatinine Ratio: 22 (ref 10–24)
BUN: 15 mg/dL (ref 8–27)
CO2: 25 mmol/L (ref 20–29)
Calcium: 8.1 mg/dL — ABNORMAL LOW (ref 8.6–10.2)
Chloride: 91 mmol/L — ABNORMAL LOW (ref 96–106)
Creatinine, Ser: 0.67 mg/dL — ABNORMAL LOW (ref 0.76–1.27)
Glucose: 120 mg/dL — ABNORMAL HIGH (ref 70–99)
Potassium: 3.4 mmol/L — ABNORMAL LOW (ref 3.5–5.2)
Sodium: 133 mmol/L — ABNORMAL LOW (ref 134–144)
eGFR: 99 mL/min/{1.73_m2} (ref >59)

## 2023-10-06 ENCOUNTER — Other Ambulatory Visit (HOSPITAL_COMMUNITY): Payer: Self-pay

## 2023-10-08 ENCOUNTER — Emergency Department (HOSPITAL_COMMUNITY): Payer: BC Managed Care – PPO

## 2023-10-08 ENCOUNTER — Telehealth: Payer: Self-pay

## 2023-10-08 ENCOUNTER — Other Ambulatory Visit: Payer: Self-pay

## 2023-10-08 ENCOUNTER — Observation Stay (HOSPITAL_COMMUNITY)
Admission: EM | Admit: 2023-10-08 | Discharge: 2023-10-10 | Disposition: A | Discharge status: Home / Self Care | Payer: BC Managed Care – PPO | Attending: Internal Medicine | Admitting: Internal Medicine

## 2023-10-08 ENCOUNTER — Encounter (HOSPITAL_COMMUNITY): Payer: Self-pay

## 2023-10-08 DIAGNOSIS — I110 Hypertensive heart disease with heart failure: Secondary | ICD-10-CM | POA: Diagnosis not present

## 2023-10-08 DIAGNOSIS — F1090 Alcohol use, unspecified, uncomplicated: Secondary | ICD-10-CM | POA: Insufficient documentation

## 2023-10-08 DIAGNOSIS — Z79899 Other long term (current) drug therapy: Secondary | ICD-10-CM | POA: Diagnosis not present

## 2023-10-08 DIAGNOSIS — E876 Hypokalemia: Secondary | ICD-10-CM

## 2023-10-08 DIAGNOSIS — Z7901 Long term (current) use of anticoagulants: Secondary | ICD-10-CM | POA: Diagnosis not present

## 2023-10-08 DIAGNOSIS — I5031 Acute diastolic (congestive) heart failure: Secondary | ICD-10-CM | POA: Diagnosis not present

## 2023-10-08 DIAGNOSIS — I10 Essential (primary) hypertension: Secondary | ICD-10-CM

## 2023-10-08 DIAGNOSIS — J948 Other specified pleural conditions: Secondary | ICD-10-CM | POA: Insufficient documentation

## 2023-10-08 DIAGNOSIS — E669 Obesity, unspecified: Secondary | ICD-10-CM | POA: Diagnosis not present

## 2023-10-08 DIAGNOSIS — I951 Orthostatic hypotension: Secondary | ICD-10-CM

## 2023-10-08 DIAGNOSIS — Z6829 Body mass index (BMI) 29.0-29.9, adult: Secondary | ICD-10-CM | POA: Diagnosis not present

## 2023-10-08 DIAGNOSIS — R2240 Localized swelling, mass and lump, unspecified lower limb: Secondary | ICD-10-CM | POA: Diagnosis present

## 2023-10-08 DIAGNOSIS — I5023 Acute on chronic systolic (congestive) heart failure: Secondary | ICD-10-CM | POA: Diagnosis present

## 2023-10-08 DIAGNOSIS — Z87891 Personal history of nicotine dependence: Secondary | ICD-10-CM | POA: Insufficient documentation

## 2023-10-08 DIAGNOSIS — E871 Hypo-osmolality and hyponatremia: Secondary | ICD-10-CM | POA: Diagnosis not present

## 2023-10-08 DIAGNOSIS — J90 Pleural effusion, not elsewhere classified: Secondary | ICD-10-CM | POA: Insufficient documentation

## 2023-10-08 DIAGNOSIS — Z87438 Personal history of other diseases of male genital organs: Secondary | ICD-10-CM | POA: Diagnosis not present

## 2023-10-08 DIAGNOSIS — I509 Heart failure, unspecified: Principal | ICD-10-CM | POA: Diagnosis present

## 2023-10-08 DIAGNOSIS — I5033 Acute on chronic diastolic (congestive) heart failure: Secondary | ICD-10-CM | POA: Diagnosis present

## 2023-10-08 HISTORY — DX: Acute diastolic (congestive) heart failure: I50.31

## 2023-10-08 LAB — CBC WITH DIFFERENTIAL/PLATELET
Abs Immature Granulocytes: 0.01 10*3/uL (ref 0.00–0.07)
Basophils Absolute: 0 10*3/uL (ref 0.0–0.1)
Basophils Relative: 1 %
Eosinophils Absolute: 0 10*3/uL (ref 0.0–0.5)
Eosinophils Relative: 1 %
HCT: 37 % — ABNORMAL LOW (ref 39.0–52.0)
Hemoglobin: 13 g/dL (ref 13.0–17.0)
Immature Granulocytes: 0 %
Lymphocytes Relative: 37 %
Lymphs Abs: 1.4 10*3/uL (ref 0.7–4.0)
MCH: 37.5 pg — ABNORMAL HIGH (ref 26.0–34.0)
MCHC: 35.1 g/dL (ref 30.0–36.0)
MCV: 106.6 fL — ABNORMAL HIGH (ref 80.0–100.0)
Monocytes Absolute: 0.7 10*3/uL (ref 0.1–1.0)
Monocytes Relative: 20 %
Neutro Abs: 1.6 10*3/uL — ABNORMAL LOW (ref 1.7–7.7)
Neutrophils Relative %: 41 %
Platelets: 155 10*3/uL (ref 150–400)
RBC: 3.47 MIL/uL — ABNORMAL LOW (ref 4.22–5.81)
RDW: 14.7 % (ref 11.5–15.5)
WBC: 3.8 10*3/uL — ABNORMAL LOW (ref 4.0–10.5)
nRBC: 0 % (ref 0.0–0.2)

## 2023-10-08 LAB — BRAIN NATRIURETIC PEPTIDE: B Natriuretic Peptide: 95 pg/mL (ref 0.0–100.0)

## 2023-10-08 LAB — COMPREHENSIVE METABOLIC PANEL
ALT: 42 U/L (ref 0–44)
AST: 99 U/L — ABNORMAL HIGH (ref 15–41)
Albumin: 3.2 g/dL — ABNORMAL LOW (ref 3.5–5.0)
Alkaline Phosphatase: 101 U/L (ref 38–126)
Anion gap: 13 (ref 5–15)
BUN: 23 mg/dL (ref 8–23)
CO2: 22 mmol/L (ref 22–32)
Calcium: 8.2 mg/dL — ABNORMAL LOW (ref 8.9–10.3)
Chloride: 94 mmol/L — ABNORMAL LOW (ref 98–111)
Creatinine, Ser: 0.8 mg/dL (ref 0.61–1.24)
GFR, Estimated: >60 mL/min (ref >60)
Glucose, Bld: 103 mg/dL — ABNORMAL HIGH (ref 70–99)
Potassium: 3.5 mmol/L (ref 3.5–5.1)
Sodium: 129 mmol/L — ABNORMAL LOW (ref 135–145)
Total Bilirubin: 0.9 mg/dL (ref 0.0–1.2)
Total Protein: 6.9 g/dL (ref 6.5–8.1)

## 2023-10-08 LAB — POTASSIUM: Potassium: 4.1 mmol/L (ref 3.5–5.1)

## 2023-10-08 LAB — TROPONIN I (HIGH SENSITIVITY): Troponin I (High Sensitivity): 20 ng/L — ABNORMAL HIGH (ref <18)

## 2023-10-08 MED ORDER — ASPIRIN 81 MG PO TBEC
81.0000 mg | DELAYED_RELEASE_TABLET | Freq: Every day | ORAL | Status: DC
  Administered 2023-10-09 – 2023-10-10 (×2): 81 mg via ORAL
  Filled 2023-10-08 (×2): qty 1

## 2023-10-08 MED ORDER — IRBESARTAN-HYDROCHLOROTHIAZIDE 150-12.5 MG PO TABS: 1.0000 | ORAL_TABLET | Freq: Every day | ORAL | Status: DC

## 2023-10-08 MED ORDER — ENOXAPARIN SODIUM 40 MG/0.4ML IJ SOSY
40.0000 mg | PREFILLED_SYRINGE | INTRAMUSCULAR | Status: DC
  Administered 2023-10-08 – 2023-10-09 (×2): 40 mg via SUBCUTANEOUS
  Filled 2023-10-08 (×2): qty 0.4

## 2023-10-08 MED ORDER — FLUTICASONE FUROATE-VILANTEROL 100-25 MCG/ACT IN AEPB
1.0000 | INHALATION_SPRAY | Freq: Every day | RESPIRATORY_TRACT | Status: DC
Start: 2023-10-09 — End: 2023-10-10
  Administered 2023-10-09 – 2023-10-10 (×2): 1 via RESPIRATORY_TRACT
  Filled 2023-10-08: qty 28

## 2023-10-08 MED ORDER — ACETAMINOPHEN 325 MG PO TABS: 650.0000 mg | ORAL_TABLET | Freq: Four times a day (QID) | ORAL | Status: DC | PRN

## 2023-10-08 MED ORDER — FUROSEMIDE 10 MG/ML IJ SOLN
60.0000 mg | Freq: Once | INTRAMUSCULAR | Status: AC
  Administered 2023-10-08: 60 mg via INTRAVENOUS
  Filled 2023-10-08: qty 8

## 2023-10-08 MED ORDER — FLUTICASONE PROPIONATE 50 MCG/ACT NA SUSP
2.0000 | Freq: Every day | NASAL | Status: DC
  Administered 2023-10-09 – 2023-10-10 (×2): 2 via NASAL
  Filled 2023-10-08: qty 16

## 2023-10-08 MED ORDER — ONDANSETRON HCL 4 MG PO TABS: 4.0000 mg | ORAL_TABLET | Freq: Four times a day (QID) | ORAL | Status: DC | PRN

## 2023-10-08 MED ORDER — IOHEXOL 350 MG/ML SOLN
75.0000 mL | Freq: Once | INTRAVENOUS | Status: AC | PRN
  Administered 2023-10-08: 75 mL via INTRAVENOUS

## 2023-10-08 MED ORDER — ONDANSETRON HCL 4 MG/2ML IJ SOLN
4.0000 mg | Freq: Four times a day (QID) | INTRAMUSCULAR | Status: DC | PRN
Start: 2023-10-08 — End: 2023-10-10

## 2023-10-08 MED ORDER — TRAZODONE HCL 50 MG PO TABS
25.0000 mg | ORAL_TABLET | Freq: Every evening | ORAL | Status: DC | PRN
  Administered 2023-10-08 – 2023-10-09 (×2): 25 mg via ORAL
  Filled 2023-10-08 (×2): qty 1

## 2023-10-08 MED ORDER — UMECLIDINIUM BROMIDE 62.5 MCG/ACT IN AEPB
1.0000 | INHALATION_SPRAY | Freq: Every day | RESPIRATORY_TRACT | Status: DC
Start: 2023-10-09 — End: 2023-10-10
  Administered 2023-10-09 – 2023-10-10 (×2): 1 via RESPIRATORY_TRACT
  Filled 2023-10-08: qty 7

## 2023-10-08 MED ORDER — ACETAMINOPHEN 650 MG RE SUPP: 650.0000 mg | Freq: Four times a day (QID) | RECTAL | Status: DC | PRN

## 2023-10-08 MED ORDER — TAMSULOSIN HCL 0.4 MG PO CAPS
0.8000 mg | ORAL_CAPSULE | Freq: Every day | ORAL | Status: DC
Start: 2023-10-09 — End: 2023-10-10
  Administered 2023-10-09 – 2023-10-10 (×2): 0.8 mg via ORAL
  Filled 2023-10-08 (×2): qty 2

## 2023-10-08 MED ORDER — IRBESARTAN 150 MG PO TABS
150.0000 mg | ORAL_TABLET | Freq: Every day | ORAL | Status: DC
  Filled 2023-10-08: qty 1

## 2023-10-08 MED ORDER — SODIUM CHLORIDE (PF) 0.9 % IJ SOLN
INTRAMUSCULAR | Status: AC
  Filled 2023-10-08: qty 50

## 2023-10-08 MED ORDER — ALBUTEROL SULFATE (2.5 MG/3ML) 0.083% IN NEBU
2.5000 mg | INHALATION_SOLUTION | RESPIRATORY_TRACT | Status: DC | PRN
  Administered 2023-10-08 – 2023-10-09 (×2): 2.5 mg via RESPIRATORY_TRACT
  Filled 2023-10-08 (×3): qty 3

## 2023-10-08 MED ORDER — POTASSIUM CHLORIDE CRYS ER 10 MEQ PO TBCR: 20.0000 meq | EXTENDED_RELEASE_TABLET | Freq: Every day | ORAL | 5 refills | Status: DC

## 2023-10-08 MED ORDER — PANTOPRAZOLE SODIUM 40 MG PO TBEC
40.0000 mg | DELAYED_RELEASE_TABLET | Freq: Every day | ORAL | Status: DC
Start: 2023-10-08 — End: 2023-10-10
  Administered 2023-10-08 – 2023-10-10 (×3): 40 mg via ORAL
  Filled 2023-10-08 (×3): qty 1

## 2023-10-08 MED ORDER — POTASSIUM CHLORIDE CRYS ER 20 MEQ PO TBCR
20.0000 meq | EXTENDED_RELEASE_TABLET | Freq: Every day | ORAL | Status: DC
Start: 2023-10-08 — End: 2023-10-09
  Administered 2023-10-08: 20 meq via ORAL
  Filled 2023-10-08: qty 1

## 2023-10-08 MED ORDER — METHOCARBAMOL 500 MG PO TABS
750.0000 mg | ORAL_TABLET | Freq: Four times a day (QID) | ORAL | Status: DC | PRN
  Administered 2023-10-09 – 2023-10-10 (×2): 750 mg via ORAL
  Filled 2023-10-08 (×2): qty 2

## 2023-10-08 MED ORDER — FUROSEMIDE 10 MG/ML IJ SOLN
60.0000 mg | Freq: Two times a day (BID) | INTRAMUSCULAR | Status: DC
  Administered 2023-10-09: 60 mg via INTRAVENOUS
  Filled 2023-10-08: qty 8

## 2023-10-08 NOTE — ED Provider Notes (Signed)
 [place] [facility] AT [facility] Provider Note   CSN: [id number] Arrival date & time: 10/08/23  0272     History  Chief Complaint  Patient presents with   Leg Swelling    Kyle Montoya is a 73 y.o. male.  Pt is a 73 y.o. male pmh for lung cancer, also cholangiocarcinoma status post [place], chronic myelocytic leukemia, he is a chronic smoker and recent diagnosis of diastolic CHF currently on 80 mg of Lasix  daily who is presenting with persistent lower extremity swelling and shortness of breath.  Patient was at [facility] last month where he was diuresed and said that the swelling had improved but since he left the hospital it is only gotten worse again.  He was on 40 mg of Lasix  and they increased him to 80 mg of Lasix  approximately 2 to 3 weeks ago.  He has been compliant with this and reports that he urinates a lot but the swelling in his legs and mid abdomen are only getting worse.  He becomes winded with walking more than 25 feet and has to sit down and rest.  He also complains of some orthopnea over the last 2 months.  He has a history of asthma and reports that he coughs and has wheezing intermittently but that does improve with his inhaler.  He has not had any other change in his medications.  His Avalide he has been on for a long time and that never caused swelling in his legs in the past.  He has not had productive cough or fever.  No resting shortness of breath.  He denies any significant leg pain.  He reports he has never had a history of blood clots and has not been on any anticoagulation.  He does report that recently his blood pressure has been running a lot lower and he is even not taking his medications at time because it will drop his blood pressure too low.  He denies any significant abdominal pain nausea vomiting or change in his stools.  He has been following up with his cardiologist as an outpatient and reports he was supposed to see his PCP  today but because symptoms were worsening he knew they tell him he needed to go to the hospital and he did not want to go to [place] again so came here.  The history is provided by the patient.       Home Medications Prior to Admission medications   Medication Sig Start Date End Date Taking? Authorizing Provider  albuterol  (PROVENTIL  HFA;VENTOLIN  HFA) 108 (90 BASE) MCG/ACT inhaler Inhale 2 puffs into the lungs every 6 (six) hours as needed for wheezing or shortness of breath.    [provider]  allopurinol  (ZYLOPRIM ) 300 MG tablet Take 300 mg by mouth daily.    [provider]  aspirin  EC 81 MG tablet Take 1 tablet (81 mg total) by mouth daily. Swallow whole. 05/30/23   Krasowski, Robert J, MD  Calcium Carbonate (CALCIUM 500 PO) Take 1 tablet by mouth 3 (three) times daily. 02/27/22   Mosher, Kelli A, PA-C  Cholecalciferol (VITAMIN D ) 50 MCG (2000 UT) tablet Take 2,000 Units by mouth daily.    [provider]  diclofenac Sodium (VOLTAREN) 1 % GEL Apply 2 g topically at bedtime. 07/10/20   [provider]  docusate sodium  (COLACE) 100 MG capsule Take 200 mg by mouth daily.    [provider]  fluticasone  (FLONASE ) 50 MCG/ACT nasal spray Place  2 sprays into both nostrils daily.    [provider]  furosemide  (LASIX ) 80 MG tablet Take 1 tablet (80 mg total) by mouth 2 (two) times daily. 10/02/23   Krasowski, Robert J, MD  HYDROcodone -acetaminophen  (NORCO) 7.5-325 MG tablet Take 1 tablet by mouth every 6 (six) hours as needed for moderate pain.    [provider]  irbesartan -hydrochlorothiazide  (AVALIDE) 150-12.5 MG tablet Take 1 tablet by mouth daily. 09/16/23   [provider]  methocarbamol  (ROBAXIN ) 750 MG tablet Take 750 mg by mouth every 6 (six) hours as needed for muscle spasms. 04/07/23   [provider]  mirtazapine  (REMERON ) 15 MG tablet Take 1 tablet (15 mg total) by mouth at bedtime. Patient not taking:  Reported on 09/26/2023 04/17/23   Nolia Baumgartner, MD  omeprazole  (PRILOSEC) 40 MG capsule TAKE 1 CAPSULE (40 MG TOTAL) BY MOUTH 2 (TWO) TIMES DAILY BEFORE A MEAL. 01/07/23   Edmonia Gottron, PA-C  potassium chloride  (KLOR-CON  M) 10 MEQ tablet Take 2 tablets (20 mEq total) by mouth daily. 10/08/23   Krasowski, Robert J, MD  SPRYCEL  100 MG tablet TAKE 1 TABLET BY MOUTH DAILY 06/04/23   Nolia Baumgartner, MD  tamsulosin  (FLOMAX ) 0.4 MG CAPS capsule Take 0.8 mg by mouth daily. 09/08/23   [provider]  TRELEGY ELLIPTA 100-62.5-25 MCG/INH AEPB Inhale 1 puff into the lungs daily at 6 (six) AM. 12/20/20   [provider]      Allergies    Pneumococcal vaccines    Review of Systems   Review of Systems  Physical Exam Updated Vital Signs BP (!) 134/93   Pulse 89   Temp 98.7 F (37.1 C)   Resp 16   Ht 5\' 9"  (1.753 m)   Wt 97.5 kg   SpO2 98%   BMI 31.75 kg/m  Physical Exam Vitals and nursing note reviewed.  Constitutional:      General: He is not in acute distress.    Appearance: He is well-developed.  HENT:     Head: Normocephalic and atraumatic.  Eyes:     Conjunctiva/sclera: Conjunctivae normal.     Pupils: Pupils are equal, round, and reactive to light.  Cardiovascular:     Rate and Rhythm: Regular rhythm. Tachycardia present.     Heart sounds: Murmur heard.  Pulmonary:     Effort: Pulmonary effort is normal. No respiratory distress.     Breath sounds: Normal breath sounds. No wheezing or rales.  Abdominal:     General: There is no distension.     Palpations: Abdomen is soft.     Tenderness: There is no abdominal tenderness. There is no guarding or rebound.     Comments: Well-healed midline abdominal scars.  Healing ecchymosis over the lower abdomen which she reports was from getting injections while hospitalized.  Musculoskeletal:        General: No tenderness. Normal range of motion.     Cervical back: Normal range of motion and neck supple.      Right lower leg: Edema present.     Left lower leg: Edema present.     Comments: 3+ pitting edema in bilateral lower extremities up to the knees.  Capillary refill is less than 3 seconds.  Legs are warm bilaterally.  No erythema  Skin:    General: Skin is warm and dry.     Findings: No erythema or rash.  Neurological:     Mental Status: He is alert and oriented to person,  place, and time. Mental status is at baseline.  Psychiatric:        Behavior: Behavior normal.     ED Results / Procedures / Treatments   Labs (all labs ordered are listed, but only abnormal results are displayed) Labs Reviewed  COMPREHENSIVE METABOLIC PANEL - Abnormal; Notable for the following components:      Result Value   Sodium 129 (*)    Chloride 94 (*)    Glucose, Bld 103 (*)    Calcium 8.2 (*)    Albumin  3.2 (*)    AST 99 (*)    All other components within normal limits  CBC WITH DIFFERENTIAL/PLATELET - Abnormal; Notable for the following components:   WBC 3.8 (*)    RBC 3.47 (*)    HCT 37.0 (*)    MCV 106.6 (*)    MCH 37.5 (*)    Neutro Abs 1.6 (*)    All other components within normal limits  TROPONIN I (HIGH SENSITIVITY) - Abnormal; Notable for the following components:   Troponin I (High Sensitivity) 20 (*)    All other components within normal limits  BRAIN NATRIURETIC PEPTIDE  POTASSIUM    EKG EKG Interpretation Date/Time:  Wednesday October 08 2023 10:59:43 EST Ventricular Rate:  93 PR Interval:  190 QRS Duration:  170 QT Interval:  399 QTC Calculation: 497 R Axis:   134  Text Interpretation: Sinus rhythm Multiple ventricular premature complexes RBBB and LPFB Baseline wander in lead(s) I No significant change since last tracing Confirmed by Almond Army ([id number]) on 10/08/2023 11:36:28 AM  Radiology CT Angio Chest PE W and/or Wo Contrast Result Date: 10/08/2023 CLINICAL DATA:  Pulmonary embolism (PE) suspected, high prob EXAM: CT ANGIOGRAPHY CHEST WITH CONTRAST TECHNIQUE:  Multidetector CT imaging of the chest was performed using the standard protocol during bolus administration of intravenous contrast. Multiplanar CT image reconstructions and MIPs were obtained to evaluate the vascular anatomy. RADIATION DOSE REDUCTION: This exam was performed according to the departmental dose-optimization program which includes automated exposure control, adjustment of the mA and/or kV according to patient size and/or use of iterative reconstruction technique. CONTRAST:  75mL OMNIPAQUE  IOHEXOL  350 MG/ML SOLN COMPARISON:  CT 09/09/2023 FINDINGS: Cardiovascular: Satisfactory opacification of the pulmonary arteries to the segmental level. No evidence of pulmonary embolism. Aortic and coronary artery atherosclerosis. Thoracic aorta is nonaneurysmal. Normal heart size. No pericardial effusion. Mediastinum/Nodes: No enlarged mediastinal, hilar, or axillary lymph nodes. Thyroid gland, trachea, and esophagus demonstrate no significant findings. Lungs/Pleura: Moderate-sized layering right-sided pleural effusion with mild compressive atelectasis. Postsurgical changes from left upper lobectomy. Previously seen left-sided effusion has resolved. No new airspace consolidation. No pneumothorax. Mild emphysema. Upper Abdomen: Hepatic steatosis.  No acute findings. Musculoskeletal: No chest wall abnormality. No acute or significant osseous findings. Review of the MIP images confirms the above findings. IMPRESSION: 1. No evidence of pulmonary embolism. 2. Moderate-sized layering right-sided pleural effusion with mild compressive atelectasis. 3. Previously seen left-sided effusion has resolved. 4. Hepatic steatosis. 5. Aortic and coronary artery atherosclerosis (ICD10-I70.0). 6. Emphysema (ICD10-J43.9). Electronically Signed   By: Leverne Reading D.O.   On: 10/08/2023 13:17   DG Chest 2 View Result Date: 10/08/2023 CLINICAL DATA:  Fluid retention.  Leg swelling. EXAM: CHEST - 2 VIEW COMPARISON:  10/02/2023.  FINDINGS: Redemonstration of small layering right pleural effusion with associated probable atelectatic changes at the right lung base. No significant interval change. No pulmonary edema. Bilateral lung fields are otherwise clear. Left lateral costophrenic angle is clear. Normal  cardio-mediastinal silhouette. No acute osseous abnormalities. The soft tissues are within normal limits. IMPRESSION: *Stable right pleural effusion with associated probable atelectasis. No pulmonary edema. Electronically Signed   By: Beula Brunswick M.D.   On: 10/08/2023 07:51    Procedures Procedures    Medications Ordered in ED Medications  furosemide  (LASIX ) injection 60 mg (has no administration in time range)  iohexol  (OMNIPAQUE ) 350 MG/ML injection 75 mL (75 mLs Intravenous Contrast Given 10/08/23 1109)    ED Course/ Medical Decision Making/ A&P                                 Medical Decision Making Amount and/or Complexity of Data Reviewed External Data Reviewed: notes. Labs: ordered. Decision-making details documented in ED Course. Radiology: ordered and independent interpretation performed. Decision-making details documented in ED Course. ECG/medicine tests: ordered and independent interpretation performed. Decision-making details documented in ED Course.  Risk Prescription drug management. Decision regarding hospitalization.   Pt with multiple medical problems and comorbidities and presenting today with a complaint that caries a high risk for morbidity and mortality.  Here today with the above complaints.  Persistent orthopnea, DOE, lower extremity edema despite now taking 80 mg of Lasix .  Patient also now with borderline blood pressures in the 90s and low 100s.  He is also persistently tachycardic.  He does not take any beta-blockers.  With his history of cancer, recent hospitalization and above findings concern for possible PE/DVTs as he reports that has not been evaluated versus other lung pathology.   Also concern for persistent diastolic CHF that is not responsive to Lasix  as he is now on 80 mg at home and symptoms are worsening.  May need other therapies.  Given his persistent symptoms that are progressively worsening and not improving despite multiple med changes as an outpatient if CTA is negative for PE will most likely need admission for diuresis.  I independently interpreted patient's labs and EKG.  EKG today showed a sinus tachycardia without acute ST changes.  BNP today is normal, CMP with hyponatremia of 129, potassium 3.5 but it is hemolyzed and normal creatinine.  Total bilirubin and anion gap are normal., I have independently visualized and interpreted pt's images today.  Chest x-ray with persistent right pleural effusion but no other acute findings.  No signs of cardiomegaly or significant pulmonary edema.  1:39 PM CTA neg for pe but moderate side pleural effusionand due to persisent worsening swelling in legs despite no response to increased outpt lasix  will admit for diuresis and possible change of diuretic at home.  Discussed with pt who is comfortable with this plan  CRITICAL CARE Performed by: [facility] [place] Total critical care time: 30 minutes Critical care time was exclusive of separately billable procedures and treating other patients. Critical care was necessary to treat or prevent imminent or life-threatening deterioration. Critical care was time spent personally by me on the following activities: development of treatment plan with patient and/or surrogate as well as nursing, discussions with consultants, evaluation of patient's response to treatment, examination of patient, obtaining history from patient or surrogate, ordering and performing treatments and interventions, ordering and review of laboratory studies, ordering and review of radiographic studies, pulse oximetry and re-evaluation of patient's condition.         Final Clinical Impression(s) / ED  Diagnoses Final diagnoses:  Acute on chronic congestive heart failure, unspecified heart failure type (HCC)    Rx /  DC Orders ED Discharge Orders     None         Almond Army, MD 10/08/23 1339

## 2023-10-08 NOTE — ED Provider Triage Note (Signed)
 Emergency Medicine Provider Triage Evaluation Note  Kyle Montoya , a 73 y.o. male  was evaluated in triage.  Pt complains of leg swelling.  Patient reported noticing increasing swelling to both legs and his abdomen ongoing for the past several days.  No complaints of pain or significant shortness of breath only with exertion.  Was hospitalized a month ago for 5 days due to new onset of CHF.  He has noticed an approximately 5 pounds weight gain.  No fever chills body aches  Review of Systems  Positive: As above Negative: As above  Physical Exam  BP 120/81   Pulse (!) 106   Temp 98.5 F (36.9 C)   Resp 18   Ht 5\' 9"  (1.753 m)   Wt 97.5 kg   SpO2 100%   BMI 31.75 kg/m  Gen:   Awake, no distress   Resp:  Normal effort  MSK:   Moves extremities without difficulty  Other:    Medical Decision Making  Medically screening exam initiated at 7:14 AM.  Appropriate orders placed.  [name] was informed that the remainder of the evaluation will be completed by another provider, this initial triage assessment does not replace that evaluation, and the importance of remaining in the ED until their evaluation is complete.     Debbra Fairy, PA-C 10/08/23 [phone]

## 2023-10-08 NOTE — ED Notes (Signed)
 Sent pharmacy notes on all meds that are not verified at this time.  All were due at 1415 today and they are all 3 hrs late.

## 2023-10-08 NOTE — ED Notes (Signed)
Pt given ham sandwich and ice water.

## 2023-10-08 NOTE — Telephone Encounter (Signed)
 Spoke with pt regarding lab results. Advised per Dr. Tonja Fray note to double dose of potassium and follow up with BMP in 1 week. Pt verbalized understanding and had no further questions. Routed to PCP.

## 2023-10-08 NOTE — H&P (Signed)
 History and Physical  Kyle Montoya DDU:[id number] DOB: 09-28-1950 DOA: 10/08/2023  PCP: Teofilo Fellers, NP   Chief Complaint: Leg swelling  HPI: Kyle Montoya is a 73 y.o. male with medical history significant for remote history of cholangiocarcinoma and lung cancer, well treated CML and chronic diastolic congestive heart failure being admitted to the hospital with recurrent acute heart failure with preserved EF that has failed outpatient management.  The patient was actually recently hospitalized at [facility] with complaints of dyspnea and bilateral lower extremity edema.  He was found to have left-sided pleural effusion that improved with thoracentesis.  He was also diuresed with IV Lasix , states that he improved and was discharged home.  He followed up as an outpatient with his cardiologist Dr. Gordan Latina just after the new year.  From most recent cardiology notes, it seems that the patient started to develop lower extremity edema once again and just prior to seeing cardiology his Lasix  had been increased to 80 mg p.o. daily.  Patient tells that he has continued this medication, despite which she has not had much urine output.  His lower extremity edema has continued.  He denies any cough, shortness of breath, fevers, chills, orthopnea, chest pain or significant dyspnea.  Decided to come to [facility] for evaluation today as he felt the oral Lasix  was not working.  Review of Systems: Please see HPI for pertinent positives and negatives. A complete 10 system review of systems are otherwise negative.  Past Medical History:  Diagnosis Date   Abnormal colonoscopy 02/23/2021   Abnormal transaminases 02/27/2022   Adenomatous polyp of descending colon 02/23/2021   Adverse drug reaction, initial encounter 07/04/2016   07/02/2016: Prevnar 23 vaccine given in right arm  07/02/2016: localized swelling  10/11/20167: Increase in swelling from shoulder to inner aspect of elbow.Red, warm and firm to touch.   No lip or airway swelling or stridor.  2+ brachial and radial pulses right arm.  Capillary refill < 2 seconds     Anemia in chronic illness 01/22/2013   Arthritis, degenerative 01/31/2015   Ascending aortic aneurysm (HCC) 06/27/2022   Benign fibroma of prostate 01/31/2015   BP (high blood pressure) 01/22/2013   BPH (benign prostatic hyperplasia)    CAD (coronary artery disease)    Cannot sleep 01/31/2015   Carcinoma of biliary tract (HCC) 02/15/2013   Diagnosed in 2014, Pt had whipple done at [facility], under care by Dr. Kirk Peper Oncologist, has f/u every 6-12 months.   Cataract    bilateral sx   Cervical osteoarthritis 10/18/2014   Cholangiocarcinoma of biliary tract (HCC)    Chronic cough 01/31/2015   Chronic myelogenous leukemia (HCC)    Chronic pain 01/31/2015   CML (chronic myelocytic leukemia) (HCC) 10/18/2014   Overview:   Follows with [facility], Dr. Almer Jacobson. On dasatinib , last WBC 9.6.     Compulsive tobacco user syndrome 01/31/2015   COPD (chronic obstructive pulmonary disease) (HCC)    uses inhaler   COPD with asthma (HCC) 01/22/2013   Severe obstruction on spirometry FeV1 54% FeV1/FVC 51% 01/31/2015  CXR NAD 01/25/15     Diuretic-induced hypokalemia 05/23/2023   DJD (degenerative joint disease)    Dyslipidemia    Family history of colonic polyps    Family history of prostate cancer    Full thickness rotator cuff tear 08/08/2020   Gout    High coronary artery calcium score 05/30/2023   History of colon polyps 02/23/2021   History of colonic polyps 10/01/2021  IMO SNOMED Dx Update Oct 2024     HLD (hyperlipidemia) 01/31/2015   HTN (hypertension)    on meds   Hypercalcemia 08/10/2020   Hypercholesterolemia    Hypocalcemia 08/27/2021   Hyponatremia 09/19/2023   Impingement syndrome of both shoulders 03/31/2020   Lung cancer (HCC)    Myelogenous leukemia (HCC)    Non-small cell carcinoma of lung (HCC) 01/31/2015   02/13/2015 CT Chest no evidence of  recurrence in lung cancer  Stage I adenocarcinoma diagnosed with left upper lobectomy in February 2016     OA (osteoarthritis)    PAD (peripheral artery disease) (HCC)    Personal history of gastric ulcers 02/23/2021   Renal insufficiency    Shoulder pain, left 03/29/2020   Shoulder pain, right 03/29/2020   Tendonitis    patellar   Thoracic aortic aneurysm (HCC) 06/27/2022   Wheezing 01/31/2015   Past Surgical History:  Procedure Laterality Date   BIOPSY  04/30/2021   Procedure: BIOPSY;  Surgeon: Normie Becton., MD;  Location: [facility];  Service: Gastroenterology;;   Bowel duct surg  2014   Bowel cancer   CARDIAC CATHETERIZATION  2000   CHOLECYSTECTOMY     COLONOSCOPY  06/23/2015   mild sigmoid diverticulosis. small external and internal hemorrhoids. otherwise normal colonoscopy to terminal ileum   COLONOSCOPY WITH PROPOFOL  N/A 04/30/2021   Procedure: COLONOSCOPY WITH PROPOFOL ;  Surgeon: Mansouraty, Albino Alu., MD;  Location: [facility];  Service: Gastroenterology;  Laterality: N/A;   ENDOSCOPIC MUCOSAL RESECTION N/A 04/30/2021   Procedure: ENDOSCOPIC MUCOSAL RESECTION;  Surgeon: Brice Campi Albino Alu., MD;  Location: [facility];  Service: Gastroenterology;  Laterality: N/A;   ESOPHAGOGASTRODUODENOSCOPY (EGD) WITH PROPOFOL  N/A 04/30/2021   Procedure: ESOPHAGOGASTRODUODENOSCOPY (EGD) WITH PROPOFOL ;  Surgeon: Brice Campi Albino Alu., MD;  Location: [facility];  Service: Gastroenterology;  Laterality: N/A;   HEMOSTASIS CLIP PLACEMENT  04/30/2021   Procedure: HEMOSTASIS CLIP PLACEMENT;  Surgeon: Normie Becton., MD;  Location: [facility];  Service: Gastroenterology;;   LUNG CANCER SURGERY  09/2014   partial lobectomy   POLYPECTOMY  04/30/2021   Procedure: POLYPECTOMY;  Surgeon: Mansouraty, Albino Alu., MD;  Location: [facility];  Service: Gastroenterology;;   SHOULDER OPEN ROTATOR CUFF REPAIR Right 09/27/2020   Procedure: Right shoulder mini open  rotator cuff repair, distal clavicle resection;  Surgeon: Orvan Blanch, MD;  Location: [facility];  Service: Orthopedics;  Laterality: Right;  90 mins  Choice with Block anesthesia   SUBMUCOSAL LIFTING INJECTION  04/30/2021   Procedure: SUBMUCOSAL LIFTING INJECTION;  Surgeon: Normie Becton., MD;  Location: [facility];  Service: Gastroenterology;;   UPPER GASTROINTESTINAL ENDOSCOPY     WHIPPLE PROCEDURE  02/25/2013   Procedure: WHIPPLE PROCEDURE; Surgeon: Kirk Peper, MD; Location[facility]; Service: General; Laterality: N/A;   Social History:  reports that he has quit smoking. His smoking use included cigarettes. He has a 10 pack-year smoking history. He has never used smokeless tobacco. He reports current alcohol use of about 21.0 standard drinks of alcohol per week. He reports that he does not use drugs.  Allergies  Allergen Reactions   Pneumococcal Vaccines Swelling    Family History  Problem Relation Age of Onset   Hypertension Mother    Prostate cancer Father 29       deceased   Hypertension Sister    Asthma Sister    Hypertension Brother    Cancer Paternal Grandfather    Cancer Half-Sister        unknown, dx 29  Esophageal cancer Neg Hx    Colon polyps Neg Hx    Colon cancer Neg Hx    Stomach cancer Neg Hx    Rectal cancer Neg Hx    Inflammatory bowel disease Neg Hx    Liver disease Neg Hx    Pancreatic cancer Neg Hx      Prior to Admission medications   Medication Sig Start Date End Date Taking? Authorizing Provider  albuterol  (PROVENTIL  HFA;VENTOLIN  HFA) 108 (90 BASE) MCG/ACT inhaler Inhale 2 puffs into the lungs every 6 (six) hours as needed for wheezing or shortness of breath.    [provider]  allopurinol  (ZYLOPRIM ) 300 MG tablet Take 300 mg by mouth daily.    [provider]  aspirin  EC 81 MG tablet Take 1 tablet (81 mg total) by mouth daily. Swallow whole. 05/30/23   Krasowski, Robert J, MD  Calcium Carbonate (CALCIUM 500 PO) Take 1  tablet by mouth 3 (three) times daily. 02/27/22   Mosher, Kelli A, PA-C  Cholecalciferol (VITAMIN D ) 50 MCG (2000 UT) tablet Take 2,000 Units by mouth daily.    [provider]  diclofenac Sodium (VOLTAREN) 1 % GEL Apply 2 g topically at bedtime. 07/10/20   [provider]  docusate sodium  (COLACE) 100 MG capsule Take 200 mg by mouth daily.    [provider]  fluticasone  (FLONASE ) 50 MCG/ACT nasal spray Place 2 sprays into both nostrils daily.    [provider]  furosemide  (LASIX ) 80 MG tablet Take 1 tablet (80 mg total) by mouth 2 (two) times daily. 10/02/23   Krasowski, Robert J, MD  HYDROcodone -acetaminophen  (NORCO) 7.5-325 MG tablet Take 1 tablet by mouth every 6 (six) hours as needed for moderate pain.    [provider]  irbesartan -hydrochlorothiazide  (AVALIDE) 150-12.5 MG tablet Take 1 tablet by mouth daily. 09/16/23   [provider]  methocarbamol  (ROBAXIN ) 750 MG tablet Take 750 mg by mouth every 6 (six) hours as needed for muscle spasms. 04/07/23   [provider]  mirtazapine  (REMERON ) 15 MG tablet Take 1 tablet (15 mg total) by mouth at bedtime. Patient not taking: Reported on 09/26/2023 04/17/23   Nolia Baumgartner, MD  omeprazole  (PRILOSEC) 40 MG capsule TAKE 1 CAPSULE (40 MG TOTAL) BY MOUTH 2 (TWO) TIMES DAILY BEFORE A MEAL. 01/07/23   Edmonia Gottron, PA-C  potassium chloride  (KLOR-CON  M) 10 MEQ tablet Take 2 tablets (20 mEq total) by mouth daily. 10/08/23   Krasowski, Robert J, MD  SPRYCEL  100 MG tablet TAKE 1 TABLET BY MOUTH DAILY 06/04/23   Nolia Baumgartner, MD  tamsulosin  (FLOMAX ) 0.4 MG CAPS capsule Take 0.8 mg by mouth daily. 09/08/23   [provider]  TRELEGY ELLIPTA 100-62.5-25 MCG/INH AEPB Inhale 1 puff into the lungs daily at 6 (six) AM. 12/20/20   [provider]    Physical Exam: BP 134/82   Pulse 90   Temp 98.7 F (37.1 C)   Resp 17   Ht 5\' 9"  (1.753 m)   Wt 97.5 kg   SpO2 100%    BMI 31.75 kg/m  General:  Alert, oriented, calm, in no acute distress, resting comfortably on room air in his room in the ER.  No cough, nontoxic. Cardiovascular: RRR, no murmurs or rubs, he has 3+ pitting edema in the bilateral lower extremities up to the knees Respiratory: clear to auscultation bilaterally though diminished at the right base, no wheezes, no crackles  Abdomen: soft, nontender, nondistended, normal bowel tones  heard  Skin: dry, no rashes  Musculoskeletal: no joint effusions, normal range of motion  Psychiatric: appropriate affect, normal speech  Neurologic: extraocular muscles intact, clear speech, moving all extremities with intact sensorium         Labs on Admission:  Basic Metabolic Panel: Recent Labs  Lab 10/02/23 0938 10/08/23 0722 10/08/23 1051  NA 133* 129*  --   K 3.4* 3.5 4.1  CL 91* 94*  --   CO2 25 22  --   GLUCOSE 120* 103*  --   BUN 15 23  --   CREATININE 0.67* 0.80  --   CALCIUM 8.1* 8.2*  --    Liver Function Tests: Recent Labs  Lab 10/08/23 0722  AST 99*  ALT 42  ALKPHOS 101  BILITOT 0.9  PROT 6.9  ALBUMIN  3.2*   No results for input(s): "LIPASE", "AMYLASE" in the last 168 hours. No results for input(s): "AMMONIA" in the last 168 hours. CBC: Recent Labs  Lab 10/08/23 1051  WBC 3.8*  NEUTROABS 1.6*  HGB 13.0  HCT 37.0*  MCV 106.6*  PLT 155   Cardiac Enzymes: No results for input(s): "CKTOTAL", "CKMB", "CKMBINDEX", "TROPONINI" in the last 168 hours. BNP (last 3 results) Recent Labs    09/18/23 0000 10/08/23 0722  BNP 88.8 95.0    ProBNP (last 3 results) No results for input(s): "PROBNP" in the last 8760 hours.  CBG: No results for input(s): "GLUCAP" in the last 168 hours.  Radiological Exams on Admission: CT Angio Chest PE W and/or Wo Contrast Result Date: 10/08/2023 CLINICAL DATA:  Pulmonary embolism (PE) suspected, high prob EXAM: CT ANGIOGRAPHY CHEST WITH CONTRAST TECHNIQUE: Multidetector CT imaging of the chest  was performed using the standard protocol during bolus administration of intravenous contrast. Multiplanar CT image reconstructions and MIPs were obtained to evaluate the vascular anatomy. RADIATION DOSE REDUCTION: This exam was performed according to the departmental dose-optimization program which includes automated exposure control, adjustment of the mA and/or kV according to patient size and/or use of iterative reconstruction technique. CONTRAST:  75mL OMNIPAQUE  IOHEXOL  350 MG/ML SOLN COMPARISON:  CT 09/09/2023 FINDINGS: Cardiovascular: Satisfactory opacification of the pulmonary arteries to the segmental level. No evidence of pulmonary embolism. Aortic and coronary artery atherosclerosis. Thoracic aorta is nonaneurysmal. Normal heart size. No pericardial effusion. Mediastinum/Nodes: No enlarged mediastinal, hilar, or axillary lymph nodes. Thyroid gland, trachea, and esophagus demonstrate no significant findings. Lungs/Pleura: Moderate-sized layering right-sided pleural effusion with mild compressive atelectasis. Postsurgical changes from left upper lobectomy. Previously seen left-sided effusion has resolved. No new airspace consolidation. No pneumothorax. Mild emphysema. Upper Abdomen: Hepatic steatosis.  No acute findings. Musculoskeletal: No chest wall abnormality. No acute or significant osseous findings. Review of the MIP images confirms the above findings. IMPRESSION: 1. No evidence of pulmonary embolism. 2. Moderate-sized layering right-sided pleural effusion with mild compressive atelectasis. 3. Previously seen left-sided effusion has resolved. 4. Hepatic steatosis. 5. Aortic and coronary artery atherosclerosis (ICD10-I70.0). 6. Emphysema (ICD10-J43.9). Electronically Signed   By: Leverne Reading D.O.   On: 10/08/2023 13:17   DG Chest 2 View Result Date: 10/08/2023 CLINICAL DATA:  Fluid retention.  Leg swelling. EXAM: CHEST - 2 VIEW COMPARISON:  10/02/2023. FINDINGS: Redemonstration of small layering  right pleural effusion with associated probable atelectatic changes at the right lung base. No significant interval change. No pulmonary edema. Bilateral lung fields are otherwise clear. Left lateral costophrenic angle is clear. Normal cardio-mediastinal silhouette. No acute osseous abnormalities. The soft tissues are within normal limits. IMPRESSION: *  Stable right pleural effusion with associated probable atelectasis. No pulmonary edema. Electronically Signed   By: Beula Brunswick M.D.   On: 10/08/2023 07:51   Assessment/Plan Kyle Montoya is a 73 y.o. male with medical history significant for remote history of cholangiocarcinoma and lung cancer, currently well treated CML and chronic diastolic congestive heart failure being admitted to the hospital with recurrent acute heart failure with preserved EF that has failed outpatient management.   Acute on chronic heart failure with preserved EF-patient notes worsening lower extremity edema, and no significant increase in urine output despite increasing Lasix  to 80 mg p.o. daily.  However his BNP is not elevated.  Last echo 09/10/2023 with preserved EF. -Inpatient admission to telemetry -Continuous cardiac monitoring -Heart healthy diet -Increase Lasix  to 60 mg IV twice daily -Daily potassium supplementation -Nonurgent inpatient cardiology consultation requested  Right-sided pleural effusion-stable when compared to prior, could in theory be related to his dastinib which can cause effusions.  However given his history and overall presentation including lower extremity edema, I feel his effusions are most likely related to heart failure and can be treated with diuresis as above.  He is stable on room air.  CML-continue outpatient follow-up with Dr. Harles Lied at [facility], and continue Dastinib as outpatient.  Hyponatremia-mild and asymptomatic, this is a chronic issue for him but now slightly worse.  I suspect probably due to recent diuresis.   Will monitor with daily labs in the setting of continued diuretic, will discontinue his home HCTZ for the time being.  Hypertension-continue home dose ARB, though may need to be held as blood pressure is borderline low and in the setting of IV diuretic.  BPH-Flomax   DVT prophylaxis: Lovenox      Code Status: Full Code  Consults called: Inpatient cardiology  Admission status: The appropriate patient status for this patient is INPATIENT. Inpatient status is judged to be reasonable and necessary in order to provide the required intensity of service to ensure the patient's safety. The patient's presenting symptoms, physical exam findings, and initial radiographic and laboratory data in the context of their chronic comorbidities is felt to place them at high risk for further clinical deterioration. Furthermore, it is not anticipated that the patient will be medically stable for discharge from the hospital within 2 midnights of admission.    I certify that at the point of admission it is my clinical judgment that the patient will require inpatient hospital care spanning beyond 2 midnights from the point of admission due to high intensity of service, high risk for further deterioration and high frequency of surveillance required  Time spent: 59 minutes  Malena Timpone Rickey Charm MD [facility] Pager [phone]  If 7PM-7AM, please contact night-coverage [email or web address] Password [email or web address]  10/08/2023, 2:03 PM

## 2023-10-08 NOTE — ED Triage Notes (Signed)
 Arrives with bilateral lower extremity edema over the past couple days.   Says he was recently admitted to [facility] last week for CHF/ PNA.   Shortness of breath with exertion.

## 2023-10-09 ENCOUNTER — Inpatient Hospital Stay (HOSPITAL_COMMUNITY): Payer: BC Managed Care – PPO

## 2023-10-09 DIAGNOSIS — D649 Anemia, unspecified: Secondary | ICD-10-CM | POA: Diagnosis not present

## 2023-10-09 DIAGNOSIS — I959 Hypotension, unspecified: Secondary | ICD-10-CM

## 2023-10-09 DIAGNOSIS — J90 Pleural effusion, not elsewhere classified: Secondary | ICD-10-CM

## 2023-10-09 DIAGNOSIS — E876 Hypokalemia: Secondary | ICD-10-CM | POA: Diagnosis not present

## 2023-10-09 DIAGNOSIS — I5031 Acute diastolic (congestive) heart failure: Secondary | ICD-10-CM | POA: Diagnosis not present

## 2023-10-09 DIAGNOSIS — M7989 Other specified soft tissue disorders: Secondary | ICD-10-CM | POA: Diagnosis not present

## 2023-10-09 LAB — CBC
HCT: 33 % — ABNORMAL LOW (ref 39.0–52.0)
Hemoglobin: 11.8 g/dL — ABNORMAL LOW (ref 13.0–17.0)
MCH: 38.4 pg — ABNORMAL HIGH (ref 26.0–34.0)
MCHC: 35.8 g/dL (ref 30.0–36.0)
MCV: 107.5 fL — ABNORMAL HIGH (ref 80.0–100.0)
Platelets: 128 10*3/uL — ABNORMAL LOW (ref 150–400)
RBC: 3.07 MIL/uL — ABNORMAL LOW (ref 4.22–5.81)
RDW: 14.8 % (ref 11.5–15.5)
WBC: 2.9 10*3/uL — ABNORMAL LOW (ref 4.0–10.5)
nRBC: 0 % (ref 0.0–0.2)

## 2023-10-09 LAB — BASIC METABOLIC PANEL
Anion gap: 9 (ref 5–15)
BUN: 24 mg/dL — ABNORMAL HIGH (ref 8–23)
CO2: 26 mmol/L (ref 22–32)
Calcium: 7.7 mg/dL — ABNORMAL LOW (ref 8.9–10.3)
Chloride: 96 mmol/L — ABNORMAL LOW (ref 98–111)
Creatinine, Ser: 0.58 mg/dL — ABNORMAL LOW (ref 0.61–1.24)
GFR, Estimated: >60 mL/min (ref >60)
Glucose, Bld: 95 mg/dL (ref 70–99)
Potassium: 2.7 mmol/L — CL (ref 3.5–5.1)
Sodium: 131 mmol/L — ABNORMAL LOW (ref 135–145)

## 2023-10-09 LAB — MAGNESIUM: Magnesium: 1.7 mg/dL (ref 1.7–2.4)

## 2023-10-09 MED ORDER — POTASSIUM CHLORIDE CRYS ER 20 MEQ PO TBCR
40.0000 meq | EXTENDED_RELEASE_TABLET | ORAL | Status: AC
  Administered 2023-10-09 (×2): 40 meq via ORAL
  Filled 2023-10-09 (×2): qty 2

## 2023-10-09 MED ORDER — MAGNESIUM SULFATE 2 GM/50ML IV SOLN
2.0000 g | Freq: Once | INTRAVENOUS | Status: AC
  Administered 2023-10-09: 2 g via INTRAVENOUS
  Filled 2023-10-09: qty 50

## 2023-10-09 MED ORDER — FUROSEMIDE 10 MG/ML IJ SOLN
60.0000 mg | Freq: Two times a day (BID) | INTRAMUSCULAR | Status: DC
  Administered 2023-10-10: 60 mg via INTRAVENOUS
  Filled 2023-10-09: qty 6

## 2023-10-09 MED ORDER — IPRATROPIUM-ALBUTEROL 0.5-2.5 (3) MG/3ML IN SOLN
3.0000 mL | Freq: Once | RESPIRATORY_TRACT | Status: AC
  Administered 2023-10-09: 3 mL via RESPIRATORY_TRACT
  Filled 2023-10-09: qty 3

## 2023-10-09 MED ORDER — ALLOPURINOL 300 MG PO TABS
300.0000 mg | ORAL_TABLET | Freq: Every day | ORAL | Status: DC
  Administered 2023-10-09 – 2023-10-10 (×2): 300 mg via ORAL
  Filled 2023-10-09 (×2): qty 1

## 2023-10-09 MED ORDER — HYDROCODONE-ACETAMINOPHEN 5-325 MG PO TABS
1.0000 | ORAL_TABLET | Freq: Once | ORAL | Status: AC
Start: 2023-10-09 — End: 2023-10-09
  Administered 2023-10-09: 1 via ORAL
  Filled 2023-10-09: qty 1

## 2023-10-09 MED ORDER — POTASSIUM CHLORIDE CRYS ER 20 MEQ PO TBCR
40.0000 meq | EXTENDED_RELEASE_TABLET | Freq: Four times a day (QID) | ORAL | Status: DC
  Administered 2023-10-09: 40 meq via ORAL
  Filled 2023-10-09: qty 2

## 2023-10-09 NOTE — Progress Notes (Signed)
[facility] PROGRESS NOTE   Kyle Montoya ZOX:[id number] DOB: October 04, 1950 DOA: 10/08/2023  PCP: Lars Mage, NP  Brief History: 73 y.o. male with medical history significant for remote history of cholangiocarcinoma and lung cancer, well treated CML and chronic diastolic congestive heart failure being admitted to the hospital with recurrent acute heart failure with preserved EF that has failed outpatient management.  The patient was actually recently hospitalized at [facility] with complaints of dyspnea and bilateral lower extremity edema.  He was found to have left-sided pleural effusion that improved with thoracentesis.  He was also diuresed with IV Lasix, states that he improved and was discharged home.  After going home despite taking furosemide he experienced worsening lower extremity edema.  So he decided to come back to the hospital and this time he came to [facility].     Consultants: Cardiology  Procedures: Lower extremity venous Doppler studies will be ordered    Subjective/Interval History: Patient denies any shortness of breath per se but complains of worsening lower extremity swelling.  Denies any chest pain.  No dizziness or lightheadedness.    Assessment/Plan:  Acute on chronic diastolic CHF Was recently hospitalized at [facility] for acute CHF which improved after he was given IV diuretics.  Was discharged on oral furosemide.  Experienced worsening lower extremity swelling so he came back to the hospital.  His BNP is surprisingly normal. Currently on IV furosemide.  Blood pressure noted to be low.  Will hold his antihypertensives. Will also get lower extremity Doppler studies to rule out DVT. Last echocardiogram was done at [facility] on September 10, 2023 which showed EF of 55 to 60%. Strict ins and outs and daily weights.  Hypokalemia/hyponatremia Likely due to diuretics.  Will supplement aggressively.  Magnesium is 1.8. Monitor sodium  levels.  Hypotension Borderline low blood pressures noted.  Avapro has been discontinued for now.  He is asymptomatic.  Apart from diuretics he is not on any other blood pressure lowering agents.  Macrocytic anemia Check anemia panel if not done recently.  Mild thrombocytopenia Monitor closely.  Moderate right-sided pleural effusion Seen on CT angiogram.  Patient mentions that he has had these effusions for a long period of time.  He denies any shortness of breath.  No cough.  No chest pain.  Continue to monitor for now.  History of CML Outpatient follow-up with medical oncology at [facility].  History of BPH Continue Flomax.  Obesity Estimated body mass index is 31.75 kg/m as calculated from the following:   Height as of this encounter: 5\' 9"  (1.753 m).   Weight as of this encounter: 97.5 kg.   DVT Prophylaxis: Lovenox Code Status: Full code Family Communication: Discussed with patient Disposition Plan: Hopefully return home when improved  Status is: Inpatient Remains inpatient appropriate because: Concern for acute CHF/hypokalemia      Medications: Scheduled:  aspirin EC  81 mg Oral Daily   enoxaparin (LOVENOX) injection  40 mg Subcutaneous Q24H   fluticasone  2 spray Each Nare Daily   fluticasone furoate-vilanterol  1 puff Inhalation Daily   And   umeclidinium bromide  1 puff Inhalation Daily   [START ON 10/10/2023] furosemide  60 mg Intravenous BID   pantoprazole  40 mg Oral Daily   potassium chloride  40 mEq Oral Q6H   tamsulosin  0.8 mg Oral Daily   Continuous: WJX:BJYNWGNFAOZHY **OR** acetaminophen, albuterol, methocarbamol, ondansetron **OR** ondansetron (ZOFRAN) IV, traZODone  Antibiotics: Anti-infectives (From admission, onward)  None       Objective:  Vital Signs  Vitals:   10/09/23 0500 10/09/23 0530 10/09/23 0800 10/09/23 0914  BP: 119/82  90/67   Pulse: 82  76   Resp: 16  16   Temp:  98.1 F (36.7 C)  98.6 F (37 C)  TempSrc:   Oral  Oral  SpO2: 97%  98%   Weight:      Height:        Intake/Output Summary (Last 24 hours) at 10/09/2023 1030 Last data filed at 10/09/2023 0509 Gross per 24 hour  Intake --  Output 1000 ml  Net -1000 ml   Filed Weights   10/08/23 0703  Weight: 97.5 kg    General appearance: Awake alert.  In no distress Resp: Diminished air entry at the bases.  Normal effort. Cardio: S1-S2 is normal regular.  No S3-S4.  No rubs murmurs or bruit GI: Abdomen is soft.  Nontender nondistended.  Bowel sounds are present normal.  No masses organomegaly Extremities: 2+ edema bilateral lower extremities Neurologic: Alert and oriented x3.  No focal neurological deficits.    Lab Results:  Data Reviewed: I have personally reviewed following labs and reports of the imaging studies  CBC: Recent Labs  Lab 10/08/23 1051 10/09/23 0337  WBC 3.8* 2.9*  NEUTROABS 1.6*  --   HGB 13.0 11.8*  HCT 37.0* 33.0*  MCV 106.6* 107.5*  PLT 155 128*    Basic Metabolic Panel: Recent Labs  Lab 10/08/23 0722 10/08/23 1051 10/09/23 0337  NA 129*  --  131*  K 3.5 4.1 2.7*  CL 94*  --  96*  CO2 22  --  26  GLUCOSE 103*  --  95  BUN 23  --  24*  CREATININE 0.80  --  0.58*  CALCIUM 8.2*  --  7.7*  MG  --   --  1.7    GFR: Estimated Creatinine Clearance: 94.7 mL/min (A) (by C-G formula based on SCr of 0.58 mg/dL (L)).  Liver Function Tests: Recent Labs  Lab 10/08/23 0722  AST 99*  ALT 42  ALKPHOS 101  BILITOT 0.9  PROT 6.9  ALBUMIN 3.2*     Radiology Studies: CT Angio Chest PE W and/or Wo Contrast Result Date: 10/08/2023 CLINICAL DATA:  Pulmonary embolism (PE) suspected, high prob EXAM: CT ANGIOGRAPHY CHEST WITH CONTRAST TECHNIQUE: Multidetector CT imaging of the chest was performed using the standard protocol during bolus administration of intravenous contrast. Multiplanar CT image reconstructions and MIPs were obtained to evaluate the vascular anatomy. RADIATION DOSE REDUCTION: This exam was  performed according to the departmental dose-optimization program which includes automated exposure control, adjustment of the mA and/or kV according to patient size and/or use of iterative reconstruction technique. CONTRAST:  75mL OMNIPAQUE IOHEXOL 350 MG/ML SOLN COMPARISON:  CT 09/09/2023 FINDINGS: Cardiovascular: Satisfactory opacification of the pulmonary arteries to the segmental level. No evidence of pulmonary embolism. Aortic and coronary artery atherosclerosis. Thoracic aorta is nonaneurysmal. Normal heart size. No pericardial effusion. Mediastinum/Nodes: No enlarged mediastinal, hilar, or axillary lymph nodes. Thyroid gland, trachea, and esophagus demonstrate no significant findings. Lungs/Pleura: Moderate-sized layering right-sided pleural effusion with mild compressive atelectasis. Postsurgical changes from left upper lobectomy. Previously seen left-sided effusion has resolved. No new airspace consolidation. No pneumothorax. Mild emphysema. Upper Abdomen: Hepatic steatosis.  No acute findings. Musculoskeletal: No chest wall abnormality. No acute or significant osseous findings. Review of the MIP images confirms the above findings. IMPRESSION: 1. No evidence of pulmonary embolism. 2.  Moderate-sized layering right-sided pleural effusion with mild compressive atelectasis. 3. Previously seen left-sided effusion has resolved. 4. Hepatic steatosis. 5. Aortic and coronary artery atherosclerosis (ICD10-I70.0). 6. Emphysema (ICD10-J43.9). Electronically Signed   By: Duanne Guess D.O.   On: 10/08/2023 13:17   DG Chest 2 View Result Date: 10/08/2023 CLINICAL DATA:  Fluid retention.  Leg swelling. EXAM: CHEST - 2 VIEW COMPARISON:  10/02/2023. FINDINGS: Redemonstration of small layering right pleural effusion with associated probable atelectatic changes at the right lung base. No significant interval change. No pulmonary edema. Bilateral lung fields are otherwise clear. Left lateral costophrenic angle is clear.  Normal cardio-mediastinal silhouette. No acute osseous abnormalities. The soft tissues are within normal limits. IMPRESSION: *Stable right pleural effusion with associated probable atelectasis. No pulmonary edema. Electronically Signed   By: Jules Schick M.D.   On: 10/08/2023 07:51       LOS: 1 day   Divina Neale  [facility] Pager on [email or web address]  10/09/2023, 10:30 AM

## 2023-10-09 NOTE — Care Management Obs Status (Signed)
MEDICARE OBSERVATION STATUS NOTIFICATION   Patient Details  Name: Kyle Montoya MRN: [id number] Date of Birth: 1951/06/16   Medicare Observation Status Notification Given:  Yes    Adrian Prows, RN 10/09/2023, 3:25 PM

## 2023-10-09 NOTE — ED Notes (Signed)
Pt reports improvement following x2 neb applications.

## 2023-10-09 NOTE — TOC Initial Note (Signed)
Transition of Care [facility]) - Initial/Assessment Note    Patient Details  Name: Kyle Montoya MRN: [id number] Date of Birth: 1951-01-29  Transition of Care [facility]) CM/SW Contact:    Adrian Prows, RN Phone Number: 10/09/2023, 3:28 PM  Clinical Narrative:                 TOC for d/c planning; spoke w/ pt in room; pt says he lives at home; he plans to return at d/c; he identified POC Meritt Cerbone, brother, [phone]; pt verified insurance/PCP; pt says he has transportation; he denies SDOH risks; pt says he has walker, shower chair, and nebulizer; pt says his [facility] was set up through [facility]; he does not have home oxygen; also orders received for Code 44; explained code 44 to pt; he verbalized understanding, and signed document; copy of MOON given to pt; TOC will follow.  Expected Discharge Plan: Home/Self Care Barriers to Discharge: Continued Medical Work up   Patient Goals and CMS Choice Patient states their goals for this hospitalization and ongoing recovery are:: home CMS Medicare.gov Compare Post Acute Care list provided to:: Patient        Expected Discharge Plan and Services   Discharge Planning Services: CM Consult   Living arrangements for the past 2 months: Mobile Home                                      Prior Living Arrangements/Services Living arrangements for the past 2 months: Mobile Home Lives with:: Self Patient language and need for interpreter reviewed:: Yes Do you feel safe going back to the place where you live?: Yes      Need for Family Participation in Patient Care: Yes (Comment) Care giver support system in place?: Yes (comment) Current home services: DME, Home RN (walker, shower chair, HHRN from [facility]) Criminal Activity/Legal Involvement Pertinent to Current Situation/Hospitalization: No - Comment as needed  Activities of Daily Living      Permission Sought/Granted Permission sought to share information with :  Case Manager Permission granted to share information with : Yes, Verbal Permission Granted  Share Information with NAME: Case Manager     Permission granted to share info w Relationship: Walker Neuman (brother) [phone]     Emotional Assessment Appearance:: Appears stated age Attitude/Demeanor/Rapport: Gracious Affect (typically observed): Accepting Orientation: : Oriented to Self, Oriented to Place, Oriented to  Time, Oriented to Situation Alcohol / Substance Use: Not Applicable Psych Involvement: No (comment)  Admission diagnosis:  Acute diastolic (congestive) heart failure (HCC) [I50.31] Acute on chronic congestive heart failure, unspecified heart failure type [facility]) [I50.9] Patient Active Problem List   Diagnosis Date Noted   Acute diastolic (congestive) heart failure (HCC) 10/08/2023   Congestive heart failure (CHF) (HCC) 09/26/2023   BPH (benign prostatic hyperplasia)    CAD (coronary artery disease)    Chronic myelogenous leukemia (HCC)    COPD (chronic obstructive pulmonary disease) (HCC)    DJD (degenerative joint disease)    Dyslipidemia    Family history of colonic polyps    HTN (hypertension)    Hypercholesterolemia    Lung cancer (HCC)    Myelogenous leukemia (HCC)    OA (osteoarthritis)    PAD (peripheral artery disease) (HCC)    Renal insufficiency    Tendonitis    Hyponatremia 09/19/2023   High coronary artery calcium score 05/30/2023   Diuretic-induced hypokalemia 05/23/2023  Class: Diagnosis of   Ascending aortic aneurysm (HCC) 06/27/2022   Thoracic aortic aneurysm (HCC) 06/27/2022   Abnormal transaminases 02/27/2022   History of colonic polyps 10/01/2021   Family history of prostate cancer 10/01/2021   Hypocalcemia 08/27/2021   Adenomatous polyp of descending colon 02/23/2021   History of colon polyps 02/23/2021   Abnormal colonoscopy 02/23/2021   Personal history of gastric ulcers 02/23/2021   Hypercalcemia 08/10/2020   Full thickness rotator  cuff tear 08/08/2020   Impingement syndrome of both shoulders 03/31/2020   Shoulder pain, right 03/29/2020   Shoulder pain, left 03/29/2020   Adverse drug reaction, initial encounter 07/04/2016   Adult BMI 30+ 01/31/2015   Benign fibroma of prostate 01/31/2015   Cataract 01/31/2015   Chronic cough 01/31/2015   Chronic pain 01/31/2015   Arthritis, degenerative 01/31/2015   HLD (hyperlipidemia) 01/31/2015   Cannot sleep 01/31/2015   Non-small cell carcinoma of lung (HCC) 01/31/2015   Compulsive tobacco user syndrome 01/31/2015   Wheezing 01/31/2015   CML (chronic myelocytic leukemia) (HCC) 10/18/2014   Gout 10/18/2014   Cervical osteoarthritis 10/18/2014   Carcinoma of biliary tract (HCC) 02/15/2013   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   Anemia in chronic illness 01/22/2013   COPD with asthma (HCC) 01/22/2013   BP (high blood pressure) 01/22/2013   PCP:  Lars Mage, NP Pharmacy:   [id number] - [place], [place] - [address] [place] [address] Phone: [phone] Fax: [phone]  [facility] - [place], [place] - [address] [facility] [address] [place] [address] Phone: [phone] Fax: [phone]     Social Drivers of Health (SDOH) Social History: SDOH Screenings   Food Insecurity: No Food Insecurity (10/09/2023)  Housing: Low Risk  (10/09/2023)  Transportation Needs: No Transportation Needs (10/09/2023)  Utilities: Not At Risk (10/09/2023)  Tobacco Use: Medium Risk (10/08/2023)   SDOH Interventions: Food Insecurity Interventions: Intervention Not Indicated, Inpatient TOC Housing Interventions: Intervention Not Indicated, Inpatient TOC Transportation Interventions: Intervention Not Indicated, Inpatient TOC Utilities Interventions: Intervention Not Indicated, Inpatient TOC   Readmission Risk Interventions    10/09/2023    3:26 PM  Readmission Risk Prevention Plan  Transportation Screening  Complete  PCP or Specialist Appt within 5-7 Days Complete  Home Care Screening Complete  Medication Review (RN CM) Complete

## 2023-10-09 NOTE — Consult Note (Addendum)
Cardiology Consultation   Patient ID: Kyle Montoya MRN: [id number]; DOB: 05-May-1951  Admit date: 10/08/2023 Date of Consult: 10/09/2023  PCP:  Kyle Mage, NP   [facility] Providers Cardiologist:  None        Patient Profile:   Kyle Montoya is a 73 y.o. male with a history of artery calcifications noted on prior CT scan with negative stress test in 06/2023, chronic HFpEF, ascending aortic aneurysm, hypertension, hyperlipidemia, COPD,  anemia of chronic disease, and cancer (non-small cell lung cancer, cholangiocarcinoma of biliary tract s/p Whipple in 2014, and CML) who is being seen 10/09/2023 for the evaluation of CHF at the request of Dr. Kirby Montoya.  History of Present Illness:   Kyle Montoya is a 73 year old male with the above history who is followed by Dr. Bing Montoya.  He was referred to Dr. Bing Montoya in 05/2019 for for further evaluation ration of coronary artery calcifications as well as enlargement of the aorta noted on recent CT scan.  He reported getting short of breath easily at that time but denied any chest pain.  Lexiscan Myoview and echo were ordered for further evaluation.  Myoview in 06/2023 was low risk with no evidence of ischemia. It does not look like an outpatient Echo was done. He was admitted at [facility] in 08/2023 for what sounds like CHF and pleural effusion presenting with shortness of breath and lower extremity edema.  Echo at that time showed LVEF of 55-60% with mild AI and mild TR.  He was last seen by Dr. Bing Montoya on 09/26/2023 at which time he was still having lower extremity edema but no shortness of breath.  He had not been taking the appropriate dose of Lasix and that this had just been increased the day before visit. He was continued on Lasix 80mg  daily.  Patient presented to the [facility] ED on 10/08/2023 for further evaluation of persistent lower extremity edema and shortness of breath.  KG showed sinus tachycardia, rate 105 beats  minute, with known RBBB but no acute ST/T changes. Initial high-sensitivity troponin 20. BNP normal at 95. Chest x-ray showed stable right pleural effusion with associated probable atelectasis but no edema. Chest CTA was negative for PE but showed a moderate-sized layer right sided pleural effusion with mild compressive atelectasis but the previously seen left sided effusion had resolved. WBC 3.8, Hgb 13.0, Plts 155. Na 129, K 3.5, Glucose 103, BUN 23, Cr 0.80. Albumin 3.2, AST 99, ALT 42, Alk Phos 101, Total Bili 0.9. Patient was admitted to the Internal Medicine Service and started on IV Lasix. Cardiology consulted for further evaluation.  Patient states he presented due to persistent lower extremity edema despite higher dose of Lasix. He denies any real increase in shortness of breath with me. He states he has some mild dyspnea at baseline with exertion due to his asthma but this has been stable. He denies any shortness of breath at rest. No significant breathing difficult at night. He states when he first lays down at night he will have to lay on 3 pillow but does not have to use them all night. No new or worsening orthopnea. No PND. He does report about a 4-5 lbs weight gain since recent hospitalization and describes some abdominal distension. No chest pain or palpitations. He reports some dizziness when his BP drops too low. He also describes one syncopal episode that was preceded by dizziness prior to recent admission at [facility]. He states his BP dropped a little at that  time. Unable to see records from [place]. He denies any other episodes of syncope. No recent fevers or illness. He reports a dry cough but attributes this to the weather. No GI symptoms. No abnormal bleeding in urine or stools.  He states he is feeling better after the IV Lasix and feels like his pants are fitting looser.  He denies any history of tobacco use. He denies any known family history of CHF.  Past Medical History:   Diagnosis Date   Abnormal colonoscopy 02/23/2021   Abnormal transaminases 02/27/2022   Adenomatous polyp of descending colon 02/23/2021   Adverse drug reaction, initial encounter 07/04/2016   07/02/2016: Prevnar 23 vaccine given in right arm  07/02/2016: localized swelling  10/11/20167: Increase in swelling from shoulder to inner aspect of elbow.Red, warm and firm to touch.  No lip or airway swelling or stridor.  2+ brachial and radial pulses right arm.  Capillary refill < 2 seconds     Anemia in chronic illness 01/22/2013   Arthritis, degenerative 01/31/2015   Ascending aortic aneurysm (HCC) 06/27/2022   Benign fibroma of prostate 01/31/2015   BP (high blood pressure) 01/22/2013   BPH (benign prostatic hyperplasia)    CAD (coronary artery disease)    Cannot sleep 01/31/2015   Carcinoma of biliary tract (HCC) 02/15/2013   Diagnosed in 2014, Pt had whipple done at [facility], under care by Dr. Deveron Furlong Oncologist, has f/u every 6-12 months.   Cataract    bilateral sx   Cervical osteoarthritis 10/18/2014   Cholangiocarcinoma of biliary tract (HCC)    Chronic cough 01/31/2015   Chronic myelogenous leukemia (HCC)    Chronic pain 01/31/2015   CML (chronic myelocytic leukemia) (HCC) 10/18/2014   Overview:   Follows with [facility], Dr. Gilman Buttner. On dasatinib, last WBC 9.6.     Compulsive tobacco user syndrome 01/31/2015   COPD (chronic obstructive pulmonary disease) (HCC)    uses inhaler   COPD with asthma (HCC) 01/22/2013   Severe obstruction on spirometry FeV1 54% FeV1/FVC 51% 01/31/2015  CXR NAD 01/25/15     Diuretic-induced hypokalemia 05/23/2023   DJD (degenerative joint disease)    Dyslipidemia    Family history of colonic polyps    Family history of prostate cancer    Full thickness rotator cuff tear 08/08/2020   Gout    High coronary artery calcium score 05/30/2023   History of colon polyps 02/23/2021   History of colonic polyps 10/01/2021   IMO SNOMED Dx Update  Oct 2024     HLD (hyperlipidemia) 01/31/2015   HTN (hypertension)    on meds   Hypercalcemia 08/10/2020   Hypercholesterolemia    Hypocalcemia 08/27/2021   Hyponatremia 09/19/2023   Impingement syndrome of both shoulders 03/31/2020   Lung cancer (HCC)    Myelogenous leukemia (HCC)    Non-small cell carcinoma of lung (HCC) 01/31/2015   02/13/2015 CT Chest no evidence of recurrence in lung cancer  Stage I adenocarcinoma diagnosed with left upper lobectomy in February 2016     OA (osteoarthritis)    PAD (peripheral artery disease) (HCC)    Personal history of gastric ulcers 02/23/2021   Renal insufficiency    Shoulder pain, left 03/29/2020   Shoulder pain, right 03/29/2020   Tendonitis    patellar   Thoracic aortic aneurysm (HCC) 06/27/2022   Wheezing 01/31/2015    Past Surgical History:  Procedure Laterality Date   BIOPSY  04/30/2021   Procedure: BIOPSY;  Surgeon: Lemar Lofty., MD;  Location: [facility] ENDOS[facility];  Service: Gastroenterology;;   Bowel duct surg  2014   Bowel cancer   CARDIAC CATHETERIZATION  2000   CHOLECYSTECTOMY     COLONOSCOPY  06/23/2015   mild sigmoid diverticulosis. small external and internal hemorrhoids. otherwise normal colonoscopy to terminal ileum   COLONOSCOPY WITH PROPOFOL N/A 04/30/2021   Procedure: COLONOSCOPY WITH PROPOFOL;  Surgeon: Meridee Score Netty Starring., MD;  Location: [facility];  Service: Gastroenterology;  Laterality: N/A;   ENDOSCOPIC MUCOSAL RESECTION N/A 04/30/2021   Procedure: ENDOSCOPIC MUCOSAL RESECTION;  Surgeon: Meridee Score Netty Starring., MD;  Location: [facility];  Service: Gastroenterology;  Laterality: N/A;   ESOPHAGOGASTRODUODENOSCOPY (EGD) WITH PROPOFOL N/A 04/30/2021   Procedure: ESOPHAGOGASTRODUODENOSCOPY (EGD) WITH PROPOFOL;  Surgeon: Meridee Score Netty Starring., MD;  Location: [facility];  Service: Gastroenterology;  Laterality: N/A;   HEMOSTASIS CLIP PLACEMENT  04/30/2021   Procedure: HEMOSTASIS CLIP PLACEMENT;   Surgeon: Lemar Lofty., MD;  Location: [facility];  Service: Gastroenterology;;   LUNG CANCER SURGERY  09/2014   partial lobectomy   POLYPECTOMY  04/30/2021   Procedure: POLYPECTOMY;  Surgeon: Mansouraty, Netty Starring., MD;  Location: [facility];  Service: Gastroenterology;;   SHOULDER OPEN ROTATOR CUFF REPAIR Right 09/27/2020   Procedure: Right shoulder mini open rotator cuff repair, distal clavicle resection;  Surgeon: Jene Every, MD;  Location: [facility];  Service: Orthopedics;  Laterality: Right;  90 mins  Choice with Block anesthesia   SUBMUCOSAL LIFTING INJECTION  04/30/2021   Procedure: SUBMUCOSAL LIFTING INJECTION;  Surgeon: Lemar Lofty., MD;  Location: [facility];  Service: Gastroenterology;;   UPPER GASTROINTESTINAL ENDOSCOPY     WHIPPLE PROCEDURE  02/25/2013   Procedure: WHIPPLE PROCEDURE; Surgeon: Deveron Furlong, MD; Location: [facility] MAIN OR; Service: General; Laterality: N/A;     Home Medications:  Prior to Admission medications   Medication Sig Start Date End Date Taking? Authorizing Provider  albuterol (PROVENTIL HFA;VENTOLIN HFA) 108 (90 BASE) MCG/ACT inhaler Inhale 2 puffs into the lungs every 6 (six) hours as needed for wheezing or shortness of breath.   Yes [provider]  allopurinol (ZYLOPRIM) 300 MG tablet Take 300 mg by mouth daily.   Yes [provider]  aspirin EC 81 MG tablet Take 1 tablet (81 mg total) by mouth daily. Swallow whole. 05/30/23  Yes Georgeanna Lea, MD  Calcium Carbonate (CALCIUM 500 PO) Take 1 tablet by mouth 3 (three) times daily. 02/27/22  Yes Mosher, Cameron Proud, PA-C  Cholecalciferol (VITAMIN D) 50 MCG (2000 UT) tablet Take 2,000 Units by mouth daily.   Yes [provider]  diclofenac Sodium (VOLTAREN) 1 % GEL Apply 2 g topically at bedtime. 07/10/20  Yes [provider]  fluticasone (FLONASE) 50 MCG/ACT nasal spray Place 2 sprays into both nostrils daily.   Yes [provider]   furosemide (LASIX) 80 MG tablet Take 1 tablet (80 mg total) by mouth 2 (two) times daily. 10/02/23  Yes Georgeanna Lea, MD  HYDROcodone-acetaminophen (NORCO/VICODIN) 5-325 MG tablet Take 1 tablet by mouth 4 (four) times daily as needed for moderate pain (pain score 4-6). 09/27/23  Yes [provider]  irbesartan-hydrochlorothiazide (AVALIDE) 150-12.5 MG tablet Take 1 tablet by mouth daily. 09/16/23  Yes [provider]  methocarbamol (ROBAXIN) 750 MG tablet Take 750 mg by mouth every 6 (six) hours as needed for muscle spasms. 04/07/23  Yes [provider]  omeprazole (PRILOSEC) 40 MG capsule TAKE 1 CAPSULE (40 MG TOTAL) BY MOUTH 2 (TWO) TIMES DAILY BEFORE A MEAL. 01/07/23  Yes Quentin Mulling R, PA-C  potassium chloride (KLOR-CON M) 10 MEQ tablet Take 2 tablets (20 mEq total) by mouth daily. 10/08/23  Yes Georgeanna Lea, MD  SPRYCEL 100 MG tablet TAKE 1 TABLET BY MOUTH DAILY 06/04/23  Yes Dellia Beckwith, MD  tamsulosin (FLOMAX) 0.4 MG CAPS capsule Take 0.8 mg by mouth daily. 09/08/23  Yes [provider]  TRELEGY ELLIPTA 100-62.5-25 MCG/INH AEPB Inhale 1 puff into the lungs daily at 6 (six) AM. 12/20/20  Yes [provider]  docusate sodium (COLACE) 100 MG capsule Take 200 mg by mouth daily. Patient not taking: Reported on 10/08/2023    [provider]  HYDROcodone-acetaminophen (NORCO) 7.5-325 MG tablet Take 1 tablet by mouth every 6 (six) hours as needed for moderate pain. Patient not taking: Reported on 10/08/2023    [provider]    Inpatient Medications: Scheduled Meds:  aspirin EC  81 mg Oral Daily   enoxaparin (LOVENOX) injection  40 mg Subcutaneous Q24H   fluticasone  2 spray Each Nare Daily   fluticasone furoate-vilanterol  1 puff Inhalation Daily   And   umeclidinium bromide  1 puff Inhalation Daily   [START ON 10/10/2023] furosemide  60 mg Intravenous BID   pantoprazole  40 mg Oral Daily   potassium chloride   40 mEq Oral Q6H   tamsulosin  0.8 mg Oral Daily   Continuous Infusions:  PRN Meds: acetaminophen **OR** acetaminophen, albuterol, methocarbamol, ondansetron **OR** ondansetron (ZOFRAN) IV, traZODone  Allergies:    Allergies  Allergen Reactions   Pneumococcal Vaccines Swelling    Social History:   Social History   Socioeconomic History   Marital status: Married    Spouse name: Not on file   Number of children: 2   Years of education: Not on file   Highest education level: Not on file  Occupational History   Occupation: environmental  Tobacco Use   Smoking status: Former    Current packs/day: 0.25    Average packs/day: 0.3 packs/day for 40.0 years (10.0 ttl pk-yrs)    Types: Cigarettes   Smokeless tobacco: Never  Vaping Use   Vaping status: Never Used  Substance and Sexual Activity   Alcohol use: Yes    Alcohol/week: 21.0 standard drinks of alcohol    Types: 21 Cans of beer per week   Drug use: Never   Sexual activity: Not on file  Other Topics Concern   Not on file  Social History Narrative   Married, 2 children.   Works moves heavy equipment x 30 yrs.   Social Drivers of Corporate investment banker Strain: Not on file  Food Insecurity: Not on file  Transportation Needs: Not on file  Physical Activity: Not on file  Stress: Not on file  Social Connections: Not on file  Intimate Partner Violence: Not on file    Family History:   Family History  Problem Relation Age of Onset   Hypertension Mother    Prostate cancer Father 29       deceased   Hypertension Sister    Asthma Sister    Hypertension Brother    Cancer Paternal Grandfather    Cancer Half-Sister        unknown, dx 64   Esophageal cancer Neg Hx    Colon polyps Neg Hx    Colon cancer Neg Hx    Stomach cancer Neg Hx    Rectal cancer Neg Hx    Inflammatory bowel disease Neg Hx  Liver disease Neg Hx    Pancreatic cancer Neg Hx      ROS:  Please see the history of present illness.  Review  of Systems  Constitutional:  Negative for chills and fever.  HENT:  Negative for congestion.   Respiratory:  Positive for cough and shortness of breath.   Cardiovascular:  Positive for orthopnea (stable) and leg swelling. Negative for chest pain, palpitations and PND.  Gastrointestinal:  Negative for blood in stool, melena, nausea and vomiting.  Genitourinary:  Negative for hematuria.  Musculoskeletal:  Negative for myalgias.  Neurological:  Positive for dizziness and loss of consciousness.  Endo/Heme/Allergies:  Does not bruise/bleed easily.  Psychiatric/Behavioral:  Negative for substance abuse.    Physical Exam/Data:   Vitals:   10/09/23 0500 10/09/23 0530 10/09/23 0800 10/09/23 0914  BP: 119/82  90/67   Pulse: 82  76   Resp: 16  16   Temp:  98.1 F (36.7 C)  98.6 F (37 C)  TempSrc:  Oral  Oral  SpO2: 97%  98%   Weight:      Height:        Intake/Output Summary (Last 24 hours) at 10/09/2023 0935 Last data filed at 10/09/2023 0509 Gross per 24 hour  Intake --  Output 1000 ml  Net -1000 ml      10/08/2023    7:03 AM 09/26/2023    8:38 AM 07/30/2023   11:00 AM  Last 3 Weights  Weight (lbs) 215 lb 208 lb 215 lb 8 oz  Weight (kg) 97.523 kg 94.348 kg 97.75 kg     Body mass index is 31.75 kg/m.   General: 73 y.o. obese Caucasian male resting comfortably in no acute distress. HEENT: Normocephalic and atraumatic. Sclera clear.  Neck: Supple. No JVD. Heart: Irregular rhythm with normal rate. Radial and distal pedal pulses 2+ and equal bilaterally. Lungs: No increased work of breathing. Clear to ausculation bilaterally. No wheezes, rhonchi, or rales.  Abdomen: Soft, non-distended, and non-tender to palpation.  Extremities: 2+ pitting edema of bilateral lower extremities. Skin: Warm and dry. Neuro: Alert and oriented x3. No focal deficits. Psych: Normal affect. Responds appropriately.   EKG:  The EKG was personally reviewed and demonstrates:  sinus tachycardia, rate 105  beats minute, with known RBBB but no acute ST/T changes. Telemetry:  Telemetry was personally reviewed and demonstrates:  Normal sinus rhythm with rates in the 60s to 90s. Frequent PACs/ PVCs as well as some missed beats vs blocked PACs.  Relevant CV Studies:  Myoview 07/10/2023:   The study is normal. The study is low risk.   No ST deviation was noted.   Left ventricular function is normal. Nuclear stress EF: 70%. The left ventricular ejection fraction is hyperdynamic (>65%). End diastolic cavity size is normal.   Prior study available for comparison from 03/30/2009.  Laboratory Data:  High Sensitivity Troponin:   Recent Labs  Lab 10/08/23 1051  TROPONINIHS 20*     Chemistry Recent Labs  Lab 10/02/23 0938 10/08/23 0722 10/08/23 1051 10/09/23 0337  NA 133* 129*  --  131*  K 3.4* 3.5 4.1 2.7*  CL 91* 94*  --  96*  CO2 25 22  --  26  GLUCOSE 120* 103*  --  95  BUN 15 23  --  24*  CREATININE 0.67* 0.80  --  0.58*  CALCIUM 8.1* 8.2*  --  7.7*  MG  --   --   --  1.7  GFRNONAA  --  >  60  --  >60  ANIONGAP  --  13  --  9    Recent Labs  Lab 10/08/23 0722  PROT 6.9  ALBUMIN 3.2*  AST 99*  ALT 42  ALKPHOS 101  BILITOT 0.9   Lipids No results for input(s): "CHOL", "TRIG", "HDL", "LABVLDL", "LDLCALC", "CHOLHDL" in the last 168 hours.  Hematology Recent Labs  Lab 10/08/23 1051 10/09/23 0337  WBC 3.8* 2.9*  RBC 3.47* 3.07*  HGB 13.0 11.8*  HCT 37.0* 33.0*  MCV 106.6* 107.5*  MCH 37.5* 38.4*  MCHC 35.1 35.8  RDW 14.7 14.8  PLT 155 128*   Thyroid No results for input(s): "TSH", "FREET4" in the last 168 hours.  BNP Recent Labs  Lab 10/08/23 0722  BNP 95.0    DDimer No results for input(s): "DDIMER" in the last 168 hours.   Radiology/Studies:  CT Angio Chest PE W and/or Wo Contrast Result Date: 10/08/2023 CLINICAL DATA:  Pulmonary embolism (PE) suspected, high prob EXAM: CT ANGIOGRAPHY CHEST WITH CONTRAST TECHNIQUE: Multidetector CT imaging of the chest was  performed using the standard protocol during bolus administration of intravenous contrast. Multiplanar CT image reconstructions and MIPs were obtained to evaluate the vascular anatomy. RADIATION DOSE REDUCTION: This exam was performed according to the departmental dose-optimization program which includes automated exposure control, adjustment of the mA and/or kV according to patient size and/or use of iterative reconstruction technique. CONTRAST:  75mL OMNIPAQUE IOHEXOL 350 MG/ML SOLN COMPARISON:  CT 09/09/2023 FINDINGS: Cardiovascular: Satisfactory opacification of the pulmonary arteries to the segmental level. No evidence of pulmonary embolism. Aortic and coronary artery atherosclerosis. Thoracic aorta is nonaneurysmal. Normal heart size. No pericardial effusion. Mediastinum/Nodes: No enlarged mediastinal, hilar, or axillary lymph nodes. Thyroid gland, trachea, and esophagus demonstrate no significant findings. Lungs/Pleura: Moderate-sized layering right-sided pleural effusion with mild compressive atelectasis. Postsurgical changes from left upper lobectomy. Previously seen left-sided effusion has resolved. No new airspace consolidation. No pneumothorax. Mild emphysema. Upper Abdomen: Hepatic steatosis.  No acute findings. Musculoskeletal: No chest wall abnormality. No acute or significant osseous findings. Review of the MIP images confirms the above findings. IMPRESSION: 1. No evidence of pulmonary embolism. 2. Moderate-sized layering right-sided pleural effusion with mild compressive atelectasis. 3. Previously seen left-sided effusion has resolved. 4. Hepatic steatosis. 5. Aortic and coronary artery atherosclerosis (ICD10-I70.0). 6. Emphysema (ICD10-J43.9). Electronically Signed   By: Duanne Guess D.O.   On: 10/08/2023 13:17   DG Chest 2 View Result Date: 10/08/2023 CLINICAL DATA:  Fluid retention.  Leg swelling. EXAM: CHEST - 2 VIEW COMPARISON:  10/02/2023. FINDINGS: Redemonstration of small layering  right pleural effusion with associated probable atelectatic changes at the right lung base. No significant interval change. No pulmonary edema. Bilateral lung fields are otherwise clear. Left lateral costophrenic angle is clear. Normal cardio-mediastinal silhouette. No acute osseous abnormalities. The soft tissues are within normal limits. IMPRESSION: *Stable right pleural effusion with associated probable atelectasis. No pulmonary edema. Electronically Signed   By: Jules Schick M.D.   On: 10/08/2023 07:51     Assessment and Plan:   Acute on Chronic Diastolic CHF Right Sided Pleural Effusion Patient was recently admitted at [facility] in 08/2023 for CHF and pleural effusion. Echo showed LVEF of 55-60% with mild AI and mild TR. He was diuresed and underwent left sided thoracentesis. He now presents with worsening lower extremity edema as well as some abdominal distension. BNP normal at 95. Chest x-ray showed stable right pleural effusion with associated probable atelectasis but no edema. Chest  CTA was negative for PE but showed a moderate-sized layer right sided pleural effusion with mild compressive atelectasis but the previously seen left sided effusion had resolved. He was started on IV Lasix. 1L of documented urinary output so far.  - Mildly volume overloaded on exam. Weight was 215 lbs on admission which is up from 208 lbs at last OV on 09/26/2023 but stable from weight in 07/2023. Will ask RN to check daily weights while here. - Currently on IV Lasix 60mg  once daily. He has already received today's dose. Can reassess tomorrow. May be able to switch to oral diuretics in the next day or two. Would consider discharging on Torsemide. - He is on Irbesartan-HCTZ at home. This is currently on hold due to soft BP. Continue to hold for now. Would not restart HCTZ given significant hypokalemia. Could consider Spironolactone prior to discharge if BP will tolerate.  - Will hold off on SGLT2 right now as  well given soft BP. - Lower extremity venous dopplers are pending for further evaluation of lower extremity edema. - Continue to monitor daily weights, strict I/Os, and renal function.  Syncope Patient reports dizziness when his BP drops and states he had a syncopal episode prior to recent admission at [facility]. He states BP had dropped a little at that time. Echo during that admission showed normal LV function and no significant valvular disease. No recurrent syncope.  - BP soft but stable. Home Irbesartan-HCTZ  on hold. - Telemetry shows PACs/ PVCs as well as some dropped beats vs blocked PACs but no significant arrhythmias to explain syncope. - Will check orthostatic vital signs. - May need to consider outpatient monitor.   Hypertension BP soft but stable. - Continue to hold home Irbesartan-HCTZ as above.  Hypokalemia Potassium 3.5 >> 4.1 on admission but 2.7 today after IV Lasix. - Being repleted by primary team.   Hyponatremia Sodium 120 on admissions. Possibly due to hypervolemia. Improved to 131 today with diuresis. - Continue to monitor.  Otherwise, per primary team: - Hyperlipidemia - Macrocytic anemia - History of cancer (lung cancer, cholangiocarcinoma of biliary tract s/p Whipple in 2014, and CML) - BPH - Thrombocytopenia    Risk Assessment/Risk Scores:    New York Heart Association (NYHA) Functional Class NYHA Class II  For questions or updates, please contact [facility] Please consult [email or web address] for contact info under    Signed, Corrin Parker, PA-C  10/09/2023 9:35 AM

## 2023-10-09 NOTE — Care Management CC44 (Signed)
Condition Code 44 Documentation Completed  Patient Details  Name: Kyle Montoya MRN: [id number] Date of Birth: 07-01-1951   Condition Code 44 given:  Yes Patient signature on Condition Code 44 notice:  Yes Documentation of 2 MD's agreement:  Yes Code 44 added to claim:  Yes    Adrian Prows, RN 10/09/2023, 3:25 PM

## 2023-10-09 NOTE — ED Notes (Signed)
Hospitalist notified of low blood pressure

## 2023-10-09 NOTE — ED Notes (Signed)
Pt received breakfast tray 

## 2023-10-10 DIAGNOSIS — I951 Orthostatic hypotension: Secondary | ICD-10-CM

## 2023-10-10 DIAGNOSIS — I5031 Acute diastolic (congestive) heart failure: Secondary | ICD-10-CM | POA: Diagnosis not present

## 2023-10-10 HISTORY — DX: Orthostatic hypotension: I95.1

## 2023-10-10 LAB — IRON AND TIBC
Iron: 78 ug/dL (ref 45–182)
Saturation Ratios: 33 % (ref 17.9–39.5)
TIBC: 235 ug/dL — ABNORMAL LOW (ref 250–450)
UIBC: 157 ug/dL

## 2023-10-10 LAB — BASIC METABOLIC PANEL
Anion gap: 9 (ref 5–15)
BUN: 18 mg/dL (ref 8–23)
CO2: 24 mmol/L (ref 22–32)
Calcium: 8 mg/dL — ABNORMAL LOW (ref 8.9–10.3)
Chloride: 100 mmol/L (ref 98–111)
Creatinine, Ser: 0.57 mg/dL — ABNORMAL LOW (ref 0.61–1.24)
GFR, Estimated: >60 mL/min (ref >60)
Glucose, Bld: 91 mg/dL (ref 70–99)
Potassium: 3.4 mmol/L — ABNORMAL LOW (ref 3.5–5.1)
Sodium: 133 mmol/L — ABNORMAL LOW (ref 135–145)

## 2023-10-10 LAB — CBC
HCT: 30.9 % — ABNORMAL LOW (ref 39.0–52.0)
Hemoglobin: 11 g/dL — ABNORMAL LOW (ref 13.0–17.0)
MCH: 38.1 pg — ABNORMAL HIGH (ref 26.0–34.0)
MCHC: 35.6 g/dL (ref 30.0–36.0)
MCV: 106.9 fL — ABNORMAL HIGH (ref 80.0–100.0)
Platelets: 131 10*3/uL — ABNORMAL LOW (ref 150–400)
RBC: 2.89 MIL/uL — ABNORMAL LOW (ref 4.22–5.81)
RDW: 14.7 % (ref 11.5–15.5)
WBC: 2.1 10*3/uL — ABNORMAL LOW (ref 4.0–10.5)
nRBC: 0 % (ref 0.0–0.2)

## 2023-10-10 LAB — FOLATE: Folate: 6.5 ng/mL (ref >5.9)

## 2023-10-10 LAB — MAGNESIUM: Magnesium: 2.1 mg/dL (ref 1.7–2.4)

## 2023-10-10 LAB — RETICULOCYTES
Immature Retic Fract: 14.3 % (ref 2.3–15.9)
RBC.: 2.88 MIL/uL — ABNORMAL LOW (ref 4.22–5.81)
Retic Count, Absolute: 52.1 10*3/uL (ref 19.0–186.0)
Retic Ct Pct: 1.8 % (ref 0.4–3.1)

## 2023-10-10 LAB — VITAMIN B12: Vitamin B-12: 370 pg/mL (ref 180–914)

## 2023-10-10 LAB — FERRITIN: Ferritin: 348 ng/mL — ABNORMAL HIGH (ref 24–336)

## 2023-10-10 MED ORDER — HYDROCODONE-ACETAMINOPHEN 5-325 MG PO TABS
1.0000 | ORAL_TABLET | Freq: Four times a day (QID) | ORAL | Status: DC | PRN
  Administered 2023-10-10: 1 via ORAL
  Filled 2023-10-10: qty 1

## 2023-10-10 MED ORDER — POTASSIUM CHLORIDE CRYS ER 20 MEQ PO TBCR
40.0000 meq | EXTENDED_RELEASE_TABLET | Freq: Three times a day (TID) | ORAL | Status: DC
  Administered 2023-10-10: 40 meq via ORAL
  Filled 2023-10-10: qty 2

## 2023-10-10 MED ORDER — POTASSIUM CHLORIDE CRYS ER 20 MEQ PO TBCR
40.0000 meq | EXTENDED_RELEASE_TABLET | Freq: Once | ORAL | Status: AC
  Administered 2023-10-10: 40 meq via ORAL
  Filled 2023-10-10: qty 2

## 2023-10-10 MED ORDER — TORSEMIDE 20 MG PO TABS: 60.0000 mg | ORAL_TABLET | Freq: Every day | ORAL | 2 refills | Status: DC

## 2023-10-10 NOTE — Plan of Care (Signed)

## 2023-10-10 NOTE — Discharge Summary (Signed)
[facility]  Physician Discharge Summary   Patient ID: Kyle Montoya MRN: [id number] DOB/AGE: [birth year] 73 y.o.  Admit date: 10/08/2023 Discharge date: 10/10/2023    PCP: Lars Mage, NP  DISCHARGE DIAGNOSES:    Acute diastolic (congestive) heart failure (HCC) Hyponatremia and hypokalemia Macrocytic anemia Right-sided pleural effusion History of CML History of BPH   RECOMMENDATIONS FOR OUTPATIENT FOLLOW UP: Cardiology to schedule outpatient appointment PCP to continue to monitor right-sided pleural effusion with repeat films in a few weeks.  Consider thoracentesis if there is no improvement in the pleural effusion or if it worsens.   Home Health: None Equipment/Devices: None  CODE STATUS: Full code  DISCHARGE CONDITION: fair  Diet recommendation: As before  INITIAL HISTORY: 73 y.o. male with medical history significant for remote history of cholangiocarcinoma and lung cancer, well treated CML and chronic diastolic congestive heart failure being admitted to the hospital with recurrent acute heart failure with preserved EF that has failed outpatient management.  The patient was actually recently hospitalized at [facility] with complaints of dyspnea and bilateral lower extremity edema.  He was found to have left-sided pleural effusion that improved with thoracentesis.  He was also diuresed with IV Lasix, states that he improved and was discharged home.  After going home despite taking furosemide he experienced worsening lower extremity edema.  So he decided to come back to the hospital and this time he came to [facility].      Consultants: Cardiology   Procedures: Lower extremity venous Doppler was negative for DVT  HOSPITAL COURSE:   Acute on chronic diastolic CHF Was recently hospitalized at [facility] for acute CHF which improved after he was given IV diuretics.  Was discharged on oral furosemide.  Experienced worsening lower extremity swelling so  he came back to the hospital.  His BNP is surprisingly normal. Placed on IV furosemide.  Improvement in edema noted.  Changed over to torsemide from furosemide.  Cleared by cardiology.  Okay for discharge today. Last echocardiogram was done at [facility] on September 10, 2023 which showed EF of 55 to 60%. Strict ins and outs and daily weights.  Hypokalemia/hyponatremia Potassium supplemented.  Can be monitored in the outpatient setting.   Hypotension Avapro has been discontinued.   Macrocytic anemia Hemoglobin stable.  Anemia panel shows ferritin of 348, iron 78, TIBC 235, percent saturation 33.  Folic acid 6.5.  B12 level is 370. No evidence for overt bleeding.   Mild thrombocytopenia Stable   Moderate right-sided pleural effusion Seen on CT angiogram.  Patient mentions that he has had these effusions for a long period of time.  He denies any shortness of breath.  No cough.  No chest pain.  Continue to monitor for now. Since he is otherwise asymptomatic it is reasonable to address this in the outpatient setting.  His respiratory effort is normal and his saturations are normal.  Would recommend repeating chest x-ray in 2 to 3 weeks and if pleural effusion persists at that time a thoracentesis can be pursued.   History of CML Outpatient follow-up with medical oncology at [facility].   History of BPH Continue Flomax.   Obesity Estimated body mass index is 29.74 kg/m as calculated from the following:   Height as of this encounter: 5\' 9"  (1.753 m).   Weight as of this encounter: 91.4 kg.  Patient is stable.  Okay for discharge home today.   PERTINENT LABS:  The results of significant diagnostics from this hospitalization (including imaging, microbiology,  ancillary and laboratory) are listed below for reference.    Labs:   Basic Metabolic Panel: Recent Labs  Lab 10/08/23 0722 10/08/23 1051 10/09/23 0337 10/10/23 0432  NA 129*  --  131* 133*  K 3.5 4.1 2.7* 3.4*  CL 94*  --   96* 100  CO2 22  --  26 24  GLUCOSE 103*  --  95 91  BUN 23  --  24* 18  CREATININE 0.80  --  0.58* 0.57*  CALCIUM 8.2*  --  7.7* 8.0*  MG  --   --  1.7 2.1   Liver Function Tests: Recent Labs  Lab 10/08/23 0722  AST 99*  ALT 42  ALKPHOS 101  BILITOT 0.9  PROT 6.9  ALBUMIN 3.2*   CBC: Recent Labs  Lab 10/08/23 1051 10/09/23 0337 10/10/23 0432  WBC 3.8* 2.9* 2.1*  NEUTROABS 1.6*  --   --   HGB 13.0 11.8* 11.0*  HCT 37.0* 33.0* 30.9*  MCV 106.6* 107.5* 106.9*  PLT 155 128* 131*   BNP: BNP (last 3 results) Recent Labs    09/18/23 0000 10/08/23 0722  BNP 88.8 95.0     IMAGING STUDIES VAS [place] LOWER EXTREMITY VENOUS (DVT) Result Date: 10/09/2023  Lower Venous DVT Study Patient Name:  [name]  Date of Exam:   10/09/2023 Medical Rec #: [id number]       Accession #:    [id number] Date of Birth: 1950-09-28       Patient Gender: M Patient Age:   56 years Exam Location:  [facility] Procedure:      VAS [place] LOWER EXTREMITY VENOUS (DVT) Referring Phys: Osvaldo Shipper --------------------------------------------------------------------------------  Indications: Swelling, and Edema. Other Indications: CHF. Comparison Study: No prior exam. Performing Technologist: Fernande Bras  Examination Guidelines: A complete evaluation includes B-mode imaging, spectral Doppler, color Doppler, and power Doppler as needed of all accessible portions of each vessel. Bilateral testing is considered an integral part of a complete examination. Limited examinations for reoccurring indications may be performed as noted. The reflux portion of the exam is performed with the patient in reverse Trendelenburg.  +---------+---------------+---------+-----------+----------+--------------+ RIGHT    CompressibilityPhasicitySpontaneityPropertiesThrombus Aging +---------+---------------+---------+-----------+----------+--------------+ CFV      Full           Yes      Yes                                  +---------+---------------+---------+-----------+----------+--------------+ SFJ      Full           Yes      Yes                                 +---------+---------------+---------+-----------+----------+--------------+ FV Prox  Full                                                        +---------+---------------+---------+-----------+----------+--------------+ FV Mid   Full                                                        +---------+---------------+---------+-----------+----------+--------------+  FV DistalFull                                                        +---------+---------------+---------+-----------+----------+--------------+ PFV      Full                                                        +---------+---------------+---------+-----------+----------+--------------+ POP      Full           Yes      Yes                                 +---------+---------------+---------+-----------+----------+--------------+ PTV      Full                                                        +---------+---------------+---------+-----------+----------+--------------+ PERO     Full                                                        +---------+---------------+---------+-----------+----------+--------------+   +---------+---------------+---------+-----------+----------+--------------+ LEFT     CompressibilityPhasicitySpontaneityPropertiesThrombus Aging +---------+---------------+---------+-----------+----------+--------------+ CFV      Full           Yes      Yes                                 +---------+---------------+---------+-----------+----------+--------------+ SFJ      Full           Yes      Yes                                 +---------+---------------+---------+-----------+----------+--------------+ FV Prox  Full                                                         +---------+---------------+---------+-----------+----------+--------------+ FV Mid   Full                                                        +---------+---------------+---------+-----------+----------+--------------+ FV DistalFull                                                        +---------+---------------+---------+-----------+----------+--------------+  PFV      Full                                                        +---------+---------------+---------+-----------+----------+--------------+ POP      Full           Yes      Yes                                 +---------+---------------+---------+-----------+----------+--------------+ PTV      Full                                                        +---------+---------------+---------+-----------+----------+--------------+ PERO     Full                                                        +---------+---------------+---------+-----------+----------+--------------+     Summary: BILATERAL: - No evidence of deep vein thrombosis seen in the lower extremities, bilaterally. -No evidence of popliteal cyst, bilaterally.   *See table(s) above for measurements and observations. Electronically signed by Heath Lark on 10/09/2023 at 4:11:32 PM.    Final    CT Angio Chest PE W and/or Wo Contrast Result Date: 10/08/2023 CLINICAL DATA:  Pulmonary embolism (PE) suspected, high prob EXAM: CT ANGIOGRAPHY CHEST WITH CONTRAST TECHNIQUE: Multidetector CT imaging of the chest was performed using the standard protocol during bolus administration of intravenous contrast. Multiplanar CT image reconstructions and MIPs were obtained to evaluate the vascular anatomy. RADIATION DOSE REDUCTION: This exam was performed according to the departmental dose-optimization program which includes automated exposure control, adjustment of the mA and/or kV according to patient size and/or use of iterative reconstruction technique. CONTRAST:   75mL OMNIPAQUE IOHEXOL 350 MG/ML SOLN COMPARISON:  CT 09/09/2023 FINDINGS: Cardiovascular: Satisfactory opacification of the pulmonary arteries to the segmental level. No evidence of pulmonary embolism. Aortic and coronary artery atherosclerosis. Thoracic aorta is nonaneurysmal. Normal heart size. No pericardial effusion. Mediastinum/Nodes: No enlarged mediastinal, hilar, or axillary lymph nodes. Thyroid gland, trachea, and esophagus demonstrate no significant findings. Lungs/Pleura: Moderate-sized layering right-sided pleural effusion with mild compressive atelectasis. Postsurgical changes from left upper lobectomy. Previously seen left-sided effusion has resolved. No new airspace consolidation. No pneumothorax. Mild emphysema. Upper Abdomen: Hepatic steatosis.  No acute findings. Musculoskeletal: No chest wall abnormality. No acute or significant osseous findings. Review of the MIP images confirms the above findings. IMPRESSION: 1. No evidence of pulmonary embolism. 2. Moderate-sized layering right-sided pleural effusion with mild compressive atelectasis. 3. Previously seen left-sided effusion has resolved. 4. Hepatic steatosis. 5. Aortic and coronary artery atherosclerosis (ICD10-I70.0). 6. Emphysema (ICD10-J43.9). Electronically Signed   By: Duanne Guess D.O.   On: 10/08/2023 13:17   DG Chest 2 View Result Date: 10/08/2023 CLINICAL DATA:  Fluid retention.  Leg swelling. EXAM: CHEST - 2 VIEW COMPARISON:  10/02/2023. FINDINGS: Redemonstration of small layering right pleural effusion with associated probable atelectatic changes at the  right lung base. No significant interval change. No pulmonary edema. Bilateral lung fields are otherwise clear. Left lateral costophrenic angle is clear. Normal cardio-mediastinal silhouette. No acute osseous abnormalities. The soft tissues are within normal limits. IMPRESSION: *Stable right pleural effusion with associated probable atelectasis. No pulmonary edema.  Electronically Signed   By: Jules Schick M.D.   On: 10/08/2023 07:51    DISCHARGE EXAMINATION: See progress note from earlier today  DISPOSITION: Home  Discharge Instructions     (HEART FAILURE PATIENTS) Call MD:  Anytime you have any of the following symptoms: 1) 3 pound weight gain in 24 hours or 5 pounds in 1 week 2) shortness of breath, with or without a dry hacking cough 3) swelling in the hands, feet or stomach 4) if you have to sleep on extra pillows at night in order to breathe.   Complete by: As directed    Call MD for:  difficulty breathing, headache or visual disturbances   Complete by: As directed    Call MD for:  extreme fatigue   Complete by: As directed    Call MD for:  persistant dizziness or light-headedness   Complete by: As directed    Call MD for:  persistant nausea and vomiting   Complete by: As directed    Call MD for:  severe uncontrolled pain   Complete by: As directed    Call MD for:  temperature >100.4   Complete by: As directed    Diet - low sodium heart healthy   Complete by: As directed    Discharge instructions   Complete by: As directed    Please take your medications as prescribed. Cardiology will arrange outpatient follow up.   You were cared for by a hospitalist during your hospital stay. If you have any questions about your discharge medications or the care you received while you were in the hospital after you are discharged, you can call the unit and asked to speak with the hospitalist on call if the hospitalist that took care of you is not available. Once you are discharged, your primary care physician will handle any further medical issues. Please note that NO REFILLS for any discharge medications will be authorized once you are discharged, as it is imperative that you return to your primary care physician (or establish a relationship with a primary care physician if you do not have one) for your aftercare needs so that they can reassess your need  for medications and monitor your lab values. If you do not have a primary care physician, you can call [phone] for a physician referral.   Increase activity slowly   Complete by: As directed          Allergies as of 10/10/2023       Reactions   Pneumococcal Vaccines Swelling        Medication List     STOP taking these medications    docusate sodium 100 MG capsule Commonly known as: COLACE   furosemide 80 MG tablet Commonly known as: LASIX   irbesartan-hydrochlorothiazide 150-12.5 MG tablet Commonly known as: AVALIDE       TAKE these medications    albuterol 108 (90 Base) MCG/ACT inhaler Commonly known as: VENTOLIN HFA Inhale 2 puffs into the lungs every 6 (six) hours as needed for wheezing or shortness of breath.   allopurinol 300 MG tablet Commonly known as: ZYLOPRIM Take 300 mg by mouth daily.   aspirin EC 81 MG tablet Take 1 tablet (81  mg total) by mouth daily. Swallow whole.   CALCIUM 500 PO Take 1 tablet by mouth 3 (three) times daily.   diclofenac Sodium 1 % Gel Commonly known as: VOLTAREN Apply 2 g topically at bedtime.   fluticasone 50 MCG/ACT nasal spray Commonly known as: FLONASE Place 2 sprays into both nostrils daily.   HYDROcodone-acetaminophen 5-325 MG tablet Commonly known as: NORCO/VICODIN Take 1 tablet by mouth 4 (four) times daily as needed for moderate pain (pain score 4-6). What changed: Another medication with the same name was removed. Continue taking this medication, and follow the directions you see here.   methocarbamol 750 MG tablet Commonly known as: ROBAXIN Take 750 mg by mouth every 6 (six) hours as needed for muscle spasms.   omeprazole 40 MG capsule Commonly known as: PRILOSEC TAKE 1 CAPSULE (40 MG TOTAL) BY MOUTH 2 (TWO) TIMES DAILY BEFORE A MEAL.   potassium chloride 10 MEQ tablet Commonly known as: KLOR-CON M Take 2 tablets (20 mEq total) by mouth daily. What changed: how much to take   Sprycel 100 MG  tablet Generic drug: dasatinib TAKE 1 TABLET BY MOUTH DAILY   tamsulosin 0.4 MG Caps capsule Commonly known as: FLOMAX Take 0.8 mg by mouth daily.   torsemide 20 MG tablet Commonly known as: DEMADEX Take 3 tablets (60 mg total) by mouth daily.   Trelegy Ellipta 100-62.5-25 MCG/INH Aepb Generic drug: Fluticasone-Umeclidin-Vilant Inhale 1 puff into the lungs daily at 6 (six) AM.   Vitamin D 50 MCG (2000 UT) tablet Take 2,000 Units by mouth daily.          Follow-up Information     Flossie Dibble, NP Follow up.   Specialty: Cardiology Why: Hospital follow-up with Cardiology scheduled for 10/20/2023 at 10:05am. Please arrive 15 minutes early for check-in. If this date/ time does not work for you, please call our office to reschedule. Contact information: [address] [place] [address] [phone]                 TOTAL DISCHARGE TIME: 35 minutes  Jerlisa Diliberto Rito Ehrlich  [facility] Pager on [email or web address]  10/11/2023, 10:43 AM

## 2023-10-10 NOTE — TOC Transition Note (Signed)
Transition of Care [facility]) - Discharge Note   Patient Details  Name: Kyle Montoya MRN: [id number] Date of Birth: 11/27/1950  [facility] CM/SW Contact:  Adrian Prows, RN Phone Number: 10/10/2023, 2:54 PM   Clinical Narrative:    D/C orders received; no TOC needs.   Final next level of care: Home/Self Care Barriers to Discharge: No Barriers Identified   Patient Goals and CMS Choice Patient states their goals for this hospitalization and ongoing recovery are:: home CMS Medicare.gov Compare Post Acute Care list provided to:: Patient        Discharge Placement                       Discharge Plan and Services Additional resources added to the After Visit Summary for     Discharge Planning Services: CM Consult                                 Social Drivers of Health (SDOH) Interventions SDOH Screenings   Food Insecurity: No Food Insecurity (10/09/2023)  Housing: Low Risk  (10/09/2023)  Transportation Needs: No Transportation Needs (10/09/2023)  Utilities: Not At Risk (10/09/2023)  Social Connections: Moderately Integrated (10/09/2023)  Tobacco Use: Medium Risk (10/08/2023)     Readmission Risk Interventions    10/09/2023    3:26 PM  Readmission Risk Prevention Plan  Transportation Screening Complete  PCP or Specialist Appt within 5-7 Days Complete  Home Care Screening Complete  Medication Review (RN CM) Complete

## 2023-10-10 NOTE — Progress Notes (Signed)
Progress Note  Patient Name: Kyle Montoya Date of Encounter: 10/10/2023  Primary Cardiologist: Dr. Bing Matter  Subjective   Feeling great. Asking about discharge plan. Denies CP or SOB. Legs "are so much better." Minimal trace edema noted. No palpitations or dizziness.  Inpatient Medications    Scheduled Meds:  allopurinol  300 mg Oral Daily   aspirin EC  81 mg Oral Daily   enoxaparin (LOVENOX) injection  40 mg Subcutaneous Q24H   fluticasone  2 spray Each Nare Daily   fluticasone furoate-vilanterol  1 puff Inhalation Daily   And   umeclidinium bromide  1 puff Inhalation Daily   furosemide  60 mg Intravenous BID   pantoprazole  40 mg Oral Daily   potassium chloride  40 mEq Oral TID   tamsulosin  0.8 mg Oral Daily   Continuous Infusions:  PRN Meds: acetaminophen **OR** acetaminophen, albuterol, HYDROcodone-acetaminophen, methocarbamol, ondansetron **OR** ondansetron (ZOFRAN) IV, traZODone   Vital Signs    Vitals:   10/10/23 0556 10/10/23 0802 10/10/23 0806 10/10/23 1100  BP: 114/67   102/71  Pulse: 75   86  Resp: 17     Temp: 98.6 F (37 C)   98 F (36.7 C)  TempSrc: Oral   Oral  SpO2: 96% 95% 95% 95%  Weight:      Height:        Intake/Output Summary (Last 24 hours) at 10/10/2023 1326 Last data filed at 10/10/2023 1241 Gross per 24 hour  Intake 460 ml  Output 2175 ml  Net -1715 ml      10/09/2023    1:55 PM 10/08/2023    7:03 AM 09/26/2023    8:38 AM  Last 3 Weights  Weight (lbs) 201 lb 6.4 oz 215 lb 208 lb  Weight (kg) 91.354 kg 97.523 kg 94.348 kg     Telemetry    Largely NSR with brief Mobitz 1 AVB without sustained pauses - Personally Reviewed  Physical Exam   GEN: No acute distress.  HEENT: Normocephalic, atraumatic, sclera non-icteric. Neck: No JVD or bruits. Cardiac: RRR no murmurs, rubs, or gallops.  Respiratory: Clear to auscultation bilaterally. Breathing is unlabored. GI: Soft, nontender, non-distended, BS +x 4. MS: no  deformity. Extremities: No clubbing or cyanosis. Trace sockline edema. Distal pedal pulses are 2+ and equal bilaterally. Neuro:  AAOx3. Follows commands. Psych:  Responds to questions appropriately with a normal affect. Enthuasiastic   Labs    High Sensitivity Troponin:   Recent Labs  Lab 10/08/23 1051  TROPONINIHS 20*      Cardiac EnzymesNo results for input(s): "TROPONINI" in the last 168 hours. No results for input(s): "TROPIPOC" in the last 168 hours.   Chemistry Recent Labs  Lab 10/08/23 0722 10/08/23 1051 10/09/23 0337 10/10/23 0432  NA 129*  --  131* 133*  K 3.5 4.1 2.7* 3.4*  CL 94*  --  96* 100  CO2 22  --  26 24  GLUCOSE 103*  --  95 91  BUN 23  --  24* 18  CREATININE 0.80  --  0.58* 0.57*  CALCIUM 8.2*  --  7.7* 8.0*  PROT 6.9  --   --   --   ALBUMIN 3.2*  --   --   --   AST 99*  --   --   --   ALT 42  --   --   --   ALKPHOS 101  --   --   --   BILITOT 0.9  --   --   --  GFRNONAA >60  --  >60 >60  ANIONGAP 13  --  9 9     Hematology Recent Labs  Lab 10/08/23 1051 10/09/23 0337 10/10/23 0432  WBC 3.8* 2.9* 2.1*  RBC 3.47* 3.07* 2.89*  2.88*  HGB 13.0 11.8* 11.0*  HCT 37.0* 33.0* 30.9*  MCV 106.6* 107.5* 106.9*  MCH 37.5* 38.4* 38.1*  MCHC 35.1 35.8 35.6  RDW 14.7 14.8 14.7  PLT 155 128* 131*    BNP Recent Labs  Lab 10/08/23 0722  BNP 95.0     DDimer No results for input(s): "DDIMER" in the last 168 hours.   Radiology    VAS [place] LOWER EXTREMITY VENOUS (DVT) Result Date: 10/09/2023  Lower Venous DVT Study Patient Name:  EDWORD PRIDGEON  Date of Exam:   10/09/2023 Medical Rec #: [id number]       Accession #:    [id number] Date of Birth: 02-14-1951       Patient Gender: M Patient Age:   73 years Exam Location:  [facility] Procedure:      VAS [place] LOWER EXTREMITY VENOUS (DVT) Referring Phys: Osvaldo Shipper --------------------------------------------------------------------------------  Indications: Swelling, and Edema. Other  Indications: CHF. Comparison Study: No prior exam. Performing Technologist: Fernande Bras  Examination Guidelines: A complete evaluation includes B-mode imaging, spectral Doppler, color Doppler, and power Doppler as needed of all accessible portions of each vessel. Bilateral testing is considered an integral part of a complete examination. Limited examinations for reoccurring indications may be performed as noted. The reflux portion of the exam is performed with the patient in reverse Trendelenburg.  +---------+---------------+---------+-----------+----------+--------------+ RIGHT    CompressibilityPhasicitySpontaneityPropertiesThrombus Aging +---------+---------------+---------+-----------+----------+--------------+ CFV      Full           Yes      Yes                                 +---------+---------------+---------+-----------+----------+--------------+ SFJ      Full           Yes      Yes                                 +---------+---------------+---------+-----------+----------+--------------+ FV Prox  Full                                                        +---------+---------------+---------+-----------+----------+--------------+ FV Mid   Full                                                        +---------+---------------+---------+-----------+----------+--------------+ FV DistalFull                                                        +---------+---------------+---------+-----------+----------+--------------+ PFV      Full                                                        +---------+---------------+---------+-----------+----------+--------------+  POP      Full           Yes      Yes                                 +---------+---------------+---------+-----------+----------+--------------+ PTV      Full                                                         +---------+---------------+---------+-----------+----------+--------------+ PERO     Full                                                        +---------+---------------+---------+-----------+----------+--------------+   +---------+---------------+---------+-----------+----------+--------------+ LEFT     CompressibilityPhasicitySpontaneityPropertiesThrombus Aging +---------+---------------+---------+-----------+----------+--------------+ CFV      Full           Yes      Yes                                 +---------+---------------+---------+-----------+----------+--------------+ SFJ      Full           Yes      Yes                                 +---------+---------------+---------+-----------+----------+--------------+ FV Prox  Full                                                        +---------+---------------+---------+-----------+----------+--------------+ FV Mid   Full                                                        +---------+---------------+---------+-----------+----------+--------------+ FV DistalFull                                                        +---------+---------------+---------+-----------+----------+--------------+ PFV      Full                                                        +---------+---------------+---------+-----------+----------+--------------+ POP      Full           Yes      Yes                                 +---------+---------------+---------+-----------+----------+--------------+  PTV      Full                                                        +---------+---------------+---------+-----------+----------+--------------+ PERO     Full                                                        +---------+---------------+---------+-----------+----------+--------------+     Summary: BILATERAL: - No evidence of deep vein thrombosis seen in the lower extremities, bilaterally. -No evidence of  popliteal cyst, bilaterally.   *See table(s) above for measurements and observations. Electronically signed by Heath Lark on 10/09/2023 at 4:11:32 PM.    Final     Cardiac Studies   See scanned echo  Patient Profile     73 y.o. male coronary artery calcifications noted on prior CT scan with negative stress test in 06/2023, chronic HFpEF, ascending aortic aneurysm, hypertension, hyperlipidemia, COPD,  anemia of chronic disease, and cancer (non-small cell lung cancer, cholangiocarcinoma of biliary tract s/p Whipple in 2014, and CML), RBBB, mild AI/mild TR. Admitted 08/2023 with CHF and pleural effusion. Readmitted to [facility] with persistent LE edema and SOB. CTA neg for PE but + moderate R pleural effusion.   Assessment & Plan    1. Acute on Chronic Diastolic CHF, Right Sided Pleural Effusion - Patient was recently admitted at [facility] in 08/2023 for CHF and pleural effusion. Echo showed LVEF of 55-60% with mild AI and mild TR. He was diuresed and underwent left sided thoracentesis. He now presents with worsening lower extremity edema as well as some abdominal distension. BNP normal at 95. Chest x-ray showed stable right pleural effusion with associated probable atelectasis but no edema. Chest CTA was negative for PE but  showed a moderate-sized layer right sided pleural effusion. - started on IV Lasix 60mg  BID, modest UOP by I/O, ? Unclear accuracy of weights, not yet weighed today - home irbesartan-HCTZ on hold due to soft BP (would also try to avoid both thiazide + loop) - hold off SGLT2i, spironolactone given soft BP and focus on diuresis - will discuss DC med regimen with MD (anticipate torsemide instead of Lasix at DC per previous notes)  2. Syncope Patient reported dizziness when his BP drops and states he had a syncopal episode prior to recent admission 08/2023 at [facility]. He states BP had dropped a little at that time. Echo during that admission showed normal LV function  and no significant valvular disease. No recurrent syncope.  - BP soft but stable. Home irbesartan - Telemetry as shown PACs/ PVCs as well as brief Mobitz 1 AVB without sustained pauses - Orthostatics did demonstrate drop in BP from 124/84 to 108/73 which could explain prior episode - Per d/w Dr. Rennis Golden, consider ouptatient monitor if this recurs   3. Hypokalemia - As low as 2.7. Being repleted by primary team.    4. Hyponatremia Sodium 120 on admissions. Possibly due to hypervolemia.  - Improving  5. Aortic/coronary atherosclerosis - negative nuc 06/2023 - on ASA, can defer statin rx to primary cardiologist in OP setting   Otherwise, per primary team: - Hyperlipidemia - Macrocytic  anemia - History of cancer (lung cancer, cholangiocarcinoma of biliary tract s/p Whipple in 2014, and CML) - BPH - Thrombocytopenia  Callie, covering cardmaster, will be assisting with arranging post-hospital f/u for patient within 1-2 weeks.   For questions or updates, please contact [facility] Please consult [email or web address] for contact info under Cardiology/STEMI.  Signed, Laurann Montana, PA-C 10/10/2023, 1:26 PM

## 2023-10-10 NOTE — Progress Notes (Signed)
[facility] PROGRESS NOTE   Kyle Montoya ZOX:[id number] DOB: 03-04-1951 DOA: 10/08/2023  PCP: Lars Mage, NP  Brief History: 73 y.o. male with medical history significant for remote history of cholangiocarcinoma and lung cancer, well treated CML and chronic diastolic congestive heart failure being admitted to the hospital with recurrent acute heart failure with preserved EF that has failed outpatient management.  The patient was actually recently hospitalized at [facility] with complaints of dyspnea and bilateral lower extremity edema.  He was found to have left-sided pleural effusion that improved with thoracentesis.  He was also diuresed with IV Lasix, states that he improved and was discharged home.  After going home despite taking furosemide he experienced worsening lower extremity edema.  So he decided to come back to the hospital and this time he came to [facility].     Consultants: Cardiology  Procedures: Lower extremity venous Doppler was negative for DVT    Subjective/Interval History: Feels well.  Has been able to ambulate back and forth to the bathroom without any difficulty.  Denies any shortness of breath or chest pain currently.  Lower extremity swelling has improved.      Assessment/Plan:  Acute on chronic diastolic CHF Was recently hospitalized at [facility] for acute CHF which improved after he was given IV diuretics.  Was discharged on oral furosemide.  Experienced worsening lower extremity swelling so he came back to the hospital.  His BNP is surprisingly normal. Was started on IV furosemide.  He was noted to have low blood pressures yesterday so the evening dose was held.  Back on twice a day dosing today.  Lower extremity Dopplers negative for DVT which is reassuring. Last echocardiogram was done at [facility] on September 10, 2023 which showed EF of 55 to 60%. Strict ins and outs and daily weights. Seen by cardiology.  Await their input today.   Patient mentions that furosemide at home was not really working.  Will see if he can be changed over to an alternative diuretic such as torsemide.  Hypokalemia/hyponatremia Likely due to diuretics.  Supplemented.  Improvement in potassium level noted.  Will supplement again today.  Hypotension Noted to have low blood pressures yesterday.  Avapro was discontinued.  Nighttime dose of Lasix had to be held.  Blood pressures have improved.    Macrocytic anemia Hemoglobin stable.  Anemia panel shows ferritin of 348, iron 78, TIBC 235, percent saturation 33.  Folic acid 6.5.  B12 level is 370. No evidence for overt bleeding.  Mild thrombocytopenia Stable  Moderate right-sided pleural effusion Seen on CT angiogram.  Patient mentions that he has had these effusions for a long period of time.  He denies any shortness of breath.  No cough.  No chest pain.  Continue to monitor for now. Since he is otherwise asymptomatic it is reasonable to address this in the outpatient setting.  His respiratory effort is normal and his saturations are normal.  Would recommend repeating chest x-ray in 2 to 3 weeks and if pleural effusion persists at that time a thoracentesis can be pursued.  History of CML Outpatient follow-up with medical oncology at [facility].  History of BPH Continue Flomax.  Obesity Estimated body mass index is 29.74 kg/m as calculated from the following:   Height as of this encounter: 5\' 9"  (1.753 m).   Weight as of this encounter: 91.4 kg.   DVT Prophylaxis: Lovenox Code Status: Full code Family Communication: Discussed with patient Disposition Plan: Hopefully return home  when improved    Medications: Scheduled:  allopurinol  300 mg Oral Daily   aspirin EC  81 mg Oral Daily   enoxaparin (LOVENOX) injection  40 mg Subcutaneous Q24H   fluticasone  2 spray Each Nare Daily   fluticasone furoate-vilanterol  1 puff Inhalation Daily   And   umeclidinium bromide  1 puff Inhalation  Daily   furosemide  60 mg Intravenous BID   pantoprazole  40 mg Oral Daily   potassium chloride  40 mEq Oral TID   tamsulosin  0.8 mg Oral Daily   Continuous: UVO:[id number] **OR** acetaminophen, albuterol, HYDROcodone-acetaminophen, methocarbamol, ondansetron **OR** ondansetron (ZOFRAN) IV, traZODone    Objective:  Vital Signs  Vitals:   10/09/23 2132 10/10/23 0556 10/10/23 0802 10/10/23 0806  BP: 128/77 114/67    Pulse: 90 75    Resp: 18 17    Temp: 98.3 F (36.8 C) 98.6 F (37 C)    TempSrc: Oral Oral    SpO2: 96% 96% 95% 95%  Weight:      Height:        Intake/Output Summary (Last 24 hours) at 10/10/2023 1134 Last data filed at 10/10/2023 1023 Gross per 24 hour  Intake 460 ml  Output 1500 ml  Net -1040 ml   Filed Weights   10/08/23 0703 10/09/23 1355  Weight: 97.5 kg 91.4 kg    General appearance: Awake alert.  In no distress Resp: Clear to auscultation bilaterally.  Normal effort Cardio: S1-S2 is normal regular.  No S3-S4.  No rubs murmurs or bruit GI: Abdomen is soft.  Nontender nondistended.  Bowel sounds are present normal.  No masses organomegaly Extremities: Lower extremity edema has improved.  Lab Results:  Data Reviewed: I have personally reviewed following labs and reports of the imaging studies  CBC: Recent Labs  Lab 10/08/23 1051 10/09/23 0337 10/10/23 0432  WBC 3.8* 2.9* 2.1*  NEUTROABS 1.6*  --   --   HGB 13.0 11.8* 11.0*  HCT 37.0* 33.0* 30.9*  MCV 106.6* 107.5* 106.9*  PLT 155 128* 131*    Basic Metabolic Panel: Recent Labs  Lab 10/08/23 0722 10/08/23 1051 10/09/23 0337 10/10/23 0432  NA 129*  --  131* 133*  K 3.5 4.1 2.7* 3.4*  CL 94*  --  96* 100  CO2 22  --  26 24  GLUCOSE 103*  --  95 91  BUN 23  --  24* 18  CREATININE 0.80  --  0.58* 0.57*  CALCIUM 8.2*  --  7.7* 8.0*  MG  --   --  1.7 2.1    GFR: Estimated Creatinine Clearance: 91.9 mL/min (A) (by C-G formula based on SCr of 0.57 mg/dL (L)).  Liver  Function Tests: Recent Labs  Lab 10/08/23 0722  AST 99*  ALT 42  ALKPHOS 101  BILITOT 0.9  PROT 6.9  ALBUMIN 3.2*     Radiology Studies: VAS [place] LOWER EXTREMITY VENOUS (DVT) Result Date: 10/09/2023  Lower Venous DVT Study Patient Name:  Kyle Montoya  Date of Exam:   10/09/2023 Medical Rec #: [id number]       Accession #:    [id number] Date of Birth: 1951/07/30       Patient Gender: M Patient Age:   76 years Exam Location:  [facility] Procedure:      VAS [place] LOWER EXTREMITY VENOUS (DVT) Referring Phys: Osvaldo Shipper --------------------------------------------------------------------------------  Indications: Swelling, and Edema. Other Indications: CHF. Comparison Study: No prior exam. Performing Technologist: Salome Spotted  Fort  Examination Guidelines: A complete evaluation includes B-mode imaging, spectral Doppler, color Doppler, and power Doppler as needed of all accessible portions of each vessel. Bilateral testing is considered an integral part of a complete examination. Limited examinations for reoccurring indications may be performed as noted. The reflux portion of the exam is performed with the patient in reverse Trendelenburg.  +---------+---------------+---------+-----------+----------+--------------+ RIGHT    CompressibilityPhasicitySpontaneityPropertiesThrombus Aging +---------+---------------+---------+-----------+----------+--------------+ CFV      Full           Yes      Yes                                 +---------+---------------+---------+-----------+----------+--------------+ SFJ      Full           Yes      Yes                                 +---------+---------------+---------+-----------+----------+--------------+ FV Prox  Full                                                        +---------+---------------+---------+-----------+----------+--------------+ FV Mid   Full                                                         +---------+---------------+---------+-----------+----------+--------------+ FV DistalFull                                                        +---------+---------------+---------+-----------+----------+--------------+ PFV      Full                                                        +---------+---------------+---------+-----------+----------+--------------+ POP      Full           Yes      Yes                                 +---------+---------------+---------+-----------+----------+--------------+ PTV      Full                                                        +---------+---------------+---------+-----------+----------+--------------+ PERO     Full                                                        +---------+---------------+---------+-----------+----------+--------------+   +---------+---------------+---------+-----------+----------+--------------+  LEFT     CompressibilityPhasicitySpontaneityPropertiesThrombus Aging +---------+---------------+---------+-----------+----------+--------------+ CFV      Full           Yes      Yes                                 +---------+---------------+---------+-----------+----------+--------------+ SFJ      Full           Yes      Yes                                 +---------+---------------+---------+-----------+----------+--------------+ FV Prox  Full                                                        +---------+---------------+---------+-----------+----------+--------------+ FV Mid   Full                                                        +---------+---------------+---------+-----------+----------+--------------+ FV DistalFull                                                        +---------+---------------+---------+-----------+----------+--------------+ PFV      Full                                                         +---------+---------------+---------+-----------+----------+--------------+ POP      Full           Yes      Yes                                 +---------+---------------+---------+-----------+----------+--------------+ PTV      Full                                                        +---------+---------------+---------+-----------+----------+--------------+ PERO     Full                                                        +---------+---------------+---------+-----------+----------+--------------+     Summary: BILATERAL: - No evidence of deep vein thrombosis seen in the lower extremities, bilaterally. -No evidence of popliteal cyst, bilaterally.   *See table(s) above for measurements and observations. Electronically signed by Heath Lark on 10/09/2023 at 4:11:32 PM.    Final  LOS: 1 day   Denine Brotz [employer] on [email or web address]  10/10/2023, 11:34 AM

## 2023-10-16 ENCOUNTER — Inpatient Hospital Stay: Payer: BC Managed Care – PPO

## 2023-10-17 DIAGNOSIS — D513 Other dietary vitamin B12 deficiency anemia: Secondary | ICD-10-CM | POA: Insufficient documentation

## 2023-10-17 NOTE — Progress Notes (Signed)
 Cardiology Office Note:  .   Date:  10/20/2023  ID:  Kyle Montoya, DOB [birth year], MRN [id number] PCP: Lars Mage, NP  [facility] Providers Cardiologist:  Gypsy Balsam, MD    History of Present Illness: .   Kyle Montoya is a 73 y.o. male with a past medical history of lung cancer, CAD noted on CT imaging, HFpEF, cholangiocarcinoma, CML, tobacco abuse.  09/10/2023 echo EF 55 to 60%, mild aortic regurgitation, mild TR 07/10/2023 Lexiscan normal, low risk study  Initially establish care with Dr. Bing Matter at the behest of his PCP for aortic calcifications noted on CT imaging.  Evaluated by Dr. Bing Matter on 09/26/2023, he had recently been discharged from the hospital and he was not taking the correct dose of Lasix, still bothered by pedal edema, medications were adjusted and he was advised to follow-up in 3 months. Subsequently, he was admitted to [facility] on 10/08/2023 and discharged on 10/10/2023 for heart failure exacerbation.  His diuretic was changed from Lasix to torsemide, he was noted to have a moderate right-sided pleural effusion noted on CT angiogram however he was relatively asymptomatic from this perspective, recommendations for repeat chest x-ray in 2 to 3 weeks for evaluation.  He presents today for follow-up after recent hospitalization as outlined above.  His weight is down to 204, was previously 215.  He states he is feeling much better, has not bothered by pedal edema or abdominal edema.  Regarding his Demadex, he is taking it on an as-needed basis, if he notices any pedal edema he will take Demadex 20 mg 3 times a day.  He is not weighing himself daily, says he just cannot remember to do so.  We discussed indications of weight gain, pedal edema, abdominal edema, shortness of breath-also when to notify our office.  His PCP is already arranged for repeat chest x-ray that will be completed tomorrow, he will also have lab work tomorrow at the cancer center.  He denies chest pain, palpitations, dyspnea, pnd, orthopnea, n, v, dizziness, syncope, edema, weight gain, or early satiety.   ROS: Review of Systems  Cardiovascular:  Positive for leg swelling.  All other systems reviewed and are negative.    Studies Reviewed: .        Cardiac Studies & Procedures     STRESS TESTS  MYOCARDIAL PERFUSION IMAGING 07/10/2023  Narrative   The study is normal. The study is low risk.   No ST deviation was noted.   Left ventricular function is normal. Nuclear stress EF: 70%. The left ventricular ejection fraction is hyperdynamic (>65%). End diastolic cavity size is normal.   Prior study available for comparison from 03/30/2009.              Risk Assessment/Calculations:             Physical Exam:   VS:  BP 118/68   Pulse 90   Ht 5\' 9"  (1.753 m)   Wt 204 lb 6.4 oz (92.7 kg)   SpO2 94%   BMI 30.18 kg/m    Wt Readings from Last 3 Encounters:  10/20/23 204 lb 6.4 oz (92.7 kg)  10/09/23 201 lb 6.4 oz (91.4 kg)  09/26/23 208 lb (94.3 kg)    GEN: Well nourished, well developed in no acute distress NECK: No JVD; No carotid bruits CARDIAC: RRR, no murmurs, rubs, gallops RESPIRATORY:  Clear to auscultation without rales, wheezing or rhonchi  ABDOMEN: Soft, non-tender, non-distended EXTREMITIES:  No edema; No deformity  ASSESSMENT AND PLAN: .   HFpEF-NYHA class I, euvolemic.  Continue Demadex-as she is currently taking as needed but it is prescribed 60 mg daily.  Right pleural effusion was noted on chest x-ray, repeat chest x-ray arranged for tomorrow.  Lung sounds are clear today. He does not want to add any further medications at this time.  CAD-noted on CT imaging, most recent stress evaluation was negative.  Continue aspirin 81 mg daily.  Most recent LDL is well-controlled at 36. CML/lung cancer - follows with Dr. Gilman Buttner.  Macrocytic anemia-lab work to be completed tomorrow at the cancer center.       Dispo: Keep follow up with Dr. Bing Matter ~  2 months.   Signed, Flossie Dibble, NP

## 2023-10-18 LAB — LAB REPORT - SCANNED: EGFR: 98.9

## 2023-10-20 ENCOUNTER — Ambulatory Visit: Payer: BC Managed Care – PPO | Attending: Cardiology | Admitting: Cardiology

## 2023-10-20 ENCOUNTER — Encounter: Payer: Self-pay | Admitting: Cardiology

## 2023-10-20 VITALS — BP 118/68 | HR 90 | Ht 69.0 in | Wt 204.4 lb

## 2023-10-20 DIAGNOSIS — D539 Nutritional anemia, unspecified: Secondary | ICD-10-CM

## 2023-10-20 DIAGNOSIS — I5032 Chronic diastolic (congestive) heart failure: Secondary | ICD-10-CM | POA: Diagnosis not present

## 2023-10-20 DIAGNOSIS — J90 Pleural effusion, not elsewhere classified: Secondary | ICD-10-CM

## 2023-10-20 DIAGNOSIS — I2510 Atherosclerotic heart disease of native coronary artery without angina pectoris: Secondary | ICD-10-CM | POA: Diagnosis not present

## 2023-10-20 NOTE — Patient Instructions (Signed)
Medication Instructions:  Your physician recommends that you continue on your current medications as directed. Please refer to the Current Medication list given to you today.  *If you need a refill on your cardiac medications before your next appointment, please call your pharmacy*   Lab Work: NONE If you have labs (blood work) drawn today and your tests are completely normal, you will receive your results only by: MyChart Message (if you have MyChart) OR A paper copy in the mail If you have any lab test that is abnormal or we need to change your treatment, we will call you to review the results.   Testing/Procedures: NONE   Follow-Up: At [facility], you and your health needs are our priority.  As part of our continuing mission to provide you with exceptional heart care, we have created designated Provider Care Teams.  These Care Teams include your primary Cardiologist (physician) and Advanced Practice Providers (APPs -  Physician Assistants and Nurse Practitioners) who all work together to provide you with the care you need, when you need it.  We recommend signing up for the patient portal called "MyChart".  Sign up information is provided on this After Visit Summary.  MyChart is used to connect with patients for Virtual Visits (Telemedicine).  Patients are able to view lab/test results, encounter notes, upcoming appointments, etc.  Non-urgent messages can be sent to your provider as well.   To learn more about what you can do with MyChart, go to [email or web address].    Your next appointment:  Appt with Dr. Bing Matter 12/25/23 at 8:00    Provider:   Gypsy Balsam, MD    Other Instructions

## 2023-10-21 ENCOUNTER — Ambulatory Visit (INDEPENDENT_AMBULATORY_CARE_PROVIDER_SITE_OTHER)
Admission: RE | Admit: 2023-10-21 | Discharge: 2023-10-21 | Disposition: A | Discharge status: Home / Self Care | Payer: BC Managed Care – PPO | Source: Ambulatory Visit | Attending: Oncology | Admitting: Oncology

## 2023-10-21 ENCOUNTER — Inpatient Hospital Stay: Payer: BC Managed Care – PPO

## 2023-10-21 DIAGNOSIS — C9210 Chronic myeloid leukemia, BCR/ABL-positive, not having achieved remission: Secondary | ICD-10-CM | POA: Diagnosis present

## 2023-10-21 DIAGNOSIS — Z85118 Personal history of other malignant neoplasm of bronchus and lung: Secondary | ICD-10-CM | POA: Diagnosis not present

## 2023-10-21 DIAGNOSIS — C221 Intrahepatic bile duct carcinoma: Secondary | ICD-10-CM

## 2023-10-21 DIAGNOSIS — E538 Deficiency of other specified B group vitamins: Secondary | ICD-10-CM | POA: Diagnosis not present

## 2023-10-21 DIAGNOSIS — Z8509 Personal history of malignant neoplasm of other digestive organs: Secondary | ICD-10-CM | POA: Diagnosis not present

## 2023-10-21 LAB — CBC WITH DIFFERENTIAL (CANCER CENTER ONLY)
Abs Immature Granulocytes: 0.09 10*3/uL — ABNORMAL HIGH (ref 0.00–0.07)
Basophils Absolute: 0.1 10*3/uL (ref 0.0–0.1)
Basophils Relative: 1 %
Eosinophils Absolute: 0.1 10*3/uL (ref 0.0–0.5)
Eosinophils Relative: 1 %
HCT: 36.3 % — ABNORMAL LOW (ref 39.0–52.0)
Hemoglobin: 13.6 g/dL (ref 13.0–17.0)
Immature Granulocytes: 2 %
Lymphocytes Relative: 31 %
Lymphs Abs: 1.5 10*3/uL (ref 0.7–4.0)
MCH: 38.7 pg — ABNORMAL HIGH (ref 26.0–34.0)
MCHC: 37.5 g/dL — ABNORMAL HIGH (ref 30.0–36.0)
MCV: 103.4 fL — ABNORMAL HIGH (ref 80.0–100.0)
Monocytes Absolute: 0.8 10*3/uL (ref 0.1–1.0)
Monocytes Relative: 16 %
Neutro Abs: 2.5 10*3/uL (ref 1.7–7.7)
Neutrophils Relative %: 49 %
Platelet Count: 256 10*3/uL (ref 150–400)
RBC: 3.51 MIL/uL — ABNORMAL LOW (ref 4.22–5.81)
RDW: 14.1 % (ref 11.5–15.5)
WBC Count: 5 10*3/uL (ref 4.0–10.5)
nRBC: 0 % (ref 0.0–0.2)
nRBC: 0 /100{WBCs}

## 2023-10-21 LAB — CMP (CANCER CENTER ONLY)
ALT: 36 U/L (ref 0–44)
AST: 78 U/L — ABNORMAL HIGH (ref 15–41)
Albumin: 3.9 g/dL (ref 3.5–5.0)
Alkaline Phosphatase: 91 U/L (ref 38–126)
Anion gap: 13 (ref 5–15)
BUN: 21 mg/dL (ref 8–23)
CO2: 29 mmol/L (ref 22–32)
Calcium: 9 mg/dL (ref 8.9–10.3)
Chloride: 93 mmol/L — ABNORMAL LOW (ref 98–111)
Creatinine: 0.81 mg/dL (ref 0.61–1.24)
GFR, Estimated: >60 mL/min (ref >60)
Glucose, Bld: 120 mg/dL — ABNORMAL HIGH (ref 70–99)
Potassium: 3.5 mmol/L (ref 3.5–5.1)
Sodium: 134 mmol/L — ABNORMAL LOW (ref 135–145)
Total Bilirubin: 0.6 mg/dL (ref 0.0–1.2)
Total Protein: 7.6 g/dL (ref 6.5–8.1)

## 2023-10-21 LAB — VITAMIN B12: Vitamin B-12: 667 pg/mL (ref 180–914)

## 2023-10-21 LAB — CEA (ACCESS): CEA (CHCC): 3.33 ng/mL (ref 0.00–5.00)

## 2023-10-22 LAB — CANCER ANTIGEN 19-9: CA 19-9: 46 U/mL — ABNORMAL HIGH (ref 0–35)

## 2023-10-24 ENCOUNTER — Telehealth: Payer: Self-pay | Admitting: Pharmacy Technician

## 2023-10-24 ENCOUNTER — Other Ambulatory Visit (HOSPITAL_COMMUNITY): Payer: Self-pay

## 2023-10-24 NOTE — Telephone Encounter (Signed)
Oral Oncology Patient Advocate Encounter  Received chat from care team that patient is having issues with their refill of Sprycel. Contacted [facility] regarding this issue and was told they had asked patient to call [employer] and enroll in a copay card to cover his cost and this is the only hold up. I provided them the copay card info listed in the patient's previous pharmacy profile at [facility]. They are adding this information to his chart and will attempt to reprocess once it clears.   Once copay is sorted patient will be able to schedule fill as needed. I will check back on this Monday morning to confirm it has been done.  Jinger Neighbors, CPhT-Adv Oncology Pharmacy Patient Advocate [facility] Direct Number: [phone]  Fax: [phone]

## 2023-10-27 ENCOUNTER — Other Ambulatory Visit (HOSPITAL_COMMUNITY): Payer: Self-pay

## 2023-10-27 LAB — BCR-ABL1, CML/ALL, PCR, QUANT
E1A2 Transcript: <0.0032 %
Interpretation (BCRAL):: NEGATIVE
b2a2 transcript: <0.0032 %
b3a2 transcript: <0.0032 %

## 2023-10-27 NOTE — Telephone Encounter (Signed)
Oral Oncology Patient Advocate Encounter  Copay card provided is not processing. Called and spoke with the patient and advised him to call [employer] Access Support to enroll in a new copay card. [employer] phone [phone].   I advised him that they may need to know information about his [employer] plan during the enrollment process.  I also provided my direct line for him to call back with any issues during the process.  Once a new copay card is received the information will be provided to [facility] so that the patient can finalize their order of Sprycel.  Jinger Neighbors, CPhT-Adv Oncology Pharmacy Patient Advocate [facility] Direct Number: [phone]  Fax: [phone]

## 2023-10-27 NOTE — Telephone Encounter (Signed)
Oral Oncology Patient Advocate Encounter  Patient confirmed they have enrolled in the Sprycel copay card program. I provided them the number to [facility] patient services, [phone] and advised them to call and finish setting up their next shipment.   Patient knows to call me if there are any issues.  Jinger Neighbors, CPhT-Adv Oncology Pharmacy Patient Advocate [facility] Direct Number: [phone]  Fax: [phone]

## 2023-10-28 NOTE — Telephone Encounter (Signed)
Oral Oncology Patient Advocate Encounter  Delivery scheduled for 10/29/23.  Jinger Neighbors, CPhT-Adv Oncology Pharmacy Patient Advocate [facility] Direct Number: [phone]  Fax: [phone]

## 2023-10-30 ENCOUNTER — Other Ambulatory Visit: Payer: Self-pay | Admitting: Oncology

## 2023-10-30 ENCOUNTER — Inpatient Hospital Stay: Payer: BC Managed Care – PPO | Attending: Hematology and Oncology | Admitting: Oncology

## 2023-10-30 ENCOUNTER — Telehealth: Payer: Self-pay | Admitting: Oncology

## 2023-10-30 VITALS — BP 125/80 | HR 91 | Temp 98.0°F | Resp 18 | Ht 69.0 in | Wt 208.2 lb

## 2023-10-30 DIAGNOSIS — E538 Deficiency of other specified B group vitamins: Secondary | ICD-10-CM | POA: Diagnosis not present

## 2023-10-30 DIAGNOSIS — C3492 Malignant neoplasm of unspecified part of left bronchus or lung: Secondary | ICD-10-CM

## 2023-10-30 DIAGNOSIS — C9210 Chronic myeloid leukemia, BCR/ABL-positive, not having achieved remission: Secondary | ICD-10-CM

## 2023-10-30 DIAGNOSIS — Z79899 Other long term (current) drug therapy: Secondary | ICD-10-CM | POA: Diagnosis not present

## 2023-10-30 NOTE — Telephone Encounter (Signed)
 10/30/23 Spoke with patient and confirmed next appt.

## 2023-10-30 NOTE — Progress Notes (Signed)
 [facility]  [address] [place],  [place]  [address] [phone]  Clinic Day:10/30/23  Referring physician: Nestor Elston NOVAK, NP  ASSESSMENT & PLAN:  Assessment:   1. 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  He will continue to take dastinib 100 mg daily,  However, his recent chest CT showed bilateral pleural effusions, which are new.  As there is no history of congestive heart failure, the question is are these effusions related to his dasatinib .  He will have a chest x-ray in 3 months.  If his effusions are worse, then a change in CML therapy would need to be considered.  He denies being significantly short of breath at this time.  His CML transcript levels will be reassessed in 3 months   2. History of stage IA non-small cell lung cancer in 2016 treated with surgery alone.  His October, 2024 CT scans showed no evidence of disease recurrence.     3. History of stage IA cholangiocarcinoma in 2014 treated with surgery alone. His October, 2024 CT scans showed no evidence of disease recurrence.     4. B12 deficiency.  He was on B12 injections monthly with his primary care office. He missed his last one and so it has been over a month since his last one and yet his level was good at 667 so I told him he can stop the B-12 injections for now. His B12 level will be rechecked before his next visit.   5. Alcoholic Hepatitis. He knows he needs to quit drinking but is struggling to do so.   Plan: Patient was admitted into the hospital in January, 2025 and was found to have acute congestive heart failure. He was instructed to stop Lasix  and was placed on torsemide  20 mg and recommened follow-up with his cardiologist. He followed up with his cardiologist and received a good report from him. He had a echocardiogram in December, 2024 which was normal. I agreed with stopping Lasix  and will take that off of his med list. I instructed him to continue taking torsemide  20 mg at 3 pills  daily as instructed. He had labs done on 10/21/2023 and he had a WBC of 5.0, hemoglobin of 13.6, and platelet count of 256,000. He had a slightly low sodium of 134, a elevated AST of 78, and his potassium was low-normal at 3.5 improved from 3.4. The rest of his CMP was normal. I instructed him to stay on his oral potassium 2-3 daily and try to increase his fluids. PCR for BCR/ABL was negative. However, his CEA dropped from 7.9 to 3.33 but his CA 19.9 was elevated at 46 up from 36.  He notes not having a B-12 injection for over a month now. His B-12 level was 667 as of January, 2025 and was previously 370. I advised that he stop his B-12 injections and will recheck his levels in the future. I also provided a copy of his labs to bring to his next PCP appointment. I will see him back in 3 months with CBC, CMP, CEA, CA 19.9, and PCR for BCR/ABL done a week before his appointment with me in April. The patient understands the plans discussed today and is in agreement with them.  He knows to contact our office if he develops concerns prior to his next appointment.   I provided 20 minutes of face-to-face time during this encounter and > 50% was spent counseling as documented under my assessment and plan.  [name], MD [facility] - A DEPT OF [name] [address] [place] [address] Dept: [phone] Dept Fax: [phone]   No orders of the defined types were placed in this encounter.   CHIEF COMPLAINT:  CC: Chronic myelogenous leukemia with history of lung cancer and cholangiocarcinoma  Current Treatment: Dasatinib  100 mg daily  HISTORY OF PRESENT ILLNESS:  The patient is a 73 year old man with CML which has been kept under ideal control with dasatinib .  He comes in today for routine follow-up, as well as to go his CT scans, which were done to ensure there is no radiographic evidence of disease recurrence, as it pertains to both  his early stage lung and biliary cancers.  Since his last visit, the patient has been doing well.  He denies having any new symptoms or findings which concern him for overt signs of disease recurrence.    Chronic myelogenous leukemia diagnosed in October 2015 when he presented with leukocytosis.  He was originally placed on nilotinib, but developed a severe pruritic rash, so was switched to dasatinib  100 mg daily.  He achieved a major molecular remission by May 2016.   He also has a history of a stage IA cholangiocarcinoma, treated with surgical resection in June 2014.   He then underwent surgical resection of a stage IA (T1 N0 M0) non-small cell lung cancer in February 2016.  Pathology revealed a 3 cm, well-differentiated, adenocarcinoma with negative nodes.  Adjuvant chemotherapy for his lung cancer was not recommended.  He has chronic back pain and continues seeing spine specialist.  He apparently has significant degenerative disc disease.  He has also had upper abdominal pain, which he attributes to dasatinib .  He uses hydrocodone /APAP as needed for his pain.  In May 2018, PCR was positive at 0.012%, consistent with residual CML.  Repeat PCR in August 2018 revealed a major molecular response.  His CML has remained in remission since that time.  CT chest, abdomen and pelvis in November 2019 did not reveal evidence of recurrence of either malignancy.     CT chest in August 2020 did not reveal any evidence of recurrent lung malignancy.  He has had hypocalcemia, so was instructed to take calcium 600 mg twice daily.  When he was seen in November 2020, had been having lower leg pain worsening throughout the day.  He underwent arterial ultrasound/ankle brachial indices, which was normal.  The pain was then felt to possibly be degenerative in nature.  He reported constipation due to the calcium and as his on calcium was normal at that time,  we decreased the calcium to daily.  He had worsening neutropenia and was  found to have B12 and placed on B12 injections weekly for 4 weeks, then monthly.  Serum folate was normal.  He had persistent abdominal pain, so was referred to Dr. Charlanne.  He was seen by Dr. Charlanne in 2021 and had 2 gastric ulcers and H. Pylori infection, which was treated.  He saw Dr. Marda earlier in 2021 as well, and PSA was normal. When he was seen in August, he had recurrent hypocalcemia, with a calcium of 8.3.  He could not tolerate the calcium supplement, so we recommended that he take Tums 4 times daily.  He continued to complain of intermittent abdominal pain, which we have attributed to adhesions.  He has had rotator cuff surgery in January 2022.  He has had a colonoscopy by  Dr. Charlanne with findings of a large polyp.  He will be going back on August 8th to have this resected through colonoscopy.  He missed his appointment in May but Dr. Charlanne told him his labs were good.  I looked up and his PCR for BCR/ABL continues to be negative on the May reading.  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HCC)  02/15/2013 Initial Diagnosis   Carcinoma of biliary tract (HCC)   03/06/2013 Cancer Staging   Staging form: Distal Bile Ducts, AJCC 7th Edition - Clinical stage from 03/06/2013: Stage IA (T1, N0, M0) - Signed by Cornelius Wanda DEL, MD on 11/09/2020 Staged by: Managing physician Diagnostic confirmation: Positive histology Specimen type: Excision Histopathologic type: Cholangiocarcinoma Stage prefix: Initial diagnosis Lymph-vascular invasion (LVI): LVI not present (absent)/not identified Residual tumor (R): R0 - None Stage used in treatment planning: Yes National guidelines used in treatment planning: Yes Type of national guideline used in treatment planning: NCCN   Non-small cell carcinoma of lung (HCC)  11/08/2014 Cancer Staging   Staging form: Lung, AJCC 7th Edition - Clinical stage from 11/08/2014: Stage IA (T1b, N0, M0) - Signed by Cornelius Wanda DEL, MD on 11/09/2020 Staged by:  Managing physician Diagnostic confirmation: Positive histology Specimen type: Excision Histopathologic type: Adenocarcinoma, NOS Stage prefix: Initial diagnosis Laterality: Left Tumor size (mm): 30 Histologic grade (G): G1 Lymph-vascular invasion (LVI): LVI not present (absent)/not identified Residual tumor (R): R0 - None Pleural/elastic layer invasion: PL0 Prognostic indicators: Resection LUL Stage used in treatment planning: Yes National guidelines used in treatment planning: Yes Type of national guideline used in treatment planning: NCCN   01/31/2015 Initial Diagnosis   Non-small cell carcinoma of lung (HCC)      INTERVAL HISTORY:  Volney is here for routine follow up for chronic myelogenous leukemia with history of lung cancer and cholangiocarcinoma. Patient states that he feels well and has no complaints of pain. He endorses not smoking in the last 6 months but continues to drink some alcohol. Patient was admitted into the hospital in January, 2025 and was found to have acute congestive heart failure. He was instructed to stop Lasix  and was placed on torsemide  20 mg and recommened follow-up with his cardiologist. He followed up with his cardiologist and received a good report from him. He had a echocardiogram in December, 2024 which was normal. I agreed with stopping Lasix  and will take that off of his med list. I instructed him to continue taking torsemide  20 mg but 3 pills daily as instructed. He had labs done on 10/21/2023 and he had a WBC of 5.0, hemoglobin of 13.6, and platelet count of 256,000. He had a slightly low sodium of 134, a elevated AST of 78, and his potassium was low-normal at 3.5 improved from 3.4. The rest of his CMP was normal. I instructed him to stay on his oral potassium 2-3 daily and try to increase his fluids. PCR for BCR/ABL was negative. However, his CEA dropped from 7.9 to 3.33 but his CA 19.9 was elevated at 46 up from 36.  He notes not having a B-12 injection  for over a month now. His B-12 level was 667 as of January, 2025 and was previously 370. I advised that he stop his B-12 injections and will recheck his levels in the future. I also provided a copy of his labs to bring to his next PCP appointment. I will see him back in 3 months with CBC, CMP, CEA, CA 19.9, and PCR for BCR/ABL  done a week before his appointment with me in April. He denies signs of infection such as sore throat, sinus drainage, cough, or urinary symptoms.  He denies fevers or recurrent chills. He denies pain. He denies nausea, vomiting, chest pain, dyspnea or cough. His appetite is good and his weight has increased 7 pounds over last 3 weeks .  REVIEW OF SYSTEMS:  Review of Systems  Constitutional: Negative.  Negative for appetite change, chills, diaphoresis, fatigue, fever and unexpected weight change.  HENT:  Negative.  Negative for hearing loss, lump/mass, mouth sores, nosebleeds, sore throat, tinnitus, trouble swallowing and voice change.   Eyes: Negative.  Negative for eye problems and icterus.  Respiratory: Negative.  Negative for chest tightness, cough, hemoptysis, shortness of breath and wheezing.   Cardiovascular: Negative.  Negative for chest pain, leg swelling and palpitations.  Gastrointestinal: Negative.  Negative for abdominal distention, abdominal pain, blood in stool, constipation, diarrhea, nausea, rectal pain and vomiting.  Endocrine: Negative.   Genitourinary: Negative.  Negative for bladder incontinence, difficulty urinating, dyspareunia, dysuria, frequency, hematuria, nocturia, pelvic pain and penile discharge.   Musculoskeletal: Negative.  Negative for arthralgias, back pain, flank pain, gait problem, myalgias, neck pain and neck stiffness.  Skin: Negative.  Negative for itching, rash and wound.  Neurological: Negative.  Negative for dizziness, extremity weakness, gait problem, headaches, light-headedness, numbness, seizures and speech difficulty.  Hematological:  Negative.  Negative for adenopathy. Does not bruise/bleed easily.  Psychiatric/Behavioral: Negative.  Negative for confusion, decreased concentration, depression, sleep disturbance and suicidal ideas. The patient is not nervous/anxious.   All other systems reviewed and are negative.    VITALS:  Blood pressure 125/80, pulse 91, temperature 98 F (36.7 C), temperature source Oral, resp. rate 18, height 5' 9 (1.753 m), weight 208 lb 3.2 oz (94.4 kg), SpO2 98%.  Wt Readings from Last 3 Encounters:  10/30/23 208 lb 3.2 oz (94.4 kg)  10/20/23 204 lb 6.4 oz (92.7 kg)  10/09/23 201 lb 6.4 oz (91.4 kg)    Body mass index is 30.75 kg/m.  Performance status (ECOG): 0 - Asymptomatic  PHYSICAL EXAM:  Physical Exam Vitals and nursing note reviewed.  Constitutional:      General: He is not in acute distress.    Appearance: Normal appearance. He is normal weight. He is not ill-appearing, toxic-appearing or diaphoretic.  HENT:     Head: Normocephalic and atraumatic.     Right Ear: Tympanic membrane, ear canal and external ear normal. There is no impacted cerumen.     Left Ear: Tympanic membrane, ear canal and external ear normal. There is no impacted cerumen.     Nose: Nose normal. No congestion or rhinorrhea.     Mouth/Throat:     Mouth: Mucous membranes are moist.     Pharynx: Oropharynx is clear. No oropharyngeal exudate or posterior oropharyngeal erythema.  Eyes:     General: No scleral icterus.       Right eye: No discharge.        Left eye: No discharge.     Extraocular Movements: Extraocular movements intact.     Conjunctiva/sclera: Conjunctivae normal.     Pupils: Pupils are equal, round, and reactive to light.  Neck:     Vascular: No carotid bruit.  Cardiovascular:     Rate and Rhythm: Normal rate and regular rhythm.     Pulses: Normal pulses.     Heart sounds: Normal heart sounds. No murmur heard.    No friction rub. No gallop.  Pulmonary:     Effort: Pulmonary effort is  normal. No respiratory distress.     Breath sounds: Normal breath sounds. No stridor. No wheezing, rhonchi or rales.  Chest:     Chest wall: No tenderness.  Abdominal:     General: Bowel sounds are normal. There is no distension.     Palpations: Abdomen is soft. There is no hepatomegaly, splenomegaly or mass.     Tenderness: There is abdominal tenderness in the right upper quadrant. There is no right CVA tenderness, left CVA tenderness, guarding or rebound.     Hernia: No hernia is present.     Comments: Tenderness in the right upper quadrant Mild hepatomegaly  Musculoskeletal:        General: No swelling, tenderness, deformity or signs of injury. Normal range of motion.     Cervical back: Normal range of motion and neck supple. No rigidity or tenderness.     Right lower leg: No edema.     Left lower leg: No edema.  Lymphadenopathy:     Cervical: No cervical adenopathy.  Skin:    General: Skin is warm and dry.     Coloration: Skin is not jaundiced or pale.     Findings: No bruising, erythema, lesion or rash.  Neurological:     General: No focal deficit present.     Mental Status: He is alert and oriented to person, place, and time. Mental status is at baseline.     Cranial Nerves: No cranial nerve deficit.     Sensory: No sensory deficit.     Motor: No weakness.     Coordination: Coordination normal.     Gait: Gait normal.     Deep Tendon Reflexes: Reflexes normal.  Psychiatric:        Mood and Affect: Mood normal.        Behavior: Behavior normal.        Thought Content: Thought content normal.        Judgment: Judgment normal.    LABS:      Latest Ref Rng & Units 10/31/2023   12:00 AM 10/21/2023    8:39 AM 10/10/2023    4:32 AM  CBC  WBC  5.5     5.0  2.1   Hemoglobin 13.5 - 17.5 13.1     13.6  11.0   Hematocrit 41 - 53 38     36.3  30.9   Platelets 150 - 400 K/uL 217     256  131      This result is from an external source.      Latest Ref Rng & Units 10/31/2023    12:00 AM 10/21/2023    8:39 AM 10/10/2023    4:32 AM  CMP  Glucose 70 - 99 mg/dL  879  91   BUN 4 - 21 38     21  18   Creatinine 0.6 - 1.3 0.9     0.81  0.57   Sodium 137 - 147 129     134  133   Potassium 3.5 - 5.1 mEq/L 3.4     3.5  3.4   Chloride 99 - 108 92     93  100   CO2 13 - 22 24     29  24    Calcium 8.7 - 10.7 8.0     9.0  8.0   Total Protein 6.5 - 8.1 g/dL  7.6    Total Bilirubin 0.0 - 1.2  mg/dL  0.6    Alkaline Phos 25 - 125 85     91    AST 14 - 40 83     78    ALT 10 - 40 U/L 38     36       This result is from an external source.   Lab Results  Component Value Date   CEA1 7.9 (H) 07/17/2023   CEA 3.33 10/21/2023   /  CEA  Date Value Ref Range Status  07/17/2023 7.9 (H) 0.0 - 4.7 ng/mL Final    Comment:    (NOTE)                             Nonsmokers          <3.9                             Smokers             <5.6 Roche [employer] Electrochemiluminescence Immunoassay (ECLIA) Values obtained with different assay methods or kits cannot be used interchangeably.  Results cannot be interpreted as absolute evidence of the presence or absence of malignant disease. Performed At: [facility] [address] [place], [place] [id number] Jennette Shorter MD Ey:[id number]    CEA ([facility])  Date Value Ref Range Status  10/21/2023 3.33 0.00 - 5.00 ng/mL Final    Comment:    (NOTE) This test was performed using Beck[employer] Coulter's paramagnetic chemiluminescent immunoassay. Values obtained from different assay methods cannot be used interchangeably. Please note that up to 8% of patients who smoke may see values 5.1-10.0 ng/ml and 1% of patients who smoke may see CEA levels >10.0 ng/ml. Performed at [facility], [address] [place], [place] [address]    Lab Results  Component Value Date   [id number] (H) 10/21/2023   STUDIES:  EXAM: 10/21/2023 CHEST - 2 VIEW IMPRESSION: Stable small right pleural effusion and mild right basilar  atelectasis. No new or progressive disease.  EXAM: 07/17/2023 CT CHEST, ABDOMEN, AND PELVIS WITH CONTRAST  IMPRESSION: 1. Status post left upper lobectomy, without recurrent or metastatic disease. 2. Since 04/29/2023 abdominal CT, persistent or recurrent right lower lobe ground-glass with anterior right lower lobe clustered nodularity. All favored to be infectious. 3. New bilateral pleural effusions and trace left abdominal ascites, suggesting fluid overload. 4. Status post Whipple procedure without acute superimposed process. 5. Incidental findings, including: Coronary artery atherosclerosis. Aortic Atherosclerosis (ICD10-I70.0). Possible constipation.   HISTORY:   Allergies:  Allergies  Allergen Reactions   Pneumococcal Vaccines Swelling    Current Medications: Current Outpatient Medications  Medication Sig Dispense Refill   furosemide  (LASIX ) 80 MG tablet Take 80 mg by mouth 2 (two) times daily.     albuterol  (PROVENTIL  HFA;VENTOLIN  HFA) 108 (90 BASE) MCG/ACT inhaler Inhale 2 puffs into the lungs every 6 (six) hours as needed for wheezing or shortness of breath.     allopurinol  (ZYLOPRIM ) 300 MG tablet Take 300 mg by mouth daily.     aspirin  EC 81 MG tablet Take 1 tablet (81 mg total) by mouth daily. Swallow whole.     Calcium Carbonate (CALCIUM 500 PO) Take 1 tablet by mouth 3 (three) times daily.     Cholecalciferol (VITAMIN D ) 50 MCG (2000 UT) tablet Take 2,000 Units by mouth daily.     diclofenac Sodium (VOLTAREN) 1 % GEL Apply  2 g topically at bedtime.     fluticasone  (FLONASE ) 50 MCG/ACT nasal spray Place 2 sprays into both nostrils daily.     HYDROcodone -acetaminophen  (NORCO/VICODIN) 5-325 MG tablet Take 1 tablet by mouth 4 (four) times daily as needed for moderate pain (pain score 4-6).     irbesartan -hydrochlorothiazide  (AVALIDE) 150-12.5 MG tablet Take 1 tablet by mouth as needed (for bp over 140/80).     KLOR-CON  M10 10 MEQ tablet TAKE 1 TABLET BY MOUTH EVERY DAY  30 tablet 5   methocarbamol  (ROBAXIN ) 750 MG tablet Take 750 mg by mouth every 6 (six) hours as needed for muscle spasms.     omeprazole  (PRILOSEC) 40 MG capsule TAKE 1 CAPSULE (40 MG TOTAL) BY MOUTH 2 (TWO) TIMES DAILY BEFORE A MEAL. 180 capsule 3   SPRYCEL  100 MG tablet TAKE 1 TABLET BY MOUTH DAILY 30 tablet 5   tamsulosin  (FLOMAX ) 0.4 MG CAPS capsule Take 0.8 mg by mouth daily.     torsemide  (DEMADEX ) 20 MG tablet Take 3 tablets (60 mg total) by mouth daily. 90 tablet 3   TRELEGY ELLIPTA  100-62.5-25 MCG/INH AEPB Inhale 1 puff into the lungs daily at 6 (six) AM.     No current facility-administered medications for this visit.    I,Jasmine M Lassiter,acting as a scribe for [name], MD.,have documented all relevant documentation on the behalf of [name], MD,as directed by  [name], MD while in the presence of [name], MD.  I have reviewed this report as typed by the medical scribe, and it is complete and accurate.

## 2023-10-31 DIAGNOSIS — J90 Pleural effusion, not elsewhere classified: Secondary | ICD-10-CM | POA: Insufficient documentation

## 2023-10-31 DIAGNOSIS — I5032 Chronic diastolic (congestive) heart failure: Secondary | ICD-10-CM | POA: Diagnosis not present

## 2023-10-31 DIAGNOSIS — J4520 Mild intermittent asthma, uncomplicated: Secondary | ICD-10-CM | POA: Diagnosis not present

## 2023-10-31 DIAGNOSIS — I10 Essential (primary) hypertension: Secondary | ICD-10-CM | POA: Diagnosis not present

## 2023-10-31 LAB — HEPATIC FUNCTION PANEL
ALT: 38 U/L (ref 10–40)
AST: 83 — AB (ref 14–40)
Alkaline Phosphatase: 85 (ref 25–125)
Bilirubin, Total: 0.6

## 2023-10-31 LAB — COMPREHENSIVE METABOLIC PANEL
Albumin: 3.5 (ref 3.5–5.0)
Calcium: 8 — AB (ref 8.7–10.7)
Globulin: 3.6
eGFR: 90.5

## 2023-10-31 LAB — BASIC METABOLIC PANEL
BUN: 38 — AB (ref 4–21)
CO2: 24 — AB (ref 13–22)
Chloride: 92 — AB (ref 99–108)
Creatinine: 0.9 (ref 0.6–1.3)
Glucose: 72
Potassium: 3.4 meq/L — AB (ref 3.5–5.1)
Sodium: 129 — AB (ref 137–147)

## 2023-10-31 LAB — CBC AND DIFFERENTIAL
HCT: 38 — AB (ref 41–53)
Hemoglobin: 13.1 — AB (ref 13.5–17.5)
Neutrophils Absolute: 2.54
Platelets: 217 10*3/uL (ref 150–400)
WBC: 5.5

## 2023-10-31 LAB — CBC: RBC: 3.42 — AB (ref 3.87–5.11)

## 2023-11-04 ENCOUNTER — Telehealth: Payer: Self-pay | Admitting: Cardiology

## 2023-11-04 DIAGNOSIS — E876 Hypokalemia: Secondary | ICD-10-CM

## 2023-11-04 LAB — MAGNESIUM: Magnesium: 1.81

## 2023-11-04 NOTE — Telephone Encounter (Signed)
*  STAT* If patient is at the pharmacy, call can be transferred to refill team.   1. Which medications need to be refilled? (please list name of each medication and dose if known) potassium chloride (KLOR-CON M) 10 MEQ tablet   torsemide (DEMADEX) 20 MG tablet   2. Which pharmacy/location (including street and city if local pharmacy) is medication to be sent to?  [id number] - [place], [place] - [address]    3. Do they need a 30 day or 90 day supply? 90

## 2023-11-05 ENCOUNTER — Other Ambulatory Visit: Payer: Self-pay

## 2023-11-05 DIAGNOSIS — E876 Hypokalemia: Secondary | ICD-10-CM

## 2023-11-05 MED ORDER — POTASSIUM CHLORIDE CRYS ER 10 MEQ PO TBCR: 20.0000 meq | EXTENDED_RELEASE_TABLET | Freq: Every day | ORAL | 3 refills | Status: DC

## 2023-11-05 MED ORDER — TORSEMIDE 20 MG PO TABS: 60.0000 mg | ORAL_TABLET | Freq: Every day | ORAL | 3 refills | Status: DC

## 2023-11-06 ENCOUNTER — Other Ambulatory Visit: Payer: Self-pay | Admitting: Oncology

## 2023-11-06 ENCOUNTER — Encounter: Payer: Self-pay | Admitting: Oncology

## 2023-11-06 DIAGNOSIS — E876 Hypokalemia: Secondary | ICD-10-CM

## 2023-11-12 ENCOUNTER — Other Ambulatory Visit: Payer: Self-pay | Admitting: Physician Assistant

## 2023-11-21 ENCOUNTER — Other Ambulatory Visit: Payer: Self-pay | Admitting: Gastroenterology

## 2023-12-02 ENCOUNTER — Inpatient Hospital Stay (HOSPITAL_COMMUNITY)
Admission: EM | Admit: 2023-12-02 | Discharge: 2023-12-11 | DRG: 291 | Disposition: A | Discharge status: Home Health | Attending: Internal Medicine | Admitting: Internal Medicine

## 2023-12-02 ENCOUNTER — Inpatient Hospital Stay (HOSPITAL_COMMUNITY)

## 2023-12-02 ENCOUNTER — Other Ambulatory Visit: Payer: Self-pay

## 2023-12-02 ENCOUNTER — Encounter (HOSPITAL_COMMUNITY): Payer: Self-pay

## 2023-12-02 ENCOUNTER — Emergency Department (HOSPITAL_COMMUNITY)

## 2023-12-02 DIAGNOSIS — R609 Edema, unspecified: Secondary | ICD-10-CM | POA: Diagnosis not present

## 2023-12-02 DIAGNOSIS — I2510 Atherosclerotic heart disease of native coronary artery without angina pectoris: Secondary | ICD-10-CM | POA: Diagnosis present

## 2023-12-02 DIAGNOSIS — T502X5A Adverse effect of carbonic-anhydrase inhibitors, benzothiadiazides and other diuretics, initial encounter: Secondary | ICD-10-CM | POA: Diagnosis not present

## 2023-12-02 DIAGNOSIS — F1010 Alcohol abuse, uncomplicated: Secondary | ICD-10-CM | POA: Diagnosis present

## 2023-12-02 DIAGNOSIS — C9210 Chronic myeloid leukemia, BCR/ABL-positive, not having achieved remission: Secondary | ICD-10-CM | POA: Diagnosis present

## 2023-12-02 DIAGNOSIS — M109 Gout, unspecified: Secondary | ICD-10-CM | POA: Diagnosis present

## 2023-12-02 DIAGNOSIS — E861 Hypovolemia: Secondary | ICD-10-CM | POA: Diagnosis present

## 2023-12-02 DIAGNOSIS — N400 Enlarged prostate without lower urinary tract symptoms: Secondary | ICD-10-CM | POA: Diagnosis present

## 2023-12-02 DIAGNOSIS — Z7982 Long term (current) use of aspirin: Secondary | ICD-10-CM

## 2023-12-02 DIAGNOSIS — I739 Peripheral vascular disease, unspecified: Secondary | ICD-10-CM | POA: Diagnosis present

## 2023-12-02 DIAGNOSIS — E872 Acidosis, unspecified: Secondary | ICD-10-CM | POA: Diagnosis present

## 2023-12-02 DIAGNOSIS — R579 Shock, unspecified: Secondary | ICD-10-CM

## 2023-12-02 DIAGNOSIS — Z887 Allergy status to serum and vaccine status: Secondary | ICD-10-CM

## 2023-12-02 DIAGNOSIS — N1831 Chronic kidney disease, stage 3a: Secondary | ICD-10-CM | POA: Diagnosis present

## 2023-12-02 DIAGNOSIS — J4489 Other specified chronic obstructive pulmonary disease: Secondary | ICD-10-CM | POA: Diagnosis present

## 2023-12-02 DIAGNOSIS — I130 Hypertensive heart and chronic kidney disease with heart failure and stage 1 through stage 4 chronic kidney disease, or unspecified chronic kidney disease: Secondary | ICD-10-CM | POA: Diagnosis present

## 2023-12-02 DIAGNOSIS — I10 Essential (primary) hypertension: Secondary | ICD-10-CM | POA: Diagnosis present

## 2023-12-02 DIAGNOSIS — F1020 Alcohol dependence, uncomplicated: Secondary | ICD-10-CM | POA: Diagnosis present

## 2023-12-02 DIAGNOSIS — E871 Hypo-osmolality and hyponatremia: Principal | ICD-10-CM | POA: Diagnosis present

## 2023-12-02 DIAGNOSIS — E876 Hypokalemia: Secondary | ICD-10-CM | POA: Diagnosis present

## 2023-12-02 DIAGNOSIS — I5023 Acute on chronic systolic (congestive) heart failure: Secondary | ICD-10-CM | POA: Diagnosis present

## 2023-12-02 DIAGNOSIS — I5031 Acute diastolic (congestive) heart failure: Secondary | ICD-10-CM

## 2023-12-02 DIAGNOSIS — Z79899 Other long term (current) drug therapy: Secondary | ICD-10-CM

## 2023-12-02 DIAGNOSIS — Z9049 Acquired absence of other specified parts of digestive tract: Secondary | ICD-10-CM

## 2023-12-02 DIAGNOSIS — I480 Paroxysmal atrial fibrillation: Secondary | ICD-10-CM | POA: Diagnosis present

## 2023-12-02 DIAGNOSIS — Z8505 Personal history of malignant neoplasm of liver: Secondary | ICD-10-CM

## 2023-12-02 DIAGNOSIS — Z85118 Personal history of other malignant neoplasm of bronchus and lung: Secondary | ICD-10-CM | POA: Diagnosis not present

## 2023-12-02 DIAGNOSIS — Z8509 Personal history of malignant neoplasm of other digestive organs: Secondary | ICD-10-CM

## 2023-12-02 DIAGNOSIS — E8770 Fluid overload, unspecified: Secondary | ICD-10-CM

## 2023-12-02 DIAGNOSIS — Z825 Family history of asthma and other chronic lower respiratory diseases: Secondary | ICD-10-CM

## 2023-12-02 DIAGNOSIS — R0602 Shortness of breath: Secondary | ICD-10-CM | POA: Diagnosis not present

## 2023-12-02 DIAGNOSIS — Z87891 Personal history of nicotine dependence: Secondary | ICD-10-CM

## 2023-12-02 DIAGNOSIS — E785 Hyperlipidemia, unspecified: Secondary | ICD-10-CM | POA: Diagnosis present

## 2023-12-02 DIAGNOSIS — Z8711 Personal history of peptic ulcer disease: Secondary | ICD-10-CM

## 2023-12-02 DIAGNOSIS — N179 Acute kidney failure, unspecified: Secondary | ICD-10-CM | POA: Diagnosis present

## 2023-12-02 DIAGNOSIS — R571 Hypovolemic shock: Secondary | ICD-10-CM | POA: Diagnosis present

## 2023-12-02 DIAGNOSIS — R5381 Other malaise: Secondary | ICD-10-CM | POA: Diagnosis present

## 2023-12-02 DIAGNOSIS — Z8249 Family history of ischemic heart disease and other diseases of the circulatory system: Secondary | ICD-10-CM | POA: Diagnosis not present

## 2023-12-02 DIAGNOSIS — R531 Weakness: Secondary | ICD-10-CM | POA: Diagnosis not present

## 2023-12-02 DIAGNOSIS — I452 Bifascicular block: Secondary | ICD-10-CM | POA: Diagnosis present

## 2023-12-02 DIAGNOSIS — Z860101 Personal history of adenomatous and serrated colon polyps: Secondary | ICD-10-CM

## 2023-12-02 DIAGNOSIS — I509 Heart failure, unspecified: Secondary | ICD-10-CM | POA: Diagnosis not present

## 2023-12-02 DIAGNOSIS — I959 Hypotension, unspecified: Secondary | ICD-10-CM | POA: Diagnosis present

## 2023-12-02 DIAGNOSIS — N170 Acute kidney failure with tubular necrosis: Secondary | ICD-10-CM | POA: Diagnosis present

## 2023-12-02 DIAGNOSIS — Z8042 Family history of malignant neoplasm of prostate: Secondary | ICD-10-CM

## 2023-12-02 DIAGNOSIS — Z751 Person awaiting admission to adequate facility elsewhere: Secondary | ICD-10-CM

## 2023-12-02 DIAGNOSIS — E7800 Pure hypercholesterolemia, unspecified: Secondary | ICD-10-CM | POA: Diagnosis present

## 2023-12-02 DIAGNOSIS — Z83719 Family history of colon polyps, unspecified: Secondary | ICD-10-CM

## 2023-12-02 DIAGNOSIS — I7121 Aneurysm of the ascending aorta, without rupture: Secondary | ICD-10-CM | POA: Diagnosis present

## 2023-12-02 HISTORY — DX: Alcohol abuse, uncomplicated: F10.10

## 2023-12-02 HISTORY — DX: Hypotension, unspecified: I95.9

## 2023-12-02 HISTORY — DX: Acute kidney failure, unspecified: N17.9

## 2023-12-02 HISTORY — DX: Shock, unspecified: R57.9

## 2023-12-02 HISTORY — DX: Hypokalemia: E87.6

## 2023-12-02 HISTORY — DX: Acidosis, unspecified: E87.20

## 2023-12-02 LAB — BASIC METABOLIC PANEL
Anion gap: 22 — ABNORMAL HIGH (ref 5–15)
Anion gap: 18 — ABNORMAL HIGH (ref 5–15)
Anion gap: 17 — ABNORMAL HIGH (ref 5–15)
BUN: 99 mg/dL — ABNORMAL HIGH (ref 8–23)
BUN: 92 mg/dL — ABNORMAL HIGH (ref 8–23)
BUN: 90 mg/dL — ABNORMAL HIGH (ref 8–23)
CO2: 20 mmol/L — ABNORMAL LOW (ref 22–32)
CO2: 21 mmol/L — ABNORMAL LOW (ref 22–32)
CO2: 24 mmol/L (ref 22–32)
Calcium: 7.7 mg/dL — ABNORMAL LOW (ref 8.9–10.3)
Calcium: 7.2 mg/dL — ABNORMAL LOW (ref 8.9–10.3)
Calcium: 7.8 mg/dL — ABNORMAL LOW (ref 8.9–10.3)
Chloride: 76 mmol/L — ABNORMAL LOW (ref 98–111)
Chloride: 80 mmol/L — ABNORMAL LOW (ref 98–111)
Chloride: 81 mmol/L — ABNORMAL LOW (ref 98–111)
Creatinine, Ser: 2.98 mg/dL — ABNORMAL HIGH (ref 0.61–1.24)
Creatinine, Ser: 2.46 mg/dL — ABNORMAL HIGH (ref 0.61–1.24)
Creatinine, Ser: 2.32 mg/dL — ABNORMAL HIGH (ref 0.61–1.24)
GFR, Estimated: 21 mL/min — ABNORMAL LOW (ref >60)
GFR, Estimated: 27 mL/min — ABNORMAL LOW (ref >60)
GFR, Estimated: 29 mL/min — ABNORMAL LOW (ref >60)
Glucose, Bld: 96 mg/dL (ref 70–99)
Glucose, Bld: 156 mg/dL — ABNORMAL HIGH (ref 70–99)
Glucose, Bld: 168 mg/dL — ABNORMAL HIGH (ref 70–99)
Potassium: 3.2 mmol/L — ABNORMAL LOW (ref 3.5–5.1)
Potassium: 3.6 mmol/L (ref 3.5–5.1)
Potassium: 4 mmol/L (ref 3.5–5.1)
Sodium: 118 mmol/L — CL (ref 135–145)
Sodium: 119 mmol/L — CL (ref 135–145)
Sodium: 122 mmol/L — ABNORMAL LOW (ref 135–145)

## 2023-12-02 LAB — COMPREHENSIVE METABOLIC PANEL
ALT: 36 U/L (ref 0–44)
AST: 70 U/L — ABNORMAL HIGH (ref 15–41)
Albumin: 3.3 g/dL — ABNORMAL LOW (ref 3.5–5.0)
Alkaline Phosphatase: 71 U/L (ref 38–126)
Anion gap: 18 — ABNORMAL HIGH (ref 5–15)
BUN: 94 mg/dL — ABNORMAL HIGH (ref 8–23)
CO2: 21 mmol/L — ABNORMAL LOW (ref 22–32)
Calcium: 7.5 mg/dL — ABNORMAL LOW (ref 8.9–10.3)
Chloride: 80 mmol/L — ABNORMAL LOW (ref 98–111)
Creatinine, Ser: 2.73 mg/dL — ABNORMAL HIGH (ref 0.61–1.24)
GFR, Estimated: 24 mL/min — ABNORMAL LOW (ref >60)
Glucose, Bld: 119 mg/dL — ABNORMAL HIGH (ref 70–99)
Potassium: 3.8 mmol/L (ref 3.5–5.1)
Sodium: 119 mmol/L — CL (ref 135–145)
Total Bilirubin: 1 mg/dL (ref 0.0–1.2)
Total Protein: 6.9 g/dL (ref 6.5–8.1)

## 2023-12-02 LAB — TSH: TSH: 4.207 u[IU]/mL (ref 0.350–4.500)

## 2023-12-02 LAB — CBC
HCT: 42.5 % (ref 39.0–52.0)
HCT: 36.9 % — ABNORMAL LOW (ref 39.0–52.0)
Hemoglobin: 15.4 g/dL (ref 13.0–17.0)
Hemoglobin: 13.2 g/dL (ref 13.0–17.0)
MCH: 37.7 pg — ABNORMAL HIGH (ref 26.0–34.0)
MCH: 37.8 pg — ABNORMAL HIGH (ref 26.0–34.0)
MCHC: 36.2 g/dL — ABNORMAL HIGH (ref 30.0–36.0)
MCHC: 35.8 g/dL (ref 30.0–36.0)
MCV: 104.2 fL — ABNORMAL HIGH (ref 80.0–100.0)
MCV: 105.7 fL — ABNORMAL HIGH (ref 80.0–100.0)
Platelets: 348 10*3/uL (ref 150–400)
Platelets: 316 10*3/uL (ref 150–400)
RBC: 4.08 MIL/uL — ABNORMAL LOW (ref 4.22–5.81)
RBC: 3.49 MIL/uL — ABNORMAL LOW (ref 4.22–5.81)
RDW: 13.8 % (ref 11.5–15.5)
RDW: 13.7 % (ref 11.5–15.5)
WBC: 10.6 10*3/uL — ABNORMAL HIGH (ref 4.0–10.5)
WBC: 10.3 10*3/uL (ref 4.0–10.5)
nRBC: 0 % (ref 0.0–0.2)
nRBC: 0.2 % (ref 0.0–0.2)

## 2023-12-02 LAB — URINALYSIS, COMPLETE (UACMP) WITH MICROSCOPIC
Bacteria, UA: NONE SEEN
Bilirubin Urine: NEGATIVE
Glucose, UA: NEGATIVE mg/dL
Ketones, ur: 5 mg/dL — AB
Leukocytes,Ua: NEGATIVE
Nitrite: NEGATIVE
Protein, ur: 100 mg/dL — AB
Specific Gravity, Urine: 1.012 (ref 1.005–1.030)
pH: 6 (ref 5.0–8.0)

## 2023-12-02 LAB — OSMOLALITY
Osmolality: 291 mosm/kg (ref 275–295)
Osmolality: 300 mosm/kg — ABNORMAL HIGH (ref 275–295)

## 2023-12-02 LAB — BRAIN NATRIURETIC PEPTIDE: B Natriuretic Peptide: 182.4 pg/mL — ABNORMAL HIGH (ref 0.0–100.0)

## 2023-12-02 LAB — RESP PANEL BY RT-PCR (RSV, FLU A&B, COVID)  RVPGX2
Influenza A by PCR: NEGATIVE
Influenza B by PCR: NEGATIVE
Resp Syncytial Virus by PCR: NEGATIVE
SARS Coronavirus 2 by RT PCR: NEGATIVE

## 2023-12-02 LAB — CK: Total CK: 143 U/L (ref 49–397)

## 2023-12-02 LAB — TROPONIN I (HIGH SENSITIVITY)
Troponin I (High Sensitivity): 60 ng/L — ABNORMAL HIGH (ref <18)
Troponin I (High Sensitivity): 57 ng/L — ABNORMAL HIGH (ref <18)

## 2023-12-02 LAB — ECHOCARDIOGRAM COMPLETE
Area-P 1/2: 2.82 cm2
S' Lateral: 3.1 cm

## 2023-12-02 LAB — ETHANOL: Alcohol, Ethyl (B): <10 mg/dL (ref <10)

## 2023-12-02 LAB — OSMOLALITY, URINE: Osmolality, Ur: 272 mosm/kg — ABNORMAL LOW (ref 300–900)

## 2023-12-02 LAB — MAGNESIUM: Magnesium: 2.8 mg/dL — ABNORMAL HIGH (ref 1.7–2.4)

## 2023-12-02 LAB — SODIUM, URINE, RANDOM: Sodium, Ur: 57 mmol/L

## 2023-12-02 LAB — CREATININE, URINE, RANDOM: Creatinine, Urine: 50 mg/dL

## 2023-12-02 MED ORDER — HEPARIN SODIUM (PORCINE) 5000 UNIT/ML IJ SOLN
5000.0000 [IU] | Freq: Three times a day (TID) | INTRAMUSCULAR | Status: DC
  Administered 2023-12-02 – 2023-12-10 (×24): 5000 [IU] via SUBCUTANEOUS
  Filled 2023-12-02 (×26): qty 1

## 2023-12-02 MED ORDER — ACETAMINOPHEN 325 MG PO TABS
650.0000 mg | ORAL_TABLET | Freq: Four times a day (QID) | ORAL | Status: DC | PRN
  Administered 2023-12-03 – 2023-12-11 (×4): 650 mg via ORAL
  Filled 2023-12-02 (×4): qty 2

## 2023-12-02 MED ORDER — CHLORHEXIDINE GLUCONATE CLOTH 2 % EX PADS
6.0000 | MEDICATED_PAD | Freq: Every day | CUTANEOUS | Status: DC
  Administered 2023-12-02 – 2023-12-07 (×5): 6 via TOPICAL

## 2023-12-02 MED ORDER — THIAMINE HCL 100 MG/ML IJ SOLN
100.0000 mg | Freq: Every day | INTRAMUSCULAR | Status: DC
  Filled 2023-12-02 (×2): qty 2

## 2023-12-02 MED ORDER — FUROSEMIDE 10 MG/ML IJ SOLN
120.0000 mg | Freq: Four times a day (QID) | INTRAVENOUS | Status: AC
  Administered 2023-12-02 (×2): 120 mg via INTRAVENOUS
  Filled 2023-12-02 (×2): qty 10

## 2023-12-02 MED ORDER — NOREPINEPHRINE 4 MG/250ML-% IV SOLN
2.0000 ug/min | INTRAVENOUS | Status: DC
  Administered 2023-12-02: 2 ug/min via INTRAVENOUS
  Administered 2023-12-03: 8 ug/min via INTRAVENOUS
  Administered 2023-12-03: 10 ug/min via INTRAVENOUS
  Administered 2023-12-03: 6 ug/min via INTRAVENOUS
  Administered 2023-12-04: 5 ug/min via INTRAVENOUS
  Filled 2023-12-02 (×5): qty 250

## 2023-12-02 MED ORDER — POLYETHYLENE GLYCOL 3350 17 G PO PACK
17.0000 g | PACK | Freq: Every day | ORAL | Status: DC | PRN
  Administered 2023-12-05: 17 g via ORAL
  Filled 2023-12-02: qty 1

## 2023-12-02 MED ORDER — ONDANSETRON HCL 4 MG/2ML IJ SOLN
4.0000 mg | Freq: Four times a day (QID) | INTRAMUSCULAR | Status: DC | PRN
  Administered 2023-12-02: 4 mg via INTRAVENOUS
  Filled 2023-12-02: qty 2

## 2023-12-02 MED ORDER — THIAMINE MONONITRATE 100 MG PO TABS
100.0000 mg | ORAL_TABLET | Freq: Every day | ORAL | Status: DC
  Administered 2023-12-02 – 2023-12-11 (×10): 100 mg via ORAL
  Filled 2023-12-02 (×10): qty 1

## 2023-12-02 MED ORDER — ORAL CARE MOUTH RINSE: 15.0000 mL | OROMUCOSAL | Status: DC | PRN

## 2023-12-02 MED ORDER — SODIUM CHLORIDE 0.9 % IV BOLUS: 1000.0000 mL | Freq: Once | INTRAVENOUS | Status: DC

## 2023-12-02 MED ORDER — ALBUTEROL SULFATE (2.5 MG/3ML) 0.083% IN NEBU
2.5000 mg | INHALATION_SOLUTION | RESPIRATORY_TRACT | Status: DC | PRN
  Administered 2023-12-03: 2.5 mg via RESPIRATORY_TRACT
  Filled 2023-12-02: qty 3

## 2023-12-02 MED ORDER — MIDODRINE HCL 5 MG PO TABS
10.0000 mg | ORAL_TABLET | Freq: Once | ORAL | Status: AC
  Administered 2023-12-02: 10 mg via ORAL
  Filled 2023-12-02: qty 2

## 2023-12-02 MED ORDER — POTASSIUM CHLORIDE CRYS ER 20 MEQ PO TBCR
40.0000 meq | EXTENDED_RELEASE_TABLET | ORAL | Status: AC
  Administered 2023-12-02: 40 meq via ORAL
  Filled 2023-12-02: qty 2

## 2023-12-02 MED ORDER — ADULT MULTIVITAMIN W/MINERALS CH
1.0000 | ORAL_TABLET | Freq: Every day | ORAL | Status: DC
Start: 2023-12-02 — End: 2023-12-11
  Administered 2023-12-02 – 2023-12-11 (×10): 1 via ORAL
  Filled 2023-12-02 (×10): qty 1

## 2023-12-02 MED ORDER — ALBUMIN HUMAN 5 % IV SOLN: 25.0000 g | Freq: Once | INTRAVENOUS | Status: DC

## 2023-12-02 MED ORDER — LORAZEPAM 2 MG/ML IJ SOLN
1.0000 mg | INTRAMUSCULAR | Status: AC | PRN
  Administered 2023-12-04: 1 mg via INTRAVENOUS
  Administered 2023-12-05: 2 mg via INTRAVENOUS
  Filled 2023-12-02 (×2): qty 1

## 2023-12-02 MED ORDER — LORAZEPAM 1 MG PO TABS: 1.0000 mg | ORAL_TABLET | ORAL | Status: AC | PRN

## 2023-12-02 MED ORDER — ACETAMINOPHEN 650 MG RE SUPP: 650.0000 mg | Freq: Four times a day (QID) | RECTAL | Status: DC | PRN

## 2023-12-02 MED ORDER — DOCUSATE SODIUM 100 MG PO CAPS: 100.0000 mg | ORAL_CAPSULE | Freq: Two times a day (BID) | ORAL | Status: DC | PRN

## 2023-12-02 MED ORDER — SODIUM CHLORIDE 0.9 % IV SOLN
250.0000 mL | INTRAVENOUS | Status: AC
  Administered 2023-12-02: 250 mL via INTRAVENOUS

## 2023-12-02 MED ORDER — ARFORMOTEROL TARTRATE 15 MCG/2ML IN NEBU
15.0000 ug | INHALATION_SOLUTION | Freq: Two times a day (BID) | RESPIRATORY_TRACT | Status: DC
  Administered 2023-12-02 – 2023-12-11 (×19): 15 ug via RESPIRATORY_TRACT
  Filled 2023-12-02 (×19): qty 2

## 2023-12-02 MED ORDER — REVEFENACIN 175 MCG/3ML IN SOLN
175.0000 ug | Freq: Every day | RESPIRATORY_TRACT | Status: DC
  Administered 2023-12-02 – 2023-12-11 (×10): 175 ug via RESPIRATORY_TRACT
  Filled 2023-12-02 (×10): qty 3

## 2023-12-02 MED ORDER — ALUM & MAG HYDROXIDE-SIMETH 200-200-20 MG/5ML PO SUSP
15.0000 mL | Freq: Once | ORAL | Status: AC
  Administered 2023-12-02: 15 mL via ORAL
  Filled 2023-12-02: qty 30

## 2023-12-02 MED ORDER — FOLIC ACID 1 MG PO TABS
1.0000 mg | ORAL_TABLET | Freq: Every day | ORAL | Status: DC
  Administered 2023-12-02 – 2023-12-11 (×10): 1 mg via ORAL
  Filled 2023-12-02 (×10): qty 1

## 2023-12-02 MED ORDER — FUROSEMIDE 10 MG/ML IJ SOLN
60.0000 mg | Freq: Once | INTRAMUSCULAR | Status: AC
  Administered 2023-12-02: 60 mg via INTRAVENOUS
  Filled 2023-12-02: qty 8

## 2023-12-02 MED ORDER — MIDODRINE HCL 5 MG PO TABS
10.0000 mg | ORAL_TABLET | Freq: Three times a day (TID) | ORAL | Status: DC
  Administered 2023-12-02 – 2023-12-03 (×5): 10 mg via ORAL
  Filled 2023-12-02 (×5): qty 2

## 2023-12-02 MED ORDER — OXYCODONE HCL 5 MG PO TABS
5.0000 mg | ORAL_TABLET | Freq: Four times a day (QID) | ORAL | Status: AC | PRN
  Administered 2023-12-02 – 2023-12-06 (×4): 5 mg via ORAL
  Filled 2023-12-02 (×5): qty 1

## 2023-12-02 NOTE — ED Provider Notes (Signed)
 Emergency Department Provider Note   I have reviewed the triage vital signs and the nursing notes.   HISTORY  Chief Complaint Shortness of Breath   HPI Kyle Montoya is a 73 y.o. male with PMH of CAD, CHF, history of CML presents to the ED with return of leg swelling and SOB.  Patient states that since his discharge and mid January his legs have continued swelling.  He has been compliant with his home medications.  He denies any chest pain.  He states he called 911 tonight because he is feeling increased weakness and had to stop multiple times on the way to the bathroom to rest and catch his breath.  No productive cough or fever. No new medications.    Past Medical History:  Diagnosis Date   Abnormal colonoscopy 02/23/2021   Abnormal transaminases 02/27/2022   Acute diastolic (congestive) heart failure (HCC) 10/08/2023   Adenomatous polyp of descending colon 02/23/2021   Adverse drug reaction, initial encounter 07/04/2016   07/02/2016: Prevnar 23 vaccine given in right arm  07/02/2016: localized swelling  10/11/20167: Increase in swelling from shoulder to inner aspect of elbow.Red, warm and firm to touch.  No lip or airway swelling or stridor.  2+ brachial and radial pulses right arm.  Capillary refill < 2 seconds     Anemia in chronic illness 01/22/2013   Arthritis, degenerative 01/31/2015   Ascending aortic aneurysm (HCC) 06/27/2022   Benign fibroma of prostate 01/31/2015   BP (high blood pressure) 01/22/2013   BPH (benign prostatic hyperplasia)    CAD (coronary artery disease)    Cannot sleep 01/31/2015   Carcinoma of biliary tract (HCC) 02/15/2013   Diagnosed in 2014, Pt had whipple done at [facility], under care by Dr. Deveron Furlong Oncologist, has f/u every 6-12 months.   Cataract    bilateral sx   Cervical osteoarthritis 10/18/2014   Cholangiocarcinoma of biliary tract (HCC)    Chronic cough 01/31/2015   Chronic myelogenous leukemia (HCC)    Chronic pain 01/31/2015    CML (chronic myelocytic leukemia) (HCC) 10/18/2014   Overview:   Follows with [facility], Dr. Gilman Buttner. On dasatinib, last WBC 9.6.     Compulsive tobacco user syndrome 01/31/2015   Congestive heart failure (CHF) (HCC) 09/26/2023   COPD (chronic obstructive pulmonary disease) (HCC)    uses inhaler   COPD with asthma (HCC) 01/22/2013   Severe obstruction on spirometry FeV1 54% FeV1/FVC 51% 01/31/2015  CXR NAD 01/25/15     Diuretic-induced hypokalemia 05/23/2023   DJD (degenerative joint disease)    Dyslipidemia    Family history of colonic polyps    Family history of prostate cancer    Full thickness rotator cuff tear 08/08/2020   Gout    High coronary artery calcium score 05/30/2023   History of colon polyps 02/23/2021   History of colonic polyps 10/01/2021   IMO SNOMED Dx Update Oct 2024     HLD (hyperlipidemia) 01/31/2015   HTN (hypertension)    on meds   Hypercalcemia 08/10/2020   Hypercholesterolemia    Hypocalcemia 08/27/2021   Hyponatremia 09/19/2023   Impingement syndrome of both shoulders 03/31/2020   Lung cancer (HCC)    Myelogenous leukemia (HCC)    Non-small cell carcinoma of lung (HCC) 01/31/2015   02/13/2015 CT Chest no evidence of recurrence in lung cancer  Stage I adenocarcinoma diagnosed with left upper lobectomy in February 2016     OA (osteoarthritis)    Orthostatic hypotension 10/10/2023  PAD (peripheral artery disease) (HCC)    Personal history of gastric ulcers 02/23/2021   Renal insufficiency    Shoulder pain, left 03/29/2020   Shoulder pain, right 03/29/2020   Tendonitis    patellar   Thoracic aortic aneurysm (HCC) 06/27/2022   Wheezing 01/31/2015    Review of Systems  Constitutional: No fever/chills Cardiovascular: Denies chest pain. Respiratory: Positive shortness of breath. Gastrointestinal: No abdominal pain.  No nausea, no vomiting.  No diarrhea.  No constipation. Genitourinary: Negative for dysuria. Musculoskeletal: Negative for  back pain. Positive bilateral LE edema.  Skin: Negative for rash. Neurological: Negative for headaches.  ____________________________________________   PHYSICAL EXAM:  VITAL SIGNS: ED Triage Vitals  Encounter Vitals Group     BP 12/02/23 0159 (!) 87/62     Pulse Rate 12/02/23 0159 89     Resp 12/02/23 0159 18     Temp 12/02/23 0159 98.4 F (36.9 C)     Temp Source 12/02/23 0159 Oral     SpO2 12/02/23 0159 98 %   Constitutional: Alert and oriented. Well appearing and in no acute distress. Eyes: Conjunctivae are normal.  Head: Atraumatic. Nose: No congestion/rhinnorhea. Mouth/Throat: Mucous membranes are moist.   Neck: No stridor.   Cardiovascular: Normal rate, regular rhythm. Good peripheral circulation. Grossly normal heart sounds.   Respiratory: Normal respiratory effort.  No retractions. Lungs CTAB. Gastrointestinal: Soft and nontender. No distention.  Musculoskeletal: No gross deformities of extremities. 2+ pitting edema bilaterally.  Neurologic:  Normal speech and language.  Skin:  Skin is warm, dry and intact. No rash noted.  ____________________________________________   LABS (all labs ordered are listed, but only abnormal results are displayed)  Labs Reviewed  BASIC METABOLIC PANEL - Abnormal; Notable for the following components:      Result Value   Sodium 118 (*)    Potassium 3.2 (*)    Chloride 76 (*)    CO2 20 (*)    BUN 99 (*)    Creatinine, Ser 2.98 (*)    Calcium 7.7 (*)    GFR, Estimated 21 (*)    Anion gap 22 (*)    All other components within normal limits  CBC - Abnormal; Notable for the following components:   WBC 10.6 (*)    RBC 4.08 (*)    MCV 104.2 (*)    MCH 37.7 (*)    MCHC 36.2 (*)    All other components within normal limits  BRAIN NATRIURETIC PEPTIDE - Abnormal; Notable for the following components:   B Natriuretic Peptide 182.4 (*)    All other components within normal limits  TROPONIN I (HIGH SENSITIVITY) - Abnormal; Notable  for the following components:   Troponin I (High Sensitivity) 60 (*)    All other components within normal limits  RESP PANEL BY RT-PCR (RSV, FLU A&B, COVID)  [id number]  BASIC METABOLIC PANEL  BASIC METABOLIC PANEL  URINALYSIS, COMPLETE (UACMP) WITH MICROSCOPIC  SODIUM, URINE, RANDOM  CREATININE, URINE, RANDOM  OSMOLALITY  OSMOLALITY, URINE  TSH  TROPONIN I (HIGH SENSITIVITY)   ____________________________________________  EKG   EKG Interpretation Date/Time:  Tuesday December 02 2023 01:57:55 EDT Ventricular Rate:  92 PR Interval:  192 QRS Duration:  158 QT Interval:  406 QTC Calculation: 503 R Axis:   149  Text Interpretation: Sinus rhythm RBBB and LPFB Probable anterolateral infarct, old Similar to prior Confirmed by Alona Bene [id number] on 12/02/2023 2:15:09 AM        ____________________________________________  RADIOLOGY  DG Chest  2 View Result Date: 12/02/2023 CLINICAL DATA:  Recent pneumonia EXAM: CHEST - 2 VIEW COMPARISON:  10/21/2023 FINDINGS: Mild residual right basilar opacity, with improved aeration since 10/21/2023. Unchanged small right pleural effusion. Normal cardiomediastinal contours. IMPRESSION: 1. Mild residual right basilar opacity, with improved aeration since 10/21/2023. 2. Unchanged small right pleural effusion. Electronically Signed   By: Deatra Robinson M.D.   On: 12/02/2023 04:05    ____________________________________________   PROCEDURES  Procedure(s) performed:   Procedures  CRITICAL CARE Performed by: Maia Plan Total critical care time: 45 minutes Critical care time was exclusive of separately billable procedures and treating other patients. Critical care was necessary to treat or prevent imminent or life-threatening deterioration. Critical care was time spent personally by me on the following activities: development of treatment plan with patient and/or surrogate as well as nursing, discussions with consultants, evaluation of  patient's response to treatment, examination of patient, obtaining history from patient or surrogate, ordering and performing treatments and interventions, ordering and review of laboratory studies, ordering and review of radiographic studies, pulse oximetry and re-evaluation of patient's condition.  Alona Bene, MD Emergency Medicine  ____________________________________________   INITIAL IMPRESSION / ASSESSMENT AND PLAN / ED COURSE  Pertinent labs & imaging results that were available during my care of the patient were reviewed by me and considered in my medical decision making (see chart for details).   This patient is Presenting for Evaluation of SOB, which does require a range of treatment options, and is a complaint that involves a high risk of morbidity and mortality.  The Differential Diagnoses includes but is not exclusive to acute coronary syndrome, aortic dissection, pulmonary embolism, cardiac tamponade, community-acquired pneumonia, pericarditis, musculoskeletal chest wall pain, etc.   Critical Interventions-    Medications  acetaminophen (TYLENOL) tablet 650 mg (has no administration in time range)    Or  acetaminophen (TYLENOL) suppository 650 mg (has no administration in time range)  ondansetron (ZOFRAN) injection 4 mg (4 mg Intravenous Given 12/02/23 0402)  furosemide (LASIX) injection 60 mg (60 mg Intravenous Given 12/02/23 0342)  alum & mag hydroxide-simeth (MAALOX/MYLANTA) 200-200-20 MG/5ML suspension 15 mL (15 mLs Oral Given 12/02/23 0341)    Reassessment after intervention:  No worsening symptoms.   I decided to review pertinent External Data, and in summary patient with d/c from this hospital in January 2025 for similar issue. Improved with diuresis.   Clinical Laboratory Tests Ordered, included CBC without anemia. Na low at 118 with AKI now at 2.98.   Radiologic Tests Ordered, included CXR. I independently interpreted the images and agree with radiology  interpretation.   Cardiac Monitor Tracing which shows NSR.    Social Determinants of Health Risk NSR.   Consult complete with [facility], Dr. Arlean Hopping. Plan for admit.   Medical Decision Making: Summary:  Patient presents to the emergency department with SOB and leg swelling.  No hypoxemia at rest or increased work of breathing but with exertion patient becomes labored.  Clinically seems volume up.  Reevaluation with update and discussion with patient. Plan for admit and gentle diuresis with AKI and hypotension. He is in agreement.   Patient's presentation is most consistent with acute presentation with potential threat to life or bodily function.   Disposition: admit  ____________________________________________  FINAL CLINICAL IMPRESSION(S) / ED DIAGNOSES  Final diagnoses:  Hyponatremia  SOB (shortness of breath)  Hypervolemia, unspecified hypervolemia type    Note:  This document was prepared using Dragon voice recognition software and may include unintentional  dictation errors.  Alona Bene, MD, [facility] Emergency Medicine    Madeeha Costantino, Arlyss Repress, MD 12/02/23 [phone]

## 2023-12-02 NOTE — Consult Note (Signed)
 Nephrology Consult   Requesting provider: Virl Diamond Service requesting consult: CCM Reason for consult: AKI and hyponatremia   Assessment/Recommendations: Kyle Montoya is a/an 73 y.o. male with a past medical history Alcohol use disorder, CHF, atrial fibrillation, BPH, CAD, HTN CML, lung cancer, COPD, H LD, who presents with Heart failure exacerbation, acute severe hyponatremia, AKI, shock  AKI: Crt normal at baseline. Likely ATN. Maybe some cardiorenal -CTM closely -Old home blood pressure medications -agree with pressors given hypotension -Consider CRRT if patient fails to improve -Continue to monitor daily Cr, Dose meds for GFR -Monitor Daily I/Os, Daily weight  -Maintain MAP>65 for optimal renal perfusion.  -Avoid nephrotoxic medications including NSAIDs -Use synthetic opioids (Fentanyl/Dilaudid) if needed -RUS without obstruction  Acute heart failure exacerbation:With hyponatremia as below.  Volume excess contributing to hyponatremia.  IV Lasix today.  Avoiding thiazides.  Blood pressure support  Acute asymptomatic hyponatremia: Contributions a thiazide, volume excess, alcohol intake.  Diuresis as above.  Monitor closely today.  Consider hypertonic if the patient becomes symptomatic.  Goal sodium around 126-128 by tomorrow morning  Hypokalemia: Continue to aggressively correct as hypokalemia can make osmotic demyelination more of a concern particularly in alcoholics.  Shock: Possibly related to home blood pressure medications.  Blood pressures poor per primary team.  Alcohol use disorder: Management per primary team  Hypertension: Hold home blood pressure medications.  Will not be able to take hydrochlorothiazide again   Recommendations conveyed to primary service.    Darnell Level [facility] 12/02/2023 2:42 PM   _____________________________________________________________________________________ CC: Shortness of breath  History of Present  Illness: Kyle Montoya is a/an 73 y.o. male with a past medical history of Alcohol use disorder, CHF, atrial fibrillation, BPH, CAD, HTN CML, lung cancer, COPD, H LD, who presents with Shortness of breath.  Patient presented to the hospital yesterday for worsening shortness of breath for a couple weeks.  Noticed thatHe had worsening lower extremity edema.  He had been taking his diuretics without improvement.  Says it is been affecting his walking.  Has not had any chest pain.  He has been taking his blood pressure medications which include an ARB and thiazide.  He has been feeling more dizzy as he walks around his house.  Has not passed out.  Denies fevers, chills, cough, abdominal pain, nausea, vomiting, diarrhea.  No dysuria or hematuria.  In the emergency department he was found to have an elevated creatinine around 3 as well as a sodium of 118.  Blood pressure was also fairly low.  Attempted midodrine but hypotension persisted.   Medications:  Current Facility-Administered Medications  Medication Dose Route Frequency Provider Last Rate Last Admin   0.9 %  sodium chloride infusion  250 mL Intravenous Continuous Olalere, Adewale A, MD       acetaminophen (TYLENOL) tablet 650 mg  650 mg Oral Q6H PRN Howerter, Justin B, DO       Or   acetaminophen (TYLENOL) suppository 650 mg  650 mg Rectal Q6H PRN Howerter, Justin B, DO       albuterol (PROVENTIL) (2.5 MG/3ML) 0.083% nebulizer solution 2.5 mg  2.5 mg Nebulization Q4H PRN Bobette Mo, MD       arformoterol [facility]) nebulizer solution 15 mcg  15 mcg Nebulization BID Olalere, Adewale A, MD   15 mcg at 12/02/23 1115   docusate sodium (COLACE) capsule 100 mg  100 mg Oral BID PRN Tomma Lightning, MD       folic acid (FOLVITE)  tablet 1 mg  1 mg Oral Daily Howerter, Justin B, DO   1 mg at 12/02/23 1115   furosemide (LASIX) 120 mg in dextrose 5 % 50 mL IVPB  120 mg Intravenous Q6H Darnell Level, MD 62 mL/hr at 12/02/23 1154 120 mg at  12/02/23 1154   heparin injection 5,000 Units  5,000 Units Subcutaneous Q8H Olalere, Adewale A, MD       LORazepam (ATIVAN) tablet 1-4 mg  1-4 mg Oral Q1H PRN Howerter, Justin B, DO       Or   LORazepam (ATIVAN) injection 1-4 mg  1-4 mg Intravenous Q1H PRN Howerter, Justin B, DO       midodrine (PROAMATINE) tablet 10 mg  10 mg Oral TID WC Olalere, Adewale A, MD   10 mg at 12/02/23 1115   multivitamin with minerals tablet 1 tablet  1 tablet Oral Daily Howerter, Justin B, DO   1 tablet at 12/02/23 1115   norepinephrine (LEVOPHED) 4mg  in (0.016 mg/mL) premix infusion  2-10 mcg/min Intravenous Titrated Olalere, Adewale A, MD 7.5 mL/hr at 12/02/23 1155 2 mcg/min at 12/02/23 1155   ondansetron (ZOFRAN) injection 4 mg  4 mg Intravenous Q6H PRN Howerter, Justin B, DO   4 mg at 12/02/23 0402   polyethylene glycol (MIRALAX / GLYCOLAX) packet 17 g  17 g Oral Daily PRN Olalere, Adewale A, MD       potassium chloride SA (KLOR-CON M) CR tablet 40 mEq  40 mEq Oral Q4H Darnell Level, MD       revefenacin (YUPELRI) nebulizer solution 175 mcg  175 mcg Nebulization Daily Olalere, Adewale A, MD   175 mcg at 12/02/23 1115   thiamine (VITAMIN B1) tablet 100 mg  100 mg Oral Daily Howerter, Justin B, DO   100 mg at 12/02/23 0501   Or   thiamine (VITAMIN B1) injection 100 mg  100 mg Intravenous Daily Howerter, Justin B, DO       Current Outpatient Medications  Medication Sig Dispense Refill   albuterol (PROVENTIL HFA;VENTOLIN HFA) 108 (90 BASE) MCG/ACT inhaler Inhale 2 puffs into the lungs every 6 (six) hours as needed for wheezing or shortness of breath.     allopurinol (ZYLOPRIM) 300 MG tablet Take 300 mg by mouth daily.     aspirin EC 81 MG tablet Take 1 tablet (81 mg total) by mouth daily. Swallow whole.     Calcium Carbonate (CALCIUM 500 PO) Take 1 tablet by mouth 3 (three) times daily.     Cholecalciferol (VITAMIN D) 50 MCG (2000 UT) tablet Take 2,000 Units by mouth daily.     diclofenac Sodium  (VOLTAREN) 1 % GEL Apply 2 g topically at bedtime as needed (Pain).     fluticasone (FLONASE) 50 MCG/ACT nasal spray Place 2 sprays into both nostrils daily.     furosemide (LASIX) 80 MG tablet Take 80 mg by mouth 2 (two) times daily.     HYDROcodone-acetaminophen (NORCO/VICODIN) 5-325 MG tablet Take 1 tablet by mouth 4 (four) times daily as needed for moderate pain (pain score 4-6).     irbesartan-hydrochlorothiazide (AVALIDE) 150-12.5 MG tablet Take 1 tablet by mouth as needed (for bp over 140/80).     KLOR-CON M10 10 MEQ tablet TAKE 1 TABLET BY MOUTH EVERY DAY 30 tablet 5   methocarbamol (ROBAXIN) 750 MG tablet Take 750 mg by mouth every 6 (six) hours as needed for muscle spasms.     omeprazole (PRILOSEC) 40 MG capsule TAKE 1  CAPSULE (40 MG TOTAL) BY MOUTH 2 (TWO) TIMES DAILY BEFORE A MEAL. 180 capsule 0   SPRYCEL 100 MG tablet TAKE 1 TABLET BY MOUTH DAILY 30 tablet 5   tamsulosin (FLOMAX) 0.4 MG CAPS capsule Take 0.8 mg by mouth daily.     torsemide (DEMADEX) 20 MG tablet Take 3 tablets (60 mg total) by mouth daily. 90 tablet 3   TRELEGY ELLIPTA 100-62.5-25 MCG/INH AEPB Inhale 1 puff into the lungs daily at 6 (six) AM.       ALLERGIES Pneumococcal vaccines  MEDICAL HISTORY Past Medical History:  Diagnosis Date   Abnormal colonoscopy 02/23/2021   Abnormal transaminases 02/27/2022   Acute diastolic (congestive) heart failure (HCC) 10/08/2023   Adenomatous polyp of descending colon 02/23/2021   Adverse drug reaction, initial encounter 07/04/2016   07/02/2016: Prevnar 23 vaccine given in right arm  07/02/2016: localized swelling  10/11/20167: Increase in swelling from shoulder to inner aspect of elbow.Red, warm and firm to touch.  No lip or airway swelling or stridor.  2+ brachial and radial pulses right arm.  Capillary refill < 2 seconds     Anemia in chronic illness 01/22/2013   Arthritis, degenerative 01/31/2015   Ascending aortic aneurysm (HCC) 06/27/2022   Benign fibroma of prostate  01/31/2015   BP (high blood pressure) 01/22/2013   BPH (benign prostatic hyperplasia)    CAD (coronary artery disease)    Cannot sleep 01/31/2015   Carcinoma of biliary tract (HCC) 02/15/2013   Diagnosed in 2014, Pt had whipple done at [facility], under care by Dr. Deveron Furlong Oncologist, has f/u every 6-12 months.   Cataract    bilateral sx   Cervical osteoarthritis 10/18/2014   Cholangiocarcinoma of biliary tract (HCC)    Chronic cough 01/31/2015   Chronic myelogenous leukemia (HCC)    Chronic pain 01/31/2015   CML (chronic myelocytic leukemia) (HCC) 10/18/2014   Overview:   Follows with [facility], Dr. Gilman Buttner. On dasatinib, last WBC 9.6.     Compulsive tobacco user syndrome 01/31/2015   Congestive heart failure (CHF) (HCC) 09/26/2023   COPD (chronic obstructive pulmonary disease) (HCC)    uses inhaler   COPD with asthma (HCC) 01/22/2013   Severe obstruction on spirometry FeV1 54% FeV1/FVC 51% 01/31/2015  CXR NAD 01/25/15     Diuretic-induced hypokalemia 05/23/2023   DJD (degenerative joint disease)    Dyslipidemia    Family history of colonic polyps    Family history of prostate cancer    Full thickness rotator cuff tear 08/08/2020   Gout    High coronary artery calcium score 05/30/2023   History of colon polyps 02/23/2021   History of colonic polyps 10/01/2021   IMO SNOMED Dx Update Oct 2024     HLD (hyperlipidemia) 01/31/2015   HTN (hypertension)    on meds   Hypercalcemia 08/10/2020   Hypercholesterolemia    Hypocalcemia 08/27/2021   Hyponatremia 09/19/2023   Impingement syndrome of both shoulders 03/31/2020   Lung cancer (HCC)    Myelogenous leukemia (HCC)    Non-small cell carcinoma of lung (HCC) 01/31/2015   02/13/2015 CT Chest no evidence of recurrence in lung cancer  Stage I adenocarcinoma diagnosed with left upper lobectomy in February 2016     OA (osteoarthritis)    Orthostatic hypotension 10/10/2023   PAD (peripheral artery disease) (HCC)     Personal history of gastric ulcers 02/23/2021   Renal insufficiency    Shoulder pain, left 03/29/2020   Shoulder pain, right 03/29/2020  Tendonitis    patellar   Thoracic aortic aneurysm (HCC) 06/27/2022   Wheezing 01/31/2015     SOCIAL HISTORY Social History   Socioeconomic History   Marital status: Married    Spouse name: Not on file   Number of children: 2   Years of education: Not on file   Highest education level: Not on file  Occupational History   Occupation: environmental  Tobacco Use   Smoking status: Former    Current packs/day: 0.25    Average packs/day: 0.3 packs/day for 40.0 years (10.0 ttl pk-yrs)    Types: Cigarettes   Smokeless tobacco: Never  Vaping Use   Vaping status: Never Used  Substance and Sexual Activity   Alcohol use: Yes    Alcohol/week: 21.0 standard drinks of alcohol    Types: 21 Cans of beer per week   Drug use: Never   Sexual activity: Not on file  Other Topics Concern   Not on file  Social History Narrative   Married, 2 children.   Works moves heavy equipment x 30 yrs.   Social Drivers of Corporate investment banker Strain: Not on file  Food Insecurity: No Food Insecurity (10/09/2023)   Hunger Vital Sign    Worried About Running Out of Food in the Last Year: Never true    Ran Out of Food in the Last Year: Never true  Transportation Needs: No Transportation Needs (10/09/2023)   PRAPARE - Administrator, Civil Service (Medical): No    Lack of Transportation (Non-Medical): No  Physical Activity: Not on file  Stress: Not on file  Social Connections: Moderately Integrated (10/09/2023)   Social Connection and Isolation Panel [NHANES]    Frequency of Communication with Friends and Family: More than three times a week    Frequency of Social Gatherings with Friends and Family: More than three times a week    Attends Religious Services: 1 to 4 times per year    Active Member of [employer] or Organizations: No    Attends Tax inspector Meetings: 1 to 4 times per year    Marital Status: Widowed  Intimate Partner Violence: Not At Risk (10/09/2023)   Humiliation, Afraid, Rape, and Kick questionnaire    Fear of Current or Ex-Partner: No    Emotionally Abused: No    Physically Abused: No    Sexually Abused: No     FAMILY HISTORY Family History  Problem Relation Age of Onset   Hypertension Mother    Prostate cancer Father 88       deceased   Hypertension Sister    Asthma Sister    Hypertension Brother    Cancer Paternal Grandfather    Cancer Half-Sister        unknown, dx 83   Esophageal cancer Neg Hx    Colon polyps Neg Hx    Colon cancer Neg Hx    Stomach cancer Neg Hx    Rectal cancer Neg Hx    Inflammatory bowel disease Neg Hx    Liver disease Neg Hx    Pancreatic cancer Neg Hx       Review of Systems: 12 systems reviewed Otherwise as per HPI, all other systems reviewed and negative  Physical Exam: Vitals:   12/02/23 1415 12/02/23 1420  BP: (!) 87/66 (!) 82/64  Pulse: 73 72  Resp: 14 13  Temp:    SpO2: 100% 98%   Total I/O In: -  Out: 550 [Urine:550]  Intake/Output Summary (  Last 24 hours) at 12/02/2023 1442 Last data filed at 12/02/2023 1402 Gross per 24 hour  Intake --  Output 550 ml  Net -550 ml   General: well-appearing, no acute distress HEENT: anicteric sclera, oropharynx clear without lesions CV: Normal rate, no rub, 2+ pain edema in the bilateral lower extremities Lungs: clear to auscultation bilaterally, normal work of breathing Abd: soft, non-tender, non-distended Skin: no visible lesions or rashes Psych: alert, engaged, appropriate mood and affect Musculoskeletal: no obvious deformities Neuro: normal speech, no gross focal deficits   Test Results Reviewed Lab Results  Component Value Date   NA 119 (LL) 12/02/2023   K 3.8 12/02/2023   CL 80 (L) 12/02/2023   CO2 21 (L) 12/02/2023   BUN 94 (H) 12/02/2023   CREATININE 2.73 (H) 12/02/2023   GLU 72 10/31/2023    CALCIUM 7.5 (L) 12/02/2023   ALBUMIN 3.3 (L) 12/02/2023    CBC Recent Labs  Lab 12/02/23 0219  WBC 10.6*  HGB 15.4  HCT 42.5  MCV 104.2*  PLT 348    I have reviewed all relevant outside healthcare records related to the patient's current hospitalization   The patient is critically ill with AKI and severe hyponatremia and which includes my role to primarily manage aki and severe hyponatremia.  This requires high complexity decision making.  Total critical care time: 50 minutes    Critical care time was exclusive of treating other patients.   Critical care was necessary to treat or prevent imminent or life-threatening deterioration.   Critical care was time spent personally by me on the following activities:   development of treatment plan with patient and/or surrogate as well as nursing,   discussions with other provider evaluation of patient's response to treatment  examination of patient  obtaining history from patient or surrogate  ordering and performing treatments and interventions  ordering and review of laboratory studies  ordering and review of radiographic studies

## 2023-12-02 NOTE — Consult Note (Signed)
 NAME:  Kyle Montoya, MRN:  [id number], DOB:  06-04-1951, LOS: 0 ADMISSION DATE:  12/02/2023, CONSULTATION DATE: 12/02/2023 REFERRING MD: Dr. Robb Matar, CHIEF COMPLAINT: Hypotension  History of Present Illness:  Patient with a history of chronic diastolic congestive heart failure, paroxysmal atrial fibrillation, alcohol abuse, history of anemia, osteoarthritis, ascending aortic aneurysm, hypertension, history of cholangiocarcinoma, CML, history of lung cancer, COPD  Came in with general fatigue and weakness  York Spaniel he was recently treated for pneumonia Persistent lower extremity edema  He has been on diuretics  Denies any fevers, chills, not really coughing up any secretions Has no diarrhea, no abdominal pain or discomfort     Pertinent  Medical History   Past Medical History:  Diagnosis Date   Abnormal colonoscopy 02/23/2021   Abnormal transaminases 02/27/2022   Acute diastolic (congestive) heart failure (HCC) 10/08/2023   Adenomatous polyp of descending colon 02/23/2021   Adverse drug reaction, initial encounter 07/04/2016   07/02/2016: Prevnar 23 vaccine given in right arm  07/02/2016: localized swelling  10/11/20167: Increase in swelling from shoulder to inner aspect of elbow.Red, warm and firm to touch.  No lip or airway swelling or stridor.  2+ brachial and radial pulses right arm.  Capillary refill < 2 seconds     Anemia in chronic illness 01/22/2013   Arthritis, degenerative 01/31/2015   Ascending aortic aneurysm (HCC) 06/27/2022   Benign fibroma of prostate 01/31/2015   BP (high blood pressure) 01/22/2013   BPH (benign prostatic hyperplasia)    CAD (coronary artery disease)    Cannot sleep 01/31/2015   Carcinoma of biliary tract (HCC) 02/15/2013   Diagnosed in 2014, Pt had whipple done at [facility], under care by Dr. Deveron Furlong Oncologist, has f/u every 6-12 months.   Cataract    bilateral sx   Cervical osteoarthritis 10/18/2014   Cholangiocarcinoma of biliary tract  (HCC)    Chronic cough 01/31/2015   Chronic myelogenous leukemia (HCC)    Chronic pain 01/31/2015   CML (chronic myelocytic leukemia) (HCC) 10/18/2014   Overview:   Follows with [facility], Dr. Gilman Buttner. On dasatinib, last WBC 9.6.     Compulsive tobacco user syndrome 01/31/2015   Congestive heart failure (CHF) (HCC) 09/26/2023   COPD (chronic obstructive pulmonary disease) (HCC)    uses inhaler   COPD with asthma (HCC) 01/22/2013   Severe obstruction on spirometry FeV1 54% FeV1/FVC 51% 01/31/2015  CXR NAD 01/25/15     Diuretic-induced hypokalemia 05/23/2023   DJD (degenerative joint disease)    Dyslipidemia    Family history of colonic polyps    Family history of prostate cancer    Full thickness rotator cuff tear 08/08/2020   Gout    High coronary artery calcium score 05/30/2023   History of colon polyps 02/23/2021   History of colonic polyps 10/01/2021   IMO SNOMED Dx Update Oct 2024     HLD (hyperlipidemia) 01/31/2015   HTN (hypertension)    on meds   Hypercalcemia 08/10/2020   Hypercholesterolemia    Hypocalcemia 08/27/2021   Hyponatremia 09/19/2023   Impingement syndrome of both shoulders 03/31/2020   Lung cancer (HCC)    Myelogenous leukemia (HCC)    Non-small cell carcinoma of lung (HCC) 01/31/2015   02/13/2015 CT Chest no evidence of recurrence in lung cancer  Stage I adenocarcinoma diagnosed with left upper lobectomy in February 2016     OA (osteoarthritis)    Orthostatic hypotension 10/10/2023   PAD (peripheral artery disease) (HCC)    Personal  history of gastric ulcers 02/23/2021   Renal insufficiency    Shoulder pain, left 03/29/2020   Shoulder pain, right 03/29/2020   Tendonitis    patellar   Thoracic aortic aneurysm (HCC) 06/27/2022   Wheezing 01/31/2015   Significant Hospital Events: Including procedures, antibiotic start and stop dates in addition to other pertinent events   09/10/2023-echo with ejection fraction of 55 to 60%, right ventricle  poorly visualized  Interim History / Subjective:  Elderly gentleman, does not appear to be in distress Feels about the same as when he came in  Objective   Blood pressure 92/74, pulse 78, temperature 97.6 F (36.4 C), temperature source Oral, resp. rate 14, SpO2 99%.       No intake or output data in the 24 hours ending 12/02/23 1013 There were no vitals filed for this visit.  Examination: General: Elderly, does not appear to be in distress, chronically ill-appearing HENT: Dry oral mucosa Lungs: Clear breath sounds, decreased air movement at the bases Cardiovascular: S1-S2 appreciated Abdomen: Soft, bowel sounds appreciated Extremities: No clubbing, does have peripheral edema Neuro: Awake alert oriented x 3 GU:   Respiratory viral panel negative  BNP elevated BUN/creatinine elevated  Review chest x-ray with small pleural effusion on right  Resolved Hospital Problem list     Assessment & Plan:  Will benefit from close monitoring May be admitted to stepdown unit -Blood pressures when I saw the patient was 103/70, another measurement 92/74 -He is awake alert oriented x 3, no mental status changes -No history of seizures  Hyponatremia -Is hypervolemic -May be related to his heart failure, chronic kidney disease, diuretic use -Does need close monitoring  Acute kidney injury -Related to diuretics and probable decreased intake of fluids -Appreciate renal follow-up -Home medications was 80 mg of Lasix twice a day and 60 mg of Demadex, also on hydrochlorothiazide 12.5  Fluid overload -Still needs aggressive diuresis with close monitoring -Home medication includes Sprycel, -Could this be contributing to fluid retention as well?  Hypotension -Started on midodrine and appears to be stabilizing -May require pressors -Peripheral Levophed if needed, will support with a central venous access if needed  History of chronic obstructive pulmonary  disease/asthma -Bronchodilators -On Trelegy at home -Start on [place] nebulization treatments  Hyperlipidemia  Coronary artery disease -On daily aspirin  History of CML -Follows up with oncology as outpatient  History of hypertension -Hold antihypertensives  History of alcohol abuse -CIWA as needed  History of ascending aortic aneurysm -Follows up with vascular as outpatient  History of significant alcohol use -Monitor for withdrawal  Virl Diamond, MD  PCCM Pager: See Loretha Stapler

## 2023-12-02 NOTE — Progress Notes (Signed)
 Informed about low blood pressures  Will admit to ICU  Start on peripheral levophed

## 2023-12-02 NOTE — Progress Notes (Addendum)
  Carryover admission to the Day Admitter.  I discussed this case with the EDP, Dr. Alona Bene.  Per these discussions:   This is a 73 year old male with history of CHF, who was admitted in January 2025 with acute on chronic CHF exacerbation.  Following discharge from the hospital, he reports good compliance with his outpatient diuretic regimen, but notes progressive swelling in the bilateral lower extremities over the last several weeks associated with worsening dyspnea on exertion.  He presents this evening complaining of generalized weakness in the absence of any acute focal weakness.  Denies any recent chest discomfort.  Sodium found to be low at 118, along with an acute kidney injury.  BNP is doubled since this recent prior value in January.  Maintaining oxygen saturations in the mid 90s on room air, systolic blood pressures in the 90s, but is tolerated dose of 60 mg of IV Lasix as ordered by EDP.  Chest x-ray reportedly shows no overt evidence of acute cardiopulmonary process.  I have placed an order for inpatient admission to sdu for further evaluation management of presenting acute hypervolemic hypoosmolar hyponatremia in the setting of acute on chronic CHF, complicated by acute kidney injury as well as generalized weakness.   I have placed some additional preliminary admit orders via the adult multi-morbid admission order set. I have also ordered to 4-hour BMPs through noon today to closely monitor ensuing serum sodium trend in response to initial IV Lasix.  I will defer additional decision making regarding additional IV diuresis orders to the admitting hospitalist.  Also ordered urinalysis, random urine sodium, random urine creatinine, random urine osmolality, serum osmolality, as well as TSH.    Update: RN conveys that patient reports that he consumes about 1 pint of liquor per day, with most etoh occurring just prior to presenting to [facility] ED (last drink around 2200 on 12/01/2023.  I  subsequently ordered symptoms based CIWA protocol with as needed Ativan as well as thiamine, multivitamin and folic acid supplementation, with first dose of thiamine to occur now.  I also added on an ethanol level.  These details appear relevant in the context of his presenting acute hyponatremia    Newton Pigg, DO Hospitalist

## 2023-12-02 NOTE — ED Notes (Signed)
 Pt reports drinking pint of liquor daily to Clinical research associate. Last drink approximately 4hrs ago around midnight per patient. Howerter, MD notified.

## 2023-12-02 NOTE — ED Triage Notes (Signed)
 Pt comes via [facility] for SOB, recently had pneumonia and finished antibiotics. Tonight began to have sudden SOB, pt has hx of CHF and afib

## 2023-12-02 NOTE — ED Notes (Signed)
 Pt BP MAP at 60. Howerter, MD made aware. Pt mentation normal at this time. Per Howerter, MD, as long as MAP 60 or above and mentation is normal then nothing further is required. If MAP < 60 repeatedly then writer is to reach back out to MD for small fluid bolus.

## 2023-12-02 NOTE — Progress Notes (Signed)
  Echocardiogram 2D Echocardiogram has been performed.  Leda Roys RDCS 12/02/2023, 1:11 PM

## 2023-12-02 NOTE — H&P (Signed)
 History and Physical    Patient: Kyle Montoya ZOX:[id number] DOB: 1950/10/19 DOA: 12/02/2023 DOS: the patient was seen and examined on 12/02/2023 PCP: Lars Mage, NP  Patient coming from: Home  Chief Complaint:  Chief Complaint  Patient presents with   Shortness of Breath   HPI: Kyle Montoya is a 73 y.o. male with medical history significant of transaminitis, chronic diastolic CHF, paroxysmal atrial fibrillation, alcohol abuse, history of anemia, osteoarthritis, ascending aortic aneurysm, benign fibroma prostate, BPH, CAD, hypertension, cholangiocarcinoma, chronic myelogenous leukemia, lung cancer, compulsive tobacco use, COPD, hyperlipidemia, gout, cataracts, cervical DDD, colon polyps, hypocalcemia, hyponatremia peripheral arterial disease, gastric ulcers who was recently treated for pneumonia and finished antibiotics who presented to the emergency department with complaints of sudden onset of dyspnea, progressively worse generalized weakness and persistent lower extremity edema.  No orthopnea.  He has been having significant difficulty just walking inside his home. He has been taking his medications, which include furosemide, hydrochlorothiazide, irbesartan and torsemide.  No chest pain, palpitations, but has been feeling dizzy.  He denied fever, chills, rhinorrhea, sore throat or hemoptysis. No abdominal pain, nausea, emesis, diarrhea, constipation, melena or hematochezia.  No flank pain, dysuria, frequency or hematuria.  No polyuria, polydipsia, polyphagia or blurred vision.   Lab work: CBC showed white count 10.6, hemoglobin 15.4 g/dL with an MCV 811.9 fL and platelets 348.  Troponin was 60 and then 57 ng/L.  BNP 182.4 pg/mL.  Coronavirus, influenza and RSV PCR was negative.  BMP showed a 718, potassium 3.2, chloride 76 and CO2 20 mmol/L.  Glucose is normal, BUN was 99, creatinine 2.98 and calcium 7.7 mg/dL.  Osmolality was 291 kg/m.   Imaging: 2 view chest radiograph showing mild  residual right basilar opacity with improved hydration since 10/21/2023.  Unchanged small right pleural effusion.  Normal cardiomediastinal contours.  ED course: Initial vital signs were temperature 98.4 degrees, pulse 89, respiration 18, BP 87/62 mmHg O2 sat 98% on room air.  The patient received furosemide 60 mg IVP, Maalox 15 mL p.o. x 1 and midodrine 10 mg p.o. x 1.  I ordered NS 1000 mL bolus, but held per nephrology recommendation.  PCCM consulted for possible pressor support per nephrology recommendation.   Review of Systems: As mentioned in the history of present illness. All other systems reviewed and are negative. Past Medical History:  Diagnosis Date   Abnormal colonoscopy 02/23/2021   Abnormal transaminases 02/27/2022   Acute diastolic (congestive) heart failure (HCC) 10/08/2023   Adenomatous polyp of descending colon 02/23/2021   Adverse drug reaction, initial encounter 07/04/2016   07/02/2016: Prevnar 23 vaccine given in right arm  07/02/2016: localized swelling  10/11/20167: Increase in swelling from shoulder to inner aspect of elbow.Red, warm and firm to touch.  No lip or airway swelling or stridor.  2+ brachial and radial pulses right arm.  Capillary refill < 2 seconds     Anemia in chronic illness 01/22/2013   Arthritis, degenerative 01/31/2015   Ascending aortic aneurysm (HCC) 06/27/2022   Benign fibroma of prostate 01/31/2015   BP (high blood pressure) 01/22/2013   BPH (benign prostatic hyperplasia)    CAD (coronary artery disease)    Cannot sleep 01/31/2015   Carcinoma of biliary tract (HCC) 02/15/2013   Diagnosed in 2014, Pt had whipple done at [facility], under care by Dr. Deveron Furlong Oncologist, has f/u every 6-12 months.   Cataract    bilateral sx   Cervical osteoarthritis 10/18/2014   Cholangiocarcinoma of biliary tract (HCC)  Chronic cough 01/31/2015   Chronic myelogenous leukemia (HCC)    Chronic pain 01/31/2015   CML (chronic myelocytic leukemia) (HCC)  10/18/2014   Overview:   Follows with [facility], Dr. Gilman Buttner. On dasatinib, last WBC 9.6.     Compulsive tobacco user syndrome 01/31/2015   Congestive heart failure (CHF) (HCC) 09/26/2023   COPD (chronic obstructive pulmonary disease) (HCC)    uses inhaler   COPD with asthma (HCC) 01/22/2013   Severe obstruction on spirometry FeV1 54% FeV1/FVC 51% 01/31/2015  CXR NAD 01/25/15     Diuretic-induced hypokalemia 05/23/2023   DJD (degenerative joint disease)    Dyslipidemia    Family history of colonic polyps    Family history of prostate cancer    Full thickness rotator cuff tear 08/08/2020   Gout    High coronary artery calcium score 05/30/2023   History of colon polyps 02/23/2021   History of colonic polyps 10/01/2021   IMO SNOMED Dx Update Oct 2024     HLD (hyperlipidemia) 01/31/2015   HTN (hypertension)    on meds   Hypercalcemia 08/10/2020   Hypercholesterolemia    Hypocalcemia 08/27/2021   Hyponatremia 09/19/2023   Impingement syndrome of both shoulders 03/31/2020   Lung cancer (HCC)    Myelogenous leukemia (HCC)    Non-small cell carcinoma of lung (HCC) 01/31/2015   02/13/2015 CT Chest no evidence of recurrence in lung cancer  Stage I adenocarcinoma diagnosed with left upper lobectomy in February 2016     OA (osteoarthritis)    Orthostatic hypotension 10/10/2023   PAD (peripheral artery disease) (HCC)    Personal history of gastric ulcers 02/23/2021   Renal insufficiency    Shoulder pain, left 03/29/2020   Shoulder pain, right 03/29/2020   Tendonitis    patellar   Thoracic aortic aneurysm (HCC) 06/27/2022   Wheezing 01/31/2015   Past Surgical History:  Procedure Laterality Date   BIOPSY  04/30/2021   Procedure: BIOPSY;  Surgeon: Lemar Lofty., MD;  Location: [facility];  Service: Gastroenterology;;   Bowel duct surg  2014   Bowel cancer   CARDIAC CATHETERIZATION  2000   CHOLECYSTECTOMY     COLONOSCOPY  06/23/2015   mild sigmoid  diverticulosis. small external and internal hemorrhoids. otherwise normal colonoscopy to terminal ileum   COLONOSCOPY WITH PROPOFOL N/A 04/30/2021   Procedure: COLONOSCOPY WITH PROPOFOL;  Surgeon: Meridee Score Netty Starring., MD;  Location: [facility];  Service: Gastroenterology;  Laterality: N/A;   ENDOSCOPIC MUCOSAL RESECTION N/A 04/30/2021   Procedure: ENDOSCOPIC MUCOSAL RESECTION;  Surgeon: Meridee Score Netty Starring., MD;  Location: [facility];  Service: Gastroenterology;  Laterality: N/A;   ESOPHAGOGASTRODUODENOSCOPY (EGD) WITH PROPOFOL N/A 04/30/2021   Procedure: ESOPHAGOGASTRODUODENOSCOPY (EGD) WITH PROPOFOL;  Surgeon: Meridee Score Netty Starring., MD;  Location: [facility];  Service: Gastroenterology;  Laterality: N/A;   HEMOSTASIS CLIP PLACEMENT  04/30/2021   Procedure: HEMOSTASIS CLIP PLACEMENT;  Surgeon: Lemar Lofty., MD;  Location: [facility];  Service: Gastroenterology;;   LUNG CANCER SURGERY  09/2014   partial lobectomy   POLYPECTOMY  04/30/2021   Procedure: POLYPECTOMY;  Surgeon: Mansouraty, Netty Starring., MD;  Location: [facility];  Service: Gastroenterology;;   SHOULDER OPEN ROTATOR CUFF REPAIR Right 09/27/2020   Procedure: Right shoulder mini open rotator cuff repair, distal clavicle resection;  Surgeon: Jene Every, MD;  Location: [facility];  Service: Orthopedics;  Laterality: Right;  90 mins  Choice with Block anesthesia   SUBMUCOSAL LIFTING INJECTION  04/30/2021   Procedure: SUBMUCOSAL LIFTING INJECTION;  Surgeon:  Mansouraty, Netty Starring., MD;  Location: [facility];  Service: Gastroenterology;;   UPPER GASTROINTESTINAL ENDOSCOPY     WHIPPLE PROCEDURE  02/25/2013   Procedure: WHIPPLE PROCEDURE; Surgeon: Deveron Furlong, MD; Location: [facility] MAIN OR; Service: General; Laterality: N/A;   Social History:  reports that he has quit smoking. His smoking use included cigarettes. He has a 10 pack-year smoking history. He has never used smokeless tobacco. He reports current alcohol  use of about 21.0 standard drinks of alcohol per week. He reports that he does not use drugs.  Allergies  Allergen Reactions   Pneumococcal Vaccines Swelling    Family History  Problem Relation Age of Onset   Hypertension Mother    Prostate cancer Father 82       deceased   Hypertension Sister    Asthma Sister    Hypertension Brother    Cancer Paternal Grandfather    Cancer Half-Sister        unknown, dx 45   Esophageal cancer Neg Hx    Colon polyps Neg Hx    Colon cancer Neg Hx    Stomach cancer Neg Hx    Rectal cancer Neg Hx    Inflammatory bowel disease Neg Hx    Liver disease Neg Hx    Pancreatic cancer Neg Hx     Prior to Admission medications   Medication Sig Start Date End Date Taking? Authorizing Provider  albuterol (PROVENTIL HFA;VENTOLIN HFA) 108 (90 BASE) MCG/ACT inhaler Inhale 2 puffs into the lungs every 6 (six) hours as needed for wheezing or shortness of breath.   Yes [provider]  allopurinol (ZYLOPRIM) 300 MG tablet Take 300 mg by mouth daily.   Yes [provider]  aspirin EC 81 MG tablet Take 1 tablet (81 mg total) by mouth daily. Swallow whole. 05/30/23  Yes Georgeanna Lea, MD  Calcium Carbonate (CALCIUM 500 PO) Take 1 tablet by mouth 3 (three) times daily. 02/27/22  Yes Mosher, Cameron Proud, PA-C  Cholecalciferol (VITAMIN D) 50 MCG (2000 UT) tablet Take 2,000 Units by mouth daily.   Yes [provider]  diclofenac Sodium (VOLTAREN) 1 % GEL Apply 2 g topically at bedtime as needed (Pain). 07/10/20  Yes [provider]  fluticasone (FLONASE) 50 MCG/ACT nasal spray Place 2 sprays into both nostrils daily.   Yes [provider]  furosemide (LASIX) 80 MG tablet Take 80 mg by mouth 2 (two) times daily. 10/27/23  Yes [provider]  HYDROcodone-acetaminophen (NORCO/VICODIN) 5-325 MG tablet Take 1 tablet by mouth 4 (four) times daily as needed for moderate pain (pain score 4-6). 09/27/23  Yes [provider]   irbesartan-hydrochlorothiazide (AVALIDE) 150-12.5 MG tablet Take 1 tablet by mouth as needed (for bp over 140/80). 10/14/23  Yes [provider]  KLOR-CON M10 10 MEQ tablet TAKE 1 TABLET BY MOUTH EVERY DAY 11/06/23  Yes Dellia Beckwith, MD  methocarbamol (ROBAXIN) 750 MG tablet Take 750 mg by mouth every 6 (six) hours as needed for muscle spasms. 04/07/23  Yes [provider]  omeprazole (PRILOSEC) 40 MG capsule TAKE 1 CAPSULE (40 MG TOTAL) BY MOUTH 2 (TWO) TIMES DAILY BEFORE A MEAL. 11/21/23  Yes Lynann Bologna, MD  SPRYCEL 100 MG tablet TAKE 1 TABLET BY MOUTH DAILY 06/04/23  Yes Dellia Beckwith, MD  tamsulosin (FLOMAX) 0.4 MG CAPS capsule Take 0.8 mg by mouth daily. 09/08/23  Yes [provider]  torsemide (DEMADEX) 20 MG tablet Take 3 tablets (60 mg total) by  mouth daily. 11/05/23  Yes Georgeanna Lea, MD  TRELEGY ELLIPTA 100-62.5-25 MCG/INH AEPB Inhale 1 puff into the lungs daily at 6 (six) AM. 12/20/20  Yes [provider]    Physical Exam: Vitals:   12/02/23 0615 12/02/23 0630 12/02/23 0645 12/02/23 0700  BP: (!) 77/54 (!) 82/55 (!) 73/52 (!) 61/50  Pulse: 80 73 83 82  Resp: 10 11 12 12   Temp:      TempSrc:      SpO2: 98% 97% 97% 96%   Physical Exam Vitals and nursing note reviewed.  Constitutional:      General: He is awake. He is not in acute distress.    Appearance: He is well-developed. He is obese. He is ill-appearing.  HENT:     Head: Normocephalic.     Nose: No rhinorrhea.     Mouth/Throat:     Mouth: Mucous membranes are dry.  Eyes:     General: No scleral icterus.    Pupils: Pupils are equal, round, and reactive to light.  Neck:     Vascular: No JVD.  Cardiovascular:     Rate and Rhythm: Normal rate and regular rhythm.     Heart sounds: S1 normal and S2 normal.  Pulmonary:     Effort: Tachypnea present.     Breath sounds: No wheezing, rhonchi or rales.  Abdominal:     General: Bowel sounds are normal.      Palpations: Abdomen is soft.  Musculoskeletal:     Cervical back: Neck supple.     Right lower leg: Edema present.     Left lower leg: Edema present.     Comments: Moderate generalized weakness.  Skin:    General: Skin is warm and dry.  Neurological:     General: No focal deficit present.     Mental Status: He is alert and oriented to person, place, and time.  Psychiatric:        Mood and Affect: Mood normal.        Behavior: Behavior is cooperative.     Data Reviewed:  Results are pending, will review when available. Outside echo done on 09/10/2023 at  showed normal sinus rhythm with a left ventricle with normal cavity size, function, wall motion and wall thickness.  Estimated LVEF was 55 to 60%.  Left ventricle diastolic function is undetermined.  Wall Mongeon was globally normal.  Normal-sized left atrium.  Right ventricle was poorly visualized and appeared mildly dilated with normal function.  Right atrium not well-visualized.  Mitral valve not well-seen but appeared to have focal calcifications of the anterior mitral valve leaflet.  Aortic valve with no evidence of aortic stenosis, but there was mild aortic regurgitation.  Pulmonic valve not well-visualized.  Trace pulmonic valve regurgitation.  Tricuspid valve not well-visualized with mild regurgitation.  Unable to estimate right ventricle pressure.  Inferior vena cava normal in size.  No pericardial effusion.  Normal aortic root diameter.  Assessment and Plan: Principal Problem:   Metabolic acidosis  Due to:   AKI (acute kidney injury) (HCC) With severe:   Hyponatremia Stepdown/inpatient. Hold IVF. Hold ARB/ACE. Hold diuretics. Avoid hypotension. Avoid nephrotoxins. Check urine analysis. Check urine sodium, creatinine. Monitor intake and output. Monitor renal function/electrolytes. Nephrology has been consulted. -PCCM consulted per nephrology recommendation.  Active Problems:   Hypotension PCCM consulted.     Hypokalemia  Suspect diuretic use. Will recheck potassium level.    COPD with asthma (HCC) Bronchodilators as needed. Supplemental oxygen as needed.  Gout On allopurinol 300 mg p.o. daily.Marland Kitchen    HLD (hyperlipidemia) Currently not on medical therapy. Follow-up with PCP as an outpatient.    BPH (benign prostatic hyperplasia) Hold tamsulosin to avoid hypotension.    CAD (coronary artery disease) Continue daily aspirin. Follow-up with cardiology as an outpatient.    Chronic myelogenous leukemia (HCC) Follow-up with oncology as an outpatient.    HTN (hypertension) Hold antihypertensives.    Alcohol abuse Monitor for withdrawal symptoms. Begin CIWA protocol as needed.    Ascending aortic aneurysm [facility]) Follow-up with vascular surgery as an outpatient.    Advance Care Planning:   Code Status: Full Code   Consults: PCCM (Dr. Wynona Neat) and nephrology (Dr. Valentino Nose).  Family Communication:   Severity of Illness: The appropriate patient status for this patient is INPATIENT. Inpatient status is judged to be reasonable and necessary in order to provide the required intensity of service to ensure the patient's safety. The patient's presenting symptoms, physical exam findings, and initial radiographic and laboratory data in the context of their chronic comorbidities is felt to place them at high risk for further clinical deterioration. Furthermore, it is not anticipated that the patient will be medically stable for discharge from the hospital within 2 midnights of admission.   * I certify that at the point of admission it is my clinical judgment that the patient will require inpatient hospital care spanning beyond 2 midnights from the point of admission due to high intensity of service, high risk for further deterioration and high frequency of surveillance required.*  Author: Bobette Mo, MD 12/02/2023 8:49 AM  For on call review [email or web address].   This document was prepared  using Dragon voice recognition software and may contain some unintended transcription errors.

## 2023-12-03 DIAGNOSIS — N179 Acute kidney failure, unspecified: Secondary | ICD-10-CM | POA: Diagnosis not present

## 2023-12-03 DIAGNOSIS — E871 Hypo-osmolality and hyponatremia: Secondary | ICD-10-CM

## 2023-12-03 DIAGNOSIS — I509 Heart failure, unspecified: Secondary | ICD-10-CM | POA: Diagnosis not present

## 2023-12-03 DIAGNOSIS — I959 Hypotension, unspecified: Secondary | ICD-10-CM | POA: Diagnosis not present

## 2023-12-03 LAB — BASIC METABOLIC PANEL
Anion gap: 19 — ABNORMAL HIGH (ref 5–15)
Anion gap: 18 — ABNORMAL HIGH (ref 5–15)
Anion gap: 19 — ABNORMAL HIGH (ref 5–15)
Anion gap: 13 (ref 5–15)
Anion gap: 13 (ref 5–15)
BUN: 97 mg/dL — ABNORMAL HIGH (ref 8–23)
BUN: 90 mg/dL — ABNORMAL HIGH (ref 8–23)
BUN: 86 mg/dL — ABNORMAL HIGH (ref 8–23)
BUN: 78 mg/dL — ABNORMAL HIGH (ref 8–23)
BUN: 78 mg/dL — ABNORMAL HIGH (ref 8–23)
CO2: 22 mmol/L (ref 22–32)
CO2: 22 mmol/L (ref 22–32)
CO2: 23 mmol/L (ref 22–32)
CO2: 30 mmol/L (ref 22–32)
CO2: 28 mmol/L (ref 22–32)
Calcium: 7.9 mg/dL — ABNORMAL LOW (ref 8.9–10.3)
Calcium: 7.9 mg/dL — ABNORMAL LOW (ref 8.9–10.3)
Calcium: 8.2 mg/dL — ABNORMAL LOW (ref 8.9–10.3)
Calcium: 8.6 mg/dL — ABNORMAL LOW (ref 8.9–10.3)
Calcium: 8.5 mg/dL — ABNORMAL LOW (ref 8.9–10.3)
Chloride: 81 mmol/L — ABNORMAL LOW (ref 98–111)
Chloride: 82 mmol/L — ABNORMAL LOW (ref 98–111)
Chloride: 82 mmol/L — ABNORMAL LOW (ref 98–111)
Chloride: 80 mmol/L — ABNORMAL LOW (ref 98–111)
Chloride: 81 mmol/L — ABNORMAL LOW (ref 98–111)
Creatinine, Ser: 2.22 mg/dL — ABNORMAL HIGH (ref 0.61–1.24)
Creatinine, Ser: 1.87 mg/dL — ABNORMAL HIGH (ref 0.61–1.24)
Creatinine, Ser: 1.58 mg/dL — ABNORMAL HIGH (ref 0.61–1.24)
Creatinine, Ser: 1.49 mg/dL — ABNORMAL HIGH (ref 0.61–1.24)
Creatinine, Ser: 1.37 mg/dL — ABNORMAL HIGH (ref 0.61–1.24)
GFR, Estimated: 30 mL/min — ABNORMAL LOW (ref >60)
GFR, Estimated: 37 mL/min — ABNORMAL LOW (ref >60)
GFR, Estimated: 46 mL/min — ABNORMAL LOW (ref >60)
GFR, Estimated: 49 mL/min — ABNORMAL LOW (ref >60)
GFR, Estimated: 54 mL/min — ABNORMAL LOW (ref >60)
Glucose, Bld: 164 mg/dL — ABNORMAL HIGH (ref 70–99)
Glucose, Bld: 170 mg/dL — ABNORMAL HIGH (ref 70–99)
Glucose, Bld: 184 mg/dL — ABNORMAL HIGH (ref 70–99)
Glucose, Bld: 203 mg/dL — ABNORMAL HIGH (ref 70–99)
Glucose, Bld: 179 mg/dL — ABNORMAL HIGH (ref 70–99)
Potassium: 3.5 mmol/L (ref 3.5–5.1)
Potassium: 3.5 mmol/L (ref 3.5–5.1)
Potassium: 3.1 mmol/L — ABNORMAL LOW (ref 3.5–5.1)
Potassium: 3.5 mmol/L (ref 3.5–5.1)
Potassium: 3.5 mmol/L (ref 3.5–5.1)
Sodium: 122 mmol/L — ABNORMAL LOW (ref 135–145)
Sodium: 122 mmol/L — ABNORMAL LOW (ref 135–145)
Sodium: 124 mmol/L — ABNORMAL LOW (ref 135–145)
Sodium: 123 mmol/L — ABNORMAL LOW (ref 135–145)
Sodium: 122 mmol/L — ABNORMAL LOW (ref 135–145)

## 2023-12-03 LAB — MRSA NEXT GEN BY PCR, NASAL: MRSA by PCR Next Gen: NOT DETECTED

## 2023-12-03 MED ORDER — PHENYLEPHRINE HCL-NACL 20-0.9 MG/250ML-% IV SOLN
25.0000 ug/min | INTRAVENOUS | Status: DC
  Administered 2023-12-03: 25 ug/min via INTRAVENOUS
  Filled 2023-12-03: qty 250

## 2023-12-03 MED ORDER — POTASSIUM CHLORIDE CRYS ER 20 MEQ PO TBCR
40.0000 meq | EXTENDED_RELEASE_TABLET | ORAL | Status: AC
  Administered 2023-12-03 (×3): 40 meq via ORAL
  Filled 2023-12-03 (×3): qty 2

## 2023-12-03 MED ORDER — POTASSIUM CHLORIDE 20 MEQ PO PACK: 40.0000 meq | PACK | Freq: Once | ORAL | Status: DC

## 2023-12-03 MED ORDER — FUROSEMIDE 10 MG/ML IJ SOLN
120.0000 mg | Freq: Four times a day (QID) | INTRAVENOUS | Status: AC
Start: 2023-12-03 — End: 2023-12-03
  Administered 2023-12-03 (×2): 120 mg via INTRAVENOUS
  Filled 2023-12-03 (×2): qty 12

## 2023-12-03 MED ORDER — SODIUM CHLORIDE 0.9 % IV SOLN: 250.0000 mL | INTRAVENOUS | Status: AC

## 2023-12-03 NOTE — Plan of Care (Signed)
  Problem: Clinical Measurements: Goal: Cardiovascular complication will be avoided Outcome: Progressing   Problem: Activity: Goal: Risk for activity intolerance will decrease Outcome: Progressing   Problem: Nutrition: Goal: Adequate nutrition will be maintained Outcome: Progressing   Problem: Skin Integrity: Goal: Risk for impaired skin integrity will decrease Outcome: Progressing

## 2023-12-03 NOTE — Progress Notes (Signed)
 Nephrology Follow-Up Consult note   Assessment/Recommendations: Kyle Montoya is a/an 73 y.o. male with a past medical history significant for Alcohol use disorder, CHF, atrial fibrillation, BPH, CAD, HTN CML, lung cancer, COPD, H LD, who presents with Heart failure exacerbation, acute severe hyponatremia, AKI, shock   AKI: Crt normal at baseline. Llikely ATN and possibly cardiorenal -improving quickly with blood pressure support -Consider CRRT if patient fails to improve -Continue to monitor daily Cr, Dose meds for GFR -Monitor Daily I/Os, Daily weight  -Maintain MAP>65 for optimal renal perfusion.  -Avoid nephrotoxic medications including NSAIDs -Use synthetic opioids (Fentanyl/Dilaudid) if needed -RUS without obstruction   Acute heart failure exacerbation:With hyponatremia as below.  Volume excess contributing to hyponatremia.  Good urine output and creatinine improving.  Continue with IV Lasix today   Acute asymptomatic hyponatremia: Contributions from thiazide, volume excess, alcohol intake.  Improving steadily with diuresis.  Continue IV Lasix today.  Monitor sodium closely.   Hypokalemia: Continue to aggressively correct as hypokalemia can make osmotic demyelination more of a concern particularly in alcoholics.   Shock: Possibly related to home blood pressure medications.  Continue pressors per primary team   Alcohol use disorder: Management per primary team   Hypertension: Hold home blood pressure medications.  Will not be able to take hydrochlorothiazide again   Recommendations conveyed to primary service.    Darnell Level [facility] 12/03/2023 12:42 PM  ___________________________________________________________  CC: shortness of breath  Interval History/Subjective: patient tired but denies any complaints.  Good urine output.  Creatinine improving.  Sodium improvement.  On norepinephrine   Medications:  Current Facility-Administered Medications   Medication Dose Route Frequency Provider Last Rate Last Admin   0.9 %  sodium chloride infusion  250 mL Intravenous Continuous Ogan, Thomasene Lot, MD       acetaminophen (TYLENOL) tablet 650 mg  650 mg Oral Q6H PRN Howerter, Justin B, DO   650 mg at 12/03/23 0516   Or   acetaminophen (TYLENOL) suppository 650 mg  650 mg Rectal Q6H PRN Howerter, Justin B, DO       albuterol (PROVENTIL) (2.5 MG/3ML) 0.083% nebulizer solution 2.5 mg  2.5 mg Nebulization Q4H PRN Bobette Mo, MD       arformoterol [facility]) nebulizer solution 15 mcg  15 mcg Nebulization BID Olalere, Adewale A, MD   15 mcg at 12/03/23 0759   Chlorhexidine Gluconate Cloth 2 % PADS 6 each  6 each Topical Daily Olalere, Adewale A, MD   6 each at 12/02/23 1932   docusate sodium (COLACE) capsule 100 mg  100 mg Oral BID PRN Virl Diamond A, MD       folic acid (FOLVITE) tablet 1 mg  1 mg Oral Daily Howerter, Justin B, DO   1 mg at 12/03/23 0921   furosemide (LASIX) 120 mg in dextrose 5 % 50 mL IVPB  120 mg Intravenous Q6H Darnell Level, MD   Stopped at 12/03/23 0959   heparin injection 5,000 Units  5,000 Units Subcutaneous Q8H Olalere, Adewale A, MD   5,000 Units at 12/03/23 0552   LORazepam (ATIVAN) tablet 1-4 mg  1-4 mg Oral Q1H PRN Howerter, Justin B, DO       Or   LORazepam (ATIVAN) injection 1-4 mg  1-4 mg Intravenous Q1H PRN Howerter, Justin B, DO       midodrine (PROAMATINE) tablet 10 mg  10 mg Oral TID WC Olalere, Adewale A, MD   10 mg at 12/03/23 1222  multivitamin with minerals tablet 1 tablet  1 tablet Oral Daily Howerter, Justin B, DO   1 tablet at 12/03/23 0921   norepinephrine (LEVOPHED) 4mg  in (0.016 mg/mL) premix infusion  2-10 mcg/min Intravenous Titrated Olalere, Adewale A, MD 33.8 mL/hr at 12/03/23 1127 9 mcg/min at 12/03/23 1127   ondansetron (ZOFRAN) injection 4 mg  4 mg Intravenous Q6H PRN Howerter, Justin B, DO   4 mg at 12/02/23 0402   Oral care mouth rinse  15 mL Mouth Rinse PRN Olalere, Adewale  A, MD       oxyCODONE (Oxy IR/ROXICODONE) immediate release tablet 5 mg  5 mg Oral Q6H PRN Migdalia Dk, MD   5 mg at 12/02/23 2032   polyethylene glycol (MIRALAX / GLYCOLAX) packet 17 g  17 g Oral Daily PRN Olalere, Adewale A, MD       potassium chloride SA (KLOR-CON M) CR tablet 40 mEq  40 mEq Oral Q4H Darnell Level, MD   40 mEq at 12/03/23 1222   revefenacin (YUPELRI) nebulizer solution 175 mcg  175 mcg Nebulization Daily Olalere, Adewale A, MD   175 mcg at 12/03/23 0759   thiamine (VITAMIN B1) tablet 100 mg  100 mg Oral Daily Howerter, Justin B, DO   100 mg at 12/03/23 1610   Or   thiamine (VITAMIN B1) injection 100 mg  100 mg Intravenous Daily Howerter, Justin B, DO          Review of Systems: 10 systems reviewed and negative except per interval history/subjective  Physical Exam: Vitals:   12/03/23 1145 12/03/23 1200  BP: 107/67 94/61  Pulse: 66 (!) 54  Resp: 17 13  Temp:    SpO2: 97% 96%   Total I/O In: 264 [I.V.:202.2; IV Piggyback:61.9] Out: 1100 [Urine:1100]  Intake/Output Summary (Last 24 hours) at 12/03/2023 1242 Last data filed at 12/03/2023 1142 Gross per 24 hour  Intake 915.22 ml  Output 4500 ml  Net -3584.78 ml   Constitutional: tired-appearing, no acute distress ENMT: ears and nose without scars or lesions, MMM CV: normal rate, 1+ edema in ble Respiratory: bilateral chest rise, normal work of breathing Gastrointestinal: soft, non-tender, no palpable masses or hernias Skin: no visible lesions or rashes Psych: tired but will awake to voice, appropriate mood and affect   Test Results I personally reviewed new and old clinical labs and radiology tests Lab Results  Component Value Date   NA 124 (L) 12/03/2023   K 3.1 (L) 12/03/2023   CL 82 (L) 12/03/2023   CO2 23 12/03/2023   BUN 86 (H) 12/03/2023   CREATININE 1.58 (H) 12/03/2023   GLU 72 10/31/2023   CALCIUM 8.2 (L) 12/03/2023   ALBUMIN 3.3 (L) 12/02/2023    CBC Recent Labs  Lab  12/02/23 0219 12/02/23 1552  WBC 10.6* 10.3  HGB 15.4 13.2  HCT 42.5 36.9*  MCV 104.2* 105.7*  PLT 348 316

## 2023-12-03 NOTE — Progress Notes (Signed)
 NAME:  Kyle Montoya, MRN:  [id number], DOB:  [birth year], LOS: 1 ADMISSION DATE:  12/02/2023, CONSULTATION DATE: 12/02/2023 REFERRING MD: Dr. Robb Matar, CHIEF COMPLAINT: Hypotension  History of Present Illness:  Patient with a history of chronic diastolic congestive heart failure, paroxysmal atrial fibrillation, alcohol abuse, history of anemia, osteoarthritis, ascending aortic aneurysm, hypertension, history of cholangiocarcinoma, CML, history of lung cancer, COPD   Came in with general fatigue and weakness   York Spaniel he was recently treated for pneumonia Persistent lower extremity edema   He has been on diuretics   Denies any fevers, chills, not really coughing up any secretions Has no diarrhea, no abdominal pain or discomfort  Pertinent  Medical History   Past Medical History:  Diagnosis Date   Abnormal colonoscopy 02/23/2021   Abnormal transaminases 02/27/2022   Acute diastolic (congestive) heart failure (HCC) 10/08/2023   Adenomatous polyp of descending colon 02/23/2021   Adverse drug reaction, initial encounter 07/04/2016   07/02/2016: Prevnar 23 vaccine given in right arm  07/02/2016: localized swelling  10/11/20167: Increase in swelling from shoulder to inner aspect of elbow.Red, warm and firm to touch.  No lip or airway swelling or stridor.  2+ brachial and radial pulses right arm.  Capillary refill < 2 seconds     Anemia in chronic illness 01/22/2013   Arthritis, degenerative 01/31/2015   Ascending aortic aneurysm (HCC) 06/27/2022   Benign fibroma of prostate 01/31/2015   BP (high blood pressure) 01/22/2013   BPH (benign prostatic hyperplasia)    CAD (coronary artery disease)    Cannot sleep 01/31/2015   Carcinoma of biliary tract (HCC) 02/15/2013   Diagnosed in 2014, Pt had whipple done at [facility], under care by Dr. Deveron Furlong Oncologist, has f/u every 6-12 months.   Cataract    bilateral sx   Cervical osteoarthritis 10/18/2014   Cholangiocarcinoma of biliary tract  (HCC)    Chronic cough 01/31/2015   Chronic myelogenous leukemia (HCC)    Chronic pain 01/31/2015   CML (chronic myelocytic leukemia) (HCC) 10/18/2014   Overview:   Follows with [facility], Dr. Gilman Buttner. On dasatinib, last WBC 9.6.     Compulsive tobacco user syndrome 01/31/2015   Congestive heart failure (CHF) (HCC) 09/26/2023   COPD (chronic obstructive pulmonary disease) (HCC)    uses inhaler   COPD with asthma (HCC) 01/22/2013   Severe obstruction on spirometry FeV1 54% FeV1/FVC 51% 01/31/2015  CXR NAD 01/25/15     Diuretic-induced hypokalemia 05/23/2023   DJD (degenerative joint disease)    Dyslipidemia    Family history of colonic polyps    Family history of prostate cancer    Full thickness rotator cuff tear 08/08/2020   Gout    High coronary artery calcium score 05/30/2023   History of colon polyps 02/23/2021   History of colonic polyps 10/01/2021   IMO SNOMED Dx Update Oct 2024     HLD (hyperlipidemia) 01/31/2015   HTN (hypertension)    on meds   Hypercalcemia 08/10/2020   Hypercholesterolemia    Hypocalcemia 08/27/2021   Hyponatremia 09/19/2023   Impingement syndrome of both shoulders 03/31/2020   Lung cancer (HCC)    Myelogenous leukemia (HCC)    Non-small cell carcinoma of lung (HCC) 01/31/2015   02/13/2015 CT Chest no evidence of recurrence in lung cancer  Stage I adenocarcinoma diagnosed with left upper lobectomy in February 2016     OA (osteoarthritis)    Orthostatic hypotension 10/10/2023   PAD (peripheral artery disease) (HCC)  Personal history of gastric ulcers 02/23/2021   Renal insufficiency    Shoulder pain, left 03/29/2020   Shoulder pain, right 03/29/2020   Tendonitis    patellar   Thoracic aortic aneurysm (HCC) 06/27/2022   Wheezing 01/31/2015   Significant Hospital Events: Including procedures, antibiotic start and stop dates in addition to other pertinent events   09/10/2023-echo with ejection fraction of 55 to 60% poorly visualized  right ventricle 3/11 admitted, requiring pressors  Interim History / Subjective:  Elderly, does not appear to be in distress Denies shortness of breath, denies pain or discomfort Uneventful night  Objective   Blood pressure 112/64, pulse 69, temperature 98.2 F (36.8 C), temperature source Axillary, resp. rate 10, height 5\' 11"  (1.803 m), weight 92.4 kg, SpO2 95%.        Intake/Output Summary (Last 24 hours) at 12/03/2023 0859 Last data filed at 12/03/2023 0800 Gross per 24 hour  Intake 723.14 ml  Output 3400 ml  Net -2676.86 ml   Filed Weights   12/02/23 1800 12/03/23 0219  Weight: 93.4 kg 92.4 kg   Examination: General: Elderly, does not appear to be in distress, chronically ill-appearing HENT: Dry oral mucosa Lungs: Clear breath sounds, decreased air movement at the bases Cardiovascular: S1-S2 appreciated Abdomen: Soft, bowel sounds appreciated Extremities: No clubbing, no edema Neuro: Awake, alert and oriented x 3 GU:   Respiratory viral panel negative, BNP elevated, BUN/creatinine elevated Chronic right pleural effusion  Resolved Hospital Problem list     Assessment & Plan:  Decompensated heart failure -Optimizing fluid status  Hypotension -On midodrine -Wean off peripheral pressors  Acute kidney injury -Likely related to diuretics and probable decreased intake -Renal service continues to follow -On diuretics, Lasix boluses  Still fluid overloaded -Cautious diuresis -Concern with Sprycel as this may contribute to fluid retention  Hyponatremia -This is resolving  History of chronic obstructive pulmonary disease/asthma -Continue bronchodilators -On Yupelri, Brovana nebulization treatments -Was on Trelegy at home  Coronary artery disease -Continue aspirin  History of CML -Oncology as outpatient  History of alcohol abuse -CIWA as needed  Ascending aortic aneurysm history -Vascular as outpatient  Best Practice (right click and "Reselect all  SmartList Selections" daily)   Diet/type: Regular consistency (see orders) DVT prophylaxis prophylactic heparin  Pressure ulcer(s): N/A GI prophylaxis: N/A Lines: N/A Foley:  Yes, and it is still needed Code Status:  full code Last date of multidisciplinary goals of care discussion [Per primary]  Labs   CBC: Recent Labs  Lab 12/02/23 0219 12/02/23 1552  WBC 10.6* 10.3  HGB 15.4 13.2  HCT 42.5 36.9*  MCV 104.2* 105.7*  PLT 348 316    Basic Metabolic Panel: Recent Labs  Lab 12/02/23 1159 12/02/23 1552 12/02/23 2035 12/03/23 0012 12/03/23 0311  NA 119* 119* 122* 122* 122*  K 3.8 3.6 4.0 3.5 3.5  CL 80* 80* 81* 81* 82*  CO2 21* 21* 24 22 22   GLUCOSE 119* 156* 168* 164* 170*  BUN 94* 92* 90* 97* 90*  CREATININE 2.73* 2.46* 2.32* 2.22* 1.87*  CALCIUM 7.5* 7.2* 7.8* 7.9* 7.9*  MG 2.8*  --   --   --   --    GFR: Estimated Creatinine Clearance: 40.2 mL/min (A) (by C-G formula based on SCr of 1.87 mg/dL (H)). Recent Labs  Lab 12/02/23 0219 12/02/23 1552  WBC 10.6* 10.3    Liver Function Tests: Recent Labs  Lab 12/02/23 1159  AST 70*  ALT 36  ALKPHOS 71  BILITOT 1.0  PROT 6.9  ALBUMIN 3.3*   No results for input(s): "LIPASE", "AMYLASE" in the last 168 hours. No results for input(s): "AMMONIA" in the last 168 hours.  ABG No results found for: "PHART", "PCO2ART", "PO2ART", "HCO3", "TCO2", "ACIDBASEDEF", "O2SAT"   Coagulation Profile: No results for input(s): "INR", "PROTIME" in the last 168 hours.  Cardiac Enzymes: Recent Labs  Lab 12/02/23 1159  CKTOTAL 143    HbA1C: Hemoglobin A1C  Date/Time Value Ref Range Status  12/13/2022 12:00 AM 4.5  Final   Hgb A1c MFr Bld  Date/Time Value Ref Range Status  09/25/2020 09:17 AM 4.7 (L) 4.8 - 5.6 % Final    Comment:    (NOTE) Pre diabetes:          5.7%-6.4%  Diabetes:              >6.4%  Glycemic control for   <7.0% adults with diabetes     CBG: No results for input(s): "GLUCAP" in the last  168 hours.  Review of Systems:   Arousable, denies any significant complaints  Past Medical History:  He,  has a past medical history of Abnormal colonoscopy (02/23/2021), Abnormal transaminases (02/27/2022), Acute diastolic (congestive) heart failure (HCC) (10/08/2023), Adenomatous polyp of descending colon (02/23/2021), Adverse drug reaction, initial encounter (07/04/2016), Anemia in chronic illness (01/22/2013), Arthritis, degenerative (01/31/2015), Ascending aortic aneurysm (HCC) (06/27/2022), Benign fibroma of prostate (01/31/2015), BP (high blood pressure) (01/22/2013), BPH (benign prostatic hyperplasia), CAD (coronary artery disease), Cannot sleep (01/31/2015), Carcinoma of biliary tract (HCC) (02/15/2013), Cataract, Cervical osteoarthritis (10/18/2014), Cholangiocarcinoma of biliary tract (HCC), Chronic cough (01/31/2015), Chronic myelogenous leukemia (HCC), Chronic pain (01/31/2015), CML (chronic myelocytic leukemia) (HCC) (10/18/2014), Compulsive tobacco user syndrome (01/31/2015), Congestive heart failure (CHF) (HCC) (09/26/2023), COPD (chronic obstructive pulmonary disease) (HCC), COPD with asthma (HCC) (01/22/2013), Diuretic-induced hypokalemia (05/23/2023), DJD (degenerative joint disease), Dyslipidemia, Family history of colonic polyps, Family history of prostate cancer, Full thickness rotator cuff tear (08/08/2020), Gout, High coronary artery calcium score (05/30/2023), History of colon polyps (02/23/2021), History of colonic polyps (10/01/2021), HLD (hyperlipidemia) (01/31/2015), HTN (hypertension), Hypercalcemia (08/10/2020), Hypercholesterolemia, Hypocalcemia (08/27/2021), Hyponatremia (09/19/2023), Impingement syndrome of both shoulders (03/31/2020), Lung cancer (HCC), Myelogenous leukemia (HCC), Non-small cell carcinoma of lung (HCC) (01/31/2015), OA (osteoarthritis), Orthostatic hypotension (10/10/2023), PAD (peripheral artery disease) (HCC), Personal history of gastric ulcers  (02/23/2021), Renal insufficiency, Shoulder pain, left (03/29/2020), Shoulder pain, right (03/29/2020), Tendonitis, Thoracic aortic aneurysm (HCC) (06/27/2022), and Wheezing (01/31/2015).   Surgical History:   Past Surgical History:  Procedure Laterality Date   BIOPSY  04/30/2021   Procedure: BIOPSY;  Surgeon: Meridee Score Netty Starring., MD;  Location: [facility];  Service: Gastroenterology;;   Bowel duct surg  2014   Bowel cancer   CARDIAC CATHETERIZATION  2000   CHOLECYSTECTOMY     COLONOSCOPY  06/23/2015   mild sigmoid diverticulosis. small external and internal hemorrhoids. otherwise normal colonoscopy to terminal ileum   COLONOSCOPY WITH PROPOFOL N/A 04/30/2021   Procedure: COLONOSCOPY WITH PROPOFOL;  Surgeon: Meridee Score Netty Starring., MD;  Location: [facility];  Service: Gastroenterology;  Laterality: N/A;   ENDOSCOPIC MUCOSAL RESECTION N/A 04/30/2021   Procedure: ENDOSCOPIC MUCOSAL RESECTION;  Surgeon: Meridee Score Netty Starring., MD;  Location: [facility];  Service: Gastroenterology;  Laterality: N/A;   ESOPHAGOGASTRODUODENOSCOPY (EGD) WITH PROPOFOL N/A 04/30/2021   Procedure: ESOPHAGOGASTRODUODENOSCOPY (EGD) WITH PROPOFOL;  Surgeon: Meridee Score Netty Starring., MD;  Location: [facility];  Service: Gastroenterology;  Laterality: N/A;   HEMOSTASIS CLIP PLACEMENT  04/30/2021   Procedure: HEMOSTASIS CLIP PLACEMENT;  Surgeon: Corliss Parish  Montez Hageman., MD;  Location: [facility];  Service: Gastroenterology;;   LUNG CANCER SURGERY  09/2014   partial lobectomy   POLYPECTOMY  04/30/2021   Procedure: POLYPECTOMY;  Surgeon: Mansouraty, Netty Starring., MD;  Location: [facility];  Service: Gastroenterology;;   SHOULDER OPEN ROTATOR CUFF REPAIR Right 09/27/2020   Procedure: Right shoulder mini open rotator cuff repair, distal clavicle resection;  Surgeon: Jene Every, MD;  Location: [facility];  Service: Orthopedics;  Laterality: Right;  90 mins  Choice with Block anesthesia   SUBMUCOSAL LIFTING  INJECTION  04/30/2021   Procedure: SUBMUCOSAL LIFTING INJECTION;  Surgeon: Lemar Lofty., MD;  Location: [facility];  Service: Gastroenterology;;   UPPER GASTROINTESTINAL ENDOSCOPY     WHIPPLE PROCEDURE  02/25/2013   Procedure: WHIPPLE PROCEDURE; Surgeon: Deveron Furlong, MD; [facility] MAIN [facility]; Service: General; Laterality: N/A;     Social History:   reports that he has quit smoking. His smoking use included cigarettes. He has a 10 pack-year smoking history. He has never used smokeless tobacco. He reports current alcohol use of about 21.0 standard drinks of alcohol per week. He reports that he does not use drugs.   Family History:  His family history includes Asthma in his sister; Cancer in his half-sister and paternal grandfather; Hypertension in his brother, mother, and sister; Prostate cancer (age of onset: 51) in his father. There is no history of Esophageal cancer, Colon polyps, Colon cancer, Stomach cancer, Rectal cancer, Inflammatory bowel disease, Liver disease, or Pancreatic cancer.   Allergies Allergies  Allergen Reactions   Pneumococcal Vaccines Swelling    The patient is critically ill with multiple organ systems failure and requires high complexity decision making for assessment and support, frequent evaluation and titration of therapies, application of advanced monitoring technologies and extensive interpretation of multiple databases. Critical Care Time devoted to patient care services described in this note independent of APP/resident time (if applicable)  is 32 minutes.   Virl Diamond MD [facility] Personal pager: See Amion If unanswered, please page CCM On-call: #[phone]

## 2023-12-03 NOTE — TOC Initial Note (Signed)
 Transition of Care [facility]) - Initial/Assessment Note   Patient Details  Name: Kyle Montoya MRN: [id number] Date of Birth: [birth year]  [facility] CM/SW Contact:    Ewing Schlein, LCSW Phone Number: 12/03/2023, 10:44 AM  Clinical Narrative: [facility] consulted for ETOH use resources. CSW met with patient to discuss consult and patient is agreeable to resources. Resource list added to AVS.                  Expected Discharge Plan: Home/Self Care Barriers to Discharge: Continued Medical Work up  Patient Goals and CMS Choice Patient states their goals for this hospitalization and ongoing recovery are:: Get ETOH use cessation resources Choice offered to / list presented to : NA  Expected Discharge Plan and Services In-house Referral: Clinical Social Work Post Acute Care Choice: NA Living arrangements for the past 2 months: Single Family Home             DME Arranged: N/A DME Agency: NA  Prior Living Arrangements/Services Living arrangements for the past 2 months: Single Family Home Patient language and need for interpreter reviewed:: Yes Do you feel safe going back to the place where you live?: Yes      Need for Family Participation in Patient Care: No (Comment) Care giver support system in place?: Yes (comment) Current home services: DME (Walker, shower chair) Criminal Activity/Legal Involvement Pertinent to Current Situation/Hospitalization: No - Comment as needed  Activities of Daily Living ADL Screening (condition at time of admission) Independently performs ADLs?: Yes (appropriate for developmental age) Is the patient deaf or have difficulty hearing?: No Does the patient have difficulty seeing, even when wearing glasses/contacts?: No Does the patient have difficulty concentrating, remembering, or making decisions?: No  Emotional Assessment Appearance:: Appears stated age Attitude/Demeanor/Rapport: Engaged Affect (typically observed): Accepting Orientation: :  Oriented to Self, Oriented to Place, Oriented to  Time, Oriented to Situation Alcohol / Substance Use: Alcohol Use Psych Involvement: No (comment)  Admission diagnosis:  Hyponatremia [E87.1] Shock (HCC) [R57.9] SOB (shortness of breath) [R06.02] Hypervolemia, unspecified hypervolemia type [E87.70] Patient Active Problem List   Diagnosis Date Noted   AKI (acute kidney injury) (HCC) 12/02/2023   Hypotension 12/02/2023   Alcohol abuse 12/02/2023   Hypokalemia 12/02/2023   Metabolic acidosis 12/02/2023   Shock (HCC) 12/02/2023   Orthostatic hypotension 10/10/2023   Acute diastolic (congestive) heart failure (HCC) 10/08/2023   Congestive heart failure (CHF) (HCC) 09/26/2023   BPH (benign prostatic hyperplasia)    CAD (coronary artery disease)    Chronic myelogenous leukemia (HCC)    COPD (chronic obstructive pulmonary disease) (HCC)    DJD (degenerative joint disease)    Dyslipidemia    Family history of colonic polyps    HTN (hypertension)    Hypercholesterolemia    Lung cancer (HCC)    Myelogenous leukemia (HCC)    OA (osteoarthritis)    PAD (peripheral artery disease) (HCC)    Renal insufficiency    Tendonitis    Hyponatremia 09/19/2023   High coronary artery calcium score 05/30/2023   Diuretic-induced hypokalemia 05/23/2023    Class: Diagnosis of   Ascending aortic aneurysm (HCC) 06/27/2022   Thoracic aortic aneurysm (HCC) 06/27/2022   Abnormal transaminases 02/27/2022   History of colonic polyps 10/01/2021   Family history of prostate cancer 10/01/2021   Hypocalcemia 08/27/2021   Adenomatous polyp of descending colon 02/23/2021   History of colon polyps 02/23/2021   Abnormal colonoscopy 02/23/2021   Personal history of gastric ulcers 02/23/2021  Hypercalcemia 08/10/2020   Full thickness rotator cuff tear 08/08/2020   Impingement syndrome of both shoulders 03/31/2020   Shoulder pain, right 03/29/2020   Shoulder pain, left 03/29/2020   Adverse drug reaction,  initial encounter 07/04/2016   Adult BMI 30+ 01/31/2015   Benign fibroma of prostate 01/31/2015   Cataract 01/31/2015   Chronic cough 01/31/2015   Chronic pain 01/31/2015   Arthritis, degenerative 01/31/2015   HLD (hyperlipidemia) 01/31/2015   Cannot sleep 01/31/2015   Non-small cell carcinoma of lung (HCC) 01/31/2015   Compulsive tobacco user syndrome 01/31/2015   Wheezing 01/31/2015   CML (chronic myelocytic leukemia) (HCC) 10/18/2014   Gout 10/18/2014   Cervical osteoarthritis 10/18/2014   Carcinoma of biliary tract (HCC) 02/15/2013   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   Anemia in chronic illness 01/22/2013   COPD with asthma (HCC) 01/22/2013   BP (high blood pressure) 01/22/2013   PCP:  Lars Mage, NP Pharmacy:   [id number] - [place], [place] - [address] [address] Phone: [phone] Fax: [phone]  [facility] - [place], [place] - [address] [facility] [address] [place] [address] Phone: [phone] Fax: [phone]  Social Drivers of Health (SDOH) Social History: SDOH Screenings   Food Insecurity: No Food Insecurity (12/02/2023)  Housing: Low Risk  (12/02/2023)  Transportation Needs: No Transportation Needs (12/02/2023)  Utilities: Not At Risk (12/02/2023)  Social Connections: Moderately Isolated (12/02/2023)  Tobacco Use: Medium Risk (12/02/2023)   SDOH Interventions:    Readmission Risk Interventions    12/03/2023   10:44 AM 10/09/2023    3:26 PM  Readmission Risk Prevention Plan  Transportation Screening Complete Complete  PCP or Specialist Appt within 5-7 Days  Complete  Home Care Screening  Complete  Medication Review (RN CM)  Complete  HRI or Home Care Consult Complete   Social Work Consult for Recovery Care Planning/Counseling Complete   Palliative Care Screening Not Applicable   Medication Review Oceanographer) Complete

## 2023-12-03 NOTE — Progress Notes (Signed)
 PROGRESS NOTE    Kyle Montoya  QMV:[id number] DOB: May 01, 1951 DOA: 12/02/2023 PCP: Lars Mage, NP   Brief Narrative:  This 73 yrs old Male with PMH significant for chronic diastolic heart failure, Paroxysmal Atrial fibrillation, Alcohol abuse, osteoarthritis, hyperlipidemia, history of anemia, ascending aortic aneurysm, history of cholangiocarcinoma, CML, history of lung cancer, COPD  presented in the ED with generalized fatigue and weakness.  Patient was recently treated for pneumonia and also has persistent extremity edema, Patient remained on diuretics. Patient has been hypotensive despite fluid resuscitation, now requiring levophed support. Patient has been moved to ICU.  Assessment & Plan:   Principal Problem:   Hyponatremia Active Problems:   COPD with asthma (HCC)   Gout   HLD (hyperlipidemia)   Ascending aortic aneurysm (HCC)   BPH (benign prostatic hyperplasia)   CAD (coronary artery disease)   Chronic myelogenous leukemia (HCC)   HTN (hypertension)   AKI (acute kidney injury) (HCC)   Hypotension   Alcohol abuse   Hypokalemia   Metabolic acidosis   Shock (HCC)  Hypotensive shock: Patient presented with hypotension, requiring levophed support Continue midodrine. A and OX3 without any mental status changes. Try to wean off levophed as able to ,  Hyponatremia: Appears hypervolemic in setting of Heart failure, CKD Sodium improving 118 > 119 > 122 > 124 Needs close monitoring   Acute kidney injury: Likely related to diuretics and probable decreased intake of fluids Nephrology consulted.  Renal functions now improving. Home medications was 80 mg of Lasix twice a day and 60 mg of Demadex, also on hydrochlorothiazide 12.5.  Avoid hypotension and nephrotoxic medications.   Fluid overload: Still needs aggressive diuresis with close monitoring   History of chronic obstructive pulmonary disease/asthma Continue Bronchodilators On Trelegy at home Started on  Yupelri, Brovana nebulization treatments   Hyperlipidemia: Resume home meds.   Coronary artery disease: Continue aspirin   History of CML: Follows up with oncology as outpatient   History of hypertension: Hold antihypertensives   History of alcohol abuse Continue CIWA as needed Monitor for withdrawal.   History of ascending aortic aneurysm: Follows up with vascular as outpatient.   DVT prophylaxis: Heparin Code Status: Full code Family Communication: No family at bed side. Disposition Plan:    Status is: Inpatient Remains inpatient appropriate because: Hypotensive requiring levophed support.    Consultants:  Nephrology  Procedures: None  Antimicrobials:  Anti-infectives (From admission, onward)    None      Subjective: Patient seen and examined at bedside.  Overnight events noted. He denies any dizziness, shortness of breath. BP remains low, still remains on pressor support.  Objective: Vitals:   12/03/23 1045 12/03/23 1100 12/03/23 1115 12/03/23 1130  BP: 134/84 (!) 140/71 (!) 127/52 104/63  Pulse: 81 80 76 (!) 56  Resp: 10 14 16 13   Temp:      TempSrc:      SpO2: 97% 98% 97% 97%  Weight:      Height:        Intake/Output Summary (Last 24 hours) at 12/03/2023 1135 Last data filed at 12/03/2023 1127 Gross per 24 hour  Intake 915.22 ml  Output 3400 ml  Net -2484.78 ml   Filed Weights   12/02/23 1800 12/03/23 0219  Weight: 93.4 kg 92.4 kg    Examination:  General exam: Appears calm and comfortable, deconditioned, not in any acute distress. Respiratory system: CTA Bilaterally. Respiratory effort normal. RR 15 Cardiovascular system: S1 & S2 heard, RRR. No JVD, murmurs,  rubs, gallops or clicks.  Gastrointestinal system: Abdomen is nondistended, soft and nontender. Normal bowel sounds heard. Central nervous system: Alert and oriented x 3. No focal neurological deficits. Extremities: Edema+, No cyanosis, No clubbing  Skin: No rashes, lesions or  ulcers Psychiatry: Judgement and insight appear normal. Mood & affect appropriate.     Data Reviewed: I have personally reviewed following labs and imaging studies  CBC: Recent Labs  Lab 12/02/23 0219 12/02/23 1552  WBC 10.6* 10.3  HGB 15.4 13.2  HCT 42.5 36.9*  MCV 104.2* 105.7*  PLT 348 316   Basic Metabolic Panel: Recent Labs  Lab 12/02/23 1159 12/02/23 1552 12/02/23 2035 12/03/23 0012 12/03/23 0311 12/03/23 0943  NA 119* 119* 122* 122* 122* 124*  K 3.8 3.6 4.0 3.5 3.5 3.1*  CL 80* 80* 81* 81* 82* 82*  CO2 21* 21* 24 22 22 23   GLUCOSE 119* 156* 168* 164* 170* 184*  BUN 94* 92* 90* 97* 90* 86*  CREATININE 2.73* 2.46* 2.32* 2.22* 1.87* 1.58*  CALCIUM 7.5* 7.2* 7.8* 7.9* 7.9* 8.2*  MG 2.8*  --   --   --   --   --    GFR: Estimated Creatinine Clearance: 47.6 mL/min (A) (by C-G formula based on SCr of 1.58 mg/dL (H)). Liver Function Tests: Recent Labs  Lab 12/02/23 1159  AST 70*  ALT 36  ALKPHOS 71  BILITOT 1.0  PROT 6.9  ALBUMIN 3.3*   No results for input(s): "LIPASE", "AMYLASE" in the last 168 hours. No results for input(s): "AMMONIA" in the last 168 hours. Coagulation Profile: No results for input(s): "INR", "PROTIME" in the last 168 hours. Cardiac Enzymes: Recent Labs  Lab 12/02/23 1159  CKTOTAL 143   BNP (last 3 results) No results for input(s): "PROBNP" in the last 8760 hours. HbA1C: No results for input(s): "HGBA1C" in the last 72 hours. CBG: No results for input(s): "GLUCAP" in the last 168 hours. Lipid Profile: No results for input(s): "CHOL", "HDL", "LDLCALC", "TRIG", "CHOLHDL", "LDLDIRECT" in the last 72 hours. Thyroid Function Tests: Recent Labs    12/02/23 0219  TSH 4.207   Anemia Panel: No results for input(s): "VITAMINB12", "FOLATE", "FERRITIN", "TIBC", "IRON", "RETICCTPCT" in the last 72 hours. Sepsis Labs: No results for input(s): "PROCALCITON", "LATICACIDVEN" in the last 168 hours.  Recent Results (from the past 240  hours)  MRSA Next Gen by PCR, Nasal     Status: None   Collection Time: 12/02/23 12:02 AM   Specimen: Nasal Mucosa; Nasal Swab  Result Value Ref Range Status   MRSA by PCR Next Gen NOT DETECTED NOT DETECTED Final    Comment: (NOTE) The GeneXpert MRSA Assay (FDA approved for NASAL specimens only), is one component of a comprehensive MRSA colonization surveillance program. It is not intended to diagnose MRSA infection nor to guide or monitor treatment for MRSA infections. Test performance is not FDA approved in patients less than 60 years old. Performed at [facility], [address] [place], [place] [address]   Resp panel by RT-PCR (RSV, Flu A&B, Covid) Anterior Nasal Swab     Status: None   Collection Time: 12/02/23  2:28 AM   Specimen: Anterior Nasal Swab  Result Value Ref Range Status   SARS Coronavirus 2 by RT PCR NEGATIVE NEGATIVE Final    Comment: (NOTE) SARS-CoV-2 target nucleic acids are NOT DETECTED.  The SARS-CoV-2 RNA is generally detectable in upper respiratory specimens during the acute phase of infection. The lowest concentration of SARS-CoV-2  viral copies this assay can detect is 138 copies/mL. A negative result does not preclude SARS-Cov-2 infection and should not be used as the sole basis for treatment or other patient management decisions. A negative result may occur with  improper specimen collection/handling, submission of specimen other than nasopharyngeal swab, presence of viral mutation(s) within the areas targeted by this assay, and inadequate number of viral copies(<138 copies/mL). A negative result must be combined with clinical observations, patient history, and epidemiological information. The expected result is Negative.  Fact Sheet for Patients:  BloggerCourse.com  Fact Sheet for Healthcare Providers:  SeriousBroker.it  This test is no t yet approved or cleared by the [place] FDA and  has been authorized for detection and/or diagnosis of SARS-CoV-2 by FDA under an Emergency Use Authorization (EUA). This EUA will remain  in effect (meaning this test can be used) for the duration of the COVID-19 declaration under Section 564(b)(1) of the Act, 21 U.S.C.section 360bbb-3(b)(1), unless the authorization is terminated  or revoked sooner.       Influenza A by PCR NEGATIVE NEGATIVE Final   Influenza B by PCR NEGATIVE NEGATIVE Final    Comment: (NOTE) The Xpert Xpress SARS-CoV-2/FLU/RSV plus assay is intended as an aid in the diagnosis of influenza from Nasopharyngeal swab specimens and should not be used as a sole basis for treatment. Nasal washings and aspirates are unacceptable for Xpert Xpress SARS-CoV-2/FLU/RSV testing.  Fact Sheet for Patients: BloggerCourse.com  Fact Sheet for Healthcare Providers: SeriousBroker.it  This test is not yet approved or cleared by the [place] FDA and has been authorized for detection and/or diagnosis of SARS-CoV-2 by FDA under an Emergency Use Authorization (EUA). This EUA will remain in effect (meaning this test can be used) for the duration of the COVID-19 declaration under Section 564(b)(1) of the Act, 21 U.S.C. section 360bbb-3(b)(1), unless the authorization is terminated or revoked.     Resp Syncytial Virus by PCR NEGATIVE NEGATIVE Final    Comment: (NOTE) Fact Sheet for Patients: BloggerCourse.com  Fact Sheet for Healthcare Providers: SeriousBroker.it  This test is not yet approved or cleared by the [place] FDA and has been authorized for detection and/or diagnosis of SARS-CoV-2 by FDA under an Emergency Use Authorization (EUA). This EUA will remain in effect (meaning this test can be used) for the duration of the COVID-19 declaration under Section 564(b)(1) of the Act, 21 U.S.C. section  360bbb-3(b)(1), unless the authorization is terminated or revoked.  Performed at [facility], [address], [place], [place] [address]     Radiology Studies: DG Lumbar Spine 2-3 Views Result Date: 12/02/2023 CLINICAL DATA:  Low back pain. EXAM: LUMBAR SPINE - 2-3 VIEW COMPARISON:  07/17/2023. FINDINGS: There is no evidence of acute lumbar spine fracture. Alignment is normal. Multilevel intervertebral disc space narrowing, degenerative endplate changes, and facet arthropathy are noted. IMPRESSION: 1. No acute fracture. 2. Multilevel degenerative changes. Electronically Signed   By: Thornell Sartorius M.D.   On: 12/02/2023 22:11   ECHOCARDIOGRAM COMPLETE Result Date: 12/02/2023    ECHOCARDIOGRAM REPORT   Patient Name:   CAYLAN SCHIFANO Date of Exam: 12/02/2023 Medical Rec #:  [id number]      Height:       69.0 in Accession #:    [id number]     Weight:       208.2 lb Date of Birth:  [birth year]      BSA:          2.102 m Patient  Age:    73 years       BP:           92/60 mmHg Patient Gender: M              HR:           71 bpm. Exam Location:  Inpatient Procedure: 2D Echo, Color Doppler and Cardiac Doppler (Both Spectral and Color            Flow Doppler were utilized during procedure). Indications:    CHF Acute Diastolic I50.31  History:        Patient has no prior history of Echocardiogram examinations.                 CHF, CAD, COPD; Risk Factors:Dyslipidemia and Hypertension.  Sonographer:    Harriette Bouillon RDCS Referring Phys: [phone] DAVID MANUEL ORTIZ IMPRESSIONS  1. Left ventricular ejection fraction, by estimation, is 65 to 70%. The left ventricle has normal function. The left ventricle has no regional wall motion abnormalities. Left ventricular diastolic parameters are consistent with Grade I diastolic dysfunction (impaired relaxation).  2. Right ventricular systolic function is normal. The right ventricular size is normal.  3. The mitral valve is normal in structure. No evidence of  mitral valve regurgitation. No evidence of mitral stenosis.  4. The aortic valve is tricuspid. There is mild calcification of the aortic valve. Aortic valve regurgitation is not visualized. No aortic stenosis is present.  5. The inferior vena cava is normal in size with greater than 50% respiratory variability, suggesting right atrial pressure of 3 mmHg. FINDINGS  Left Ventricle: Left ventricular ejection fraction, by estimation, is 65 to 70%. The left ventricle has normal function. The left ventricle has no regional wall motion abnormalities. The left ventricular internal cavity size was normal in size. There is  no left ventricular hypertrophy. Left ventricular diastolic parameters are consistent with Grade I diastolic dysfunction (impaired relaxation). Right Ventricle: The right ventricular size is normal. No increase in right ventricular wall thickness. Right ventricular systolic function is normal. Left Atrium: Left atrial size was normal in size. Right Atrium: Right atrial size was normal in size. Pericardium: There is no evidence of pericardial effusion. Mitral Valve: The mitral valve is normal in structure. There is mild calcification of the mitral valve leaflet(s). No evidence of mitral valve regurgitation. No evidence of mitral valve stenosis. Tricuspid Valve: The tricuspid valve is normal in structure. Tricuspid valve regurgitation is not demonstrated. No evidence of tricuspid stenosis. Aortic Valve: The aortic valve is tricuspid. There is mild calcification of the aortic valve. Aortic valve regurgitation is not visualized. No aortic stenosis is present. Pulmonic Valve: The pulmonic valve was normal in structure. Pulmonic valve regurgitation is trivial. No evidence of pulmonic stenosis. Aorta: The aortic root is normal in size and structure. Venous: The inferior vena cava is normal in size with greater than 50% respiratory variability, suggesting right atrial pressure of 3 mmHg. IAS/Shunts: No atrial  level shunt detected by color flow Doppler.  LEFT VENTRICLE PLAX 2D LVIDd:         4.80 cm   Diastology LVIDs:         3.10 cm   LV e' medial:    5.66 cm/s LV PW:         1.20 cm   LV E/e' medial:  13.7 LV IVS:        1.00 cm   LV e' lateral:   6.53 cm/s LVOT diam:  1.90 cm   LV E/e' lateral: 11.9 LV SV:         56 LV SV Index:   27 LVOT Area:     2.84 cm  LEFT ATRIUM             Index LA diam:        2.90 cm 1.38 cm/m LA Vol (A2C):   41.1 ml 19.56 ml/m LA Vol (A4C):   44.7 ml 21.27 ml/m LA Biplane Vol: 43.7 ml 20.79 ml/m  AORTIC VALVE LVOT Vmax:   114.00 cm/s LVOT Vmean:  83.700 cm/s LVOT VTI:    0.199 m  AORTA Ao Root diam: 3.70 cm Ao Asc diam:  3.70 cm MITRAL VALVE MV Area (PHT): 2.82 cm    SHUNTS MV E velocity: 77.80 cm/s  Systemic VTI:  0.20 m MV A velocity: 84.60 cm/s  Systemic Diam: 1.90 cm MV E/A ratio:  0.92 Arvilla Meres MD Electronically signed by Arvilla Meres MD Signature Date/Time: 12/02/2023/1:28:06 PM    Final    US RENAL Result Date: 12/02/2023 CLINICAL DATA:  Acute kidney injury. EXAM: RENAL / URINARY TRACT ULTRASOUND COMPLETE COMPARISON:  CT scan abdomen and pelvis from 07/17/2023. FINDINGS: Right Kidney: Renal measurements: 5.2 x 5.3 x 11.0 cm. = volume: 160.0 mL. Echogenicity within normal limits. No mass or hydronephrosis visualized. Left Kidney: Renal measurements: 5.7 x 6.1 x 10.8 cm. = volume: 195.4 mL. There is diffuse increased cortical echogenicity, nonspecific but commonly seen with medical renal disease. No mass or hydronephrosis. Bladder: Appears normal for degree of bladder distention. Other: None. IMPRESSION: *Diffuse increased cortical echogenicity of the left kidney, nonspecific but commonly seen with medical renal disease. Otherwise unremarkable exam. No hydronephrosis. Electronically Signed   By: Jules Schick M.D.   On: 12/02/2023 12:52   DG Chest 2 View Result Date: 12/02/2023 CLINICAL DATA:  Recent pneumonia EXAM: CHEST - 2 VIEW COMPARISON:  10/21/2023  FINDINGS: Mild residual right basilar opacity, with improved aeration since 10/21/2023. Unchanged small right pleural effusion. Normal cardiomediastinal contours. IMPRESSION: 1. Mild residual right basilar opacity, with improved aeration since 10/21/2023. 2. Unchanged small right pleural effusion. Electronically Signed   By: Deatra Robinson M.D.   On: 12/02/2023 04:05   Scheduled Meds:  arformoterol  15 mcg Nebulization BID   Chlorhexidine Gluconate Cloth  6 each Topical Daily   folic acid  1 mg Oral Daily   heparin  5,000 Units Subcutaneous Q8H   midodrine  10 mg Oral TID WC   multivitamin with minerals  1 tablet Oral Daily   potassium chloride  40 mEq Oral Q4H   revefenacin  175 mcg Nebulization Daily   thiamine  100 mg Oral Daily   Or   thiamine  100 mg Intravenous Daily   Continuous Infusions:  sodium chloride     furosemide Stopped (12/03/23 0959)   norepinephrine (LEVOPHED) Adult infusion 9 mcg/min (12/03/23 1127)     LOS: 1 day    Time spent: 50 mins    Willeen Niece, MD [facility]   If 7PM-7AM, please contact night-coverage

## 2023-12-03 NOTE — Plan of Care (Signed)
  Problem: Activity: Goal: Risk for activity intolerance will decrease Outcome: Progressing   Problem: Coping: Goal: Level of anxiety will decrease Outcome: Progressing   Problem: Elimination: Goal: Will not experience complications related to bowel motility Outcome: Progressing   Problem: Pain Managment: Goal: General experience of comfort will improve and/or be controlled Outcome: Progressing   Problem: Safety: Goal: Ability to remain free from injury will improve Outcome: Progressing

## 2023-12-04 DIAGNOSIS — I509 Heart failure, unspecified: Secondary | ICD-10-CM | POA: Diagnosis not present

## 2023-12-04 DIAGNOSIS — E871 Hypo-osmolality and hyponatremia: Secondary | ICD-10-CM | POA: Diagnosis not present

## 2023-12-04 DIAGNOSIS — I959 Hypotension, unspecified: Secondary | ICD-10-CM | POA: Diagnosis not present

## 2023-12-04 DIAGNOSIS — N179 Acute kidney failure, unspecified: Secondary | ICD-10-CM | POA: Diagnosis not present

## 2023-12-04 LAB — BASIC METABOLIC PANEL
Anion gap: 11 (ref 5–15)
Anion gap: 10 (ref 5–15)
Anion gap: 12 (ref 5–15)
BUN: 74 mg/dL — ABNORMAL HIGH (ref 8–23)
BUN: 75 mg/dL — ABNORMAL HIGH (ref 8–23)
BUN: 75 mg/dL — ABNORMAL HIGH (ref 8–23)
CO2: 28 mmol/L (ref 22–32)
CO2: 32 mmol/L (ref 22–32)
CO2: 29 mmol/L (ref 22–32)
Calcium: 8.5 mg/dL — ABNORMAL LOW (ref 8.9–10.3)
Calcium: 8.9 mg/dL (ref 8.9–10.3)
Calcium: 9 mg/dL (ref 8.9–10.3)
Chloride: 85 mmol/L — ABNORMAL LOW (ref 98–111)
Chloride: 84 mmol/L — ABNORMAL LOW (ref 98–111)
Chloride: 85 mmol/L — ABNORMAL LOW (ref 98–111)
Creatinine, Ser: 1.22 mg/dL (ref 0.61–1.24)
Creatinine, Ser: 1.26 mg/dL — ABNORMAL HIGH (ref 0.61–1.24)
Creatinine, Ser: 1.24 mg/dL (ref 0.61–1.24)
GFR, Estimated: >60 mL/min (ref >60)
GFR, Estimated: 60 mL/min — ABNORMAL LOW (ref >60)
GFR, Estimated: >60 mL/min (ref >60)
Glucose, Bld: 164 mg/dL — ABNORMAL HIGH (ref 70–99)
Glucose, Bld: 152 mg/dL — ABNORMAL HIGH (ref 70–99)
Glucose, Bld: 141 mg/dL — ABNORMAL HIGH (ref 70–99)
Potassium: 3.8 mmol/L (ref 3.5–5.1)
Potassium: 3.5 mmol/L (ref 3.5–5.1)
Potassium: 3.9 mmol/L (ref 3.5–5.1)
Sodium: 124 mmol/L — ABNORMAL LOW (ref 135–145)
Sodium: 126 mmol/L — ABNORMAL LOW (ref 135–145)
Sodium: 126 mmol/L — ABNORMAL LOW (ref 135–145)

## 2023-12-04 LAB — OSMOLALITY, URINE: Osmolality, Ur: 309 mosm/kg (ref 300–900)

## 2023-12-04 LAB — GLUCOSE, CAPILLARY: Glucose-Capillary: 156 mg/dL — ABNORMAL HIGH (ref 70–99)

## 2023-12-04 LAB — SODIUM, URINE, RANDOM: Sodium, Ur: 12 mmol/L

## 2023-12-04 LAB — BRAIN NATRIURETIC PEPTIDE: B Natriuretic Peptide: 319.4 pg/mL — ABNORMAL HIGH (ref 0.0–100.0)

## 2023-12-04 MED ORDER — POTASSIUM CHLORIDE CRYS ER 20 MEQ PO TBCR: 40.0000 meq | EXTENDED_RELEASE_TABLET | Freq: Once | ORAL | Status: DC

## 2023-12-04 MED ORDER — MIDODRINE HCL 5 MG PO TABS
15.0000 mg | ORAL_TABLET | Freq: Three times a day (TID) | ORAL | Status: DC
  Administered 2023-12-04 – 2023-12-11 (×20): 15 mg via ORAL
  Filled 2023-12-04 (×19): qty 3

## 2023-12-04 MED ORDER — FUROSEMIDE 10 MG/ML IJ SOLN
120.0000 mg | Freq: Four times a day (QID) | INTRAVENOUS | Status: AC
  Administered 2023-12-04 (×2): 120 mg via INTRAVENOUS
  Filled 2023-12-04 (×2): qty 10

## 2023-12-04 MED ORDER — UREA 15 G PO PACK
15.0000 g | PACK | Freq: Two times a day (BID) | ORAL | Status: DC
  Administered 2023-12-04 – 2023-12-05 (×4): 15 g via ORAL
  Filled 2023-12-04 (×7): qty 1

## 2023-12-04 NOTE — Progress Notes (Signed)
 Confused, pulling out monitor lines, disconnected IV line.  Reoriented and applied mittens on.

## 2023-12-04 NOTE — Progress Notes (Signed)
 PROGRESS NOTE    Kyle Montoya  ZOX:[id number] DOB: 1951-08-23 DOA: 12/02/2023 PCP: Lars Mage, NP   Brief Narrative:  This 73 yrs old Male with PMH significant for chronic diastolic heart failure, Paroxysmal Atrial fibrillation, Alcohol abuse, osteoarthritis, hyperlipidemia, history of anemia, ascending aortic aneurysm, history of cholangiocarcinoma, CML, history of lung cancer, COPD  presented in the ED with generalized fatigue and weakness.  Patient was recently treated for pneumonia and also has persistent extremity edema, Patient remained on diuretics. Patient has been hypotensive despite fluid resuscitation, now requiring levophed support. Patient has been moved to ICU.  Assessment & Plan:   Principal Problem:   Hyponatremia Active Problems:   COPD with asthma (HCC)   Gout   HLD (hyperlipidemia)   Ascending aortic aneurysm (HCC)   BPH (benign prostatic hyperplasia)   CAD (coronary artery disease)   Chronic myelogenous leukemia (HCC)   HTN (hypertension)   AKI (acute kidney injury) (HCC)   Hypotension   Alcohol abuse   Hypokalemia   Metabolic acidosis   Shock (HCC)  Hypotensive shock: Patient presented with hypotension, requiring levophed support Continue midodrine. A and OX3 without any mental status changes. Try to wean off levophed as able to , Midodrine increased to 15 mg TID.  Hyponatremia: Appears hypervolemic in setting of Heart failure, CKD Sodium improving 118 > 119 > 122 > 124 Needs close monitoring   Acute kidney injury: Likely related to diuretics and probable decreased intake of fluids Nephrology consulted.  Renal functions now improving. Home medications was 80 mg of Lasix twice a day and 60 mg of Demadex, also on hydrochlorothiazide 12.5.  Avoid hypotension and nephrotoxic medications.   Fluid overload: Still needs aggressive diuresis with close monitoring. He is close to getting euvolumic   History of chronic obstructive pulmonary  disease/asthma Continue Bronchodilators On Trelegy at home Continue Roxy Manns nebulization treatments   Hyperlipidemia: Resume home meds.   Coronary artery disease: Continue aspirin.   History of CML: Follows up with oncology as outpatient   History of hypertension: Hold antihypertensives.   History of alcohol abuse Continue CIWA as needed Monitor for withdrawal.   History of ascending aortic aneurysm: Follows up with vascular as outpatient.   DVT prophylaxis: Heparin Code Status: Full code Family Communication: No family at bed side. Disposition Plan:    Status is: Inpatient Remains inpatient appropriate because: Hypotensive requiring levophed support.    Consultants:  Nephrology  Procedures: None  Antimicrobials:  Anti-infectives (From admission, onward)    None      Subjective: Patient seen and examined at bedside.  Overnight events noted. He denies any dizziness, shortness of breath. BP remains low, still remains on pressor support.  Objective: Vitals:   12/04/23 0756 12/04/23 0800 12/04/23 0900 12/04/23 1200  BP:  122/60 128/60   Pulse:  67 72   Resp:  14 15   Temp:  98 F (36.7 C)  (!) 97.4 F (36.3 C)  TempSrc:  Axillary  Oral  SpO2: 95% 100% 97%   Weight:      Height:        Intake/Output Summary (Last 24 hours) at 12/04/2023 1242 Last data filed at 12/04/2023 1025 Gross per 24 hour  Intake 889.19 ml  Output 1925 ml  Net -1035.81 ml   Filed Weights   12/03/23 0219 12/04/23 0100 12/04/23 0548  Weight: 92.4 kg 87.1 kg 87.9 kg    Examination:  General exam: Appears comfortable, deconditioned, not in any acute distress. Respiratory system:  CTA Bilaterally. Respiratory effort normal. RR 15 Cardiovascular system: S1 & S2 heard, RRR. No JVD, murmurs, rubs, gallops or clicks.  Gastrointestinal system: Abdomen is nondistended, soft and nontender. Normal bowel sounds heard. Central nervous system: Alert and oriented x 3. No focal  neurological deficits. Extremities: Edema+, No cyanosis, No clubbing  Skin: No rashes, lesions or ulcers Psychiatry: Judgement and insight appear normal. Mood & affect appropriate.     Data Reviewed: I have personally reviewed following labs and imaging studies  CBC: Recent Labs  Lab 12/02/23 0219 12/02/23 1552  WBC 10.6* 10.3  HGB 15.4 13.2  HCT 42.5 36.9*  MCV 104.2* 105.7*  PLT 348 316   Basic Metabolic Panel: Recent Labs  Lab 12/02/23 1159 12/02/23 1552 12/03/23 0311 12/03/23 0943 12/03/23 1356 12/03/23 2008 12/04/23 0810  NA 119*   < > 122* 124* 123* 122* 124*  K 3.8   < > 3.5 3.1* 3.5 3.5 3.8  CL 80*   < > 82* 82* 80* 81* 85*  CO2 21*   < > 22 23 30 28 28   GLUCOSE 119*   < > 170* 184* 203* 179* 164*  BUN 94*   < > 90* 86* 78* 78* 74*  CREATININE 2.73*   < > 1.87* 1.58* 1.49* 1.37* 1.22  CALCIUM 7.5*   < > 7.9* 8.2* 8.6* 8.5* 8.5*  MG 2.8*  --   --   --   --   --   --    < > = values in this interval not displayed.   GFR: Estimated Creatinine Clearance: 56.6 mL/min (by C-G formula based on SCr of 1.22 mg/dL). Liver Function Tests: Recent Labs  Lab 12/02/23 1159  AST 70*  ALT 36  ALKPHOS 71  BILITOT 1.0  PROT 6.9  ALBUMIN 3.3*   No results for input(s): "LIPASE", "AMYLASE" in the last 168 hours. No results for input(s): "AMMONIA" in the last 168 hours. Coagulation Profile: No results for input(s): "INR", "PROTIME" in the last 168 hours. Cardiac Enzymes: Recent Labs  Lab 12/02/23 1159  CKTOTAL 143   BNP (last 3 results) No results for input(s): "PROBNP" in the last 8760 hours. HbA1C: No results for input(s): "HGBA1C" in the last 72 hours. CBG: Recent Labs  Lab 12/04/23 0843  GLUCAP 156*   Lipid Profile: No results for input(s): "CHOL", "HDL", "LDLCALC", "TRIG", "CHOLHDL", "LDLDIRECT" in the last 72 hours. Thyroid Function Tests: Recent Labs    12/02/23 0219  TSH 4.207   Anemia Panel: No results for input(s): "VITAMINB12", "FOLATE",  "FERRITIN", "TIBC", "IRON", "RETICCTPCT" in the last 72 hours. Sepsis Labs: No results for input(s): "PROCALCITON", "LATICACIDVEN" in the last 168 hours.  Recent Results (from the past 240 hours)  MRSA Next Gen by PCR, Nasal     Status: None   Collection Time: 12/02/23 12:02 AM   Specimen: Nasal Mucosa; Nasal Swab  Result Value Ref Range Status   MRSA by PCR Next Gen NOT DETECTED NOT DETECTED Final    Comment: (NOTE) The GeneXpert MRSA Assay (FDA approved for NASAL specimens only), is one component of a comprehensive MRSA colonization surveillance program. It is not intended to diagnose MRSA infection nor to guide or monitor treatment for MRSA infections. Test performance is not FDA approved in patients less than 77 years old. Performed at [facility], [address] [place], [place] [address]   Resp panel by RT-PCR (RSV, Flu A&B, Covid) Anterior Nasal Swab     Status: None  Collection Time: 12/02/23  2:28 AM   Specimen: Anterior Nasal Swab  Result Value Ref Range Status   SARS Coronavirus 2 by RT PCR NEGATIVE NEGATIVE Final    Comment: (NOTE) SARS-CoV-2 target nucleic acids are NOT DETECTED.  The SARS-CoV-2 RNA is generally detectable in upper respiratory specimens during the acute phase of infection. The lowest concentration of SARS-CoV-2 viral copies this assay can detect is 138 copies/mL. A negative result does not preclude SARS-Cov-2 infection and should not be used as the sole basis for treatment or other patient management decisions. A negative result may occur with  improper specimen collection/handling, submission of specimen other than nasopharyngeal swab, presence of viral mutation(s) within the areas targeted by this assay, and inadequate number of viral copies(<138 copies/mL). A negative result must be combined with clinical observations, patient history, and epidemiological information. The expected result is Negative.  Fact Sheet for  Patients:  BloggerCourse.com  Fact Sheet for Healthcare Providers:  SeriousBroker.it  This test is no t yet approved or cleared by the [place] FDA and  has been authorized for detection and/or diagnosis of SARS-CoV-2 by FDA under an Emergency Use Authorization (EUA). This EUA will remain  in effect (meaning this test can be used) for the duration of the COVID-19 declaration under Section 564(b)(1) of the Act, 21 U.S.C.section 360bbb-3(b)(1), unless the authorization is terminated  or revoked sooner.       Influenza A by PCR NEGATIVE NEGATIVE Final   Influenza B by PCR NEGATIVE NEGATIVE Final    Comment: (NOTE) The Xpert Xpress SARS-CoV-2/FLU/RSV plus assay is intended as an aid in the diagnosis of influenza from Nasopharyngeal swab specimens and should not be used as a sole basis for treatment. Nasal washings and aspirates are unacceptable for Xpert Xpress SARS-CoV-2/FLU/RSV testing.  Fact Sheet for Patients: BloggerCourse.com  Fact Sheet for Healthcare Providers: SeriousBroker.it  This test is not yet approved or cleared by the [place] FDA and has been authorized for detection and/or diagnosis of SARS-CoV-2 by FDA under an Emergency Use Authorization (EUA). This EUA will remain in effect (meaning this test can be used) for the duration of the COVID-19 declaration under Section 564(b)(1) of the Act, 21 U.S.C. section 360bbb-3(b)(1), unless the authorization is terminated or revoked.     Resp Syncytial Virus by PCR NEGATIVE NEGATIVE Final    Comment: (NOTE) Fact Sheet for Patients: BloggerCourse.com  Fact Sheet for Healthcare Providers: SeriousBroker.it  This test is not yet approved or cleared by the [place] FDA and has been authorized for detection and/or diagnosis of SARS-CoV-2 by FDA under an Emergency  Use Authorization (EUA). This EUA will remain in effect (meaning this test can be used) for the duration of the COVID-19 declaration under Section 564(b)(1) of the Act, 21 U.S.C. section 360bbb-3(b)(1), unless the authorization is terminated or revoked.  Performed at [facility], [address], [place], [place] [address]     Radiology Studies: DG Lumbar Spine 2-3 Views Result Date: 12/02/2023 CLINICAL DATA:  Low back pain. EXAM: LUMBAR SPINE - 2-3 VIEW COMPARISON:  07/17/2023. FINDINGS: There is no evidence of acute lumbar spine fracture. Alignment is normal. Multilevel intervertebral disc space narrowing, degenerative endplate changes, and facet arthropathy are noted. IMPRESSION: 1. No acute fracture. 2. Multilevel degenerative changes. Electronically Signed   By: Thornell Sartorius M.D.   On: 12/02/2023 22:11   ECHOCARDIOGRAM COMPLETE Result Date: 12/02/2023    ECHOCARDIOGRAM REPORT   Patient Name:   Kyle Montoya Date of  Exam: 12/02/2023 Medical Rec #:  [id number]      Height:       69.0 in Accession #:    [id number]     Weight:       208.2 lb Date of Birth:  Jul 03, 1951      BSA:          2.102 m Patient Age:    73 years       BP:           92/60 mmHg Patient Gender: M              HR:           71 bpm. Exam Location:  Inpatient Procedure: 2D Echo, Color Doppler and Cardiac Doppler (Both Spectral and Color            Flow Doppler were utilized during procedure). Indications:    CHF Acute Diastolic I50.31  History:        Patient has no prior history of Echocardiogram examinations.                 CHF, CAD, COPD; Risk Factors:Dyslipidemia and Hypertension.  Sonographer:    Harriette Bouillon RDCS Referring Phys: [phone] DAVID MANUEL ORTIZ IMPRESSIONS  1. Left ventricular ejection fraction, by estimation, is 65 to 70%. The left ventricle has normal function. The left ventricle has no regional wall motion abnormalities. Left ventricular diastolic parameters are consistent with Grade I  diastolic dysfunction (impaired relaxation).  2. Right ventricular systolic function is normal. The right ventricular size is normal.  3. The mitral valve is normal in structure. No evidence of mitral valve regurgitation. No evidence of mitral stenosis.  4. The aortic valve is tricuspid. There is mild calcification of the aortic valve. Aortic valve regurgitation is not visualized. No aortic stenosis is present.  5. The inferior vena cava is normal in size with greater than 50% respiratory variability, suggesting right atrial pressure of 3 mmHg. FINDINGS  Left Ventricle: Left ventricular ejection fraction, by estimation, is 65 to 70%. The left ventricle has normal function. The left ventricle has no regional wall motion abnormalities. The left ventricular internal cavity size was normal in size. There is  no left ventricular hypertrophy. Left ventricular diastolic parameters are consistent with Grade I diastolic dysfunction (impaired relaxation). Right Ventricle: The right ventricular size is normal. No increase in right ventricular wall thickness. Right ventricular systolic function is normal. Left Atrium: Left atrial size was normal in size. Right Atrium: Right atrial size was normal in size. Pericardium: There is no evidence of pericardial effusion. Mitral Valve: The mitral valve is normal in structure. There is mild calcification of the mitral valve leaflet(s). No evidence of mitral valve regurgitation. No evidence of mitral valve stenosis. Tricuspid Valve: The tricuspid valve is normal in structure. Tricuspid valve regurgitation is not demonstrated. No evidence of tricuspid stenosis. Aortic Valve: The aortic valve is tricuspid. There is mild calcification of the aortic valve. Aortic valve regurgitation is not visualized. No aortic stenosis is present. Pulmonic Valve: The pulmonic valve was normal in structure. Pulmonic valve regurgitation is trivial. No evidence of pulmonic stenosis. Aorta: The aortic root is  normal in size and structure. Venous: The inferior vena cava is normal in size with greater than 50% respiratory variability, suggesting right atrial pressure of 3 mmHg. IAS/Shunts: No atrial level shunt detected by color flow Doppler.  LEFT VENTRICLE PLAX 2D LVIDd:         4.80 cm   Diastology  LVIDs:         3.10 cm   LV e' medial:    5.66 cm/s LV PW:         1.20 cm   LV E/e' medial:  13.7 LV IVS:        1.00 cm   LV e' lateral:   6.53 cm/s LVOT diam:     1.90 cm   LV E/e' lateral: 11.9 LV SV:         56 LV SV Index:   27 LVOT Area:     2.84 cm  LEFT ATRIUM             Index LA diam:        2.90 cm 1.38 cm/m LA Vol (A2C):   41.1 ml 19.56 ml/m LA Vol (A4C):   44.7 ml 21.27 ml/m LA Biplane Vol: 43.7 ml 20.79 ml/m  AORTIC VALVE LVOT Vmax:   114.00 cm/s LVOT Vmean:  83.700 cm/s LVOT VTI:    0.199 m  AORTA Ao Root diam: 3.70 cm Ao Asc diam:  3.70 cm MITRAL VALVE MV Area (PHT): 2.82 cm    SHUNTS MV E velocity: 77.80 cm/s  Systemic VTI:  0.20 m MV A velocity: 84.60 cm/s  Systemic Diam: 1.90 cm MV E/A ratio:  0.92 Arvilla Meres MD Electronically signed by Arvilla Meres MD Signature Date/Time: 12/02/2023/1:28:06 PM    Final    Scheduled Meds:  arformoterol  15 mcg Nebulization BID   Chlorhexidine Gluconate Cloth  6 each Topical Daily   folic acid  1 mg Oral Daily   heparin  5,000 Units Subcutaneous Q8H   midodrine  15 mg Oral TID WC   multivitamin with minerals  1 tablet Oral Daily   revefenacin  175 mcg Nebulization Daily   thiamine  100 mg Oral Daily   Or   thiamine  100 mg Intravenous Daily   urea  15 g Oral BID   Continuous Infusions:  furosemide Stopped (12/04/23 1039)   norepinephrine (LEVOPHED) Adult infusion 5 mcg/min (12/04/23 0900)     LOS: 2 days    Time spent: 50 mins    Willeen Niece, MD [facility]   If 7PM-7AM, please contact night-coverage

## 2023-12-04 NOTE — Progress Notes (Deleted)
 [facility] ADULT ICU REPLACEMENT PROTOCOL   The patient does apply for the [facility] Adult ICU Electrolyte Replacment Protocol based on the criteria listed below:   1.Exclusion criteria: TCTS, ECMO, Dialysis, and Myasthenia Gravis patients 2. Is GFR >/= 30 ml/min? Yes.    Patient's GFR today is 54 3. Is SCr </= 2? Yes.   Patient's SCr is 1.37 mg/dL 4. Did SCr increase >/= 0.5 in 24 hours? No. 5.Pt's weight >40kg  Yes.   6. Abnormal electrolyte(s): potassium 3.5  7. Electrolytes replaced per protocol 8.  Call MD STAT for K+ </= 2.5, Phos </= 1, or Mag </= 1 Physician:  protocol  Melvern Banker 12/04/2023 3:58 AM

## 2023-12-04 NOTE — Plan of Care (Signed)
  Problem: Clinical Measurements: Goal: Respiratory complications will improve Outcome: Not Progressing Goal: Cardiovascular complication will be avoided Outcome: Progressing   Problem: Activity: Goal: Risk for activity intolerance will decrease Outcome: Progressing   Problem: Nutrition: Goal: Adequate nutrition will be maintained Outcome: Not Progressing   Problem: Coping: Goal: Level of anxiety will decrease Outcome: Not Progressing   Problem: Elimination: Goal: Will not experience complications related to urinary retention Outcome: Progressing

## 2023-12-04 NOTE — Progress Notes (Signed)
 Nephrology Follow-Up Consult note   Assessment/Recommendations: Kyle Montoya is a/an 73 y.o. male with a past medical history significant for Alcohol use disorder, CHF, atrial fibrillation, BPH, CAD, HTN CML, lung cancer, COPD, H LD, who presents with Heart failure exacerbation, acute severe hyponatremia, AKI, shock   AKI: Crt normal at baseline. Llikely ATN and possibly cardiorenal -improving quickly with blood pressure support -Continue to monitor daily Cr, Dose meds for GFR -Monitor Daily I/Os, Daily weight  -Maintain MAP>65 for optimal renal perfusion.  -Avoid nephrotoxic medications including NSAIDs -Use synthetic opioids (Fentanyl/Dilaudid) if needed -RUS without obstruction   Acute heart failure exacerbation:With hyponatremia as below.  Volume excess contributing to hyponatremia.  Good urine output and creatinine improving.  Continue with IV Lasix today but probably slow down tomorrow as we are likely getting close to euvolemia   Acute asymptomatic hyponatremia: Contributions from thiazide, volume excess, alcohol intake.  Was improving yesterday now stable today.  Repeat urine lites before administering Lasix today.  Monitor sodium closely during the day.  His altered mental status is likely multifactorial with sodium contributing although the sodium has gotten better I do not suspect his worsening altered mental status is from this.  Could consider hypertonic saline if needed throughout the day particularly if mental status worsens.  Started urea twice daily   Hypokalemia: Continue to aggressively correct as hypokalemia can make osmotic demyelination more of a concern particularly in alcoholics.   Shock: Possibly related to home blood pressure medications as well as some cardiogenic component.  Continue pressors per primary team   Alcohol use disorder: Management per primary team   Hypertension: Hold home blood pressure medications.  Will not be able to take hydrochlorothiazide  again   Recommendations conveyed to primary service.    Darnell Level [facility] 12/04/2023 9:15 AM  ___________________________________________________________  CC: shortness of breath  Interval History/Subjective: Creatinine continues to improve.  Good urine output.  Sodium stable at 122.  Patient more altered today minimally interactive.  Blood pressure remains low on norepinephrine.   Medications:  Current Facility-Administered Medications  Medication Dose Route Frequency Provider Last Rate Last Admin   acetaminophen (TYLENOL) tablet 650 mg  650 mg Oral Q6H PRN Howerter, Justin B, DO   650 mg at 12/04/23 0446   Or   acetaminophen (TYLENOL) suppository 650 mg  650 mg Rectal Q6H PRN Howerter, Justin B, DO       albuterol (PROVENTIL) (2.5 MG/3ML) 0.083% nebulizer solution 2.5 mg  2.5 mg Nebulization Q4H PRN Bobette Mo, MD   2.5 mg at 12/03/23 2339   arformoterol (BROVANA) nebulizer solution 15 mcg  15 mcg Nebulization BID Olalere, Adewale A, MD   15 mcg at 12/04/23 0754   Chlorhexidine Gluconate Cloth 2 % PADS 6 each  6 each Topical Daily Olalere, Adewale A, MD   6 each at 12/03/23 2200   docusate sodium (COLACE) capsule 100 mg  100 mg Oral BID PRN Virl Diamond A, MD       folic acid (FOLVITE) tablet 1 mg  1 mg Oral Daily Howerter, Justin B, DO   1 mg at 12/03/23 0921   furosemide (LASIX) 120 mg in dextrose 5 % 50 mL IVPB  120 mg Intravenous Q6H Darnell Level, MD 62 mL/hr at 12/04/23 0901 120 mg at 12/04/23 0901   heparin injection 5,000 Units  5,000 Units Subcutaneous Q8H Olalere, Adewale A, MD   5,000 Units at 12/04/23 0510   LORazepam (ATIVAN) tablet 1-4 mg  1-4 mg Oral Q1H PRN Howerter, Justin B, DO       Or   LORazepam (ATIVAN) injection 1-4 mg  1-4 mg Intravenous Q1H PRN Howerter, Justin B, DO   1 mg at 12/04/23 0645   midodrine (PROAMATINE) tablet 15 mg  15 mg Oral TID WC Olalere, Adewale A, MD       multivitamin with minerals tablet 1  tablet  1 tablet Oral Daily Howerter, Justin B, DO   1 tablet at 12/03/23 0981   norepinephrine (LEVOPHED) 4mg  in (0.016 mg/mL) premix infusion  2-10 mcg/min Intravenous Titrated Olalere, Adewale A, MD 26.3 mL/hr at 12/04/23 0800 7 mcg/min at 12/04/23 0800   ondansetron (ZOFRAN) injection 4 mg  4 mg Intravenous Q6H PRN Howerter, Justin B, DO   4 mg at 12/02/23 0402   Oral care mouth rinse  15 mL Mouth Rinse PRN Olalere, Adewale A, MD       oxyCODONE (Oxy IR/ROXICODONE) immediate release tablet 5 mg  5 mg Oral Q6H PRN Migdalia Dk, MD   5 mg at 12/04/23 0500   polyethylene glycol (MIRALAX / GLYCOLAX) packet 17 g  17 g Oral Daily PRN Olalere, Adewale A, MD       revefenacin (YUPELRI) nebulizer solution 175 mcg  175 mcg Nebulization Daily Olalere, Adewale A, MD   175 mcg at 12/04/23 0754   thiamine (VITAMIN B1) tablet 100 mg  100 mg Oral Daily Howerter, Justin B, DO   100 mg at 12/03/23 1914   Or   thiamine (VITAMIN B1) injection 100 mg  100 mg Intravenous Daily Howerter, Justin B, DO       urea (URE-NA) oral packet 15 g  15 g Oral BID Darnell Level, MD          Review of Systems: Unable to obtain due to the patient's altered mental status Physical Exam: Vitals:   12/04/23 0756 12/04/23 0800  BP:  122/60  Pulse:  67  Resp:  14  Temp:  98 F (36.7 C)  SpO2: 95% 100%   Total I/O In: 23.8 [I.V.:23.8] Out: -   Intake/Output Summary (Last 24 hours) at 12/04/2023 0915 Last data filed at 12/04/2023 0800 Gross per 24 hour  Intake 1058.62 ml  Output 2925 ml  Net -1866.38 ml   Constitutional: tired-appearing, no acute distress ENMT: ears and nose without scars or lesions, MMM CV: normal rate, 1+ edema in ble overall improved but in dependent areas Respiratory: bilateral chest rise, normal work of breathing Gastrointestinal: soft, non-tender, no palpable masses or hernias Skin: no visible lesions or rashes Psych: Altered, minimally interactive,   Test Results I  personally reviewed new and old clinical labs and radiology tests Lab Results  Component Value Date   NA 122 (L) 12/03/2023   K 3.5 12/03/2023   CL 81 (L) 12/03/2023   CO2 28 12/03/2023   BUN 78 (H) 12/03/2023   CREATININE 1.37 (H) 12/03/2023   GLU 72 10/31/2023   CALCIUM 8.5 (L) 12/03/2023   ALBUMIN 3.3 (L) 12/02/2023    CBC Recent Labs  Lab 12/02/23 0219 12/02/23 1552  WBC 10.6* 10.3  HGB 15.4 13.2  HCT 42.5 36.9*  MCV 104.2* 105.7*  PLT 348 316

## 2023-12-04 NOTE — Progress Notes (Signed)
 NAME:  Kyle Montoya, MRN:  [id number], DOB:  11-Apr-1951, LOS: 2 ADMISSION DATE:  12/02/2023, CONSULTATION DATE: 12/02/2023 REFERRING MD: Dr. Robb Matar, CHIEF COMPLAINT: Hypotension  History of Present Illness:  Patient with a history of chronic diastolic congestive heart failure, paroxysmal atrial fibrillation, alcohol abuse, history of anemia, osteoarthritis, ascending aortic aneurysm, hypertension, history of cholangiocarcinoma, CML, history of lung cancer, COPD   Came in with general fatigue and weakness   York Spaniel he was recently treated for pneumonia Persistent lower extremity edema   He has been on diuretics   Denies any fevers, chills, not really coughing up any secretions Has no diarrhea, no abdominal pain or discomfort  Pertinent  Medical History   Past Medical History:  Diagnosis Date   Abnormal colonoscopy 02/23/2021   Abnormal transaminases 02/27/2022   Acute diastolic (congestive) heart failure (HCC) 10/08/2023   Adenomatous polyp of descending colon 02/23/2021   Adverse drug reaction, initial encounter 07/04/2016   07/02/2016: Prevnar 23 vaccine given in right arm  07/02/2016: localized swelling  10/11/20167: Increase in swelling from shoulder to inner aspect of elbow.Red, warm and firm to touch.  No lip or airway swelling or stridor.  2+ brachial and radial pulses right arm.  Capillary refill < 2 seconds     Anemia in chronic illness 01/22/2013   Arthritis, degenerative 01/31/2015   Ascending aortic aneurysm (HCC) 06/27/2022   Benign fibroma of prostate 01/31/2015   BP (high blood pressure) 01/22/2013   BPH (benign prostatic hyperplasia)    CAD (coronary artery disease)    Cannot sleep 01/31/2015   Carcinoma of biliary tract (HCC) 02/15/2013   Diagnosed in 2014, Pt had whipple done at [facility], under care by Dr. Deveron Furlong Oncologist, has f/u every 6-12 months.   Cataract    bilateral sx   Cervical osteoarthritis 10/18/2014   Cholangiocarcinoma of biliary tract  (HCC)    Chronic cough 01/31/2015   Chronic myelogenous leukemia (HCC)    Chronic pain 01/31/2015   CML (chronic myelocytic leukemia) (HCC) 10/18/2014   Overview:   Follows with [facility], Dr. Gilman Buttner. On dasatinib, last WBC 9.6.     Compulsive tobacco user syndrome 01/31/2015   Congestive heart failure (CHF) (HCC) 09/26/2023   COPD (chronic obstructive pulmonary disease) (HCC)    uses inhaler   COPD with asthma (HCC) 01/22/2013   Severe obstruction on spirometry FeV1 54% FeV1/FVC 51% 01/31/2015  CXR NAD 01/25/15     Diuretic-induced hypokalemia 05/23/2023   DJD (degenerative joint disease)    Dyslipidemia    Family history of colonic polyps    Family history of prostate cancer    Full thickness rotator cuff tear 08/08/2020   Gout    High coronary artery calcium score 05/30/2023   History of colon polyps 02/23/2021   History of colonic polyps 10/01/2021   IMO SNOMED Dx Update Oct 2024     HLD (hyperlipidemia) 01/31/2015   HTN (hypertension)    on meds   Hypercalcemia 08/10/2020   Hypercholesterolemia    Hypocalcemia 08/27/2021   Hyponatremia 09/19/2023   Impingement syndrome of both shoulders 03/31/2020   Lung cancer (HCC)    Myelogenous leukemia (HCC)    Non-small cell carcinoma of lung (HCC) 01/31/2015   02/13/2015 CT Chest no evidence of recurrence in lung cancer  Stage I adenocarcinoma diagnosed with left upper lobectomy in February 2016     OA (osteoarthritis)    Orthostatic hypotension 10/10/2023   PAD (peripheral artery disease) (HCC)  Personal history of gastric ulcers 02/23/2021   Renal insufficiency    Shoulder pain, left 03/29/2020   Shoulder pain, right 03/29/2020   Tendonitis    patellar   Thoracic aortic aneurysm (HCC) 06/27/2022   Wheezing 01/31/2015   Significant Hospital Events: Including procedures, antibiotic start and stop dates in addition to other pertinent events   09/10/2023-echo with ejection fraction of 55 to 60% poorly visualized  right ventricle 3/11 admitted, requiring pressors 3/13 still requiring peripheral pressors  Interim History / Subjective:  Elderly, does not appear to be in distress Denies any pain or discomfort Uneventful night  Objective   Blood pressure 122/60, pulse 67, temperature 98 F (36.7 C), temperature source Axillary, resp. rate 14, height 5\' 11"  (1.803 m), weight 87.9 kg, SpO2 100%.        Intake/Output Summary (Last 24 hours) at 12/04/2023 0845 Last data filed at 12/04/2023 0800 Gross per 24 hour  Intake 1058.62 ml  Output 2925 ml  Net -1866.38 ml   Filed Weights   12/03/23 0219 12/04/23 0100 12/04/23 0548  Weight: 92.4 kg 87.1 kg 87.9 kg   Examination: General: Elderly, does not appear to be in distress, chronically ill-appearing HENT: Dry oral mucosa Lungs: Clear breath sounds, decreased air movement at the bases Cardiovascular: S1-S2 appreciated Abdomen: Bowel is appreciated Extremities: No clubbing, peripheral edema has improved Neuro: Awake, alert and oriented x 3 GU:   Overall about 5 L negative since admission Renal parameters have remained stable BNP elevated Chronic right pleural effusion on chest x-ray-reviewed by myself   Resolved Hospital Problem list     Assessment & Plan:   Decompensated heart failure -Continuing to optimize fluid status -On diuretics  Hypotension -On midodrine -Increase dose of midodrine to 15 3 times daily -Continue to wean off peripheral pressors as tolerated  Acute kidney injury -Receiving Lasix boluses -Appreciate renal services continue to follow  Concerned that Sprycel may be contributing to fluid retention  Hyponatremia -This is resolving -Likely multifactorial  History of obstructive pulmonary disease/asthma -Continue bronchodilators including Roxy Manns -Was on Trelegy at home  Coronary artery disease -Continue aspirin  History of CML -To follow oncology as outpatient  History of alcohol  abuse -Continue to monitor -CIWA monitoring with ativan  Ascending aortic aneurysm history -Vascular as outpatient  Best Practice (right click and "Reselect all SmartList Selections" daily)   Diet/type: Regular consistency (see orders) DVT prophylaxis prophylactic heparin  Pressure ulcer(s): N/A GI prophylaxis: N/A Lines: N/A Foley:  Yes, and it is still needed Code Status:  full code Last date of multidisciplinary goals of care discussion [Per primary]  Labs   CBC: Recent Labs  Lab 12/02/23 0219 12/02/23 1552  WBC 10.6* 10.3  HGB 15.4 13.2  HCT 42.5 36.9*  MCV 104.2* 105.7*  PLT 348 316    Basic Metabolic Panel: Recent Labs  Lab 12/02/23 1159 12/02/23 1552 12/03/23 0012 12/03/23 0311 12/03/23 0943 12/03/23 1356 12/03/23 2008  NA 119*   < > 122* 122* 124* 123* 122*  K 3.8   < > 3.5 3.5 3.1* 3.5 3.5  CL 80*   < > 81* 82* 82* 80* 81*  CO2 21*   < > 22 22 23 30 28   GLUCOSE 119*   < > 164* 170* 184* 203* 179*  BUN 94*   < > 97* 90* 86* 78* 78*  CREATININE 2.73*   < > 2.22* 1.87* 1.58* 1.49* 1.37*  CALCIUM 7.5*   < > 7.9* 7.9* 8.2* 8.6*  8.5*  MG 2.8*  --   --   --   --   --   --    < > = values in this interval not displayed.   GFR: Estimated Creatinine Clearance: 50.4 mL/min (A) (by C-G formula based on SCr of 1.37 mg/dL (H)). Recent Labs  Lab 12/02/23 0219 12/02/23 1552  WBC 10.6* 10.3    Liver Function Tests: Recent Labs  Lab 12/02/23 1159  AST 70*  ALT 36  ALKPHOS 71  BILITOT 1.0  PROT 6.9  ALBUMIN 3.3*   No results for input(s): "LIPASE", "AMYLASE" in the last 168 hours. No results for input(s): "AMMONIA" in the last 168 hours.  ABG No results found for: "PHART", "PCO2ART", "PO2ART", "HCO3", "TCO2", "ACIDBASEDEF", "O2SAT"   Coagulation Profile: No results for input(s): "INR", "PROTIME" in the last 168 hours.  Cardiac Enzymes: Recent Labs  Lab 12/02/23 1159  CKTOTAL 143    HbA1C: Hemoglobin A1C  Date/Time Value Ref Range Status   12/13/2022 12:00 AM 4.5  Final   Hgb A1c MFr Bld  Date/Time Value Ref Range Status  09/25/2020 09:17 AM 4.7 (L) 4.8 - 5.6 % Final    Comment:    (NOTE) Pre diabetes:          5.7%-6.4%  Diabetes:              >6.4%  Glycemic control for   <7.0% adults with diabetes     CBG: Recent Labs  Lab 12/04/23 0843  GLUCAP 156*    Review of Systems:   Arousable, denies any significant complaints  Past Medical History:  He,  has a past medical history of Abnormal colonoscopy (02/23/2021), Abnormal transaminases (02/27/2022), Acute diastolic (congestive) heart failure (HCC) (10/08/2023), Adenomatous polyp of descending colon (02/23/2021), Adverse drug reaction, initial encounter (07/04/2016), Anemia in chronic illness (01/22/2013), Arthritis, degenerative (01/31/2015), Ascending aortic aneurysm (HCC) (06/27/2022), Benign fibroma of prostate (01/31/2015), BP (high blood pressure) (01/22/2013), BPH (benign prostatic hyperplasia), CAD (coronary artery disease), Cannot sleep (01/31/2015), Carcinoma of biliary tract (HCC) (02/15/2013), Cataract, Cervical osteoarthritis (10/18/2014), Cholangiocarcinoma of biliary tract (HCC), Chronic cough (01/31/2015), Chronic myelogenous leukemia (HCC), Chronic pain (01/31/2015), CML (chronic myelocytic leukemia) (HCC) (10/18/2014), Compulsive tobacco user syndrome (01/31/2015), Congestive heart failure (CHF) (HCC) (09/26/2023), COPD (chronic obstructive pulmonary disease) (HCC), COPD with asthma (HCC) (01/22/2013), Diuretic-induced hypokalemia (05/23/2023), DJD (degenerative joint disease), Dyslipidemia, Family history of colonic polyps, Family history of prostate cancer, Full thickness rotator cuff tear (08/08/2020), Gout, High coronary artery calcium score (05/30/2023), History of colon polyps (02/23/2021), History of colonic polyps (10/01/2021), HLD (hyperlipidemia) (01/31/2015), HTN (hypertension), Hypercalcemia (08/10/2020), Hypercholesterolemia, Hypocalcemia  (08/27/2021), Hyponatremia (09/19/2023), Impingement syndrome of both shoulders (03/31/2020), Lung cancer (HCC), Myelogenous leukemia (HCC), Non-small cell carcinoma of lung (HCC) (01/31/2015), OA (osteoarthritis), Orthostatic hypotension (10/10/2023), PAD (peripheral artery disease) (HCC), Personal history of gastric ulcers (02/23/2021), Renal insufficiency, Shoulder pain, left (03/29/2020), Shoulder pain, right (03/29/2020), Tendonitis, Thoracic aortic aneurysm (HCC) (06/27/2022), and Wheezing (01/31/2015).   Surgical History:   Past Surgical History:  Procedure Laterality Date   BIOPSY  04/30/2021   Procedure: BIOPSY;  Surgeon: Meridee Score Netty Starring., MD;  Location: [facility];  Service: Gastroenterology;;   Bowel duct surg  2014   Bowel cancer   CARDIAC CATHETERIZATION  2000   CHOLECYSTECTOMY     COLONOSCOPY  06/23/2015   mild sigmoid diverticulosis. small external and internal hemorrhoids. otherwise normal colonoscopy to terminal ileum   COLONOSCOPY WITH PROPOFOL N/A 04/30/2021   Procedure: COLONOSCOPY WITH PROPOFOL;  Surgeon: Meridee Score,  Netty Starring., MD;  Location: [facility];  Service: Gastroenterology;  Laterality: N/A;   ENDOSCOPIC MUCOSAL RESECTION N/A 04/30/2021   Procedure: ENDOSCOPIC MUCOSAL RESECTION;  Surgeon: Meridee Score Netty Starring., MD;  Location: [facility];  Service: Gastroenterology;  Laterality: N/A;   ESOPHAGOGASTRODUODENOSCOPY (EGD) WITH PROPOFOL N/A 04/30/2021   Procedure: ESOPHAGOGASTRODUODENOSCOPY (EGD) WITH PROPOFOL;  Surgeon: Meridee Score Netty Starring., MD;  Location: [facility];  Service: Gastroenterology;  Laterality: N/A;   HEMOSTASIS CLIP PLACEMENT  04/30/2021   Procedure: HEMOSTASIS CLIP PLACEMENT;  Surgeon: Lemar Lofty., MD;  Location: [facility];  Service: Gastroenterology;;   LUNG CANCER SURGERY  09/2014   partial lobectomy   POLYPECTOMY  04/30/2021   Procedure: POLYPECTOMY;  Surgeon: Mansouraty, Netty Starring., MD;  Location: [facility];   Service: Gastroenterology;;   SHOULDER OPEN ROTATOR CUFF REPAIR Right 09/27/2020   Procedure: Right shoulder mini open rotator cuff repair, distal clavicle resection;  Surgeon: Jene Every, MD;  Location: [facility];  Service: Orthopedics;  Laterality: Right;  90 mins  Choice with Block anesthesia   SUBMUCOSAL LIFTING INJECTION  04/30/2021   Procedure: SUBMUCOSAL LIFTING INJECTION;  Surgeon: Lemar Lofty., MD;  Location: [facility];  Service: Gastroenterology;;   UPPER GASTROINTESTINAL ENDOSCOPY     WHIPPLE PROCEDURE  02/25/2013   Procedure: WHIPPLE PROCEDURE; Surgeon: Deveron Furlong, MD; Location: [facility] MA[facility] OR; Service: General; Laterality: N/A;     Social History:   reports that he has quit smoking. His smoking use included cigarettes. He has a 10 pack-year smoking history. He has never used smokeless tobacco. He reports current alcohol use of about 21.0 standard drinks of alcohol per week. He reports that he does not use drugs.   Family History:  His family history includes Asthma in his sister; Cancer in his half-sister and paternal grandfather; Hypertension in his brother, mother, and sister; Prostate cancer (age of onset: 96) in his father. There is no history of Esophageal cancer, Colon polyps, Colon cancer, Stomach cancer, Rectal cancer, Inflammatory bowel disease, Liver disease, or Pancreatic cancer.   Allergies Allergies  Allergen Reactions   Pneumococcal Vaccines Swelling   The patient is critically ill with multiple organ systems failure and requires high complexity decision making for assessment and support, frequent evaluation and titration of therapies, application of advanced monitoring technologies and extensive interpretation of multiple databases. Critical Care Time devoted to patient care services described in this note independent of APP/resident time (if applicable)  is 31 minutes.   Virl Diamond MD [facility] Personal pager: See  Amion If unanswered, please page CCM On-call: [phone]

## 2023-12-05 DIAGNOSIS — E871 Hypo-osmolality and hyponatremia: Secondary | ICD-10-CM | POA: Diagnosis not present

## 2023-12-05 LAB — RENAL FUNCTION PANEL
Albumin: 3.5 g/dL (ref 3.5–5.0)
Albumin: 3.5 g/dL (ref 3.5–5.0)
Anion gap: 15 (ref 5–15)
Anion gap: 12 (ref 5–15)
BUN: 80 mg/dL — ABNORMAL HIGH (ref 8–23)
BUN: 80 mg/dL — ABNORMAL HIGH (ref 8–23)
CO2: 26 mmol/L (ref 22–32)
CO2: 29 mmol/L (ref 22–32)
Calcium: 9.2 mg/dL (ref 8.9–10.3)
Calcium: 9.6 mg/dL (ref 8.9–10.3)
Chloride: 87 mmol/L — ABNORMAL LOW (ref 98–111)
Chloride: 85 mmol/L — ABNORMAL LOW (ref 98–111)
Creatinine, Ser: 1.17 mg/dL (ref 0.61–1.24)
Creatinine, Ser: 1.2 mg/dL (ref 0.61–1.24)
GFR, Estimated: >60 mL/min (ref >60)
GFR, Estimated: >60 mL/min (ref >60)
Glucose, Bld: 123 mg/dL — ABNORMAL HIGH (ref 70–99)
Glucose, Bld: 159 mg/dL — ABNORMAL HIGH (ref 70–99)
Phosphorus: 3.2 mg/dL (ref 2.5–4.6)
Phosphorus: 3.4 mg/dL (ref 2.5–4.6)
Potassium: 3.4 mmol/L — ABNORMAL LOW (ref 3.5–5.1)
Potassium: 3.3 mmol/L — ABNORMAL LOW (ref 3.5–5.1)
Sodium: 128 mmol/L — ABNORMAL LOW (ref 135–145)
Sodium: 126 mmol/L — ABNORMAL LOW (ref 135–145)

## 2023-12-05 LAB — CBC
HCT: 36.8 % — ABNORMAL LOW (ref 39.0–52.0)
Hemoglobin: 12.7 g/dL — ABNORMAL LOW (ref 13.0–17.0)
MCH: 37.1 pg — ABNORMAL HIGH (ref 26.0–34.0)
MCHC: 34.5 g/dL (ref 30.0–36.0)
MCV: 107.6 fL — ABNORMAL HIGH (ref 80.0–100.0)
Platelets: 230 10*3/uL (ref 150–400)
RBC: 3.42 MIL/uL — ABNORMAL LOW (ref 4.22–5.81)
RDW: 13.3 % (ref 11.5–15.5)
WBC: 3.9 10*3/uL — ABNORMAL LOW (ref 4.0–10.5)
nRBC: 0 % (ref 0.0–0.2)

## 2023-12-05 LAB — PHOSPHORUS: Phosphorus: 3.2 mg/dL (ref 2.5–4.6)

## 2023-12-05 LAB — MAGNESIUM: Magnesium: 2.1 mg/dL (ref 1.7–2.4)

## 2023-12-05 MED ORDER — LORAZEPAM 2 MG/ML IJ SOLN
1.0000 mg | INTRAMUSCULAR | Status: DC | PRN
  Administered 2023-12-05: 2 mg via INTRAVENOUS
  Administered 2023-12-06: 4 mg via INTRAVENOUS
  Administered 2023-12-06: 2 mg via INTRAVENOUS
  Filled 2023-12-05 (×2): qty 2
  Filled 2023-12-05 (×2): qty 1

## 2023-12-05 MED ORDER — LORAZEPAM 1 MG PO TABS
1.0000 mg | ORAL_TABLET | ORAL | Status: DC | PRN
  Administered 2023-12-05 – 2023-12-06 (×3): 2 mg via ORAL
  Filled 2023-12-05 (×3): qty 2

## 2023-12-05 MED ORDER — POTASSIUM CHLORIDE CRYS ER 20 MEQ PO TBCR
40.0000 meq | EXTENDED_RELEASE_TABLET | Freq: Once | ORAL | Status: AC
  Administered 2023-12-05: 40 meq via ORAL
  Filled 2023-12-05: qty 2

## 2023-12-05 MED ORDER — POTASSIUM CHLORIDE CRYS ER 20 MEQ PO TBCR: 40.0000 meq | EXTENDED_RELEASE_TABLET | Freq: Once | ORAL | Status: DC

## 2023-12-05 MED ORDER — POTASSIUM CHLORIDE CRYS ER 20 MEQ PO TBCR
20.0000 meq | EXTENDED_RELEASE_TABLET | ORAL | Status: AC
  Administered 2023-12-05 (×2): 20 meq via ORAL
  Filled 2023-12-05: qty 1

## 2023-12-05 NOTE — Progress Notes (Signed)
 PROGRESS NOTE    Kyle Montoya  BJY:[id number]  DOB: 1951-08-10  DOA: 12/02/2023 PCP: Lars Mage, NP Outpatient Specialists:   Hospital course:  73 year old with PAF, HFrEF with EF 55 to 60%, H/show alcohol abuse, COPD and history of lung CA, cholangiocarcinoma and CML was admitted with progressive weakness, sudden onset dyspnea and increased lower extremity edema.  Workup was noted for AKI, hyponatremia and metabolic acidosis as well as hypotension.  Patient has been treated with fluid resuscitation and with assistance of nephrology and PCCM consultation given hypovolemic hypotension requiring pressors, now discontinued and has done well with improvement in kidney function and sodium.  Subjective:  Patient states he feels weak and tired.  Notes that he lives at home alone.  Is eager to go back home but notes he has not been out of bed since he was admitted.   Objective: Vitals:   12/05/23 0438 12/05/23 0439 12/05/23 1001 12/05/23 1300  BP: 111/61   (!) 128/92  Pulse: 86   72  Resp: 16   18  Temp: 98.2 F (36.8 C)   97.7 F (36.5 C)  TempSrc: Oral   Oral  SpO2: 98%  97% 99%  Weight:  86.9 kg    Height:        Intake/Output Summary (Last 24 hours) at 12/05/2023 1548 Last data filed at 12/05/2023 1445 Gross per 24 hour  Intake 1158.07 ml  Output 1825 ml  Net -666.93 ml   Filed Weights   12/04/23 0100 12/04/23 0548 12/05/23 0439  Weight: 87.1 kg 87.9 kg 86.9 kg     Exam:  General: Tired appearing man lying at 30 degrees without respiratory dysfunction Eyes: sclera anicteric, conjuctiva mild injection bilaterally CVS: S1-S2, regular  Respiratory:  decreased air entry bilaterally secondary to decreased inspiratory effort, rales at bases  GI: NABS, soft, NT  LE: Warm and well-perfused Neuro: A/O x 3,  grossly nonfocal.  Psych: patient is logical and coherent, judgement and insight appear normal, mood and affect appropriate to situation.  Data  Reviewed:  Basic Metabolic Panel: Recent Labs  Lab 12/02/23 1159 12/02/23 1552 12/03/23 2008 12/04/23 0810 12/04/23 1436 12/04/23 2045 12/05/23 0512  NA 119*   < > 122* 124* 126* 126* 128*  K 3.8   < > 3.5 3.8 3.5 3.9 3.4*  CL 80*   < > 81* 85* 84* 85* 87*  CO2 21*   < > 28 28 32 29 26  GLUCOSE 119*   < > 179* 164* 152* 141* 123*  BUN 94*   < > 78* 74* 75* 75* 80*  CREATININE 2.73*   < > 1.37* 1.22 1.26* 1.24 1.17  CALCIUM 7.5*   < > 8.5* 8.5* 8.9 9.0 9.2  MG 2.8*  --   --   --   --   --  2.1  PHOS  --   --   --   --   --   --  3.2  3.2   < > = values in this interval not displayed.    CBC: Recent Labs  Lab 12/02/23 0219 12/02/23 1552 12/05/23 0512  WBC 10.6* 10.3 3.9*  HGB 15.4 13.2 12.7*  HCT 42.5 36.9* 36.8*  MCV 104.2* 105.7* 107.6*  PLT 348 316 230     Scheduled Meds:  arformoterol  15 mcg Nebulization BID   Chlorhexidine Gluconate Cloth  6 each Topical Daily   folic acid  1 mg Oral Daily   heparin  5,000 Units  Subcutaneous Q8H   midodrine  15 mg Oral TID WC   multivitamin with minerals  1 tablet Oral Daily   revefenacin  175 mcg Nebulization Daily   thiamine  100 mg Oral Daily   Or   thiamine  100 mg Intravenous Daily   urea  15 g Oral BID   Continuous Infusions:   Assessment & Plan:   AKI secondary to ATN Hyponatremia Hypovolemic shock secondary to overdiuresis--now off pressors Of note home medication showed that patient was on furosemide 80 twice daily as well as torsemide 60 daily In addition he was on a irbesartan, HCTZ which have both been discontinued Patient is now improved with fluid resuscitation Creatinine is normalized Sodium is slowly normalizing Continue midodrine 15 3 times daily Patient should never be placed back on HCTZ  Hypokalemia Will replete and recheck  Decompensated HFrEF Echocardiogram done last week shows EF 65 to 70% with grade 1 diastolic dysfunction Presently euvolemic Nephrology recommends potential  discharge with loop diuretic However patient has a relatively normal heart and is presently euvolemic If loop diuretic is to be started, will need to be low-dose with close follow-up  Alcoholism No evidence of complicated withdrawal  Debilitation Patient lives at home alone, seems very weak at present PT consult placed for safe disposition evaluation     DVT prophylaxis: SCD Code Status: Full Family Communication: None today     Studies: No results found.  Principal Problem:   Hyponatremia Active Problems:   COPD with asthma (HCC)   Gout   HLD (hyperlipidemia)   Ascending aortic aneurysm (HCC)   BPH (benign prostatic hyperplasia)   CAD (coronary artery disease)   Chronic myelogenous leukemia (HCC)   HTN (hypertension)   AKI (acute kidney injury) (HCC)   Hypotension   Alcohol abuse   Hypokalemia   Metabolic acidosis   Shock (HCC)     Kyle Montoya, [facility]  If 7PM-7AM, please contact night-coverage [email or web address]   LOS: 3 days

## 2023-12-05 NOTE — Progress Notes (Addendum)
 Pharmacy Electrolyte Replacement  Recent Labs:  Recent Labs    12/05/23 0512  K 3.4*  MG 2.1  PHOS 3.2  3.2  CREATININE 1.17    Low Critical Values (K </= 2.5, Phos </= 1, Mg </= 1) Present: None  MD Contacted: N/A  Plan: Replenish KCl - ordered 20 mEq q2h x 2 Check BMET in AM  Thank you for allowing pharmacy to be a part of this patient's care.   Lyn Henri, [facility] PharmD Candidate 12/05/2023 10:24 AM

## 2023-12-05 NOTE — Progress Notes (Signed)
 Nephrology Follow-Up Consult note   Assessment/Recommendations: Kyle Montoya is a/an 73 y.o. male with a past medical history significant for Alcohol use disorder, CHF, atrial fibrillation, BPH, CAD, HTN CML, lung cancer, COPD, H LD, who presents with Heart failure exacerbation, acute severe hyponatremia, AKI, shock   AKI: Crt normal at baseline. Llikely ATN and possibly cardiorenal. Now resolved -improving quickly with blood pressure support -Continue to monitor daily Cr, Dose meds for GFR -Monitor Daily I/Os, Daily weight  -Maintain MAP>65 for optimal renal perfusion.  -Avoid nephrotoxic medications including NSAIDs -Use synthetic opioids (Fentanyl/Dilaudid) if needed -RUS without obstruction   Acute heart failure exacerbation:With hyponatremia as below.  Volume excess contributing to hyponatremia.  Volume now optimized. Consider oral daily loop diuretic at discharge   Acute asymptomatic hyponatremia: Contributions from thiazide, volume excess, alcohol intake.  Has improved with volume optimization. Urine studies continue to be consistent with causes as above. No significant siadh but have used a little urea. Continue urea through today and likely stop tomorrow   Hypokalemia: Continue to aggressively correct as hypokalemia can make osmotic demyelination more of a concern particularly in alcoholics.   Shock: improved off pressors now on midodrine. Per primary   Alcohol use disorder: Management per primary team   Hypertension: Hold home blood pressure medications.  Will not be able to take hydrochlorothiazide again  If Sodium improved tomorrow we may sign off and not see patient over the weekend but are available if needed   Recommendations conveyed to primary service.    Darnell Level [facility] 12/05/2023 12:49 PM  ___________________________________________________________  CC: shortness of breath  Interval History/Subjective: Feels well with no  complaints. Wants to go home.   Medications:  Current Facility-Administered Medications  Medication Dose Route Frequency Provider Last Rate Last Admin   acetaminophen (TYLENOL) tablet 650 mg  650 mg Oral Q6H PRN Howerter, Justin B, DO   650 mg at 12/04/23 0446   Or   acetaminophen (TYLENOL) suppository 650 mg  650 mg Rectal Q6H PRN Howerter, Justin B, DO       albuterol (PROVENTIL) (2.5 MG/3ML) 0.083% nebulizer solution 2.5 mg  2.5 mg Nebulization Q4H PRN Bobette Mo, MD   2.5 mg at 12/03/23 2339   arformoterol (BROVANA) nebulizer solution 15 mcg  15 mcg Nebulization BID Olalere, Adewale A, MD   15 mcg at 12/05/23 1000   Chlorhexidine Gluconate Cloth 2 % PADS 6 each  6 each Topical Daily Olalere, Adewale A, MD   6 each at 12/03/23 2200   docusate sodium (COLACE) capsule 100 mg  100 mg Oral BID PRN Olalere, Adewale A, MD       folic acid (FOLVITE) tablet 1 mg  1 mg Oral Daily Howerter, Justin B, DO   1 mg at 12/05/23 1016   heparin injection 5,000 Units  5,000 Units Subcutaneous Q8H Olalere, Adewale A, MD   5,000 Units at 12/05/23 0642   LORazepam (ATIVAN) tablet 1-4 mg  1-4 mg Oral Q1H PRN Leandro Reasoner Tublu, MD   2 mg at 12/05/23 1016   Or   LORazepam (ATIVAN) injection 1-4 mg  1-4 mg Intravenous Q1H PRN Leandro Reasoner Tublu, MD       midodrine (PROAMATINE) tablet 15 mg  15 mg Oral TID WC Olalere, Adewale A, MD   15 mg at 12/05/23 1016   multivitamin with minerals tablet 1 tablet  1 tablet Oral Daily Howerter, Justin B, DO   1 tablet at 12/05/23 1016  ondansetron (ZOFRAN) injection 4 mg  4 mg Intravenous Q6H PRN Howerter, Justin B, DO   4 mg at 12/02/23 0402   Oral care mouth rinse  15 mL Mouth Rinse PRN Olalere, Adewale A, MD       oxyCODONE (Oxy IR/ROXICODONE) immediate release tablet 5 mg  5 mg Oral Q6H PRN Migdalia Dk, MD   5 mg at 12/04/23 0500   polyethylene glycol (MIRALAX / GLYCOLAX) packet 17 g  17 g Oral Daily PRN Olalere, Adewale A, MD       potassium  chloride SA (KLOR-CON M) CR tablet 20 mEq  20 mEq Oral Q2H Lynden Ang, RPH       revefenacin (YUPELRI) nebulizer solution 175 mcg  175 mcg Nebulization Daily Olalere, Adewale A, MD   175 mcg at 12/05/23 1001   thiamine (VITAMIN B1) tablet 100 mg  100 mg Oral Daily Howerter, Justin B, DO   100 mg at 12/05/23 1016   Or   thiamine (VITAMIN B1) injection 100 mg  100 mg Intravenous Daily Howerter, Justin B, DO       urea (URE-NA) oral packet 15 g  15 g Oral BID Darnell Level, MD   15 g at 12/05/23 1017      Review of Systems: Unable to obtain due to the patient's altered mental status Physical Exam: Vitals:   12/05/23 0438 12/05/23 1001  BP: 111/61   Pulse: 86   Resp: 16   Temp: 98.2 F (36.8 C)   SpO2: 98% 97%   Total I/O In: 120 [P.O.:120] Out: -   Intake/Output Summary (Last 24 hours) at 12/05/2023 1249 Last data filed at 12/05/2023 0900 Gross per 24 hour  Intake 932.22 ml  Output 1425 ml  Net -492.78 ml   Constitutional: well appearing, nad ENMT: ears and nose without scars or lesions, MMM CV: normal rate, no edema Respiratory: bilateral chest rise, normal work of breathing Gastrointestinal: soft, non-tender, no palpable masses or hernias Skin: no visible lesions or rashes Psych: awake and alert   Test Results I personally reviewed new and old clinical labs and radiology tests Lab Results  Component Value Date   NA 128 (L) 12/05/2023   K 3.4 (L) 12/05/2023   CL 87 (L) 12/05/2023   CO2 26 12/05/2023   BUN 80 (H) 12/05/2023   CREATININE 1.17 12/05/2023   GLU 72 10/31/2023   CALCIUM 9.2 12/05/2023   ALBUMIN 3.5 12/05/2023   PHOS 3.2 12/05/2023   PHOS 3.2 12/05/2023    CBC Recent Labs  Lab 12/02/23 0219 12/02/23 1552 12/05/23 0512  WBC 10.6* 10.3 3.9*  HGB 15.4 13.2 12.7*  HCT 42.5 36.9* 36.8*  MCV 104.2* 105.7* 107.6*  PLT 348 316 230

## 2023-12-06 DIAGNOSIS — E871 Hypo-osmolality and hyponatremia: Secondary | ICD-10-CM | POA: Diagnosis not present

## 2023-12-06 LAB — RENAL FUNCTION PANEL
Albumin: 3.5 g/dL (ref 3.5–5.0)
Anion gap: 10 (ref 5–15)
BUN: 67 mg/dL — ABNORMAL HIGH (ref 8–23)
CO2: 30 mmol/L (ref 22–32)
Calcium: 9.8 mg/dL (ref 8.9–10.3)
Chloride: 91 mmol/L — ABNORMAL LOW (ref 98–111)
Creatinine, Ser: 1.02 mg/dL (ref 0.61–1.24)
GFR, Estimated: >60 mL/min (ref >60)
Glucose, Bld: 97 mg/dL (ref 70–99)
Phosphorus: 3.2 mg/dL (ref 2.5–4.6)
Potassium: 4 mmol/L (ref 3.5–5.1)
Sodium: 131 mmol/L — ABNORMAL LOW (ref 135–145)

## 2023-12-06 LAB — CBC
HCT: 37.9 % — ABNORMAL LOW (ref 39.0–52.0)
Hemoglobin: 13.1 g/dL (ref 13.0–17.0)
MCH: 37.8 pg — ABNORMAL HIGH (ref 26.0–34.0)
MCHC: 34.6 g/dL (ref 30.0–36.0)
MCV: 109.2 fL — ABNORMAL HIGH (ref 80.0–100.0)
Platelets: 223 10*3/uL (ref 150–400)
RBC: 3.47 MIL/uL — ABNORMAL LOW (ref 4.22–5.81)
RDW: 13.7 % (ref 11.5–15.5)
WBC: 5.4 10*3/uL (ref 4.0–10.5)
nRBC: 0 % (ref 0.0–0.2)

## 2023-12-06 MED ORDER — LORAZEPAM 2 MG/ML IJ SOLN: 0.5000 mg | Freq: Once | INTRAMUSCULAR | Status: DC

## 2023-12-06 MED ORDER — NICOTINE 14 MG/24HR TD PT24
14.0000 mg | MEDICATED_PATCH | Freq: Every day | TRANSDERMAL | Status: DC
Start: 2023-12-06 — End: 2023-12-06

## 2023-12-06 MED ORDER — NICOTINE 14 MG/24HR TD PT24: 14.0000 mg | MEDICATED_PATCH | Freq: Every day | TRANSDERMAL | Status: DC

## 2023-12-06 MED ORDER — TORSEMIDE 20 MG PO TABS
20.0000 mg | ORAL_TABLET | Freq: Every day | ORAL | Status: DC
  Administered 2023-12-06 – 2023-12-08 (×3): 20 mg via ORAL
  Filled 2023-12-06 (×3): qty 1

## 2023-12-06 NOTE — Progress Notes (Addendum)
 PT Cancellation Note  Patient Details Name: Kyle Montoya MRN: [id number] DOB: December 12, 1950   Cancelled Treatment:    Reason Eval/Treat Not Completed:  Attempted PT eval-RN did not wish for PT to attempt eval on today-reported having to give pt ativan. Will check back as schedule allows.    Faye Ramsay, PT Acute Rehabilitation  Office: [phone]

## 2023-12-06 NOTE — Progress Notes (Signed)
 PROGRESS NOTE    Kyle Montoya  BJY:[id number]  DOB: 1951-06-01  DOA: 12/02/2023 PCP: Lars Mage, NP Outpatient Specialists:   Hospital course:  73 year old with PAF, HFrEF with EF 55 to 60%, H/o alcohol abuse, COPD and history of lung CA, cholangiocarcinoma and CML was admitted with progressive weakness, sudden onset dyspnea and increased lower extremity edema.  Workup was noted for AKI, hyponatremia and metabolic acidosis as well as hypotension.  Patient has been treated with fluid resuscitation and with assistance of nephrology and PCCM consultation given hypovolemic hypotension requiring pressors, now discontinued and has done well with improvement in kidney function and sodium.  Subjective:  Patient states he is sleepy.  Knows he is in the hospital.  Admitted to feeling a little shaky yesterday, but is not shaky now.  Objective: Vitals:   12/06/23 0705 12/06/23 0850 12/06/23 1017 12/06/23 1303  BP:  124/68  122/68  Pulse:  70  70  Resp:      Temp:    98 F (36.7 C)  TempSrc:    Oral  SpO2:   96% 98%  Weight: 86 kg     Height:        Intake/Output Summary (Last 24 hours) at 12/06/2023 1618 Last data filed at 12/06/2023 1500 Gross per 24 hour  Intake --  Output 1700 ml  Net -1700 ml   Filed Weights   12/04/23 0548 12/05/23 0439 12/06/23 0705  Weight: 87.9 kg 86.9 kg 86 kg     Exam:  General: Tired appearing man lying at 30 degrees without respiratory dysfunction Eyes: sclera anicteric, conjuctiva mild injection bilaterally CVS: S1-S2, regular  Respiratory:  decreased air entry bilaterally secondary to decreased inspiratory effort, rales at bases  GI: NABS, soft, NT  LE: Warm and well-perfused Neuro: A/O x 3,  grossly nonfocal.  Psych: patient is logical and coherent, judgement and insight appear normal, mood and affect appropriate to situation.  Data Reviewed:  Basic Metabolic Panel: Recent Labs  Lab 12/02/23 1159 12/02/23 1552 12/04/23 1436  12/04/23 2045 12/05/23 0512 12/05/23 1639 12/06/23 0555  NA 119*   < > 126* 126* 128* 126* 131*  K 3.8   < > 3.5 3.9 3.4* 3.3* 4.0  CL 80*   < > 84* 85* 87* 85* 91*  CO2 21*   < > 32 29 26 29 30   GLUCOSE 119*   < > 152* 141* 123* 159* 97  BUN 94*   < > 75* 75* 80* 80* 67*  CREATININE 2.73*   < > 1.26* 1.24 1.17 1.20 1.02  CALCIUM 7.5*   < > 8.9 9.0 9.2 9.6 9.8  MG 2.8*  --   --   --  2.1  --   --   PHOS  --   --   --   --  3.2  3.2 3.4 3.2   < > = values in this interval not displayed.    CBC: Recent Labs  Lab 12/02/23 0219 12/02/23 1552 12/05/23 0512 12/06/23 0555  WBC 10.6* 10.3 3.9* 5.4  HGB 15.4 13.2 12.7* 13.1  HCT 42.5 36.9* 36.8* 37.9*  MCV 104.2* 105.7* 107.6* 109.2*  PLT 348 316 230 223     Scheduled Meds:  arformoterol  15 mcg Nebulization BID   Chlorhexidine Gluconate Cloth  6 each Topical Daily   folic acid  1 mg Oral Daily   heparin  5,000 Units Subcutaneous Q8H   midodrine  15 mg Oral TID WC  multivitamin with minerals  1 tablet Oral Daily   revefenacin  175 mcg Nebulization Daily   thiamine  100 mg Oral Daily   Or   thiamine  100 mg Intravenous Daily   torsemide  20 mg Oral Daily   Continuous Infusions:   Assessment & Plan:   AKI secondary to ATN--resolved Hyponatremia Hypovolemic shock secondary to overdiuresis--now off pressors--resolved Of note home medication showed that patient was on furosemide 80 twice daily as well as torsemide 60 daily In addition he was on a irbesartan, HCTZ which have both been discontinued Patient is now improved with fluid resuscitation Creatinine is normalized Sodium is slowly normalizing Continue midodrine 15 3 times daily Patient should never be placed back on hydrochlorothiazide  Hypokalemia Normalized with repletion  Alcoholism Patient was noted to the shaky yesterday, CIWA protocol instituted Is only requiring some lorazepam to prevent DTs DTs  Debilitation Patient lives at home alone, seems  very weak at present PT consult placed for safe disposition evaluation  Decompensated HFrEF Echocardiogram done last week shows EF 65 to 70% with grade 1 diastolic dysfunction Presently euvolemic Nephrology recommends potential discharge with loop diuretic However patient has a relatively normal heart and is presently euvolemic If loop diuretic is to be started, will need to be low-dose with close follow-up    DVT prophylaxis: SCD Code Status: Full Family Communication: None today     Studies: No results found.  Principal Problem:   Hyponatremia Active Problems:   COPD with asthma (HCC)   Gout   HLD (hyperlipidemia)   Ascending aortic aneurysm (HCC)   BPH (benign prostatic hyperplasia)   CAD (coronary artery disease)   Chronic myelogenous leukemia (HCC)   HTN (hypertension)   AKI (acute kidney injury) (HCC)   Hypotension   Alcohol abuse   Hypokalemia   Metabolic acidosis   Shock (HCC)     Jarone Ostergaard Tublu Kashlyn Salinas, [facility]  If 7PM-7AM, please contact night-coverage [email or web address]   LOS: 4 days

## 2023-12-06 NOTE — Progress Notes (Signed)
 Brief Nephrology Progress Note  Patient's labs continue to improve with creatinine at baseline and sodium up to 131.  We will stop urea and start torsemide daily.  We will sign off at this time.  Patient okay to discharge from nephrology perspective.  Should follow-up with PCP in the next 1 to 2 weeks for labs.  Ongoing counseling regarding alcohol cessation

## 2023-12-07 DIAGNOSIS — E871 Hypo-osmolality and hyponatremia: Secondary | ICD-10-CM | POA: Diagnosis not present

## 2023-12-07 LAB — RENAL FUNCTION PANEL
Albumin: 3.6 g/dL (ref 3.5–5.0)
Anion gap: 13 (ref 5–15)
BUN: 51 mg/dL — ABNORMAL HIGH (ref 8–23)
CO2: 29 mmol/L (ref 22–32)
Calcium: 10 mg/dL (ref 8.9–10.3)
Chloride: 94 mmol/L — ABNORMAL LOW (ref 98–111)
Creatinine, Ser: 1.02 mg/dL (ref 0.61–1.24)
GFR, Estimated: >60 mL/min (ref >60)
Glucose, Bld: 111 mg/dL — ABNORMAL HIGH (ref 70–99)
Phosphorus: 3.9 mg/dL (ref 2.5–4.6)
Potassium: 3.5 mmol/L (ref 3.5–5.1)
Sodium: 136 mmol/L (ref 135–145)

## 2023-12-07 MED ORDER — TRAZODONE HCL 50 MG PO TABS
25.0000 mg | ORAL_TABLET | Freq: Once | ORAL | Status: AC
  Administered 2023-12-07: 25 mg via ORAL
  Filled 2023-12-07: qty 1

## 2023-12-07 MED ORDER — POTASSIUM CHLORIDE CRYS ER 20 MEQ PO TBCR
20.0000 meq | EXTENDED_RELEASE_TABLET | Freq: Every day | ORAL | Status: DC
  Administered 2023-12-07 – 2023-12-11 (×5): 20 meq via ORAL
  Filled 2023-12-07 (×5): qty 1

## 2023-12-07 MED ORDER — HYDROCODONE-ACETAMINOPHEN 5-325 MG PO TABS
1.0000 | ORAL_TABLET | Freq: Four times a day (QID) | ORAL | Status: DC | PRN
  Administered 2023-12-07: 1 via ORAL
  Filled 2023-12-07: qty 1

## 2023-12-07 MED ORDER — OXYCODONE HCL 5 MG PO TABS
5.0000 mg | ORAL_TABLET | Freq: Four times a day (QID) | ORAL | Status: DC | PRN
  Administered 2023-12-07: 5 mg via ORAL
  Filled 2023-12-07: qty 1

## 2023-12-07 NOTE — Evaluation (Signed)
 Physical Therapy Evaluation Patient Details Name: Kyle Montoya MRN: [id number] DOB: [birth year] Today's Date: 12/07/2023  History of Present Illness  73 year old was admitted with progressive weakness, sudden onset dyspnea and increased lower extremity edema. nephrology and PCCM consultation d/t  hypovolemic hypotension requiring pressors, now discontinued,  improvement in kidney function and sodium. PMH:  PAF, HFrEF with EF 55 to 60%, H/o alcohol abuse, COPD and history of lung CA, cholangiocarcinoma and CML  Clinical Impression  Pt admitted with above diagnosis. Pt reports independence at baseline, lives alone. Pt agreeable to PT, Ox3 but noted to have delayed processing time, decr initiation needing multi-modal cues throughout session. Pt was able to amb 15' x2, perseverating on voiding however unsuccessful (primo fit in place) Pt will benefit from SNF post acute. MD in room and discussed plan as well with pt and family  Pt currently with functional limitations due to the deficits listed below (see PT Problem List). Pt will benefit from acute skilled PT to increase their independence and safety with mobility to allow discharge.           If plan is discharge home, recommend the following: A little help with walking and/or transfers;A little help with bathing/dressing/bathroom;Assistance with cooking/housework;Help with stairs or ramp for entrance   Can travel by private vehicle   Yes    Equipment Recommendations None recommended by PT  Recommendations for Other Services       Functional Status Assessment Patient has had a recent decline in their functional status and demonstrates the ability to make significant improvements in function in a reasonable and predictable amount of time.     Precautions / Restrictions Precautions Precautions: Fall Restrictions Weight Bearing Restrictions Per Provider Order: No      Mobility  Bed Mobility               General bed mobility  comments: in recliner on arrival and returned to same    Transfers Overall transfer level: Needs assistance Equipment used: Rolling walker (2 wheels) Transfers: Sit to/from Stand Sit to Stand: Min assist           General transfer comment: STS x3; min assist to transition to stand from recliner and toilet with cues for hand placement, knee flexion to power up with LEs    Ambulation/Gait Ambulation/Gait assistance: Min assist Gait Distance (Feet): 15 Feet (x2) Assistive device: Rolling walker (2 wheels) Gait Pattern/deviations: Step-through pattern, Decreased stride length, Trunk flexed       General Gait Details: cues for RW safety , trunk extension and incr step length. assist to steady, maneuver RW safely  Stairs            Wheelchair Mobility     Tilt Bed    Modified Rankin (Stroke Patients Only)       Balance Overall balance assessment: Needs assistance Sitting-balance support: Feet supported, No upper extremity supported Sitting balance-Leahy Scale: Good     Standing balance support: During functional activity, Reliant on assistive device for balance Standing balance-Leahy Scale: Poor                               Pertinent Vitals/Pain Pain Assessment Pain Assessment: No/denies pain    Home Living Family/patient expects to be discharged to:: Private residence Living Arrangements: Alone Available Help at Discharge: Family Type of Home: House           Home Equipment: None Additional Comments: pt brother  and sister-in-law visiting and present for session    Prior Function Prior Level of Function : Independent/Modified Independent                     Extremity/Trunk Assessment   Upper Extremity Assessment Upper Extremity Assessment: Defer to OT evaluation    Lower Extremity Assessment Lower Extremity Assessment: Overall WFL for tasks assessed    Cervical / Trunk Assessment Cervical / Trunk Assessment: Normal   Communication   Communication Communication: No apparent difficulties    Cognition Arousal: Alert Behavior During Therapy: WFL for tasks assessed/performed, Flat affect   PT - Cognitive impairments: Initiation, Problem solving, Sequencing                       PT - Cognition Comments: delayed processing, decr initiation Following commands: Intact       Cueing Cueing Techniques: Verbal cues, Tactile cues     General Comments      Exercises     Assessment/Plan    PT Assessment Patient needs continued PT services  PT Problem List Decreased strength;Decreased balance;Decreased activity tolerance;Decreased mobility;Decreased knowledge of use of DME;Pain       PT Treatment Interventions DME instruction;Gait training;Functional mobility training;Therapeutic activities;Patient/family education;Therapeutic exercise    PT Goals (Current goals can be found in the Care Plan section)  Acute Rehab PT Goals PT Goal Formulation: With patient Time For Goal Achievement: 12/21/23 Potential to Achieve Goals: Good    Frequency Min 3X/week     Co-evaluation               AM-PAC PT "6 Clicks" Mobility  Outcome Measure Help needed turning from your back to your side while in a flat bed without using bedrails?: A Little Help needed moving from lying on your back to sitting on the side of a flat bed without using bedrails?: A Little Help needed moving to and from a bed to a chair (including a wheelchair)?: A Little Help needed standing up from a chair using your arms (e.g., wheelchair or bedside chair)?: A Little Help needed to walk in hospital room?: A Little Help needed climbing 3-5 steps with a railing? : A Lot 6 Click Score: 17    End of Session Equipment Utilized During Treatment: Gait belt Activity Tolerance: Patient tolerated treatment well Patient left: with call bell/phone within reach;in chair;with chair alarm set;with family/visitor present Nurse  Communication: Mobility status PT Visit Diagnosis: Other abnormalities of gait and mobility (R26.89);History of falling (Z91.81)    Time: 4098-1191 PT Time Calculation (min) (ACUTE ONLY): 17 min   Charges:   PT Evaluation $PT Eval Low Complexity: 1 Low   PT General Charges $$ ACUTE PT VISIT: 1 Visit         Pallie Swigert, PT  Acute Rehab Dept [facility]) [phone]  12/07/2023   [facility] 12/07/2023, 2:38 PM

## 2023-12-07 NOTE — Progress Notes (Signed)
 Mobility Specialist - Progress Note   12/07/23 1504  Mobility  Activity Transferred from chair to bed  Level of Assistance Contact guard assist, steadying assist  Assistive Device Other (Comment) (HHA)  Range of Motion/Exercises Active Assistive  Activity Response Tolerated well  Mobility Referral Yes  Mobility visit 1 Mobility  Mobility Specialist Start Time (ACUTE ONLY) 1454  Mobility Specialist Stop Time (ACUTE ONLY) 1504  Mobility Specialist Time Calculation (min) (ACUTE ONLY) 10 min   Pt was found in chair trying to get up and assisted back to bed with NT in room. Was left with all needs met and call bell in reach.  Billey Chang Mobility Specialist

## 2023-12-07 NOTE — Progress Notes (Signed)
 PROGRESS NOTE    Kyle Montoya  MWU:[id number]  DOB: [birth year]  DOA: 12/02/2023 PCP: Lars Mage, NP Outpatient Specialists:   Hospital course:  73 year old with PAF, HFrEF with EF 55 to 60%, H/o alcohol abuse, COPD and history of lung CA, cholangiocarcinoma and CML was admitted with progressive weakness, sudden onset dyspnea and increased lower extremity edema.  Workup was noted for AKI, hyponatremia and metabolic acidosis as well as hypotension.  Patient has been treated with fluid resuscitation and with assistance of nephrology and PCCM consultation given hypovolemic hypotension requiring pressors, now discontinued and has done well with improvement in kidney function and sodium.  Subjective:  Patient is more awake today.  He was able to work with physical therapy.  Patient brother and sister-in-law were at bedside today.  They are worried about him because they feel that he drinks a lot and probably does not take his medications.  He has 2 children but they have not been to visit, they will contact them.   Objective: Vitals:   12/07/23 0823 12/07/23 0839 12/07/23 1204 12/07/23 1442  BP:  103/71 (!) 115/90 (!) 118/97  Pulse:   83 84  Resp:    13  Temp:   97.6 F (36.4 C) (!) 97.1 F (36.2 C)  TempSrc:   Oral Oral  SpO2: 99%  98% 98%  Weight:      Height:        Intake/Output Summary (Last 24 hours) at 12/07/2023 1701 Last data filed at 12/07/2023 0800 Gross per 24 hour  Intake 120 ml  Output 1250 ml  Net -1130 ml   Filed Weights   12/05/23 0439 12/06/23 0705 12/07/23 0500  Weight: 86.9 kg 86 kg 85.8 kg     Exam:  General: Tired appearing patient sitting in recliner working with physical therapy Eyes: sclera anicteric, conjuctiva mild injection bilaterally CVS: S1-S2, regular  Respiratory:  decreased air entry bilaterally secondary to decreased inspiratory effort, rales at bases  GI: NABS, soft, NT  LE: Warm and well-perfused Neuro: A/O x 3,  grossly  nonfocal.  Psych: patient is logical and coherent, judgement and insight appear normal, mood and affect appropriate to situation.  Data Reviewed:  Basic Metabolic Panel: Recent Labs  Lab 12/02/23 1159 12/02/23 1552 12/04/23 2045 12/05/23 0512 12/05/23 1639 12/06/23 0555 12/07/23 0501  NA 119*   < > 126* 128* 126* 131* 136  K 3.8   < > 3.9 3.4* 3.3* 4.0 3.5  CL 80*   < > 85* 87* 85* 91* 94*  CO2 21*   < > 29 26 29 30 29   GLUCOSE 119*   < > 141* 123* 159* 97 111*  BUN 94*   < > 75* 80* 80* 67* 51*  CREATININE 2.73*   < > 1.24 1.17 1.20 1.02 1.02  CALCIUM 7.5*   < > 9.0 9.2 9.6 9.8 10.0  MG 2.8*  --   --  2.1  --   --   --   PHOS  --   --   --  3.2  3.2 3.4 3.2 3.9   < > = values in this interval not displayed.    CBC: Recent Labs  Lab 12/02/23 0219 12/02/23 1552 12/05/23 0512 12/06/23 0555  WBC 10.6* 10.3 3.9* 5.4  HGB 15.4 13.2 12.7* 13.1  HCT 42.5 36.9* 36.8* 37.9*  MCV 104.2* 105.7* 107.6* 109.2*  PLT 348 316 230 223     Scheduled Meds:  arformoterol  15 mcg  Nebulization BID   Chlorhexidine Gluconate Cloth  6 each Topical Daily   folic acid  1 mg Oral Daily   heparin  5,000 Units Subcutaneous Q8H   midodrine  15 mg Oral TID WC   multivitamin with minerals  1 tablet Oral Daily   revefenacin  175 mcg Nebulization Daily   thiamine  100 mg Oral Daily   Or   thiamine  100 mg Intravenous Daily   torsemide  20 mg Oral Daily   Continuous Infusions:   Assessment & Plan:   Disposition Prescient physical therapy assessing patient today, plan is for referral to rehab Of note patient lives alone and according to his brother "drinks all the time" Patient has 2 children but neither have been up to visit him while he has been here Patient's brother will speak to his children about their concerns about his safe discharge Plan is to discharge patient to rehab from here when bed is available [facility] consult placed, patient is medically stable for discharge when bed  available  AKI secondary to ATN--resolved Hyponatremia--resolved Hypovolemic shock secondary to overdiuresis--now off pressors--resolved Patient has been treated with IV fluid resuscitation with resolution of hyponatremia hypovolemia and AKI Of note home medication showed that patient was on furosemide 80 twice daily as well as torsemide 60 daily In addition he was on a irbesartan, HCTZ which have both been discontinued Continue midodrine 15 3 times daily Patient should never be placed back on hydrochlorothiazide  Hypokalemia Normalized with repletion but is persistently low Will start patient on K-Dur 20 mill equivalents daily  Alcoholism Patient is doing well after 3 days of lorazepam Will discontinue CIWA protocol  Decompensated HFrEF Echocardiogram done last week shows EF 65 to 70% with grade 1 diastolic dysfunction Presently euvolemic Nephrology recommends potential discharge with loop diuretic However patient has a relatively normal heart and is presently euvolemic If loop diuretic is to be started, will need to be low-dose with close follow-up    DVT prophylaxis: SCD Code Status: Full Family Communication: None today     Studies: No results found.  Principal Problem:   Hyponatremia Active Problems:   COPD with asthma (HCC)   Gout   HLD (hyperlipidemia)   Ascending aortic aneurysm (HCC)   BPH (benign prostatic hyperplasia)   CAD (coronary artery disease)   Chronic myelogenous leukemia (HCC)   HTN (hypertension)   AKI (acute kidney injury) (HCC)   Hypotension   Alcohol abuse   Hypokalemia   Metabolic acidosis   Shock (HCC)     Hadessah Grennan Tublu Yuritzy Zehring, [facility]  If 7PM-7AM, please contact night-coverage [email or web address]   LOS: 5 days

## 2023-12-08 DIAGNOSIS — E871 Hypo-osmolality and hyponatremia: Secondary | ICD-10-CM | POA: Diagnosis not present

## 2023-12-08 LAB — RENAL FUNCTION PANEL
Albumin: 3.7 g/dL (ref 3.5–5.0)
Anion gap: 10 (ref 5–15)
BUN: 59 mg/dL — ABNORMAL HIGH (ref 8–23)
CO2: 31 mmol/L (ref 22–32)
Calcium: 10 mg/dL (ref 8.9–10.3)
Chloride: 93 mmol/L — ABNORMAL LOW (ref 98–111)
Creatinine, Ser: 1.44 mg/dL — ABNORMAL HIGH (ref 0.61–1.24)
GFR, Estimated: 51 mL/min — ABNORMAL LOW (ref >60)
Glucose, Bld: 112 mg/dL — ABNORMAL HIGH (ref 70–99)
Phosphorus: 3.8 mg/dL (ref 2.5–4.6)
Potassium: 3.7 mmol/L (ref 3.5–5.1)
Sodium: 134 mmol/L — ABNORMAL LOW (ref 135–145)

## 2023-12-08 MED ORDER — SODIUM CHLORIDE 0.9 % IV SOLN: INTRAVENOUS | Status: AC

## 2023-12-08 NOTE — TOC Progression Note (Addendum)
 Transition of Care [facility]) - Progression Note    Patient Details  Name: Mekhai Venuto MRN: [id number] Date of Birth: Sep 29, 1950  Transition of Care [facility]) CM/SW Contact  Charly Holcomb, Olegario Messier, RN Phone Number: 12/08/2023, 10:02 AM  Clinical Narrative:  Spoke to dtr [name]-agree to fax out for ST SNF-aware of hx etoh. Confirmed w/[name] patient still working Part time w/[employer] primary admit rep Kendal Hymen also confirmed medicare 2ndary. Await pasrr, & bed offers. -received pasrr- [phone] A-added to fl2.    Expected Discharge Plan: Skilled Nursing Facility Barriers to Discharge: Continued Medical Work up  Expected Discharge Plan and Services In-house Referral: Clinical Social Work Discharge Planning Services: CM Consult Post Acute Care Choice: Skilled Nursing Facility Living arrangements for the past 2 months: Single Family Home                 DME Arranged: N/A DME Agency: NA                   Social Determinants of Health (SDOH) Interventions SDOH Screenings   Food Insecurity: No Food Insecurity (12/02/2023)  Housing: Low Risk  (12/02/2023)  Transportation Needs: No Transportation Needs (12/02/2023)  Utilities: Not At Risk (12/02/2023)  Social Connections: Moderately Isolated (12/02/2023)  Tobacco Use: Medium Risk (12/02/2023)    Readmission Risk Interventions    12/03/2023   10:44 AM 10/09/2023    3:26 PM  Readmission Risk Prevention Plan  Transportation Screening Complete Complete  PCP or Specialist Appt within 5-7 Days  Complete  Home Care Screening  Complete  Medication Review (RN CM)  Complete  HRI or Home Care Consult Complete   Social Work Consult for Recovery Care Planning/Counseling Complete   Palliative Care Screening Not Applicable   Medication Review Oceanographer) Complete

## 2023-12-08 NOTE — TOC PASRR Note (Signed)
 30 Day PASRR Note   Patient Details  Name: Kyle Montoya Date of Birth: 1951-03-22   Transition of Care [facility]) CM/SW Contact:    Lanier Clam, RN Phone Number: 12/08/2023, 9:56 AM  To Whom It May Concern:  Please be advised that this patient will require a short-term nursing home stay - anticipated 30 days or less for rehabilitation and strengthening.   The plan is for return home.

## 2023-12-08 NOTE — NC FL2 (Addendum)
 [place] MEDICAID FL2 LEVEL OF CARE FORM     IDENTIFICATION  Patient Name: Kyle Montoya Birthdate: 1951-03-16 Sex: male Admission Date (Current Location): 12/02/2023  [facility] and [place] Number:  Producer, television/film/video and Address:  [facility],  [address] [place], [place] [address]      Provider Number: [id number]  Attending Physician Name and Address:  Harold Hedge, MD  Relative Name and Phone Number:  [name] Puello(dtr) [phone]    Current Level of Care: Hospital Recommended Level of Care: Skilled Nursing Facility Prior Approval Number:    Date Approved/Denied:   PASRR Number:  [id number]   Discharge Plan: SNF    Current Diagnoses: Patient Active Problem List   Diagnosis Date Noted   AKI (acute kidney injury) (HCC) 12/02/2023   Hypotension 12/02/2023   Alcohol abuse 12/02/2023   Hypokalemia 12/02/2023   Metabolic acidosis 12/02/2023   Shock (HCC) 12/02/2023   Orthostatic hypotension 10/10/2023   Acute diastolic (congestive) heart failure (HCC) 10/08/2023   Congestive heart failure (CHF) (HCC) 09/26/2023   BPH (benign prostatic hyperplasia)    CAD (coronary artery disease)    Chronic myelogenous leukemia (HCC)    COPD (chronic obstructive pulmonary disease) (HCC)    DJD (degenerative joint disease)    Dyslipidemia    Family history of colonic polyps    HTN (hypertension)    Hypercholesterolemia    Lung cancer (HCC)    Myelogenous leukemia (HCC)    OA (osteoarthritis)    PAD (peripheral artery disease) (HCC)    Renal insufficiency    Tendonitis    Hyponatremia 09/19/2023   High coronary artery calcium score 05/30/2023   Diuretic-induced hypokalemia 05/23/2023   Ascending aortic aneurysm (HCC) 06/27/2022   Thoracic aortic aneurysm (HCC) 06/27/2022   Abnormal transaminases 02/27/2022   History of colonic polyps 10/01/2021   Family history of prostate cancer 10/01/2021   Hypocalcemia 08/27/2021   Adenomatous polyp of descending  colon 02/23/2021   History of colon polyps 02/23/2021   Abnormal colonoscopy 02/23/2021   Personal history of gastric ulcers 02/23/2021   Hypercalcemia 08/10/2020   Full thickness rotator cuff tear 08/08/2020   Impingement syndrome of both shoulders 03/31/2020   Shoulder pain, right 03/29/2020   Shoulder pain, left 03/29/2020   Adverse drug reaction, initial encounter 07/04/2016   Adult BMI 30+ 01/31/2015   Benign fibroma of prostate 01/31/2015   Cataract 01/31/2015   Chronic cough 01/31/2015   Chronic pain 01/31/2015   Arthritis, degenerative 01/31/2015   HLD (hyperlipidemia) 01/31/2015   Cannot sleep 01/31/2015   Non-small cell carcinoma of lung (HCC) 01/31/2015   Compulsive tobacco user syndrome 01/31/2015   Wheezing 01/31/2015   CML (chronic myelocytic leukemia) (HCC) 10/18/2014   Gout 10/18/2014   Cervical osteoarthritis 10/18/2014   Carcinoma of biliary tract (HCC) 02/15/2013   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   Anemia in chronic illness 01/22/2013   COPD with asthma (HCC) 01/22/2013   BP (high blood pressure) 01/22/2013    Orientation RESPIRATION BLADDER Height & Weight     Self, Time, Situation, Place  Normal Continent Weight: 86 kg Height:  5\' 11"  (180.3 cm)  BEHAVIORAL SYMPTOMS/MOOD NEUROLOGICAL BOWEL NUTRITION STATUS      Continent Diet (Regular)  AMBULATORY STATUS COMMUNICATION OF NEEDS Skin   Limited Assist Verbally Normal                       Personal Care Assistance Level of Assistance  Bathing, Feeding, Dressing Bathing Assistance: Limited assistance Feeding assistance: Limited assistance Dressing Assistance: Limited assistance     Functional Limitations Info  Sight, Hearing, Speech Sight Info: Impaired (eyeglasses) Hearing Info: Adequate Speech Info: Impaired (Dentures-top/bottom)    SPECIAL CARE FACTORS FREQUENCY  PT (By licensed PT), OT (By licensed OT)     PT Frequency: 5x week OT Frequency: 5x week             Contractures Contractures Info: Not present    Additional Factors Info  Code Status, Allergies, Psychotropic Code Status Info: Full Allergies Info: Pnemococcal vaccines Psychotropic Info: trazadone         Current Medications (12/08/2023):  This is the current hospital active medication list Current Facility-Administered Medications  Medication Dose Route Frequency Provider Last Rate Last Admin   0.9 %  sodium chloride infusion   Intravenous Continuous Harold Hedge, MD 75 mL/hr at 12/08/23 0946 New Bag at 12/08/23 0946   acetaminophen (TYLENOL) tablet 650 mg  650 mg Oral Q6H PRN Howerter, Justin B, DO   650 mg at 12/06/23 1610   Or   acetaminophen (TYLENOL) suppository 650 mg  650 mg Rectal Q6H PRN Howerter, Justin B, DO       albuterol (PROVENTIL) (2.5 MG/3ML) 0.083% nebulizer solution 2.5 mg  2.5 mg Nebulization Q4H PRN Bobette Mo, MD   2.5 mg at 12/03/23 2339   arformoterol (BROVANA) nebulizer solution 15 mcg  15 mcg Nebulization BID Olalere, Adewale A, MD   15 mcg at 12/08/23 0902   docusate sodium (COLACE) capsule 100 mg  100 mg Oral BID PRN Virl Diamond A, MD       folic acid (FOLVITE) tablet 1 mg  1 mg Oral Daily Howerter, Justin B, DO   1 mg at 12/08/23 0908   heparin injection 5,000 Units  5,000 Units Subcutaneous Q8H Olalere, Adewale A, MD   5,000 Units at 12/08/23 0543   HYDROcodone-acetaminophen (NORCO/VICODIN) 5-325 MG per tablet 1 tablet  1 tablet Oral Q6H PRN Pieter Partridge, MD   1 tablet at 12/07/23 2158   midodrine (PROAMATINE) tablet 15 mg  15 mg Oral TID WC Olalere, Adewale A, MD   15 mg at 12/08/23 0909   multivitamin with minerals tablet 1 tablet  1 tablet Oral Daily Howerter, Justin B, DO   1 tablet at 12/08/23 0909   ondansetron (ZOFRAN) injection 4 mg  4 mg Intravenous Q6H PRN Howerter, Justin B, DO   4 mg at 12/02/23 0402   Oral care mouth rinse  15 mL Mouth Rinse PRN Olalere, Adewale A, MD       polyethylene glycol (MIRALAX /  GLYCOLAX) packet 17 g  17 g Oral Daily PRN Olalere, Adewale A, MD   17 g at 12/05/23 2148   potassium chloride SA (KLOR-CON M) CR tablet 20 mEq  20 mEq Oral Daily Leandro Reasoner Tublu, MD   20 mEq at 12/08/23 9604   revefenacin (YUPELRI) nebulizer solution 175 mcg  175 mcg Nebulization Daily Olalere, Adewale A, MD   175 mcg at 12/08/23 5409   thiamine (VITAMIN B1) tablet 100 mg  100 mg Oral Daily Howerter, Justin B, DO   100 mg at 12/08/23 0909   Or   thiamine (VITAMIN B1) injection 100 mg  100 mg Intravenous Daily Howerter, Justin B, DO       torsemide (DEMADEX) tablet 20 mg  20 mg Oral Daily Darnell Level, MD   20 mg at 12/08/23 [phone]  Discharge Medications: Please see discharge summary for a list of discharge medications.  Relevant Imaging Results:  Relevant Lab Results:   Additional Information [facility] [id number] [address] [place], [place]

## 2023-12-08 NOTE — Progress Notes (Signed)
 PROGRESS NOTE    Kyle Montoya  ZOX:[id number]  DOB: June 27, 1951  DOA: 12/02/2023 PCP: Lars Mage, NP Outpatient Specialists:   Hospital course:  73 year old with PAF, HFrEF with EF 55 to 60%, H/o alcohol abuse, COPD and history of lung CA, cholangiocarcinoma and CML was admitted with progressive weakness, sudden onset dyspnea and increased lower extremity edema.  Workup was noted for AKI, hyponatremia and metabolic acidosis as well as hypotension.  Patient has been treated with fluid resuscitation and with assistance of nephrology and PCCM consultation given hypovolemic hypotension requiring pressors, now discontinued and has done well with improvement in kidney function and sodium.  Subjective:  Patient is more awake today.  He has no new complaints.  Patient is currently awaiting placement in a skilled nursing facility.  Review of her labs shows slight trend up of creatinine.   Objective: Vitals:   12/07/23 2021 12/08/23 0420 12/08/23 0500 12/08/23 0902  BP:  104/74    Pulse:  85    Resp:  16    Temp:  98 F (36.7 C)    TempSrc:  Oral    SpO2: 98% 100%  96%  Weight:   86 kg   Height:        Intake/Output Summary (Last 24 hours) at 12/08/2023 1145 Last data filed at 12/08/2023 0946 Gross per 24 hour  Intake 420 ml  Output --  Net 420 ml   Filed Weights   12/06/23 0705 12/07/23 0500 12/08/23 0500  Weight: 86 kg 85.8 kg 86 kg     Exam:  General: Tired appearing patient sitting in recliner working with physical therapy Eyes: sclera anicteric, conjuctiva mild injection bilaterally CVS: S1-S2, regular  Respiratory:  decreased air entry bilaterally secondary to decreased inspiratory effort, rales at bases  GI: NABS, soft, NT  LE: Warm and well-perfused Neuro: A/O x 3,  grossly nonfocal.  Psych: patient is logical and coherent, judgement and insight appear normal, mood and affect appropriate to situation.  Data Reviewed:  Basic Metabolic Panel: Recent Labs   Lab 12/02/23 1159 12/02/23 1552 12/05/23 0512 12/05/23 1639 12/06/23 0555 12/07/23 0501 12/08/23 0419  NA 119*   < > 128* 126* 131* 136 134*  K 3.8   < > 3.4* 3.3* 4.0 3.5 3.7  CL 80*   < > 87* 85* 91* 94* 93*  CO2 21*   < > 26 29 30 29 31   GLUCOSE 119*   < > 123* 159* 97 111* 112*  BUN 94*   < > 80* 80* 67* 51* 59*  CREATININE 2.73*   < > 1.17 1.20 1.02 1.02 1.44*  CALCIUM 7.5*   < > 9.2 9.6 9.8 10.0 10.0  MG 2.8*  --  2.1  --   --   --   --   PHOS  --   --  3.2  3.2 3.4 3.2 3.9 3.8   < > = values in this interval not displayed.    CBC: Recent Labs  Lab 12/02/23 0219 12/02/23 1552 12/05/23 0512 12/06/23 0555  WBC 10.6* 10.3 3.9* 5.4  HGB 15.4 13.2 12.7* 13.1  HCT 42.5 36.9* 36.8* 37.9*  MCV 104.2* 105.7* 107.6* 109.2*  PLT 348 316 230 223     Scheduled Meds:  arformoterol  15 mcg Nebulization BID   folic acid  1 mg Oral Daily   heparin  5,000 Units Subcutaneous Q8H   midodrine  15 mg Oral TID WC   multivitamin with minerals  1  tablet Oral Daily   potassium chloride  20 mEq Oral Daily   revefenacin  175 mcg Nebulization Daily   thiamine  100 mg Oral Daily   Or   thiamine  100 mg Intravenous Daily   torsemide  20 mg Oral Daily   Continuous Infusions:  sodium chloride 75 mL/hr at 12/08/23 0946     Assessment & Plan:   Disposition Prescient physical therapy assessing patient today, plan is for referral to rehab Of note patient lives alone and according to his brother "drinks all the time" Patient has 2 children but neither have been up to visit him while he has been here Patient's brother will speak to his children about their concerns about his safe discharge Plan is to discharge patient to rehab from here when bed is available [facility] consult placed, patient is medically stable for discharge when bed available  AKI secondary to ATN--resolved Hyponatremia--resolved Hypovolemic shock secondary to overdiuresis--now off pressors--resolved Patient has been  treated with IV fluid resuscitation with resolution of hyponatremia hypovolemia and AKI Of note home medication showed that patient was on furosemide 80 twice daily as well as torsemide 60 daily In addition he was on a irbesartan, HCTZ which have both been discontinued Continue midodrine 15 3 times daily Patient should never be placed back on hydrochlorothiazide Creatinine trended up from 1.0-1.4, discontinue torsemide initiated the patient on IV fluids for 12 hours.  Hypokalemia Normalized with repletion but is persistently low Will start patient on K-Dur 20 mill equivalents daily  Alcoholism Patient is doing well after 3 days of lorazepam Will discontinue CIWA protocol  Decompensated HFrEF Echocardiogram done last week shows EF 65 to 70% with grade 1 diastolic dysfunction Presently euvolemic Nephrology recommends potential discharge with loop diuretic However patient has a relatively normal heart and is presently euvolemic If loop diuretic is to be started, will need to be low-dose with close follow-up    DVT prophylaxis: SCD Code Status: Full Family Communication: None today     Studies: No results found.  Principal Problem:   Hyponatremia Active Problems:   COPD with asthma (HCC)   Gout   HLD (hyperlipidemia)   Ascending aortic aneurysm (HCC)   BPH (benign prostatic hyperplasia)   CAD (coronary artery disease)   Chronic myelogenous leukemia (HCC)   HTN (hypertension)   AKI (acute kidney injury) (HCC)   Hypotension   Alcohol abuse   Hypokalemia   Metabolic acidosis   Shock (HCC)     Chianna Spirito P Angeligue Bowne, [facility]  If 7PM-7AM, please contact night-coverage [email or web address]   LOS: 6 days

## 2023-12-09 DIAGNOSIS — E871 Hypo-osmolality and hyponatremia: Secondary | ICD-10-CM | POA: Diagnosis not present

## 2023-12-09 NOTE — TOC Progression Note (Addendum)
 Transition of Care [facility]) - Progression Note    Patient Details  Name: Kyle Montoya MRN: [id number] Date of Birth: 09-18-1951  Transition of Care [facility]) CM/SW Contact  Revanth Neidig, Olegario Messier, RN Phone Number: 12/09/2023, 10:58 AM  Clinical Narrative:  Bed offers given await choice from patient/dtr [name] prior auth from [facility] facility to get auth..  -dtr [name] chose [facility] rep Tiffany following for auth. Await auth from [employer] rep from [employer].   Expected Discharge Plan: Skilled Nursing Facility Barriers to Discharge: Continued Medical Work up  Expected Discharge Plan and Services In-house Referral: Clinical Social Work Discharge Planning Services: CM Consult Post Acute Care Choice: Skilled Nursing Facility Living arrangements for the past 2 months: Single Family Home                 DME Arranged: N/A DME Agency: NA                   Social Determinants of Health (SDOH) Interventions SDOH Screenings   Food Insecurity: No Food Insecurity (12/02/2023)  Housing: Low Risk  (12/02/2023)  Transportation Needs: No Transportation Needs (12/02/2023)  Utilities: Not At Risk (12/02/2023)  Social Connections: Moderately Isolated (12/02/2023)  Tobacco Use: Medium Risk (12/02/2023)    Readmission Risk Interventions    12/08/2023   10:22 AM 12/03/2023   10:44 AM 10/09/2023    3:26 PM  Readmission Risk Prevention Plan  Transportation Screening  Complete Complete  PCP or Specialist Appt within 5-7 Days   Complete  PCP or Specialist Appt within 3-5 Days Complete    Home Care Screening   Complete  Medication Review (RN CM)   Complete  HRI or Home Care Consult Complete Complete   Social Work Consult for Recovery Care Planning/Counseling  Complete   Palliative Care Screening Complete Not Applicable   Medication Review Oceanographer) Complete Complete

## 2023-12-09 NOTE — Progress Notes (Signed)
 PROGRESS NOTE    Kyle Montoya  TKZ:[id number] DOB: Dec 30, 1950 DOA: 12/02/2023 PCP: Lars Mage, NP    Brief Narrative:  This 73 year old Male with PMH significant for PAF, HFrEF with EF 55 to 60%, H/o alcohol abuse, COPD and history of lung CA, cholangiocarcinoma and CML was admitted with progressive weakness, sudden onset dyspnea and increased lower extremity edema. Workup was noted for AKI, hyponatremia and metabolic acidosis as well as hypotension. Patient has been treated with fluid resuscitation and with assistance of nephrology and PCCM consultation given hypovolemic hypotension requiring pressors, now discontinued and has done well with improvement in kidney function and sodium.   Assessment & Plan:   Principal Problem:   Hyponatremia Active Problems:   COPD with asthma (HCC)   Gout   HLD (hyperlipidemia)   Ascending aortic aneurysm (HCC)   BPH (benign prostatic hyperplasia)   CAD (coronary artery disease)   Chronic myelogenous leukemia (HCC)   HTN (hypertension)   AKI (acute kidney injury) (HCC)   Hypotension   Alcohol abuse   Hypokalemia   Metabolic acidosis   Shock (HCC)   AKI secondary to ATN >  Now resolved: Hyponatremia--resolved. Hypovolemic shock secondary to overdiuresis--now off pressors--resolved. Patient has been treated with IV fluid resuscitation with resolution of hyponatremia,  hypovolemia and AKI. Of note home medication showed that patient was on furosemide 80 twice daily as well as torsemide 60 daily. In addition he was on a irbesartan, HCTZ which have both been discontinued Continue midodrine 15 mg 3 times daily. Patient should never be placed back on hydrochlorothiazide Creatinine trended up from 1.0-1.4, discontinue torsemide.   Hypokalemia: Continue K-Dur 20 mill equivalents daily.   Alcoholism Patient is doing well after 3 days of lorazepam. Discontinue CIWA protocol.   Decompensated HFrEF Echocardiogram done last week shows EF 65  to 70% with grade 1 diastolic dysfunction Presently euvolemic Nephrology recommends potential discharge with loop diuretics. However patient has a relatively normal heart and is presently euvolemic If loop diuretic is to be started, will need to be low-dose with close follow-up    DVT prophylaxis: SCDs Code Status: Full code Family Communication: Disposition Plan:   Status is: Inpatient Remains inpatient appropriate because: Awaiting SNF placement   Consultants:  None  Procedures: CT A/P  Antimicrobials:  Anti-infectives (From admission, onward)    None      Subjective: Patient was seen and examined at bedside.Overnight events noted.   Patient reports doing much better,  feeling improved.   He is awaiting insurance authorization for discharge to SNF.  Objective: Vitals:   12/08/23 1829 12/09/23 0500 12/09/23 0506 12/09/23 0750  BP:   (!) 127/94   Pulse:   85   Resp:   20   Temp:   98 F (36.7 C)   TempSrc:   Oral   SpO2: 99%  99% 98%  Weight:  87 kg    Height:        Intake/Output Summary (Last 24 hours) at 12/09/2023 1142 Last data filed at 12/08/2023 2130 Gross per 24 hour  Intake 1063.15 ml  Output 400 ml  Net 663.15 ml   Filed Weights   12/07/23 0500 12/08/23 0500 12/09/23 0500  Weight: 85.8 kg 86 kg 87 kg    Examination:  General exam: Appears calm and comfortable, not in any acute distress. Respiratory system: CTA bilaterally.  respiratory effort normal. RR 15 Cardiovascular system: S1 & S2 heard, RRR. No JVD, murmurs, rubs, gallops or clicks. No pedal edema. Gastrointestinal  system: Abdomen is non distended, soft and non tender.  Normal bowel sounds heard. Central nervous system: Alert and oriented x 3. No focal neurological deficits. Extremities: No edema, no cyanosis, no clubbing. Skin: No rashes, lesions or ulcers Psychiatry: Judgement and insight appear normal. Mood & affect appropriate.     Data Reviewed: I have personally reviewed  following labs and imaging studies  CBC: Recent Labs  Lab 12/02/23 1552 12/05/23 0512 12/06/23 0555  WBC 10.3 3.9* 5.4  HGB 13.2 12.7* 13.1  HCT 36.9* 36.8* 37.9*  MCV 105.7* 107.6* 109.2*  PLT 316 230 223   Basic Metabolic Panel: Recent Labs  Lab 12/02/23 1159 12/02/23 1552 12/05/23 0512 12/05/23 1639 12/06/23 0555 12/07/23 0501 12/08/23 0419  NA 119*   < > 128* 126* 131* 136 134*  K 3.8   < > 3.4* 3.3* 4.0 3.5 3.7  CL 80*   < > 87* 85* 91* 94* 93*  CO2 21*   < > 26 29 30 29 31   GLUCOSE 119*   < > 123* 159* 97 111* 112*  BUN 94*   < > 80* 80* 67* 51* 59*  CREATININE 2.73*   < > 1.17 1.20 1.02 1.02 1.44*  CALCIUM 7.5*   < > 9.2 9.6 9.8 10.0 10.0  MG 2.8*  --  2.1  --   --   --   --   PHOS  --   --  3.2  3.2 3.4 3.2 3.9 3.8   < > = values in this interval not displayed.   GFR: Estimated Creatinine Clearance: 47.9 mL/min (A) (by C-G formula based on SCr of 1.44 mg/dL (H)). Liver Function Tests: Recent Labs  Lab 12/02/23 1159 12/05/23 0512 12/05/23 1639 12/06/23 0555 12/07/23 0501 12/08/23 0419  AST 70*  --   --   --   --   --   ALT 36  --   --   --   --   --   ALKPHOS 71  --   --   --   --   --   BILITOT 1.0  --   --   --   --   --   PROT 6.9  --   --   --   --   --   ALBUMIN 3.3* 3.5 3.5 3.5 3.6 3.7   No results for input(s): "LIPASE", "AMYLASE" in the last 168 hours. No results for input(s): "AMMONIA" in the last 168 hours. Coagulation Profile: No results for input(s): "INR", "PROTIME" in the last 168 hours. Cardiac Enzymes: Recent Labs  Lab 12/02/23 1159  CKTOTAL 143   BNP (last 3 results) No results for input(s): "PROBNP" in the last 8760 hours. HbA1C: No results for input(s): "HGBA1C" in the last 72 hours. CBG: Recent Labs  Lab 12/04/23 0843  GLUCAP 156*   Lipid Profile: No results for input(s): "CHOL", "HDL", "LDLCALC", "TRIG", "CHOLHDL", "LDLDIRECT" in the last 72 hours. Thyroid Function Tests: No results for input(s): "TSH",  "T4TOTAL", "FREET4", "T3FREE", "THYROIDAB" in the last 72 hours. Anemia Panel: No results for input(s): "VITAMINB12", "FOLATE", "FERRITIN", "TIBC", "IRON", "RETICCTPCT" in the last 72 hours. Sepsis Labs: No results for input(s): "PROCALCITON", "LATICACIDVEN" in the last 168 hours.  Recent Results (from the past 240 hours)  MRSA Next Gen by PCR, Nasal     Status: None   Collection Time: 12/02/23 12:02 AM   Specimen: Nasal Mucosa; Nasal Swab  Result Value Ref Range Status   MRSA by PCR Next  Gen NOT DETECTED NOT DETECTED Final    Comment: (NOTE) The GeneXpert MRSA Assay (FDA approved for NASAL specimens only), is one component of a comprehensive MRSA colonization surveillance program. It is not intended to diagnose MRSA infection nor to guide or monitor treatment for MRSA infections. Test performance is not FDA approved in patients less than 98 years old. Performed at [facility], [address] [place], [place] [address]   Resp panel by RT-PCR (RSV, Flu A&B, Covid) Anterior Nasal Swab     Status: None   Collection Time: 12/02/23  2:28 AM   Specimen: Anterior Nasal Swab  Result Value Ref Range Status   SARS Coronavirus 2 by RT PCR NEGATIVE NEGATIVE Final    Comment: (NOTE) SARS-CoV-2 target nucleic acids are NOT DETECTED.  The SARS-CoV-2 RNA is generally detectable in upper respiratory specimens during the acute phase of infection. The lowest concentration of SARS-CoV-2 viral copies this assay can detect is 138 copies/mL. A negative result does not preclude SARS-Cov-2 infection and should not be used as the sole basis for treatment or other patient management decisions. A negative result may occur with  improper specimen collection/handling, submission of specimen other than nasopharyngeal swab, presence of viral mutation(s) within the areas targeted by this assay, and inadequate number of viral copies(<138 copies/mL). A negative result must be combined  with clinical observations, patient history, and epidemiological information. The expected result is Negative.  Fact Sheet for Patients:  BloggerCourse.com  Fact Sheet for Healthcare Providers:  SeriousBroker.it  This test is no t yet approved or cleared by the [place] FDA and  has been authorized for detection and/or diagnosis of SARS-CoV-2 by FDA under an Emergency Use Authorization (EUA). This EUA will remain  in effect (meaning this test can be used) for the duration of the COVID-19 declaration under Section 564(b)(1) of the Act, 21 U.S.C.section 360bbb-3(b)(1), unless the authorization is terminated  or revoked sooner.       Influenza A by PCR NEGATIVE NEGATIVE Final   Influenza B by PCR NEGATIVE NEGATIVE Final    Comment: (NOTE) The Xpert Xpress SARS-CoV-2/FLU/RSV plus assay is intended as an aid in the diagnosis of influenza from Nasopharyngeal swab specimens and should not be used as a sole basis for treatment. Nasal washings and aspirates are unacceptable for Xpert Xpress SARS-CoV-2/FLU/RSV testing.  Fact Sheet for Patients: BloggerCourse.com  Fact Sheet for Healthcare Providers: SeriousBroker.it  This test is not yet approved or cleared by the [place] FDA and has been authorized for detection and/or diagnosis of SARS-CoV-2 by FDA under an Emergency Use Authorization (EUA). This EUA will remain in effect (meaning this test can be used) for the duration of the COVID-19 declaration under Section 564(b)(1) of the Act, 21 U.S.C. section 360bbb-3(b)(1), unless the authorization is terminated or revoked.     Resp Syncytial Virus by PCR NEGATIVE NEGATIVE Final    Comment: (NOTE) Fact Sheet for Patients: BloggerCourse.com  Fact Sheet for Healthcare Providers: SeriousBroker.it  This test is not yet approved  or cleared by the [place] FDA and has been authorized for detection and/or diagnosis of SARS-CoV-2 by FDA under an Emergency Use Authorization (EUA). This EUA will remain in effect (meaning this test can be used) for the duration of the COVID-19 declaration under Section 564(b)(1) of the Act, 21 U.S.C. section 360bbb-3(b)(1), unless the authorization is terminated or revoked.  Performed at [facility], [address] [place], [place] [address]    Radiology Studies:  No results found.  Scheduled Meds:  arformoterol  15 mcg Nebulization BID   folic acid  1 mg Oral Daily   heparin  5,000 Units Subcutaneous Q8H   midodrine  15 mg Oral TID WC   multivitamin with minerals  1 tablet Oral Daily   potassium chloride  20 mEq Oral Daily   revefenacin  175 mcg Nebulization Daily   thiamine  100 mg Oral Daily   Or   thiamine  100 mg Intravenous Daily   Continuous Infusions:   LOS: 7 days    Time spent: 50 mins    Willeen Niece, MD [facility]   If 7PM-7AM, please contact night-coverage

## 2023-12-09 NOTE — Progress Notes (Signed)
 Physical Therapy Treatment Patient Details Name: Kyle Montoya MRN: [id number] DOB: 03-06-1951 Today's Date: 12/09/2023   History of Present Illness 73 year old was admitted with progressive weakness, sudden onset dyspnea and increased lower extremity edema. nephrology and PCCM consultation d/t  hypovolemic hypotension requiring pressors, now discontinued,  improvement in kidney function and sodium. PMH:  PAF, HFrEF with EF 55 to 60%, H/o alcohol abuse, COPD and history of lung CA, cholangiocarcinoma and CML    PT Comments  Pt agreeable to working with therapy. Min A for safe mobility. Remains at risk for falls when mobilizing. Pt participated well. Patient will benefit from continued inpatient follow up therapy, <3 hours/day to regain independence.    If plan is discharge home, recommend the following: A little help with walking and/or transfers;A little help with bathing/dressing/bathroom;Assistance with cooking/housework;Help with stairs or ramp for entrance   Can travel by private vehicle        Equipment Recommendations  None recommended by PT    Recommendations for Other Services       Precautions / Restrictions Precautions Precautions: Fall Restrictions Weight Bearing Restrictions Per Provider Order: No     Mobility  Bed Mobility Overal bed mobility: Needs Assistance Bed Mobility: Supine to Sit     Supine to sit: Supervision, HOB elevated     General bed mobility comments: Increased time. Cues for safety. Assist to don slippers    Transfers Overall transfer level: Needs assistance Equipment used: Rolling walker (2 wheels) Transfers: Sit to/from Stand Sit to Stand: Min assist           General transfer comment: Assist to rise, steady, control descent. Cues for safety, technique, hand placement    Ambulation/Gait Ambulation/Gait assistance: Min assist Gait Distance (Feet): 50 Feet Assistive device: Rolling walker (2 wheels) Gait Pattern/deviations:  Step-through pattern, Decreased stride length       General Gait Details: Cues for safety, Rw proximity. Slow, effortful gait. Pt reports legs are weak. Fatigues easily. Assist to steady pt throughout distance. Fall risk.   Stairs             Wheelchair Mobility     Tilt Bed    Modified Rankin (Stroke Patients Only)       Balance Overall balance assessment: Needs assistance         Standing balance support: During functional activity, Reliant on assistive device for balance Standing balance-Leahy Scale: Poor                              Communication    Cognition Arousal: Alert Behavior During Therapy: WFL for tasks assessed/performed   PT - Cognitive impairments: Memory                       PT - Cognition Comments: didn't remember he was no longer wearing the primofit Following commands: Intact      Cueing Cueing Techniques: Verbal cues  Exercises      General Comments        Pertinent Vitals/Pain Pain Assessment Pain Assessment: No/denies pain    Home Living                          Prior Function            PT Goals (current goals can now be found in the care plan section) Progress towards PT goals: Progressing toward goals  Frequency    Min 2X/week      PT Plan      Co-evaluation              AM-PAC PT "6 Clicks" Mobility   Outcome Measure  Help needed turning from your back to your side while in a flat bed without using bedrails?: A Little Help needed moving from lying on your back to sitting on the side of a flat bed without using bedrails?: A Little Help needed moving to and from a bed to a chair (including a wheelchair)?: A Little Help needed standing up from a chair using your arms (e.g., wheelchair or bedside chair)?: A Little Help needed to walk in hospital room?: A Little Help needed climbing 3-5 steps with a railing? : Total 6 Click Score: 16    End of Session Equipment  Utilized During Treatment: Gait belt Activity Tolerance: Patient tolerated treatment well;Patient limited by fatigue Patient left: in chair;with call bell/phone within reach;with nursing/sitter in room (sitting on window seat while NT changed linens)   PT Visit Diagnosis: Muscle weakness (generalized) (M62.81);History of falling (Z91.81);Difficulty in walking, not elsewhere classified (R26.2)     Time: 1610-9604 PT Time Calculation (min) (ACUTE ONLY): 11 min  Charges:    $Gait Training: 8-22 mins PT General Charges $$ ACUTE PT VISIT: 1 Visit                         Faye Ramsay, PT Acute Rehabilitation  Office: [phone]

## 2023-12-10 DIAGNOSIS — E871 Hypo-osmolality and hyponatremia: Secondary | ICD-10-CM | POA: Diagnosis not present

## 2023-12-10 LAB — CBC
HCT: 35.3 % — ABNORMAL LOW (ref 39.0–52.0)
Hemoglobin: 12.1 g/dL — ABNORMAL LOW (ref 13.0–17.0)
MCH: 37.5 pg — ABNORMAL HIGH (ref 26.0–34.0)
MCHC: 34.3 g/dL (ref 30.0–36.0)
MCV: 109.3 fL — ABNORMAL HIGH (ref 80.0–100.0)
Platelets: 226 10*3/uL (ref 150–400)
RBC: 3.23 MIL/uL — ABNORMAL LOW (ref 4.22–5.81)
RDW: 14.1 % (ref 11.5–15.5)
WBC: 4.3 10*3/uL (ref 4.0–10.5)
nRBC: 0 % (ref 0.0–0.2)

## 2023-12-10 LAB — BASIC METABOLIC PANEL
Anion gap: 12 (ref 5–15)
BUN: 36 mg/dL — ABNORMAL HIGH (ref 8–23)
CO2: 23 mmol/L (ref 22–32)
Calcium: 8.9 mg/dL (ref 8.9–10.3)
Chloride: 99 mmol/L (ref 98–111)
Creatinine, Ser: 0.94 mg/dL (ref 0.61–1.24)
GFR, Estimated: >60 mL/min (ref >60)
Glucose, Bld: 91 mg/dL (ref 70–99)
Potassium: 3.3 mmol/L — ABNORMAL LOW (ref 3.5–5.1)
Sodium: 134 mmol/L — ABNORMAL LOW (ref 135–145)

## 2023-12-10 LAB — PHOSPHORUS: Phosphorus: 4 mg/dL (ref 2.5–4.6)

## 2023-12-10 LAB — MAGNESIUM: Magnesium: 2 mg/dL (ref 1.7–2.4)

## 2023-12-10 MED ORDER — TRAZODONE HCL 50 MG PO TABS
25.0000 mg | ORAL_TABLET | Freq: Once | ORAL | Status: AC
  Administered 2023-12-10: 25 mg via ORAL
  Filled 2023-12-10: qty 1

## 2023-12-10 MED ORDER — POTASSIUM CHLORIDE CRYS ER 20 MEQ PO TBCR
20.0000 meq | EXTENDED_RELEASE_TABLET | Freq: Once | ORAL | Status: AC
  Administered 2023-12-10: 20 meq via ORAL
  Filled 2023-12-10: qty 1

## 2023-12-10 NOTE — Progress Notes (Signed)
 PROGRESS NOTE    Kyle Montoya  NFA:[id number] DOB: Oct 28, 1950 DOA: 12/02/2023 PCP: Lars Mage, NP   Brief Narrative: 72 year old Male with PMH significant for PAF, HFrEF with EF 55 to 60%, H/o alcohol abuse, COPD and history of lung CA, cholangiocarcinoma and CML was admitted with progressive weakness, sudden onset dyspnea and increased lower extremity edema. Workup was noted for AKI, hyponatremia and metabolic acidosis as well as hypotension. Patient has been treated with fluid resuscitation and with assistance of nephrology and PCCM consultation given hypovolemic hypotension requiring pressors, now discontinued and has done well with improvement in kidney function and sodium.   Assessment & Plan:   Principal Problem:   Hyponatremia Active Problems:   COPD with asthma (HCC)   Gout   HLD (hyperlipidemia)   Ascending aortic aneurysm (HCC)   BPH (benign prostatic hyperplasia)   CAD (coronary artery disease)   Chronic myelogenous leukemia (HCC)   HTN (hypertension)   AKI (acute kidney injury) (HCC)   Hypotension   Alcohol abuse   Hypokalemia   Metabolic acidosis   Shock (HCC)   AKI secondary to ATN >  Now resolved: Hyponatremia--resolved. Hypovolemic shock secondary to overdiuresis--now off pressors--resolved. Patient has been treated with IV fluid resuscitation with resolution of hyponatremia,  hypovolemia and AKI. Of note home medication showed that patient was on furosemide 80 twice daily as well as torsemide 60 daily. In addition he was on a irbesartan, HCTZ which have both been discontinued Continue midodrine 15 mg 3 times daily. Patient should never be placed back on hydrochlorothiazide Creatinine trended up from 1.0-1.4, discontinue torsemide.   Hypokalemia: Continue K-Dur 20 mill equivalents daily.   Alcoholism Patient is doing well after 3 days of lorazepam. Discontinue CIWA protocol.   Decompensated HFrEF Echocardiogram done last week shows EF 65 to 70%  with grade 1 diastolic dysfunction Presently euvolemic Nephrology recommends potential discharge with loop diuretics. However patient has a relatively normal heart and is presently euvolemic If loop diuretic is to be started, will need to be low-dose with close follow-up   Estimated body mass index is 26.75 kg/m as calculated from the following:   Height as of this encounter: 5\' 11"  (1.803 m).   Weight as of this encounter: 87 kg.  DVT prophylaxis: SCDs Code Status: Full code Family Communication: Disposition Plan:    Status is: Inpatient Remains inpatient appropriate because: Awaiting SNF placement     Consultants:  None   Procedures: CT A/P   Antimicrobials:   Subjective:  Await auth  K 3.3 mag normal Objective: Vitals:   12/10/23 0452 12/10/23 0500 12/10/23 0748 12/10/23 1304  BP: 108/72   100/69  Pulse: 69   70  Resp: 15     Temp: 97.8 F (36.6 C)   98.2 F (36.8 C)  TempSrc: Oral   Oral  SpO2: 97%  98% 99%  Weight:  87 kg    Height:        Intake/Output Summary (Last 24 hours) at 12/10/2023 1518 Last data filed at 12/10/2023 1300 Gross per 24 hour  Intake 480 ml  Output 200 ml  Net 280 ml   Filed Weights   12/08/23 0500 12/09/23 0500 12/10/23 0500  Weight: 86 kg 87 kg 87 kg    Examination:  General exam: Appears calm and comfortable  Respiratory system: Clear to auscultation. Respiratory effort normal. Cardiovascular system: S1 & S2 heard, RRR. No JVD, murmurs, rubs, gallops or clicks. No pedal edema. Gastrointestinal system: Abdomen is nondistended, soft  and nontender. No organomegaly or masses felt. Normal bowel sounds heard. Central nervous system: Alert and oriented. No focal neurological deficits. Extremities: . No edema     Data Reviewed: I have personally reviewed following labs and imaging studies  CBC: Recent Labs  Lab 12/05/23 0512 12/06/23 0555 12/10/23 0524  WBC 3.9* 5.4 4.3  HGB 12.7* 13.1 12.1*  HCT 36.8* 37.9* 35.3*   MCV 107.6* 109.2* 109.3*  PLT 230 223 226   Basic Metabolic Panel: Recent Labs  Lab 12/05/23 0512 12/05/23 1639 12/06/23 0555 12/07/23 0501 12/08/23 0419 12/10/23 0524  NA 128* 126* 131* 136 134* 134*  K 3.4* 3.3* 4.0 3.5 3.7 3.3*  CL 87* 85* 91* 94* 93* 99  CO2 26 29 30 29 31 23   GLUCOSE 123* 159* 97 111* 112* 91  BUN 80* 80* 67* 51* 59* 36*  CREATININE 1.17 1.20 1.02 1.02 1.44* 0.94  CALCIUM 9.2 9.6 9.8 10.0 10.0 8.9  MG 2.1  --   --   --   --  2.0  PHOS 3.2  3.2 3.4 3.2 3.9 3.8 4.0   GFR: Estimated Creatinine Clearance: 73.4 mL/min (by C-G formula based on SCr of 0.94 mg/dL). Liver Function Tests: Recent Labs  Lab 12/05/23 0512 12/05/23 1639 12/06/23 0555 12/07/23 0501 12/08/23 0419  ALBUMIN 3.5 3.5 3.5 3.6 3.7   No results for input(s): "LIPASE", "AMYLASE" in the last 168 hours. No results for input(s): "AMMONIA" in the last 168 hours. Coagulation Profile: No results for input(s): "INR", "PROTIME" in the last 168 hours. Cardiac Enzymes: No results for input(s): "CKTOTAL", "CKMB", "CKMBINDEX", "TROPONINI" in the last 168 hours. BNP (last 3 results) No results for input(s): "PROBNP" in the last 8760 hours. HbA1C: No results for input(s): "HGBA1C" in the last 72 hours. CBG: Recent Labs  Lab 12/04/23 0843  GLUCAP 156*   Lipid Profile: No results for input(s): "CHOL", "HDL", "LDLCALC", "TRIG", "CHOLHDL", "LDLDIRECT" in the last 72 hours. Thyroid Function Tests: No results for input(s): "TSH", "T4TOTAL", "FREET4", "T3FREE", "THYROIDAB" in the last 72 hours. Anemia Panel: No results for input(s): "VITAMINB12", "FOLATE", "FERRITIN", "TIBC", "IRON", "RETICCTPCT" in the last 72 hours. Sepsis Labs: No results for input(s): "PROCALCITON", "LATICACIDVEN" in the last 168 hours.  Recent Results (from the past 240 hours)  MRSA Next Gen by PCR, Nasal     Status: None   Collection Time: 12/02/23 12:02 AM   Specimen: Nasal Mucosa; Nasal Swab  Result Value Ref Range  Status   MRSA by PCR Next Gen NOT DETECTED NOT DETECTED Final    Comment: (NOTE) The GeneXpert MRSA Assay (FDA approved for NASAL specimens only), is one component of a comprehensive MRSA colonization surveillance program. It is not intended to diagnose MRSA infection nor to guide or monitor treatment for MRSA infections. Test performance is not FDA approved in patients less than 50 years old. Performed at [facility], [address], [place], [place] [address]   Resp panel by RT-PCR (RSV, Flu A&B, Covid) Anterior Nasal Swab     Status: None   Collection Time: 12/02/23  2:28 AM   Specimen: Anterior Nasal Swab  Result Value Ref Range Status   SARS Coronavirus 2 by RT PCR NEGATIVE NEGATIVE Final    Comment: (NOTE) SARS-CoV-2 target nucleic acids are NOT DETECTED.  The SARS-CoV-2 RNA is generally detectable in upper respiratory specimens during the acute phase of infection. The lowest concentration of SARS-CoV-2 viral copies this assay can detect is 138 copies/mL. A negative result  does not preclude SARS-Cov-2 infection and should not be used as the sole basis for treatment or other patient management decisions. A negative result may occur with  improper specimen collection/handling, submission of specimen other than nasopharyngeal swab, presence of viral mutation(s) within the areas targeted by this assay, and inadequate number of viral copies(<138 copies/mL). A negative result must be combined with clinical observations, patient history, and epidemiological information. The expected result is Negative.  Fact Sheet for Patients:  BloggerCourse.com  Fact Sheet for Healthcare Providers:  SeriousBroker.it  This test is no t yet approved or cleared by the [place] FDA and  has been authorized for detection and/or diagnosis of SARS-CoV-2 by FDA under an Emergency Use Authorization (EUA). This EUA will remain   in effect (meaning this test can be used) for the duration of the COVID-19 declaration under Section 564(b)(1) of the Act, 21 U.S.C.section 360bbb-3(b)(1), unless the authorization is terminated  or revoked sooner.       Influenza A by PCR NEGATIVE NEGATIVE Final   Influenza B by PCR NEGATIVE NEGATIVE Final    Comment: (NOTE) The Xpert Xpress SARS-CoV-2/FLU/RSV plus assay is intended as an aid in the diagnosis of influenza from Nasopharyngeal swab specimens and should not be used as a sole basis for treatment. Nasal washings and aspirates are unacceptable for Xpert Xpress SARS-CoV-2/FLU/RSV testing.  Fact Sheet for Patients: BloggerCourse.com  Fact Sheet for Healthcare Providers: SeriousBroker.it  This test is not yet approved or cleared by the [place] FDA and has been authorized for detection and/or diagnosis of SARS-CoV-2 by FDA under an Emergency Use Authorization (EUA). This EUA will remain in effect (meaning this test can be used) for the duration of the COVID-19 declaration under Section 564(b)(1) of the Act, 21 U.S.C. section 360bbb-3(b)(1), unless the authorization is terminated or revoked.     Resp Syncytial Virus by PCR NEGATIVE NEGATIVE Final    Comment: (NOTE) Fact Sheet for Patients: BloggerCourse.com  Fact Sheet for Healthcare Providers: SeriousBroker.it  This test is not yet approved or cleared by the [place] FDA and has been authorized for detection and/or diagnosis of SARS-CoV-2 by FDA under an Emergency Use Authorization (EUA). This EUA will remain in effect (meaning this test can be used) for the duration of the COVID-19 declaration under Section 564(b)(1) of the Act, 21 U.S.C. section 360bbb-3(b)(1), unless the authorization is terminated or revoked.  Performed at [facility], [address], [place], [place]  [address]      Radiology Studies: No results found.   Scheduled Meds:  arformoterol  15 mcg Nebulization BID   folic acid  1 mg Oral Daily   heparin  5,000 Units Subcutaneous Q8H   midodrine  15 mg Oral TID WC   multivitamin with minerals  1 tablet Oral Daily   potassium chloride  20 mEq Oral Daily   revefenacin  175 mcg Nebulization Daily   thiamine  100 mg Oral Daily   Or   thiamine  100 mg Intravenous Daily   Continuous Infusions:   LOS: 8 days    Time spent: 30 min Alwyn Ren, MD  12/10/2023, 3:18 PM

## 2023-12-10 NOTE — TOC Progression Note (Signed)
 Transition of Care [facility]) - Progression Note    Patient Details  Name: Kyle Montoya MRN: [id number] Date of Birth: [birth year]  Transition of Care [facility]) CM/SW Contact  Yeudiel Mateo, Olegario Messier, RN Phone Number: 12/10/2023, 1:42 PM  Clinical Narrative:[employer] rep Tiffany awaiting auth.       Expected Discharge Plan: Skilled Nursing Facility Barriers to Discharge: Insurance Authorization  Expected Discharge Plan and Services In-house Referral: Clinical Social Work Discharge Planning Services: CM Consult Post Acute Care Choice: Skilled Nursing Facility Living arrangements for the past 2 months: Single Family Home                 DME Arranged: N/A DME Agency: NA                   Social Determinants of Health (SDOH) Interventions SDOH Screenings   Food Insecurity: No Food Insecurity (12/02/2023)  Housing: Low Risk  (12/02/2023)  Transportation Needs: No Transportation Needs (12/02/2023)  Utilities: Not At Risk (12/02/2023)  Social Connections: Moderately Isolated (12/02/2023)  Tobacco Use: Medium Risk (12/02/2023)    Readmission Risk Interventions    12/08/2023   10:22 AM 12/03/2023   10:44 AM 10/09/2023    3:26 PM  Readmission Risk Prevention Plan  Transportation Screening  Complete Complete  PCP or Specialist Appt within 5-7 Days   Complete  PCP or Specialist Appt within 3-5 Days Complete    Home Care Screening   Complete  Medication Review (RN CM)   Complete  HRI or Home Care Consult Complete Complete   Social Work Consult for Recovery Care Planning/Counseling  Complete   Palliative Care Screening Complete Not Applicable   Medication Review Oceanographer) Complete Complete

## 2023-12-10 NOTE — Progress Notes (Signed)
 Mobility Specialist - Progress Note   12/10/23 0955  Mobility  Activity Ambulated with assistance in hallway  Level of Assistance Standby assist, set-up cues, supervision of patient - no hands on  Assistive Device Front wheel walker  Distance Ambulated (ft) 80 ft  Activity Response Tolerated well  Mobility Referral Yes  Mobility visit 1 Mobility  Mobility Specialist Start Time (ACUTE ONLY) 0946  Mobility Specialist Stop Time (ACUTE ONLY) 0955  Mobility Specialist Time Calculation (min) (ACUTE ONLY) 9 min   Pt received in bed and agreeable to mobility. MinA from STS & getting back into bed. No complaints during session. Pt to bed after session with all needs met. Bed alarm on.    [facility]

## 2023-12-11 DIAGNOSIS — E871 Hypo-osmolality and hyponatremia: Secondary | ICD-10-CM | POA: Diagnosis not present

## 2023-12-11 LAB — BASIC METABOLIC PANEL WITH GFR
Anion gap: 10 (ref 5–15)
BUN: 27 mg/dL — ABNORMAL HIGH (ref 8–23)
CO2: 23 mmol/L (ref 22–32)
Calcium: 9.1 mg/dL (ref 8.9–10.3)
Chloride: 101 mmol/L (ref 98–111)
Creatinine, Ser: 0.86 mg/dL (ref 0.61–1.24)
GFR, Estimated: >60 mL/min
Glucose, Bld: 109 mg/dL — ABNORMAL HIGH (ref 70–99)
Potassium: 3.7 mmol/L (ref 3.5–5.1)
Sodium: 134 mmol/L — ABNORMAL LOW (ref 135–145)

## 2023-12-11 LAB — MAGNESIUM: Magnesium: 2.1 mg/dL (ref 1.7–2.4)

## 2023-12-11 MED ORDER — ADULT MULTIVITAMIN W/MINERALS CH: 1.0000 | ORAL_TABLET | Freq: Every day | ORAL | Status: DC

## 2023-12-11 MED ORDER — VITAMIN B-1 100 MG PO TABS: 100.0000 mg | ORAL_TABLET | Freq: Every day | ORAL | Status: DC

## 2023-12-11 MED ORDER — FOLIC ACID 1 MG PO TABS: 1.0000 mg | ORAL_TABLET | Freq: Every day | ORAL | Status: DC

## 2023-12-11 MED ORDER — MIDODRINE HCL 5 MG PO TABS: 15.0000 mg | ORAL_TABLET | Freq: Three times a day (TID) | ORAL | 1 refills | Status: DC

## 2023-12-11 MED ORDER — POLYETHYLENE GLYCOL 3350 17 G PO PACK: 17.0000 g | PACK | Freq: Every day | ORAL | 0 refills | Status: DC | PRN

## 2023-12-11 MED ORDER — POTASSIUM CHLORIDE CRYS ER 20 MEQ PO TBCR: 40.0000 meq | EXTENDED_RELEASE_TABLET | Freq: Every day | ORAL | 0 refills | Status: DC

## 2023-12-11 NOTE — Progress Notes (Signed)
 Patient unable to rest during the evening shift. Up and down. Upset and evening crying at times. Patient states that he does not want to go the rehab facility and wants to go home. If available he would prefer rehab at home. Patient complaining of bed alarm constantly going off that is driving him crazy and that he is unable to eat the hospital food, and would like to go home and fix his own food. Patient is sitting on the side of bed waiting for daughter to bring him breakfast.

## 2023-12-11 NOTE — Discharge Summary (Addendum)
 Physician Discharge Summary  Kyle Montoya ZOX:[id number] DOB: Mar 27, 1951 DOA: 12/02/2023  PCP: Lars Mage, NP  Admit date: 12/02/2023 Discharge date: 12/11/2023  Admitted From: Home Disposition: Home  recommendations for Outpatient Follow-up:  Follow up with PCP in 1-2 weeks Please obtain BMP/CBC in one week  Home Health: Yes, he was initially recommended to go to rehab however he refused Equipment/Devices: None  Discharge Condition: Stable CODE STATUS: Full code Diet recommendation: Cardiac Brief/Interim Summary: 73 year old Male with PMH significant for PAF, HFrEF with EF 55 to 60%, H/o alcohol abuse, COPD and history of lung CA, cholangiocarcinoma and CML was admitted with progressive weakness, sudden onset dyspnea and increased lower extremity edema. Workup was noted for AKI, hyponatremia and metabolic acidosis as well as hypotension. Patient has been treated with fluid resuscitation and with assistance of nephrology and PCCM consultation given hypovolemic hypotension requiring pressors, now discontinued and has done well with improvement in kidney function and sodium.   Discharge Diagnoses:  Principal Problem:   Hyponatremia Active Problems:   COPD with asthma (HCC)   Gout   HLD (hyperlipidemia)   Ascending aortic aneurysm (HCC)   BPH (benign prostatic hyperplasia)   CAD (coronary artery disease)   Chronic myelogenous leukemia (HCC)   HTN (hypertension)   AKI (acute kidney injury) (HCC)   Hypotension   Alcohol abuse   Hypokalemia   Metabolic acidosis   Shock (HCC)    AKI secondary to ATN > resolved with IV fluids  Hyponatremia--resolved. Hypovolemic shock secondary to overdiuresis--now off pressors Patient has been treated with IV fluid resuscitation with resolution of hyponatremia,  hypovolemia and AKI. Of note home medication showed that patient was on furosemide 80 twice daily as well as torsemide 60 daily. In addition he was on a irbesartan, HCTZ which have  both been discontinued At the time of discharge hydrochlorothiazide irbesartan torsemide and Lasix were stopped.  He was treated with midodrine 15 mg 3 times a day to keep his blood pressure normal.  Hypokalemia: 3.7 on discharge he does not need to continue any potassium pills at home since he is not on diuretics at the time of discharge.   Alcoholism he was treated with CIWA protocol encouraged to quit alcohol use    Diastolic heart failure diuretics stopped on discharge due to severe hypotension during the hospital stay please follow-up with PCP you may need to restart this medications at home in the future. Estimated body mass index is 27.03 kg/m as calculated from the following:   Height as of this encounter: 5\' 11"  (1.803 m).   Weight as of this encounter: 87.9 kg.  Discharge Instructions  Discharge Instructions     Diet - low sodium heart healthy   Complete by: As directed    Increase activity slowly   Complete by: As directed       Allergies as of 12/11/2023       Reactions   Pneumococcal Vaccines Swelling        Medication List     STOP taking these medications    furosemide 80 MG tablet Commonly known as: LASIX   irbesartan-hydrochlorothiazide 150-12.5 MG tablet Commonly known as: AVALIDE   torsemide 20 MG tablet Commonly known as: DEMADEX       TAKE these medications    albuterol 108 (90 Base) MCG/ACT inhaler Commonly known as: VENTOLIN HFA Inhale 2 puffs into the lungs every 6 (six) hours as needed for wheezing or shortness of breath.   allopurinol 300 MG tablet Commonly  known as: ZYLOPRIM Take 300 mg by mouth daily.   aspirin EC 81 MG tablet Take 1 tablet (81 mg total) by mouth daily. Swallow whole.   CALCIUM 500 PO Take 1 tablet by mouth 3 (three) times daily.   diclofenac Sodium 1 % Gel Commonly known as: VOLTAREN Apply 2 g topically at bedtime as needed (Pain).   fluticasone 50 MCG/ACT nasal spray Commonly known as: FLONASE Place 2  sprays into both nostrils daily.   folic acid 1 MG tablet Commonly known as: FOLVITE Take 1 tablet (1 mg total) by mouth daily. Start taking on: December 12, 2023   HYDROcodone-acetaminophen 5-325 MG tablet Commonly known as: NORCO/VICODIN Take 1 tablet by mouth 4 (four) times daily as needed for moderate pain (pain score 4-6).   methocarbamol 750 MG tablet Commonly known as: ROBAXIN Take 750 mg by mouth every 6 (six) hours as needed for muscle spasms.   midodrine 5 MG tablet Commonly known as: PROAMATINE Take 3 tablets (15 mg total) by mouth 3 (three) times daily with meals.   multivitamin with minerals Tabs tablet Take 1 tablet by mouth daily. Start taking on: December 12, 2023   omeprazole 40 MG capsule Commonly known as: PRILOSEC TAKE 1 CAPSULE (40 MG TOTAL) BY MOUTH 2 (TWO) TIMES DAILY BEFORE A MEAL.   polyethylene glycol 17 g packet Commonly known as: MIRALAX / GLYCOLAX Take 17 g by mouth daily as needed for moderate constipation.   potassium chloride SA 20 MEQ tablet Commonly known as: KLOR-CON M Take 2 tablets (40 mEq total) by mouth daily. Start taking on: December 12, 2023 What changed:  medication strength how much to take   Sprycel 100 MG tablet Generic drug: dasatinib TAKE 1 TABLET BY MOUTH DAILY   tamsulosin 0.4 MG Caps capsule Commonly known as: FLOMAX Take 0.8 mg by mouth daily.   thiamine 100 MG tablet Commonly known as: Vitamin B-1 Take 1 tablet (100 mg total) by mouth daily. Start taking on: December 12, 2023   Trelegy Ellipta 100-62.5-25 MCG/INH Aepb Generic drug: Fluticasone-Umeclidin-Vilant Inhale 1 puff into the lungs daily at 6 (six) AM.   Vitamin D 50 MCG (2000 UT) tablet Take 2,000 Units by mouth daily.        Contact information for follow-up providers     [facility] Follow up.   Why: HH physical/occupational therapy Contact information: [address] [place] [address] [phone]        Tetter, Devin B, NP  Follow up.   Specialty: Nurse Practitioner Contact information: [address] [place] [address] [phone]              Contact information for after-discharge care     Destination     [facility] Preferred SNF .   Service: Skilled Nursing Contact information: [address] [place] [address] [phone]                    Allergies  Allergen Reactions   Pneumococcal Vaccines Swelling    Consultations: none   Procedures/Studies: DG Lumbar Spine 2-3 Views Result Date: 12/02/2023 CLINICAL DATA:  Low back pain. EXAM: LUMBAR SPINE - 2-3 VIEW COMPARISON:  07/17/2023. FINDINGS: There is no evidence of acute lumbar spine fracture. Alignment is normal. Multilevel intervertebral disc space narrowing, degenerative endplate changes, and facet arthropathy are noted. IMPRESSION: 1. No acute fracture. 2. Multilevel degenerative changes. Electronically Signed   By: Charlestine Night.D.  On: 12/02/2023 22:11   ECHOCARDIOGRAM COMPLETE Result Date: 12/02/2023    ECHOCARDIOGRAM REPORT   Patient Name:   Kyle Montoya Date of Exam: 12/02/2023 Medical Rec #:  [id number]      Height:       69.0 in Accession #:    [id number]     Weight:       208.2 lb Date of Birth:  [birth year]      BSA:          2.102 m Patient Age:    73 years       BP:           92/60 mmHg Patient Gender: M              HR:           71 bpm. Exam Location:  Inpatient Procedure: 2D Echo, Color Doppler and Cardiac Doppler (Both Spectral and Color            Flow Doppler were utilized during procedure). Indications:    CHF Acute Diastolic I50.31  History:        Patient has no prior history of Echocardiogram examinations.                 CHF, CAD, COPD; Risk Factors:Dyslipidemia and Hypertension.  Sonographer:    Harriette Bouillon RDCS Referring Phys: [phone] DAVID MANUEL ORTIZ IMPRESSIONS  1. Left ventricular ejection fraction, by estimation, is 65 to 70%. The left  ventricle has normal function. The left ventricle has no regional wall motion abnormalities. Left ventricular diastolic parameters are consistent with Grade I diastolic dysfunction (impaired relaxation).  2. Right ventricular systolic function is normal. The right ventricular size is normal.  3. The mitral valve is normal in structure. No evidence of mitral valve regurgitation. No evidence of mitral stenosis.  4. The aortic valve is tricuspid. There is mild calcification of the aortic valve. Aortic valve regurgitation is not visualized. No aortic stenosis is present.  5. The inferior vena cava is normal in size with greater than 50% respiratory variability, suggesting right atrial pressure of 3 mmHg. FINDINGS  Left Ventricle: Left ventricular ejection fraction, by estimation, is 65 to 70%. The left ventricle has normal function. The left ventricle has no regional wall motion abnormalities. The left ventricular internal cavity size was normal in size. There is  no left ventricular hypertrophy. Left ventricular diastolic parameters are consistent with Grade I diastolic dysfunction (impaired relaxation). Right Ventricle: The right ventricular size is normal. No increase in right ventricular wall thickness. Right ventricular systolic function is normal. Left Atrium: Left atrial size was normal in size. Right Atrium: Right atrial size was normal in size. Pericardium: There is no evidence of pericardial effusion. Mitral Valve: The mitral valve is normal in structure. There is mild calcification of the mitral valve leaflet(s). No evidence of mitral valve regurgitation. No evidence of mitral valve stenosis. Tricuspid Valve: The tricuspid valve is normal in structure. Tricuspid valve regurgitation is not demonstrated. No evidence of tricuspid stenosis. Aortic Valve: The aortic valve is tricuspid. There is mild calcification of the aortic valve. Aortic valve regurgitation is not visualized. No aortic stenosis is present.  Pulmonic Valve: The pulmonic valve was normal in structure. Pulmonic valve regurgitation is trivial. No evidence of pulmonic stenosis. Aorta: The aortic root is normal in size and structure. Venous: The inferior vena cava is normal in size with greater than 50% respiratory variability, suggesting right atrial pressure of 3 mmHg. IAS/Shunts: No atrial level  shunt detected by color flow Doppler.  LEFT VENTRICLE PLAX 2D LVIDd:         4.80 cm   Diastology LVIDs:         3.10 cm   LV e' medial:    5.66 cm/s LV PW:         1.20 cm   LV E/e' medial:  13.7 LV IVS:        1.00 cm   LV e' lateral:   6.53 cm/s LVOT diam:     1.90 cm   LV E/e' lateral: 11.9 LV SV:         56 LV SV Index:   27 LVOT Area:     2.84 cm  LEFT ATRIUM             Index LA diam:        2.90 cm 1.38 cm/m LA Vol (A2C):   41.1 ml 19.56 ml/m LA Vol (A4C):   44.7 ml 21.27 ml/m LA Biplane Vol: 43.7 ml 20.79 ml/m  AORTIC VALVE LVOT Vmax:   114.00 cm/s LVOT Vmean:  83.700 cm/s LVOT VTI:    0.199 m  AORTA Ao Root diam: 3.70 cm Ao Asc diam:  3.70 cm MITRAL VALVE MV Area (PHT): 2.82 cm    SHUNTS MV E velocity: 77.80 cm/s  Systemic VTI:  0.20 m MV A velocity: 84.60 cm/s  Systemic Diam: 1.90 cm MV E/A ratio:  0.92 Arvilla Meres MD Electronically signed by Arvilla Meres MD Signature Date/Time: 12/02/2023/1:28:06 PM    Final    US RENAL Result Date: 12/02/2023 CLINICAL DATA:  Acute kidney injury. EXAM: RENAL / URINARY TRACT ULTRASOUND COMPLETE COMPARISON:  CT scan abdomen and pelvis from 07/17/2023. FINDINGS: Right Kidney: Renal measurements: 5.2 x 5.3 x 11.0 cm. = volume: 160.0 mL. Echogenicity within normal limits. No mass or hydronephrosis visualized. Left Kidney: Renal measurements: 5.7 x 6.1 x 10.8 cm. = volume: 195.4 mL. There is diffuse increased cortical echogenicity, nonspecific but commonly seen with medical renal disease. No mass or hydronephrosis. Bladder: Appears normal for degree of bladder distention. Other: None. IMPRESSION: *Diffuse  increased cortical echogenicity of the left kidney, nonspecific but commonly seen with medical renal disease. Otherwise unremarkable exam. No hydronephrosis. Electronically Signed   By: Jules Schick M.D.   On: 12/02/2023 12:52   DG Chest 2 View Result Date: 12/02/2023 CLINICAL DATA:  Recent pneumonia EXAM: CHEST - 2 VIEW COMPARISON:  10/21/2023 FINDINGS: Mild residual right basilar opacity, with improved aeration since 10/21/2023. Unchanged small right pleural effusion. Normal cardiomediastinal contours. IMPRESSION: 1. Mild residual right basilar opacity, with improved aeration since 10/21/2023. 2. Unchanged small right pleural effusion. Electronically Signed   By: Deatra Robinson M.D.   On: 12/02/2023 04:05   (Echo, Carotid, EGD, Colonoscopy, ERCP)    Subjective: Anxious to go home  Discharge Exam: Vitals:   12/11/23 0935 12/11/23 0936  BP:    Pulse:    Resp:    Temp:    SpO2: 99% 98%   Vitals:   12/11/23 0526 12/11/23 0931 12/11/23 0935 12/11/23 0936  BP: 116/83     Pulse: 78 78    Resp: 16 17    Temp: 98.1 F (36.7 C)     TempSrc: Oral     SpO2: 99% 98% 99% 98%  Weight:      Height:        General: Pt is alert, awake, not in acute distress Cardiovascular: RRR, S1/S2 +, no rubs, no  gallops Respiratory: CTA bilaterally, no wheezing, no rhonchi Abdominal: Soft, NT, ND, bowel sounds + Extremities: no edema, no cyanosis    The results of significant diagnostics from this hospitalization (including imaging, microbiology, ancillary and laboratory) are listed below for reference.     Microbiology: Recent Results (from the past 240 hours)  MRSA Next Gen by PCR, Nasal     Status: None   Collection Time: 12/02/23 12:02 AM   Specimen: Nasal Mucosa; Nasal Swab  Result Value Ref Range Status   MRSA by PCR Next Gen NOT DETECTED NOT DETECTED Final    Comment: (NOTE) The GeneXpert MRSA Assay (FDA approved for NASAL specimens only), is one component of a comprehensive MRSA  colonization surveillance program. It is not intended to diagnose MRSA infection nor to guide or monitor treatment for MRSA infections. Test performance is not FDA approved in patients less than 25 years old. Performed at [facility], [address], [place], [place] [address]   Resp panel by RT-PCR (RSV, Flu A&B, Covid) Anterior Nasal Swab     Status: None   Collection Time: 12/02/23  2:28 AM   Specimen: Anterior Nasal Swab  Result Value Ref Range Status   SARS Coronavirus 2 by RT PCR NEGATIVE NEGATIVE Final    Comment: (NOTE) SARS-CoV-2 target nucleic acids are NOT DETECTED.  The SARS-CoV-2 RNA is generally detectable in upper respiratory specimens during the acute phase of infection. The lowest concentration of SARS-CoV-2 viral copies this assay can detect is 138 copies/mL. A negative result does not preclude SARS-Cov-2 infection and should not be used as the sole basis for treatment or other patient management decisions. A negative result may occur with  improper specimen collection/handling, submission of specimen other than nasopharyngeal swab, presence of viral mutation(s) within the areas targeted by this assay, and inadequate number of viral copies(<138 copies/mL). A negative result must be combined with clinical observations, patient history, and epidemiological information. The expected result is Negative.  Fact Sheet for Patients:  BloggerCourse.com  Fact Sheet for Healthcare Providers:  SeriousBroker.it  This test is no t yet approved or cleared by the [place] FDA and  has been authorized for detection and/or diagnosis of SARS-CoV-2 by FDA under an Emergency Use Authorization (EUA). This EUA will remain  in effect (meaning this test can be used) for the duration of the COVID-19 declaration under Section 564(b)(1) of the Act, 21 U.S.C.section 360bbb-3(b)(1), unless the authorization is  terminated  or revoked sooner.       Influenza A by PCR NEGATIVE NEGATIVE Final   Influenza B by PCR NEGATIVE NEGATIVE Final    Comment: (NOTE) The Xpert Xpress SARS-CoV-2/FLU/RSV plus assay is intended as an aid in the diagnosis of influenza from Nasopharyngeal swab specimens and should not be used as a sole basis for treatment. Nasal washings and aspirates are unacceptable for Xpert Xpress SARS-CoV-2/FLU/RSV testing.  Fact Sheet for Patients: BloggerCourse.com  Fact Sheet for Healthcare Providers: SeriousBroker.it  This test is not yet approved or cleared by the [place] FDA and has been authorized for detection and/or diagnosis of SARS-CoV-2 by FDA under an Emergency Use Authorization (EUA). This EUA will remain in effect (meaning this test can be used) for the duration of the COVID-19 declaration under Section 564(b)(1) of the Act, 21 U.S.C. section 360bbb-3(b)(1), unless the authorization is terminated or revoked.     Resp Syncytial Virus by PCR NEGATIVE NEGATIVE Final    Comment: (NOTE) Fact Sheet for Patients: BloggerCourse.com  Fact Sheet for Healthcare Providers: SeriousBroker.it  This test is not yet approved or cleared by the [place] FDA and has been authorized for detection and/or diagnosis of SARS-CoV-2 by FDA under an Emergency Use Authorization (EUA). This EUA will remain in effect (meaning this test can be used) for the duration of the COVID-19 declaration under Section 564(b)(1) of the Act, 21 U.S.C. section 360bbb-3(b)(1), unless the authorization is terminated or revoked.  Performed at [facility], [address] [place], [place] [address]      Labs: BNP (last 3 results) Recent Labs    10/08/23 0722 12/02/23 0219 12/04/23 0037  BNP 95.0 182.4* 319.4*   Basic Metabolic Panel: Recent Labs  Lab 12/05/23 0512  12/05/23 1639 12/06/23 0555 12/07/23 0501 12/08/23 0419 12/10/23 0524 12/11/23 0954  NA 128* 126* 131* 136 134* 134* 134*  K 3.4* 3.3* 4.0 3.5 3.7 3.3* 3.7  CL 87* 85* 91* 94* 93* 99 101  CO2 26 29 30 29 31 23 23   GLUCOSE 123* 159* 97 111* 112* 91 109*  BUN 80* 80* 67* 51* 59* 36* 27*  CREATININE 1.17 1.20 1.02 1.02 1.44* 0.94 0.86  CALCIUM 9.2 9.6 9.8 10.0 10.0 8.9 9.1  MG 2.1  --   --   --   --  2.0 2.1  PHOS 3.2  3.2 3.4 3.2 3.9 3.8 4.0  --    Liver Function Tests: Recent Labs  Lab 12/05/23 0512 12/05/23 1639 12/06/23 0555 12/07/23 0501 12/08/23 0419  ALBUMIN 3.5 3.5 3.5 3.6 3.7   No results for input(s): "LIPASE", "AMYLASE" in the last 168 hours. No results for input(s): "AMMONIA" in the last 168 hours. CBC: Recent Labs  Lab 12/05/23 0512 12/06/23 0555 12/10/23 0524  WBC 3.9* 5.4 4.3  HGB 12.7* 13.1 12.1*  HCT 36.8* 37.9* 35.3*  MCV 107.6* 109.2* 109.3*  PLT 230 223 226   Cardiac Enzymes: No results for input(s): "CKTOTAL", "CKMB", "CKMBINDEX", "TROPONINI" in the last 168 hours. BNP: Invalid input(s): "POCBNP" CBG: No results for input(s): "GLUCAP" in the last 168 hours. D-Dimer No results for input(s): "DDIMER" in the last 72 hours. Hgb A1c No results for input(s): "HGBA1C" in the last 72 hours. Lipid Profile No results for input(s): "CHOL", "HDL", "LDLCALC", "TRIG", "CHOLHDL", "LDLDIRECT" in the last 72 hours. Thyroid function studies No results for input(s): "TSH", "T4TOTAL", "T3FREE", "THYROIDAB" in the last 72 hours.  Invalid input(s): "FREET3" Anemia work up No results for input(s): "VITAMINB12", "FOLATE", "FERRITIN", "TIBC", "IRON", "RETICCTPCT" in the last 72 hours. Urinalysis    Component Value Date/Time   COLORURINE YELLOW 12/02/2023 0344   APPEARANCEUR CLEAR 12/02/2023 0344   LABSPEC 1.012 12/02/2023 0344   PHURINE 6.0 12/02/2023 0344   GLUCOSEU NEGATIVE 12/02/2023 0344   HGBUR MODERATE (A) 12/02/2023 0344   BILIRUBINUR NEGATIVE  12/02/2023 0344   KETONESUR 5 (A) 12/02/2023 0344   PROTEINUR 100 (A) 12/02/2023 0344   NITRITE NEGATIVE 12/02/2023 0344   LEUKOCYTESUR NEGATIVE 12/02/2023 0344   Sepsis Labs Recent Labs  Lab 12/05/23 0512 12/06/23 0555 12/10/23 0524  WBC 3.9* 5.4 4.3   Microbiology Recent Results (from the past 240 hours)  MRSA Next Gen by PCR, Nasal     Status: None   Collection Time: 12/02/23 12:02 AM   Specimen: Nasal Mucosa; Nasal Swab  Result Value Ref Range Status   MRSA by PCR Next Gen NOT DETECTED NOT DETECTED Final    Comment: (NOTE) The GeneXpert MRSA Assay (FDA approved for NASAL specimens  only), is one component of a comprehensive MRSA colonization surveillance program. It is not intended to diagnose MRSA infection nor to guide or monitor treatment for MRSA infections. Test performance is not FDA approved in patients less than 50 years old. Performed at [facility], [address], [place], [place] [address]   Resp panel by RT-PCR (RSV, Flu A&B, Covid) Anterior Nasal Swab     Status: None   Collection Time: 12/02/23  2:28 AM   Specimen: Anterior Nasal Swab  Result Value Ref Range Status   SARS Coronavirus 2 by RT PCR NEGATIVE NEGATIVE Final    Comment: (NOTE) SARS-CoV-2 target nucleic acids are NOT DETECTED.  The SARS-CoV-2 RNA is generally detectable in upper respiratory specimens during the acute phase of infection. The lowest concentration of SARS-CoV-2 viral copies this assay can detect is 138 copies/mL. A negative result does not preclude SARS-Cov-2 infection and should not be used as the sole basis for treatment or other patient management decisions. A negative result may occur with  improper specimen collection/handling, submission of specimen other than nasopharyngeal swab, presence of viral mutation(s) within the areas targeted by this assay, and inadequate number of viral copies(<138 copies/mL). A negative result must be combined with clinical  observations, patient history, and epidemiological information. The expected result is Negative.  Fact Sheet for Patients:  BloggerCourse.com  Fact Sheet for Healthcare Providers:  SeriousBroker.it  This test is no t yet approved or cleared by the [place] FDA and  has been authorized for detection and/or diagnosis of SARS-CoV-2 by FDA under an Emergency Use Authorization (EUA). This EUA will remain  in effect (meaning this test can be used) for the duration of the COVID-19 declaration under Section 564(b)(1) of the Act, 21 U.S.C.section 360bbb-3(b)(1), unless the authorization is terminated  or revoked sooner.       Influenza A by PCR NEGATIVE NEGATIVE Final   Influenza B by PCR NEGATIVE NEGATIVE Final    Comment: (NOTE) The Xpert Xpress SARS-CoV-2/FLU/RSV plus assay is intended as an aid in the diagnosis of influenza from Nasopharyngeal swab specimens and should not be used as a sole basis for treatment. Nasal washings and aspirates are unacceptable for Xpert Xpress SARS-CoV-2/FLU/RSV testing.  Fact Sheet for Patients: BloggerCourse.com  Fact Sheet for Healthcare Providers: SeriousBroker.it  This test is not yet approved or cleared by the [place] FDA and has been authorized for detection and/or diagnosis of SARS-CoV-2 by FDA under an Emergency Use Authorization (EUA). This EUA will remain in effect (meaning this test can be used) for the duration of the COVID-19 declaration under Section 564(b)(1) of the Act, 21 U.S.C. section 360bbb-3(b)(1), unless the authorization is terminated or revoked.     Resp Syncytial Virus by PCR NEGATIVE NEGATIVE Final    Comment: (NOTE) Fact Sheet for Patients: BloggerCourse.com  Fact Sheet for Healthcare Providers: SeriousBroker.it  This test is not yet approved or cleared by  the [place] FDA and has been authorized for detection and/or diagnosis of SARS-CoV-2 by FDA under an Emergency Use Authorization (EUA). This EUA will remain in effect (meaning this test can be used) for the duration of the COVID-19 declaration under Section 564(b)(1) of the Act, 21 U.S.C. section 360bbb-3(b)(1), unless the authorization is terminated or revoked.  Performed at [facility], [address], [place], [place] [address]      Time coordinating discharge:36 min SIGNED:  Alwyn Ren, MD  [facility] 12/11/2023, 3:03 PM

## 2023-12-11 NOTE — Progress Notes (Signed)
 DC orders received and instructions reviewed with pt. Medication education provided. Pt verbalized understanding. Labs drawn prior to dc as ordered however pt refused to await results prior to leaving. MD aware. Family transporting pt home.

## 2023-12-11 NOTE — TOC Transition Note (Signed)
 Transition of Care [facility]) - Discharge Note   Patient Details  Name: Kyle Montoya MRN: [id number] Date of Birth: 10-Nov-1950  Transition of Care [facility]) CM/SW Contact:  Lanier Clam, RN Phone Number: 12/11/2023, 9:32 AM   Clinical Narrative:  Patient/dtr decline [facility] SNF(d/t insurance not in network)-want home I have placed HHPT/OT order. Dr. Ashley Royalty please place face to face order. He already has a rw he says. He is really ready to go.He has family to pick him up. -9:32a [facility] rep Alvino Chapel accepted [facility] tomorrow for HHPT/OT. MD updated.No further CM needs.      Final next level of care: Home w Home Health Services Barriers to Discharge: No Barriers Identified   Patient Goals and CMS Choice Patient states their goals for this hospitalization and ongoing recovery are:: Home CMS Medicare.gov Compare Post Acute Care list provided to:: Patient Choice offered to / list presented to : Patient [place] ownership interest in [facility].provided to:: Patient    Discharge Placement                       Discharge Plan and Services Additional resources added to the After Visit Summary for   In-house Referral: Clinical Social Work Discharge Planning Services: CM Consult Post Acute Care Choice: Home Health          DME Arranged: N/A DME Agency: NA                  Social Drivers of Health (SDOH) Interventions SDOH Screenings   Food Insecurity: No Food Insecurity (12/02/2023)  Housing: Low Risk  (12/02/2023)  Transportation Needs: No Transportation Needs (12/02/2023)  Utilities: Not At Risk (12/02/2023)  Social Connections: Moderately Isolated (12/02/2023)  Tobacco Use: Medium Risk (12/02/2023)     Readmission Risk Interventions    12/08/2023   10:22 AM 12/03/2023   10:44 AM 10/09/2023    3:26 PM  Readmission Risk Prevention Plan  Transportation Screening  Complete Complete  PCP or Specialist Appt within 5-7 Days   Complete  PCP or Specialist Appt within  3-5 Days Complete    Home Care Screening   Complete  Medication Review (RN CM)   Complete  HRI or Home Care Consult Complete Complete   Social Work Consult for Recovery Care Planning/Counseling  Complete   Palliative Care Screening Complete Not Applicable   Medication Review Oceanographer) Complete Complete

## 2023-12-25 ENCOUNTER — Ambulatory Visit: Payer: BC Managed Care – PPO | Attending: Cardiology | Admitting: Cardiology

## 2024-01-13 ENCOUNTER — Other Ambulatory Visit: Payer: Self-pay

## 2024-01-13 DIAGNOSIS — C9210 Chronic myeloid leukemia, BCR/ABL-positive, not having achieved remission: Secondary | ICD-10-CM

## 2024-01-13 MED ORDER — DASATINIB 100 MG PO TABS: 100.0000 mg | ORAL_TABLET | Freq: Every day | ORAL | 5 refills | Status: DC

## 2024-01-14 ENCOUNTER — Other Ambulatory Visit (HOSPITAL_COMMUNITY): Payer: Self-pay

## 2024-01-19 ENCOUNTER — Inpatient Hospital Stay: Payer: BC Managed Care – PPO

## 2024-01-20 ENCOUNTER — Inpatient Hospital Stay: Attending: Hematology and Oncology

## 2024-01-20 DIAGNOSIS — C9210 Chronic myeloid leukemia, BCR/ABL-positive, not having achieved remission: Secondary | ICD-10-CM | POA: Diagnosis present

## 2024-01-20 DIAGNOSIS — E538 Deficiency of other specified B group vitamins: Secondary | ICD-10-CM | POA: Insufficient documentation

## 2024-01-20 DIAGNOSIS — Z8505 Personal history of malignant neoplasm of liver: Secondary | ICD-10-CM | POA: Diagnosis not present

## 2024-01-20 DIAGNOSIS — R978 Other abnormal tumor markers: Secondary | ICD-10-CM | POA: Insufficient documentation

## 2024-01-20 DIAGNOSIS — Z85118 Personal history of other malignant neoplasm of bronchus and lung: Secondary | ICD-10-CM | POA: Diagnosis not present

## 2024-01-20 LAB — CBC WITH DIFFERENTIAL (CANCER CENTER ONLY)
Abs Immature Granulocytes: 0.01 10*3/uL (ref 0.00–0.07)
Basophils Absolute: 0 10*3/uL (ref 0.0–0.1)
Basophils Relative: 1 %
Eosinophils Absolute: 0.1 10*3/uL (ref 0.0–0.5)
Eosinophils Relative: 3 %
HCT: 35.8 % — ABNORMAL LOW (ref 39.0–52.0)
Hemoglobin: 13 g/dL (ref 13.0–17.0)
Immature Granulocytes: 0 %
Lymphocytes Relative: 17 %
Lymphs Abs: 0.7 10*3/uL (ref 0.7–4.0)
MCH: 36.3 pg — ABNORMAL HIGH (ref 26.0–34.0)
MCHC: 36.3 g/dL — ABNORMAL HIGH (ref 30.0–36.0)
MCV: 100 fL (ref 80.0–100.0)
Monocytes Absolute: 0.7 10*3/uL (ref 0.1–1.0)
Monocytes Relative: 18 %
Neutro Abs: 2.3 10*3/uL (ref 1.7–7.7)
Neutrophils Relative %: 61 %
Platelet Count: 309 10*3/uL (ref 150–400)
RBC: 3.58 MIL/uL — ABNORMAL LOW (ref 4.22–5.81)
RDW: 14.7 % (ref 11.5–15.5)
WBC Count: 3.8 10*3/uL — ABNORMAL LOW (ref 4.0–10.5)
nRBC: 0 % (ref 0.0–0.2)
nRBC: 0 /100{WBCs}

## 2024-01-20 LAB — CMP (CANCER CENTER ONLY)
ALT: 11 U/L (ref 0–44)
AST: 33 U/L (ref 15–41)
Albumin: 3.4 g/dL — ABNORMAL LOW (ref 3.5–5.0)
Alkaline Phosphatase: 85 U/L (ref 38–126)
Anion gap: 12 (ref 5–15)
BUN: 13 mg/dL (ref 8–23)
CO2: 23 mmol/L (ref 22–32)
Calcium: 8.6 mg/dL — ABNORMAL LOW (ref 8.9–10.3)
Chloride: 98 mmol/L (ref 98–111)
Creatinine: 0.84 mg/dL (ref 0.61–1.24)
GFR, Estimated: >60 mL/min (ref >60)
Glucose, Bld: 89 mg/dL (ref 70–99)
Potassium: 3.8 mmol/L (ref 3.5–5.1)
Sodium: 132 mmol/L — ABNORMAL LOW (ref 135–145)
Total Bilirubin: 0.5 mg/dL (ref 0.0–1.2)
Total Protein: 6.5 g/dL (ref 6.5–8.1)

## 2024-01-20 LAB — VITAMIN B12: Vitamin B-12: 374 pg/mL (ref 180–914)

## 2024-01-20 LAB — CEA (ACCESS): CEA (CHCC): 1.11 ng/mL (ref 0.00–5.00)

## 2024-01-21 LAB — CANCER ANTIGEN 19-9: CA 19-9: 21 U/mL (ref 0–35)

## 2024-01-22 NOTE — Progress Notes (Incomplete)
 [facility]  [address] [place],  [place]  [address] [phone]  Clinic Day: 01/22/24   Referring physician: Teofilo Fellers, NP  ASSESSMENT & PLAN:  Assessment:   1. 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  He will continue to take dastinib 100 mg daily,  However, his recent chest CT showed bilateral pleural effusions, which are new.  As there is no history of congestive heart failure, the question is are these effusions related to his dasatinib .  He will have a chest x-ray in 3 months.  If his effusions are worse, then a change in CML therapy would need to be considered.  He denies being significantly short of breath at this time.  His CML transcript levels will be reassessed in 3 months   2. History of stage IA non-small cell lung cancer in 2016 treated with surgery alone.  His October, 2024 CT scans showed no evidence of disease recurrence.     3. History of stage IA cholangiocarcinoma in 2014 treated with surgery alone. His October, 2024 CT scans showed no evidence of disease recurrence.     4. B12 deficiency.  He was on B12 injections monthly with his primary care office. He missed his last one and so it has been over a month since his last one and yet his level was good at 667 so I told him he can stop the B-12 injections for now. His B12 level will be rechecked before his next visit.   5. Alcoholic Hepatitis. He knows he needs to quit drinking but is struggling to do so.   Plan: Patient was admitted into the hospital in January, 2025 and was found to have acute congestive heart failure. He was instructed to stop Lasix  and was placed on torsemide  20 mg and recommened follow-up with his cardiologist. He followed up with his cardiologist and received a good report from him. He had a echocardiogram in December, 2024 which was normal. I agreed with stopping Lasix  and will take that off of his med list. I instructed him to continue taking torsemide  20 mg at 3 pills  daily as instructed. He had labs done on 10/21/2023 and he had a WBC of 5.0, hemoglobin of 13.6, and platelet count of 256,000. He had a slightly low sodium of 134, a elevated AST of 78, and his potassium was low-normal at 3.5 improved from 3.4. The rest of his CMP was normal. I instructed him to stay on his oral potassium 2-3 daily and try to increase his fluids. PCR for BCR/ABL was negative. However, his CEA dropped from 7.9 to 3.33 but his CA 19.9 was elevated at 46 up from 36.  He notes not having a B-12 injection for over a month now. His B-12 level was 667 as of January, 2025 and was previously 370. I advised that he stop his B-12 injections and will recheck his levels in the future. I also provided a copy of his labs to bring to his next PCP appointment. I will see him back in 3 months with CBC, CMP, CEA, CA 19.9, and PCR for BCR/ABL done a week before his appointment with me in April. The patient understands the plans discussed today and is in agreement with them.  He knows to contact our office if he develops concerns prior to his next appointment.   I provided 20 minutes of face-to-face time during this encounter and > 50% was spent counseling as documented under my assessment and  plan.   Nolia Baumgartner, MD [name] DEPT OF [facility] [address] [place] [address] Dept: [phone] Dept Fax: [phone]   No orders of the defined types were placed in this encounter.   CHIEF COMPLAINT:  CC: Chronic myelogenous leukemia with history of lung cancer and cholangiocarcinoma  Current Treatment: Dasatinib  100 mg daily  HISTORY OF PRESENT ILLNESS:  The patient is a 73 year old man with CML which has been kept under ideal control with dasatinib .  He comes in today for routine follow-up, as well as to go his CT scans, which were done to ensure there is no radiographic evidence of disease recurrence, as it pertains to both  his early stage lung and biliary cancers.  Since his last visit, the patient has been doing well.  He denies having any new symptoms or findings which concern him for overt signs of disease recurrence.    Chronic myelogenous leukemia diagnosed in October 2015 when he presented with leukocytosis.  He was originally placed on nilotinib, but developed a severe pruritic rash, so was switched to dasatinib  100 mg daily.  He achieved a major molecular remission by May 2016.   He also has a history of a stage IA cholangiocarcinoma, treated with surgical resection in June 2014.   He then underwent surgical resection of a stage IA (T1 N0 M0) non-small cell lung cancer in February 2016.  Pathology revealed a 3 cm, well-differentiated, adenocarcinoma with negative nodes.  Adjuvant chemotherapy for his lung cancer was not recommended.  He has chronic back pain and continues seeing spine specialist.  He apparently has significant degenerative disc disease.  He has also had upper abdominal pain, which he attributes to dasatinib .  He uses hydrocodone /APAP as needed for his pain.  In May 2018, PCR was positive at 0.012%, consistent with residual CML.  Repeat PCR in August 2018 revealed a major molecular response.  His CML has remained in remission since that time.  CT chest, abdomen and pelvis in November 2019 did not reveal evidence of recurrence of either malignancy.     CT chest in August 2020 did not reveal any evidence of recurrent lung malignancy.  He has had hypocalcemia, so was instructed to take calcium 600 mg twice daily.  When he was seen in November 2020, had been having lower leg pain worsening throughout the day.  He underwent arterial ultrasound/ankle brachial indices, which was normal.  The pain was then felt to possibly be degenerative in nature.  He reported constipation due to the calcium and as his on calcium was normal at that time,  we decreased the calcium to daily.  He had worsening neutropenia and was  found to have B12 and placed on B12 injections weekly for 4 weeks, then monthly.  Serum folate was normal.  He had persistent abdominal pain, so was referred to Dr. Venice Gillis.  He was seen by Dr. Venice Gillis in 2021 and had 2 gastric ulcers and H. Pylori infection, which was treated.  He saw Dr. Reginal Capra earlier in 2021 as well, and PSA was normal. When he was seen in August, he had recurrent hypocalcemia, with a calcium of 8.3.  He could not tolerate the calcium supplement, so we recommended that he take Tums 4 times daily.  He continued to complain of intermittent abdominal pain, which we have attributed to adhesions.  He has had rotator cuff surgery in January 2022.  He has had  a colonoscopy by Dr. Venice Gillis with findings of a large polyp.  He will be going back on August 8th to have this resected through colonoscopy.  He missed his appointment in May but Dr. Venice Gillis told him "his labs were good".  I looked up and his PCR for BCR/ABL continues to be negative on the May reading.  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HCC)  02/15/2013 Initial Diagnosis   Carcinoma of biliary tract (HCC)   03/06/2013 Cancer Staging   Staging form: Distal Bile Ducts, AJCC 7th Edition - Clinical stage from 03/06/2013: Stage IA (T1, N0, M0) - Signed by Nolia Baumgartner, MD on 11/09/2020 Staged by: Managing physician Diagnostic confirmation: Positive histology Specimen type: Excision Histopathologic type: Cholangiocarcinoma Stage prefix: Initial diagnosis Lymph-vascular invasion (LVI): LVI not present (absent)/not identified Residual tumor (R): R0 - None Stage used in treatment planning: Yes National guidelines used in treatment planning: Yes Type of national guideline used in treatment planning: NCCN   Non-small cell carcinoma of lung (HCC)  11/08/2014 Cancer Staging   Staging form: Lung, AJCC 7th Edition - Clinical stage from 11/08/2014: Stage IA (T1b, N0, M0) - Signed by Nolia Baumgartner, MD on 11/09/2020 Staged by:  Managing physician Diagnostic confirmation: Positive histology Specimen type: Excision Histopathologic type: Adenocarcinoma, NOS Stage prefix: Initial diagnosis Laterality: Left Tumor size (mm): 30 Histologic grade (G): G1 Lymph-vascular invasion (LVI): LVI not present (absent)/not identified Residual tumor (R): R0 - None Pleural/elastic layer invasion: PL0 Prognostic indicators: Resection LUL Stage used in treatment planning: Yes National guidelines used in treatment planning: Yes Type of national guideline used in treatment planning: NCCN   01/31/2015 Initial Diagnosis   Non-small cell carcinoma of lung (HCC)      INTERVAL HISTORY:  Kyle Montoya is here for routine follow up for chronic myelogenous leukemia with history of lung cancer and cholangiocarcinoma. Patient states that he feels *** and ***.      He denies fever, chills, night sweats, or other signs of infection. He denies cardiorespiratory and gastrointestinal issues. He  denies pain. His appetite is *** and her weight {Weight change:10426}.  Patient states that he feels well and has no complaints of pain. He endorses not smoking in the last 6 months but continues to drink some alcohol. Patient was admitted into the hospital in January, 2025 and was found to have acute congestive heart failure. He was instructed to stop Lasix  and was placed on torsemide  20 mg and recommened follow-up with his cardiologist. He followed up with his cardiologist and received a good report from him. He had a echocardiogram in December, 2024 which was normal. I agreed with stopping Lasix  and will take that off of his med list. I instructed him to continue taking torsemide  20 mg but 3 pills daily as instructed. He had labs done on 10/21/2023 and he had a WBC of 5.0, hemoglobin of 13.6, and platelet count of 256,000. He had a slightly low sodium of 134, a elevated AST of 78, and his potassium was low-normal at 3.5 improved from 3.4. The rest of his CMP was  normal. I instructed him to stay on his oral potassium 2-3 daily and try to increase his fluids. PCR for BCR/ABL was negative. However, his CEA dropped from 7.9 to 3.33 but his CA 19.9 was elevated at 46 up from 36.  He notes not having a B-12 injection for over a month now. His B-12 level was 667 as of January, 2025 and was previously 370. I advised  that he stop his B-12 injections and will recheck his levels in the future. I also provided a copy of his labs to bring to his next PCP appointment. I will see him back in 3 months with CBC, CMP, CEA, CA 19.9, and PCR for BCR/ABL done a week before his appointment with me in April. He denies signs of infection such as sore throat, sinus drainage, cough, or urinary symptoms.  He denies fevers or recurrent chills. He denies pain. He denies nausea, vomiting, chest pain, dyspnea or cough. His appetite is good and his weight has increased 7 pounds over last 3 weeks .  REVIEW OF SYSTEMS:  Review of Systems  Constitutional: Negative.  Negative for appetite change, chills, diaphoresis, fatigue, fever and unexpected weight change.  HENT:  Negative.  Negative for hearing loss, lump/mass, mouth sores, nosebleeds, sore throat, tinnitus, trouble swallowing and voice change.   Eyes: Negative.  Negative for eye problems and icterus.  Respiratory: Negative.  Negative for chest tightness, cough, hemoptysis, shortness of breath and wheezing.   Cardiovascular: Negative.  Negative for chest pain, leg swelling and palpitations.  Gastrointestinal: Negative.  Negative for abdominal distention, abdominal pain, blood in stool, constipation, diarrhea, nausea, rectal pain and vomiting.  Endocrine: Negative.   Genitourinary: Negative.  Negative for bladder incontinence, difficulty urinating, dyspareunia, dysuria, frequency, hematuria, nocturia, pelvic pain and penile discharge.   Musculoskeletal: Negative.  Negative for arthralgias, back pain, flank pain, gait problem, myalgias, neck  pain and neck stiffness.  Skin: Negative.  Negative for itching, rash and wound.  Neurological: Negative.  Negative for dizziness, extremity weakness, gait problem, headaches, light-headedness, numbness, seizures and speech difficulty.  Hematological: Negative.  Negative for adenopathy. Does not bruise/bleed easily.  Psychiatric/Behavioral: Negative.  Negative for confusion, decreased concentration, depression, sleep disturbance and suicidal ideas. The patient is not nervous/anxious.   All other systems reviewed and are negative.    VITALS:  There were no vitals taken for this visit.  Wt Readings from Last 3 Encounters:  12/11/23 193 lb 12.6 oz (87.9 kg)  10/30/23 208 lb 3.2 oz (94.4 kg)  10/20/23 204 lb 6.4 oz (92.7 kg)    There is no height or weight on file to calculate BMI.  Performance status (ECOG): 0 - Asymptomatic  PHYSICAL EXAM:  Physical Exam Vitals and nursing note reviewed.  Constitutional:      General: He is not in acute distress.    Appearance: Normal appearance. He is normal weight. He is not ill-appearing, toxic-appearing or diaphoretic.  HENT:     Head: Normocephalic and atraumatic.     Right Ear: Tympanic membrane, ear canal and external ear normal. There is no impacted cerumen.     Left Ear: Tympanic membrane, ear canal and external ear normal. There is no impacted cerumen.     Nose: Nose normal. No congestion or rhinorrhea.     Mouth/Throat:     Mouth: Mucous membranes are moist.     Pharynx: Oropharynx is clear. No oropharyngeal exudate or posterior oropharyngeal erythema.  Eyes:     General: No scleral icterus.       Right eye: No discharge.        Left eye: No discharge.     Extraocular Movements: Extraocular movements intact.     Conjunctiva/sclera: Conjunctivae normal.     Pupils: Pupils are equal, round, and reactive to light.  Neck:     Vascular: No carotid bruit.  Cardiovascular:     Rate and Rhythm: Normal rate  and regular rhythm.     Pulses:  Normal pulses.     Heart sounds: Normal heart sounds. No murmur heard.    No friction rub. No gallop.  Pulmonary:     Effort: Pulmonary effort is normal. No respiratory distress.     Breath sounds: Normal breath sounds. No stridor. No wheezing, rhonchi or rales.  Chest:     Chest wall: No tenderness.  Abdominal:     General: Bowel sounds are normal. There is no distension.     Palpations: Abdomen is soft. There is no hepatomegaly, splenomegaly or mass.     Tenderness: There is abdominal tenderness in the right upper quadrant. There is no right CVA tenderness, left CVA tenderness, guarding or rebound.     Hernia: No hernia is present.     Comments: Tenderness in the right upper quadrant Mild hepatomegaly  Musculoskeletal:        General: No swelling, tenderness, deformity or signs of injury. Normal range of motion.     Cervical back: Normal range of motion and neck supple. No rigidity or tenderness.     Right lower leg: No edema.     Left lower leg: No edema.  Lymphadenopathy:     Cervical: No cervical adenopathy.  Skin:    General: Skin is warm and dry.     Coloration: Skin is not jaundiced or pale.     Findings: No bruising, erythema, lesion or rash.  Neurological:     General: No focal deficit present.     Mental Status: He is alert and oriented to person, place, and time. Mental status is at baseline.     Cranial Nerves: No cranial nerve deficit.     Sensory: No sensory deficit.     Motor: No weakness.     Coordination: Coordination normal.     Gait: Gait normal.     Deep Tendon Reflexes: Reflexes normal.  Psychiatric:        Mood and Affect: Mood normal.        Behavior: Behavior normal.        Thought Content: Thought content normal.        Judgment: Judgment normal.    LABS:      Latest Ref Rng & Units 01/20/2024    9:34 AM 12/10/2023    5:24 AM 12/06/2023    5:55 AM  CBC  WBC 4.0 - 10.5 K/uL 3.8  4.3  5.4   Hemoglobin 13.0 - 17.0 g/dL 09.8  11.9  14.7    Hematocrit 39.0 - 52.0 % 35.8  35.3  37.9   Platelets 150 - 400 K/uL 309  226  223       Latest Ref Rng & Units 01/20/2024    9:34 AM 12/11/2023    9:54 AM 12/10/2023    5:24 AM  CMP  Glucose 70 - 99 mg/dL 89  829  91   BUN 8 - 23 mg/dL 13  27  36   Creatinine 0.61 - 1.24 mg/dL 5.62  1.30  8.65   Sodium 135 - 145 mmol/L 132  134  134   Potassium 3.5 - 5.1 mmol/L 3.8  3.7  3.3   Chloride 98 - 111 mmol/L 98  101  99   CO2 22 - 32 mmol/L 23  23  23    Calcium 8.9 - 10.3 mg/dL 8.6  9.1  8.9   Total Protein 6.5 - 8.1 g/dL 6.5     Total Bilirubin 0.0 -  1.2 mg/dL 0.5     Alkaline Phos 38 - 126 U/L 85     AST 15 - 41 U/L 33     ALT 0 - 44 U/L 11      Lab Results  Component Value Date   CEA1 7.9 (H) 07/17/2023   CEA 1.11 01/20/2024   /  CEA  Date Value Ref Range Status  07/17/2023 7.9 (H) 0.0 - 4.7 ng/mL Final    Comment:    (NOTE)                             Nonsmokers          <3.9                             Smokers             <5.6 Roche [employer] Electrochemiluminescence Immunoassay (ECLIA) Values obtained with different assay methods or kits cannot be used interchangeably.  Results cannot be interpreted as absolute evidence of the presence or absence of malignant disease. Performed At: [facility] [address] [place], [place] [id number] Pearlean Botts MD WU:[id number]    CEA [facility])  Date Value Ref Range Status  01/20/2024 1.11 0.00 - 5.00 ng/mL Final    Comment:    (NOTE) This test was performed using [employer] Coulter's paramagnetic chemiluminescent immunoassay. Values obtained from different assay methods cannot be used interchangeably. Please note that up to 8% of patients who smoke may see values 5.1-10.0 ng/ml and 1% of patients who smoke may see CEA levels >10.0 ng/ml. Performed at [facility], [address], [place], [place] [address]    Lab Results  Component Value Date   [id number] 01/20/2024   STUDIES:   EXAM:  10/21/2023 CHEST - 2 VIEW IMPRESSION: Stable small right pleural effusion and mild right basilar atelectasis. No new or progressive disease.  EXAM: 10/21/2023 CHEST - 2 VIEW IMPRESSION: Stable small right pleural effusion and mild right basilar atelectasis. No new or progressive disease.  HISTORY:   Allergies:  Allergies  Allergen Reactions   Pneumococcal Vaccines Swelling    Current Medications: Current Outpatient Medications  Medication Sig Dispense Refill   albuterol  (PROVENTIL  HFA;VENTOLIN  HFA) 108 (90 BASE) MCG/ACT inhaler Inhale 2 puffs into the lungs every 6 (six) hours as needed for wheezing or shortness of breath.     allopurinol  (ZYLOPRIM ) 300 MG tablet Take 300 mg by mouth daily.     aspirin  EC 81 MG tablet Take 1 tablet (81 mg total) by mouth daily. Swallow whole.     Calcium Carbonate (CALCIUM 500 PO) Take 1 tablet by mouth 3 (three) times daily.     Cholecalciferol (VITAMIN D ) 50 MCG (2000 UT) tablet Take 2,000 Units by mouth daily.     dasatinib  (SPRYCEL ) 100 MG tablet Take 1 tablet (100 mg total) by mouth daily. 30 tablet 5   diclofenac Sodium (VOLTAREN) 1 % GEL Apply 2 g topically at bedtime as needed (Pain).     fluticasone  (FLONASE ) 50 MCG/ACT nasal spray Place 2 sprays into both nostrils daily.     folic acid  (FOLVITE ) 1 MG tablet Take 1 tablet (1 mg total) by mouth daily.     HYDROcodone -acetaminophen  (NORCO/VICODIN) 5-325 MG tablet Take 1 tablet by mouth 4 (four) times daily as needed for moderate pain (pain score 4-6).     methocarbamol  (ROBAXIN ) 750 MG  tablet Take 750 mg by mouth every 6 (six) hours as needed for muscle spasms.     midodrine  (PROAMATINE ) 5 MG tablet Take 3 tablets (15 mg total) by mouth 3 (three) times daily with meals. 90 tablet 1   Multiple Vitamin (MULTIVITAMIN WITH MINERALS) TABS tablet Take 1 tablet by mouth daily.     omeprazole  (PRILOSEC) 40 MG capsule TAKE 1 CAPSULE (40 MG TOTAL) BY MOUTH 2 (TWO) TIMES DAILY BEFORE A MEAL. 180 capsule  0   polyethylene glycol (MIRALAX  / GLYCOLAX ) 17 g packet Take 17 g by mouth daily as needed for moderate constipation. 14 each 0   tamsulosin  (FLOMAX ) 0.4 MG CAPS capsule Take 0.8 mg by mouth daily.     thiamine  (VITAMIN B-1) 100 MG tablet Take 1 tablet (100 mg total) by mouth daily.     TRELEGY ELLIPTA 100-62.5-25 MCG/INH AEPB Inhale 1 puff into the lungs daily at 6 (six) AM.     No current facility-administered medications for this visit.    I,Jasmine M Lassiter,acting as a scribe for Nolia Baumgartner, MD.,have documented all relevant documentation on the behalf of Nolia Baumgartner, MD,as directed by  Nolia Baumgartner, MD while in the presence of Nolia Baumgartner, MD.  I have reviewed this report as typed by the medical scribe, and it is complete and accurate.

## 2024-01-24 LAB — BCR-ABL1, CML/ALL, PCR, QUANT
E1A2 Transcript: <0.0032 %
Interpretation (BCRAL):: NEGATIVE
b2a2 transcript: <0.0032 %
b3a2 transcript: <0.0032 %

## 2024-01-27 ENCOUNTER — Inpatient Hospital Stay: Payer: BC Managed Care – PPO | Admitting: Oncology

## 2024-01-27 DIAGNOSIS — C3492 Malignant neoplasm of unspecified part of left bronchus or lung: Secondary | ICD-10-CM

## 2024-01-27 DIAGNOSIS — C249 Malignant neoplasm of biliary tract, unspecified: Secondary | ICD-10-CM

## 2024-01-27 DIAGNOSIS — C9210 Chronic myeloid leukemia, BCR/ABL-positive, not having achieved remission: Secondary | ICD-10-CM

## 2024-01-28 NOTE — Progress Notes (Incomplete)
 [facility]  [address] [place],  [place]  [address] [phone]  Clinic Day: 02/03/24   Referring physician: Teofilo Fellers, NP  ASSESSMENT & PLAN:  Assessment:    1. 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  He will continue to take dastinib 100 mg daily,  However, his recent chest CT showed bilateral pleural effusions, which are new.  As there is no history of congestive heart failure, the question is are these effusions related to his dasatinib .  He will have a chest x-ray in 3 months.  If his effusions are worse, then a change in CML therapy would need to be considered.  He denies being significantly short of breath at this time.  His CML transcript levels will be reassessed in 3 months   2. History of stage IA non-small cell lung cancer in 2016 treated with surgery alone.  His October, 2024 CT scans showed no evidence of disease recurrence.     3. History of stage IA cholangiocarcinoma in 2014 treated with surgery alone. His October, 2024 CT scans showed no evidence of disease recurrence.     4. B12 deficiency.  He was on B12 injections monthly with his primary care office. He missed his last one and so it has been over a month since his last one and yet his level was good at 667 so I told him he can stop the B-12 injections for now. His B12 level will be rechecked before his next visit.   5. Alcoholic Hepatitis. He knows he needs to quit drinking but is struggling to do so.   Plan: Patient was admitted into the hospital in January, 2025 and was found to have acute congestive heart failure. He was instructed to stop Lasix  and was placed on torsemide  20 mg and recommened follow-up with his cardiologist. He followed up with his cardiologist and received a good report from him. He had a echocardiogram in December, 2024 which was normal. I agreed with stopping Lasix  and will take that off of his med list. I instructed him to continue taking torsemide  20 mg at 3  pills daily as instructed. He had labs done on 10/21/2023 and he had a WBC of 5.0, hemoglobin of 13.6, and platelet count of 256,000. He had a slightly low sodium of 134, a elevated AST of 78, and his potassium was low-normal at 3.5 improved from 3.4. The rest of his CMP was normal. I instructed him to stay on his oral potassium 2-3 daily and try to increase his fluids. PCR for BCR/ABL was negative. However, his CEA dropped from 7.9 to 3.33 but his CA 19.9 was elevated at 46 up from 36.  He notes not having a B-12 injection for over a month now. His B-12 level was 667 as of January, 2025 and was previously 370. I advised that he stop his B-12 injections and will recheck his levels in the future. I also provided a copy of his labs to bring to his next PCP appointment. I will see him back in 3 months with CBC, CMP, CEA, CA 19.9, and PCR for BCR/ABL done a week before his appointment with me in April. The patient understands the plans discussed today and is in agreement with them.  He knows to contact our office if he develops concerns prior to his next appointment.   I provided 20 minutes of face-to-face time during this encounter and > 50% was spent counseling as documented under my assessment  and plan.   Nolia Baumgartner, MD [name] - A DEPT OF [facility] [address] [place] [address] Dept: [phone] Dept Fax: [phone]   No orders of the defined types were placed in this encounter.   CHIEF COMPLAINT:  CC: Chronic myelogenous leukemia with history of lung cancer and cholangiocarcinoma  Current Treatment: Dasatinib  100 mg daily  HISTORY OF PRESENT ILLNESS:  The patient is a 73 year old man with CML which has been kept under ideal control with dasatinib .  He comes in today for routine follow-up, as well as to go his CT scans, which were done to ensure there is no radiographic evidence of disease recurrence, as it pertains to  both his early stage lung and biliary cancers.  Since his last visit, the patient has been doing well.  He denies having any new symptoms or findings which concern him for overt signs of disease recurrence.    Chronic myelogenous leukemia diagnosed in October 2015 when he presented with leukocytosis.  He was originally placed on nilotinib, but developed a severe pruritic rash, so was switched to dasatinib  100 mg daily.  He achieved a major molecular remission by May 2016.   He also has a history of a stage IA cholangiocarcinoma, treated with surgical resection in June 2014.   He then underwent surgical resection of a stage IA (T1 N0 M0) non-small cell lung cancer in February 2016.  Pathology revealed a 3 cm, well-differentiated, adenocarcinoma with negative nodes.  Adjuvant chemotherapy for his lung cancer was not recommended.  He has chronic back pain and continues seeing spine specialist.  He apparently has significant degenerative disc disease.  He has also had upper abdominal pain, which he attributes to dasatinib .  He uses hydrocodone /APAP as needed for his pain.  In May 2018, PCR was positive at 0.012%, consistent with residual CML.  Repeat PCR in August 2018 revealed a major molecular response.  His CML has remained in remission since that time.  CT chest, abdomen and pelvis in November 2019 did not reveal evidence of recurrence of either malignancy.     CT chest in August 2020 did not reveal any evidence of recurrent lung malignancy.  He has had hypocalcemia, so was instructed to take calcium 600 mg twice daily.  When he was seen in November 2020, had been having lower leg pain worsening throughout the day.  He underwent arterial ultrasound/ankle brachial indices, which was normal.  The pain was then felt to possibly be degenerative in nature.  He reported constipation due to the calcium and as his on calcium was normal at that time,  we decreased the calcium to daily.  He had worsening neutropenia and  was found to have B12 and placed on B12 injections weekly for 4 weeks, then monthly.  Serum folate was normal.  He had persistent abdominal pain, so was referred to Dr. Venice Gillis.  He was seen by Dr. Venice Gillis in 2021 and had 2 gastric ulcers and H. Pylori infection, which was treated.  He saw Dr. Reginal Capra earlier in 2021 as well, and PSA was normal. When he was seen in August, he had recurrent hypocalcemia, with a calcium of 8.3.  He could not tolerate the calcium supplement, so we recommended that he take Tums 4 times daily.  He continued to complain of intermittent abdominal pain, which we have attributed to adhesions.  He has had rotator cuff surgery in January 2022.  He has  had a colonoscopy by Dr. Venice Gillis with findings of a large polyp.  He will be going back on August 8th to have this resected through colonoscopy.  He missed his appointment in May but Dr. Venice Gillis told him "his labs were good".  I looked up and his PCR for BCR/ABL continues to be negative on the May reading.  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HCC)  02/15/2013 Initial Diagnosis   Carcinoma of biliary tract (HCC)   03/06/2013 Cancer Staging   Staging form: Distal Bile Ducts, AJCC 7th Edition - Clinical stage from 03/06/2013: Stage IA (T1, N0, M0) - Signed by Nolia Baumgartner, MD on 11/09/2020 Staged by: Managing physician Diagnostic confirmation: Positive histology Specimen type: Excision Histopathologic type: Cholangiocarcinoma Stage prefix: Initial diagnosis Lymph-vascular invasion (LVI): LVI not present (absent)/not identified Residual tumor (R): R0 - None Stage used in treatment planning: Yes National guidelines used in treatment planning: Yes Type of national guideline used in treatment planning: NCCN   Non-small cell carcinoma of lung (HCC)  11/08/2014 Cancer Staging   Staging form: Lung, AJCC 7th Edition - Clinical stage from 11/08/2014: Stage IA (T1b, N0, M0) - Signed by Nolia Baumgartner, MD on 11/09/2020 Staged by:  Managing physician Diagnostic confirmation: Positive histology Specimen type: Excision Histopathologic type: Adenocarcinoma, NOS Stage prefix: Initial diagnosis Laterality: Left Tumor size (mm): 30 Histologic grade (G): G1 Lymph-vascular invasion (LVI): LVI not present (absent)/not identified Residual tumor (R): R0 - None Pleural/elastic layer invasion: PL0 Prognostic indicators: Resection LUL Stage used in treatment planning: Yes National guidelines used in treatment planning: Yes Type of national guideline used in treatment planning: NCCN   01/31/2015 Initial Diagnosis   Non-small cell carcinoma of lung (HCC)      INTERVAL HISTORY:  Jereld is here for routine follow up for chronic myelogenous leukemia with history of lung cancer and cholangiocarcinoma. Patient states that he feels *** and ***.        He denies fever, chills, night sweats, or other signs of infection. He denies cardiorespiratory and gastrointestinal issues. He  denies pain. His appetite is *** and her weight {Weight change:10426}.  Patient states that he feels well and has no complaints of pain. He endorses not smoking in the last 6 months but continues to drink some alcohol. Patient was admitted into the hospital in January, 2025 and was found to have acute congestive heart failure. He was instructed to stop Lasix  and was placed on torsemide  20 mg and recommened follow-up with his cardiologist. He followed up with his cardiologist and received a good report from him. He had a echocardiogram in December, 2024 which was normal. I agreed with stopping Lasix  and will take that off of his med list. I instructed him to continue taking torsemide  20 mg but 3 pills daily as instructed. He had labs done on 10/21/2023 and he had a WBC of 5.0, hemoglobin of 13.6, and platelet count of 256,000. He had a slightly low sodium of 134, a elevated AST of 78, and his potassium was low-normal at 3.5 improved from 3.4. The rest of his CMP  was normal. I instructed him to stay on his oral potassium 2-3 daily and try to increase his fluids. PCR for BCR/ABL was negative. However, his CEA dropped from 7.9 to 3.33 but his CA 19.9 was elevated at 46 up from 36.  He notes not having a B-12 injection for over a month now. His B-12 level was 667 as of January, 2025 and was previously  370. I advised that he stop his B-12 injections and will recheck his levels in the future. I also provided a copy of his labs to bring to his next PCP appointment. I will see him back in 3 months with CBC, CMP, CEA, CA 19.9, and PCR for BCR/ABL done a week before his appointment with me in April. He denies signs of infection such as sore throat, sinus drainage, cough, or urinary symptoms.  He denies fevers or recurrent chills. He denies pain. He denies nausea, vomiting, chest pain, dyspnea or cough. His appetite is good and his weight has increased 7 pounds over last 3 weeks.  REVIEW OF SYSTEMS:  Review of Systems  Constitutional: Negative.  Negative for appetite change, chills, diaphoresis, fatigue, fever and unexpected weight change.  HENT:  Negative.  Negative for hearing loss, lump/mass, mouth sores, nosebleeds, sore throat, tinnitus, trouble swallowing and voice change.   Eyes: Negative.  Negative for eye problems and icterus.  Respiratory: Negative.  Negative for chest tightness, cough, hemoptysis, shortness of breath and wheezing.   Cardiovascular: Negative.  Negative for chest pain, leg swelling and palpitations.  Gastrointestinal: Negative.  Negative for abdominal distention, abdominal pain, blood in stool, constipation, diarrhea, nausea, rectal pain and vomiting.  Endocrine: Negative.   Genitourinary: Negative.  Negative for bladder incontinence, difficulty urinating, dyspareunia, dysuria, frequency, hematuria, nocturia, pelvic pain and penile discharge.   Musculoskeletal: Negative.  Negative for arthralgias, back pain, flank pain, gait problem, myalgias,  neck pain and neck stiffness.  Skin: Negative.  Negative for itching, rash and wound.  Neurological: Negative.  Negative for dizziness, extremity weakness, gait problem, headaches, light-headedness, numbness, seizures and speech difficulty.  Hematological: Negative.  Negative for adenopathy. Does not bruise/bleed easily.  Psychiatric/Behavioral: Negative.  Negative for confusion, decreased concentration, depression, sleep disturbance and suicidal ideas. The patient is not nervous/anxious.   All other systems reviewed and are negative.    VITALS:  There were no vitals taken for this visit.  Wt Readings from Last 3 Encounters:  12/11/23 193 lb 12.6 oz (87.9 kg)  10/30/23 208 lb 3.2 oz (94.4 kg)  10/20/23 204 lb 6.4 oz (92.7 kg)    There is no height or weight on file to calculate BMI.  Performance status (ECOG): 0 - Asymptomatic  PHYSICAL EXAM:  Physical Exam Vitals and nursing note reviewed.  Constitutional:      General: He is not in acute distress.    Appearance: Normal appearance. He is normal weight. He is not ill-appearing, toxic-appearing or diaphoretic.  HENT:     Head: Normocephalic and atraumatic.     Right Ear: Tympanic membrane, ear canal and external ear normal. There is no impacted cerumen.     Left Ear: Tympanic membrane, ear canal and external ear normal. There is no impacted cerumen.     Nose: Nose normal. No congestion or rhinorrhea.     Mouth/Throat:     Mouth: Mucous membranes are moist.     Pharynx: Oropharynx is clear. No oropharyngeal exudate or posterior oropharyngeal erythema.  Eyes:     General: No scleral icterus.       Right eye: No discharge.        Left eye: No discharge.     Extraocular Movements: Extraocular movements intact.     Conjunctiva/sclera: Conjunctivae normal.     Pupils: Pupils are equal, round, and reactive to light.  Neck:     Vascular: No carotid bruit.  Cardiovascular:     Rate and Rhythm:  Normal rate and regular rhythm.      Pulses: Normal pulses.     Heart sounds: Normal heart sounds. No murmur heard.    No friction rub. No gallop.  Pulmonary:     Effort: Pulmonary effort is normal. No respiratory distress.     Breath sounds: Normal breath sounds. No stridor. No wheezing, rhonchi or rales.  Chest:     Chest wall: No tenderness.  Abdominal:     General: Bowel sounds are normal. There is no distension.     Palpations: Abdomen is soft. There is no hepatomegaly, splenomegaly or mass.     Tenderness: There is abdominal tenderness in the right upper quadrant. There is no right CVA tenderness, left CVA tenderness, guarding or rebound.     Hernia: No hernia is present.     Comments: Tenderness in the right upper quadrant Mild hepatomegaly  Musculoskeletal:        General: No swelling, tenderness, deformity or signs of injury. Normal range of motion.     Cervical back: Normal range of motion and neck supple. No rigidity or tenderness.     Right lower leg: No edema.     Left lower leg: No edema.  Lymphadenopathy:     Cervical: No cervical adenopathy.  Skin:    General: Skin is warm and dry.     Coloration: Skin is not jaundiced or pale.     Findings: No bruising, erythema, lesion or rash.  Neurological:     General: No focal deficit present.     Mental Status: He is alert and oriented to person, place, and time. Mental status is at baseline.     Cranial Nerves: No cranial nerve deficit.     Sensory: No sensory deficit.     Motor: No weakness.     Coordination: Coordination normal.     Gait: Gait normal.     Deep Tendon Reflexes: Reflexes normal.  Psychiatric:        Mood and Affect: Mood normal.        Behavior: Behavior normal.        Thought Content: Thought content normal.        Judgment: Judgment normal.    LABS:      Latest Ref Rng & Units 01/20/2024    9:34 AM 12/10/2023    5:24 AM 12/06/2023    5:55 AM  CBC  WBC 4.0 - 10.5 K/uL 3.8  4.3  5.4   Hemoglobin 13.0 - 17.0 g/dL 16.1  09.6  04.5    Hematocrit 39.0 - 52.0 % 35.8  35.3  37.9   Platelets 150 - 400 K/uL 309  226  223       Latest Ref Rng & Units 01/20/2024    9:34 AM 12/11/2023    9:54 AM 12/10/2023    5:24 AM  CMP  Glucose 70 - 99 mg/dL 89  409  91   BUN 8 - 23 mg/dL 13  27  36   Creatinine 0.61 - 1.24 mg/dL 8.11  9.14  7.82   Sodium 135 - 145 mmol/L 132  134  134   Potassium 3.5 - 5.1 mmol/L 3.8  3.7  3.3   Chloride 98 - 111 mmol/L 98  101  99   CO2 22 - 32 mmol/L 23  23  23    Calcium 8.9 - 10.3 mg/dL 8.6  9.1  8.9   Total Protein 6.5 - 8.1 g/dL 6.5     Total Bilirubin  0.0 - 1.2 mg/dL 0.5     Alkaline Phos 38 - 126 U/L 85     AST 15 - 41 U/L 33     ALT 0 - 44 U/L 11      Lab Results  Component Value Date   CEA1 7.9 (H) 07/17/2023   CEA 1.11 01/20/2024   /  CEA  Date Value Ref Range Status  07/17/2023 7.9 (H) 0.0 - 4.7 ng/mL Final    Comment:    (NOTE)                             Nonsmokers          <3.9                             Smokers             <5.6 Roche [employer] Electrochemiluminescence Immunoassay (ECLIA) Values obtained with different assay methods or kits cannot be used interchangeably.  Results cannot be interpreted as absolute evidence of the presence or absence of malignant disease. Performed At: [facility] [address] [place], [place] [id number] Pearlean Botts MD VH:[id number]    CEA [facility])  Date Value Ref Range Status  01/20/2024 1.11 0.00 - 5.00 ng/mL Final    Comment:    (NOTE) This test was performed using [employer] Coulter's paramagnetic chemiluminescent immunoassay. Values obtained from different assay methods cannot be used interchangeably. Please note that up to 8% of patients who smoke may see values 5.1-10.0 ng/ml and 1% of patients who smoke may see CEA levels >10.0 ng/ml. Performed at [facility], [address], [place], [place] [address]    Lab Results  Component Value Date   MWN027 25 01/20/2024   Lab Results   Component Value Date   VITAMINB12 374 01/20/2024            Component Ref Range & Units (hover) 8 d ago (01/20/24) 3 mo ago (10/21/23) 6 mo ago (07/17/23) 10 mo ago (04/03/23) 1 yr ago (12/26/22) 1 yr ago (09/20/22) 1 yr ago (07/01/22)  b2a2 transcript <0.0032 % <0.0032 % <0.0032 % <0.0032 % Comment CM Comment CM Comment CM  b3a2 transcript <0.0032 % <0.0032 % <0.0032 % <0.0032 % Comment CM Comment CM Comment CM  E1A2 Transcript <0.0032 % <0.0032 % <0.0032 % <0.0032 % Comment CM Comment CM Comment CM  Interpretation (BCRAL): Negative Negative CM Negative CM Negative CM Negative CM Negative CM Negative C     STUDIES:  EXAM: 10/21/2023 CHEST - 2 VIEW IMPRESSION: Stable small right pleural effusion and mild right basilar atelectasis. No new or progressive disease.      HISTORY:   Allergies:  Allergies  Allergen Reactions   Pneumococcal Vaccines Swelling    Current Medications: Current Outpatient Medications  Medication Sig Dispense Refill   albuterol  (PROVENTIL  HFA;VENTOLIN  HFA) 108 (90 BASE) MCG/ACT inhaler Inhale 2 puffs into the lungs every 6 (six) hours as needed for wheezing or shortness of breath.     allopurinol  (ZYLOPRIM ) 300 MG tablet Take 300 mg by mouth daily.     aspirin  EC 81 MG tablet Take 1 tablet (81 mg total) by mouth daily. Swallow whole.     Calcium Carbonate (CALCIUM 500 PO) Take 1 tablet by mouth 3 (three) times daily.     Cholecalciferol (VITAMIN D ) 50 MCG (2000 UT) tablet Take 2,000 Units by mouth  daily.     dasatinib  (SPRYCEL ) 100 MG tablet Take 1 tablet (100 mg total) by mouth daily. 30 tablet 5   diclofenac Sodium (VOLTAREN) 1 % GEL Apply 2 g topically at bedtime as needed (Pain).     fluticasone  (FLONASE ) 50 MCG/ACT nasal spray Place 2 sprays into both nostrils daily.     folic acid  (FOLVITE ) 1 MG tablet Take 1 tablet (1 mg total) by mouth daily.     HYDROcodone -acetaminophen  (NORCO/VICODIN) 5-325 MG tablet Take 1 tablet by mouth 4 (four) times  daily as needed for moderate pain (pain score 4-6).     methocarbamol  (ROBAXIN ) 750 MG tablet Take 750 mg by mouth every 6 (six) hours as needed for muscle spasms.     midodrine  (PROAMATINE ) 5 MG tablet Take 3 tablets (15 mg total) by mouth 3 (three) times daily with meals. 90 tablet 1   Multiple Vitamin (MULTIVITAMIN WITH MINERALS) TABS tablet Take 1 tablet by mouth daily.     omeprazole  (PRILOSEC) 40 MG capsule TAKE 1 CAPSULE (40 MG TOTAL) BY MOUTH 2 (TWO) TIMES DAILY BEFORE A MEAL. 180 capsule 0   polyethylene glycol (MIRALAX  / GLYCOLAX ) 17 g packet Take 17 g by mouth daily as needed for moderate constipation. 14 each 0   tamsulosin  (FLOMAX ) 0.4 MG CAPS capsule Take 0.8 mg by mouth daily.     thiamine  (VITAMIN B-1) 100 MG tablet Take 1 tablet (100 mg total) by mouth daily.     TRELEGY ELLIPTA 100-62.5-25 MCG/INH AEPB Inhale 1 puff into the lungs daily at 6 (six) AM.     No current facility-administered medications for this visit.    I,Jasmine M Lassiter,acting as a scribe for Nolia Baumgartner, MD.,have documented all relevant documentation on the behalf of Nolia Baumgartner, MD,as directed by  Nolia Baumgartner, MD while in the presence of Nolia Baumgartner, MD.  I have reviewed this report as typed by the medical scribe, and it is complete and accurate.

## 2024-02-03 ENCOUNTER — Inpatient Hospital Stay: Admitting: Oncology

## 2024-02-03 DIAGNOSIS — C9210 Chronic myeloid leukemia, BCR/ABL-positive, not having achieved remission: Secondary | ICD-10-CM

## 2024-02-03 DIAGNOSIS — E559 Vitamin D deficiency, unspecified: Secondary | ICD-10-CM | POA: Insufficient documentation

## 2024-02-03 DIAGNOSIS — C3492 Malignant neoplasm of unspecified part of left bronchus or lung: Secondary | ICD-10-CM

## 2024-02-03 DIAGNOSIS — C221 Intrahepatic bile duct carcinoma: Secondary | ICD-10-CM

## 2024-02-03 DIAGNOSIS — J4520 Mild intermittent asthma, uncomplicated: Secondary | ICD-10-CM | POA: Insufficient documentation

## 2024-02-04 NOTE — Progress Notes (Incomplete)
 [facility]  [address] [place],  [place]  [address] [phone]  Clinic Day: 02/04/24   Referring physician: Teofilo Fellers, NP  ASSESSMENT & PLAN:  Assessment:   1. 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  He will continue to take dastinib 100 mg daily,  However, his recent chest CT showed bilateral pleural effusions, which are new.  As there is no history of congestive heart failure, the question is are these effusions related to his dasatinib .  He will have a chest x-ray in 3 months.  If his effusions are worse, then a change in CML therapy would need to be considered.  He denies being significantly short of breath at this time.  His CML transcript levels will be reassessed in 3 months   2. History of stage IA non-small cell lung cancer in 2016 treated with surgery alone.  His October, 2024 CT scans showed no evidence of disease recurrence.     3. History of stage IA cholangiocarcinoma in 2014 treated with surgery alone. His October, 2024 CT scans showed no evidence of disease recurrence.     4. B12 deficiency.  He was on B12 injections monthly with his primary care office. He missed his last one and so it has been over a month since his last one and yet his level was good at 667 so I told him he can stop the B-12 injections for now. His B12 level will be rechecked before his next visit.   5. Alcoholic Hepatitis. He knows he needs to quit drinking but is struggling to do so.   Plan: Patient was admitted into the hospital in January, 2025 and was found to have acute congestive heart failure. He was instructed to stop Lasix  and was placed on torsemide  20 mg and recommened follow-up with his cardiologist. He followed up with his cardiologist and received a good report from him. He had a echocardiogram in December, 2024 which was normal. I agreed with stopping Lasix  and will take that off of his med list. I instructed him to continue taking torsemide  20 mg at 3 pills  daily as instructed. He had labs done on 10/21/2023 and he had a WBC of 5.0, hemoglobin of 13.6, and platelet count of 256,000. He had a slightly low sodium of 134, a elevated AST of 78, and his potassium was low-normal at 3.5 improved from 3.4. The rest of his CMP was normal. I instructed him to stay on his oral potassium 2-3 daily and try to increase his fluids. PCR for BCR/ABL was negative. However, his CEA dropped from 7.9 to 3.33 but his CA 19.9 was elevated at 46 up from 36.  He notes not having a B-12 injection for over a month now. His B-12 level was 667 as of January, 2025 and was previously 370. I advised that he stop his B-12 injections and will recheck his levels in the future. I also provided a copy of his labs to bring to his next PCP appointment. I will see him back in 3 months with CBC, CMP, CEA, CA 19.9, and PCR for BCR/ABL done a week before his appointment with me in April. The patient understands the plans discussed today and is in agreement with them.  He knows to contact our office if he develops concerns prior to his next appointment.   I provided 20 minutes of face-to-face time during this encounter and > 50% was spent counseling as documented under my assessment and  plan.   Nolia Baumgartner, MD [name] A DEPT OF [facility] [address] [place] [address] Dept: [phone] Dept Fax: [phone]   No orders of the defined types were placed in this encounter.   CHIEF COMPLAINT:  CC: Chronic myelogenous leukemia with history of lung cancer and cholangiocarcinoma  Current Treatment: Dasatinib  100 mg daily  HISTORY OF PRESENT ILLNESS:  The patient is a 73 year old man with CML which has been kept under ideal control with dasatinib .  He comes in today for routine follow-up, as well as to go his CT scans, which were done to ensure there is no radiographic evidence of disease recurrence, as it pertains to both  his early stage lung and biliary cancers.  Since his last visit, the patient has been doing well.  He denies having any new symptoms or findings which concern him for overt signs of disease recurrence.    Chronic myelogenous leukemia diagnosed in October 2015 when he presented with leukocytosis.  He was originally placed on nilotinib, but developed a severe pruritic rash, so was switched to dasatinib  100 mg daily.  He achieved a major molecular remission by May 2016.   He also has a history of a stage IA cholangiocarcinoma, treated with surgical resection in June 2014.   He then underwent surgical resection of a stage IA (T1 N0 M0) non-small cell lung cancer in February 2016.  Pathology revealed a 3 cm, well-differentiated, adenocarcinoma with negative nodes.  Adjuvant chemotherapy for his lung cancer was not recommended.  He has chronic back pain and continues seeing spine specialist.  He apparently has significant degenerative disc disease.  He has also had upper abdominal pain, which he attributes to dasatinib .  He uses hydrocodone /APAP as needed for his pain.  In May 2018, PCR was positive at 0.012%, consistent with residual CML.  Repeat PCR in August 2018 revealed a major molecular response.  His CML has remained in remission since that time.  CT chest, abdomen and pelvis in November 2019 did not reveal evidence of recurrence of either malignancy.     CT chest in August 2020 did not reveal any evidence of recurrent lung malignancy.  He has had hypocalcemia, so was instructed to take calcium 600 mg twice daily.  When he was seen in November 2020, had been having lower leg pain worsening throughout the day.  He underwent arterial ultrasound/ankle brachial indices, which was normal.  The pain was then felt to possibly be degenerative in nature.  He reported constipation due to the calcium and as his on calcium was normal at that time,  we decreased the calcium to daily.  He had worsening neutropenia and was  found to have B12 and placed on B12 injections weekly for 4 weeks, then monthly.  Serum folate was normal.  He had persistent abdominal pain, so was referred to Dr. Venice Gillis.  He was seen by Dr. Venice Gillis in 2021 and had 2 gastric ulcers and H. Pylori infection, which was treated.  He saw Dr. Reginal Capra earlier in 2021 as well, and PSA was normal. When he was seen in August, he had recurrent hypocalcemia, with a calcium of 8.3.  He could not tolerate the calcium supplement, so we recommended that he take Tums 4 times daily.  He continued to complain of intermittent abdominal pain, which we have attributed to adhesions.  He has had rotator cuff surgery in January 2022.  He has had  a colonoscopy by Dr. Venice Gillis with findings of a large polyp.  He will be going back on August 8th to have this resected through colonoscopy.  He missed his appointment in May but Dr. Venice Gillis told him "his labs were good".  I looked up and his PCR for BCR/ABL continues to be negative on the May reading.  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HCC)  02/15/2013 Initial Diagnosis   Carcinoma of biliary tract (HCC)   03/06/2013 Cancer Staging   Staging form: Distal Bile Ducts, AJCC 7th Edition - Clinical stage from 03/06/2013: Stage IA (T1, N0, M0) - Signed by Nolia Baumgartner, MD on 11/09/2020 Staged by: Managing physician Diagnostic confirmation: Positive histology Specimen type: Excision Histopathologic type: Cholangiocarcinoma Stage prefix: Initial diagnosis Lymph-vascular invasion (LVI): LVI not present (absent)/not identified Residual tumor (R): R0 - None Stage used in treatment planning: Yes National guidelines used in treatment planning: Yes Type of national guideline used in treatment planning: NCCN   Non-small cell carcinoma of lung (HCC)  11/08/2014 Cancer Staging   Staging form: Lung, AJCC 7th Edition - Clinical stage from 11/08/2014: Stage IA (T1b, N0, M0) - Signed by Nolia Baumgartner, MD on 11/09/2020 Staged by:  Managing physician Diagnostic confirmation: Positive histology Specimen type: Excision Histopathologic type: Adenocarcinoma, NOS Stage prefix: Initial diagnosis Laterality: Left Tumor size (mm): 30 Histologic grade (G): G1 Lymph-vascular invasion (LVI): LVI not present (absent)/not identified Residual tumor (R): R0 - None Pleural/elastic layer invasion: PL0 Prognostic indicators: Resection LUL Stage used in treatment planning: Yes National guidelines used in treatment planning: Yes Type of national guideline used in treatment planning: NCCN   01/31/2015 Initial Diagnosis   Non-small cell carcinoma of lung (HCC)      INTERVAL HISTORY:  Kyle Montoya is here for routine follow up for chronic myelogenous leukemia with history of lung cancer and cholangiocarcinoma. Patient states that he feels *** and ***.       He denies fever, chills, night sweats, or other signs of infection. He denies cardiorespiratory and gastrointestinal issues. He  denies pain. His appetite is *** and her weight {Weight change:10426}.  Patient states that he feels well and has no complaints of pain. He endorses not smoking in the last 6 months but continues to drink some alcohol. Patient was admitted into the hospital in January, 2025 and was found to have acute congestive heart failure. He was instructed to stop Lasix  and was placed on torsemide  20 mg and recommened follow-up with his cardiologist. He followed up with his cardiologist and received a good report from him. He had a echocardiogram in December, 2024 which was normal. I agreed with stopping Lasix  and will take that off of his med list. I instructed him to continue taking torsemide  20 mg but 3 pills daily as instructed. He had labs done on 10/21/2023 and he had a WBC of 5.0, hemoglobin of 13.6, and platelet count of 256,000. He had a slightly low sodium of 134, a elevated AST of 78, and his potassium was low-normal at 3.5 improved from 3.4. The rest of his CMP  was normal. I instructed him to stay on his oral potassium 2-3 daily and try to increase his fluids. PCR for BCR/ABL was negative. However, his CEA dropped from 7.9 to 3.33 but his CA 19.9 was elevated at 46 up from 36.  He notes not having a B-12 injection for over a month now. His B-12 level was 667 as of January, 2025 and was previously 370. I  advised that he stop his B-12 injections and will recheck his levels in the future. I also provided a copy of his labs to bring to his next PCP appointment. I will see him back in 3 months with CBC, CMP, CEA, CA 19.9, and PCR for BCR/ABL done a week before his appointment with me in April. He denies signs of infection such as sore throat, sinus drainage, cough, or urinary symptoms.  He denies fevers or recurrent chills. He denies pain. He denies nausea, vomiting, chest pain, dyspnea or cough. His appetite is good and his weight has increased 7 pounds over last 3 weeks.  REVIEW OF SYSTEMS:  Review of Systems  Constitutional: Negative.  Negative for appetite change, chills, diaphoresis, fatigue, fever and unexpected weight change.  HENT:  Negative.  Negative for hearing loss, lump/mass, mouth sores, nosebleeds, sore throat, tinnitus, trouble swallowing and voice change.   Eyes: Negative.  Negative for eye problems and icterus.  Respiratory: Negative.  Negative for chest tightness, cough, hemoptysis, shortness of breath and wheezing.   Cardiovascular: Negative.  Negative for chest pain, leg swelling and palpitations.  Gastrointestinal: Negative.  Negative for abdominal distention, abdominal pain, blood in stool, constipation, diarrhea, nausea, rectal pain and vomiting.  Endocrine: Negative.   Genitourinary: Negative.  Negative for bladder incontinence, difficulty urinating, dyspareunia, dysuria, frequency, hematuria, nocturia, pelvic pain and penile discharge.   Musculoskeletal: Negative.  Negative for arthralgias, back pain, flank pain, gait problem, myalgias,  neck pain and neck stiffness.  Skin: Negative.  Negative for itching, rash and wound.  Neurological: Negative.  Negative for dizziness, extremity weakness, gait problem, headaches, light-headedness, numbness, seizures and speech difficulty.  Hematological: Negative.  Negative for adenopathy. Does not bruise/bleed easily.  Psychiatric/Behavioral: Negative.  Negative for confusion, decreased concentration, depression, sleep disturbance and suicidal ideas. The patient is not nervous/anxious.   All other systems reviewed and are negative.    VITALS:  There were no vitals taken for this visit.  Wt Readings from Last 3 Encounters:  12/11/23 193 lb 12.6 oz (87.9 kg)  10/30/23 208 lb 3.2 oz (94.4 kg)  10/20/23 204 lb 6.4 oz (92.7 kg)    There is no height or weight on file to calculate BMI.  Performance status (ECOG): 0 - Asymptomatic  PHYSICAL EXAM:  Physical Exam Vitals and nursing note reviewed.  Constitutional:      General: He is not in acute distress.    Appearance: Normal appearance. He is normal weight. He is not ill-appearing, toxic-appearing or diaphoretic.  HENT:     Head: Normocephalic and atraumatic.     Right Ear: Tympanic membrane, ear canal and external ear normal. There is no impacted cerumen.     Left Ear: Tympanic membrane, ear canal and external ear normal. There is no impacted cerumen.     Nose: Nose normal. No congestion or rhinorrhea.     Mouth/Throat:     Mouth: Mucous membranes are moist.     Pharynx: Oropharynx is clear. No oropharyngeal exudate or posterior oropharyngeal erythema.  Eyes:     General: No scleral icterus.       Right eye: No discharge.        Left eye: No discharge.     Extraocular Movements: Extraocular movements intact.     Conjunctiva/sclera: Conjunctivae normal.     Pupils: Pupils are equal, round, and reactive to light.  Neck:     Vascular: No carotid bruit.  Cardiovascular:     Rate and Rhythm: Normal rate  and regular rhythm.      Pulses: Normal pulses.     Heart sounds: Normal heart sounds. No murmur heard.    No friction rub. No gallop.  Pulmonary:     Effort: Pulmonary effort is normal. No respiratory distress.     Breath sounds: Normal breath sounds. No stridor. No wheezing, rhonchi or rales.  Chest:     Chest wall: No tenderness.  Abdominal:     General: Bowel sounds are normal. There is no distension.     Palpations: Abdomen is soft. There is no hepatomegaly, splenomegaly or mass.     Tenderness: There is abdominal tenderness in the right upper quadrant. There is no right CVA tenderness, left CVA tenderness, guarding or rebound.     Hernia: No hernia is present.     Comments: Tenderness in the right upper quadrant Mild hepatomegaly  Musculoskeletal:        General: No swelling, tenderness, deformity or signs of injury. Normal range of motion.     Cervical back: Normal range of motion and neck supple. No rigidity or tenderness.     Right lower leg: No edema.     Left lower leg: No edema.  Lymphadenopathy:     Cervical: No cervical adenopathy.  Skin:    General: Skin is warm and dry.     Coloration: Skin is not jaundiced or pale.     Findings: No bruising, erythema, lesion or rash.  Neurological:     General: No focal deficit present.     Mental Status: He is alert and oriented to person, place, and time. Mental status is at baseline.     Cranial Nerves: No cranial nerve deficit.     Sensory: No sensory deficit.     Motor: No weakness.     Coordination: Coordination normal.     Gait: Gait normal.     Deep Tendon Reflexes: Reflexes normal.  Psychiatric:        Mood and Affect: Mood normal.        Behavior: Behavior normal.        Thought Content: Thought content normal.        Judgment: Judgment normal.    LABS:      Latest Ref Rng & Units 01/20/2024    9:34 AM 12/10/2023    5:24 AM 12/06/2023    5:55 AM  CBC  WBC 4.0 - 10.5 K/uL 3.8  4.3  5.4   Hemoglobin 13.0 - 17.0 g/dL 16.1  09.6  04.5    Hematocrit 39.0 - 52.0 % 35.8  35.3  37.9   Platelets 150 - 400 K/uL 309  226  223       Latest Ref Rng & Units 01/20/2024    9:34 AM 12/11/2023    9:54 AM 12/10/2023    5:24 AM  CMP  Glucose 70 - 99 mg/dL 89  409  91   BUN 8 - 23 mg/dL 13  27  36   Creatinine 0.61 - 1.24 mg/dL 8.11  9.14  7.82   Sodium 135 - 145 mmol/L 132  134  134   Potassium 3.5 - 5.1 mmol/L 3.8  3.7  3.3   Chloride 98 - 111 mmol/L 98  101  99   CO2 22 - 32 mmol/L 23  23  23    Calcium 8.9 - 10.3 mg/dL 8.6  9.1  8.9   Total Protein 6.5 - 8.1 g/dL 6.5     Total Bilirubin 0.0 -  1.2 mg/dL 0.5     Alkaline Phos 38 - 126 U/L 85     AST 15 - 41 U/L 33     ALT 0 - 44 U/L 11      Lab Results  Component Value Date   CEA1 7.9 (H) 07/17/2023   CEA 1.11 01/20/2024     CEA  Date Value Ref Range Status  07/17/2023 7.9 (H) 0.0 - 4.7 ng/mL Final    Comment:    (NOTE)                             Nonsmokers          <3.9                             Smokers             <5.6 [employer] Electrochemiluminescence Immunoassay (ECLIA) Values obtained with different assay methods or kits cannot be used interchangeably.  Results cannot be interpreted as absolute evidence of the presence or absence of malignant disease. Performed At: [facility] [address] [place], [place] [id number] Pearlean Botts MD WU:[id number]    CEA [facility])  Date Value Ref Range Status  01/20/2024 1.11 0.00 - 5.00 ng/mL Final    Comment:    (NOTE) This test was performed using [employer] Coul[employer]'s paramagnetic chemiluminescent immunoassay. Values obtained from different assay methods cannot be used interchangeably. Please note that up to 8% of patients who smoke may see values 5.1-10.0 ng/ml and 1% of patients who smoke may see CEA levels >10.0 ng/ml. Performed at [facility], [address] [place], [place] [address]    Lab Results  Component Value Date   [id number] 01/20/2024   STUDIES:  EXAM:  10/21/2023 CHEST - 2 VIEW IMPRESSION: Stable small right pleural effusion and mild right basilar atelectasis. No new or progressive disease.  EXAM: 07/17/2023 CT CHEST, ABDOMEN, AND PELVIS WITH CONTRAST  IMPRESSION: 1. Status post left upper lobectomy, without recurrent or metastatic disease. 2. Since 04/29/2023 abdominal CT, persistent or recurrent right lower lobe ground-glass with anterior right lower lobe clustered nodularity. All favored to be infectious. 3. New bilateral pleural effusions and trace left abdominal ascites, suggesting fluid overload. 4. Status post Whipple procedure without acute superimposed process. 5. Incidental findings, including: Coronary artery atherosclerosis. Aortic Atherosclerosis (ICD10-I70.0). Possible constipation.   HISTORY:   Allergies:  Allergies  Allergen Reactions   Pneumococcal Vaccines Swelling    Current Medications: Current Outpatient Medications  Medication Sig Dispense Refill   albuterol  (PROVENTIL  HFA;VENTOLIN  HFA) 108 (90 BASE) MCG/ACT inhaler Inhale 2 puffs into the lungs every 6 (six) hours as needed for wheezing or shortness of breath.     allopurinol  (ZYLOPRIM ) 300 MG tablet Take 300 mg by mouth daily.     aspirin  EC 81 MG tablet Take 1 tablet (81 mg total) by mouth daily. Swallow whole.     Calcium Carbonate (CALCIUM 500 PO) Take 1 tablet by mouth 3 (three) times daily.     Cholecalciferol (VITAMIN D ) 50 MCG (2000 UT) tablet Take 2,000 Units by mouth daily.     dasatinib  (SPRYCEL ) 100 MG tablet Take 1 tablet (100 mg total) by mouth daily. 30 tablet 5   diclofenac Sodium (VOLTAREN) 1 % GEL Apply 2 g topically at bedtime as needed (Pain).     fluticasone  (FLONASE ) 50 MCG/ACT nasal spray Place 2  sprays into both nostrils daily.     folic acid  (FOLVITE ) 1 MG tablet Take 1 tablet (1 mg total) by mouth daily.     HYDROcodone -acetaminophen  (NORCO/VICODIN) 5-325 MG tablet Take 1 tablet by mouth 4 (four) times daily as needed for  moderate pain (pain score 4-6).     methocarbamol  (ROBAXIN ) 750 MG tablet Take 750 mg by mouth every 6 (six) hours as needed for muscle spasms.     midodrine  (PROAMATINE ) 5 MG tablet Take 3 tablets (15 mg total) by mouth 3 (three) times daily with meals. 90 tablet 1   Multiple Vitamin (MULTIVITAMIN WITH MINERALS) TABS tablet Take 1 tablet by mouth daily.     omeprazole  (PRILOSEC) 40 MG capsule TAKE 1 CAPSULE (40 MG TOTAL) BY MOUTH 2 (TWO) TIMES DAILY BEFORE A MEAL. 180 capsule 0   polyethylene glycol (MIRALAX  / GLYCOLAX ) 17 g packet Take 17 g by mouth daily as needed for moderate constipation. 14 each 0   tamsulosin  (FLOMAX ) 0.4 MG CAPS capsule Take 0.8 mg by mouth daily.     thiamine  (VITAMIN B-1) 100 MG tablet Take 1 tablet (100 mg total) by mouth daily.     TRELEGY ELLIPTA 100-62.5-25 MCG/INH AEPB Inhale 1 puff into the lungs daily at 6 (six) AM.     No current facility-administered medications for this visit.    I,Jasmine M Lassiter,acting as a scribe for Nolia Baumgartner, MD.,have documented all relevant documentation on the behalf of Nolia Baumgartner, MD,as directed by  Nolia Baumgartner, MD while in the presence of Nolia Baumgartner, MD.  I have reviewed this report as typed by the medical scribe, and it is complete and accurate.

## 2024-02-05 ENCOUNTER — Telehealth: Payer: Self-pay | Admitting: Oncology

## 2024-02-05 ENCOUNTER — Emergency Department (HOSPITAL_COMMUNITY)

## 2024-02-05 ENCOUNTER — Other Ambulatory Visit: Payer: Self-pay

## 2024-02-05 ENCOUNTER — Inpatient Hospital Stay (HOSPITAL_COMMUNITY)
Admission: EM | Admit: 2024-02-05 | Discharge: 2024-02-19 | DRG: 871 | Disposition: A | Discharge status: Skilled Nursing Facility | Attending: Internal Medicine | Admitting: Internal Medicine

## 2024-02-05 ENCOUNTER — Encounter (HOSPITAL_COMMUNITY): Payer: Self-pay

## 2024-02-05 ENCOUNTER — Inpatient Hospital Stay: Admitting: Oncology

## 2024-02-05 DIAGNOSIS — Z66 Do not resuscitate: Secondary | ICD-10-CM | POA: Diagnosis not present

## 2024-02-05 DIAGNOSIS — F10131 Alcohol abuse with withdrawal delirium: Secondary | ICD-10-CM | POA: Diagnosis not present

## 2024-02-05 DIAGNOSIS — R4189 Other symptoms and signs involving cognitive functions and awareness: Secondary | ICD-10-CM | POA: Diagnosis present

## 2024-02-05 DIAGNOSIS — Z8042 Family history of malignant neoplasm of prostate: Secondary | ICD-10-CM

## 2024-02-05 DIAGNOSIS — E1169 Type 2 diabetes mellitus with other specified complication: Secondary | ICD-10-CM | POA: Diagnosis present

## 2024-02-05 DIAGNOSIS — E785 Hyperlipidemia, unspecified: Secondary | ICD-10-CM | POA: Diagnosis present

## 2024-02-05 DIAGNOSIS — Z825 Family history of asthma and other chronic lower respiratory diseases: Secondary | ICD-10-CM

## 2024-02-05 DIAGNOSIS — Z8711 Personal history of peptic ulcer disease: Secondary | ICD-10-CM

## 2024-02-05 DIAGNOSIS — I441 Atrioventricular block, second degree: Secondary | ICD-10-CM | POA: Diagnosis present

## 2024-02-05 DIAGNOSIS — E440 Moderate protein-calorie malnutrition: Secondary | ICD-10-CM | POA: Insufficient documentation

## 2024-02-05 DIAGNOSIS — E876 Hypokalemia: Secondary | ICD-10-CM | POA: Diagnosis not present

## 2024-02-05 DIAGNOSIS — F1010 Alcohol abuse, uncomplicated: Secondary | ICD-10-CM

## 2024-02-05 DIAGNOSIS — J690 Pneumonitis due to inhalation of food and vomit: Secondary | ICD-10-CM | POA: Diagnosis present

## 2024-02-05 DIAGNOSIS — E1151 Type 2 diabetes mellitus with diabetic peripheral angiopathy without gangrene: Secondary | ICD-10-CM | POA: Diagnosis present

## 2024-02-05 DIAGNOSIS — E1122 Type 2 diabetes mellitus with diabetic chronic kidney disease: Secondary | ICD-10-CM | POA: Diagnosis present

## 2024-02-05 DIAGNOSIS — Z515 Encounter for palliative care: Secondary | ICD-10-CM | POA: Diagnosis not present

## 2024-02-05 DIAGNOSIS — I130 Hypertensive heart and chronic kidney disease with heart failure and stage 1 through stage 4 chronic kidney disease, or unspecified chronic kidney disease: Secondary | ICD-10-CM | POA: Diagnosis present

## 2024-02-05 DIAGNOSIS — M109 Gout, unspecified: Secondary | ICD-10-CM | POA: Diagnosis present

## 2024-02-05 DIAGNOSIS — J4489 Other specified chronic obstructive pulmonary disease: Secondary | ICD-10-CM | POA: Diagnosis not present

## 2024-02-05 DIAGNOSIS — I493 Ventricular premature depolarization: Secondary | ICD-10-CM | POA: Diagnosis present

## 2024-02-05 DIAGNOSIS — I2510 Atherosclerotic heart disease of native coronary artery without angina pectoris: Secondary | ICD-10-CM | POA: Diagnosis present

## 2024-02-05 DIAGNOSIS — K7010 Alcoholic hepatitis without ascites: Secondary | ICD-10-CM | POA: Diagnosis present

## 2024-02-05 DIAGNOSIS — J189 Pneumonia, unspecified organism: Secondary | ICD-10-CM | POA: Diagnosis present

## 2024-02-05 DIAGNOSIS — Z7189 Other specified counseling: Secondary | ICD-10-CM | POA: Diagnosis not present

## 2024-02-05 DIAGNOSIS — Z860101 Personal history of adenomatous and serrated colon polyps: Secondary | ICD-10-CM

## 2024-02-05 DIAGNOSIS — I509 Heart failure, unspecified: Secondary | ICD-10-CM | POA: Diagnosis not present

## 2024-02-05 DIAGNOSIS — I10 Essential (primary) hypertension: Secondary | ICD-10-CM | POA: Diagnosis present

## 2024-02-05 DIAGNOSIS — A419 Sepsis, unspecified organism: Principal | ICD-10-CM | POA: Diagnosis present

## 2024-02-05 DIAGNOSIS — Z887 Allergy status to serum and vaccine status: Secondary | ICD-10-CM

## 2024-02-05 DIAGNOSIS — D509 Iron deficiency anemia, unspecified: Secondary | ICD-10-CM | POA: Diagnosis present

## 2024-02-05 DIAGNOSIS — K760 Fatty (change of) liver, not elsewhere classified: Secondary | ICD-10-CM | POA: Diagnosis present

## 2024-02-05 DIAGNOSIS — M47812 Spondylosis without myelopathy or radiculopathy, cervical region: Secondary | ICD-10-CM | POA: Diagnosis present

## 2024-02-05 DIAGNOSIS — N179 Acute kidney failure, unspecified: Secondary | ICD-10-CM | POA: Diagnosis present

## 2024-02-05 DIAGNOSIS — Z8505 Personal history of malignant neoplasm of liver: Secondary | ICD-10-CM

## 2024-02-05 DIAGNOSIS — C249 Malignant neoplasm of biliary tract, unspecified: Secondary | ICD-10-CM | POA: Diagnosis present

## 2024-02-05 DIAGNOSIS — Z634 Disappearance and death of family member: Secondary | ICD-10-CM

## 2024-02-05 DIAGNOSIS — I7121 Aneurysm of the ascending aorta, without rupture: Secondary | ICD-10-CM | POA: Diagnosis present

## 2024-02-05 DIAGNOSIS — E871 Hypo-osmolality and hyponatremia: Secondary | ICD-10-CM | POA: Diagnosis present

## 2024-02-05 DIAGNOSIS — I959 Hypotension, unspecified: Secondary | ICD-10-CM | POA: Diagnosis not present

## 2024-02-05 DIAGNOSIS — I159 Secondary hypertension, unspecified: Secondary | ICD-10-CM | POA: Diagnosis not present

## 2024-02-05 DIAGNOSIS — Z7951 Long term (current) use of inhaled steroids: Secondary | ICD-10-CM

## 2024-02-05 DIAGNOSIS — C9210 Chronic myeloid leukemia, BCR/ABL-positive, not having achieved remission: Secondary | ICD-10-CM | POA: Diagnosis present

## 2024-02-05 DIAGNOSIS — J449 Chronic obstructive pulmonary disease, unspecified: Secondary | ICD-10-CM

## 2024-02-05 DIAGNOSIS — R1312 Dysphagia, oropharyngeal phase: Secondary | ICD-10-CM | POA: Diagnosis present

## 2024-02-05 DIAGNOSIS — T501X6A Underdosing of loop [high-ceiling] diuretics, initial encounter: Secondary | ICD-10-CM | POA: Diagnosis present

## 2024-02-05 DIAGNOSIS — I480 Paroxysmal atrial fibrillation: Secondary | ICD-10-CM | POA: Diagnosis present

## 2024-02-05 DIAGNOSIS — R1310 Dysphagia, unspecified: Secondary | ICD-10-CM | POA: Diagnosis not present

## 2024-02-05 DIAGNOSIS — Z8509 Personal history of malignant neoplasm of other digestive organs: Secondary | ICD-10-CM | POA: Diagnosis present

## 2024-02-05 DIAGNOSIS — I4510 Unspecified right bundle-branch block: Secondary | ICD-10-CM | POA: Diagnosis present

## 2024-02-05 DIAGNOSIS — J440 Chronic obstructive pulmonary disease with acute lower respiratory infection: Secondary | ICD-10-CM | POA: Diagnosis present

## 2024-02-05 DIAGNOSIS — F10939 Alcohol use, unspecified with withdrawal, unspecified: Secondary | ICD-10-CM

## 2024-02-05 DIAGNOSIS — Z8249 Family history of ischemic heart disease and other diseases of the circulatory system: Secondary | ICD-10-CM

## 2024-02-05 DIAGNOSIS — Z91148 Patient's other noncompliance with medication regimen for other reason: Secondary | ICD-10-CM

## 2024-02-05 DIAGNOSIS — N400 Enlarged prostate without lower urinary tract symptoms: Secondary | ICD-10-CM | POA: Diagnosis present

## 2024-02-05 DIAGNOSIS — Z6825 Body mass index (BMI) 25.0-25.9, adult: Secondary | ICD-10-CM

## 2024-02-05 DIAGNOSIS — R6521 Severe sepsis with septic shock: Secondary | ICD-10-CM | POA: Diagnosis present

## 2024-02-05 DIAGNOSIS — J90 Pleural effusion, not elsewhere classified: Secondary | ICD-10-CM

## 2024-02-05 DIAGNOSIS — Z7982 Long term (current) use of aspirin: Secondary | ICD-10-CM

## 2024-02-05 DIAGNOSIS — F17210 Nicotine dependence, cigarettes, uncomplicated: Secondary | ICD-10-CM | POA: Diagnosis present

## 2024-02-05 DIAGNOSIS — E7800 Pure hypercholesterolemia, unspecified: Secondary | ICD-10-CM | POA: Diagnosis present

## 2024-02-05 DIAGNOSIS — I5033 Acute on chronic diastolic (congestive) heart failure: Secondary | ICD-10-CM

## 2024-02-05 DIAGNOSIS — Z83719 Family history of colon polyps, unspecified: Secondary | ICD-10-CM

## 2024-02-05 DIAGNOSIS — Z79899 Other long term (current) drug therapy: Secondary | ICD-10-CM

## 2024-02-05 DIAGNOSIS — Z85118 Personal history of other malignant neoplasm of bronchus and lung: Secondary | ICD-10-CM

## 2024-02-05 DIAGNOSIS — R600 Localized edema: Secondary | ICD-10-CM

## 2024-02-05 DIAGNOSIS — E860 Dehydration: Secondary | ICD-10-CM | POA: Diagnosis present

## 2024-02-05 DIAGNOSIS — Z7969 Long term (current) use of other immunomodulators and immunosuppressants: Secondary | ICD-10-CM

## 2024-02-05 DIAGNOSIS — R570 Cardiogenic shock: Secondary | ICD-10-CM | POA: Diagnosis present

## 2024-02-05 DIAGNOSIS — R0609 Other forms of dyspnea: Secondary | ICD-10-CM | POA: Diagnosis not present

## 2024-02-05 HISTORY — DX: Pneumonia, unspecified organism: J18.9

## 2024-02-05 LAB — I-STAT VENOUS BLOOD GAS, ED
Acid-base deficit: 1 mmol/L (ref 0.0–2.0)
Bicarbonate: 24.1 mmol/L (ref 20.0–28.0)
Calcium, Ion: 0.98 mmol/L — ABNORMAL LOW (ref 1.15–1.40)
HCT: 40 % (ref 39.0–52.0)
Hemoglobin: 13.6 g/dL (ref 13.0–17.0)
O2 Saturation: 54 %
Potassium: 3.3 mmol/L — ABNORMAL LOW (ref 3.5–5.1)
Sodium: 122 mmol/L — ABNORMAL LOW (ref 135–145)
TCO2: 25 mmol/L (ref 22–32)
pCO2, Ven: 39.4 mmHg — ABNORMAL LOW (ref 44–60)
pH, Ven: 7.396 (ref 7.25–7.43)
pO2, Ven: 28 mmHg — CL (ref 32–45)

## 2024-02-05 LAB — CBC WITH DIFFERENTIAL/PLATELET
Abs Immature Granulocytes: 0.03 10*3/uL (ref 0.00–0.07)
Basophils Absolute: 0 10*3/uL (ref 0.0–0.1)
Basophils Relative: 0 %
Eosinophils Absolute: 0 10*3/uL (ref 0.0–0.5)
Eosinophils Relative: 0 %
HCT: 38 % — ABNORMAL LOW (ref 39.0–52.0)
Hemoglobin: 13.6 g/dL (ref 13.0–17.0)
Immature Granulocytes: 0 %
Lymphocytes Relative: 14 %
Lymphs Abs: 1 10*3/uL (ref 0.7–4.0)
MCH: 35.2 pg — ABNORMAL HIGH (ref 26.0–34.0)
MCHC: 35.8 g/dL (ref 30.0–36.0)
MCV: 98.4 fL (ref 80.0–100.0)
Monocytes Absolute: 0.9 10*3/uL (ref 0.1–1.0)
Monocytes Relative: 13 %
Neutro Abs: 5 10*3/uL (ref 1.7–7.7)
Neutrophils Relative %: 73 %
Platelets: 286 10*3/uL (ref 150–400)
RBC: 3.86 MIL/uL — ABNORMAL LOW (ref 4.22–5.81)
RDW: 14.6 % (ref 11.5–15.5)
WBC: 6.9 10*3/uL (ref 4.0–10.5)
nRBC: 0 % (ref 0.0–0.2)

## 2024-02-05 LAB — COMPREHENSIVE METABOLIC PANEL WITH GFR
ALT: 26 U/L (ref 0–44)
AST: 61 U/L — ABNORMAL HIGH (ref 15–41)
Albumin: 2.4 g/dL — ABNORMAL LOW (ref 3.5–5.0)
Alkaline Phosphatase: 69 U/L (ref 38–126)
Anion gap: 12 (ref 5–15)
BUN: 25 mg/dL — ABNORMAL HIGH (ref 8–23)
CO2: 24 mmol/L (ref 22–32)
Calcium: 7.7 mg/dL — ABNORMAL LOW (ref 8.9–10.3)
Chloride: 87 mmol/L — ABNORMAL LOW (ref 98–111)
Creatinine, Ser: 1.5 mg/dL — ABNORMAL HIGH (ref 0.61–1.24)
GFR, Estimated: 49 mL/min — ABNORMAL LOW (ref >60)
Glucose, Bld: 89 mg/dL (ref 70–99)
Potassium: 3.4 mmol/L — ABNORMAL LOW (ref 3.5–5.1)
Sodium: 123 mmol/L — ABNORMAL LOW (ref 135–145)
Total Bilirubin: 0.7 mg/dL (ref 0.0–1.2)
Total Protein: 5.7 g/dL — ABNORMAL LOW (ref 6.5–8.1)

## 2024-02-05 LAB — I-STAT CG4 LACTIC ACID, ED: Lactic Acid, Venous: 1.9 mmol/L (ref 0.5–1.9)

## 2024-02-05 LAB — URINALYSIS, ROUTINE W REFLEX MICROSCOPIC
Bilirubin Urine: NEGATIVE
Glucose, UA: NEGATIVE mg/dL
Hgb urine dipstick: NEGATIVE
Ketones, ur: NEGATIVE mg/dL
Leukocytes,Ua: NEGATIVE
Nitrite: NEGATIVE
Protein, ur: NEGATIVE mg/dL
Specific Gravity, Urine: 1.005 (ref 1.005–1.030)
pH: 6 (ref 5.0–8.0)

## 2024-02-05 LAB — TROPONIN I (HIGH SENSITIVITY)
Troponin I (High Sensitivity): 55 ng/L — ABNORMAL HIGH (ref <18)
Troponin I (High Sensitivity): 55 ng/L — ABNORMAL HIGH (ref <18)

## 2024-02-05 LAB — MAGNESIUM: Magnesium: 1.9 mg/dL (ref 1.7–2.4)

## 2024-02-05 LAB — BRAIN NATRIURETIC PEPTIDE: B Natriuretic Peptide: 149.5 pg/mL — ABNORMAL HIGH (ref 0.0–100.0)

## 2024-02-05 MED ORDER — FOLIC ACID 1 MG PO TABS
1.0000 mg | ORAL_TABLET | Freq: Every day | ORAL | Status: DC
  Administered 2024-02-06 – 2024-02-11 (×3): 1 mg via ORAL
  Filled 2024-02-05 (×4): qty 1

## 2024-02-05 MED ORDER — HEPARIN (PORCINE) 25000 UT/250ML-% IV SOLN
1100.0000 [IU]/h | INTRAVENOUS | Status: DC
  Administered 2024-02-06: 1200 [IU]/h via INTRAVENOUS
  Administered 2024-02-06 – 2024-02-07 (×2): 1100 [IU]/h via INTRAVENOUS
  Filled 2024-02-05 (×3): qty 250

## 2024-02-05 MED ORDER — THIAMINE MONONITRATE 100 MG PO TABS: 100.0000 mg | ORAL_TABLET | Freq: Every day | ORAL | Status: DC

## 2024-02-05 MED ORDER — SODIUM CHLORIDE 0.9 % IV SOLN
500.0000 mg | Freq: Once | INTRAVENOUS | Status: AC
  Administered 2024-02-05: 500 mg via INTRAVENOUS
  Filled 2024-02-05: qty 5

## 2024-02-05 MED ORDER — DOCUSATE SODIUM 100 MG PO CAPS: 100.0000 mg | ORAL_CAPSULE | Freq: Two times a day (BID) | ORAL | Status: DC | PRN

## 2024-02-05 MED ORDER — MIDODRINE HCL 5 MG PO TABS
10.0000 mg | ORAL_TABLET | Freq: Three times a day (TID) | ORAL | Status: DC
  Administered 2024-02-06 – 2024-02-11 (×12): 10 mg via ORAL
  Filled 2024-02-05 (×16): qty 2

## 2024-02-05 MED ORDER — MIDODRINE HCL 5 MG PO TABS
10.0000 mg | ORAL_TABLET | Freq: Once | ORAL | Status: AC
  Administered 2024-02-05: 10 mg via ORAL
  Filled 2024-02-05: qty 2

## 2024-02-05 MED ORDER — SODIUM CHLORIDE 0.9 % IV SOLN: 1.0000 g | INTRAVENOUS | Status: DC

## 2024-02-05 MED ORDER — SODIUM CHLORIDE 0.9 % IV SOLN
1.0000 g | Freq: Once | INTRAVENOUS | Status: AC
  Administered 2024-02-05: 1 g via INTRAVENOUS
  Filled 2024-02-05: qty 10

## 2024-02-05 MED ORDER — SODIUM CHLORIDE 0.9 % IV BOLUS
500.0000 mL | Freq: Once | INTRAVENOUS | Status: AC
  Administered 2024-02-05: 500 mL via INTRAVENOUS

## 2024-02-05 MED ORDER — HEPARIN BOLUS VIA INFUSION
4000.0000 [IU] | Freq: Once | INTRAVENOUS | Status: AC
Start: 2024-02-05 — End: 2024-02-06
  Administered 2024-02-06: 4000 [IU] via INTRAVENOUS
  Filled 2024-02-05: qty 4000

## 2024-02-05 MED ORDER — FUROSEMIDE 10 MG/ML IJ SOLN: 60.0000 mg | Freq: Once | INTRAMUSCULAR | Status: DC

## 2024-02-05 MED ORDER — ONDANSETRON HCL 4 MG/2ML IJ SOLN: 4.0000 mg | Freq: Four times a day (QID) | INTRAMUSCULAR | Status: DC | PRN

## 2024-02-05 MED ORDER — LACTATED RINGERS IV BOLUS
500.0000 mL | Freq: Once | INTRAVENOUS | Status: AC
  Administered 2024-02-05: 500 mL via INTRAVENOUS

## 2024-02-05 MED ORDER — SODIUM CHLORIDE 0.9 % IV SOLN
250.0000 mL | INTRAVENOUS | Status: AC
Start: 2024-02-05 — End: 2024-02-06
  Administered 2024-02-06: 250 mL via INTRAVENOUS

## 2024-02-05 MED ORDER — NOREPINEPHRINE 4 MG/250ML-% IV SOLN
0.0000 ug/min | INTRAVENOUS | Status: DC
  Administered 2024-02-05: 2 ug/min via INTRAVENOUS
  Administered 2024-02-06: 3 ug/min via INTRAVENOUS
  Administered 2024-02-06: 8 ug/min via INTRAVENOUS
  Filled 2024-02-05 (×3): qty 250

## 2024-02-05 MED ORDER — INSULIN ASPART 100 UNIT/ML IJ SOLN
0.0000 [IU] | INTRAMUSCULAR | Status: DC
  Administered 2024-02-06: 3 [IU] via SUBCUTANEOUS
  Administered 2024-02-06 – 2024-02-08 (×3): 2 [IU] via SUBCUTANEOUS

## 2024-02-05 MED ORDER — ALBUMIN HUMAN 25 % IV SOLN
12.5000 g | Freq: Once | INTRAVENOUS | Status: AC
  Administered 2024-02-06: 12.5 g via INTRAVENOUS
  Filled 2024-02-05: qty 50

## 2024-02-05 MED ORDER — POLYETHYLENE GLYCOL 3350 17 G PO PACK: 17.0000 g | PACK | Freq: Every day | ORAL | Status: DC | PRN

## 2024-02-05 NOTE — Consult Note (Signed)
 NAME:  Kyle Montoya, MRN:  [id number], DOB:  1951/05/20, LOS: 0 ADMISSION DATE:  02/05/2024, CONSULTATION DATE:  02/05/2024 REFERRING MD:  Pearletha Bouche, CHIEF COMPLAINT: Generalized weakness  History of Present Illness:  73 year old male with chronic HFpEF, paroxysmal A-fib and prior history of lung cancer, CML and cholangiocarcinoma was brought into the emergency department with a complaint of generalized weakness and fatigue for last few days associated bilateral leg edema.  Patient stated he has not been taking midodrine  which was prescribed to him for chronic low blood pressure.  He denies chest pain, shortness of breath, nausea, vomiting, palpitation or other complaints  Of note patient was admitted to [facility] in March with same sort of complaint, he was treated for acute on chronic HFpEF and was discharged   Patient received 500 cc of IV fluid in the emergency department, his blood pressure improved to 90/60 with MAP in 70s, PCCM was consulted for help evaluation medical management  Pertinent  Medical History   Past Medical History:  Diagnosis Date   Abnormal colonoscopy 02/23/2021   Abnormal transaminases 02/27/2022   Acute diastolic (congestive) heart failure (HCC) 10/08/2023   Adenomatous polyp of descending colon 02/23/2021   Adverse drug reaction, initial encounter 07/04/2016   07/02/2016: Prevnar 23 vaccine given in right arm  07/02/2016: localized swelling  10/11/20167: Increase in swelling from shoulder to inner aspect of elbow.Red, warm and firm to touch.  No lip or airway swelling or stridor.  2+ brachial and radial pulses right arm.  Capillary refill < 2 seconds     Anemia in chronic illness 01/22/2013   Arthritis, degenerative 01/31/2015   Ascending aortic aneurysm (HCC) 06/27/2022   Benign fibroma of prostate 01/31/2015   BP (high blood pressure) 01/22/2013   BPH (benign prostatic hyperplasia)    CAD (coronary artery disease)    Cannot sleep  01/31/2015   Carcinoma of biliary tract (HCC) 02/15/2013   Diagnosed in 2014, Pt had whipple done at [facility], under care by Dr. Kirk Peper Oncologist, has f/u every 6-12 months.   Cataract    bilateral sx   Cervical osteoarthritis 10/18/2014   Cholangiocarcinoma of biliary tract (HCC)    Chronic cough 01/31/2015   Chronic myelogenous leukemia (HCC)    Chronic pain 01/31/2015   CML (chronic myelocytic leukemia) (HCC) 10/18/2014   Overview:   Follows with [facility], Dr. Almer Jacobson. On dasatinib , last WBC 9.6.     Compulsive tobacco user syndrome 01/31/2015   Congestive heart failure (CHF) (HCC) 09/26/2023   COPD (chronic obstructive pulmonary disease) (HCC)    uses inhaler   COPD with asthma (HCC) 01/22/2013   Severe obstruction on spirometry FeV1 54% FeV1/FVC 51% 01/31/2015  CXR NAD 01/25/15     Diuretic-induced hypokalemia 05/23/2023   DJD (degenerative joint disease)    Dyslipidemia    Family history of colonic polyps    Family history of prostate cancer    Full thickness rotator cuff tear 08/08/2020   Gout    High coronary artery calcium score 05/30/2023   History of colon polyps 02/23/2021   History of colonic polyps 10/01/2021   IMO SNOMED Dx Update Oct 2024     HLD (hyperlipidemia) 01/31/2015   HTN (hypertension)    on meds   Hypercalcemia 08/10/2020   Hypercholesterolemia    Hypocalcemia 08/27/2021   Hyponatremia 09/19/2023   Impingement syndrome of both shoulders 03/31/2020   Lung cancer (HCC)    Myelogenous leukemia (HCC)    Non-small  cell carcinoma of lung (HCC) 01/31/2015   02/13/2015 CT Chest no evidence of recurrence in lung cancer  Stage I adenocarcinoma diagnosed with left upper lobectomy in February 2016     OA (osteoarthritis)    Orthostatic hypotension 10/10/2023   PAD (peripheral artery disease) (HCC)    Personal history of gastric ulcers 02/23/2021   Renal insufficiency    Shoulder pain, left 03/29/2020   Shoulder pain, right 03/29/2020    Tendonitis    patellar   Thoracic aortic aneurysm (HCC) 06/27/2022   Wheezing 01/31/2015     Significant Hospital Events: Including procedures, antibiotic start and stop dates in addition to other pertinent events     Interim History / Subjective:  As above  Objective    Blood pressure (!) 90/53, pulse 60, temperature (!) 97.5 F (36.4 C), resp. rate 19, SpO2 97%.       No intake or output data in the 24 hours ending 02/05/24 1827 There were no vitals filed for this visit.  Examination: General: Acute on chronically ill-appearing elderly male, lying on the bed HEENT: [place]/AT, eyes anicteric.  moist mucus membranes Neuro: Alert, awake following commands Chest: Coarse breath sounds, no wheezes or rhonchi Heart: Irregularly irregular, no murmurs or gallops.  1+ pitting edema noted in bilateral lower extremities Abdomen: Soft, nontender, nondistended, bowel sounds present  Labs and images reviewed   Resolved problem list    Assessment and Plan  Acute on chronic HFpEF Chronic hypotension, noncompliant with midodrine  Hypervolemic hyponatremia Hypokalemia/hypocalcemia Acute kidney injury due to cardiorenal syndrome Alcohol abuse COPD  Patient presented with generalized weakness I think he is in acute on chronic diastolic heart failure Resume diuretics and midodrine  for chronic low blood pressure Cardiology consult He has hypervolemic hyponatremia which is poor prognostic sign CHF, closely monitor electrolytes Continue supplements Monitor intake and output Avoid nephrotoxic agent Continue thiamine  and CIWA protocol Continue inhalers  At this time patient does not need ICU level of care, please call with questions  Best Practice (right click and "Reselect all SmartList Selections" daily)  Per primary team  Labs   CBC: Recent Labs  Lab 02/05/24 1524 02/05/24 1554  WBC 6.9  --   NEUTROABS 5.0  --   HGB 13.6 13.6  HCT 38.0* 40.0  MCV 98.4  --   PLT 286  --      Basic Metabolic Panel: Recent Labs  Lab 02/05/24 1524 02/05/24 1554  NA 123* 122*  K 3.4* 3.3*  CL 87*  --   CO2 24  --   GLUCOSE 89  --   BUN 25*  --   CREATININE 1.50*  --   CALCIUM 7.7*  --    GFR: CrCl cannot be calculated (Unknown ideal weight.). Recent Labs  Lab 02/05/24 1524  WBC 6.9    Liver Function Tests: Recent Labs  Lab 02/05/24 1524  AST 61*  ALT 26  ALKPHOS 69  BILITOT 0.7  PROT 5.7*  ALBUMIN  2.4*   No results for input(s): "LIPASE", "AMYLASE" in the last 168 hours. No results for input(s): "AMMONIA" in the last 168 hours.  ABG    Component Value Date/Time   HCO3 24.1 02/05/2024 1554   TCO2 25 02/05/2024 1554   ACIDBASEDEF 1.0 02/05/2024 1554   O2SAT 54 02/05/2024 1554     Coagulation Profile: No results for input(s): "INR", "PROTIME" in the last 168 hours.  Cardiac Enzymes: No results for input(s): "CKTOTAL", "CKMB", "CKMBINDEX", "TROPONINI" in the last 168 hours.  HbA1C:  Hemoglobin A1C  Date/Time Value Ref Range Status  12/13/2022 12:00 AM 4.5  Final   Hgb A1c MFr Bld  Date/Time Value Ref Range Status  09/25/2020 09:17 AM 4.7 (L) 4.8 - 5.6 % Final    Comment:    (NOTE) Pre diabetes:          5.7%-6.4%  Diabetes:              >6.4%  Glycemic control for   <7.0% adults with diabetes     CBG: No results for input(s): "GLUCAP" in the last 168 hours.  Review of Systems:   12 point review of system is significant for complaint mentioned in HPI, rest negative  Past Medical History:  He,  has a past medical history of Abnormal colonoscopy (02/23/2021), Abnormal transaminases (02/27/2022), Acute diastolic (congestive) heart failure (HCC) (10/08/2023), Adenomatous polyp of descending colon (02/23/2021), Adverse drug reaction, initial encounter (07/04/2016), Anemia in chronic illness (01/22/2013), Arthritis, degenerative (01/31/2015), Ascending aortic aneurysm (HCC) (06/27/2022), Benign fibroma of prostate (01/31/2015), BP (high  blood pressure) (01/22/2013), BPH (benign prostatic hyperplasia), CAD (coronary artery disease), Cannot sleep (01/31/2015), Carcinoma of biliary tract (HCC) (02/15/2013), Cataract, Cervical osteoarthritis (10/18/2014), Cholangiocarcinoma of biliary tract (HCC), Chronic cough (01/31/2015), Chronic myelogenous leukemia (HCC), Chronic pain (01/31/2015), CML (chronic myelocytic leukemia) (HCC) (10/18/2014), Compulsive tobacco user syndrome (01/31/2015), Congestive heart failure (CHF) (HCC) (09/26/2023), COPD (chronic obstructive pulmonary disease) (HCC), COPD with asthma (HCC) (01/22/2013), Diuretic-induced hypokalemia (05/23/2023), DJD (degenerative joint disease), Dyslipidemia, Family history of colonic polyps, Family history of prostate cancer, Full thickness rotator cuff tear (08/08/2020), Gout, High coronary artery calcium score (05/30/2023), History of colon polyps (02/23/2021), History of colonic polyps (10/01/2021), HLD (hyperlipidemia) (01/31/2015), HTN (hypertension), Hypercalcemia (08/10/2020), Hypercholesterolemia, Hypocalcemia (08/27/2021), Hyponatremia (09/19/2023), Impingement syndrome of both shoulders (03/31/2020), Lung cancer (HCC), Myelogenous leukemia (HCC), Non-small cell carcinoma of lung (HCC) (01/31/2015), OA (osteoarthritis), Orthostatic hypotension (10/10/2023), PAD (peripheral artery disease) (HCC), Personal history of gastric ulcers (02/23/2021), Renal insufficiency, Shoulder pain, left (03/29/2020), Shoulder pain, right (03/29/2020), Tendonitis, Thoracic aortic aneurysm (HCC) (06/27/2022), and Wheezing (01/31/2015).   Surgical History:   Past Surgical History:  Procedure Laterality Date   BIOPSY  04/30/2021   Procedure: BIOPSY;  Surgeon: Brice Campi Albino Alu., MD;  Location: [facility];  Service: Gastroenterology;;   Bowel duct surg  2014   Bowel cancer   CARDIAC CATHETERIZATION  2000   CHOLECYSTECTOMY     COLONOSCOPY  06/23/2015   mild sigmoid diverticulosis. small external  and internal hemorrhoids. otherwise normal colonoscopy to terminal ileum   COLONOSCOPY WITH PROPOFOL  N/A 04/30/2021   Procedure: COLONOSCOPY WITH PROPOFOL ;  Surgeon: Brice Campi Albino Alu., MD;  Location: [facility];  Service: Gastroenterology;  Laterality: N/A;   ENDOSCOPIC MUCOSAL RESECTION N/A 04/30/2021   Procedure: ENDOSCOPIC MUCOSAL RESECTION;  Surgeon: Brice Campi Albino Alu., MD;  Location: [facility];  Service: Gastroenterology;  Laterality: N/A;   ESOPHAGOGASTRODUODENOSCOPY (EGD) WITH PROPOFOL  N/A 04/30/2021   Procedure: ESOPHAGOGASTRODUODENOSCOPY (EGD) WITH PROPOFOL ;  Surgeon: Brice Campi Albino Alu., MD;  Location: [facility];  Service: Gastroenterology;  Laterality: N/A;   HEMOSTASIS CLIP PLACEMENT  04/30/2021   Procedure: HEMOSTASIS CLIP PLACEMENT;  Surgeon: Normie Becton., MD;  Location: [facility];  Service: Gastroenterology;;   LUNG CANCER SURGERY  09/2014   partial lobectomy   POLYPECTOMY  04/30/2021   Procedure: POLYPECTOMY;  Surgeon: Mansouraty, Albino Alu., MD;  Location: [facility];  Service: Gastroenterology;;   SHOULDER OPEN ROTATOR CUFF REPAIR Right 09/27/2020   Procedure: Right shoulder mini open rotator cuff repair, distal clavicle  resection;  Surgeon: Orvan Blanch, MD;  Location: [facility];  Service: Orthopedics;  Laterality: Right;  90 mins  Choice with Block anesthesia   SUBMUCOSAL LIFTING INJECTION  04/30/2021   Procedure: SUBMUCOSAL LIFTING INJECTION;  Surgeon: Normie Becton., MD;  Location: [facility];  Service: Gastroenterology;;   UPPER GASTROINTESTINAL ENDOSCOPY     WHIPPLE PROCEDURE  02/25/2013   Procedure: WHIPPLE PROCEDURE; Surgeon: Kirk Peper, MD; Location: [facility]; Service: General; Laterality: N/A;     Social History:   reports that he has quit smoking. His smoking use included cigarettes. He has a 10 pack-year smoking history. He has never used smokeless tobacco. He reports current alcohol use of about 21.0 standard  drinks of alcohol per week. He reports that he does not use drugs.   Family History:  His family history includes Asthma in his sister; Cancer in his half-sister and paternal grandfather; Hypertension in his brother, mother, and sister; Prostate cancer (age of onset: 51) in his father. There is no history of Esophageal cancer, Colon polyps, Colon cancer, Stomach cancer, Rectal cancer, Inflammatory bowel disease, Liver disease, or Pancreatic cancer.   Allergies Allergies  Allergen Reactions   Pneumococcal Vaccines Swelling     Home Medications  Prior to Admission medications   Medication Sig Start Date End Date Taking? Authorizing Provider  albuterol  (PROVENTIL  HFA;VENTOLIN  HFA) 108 (90 BASE) MCG/ACT inhaler Inhale 2 puffs into the lungs every 6 (six) hours as needed for wheezing or shortness of breath.    [provider]  allopurinol  (ZYLOPRIM ) 300 MG tablet Take 300 mg by mouth daily.    [provider]  aspirin  EC 81 MG tablet Take 1 tablet (81 mg total) by mouth daily. Swallow whole. 05/30/23   Krasowski, Robert J, MD  Calcium Carbonate (CALCIUM 500 PO) Take 1 tablet by mouth 3 (three) times daily. 02/27/22   Mosher, Kelli A, PA-C  Cholecalciferol (VITAMIN D ) 50 MCG (2000 UT) tablet Take 2,000 Units by mouth daily.    [provider]  dasatinib  (SPRYCEL ) 100 MG tablet Take 1 tablet (100 mg total) by mouth daily. 01/13/24   Nolia Baumgartner, MD  diclofenac Sodium (VOLTAREN) 1 % GEL Apply 2 g topically at bedtime as needed (Pain). 07/10/20   [provider]  fluticasone  (FLONASE ) 50 MCG/ACT nasal spray Place 2 sprays into both nostrils daily.    [provider]  folic acid  (FOLVITE ) 1 MG tablet Take 1 tablet (1 mg total) by mouth daily. 12/12/23   Barbee Lew, MD  HYDROcodone -acetaminophen  (NORCO/VICODIN) 5-325 MG tablet Take 1 tablet by mouth 4 (four) times daily as needed for moderate pain (pain score 4-6). 09/27/23   [provider]  methocarbamol  (ROBAXIN ) 750 MG tablet Take 750 mg by mouth every 6 (six) hours as needed for muscle spasms. 04/07/23   [provider]  midodrine  (PROAMATINE ) 5 MG tablet Take 3 tablets (15 mg total) by mouth 3 (three) times daily with meals. 12/11/23   Barbee Lew, MD  Multiple Vitamin (MULTIVITAMIN WITH MINERALS) TABS tablet Take 1 tablet by mouth daily. 12/12/23   Barbee Lew, MD  omeprazole  (PRILOSEC) 40 MG capsule TAKE 1 CAPSULE (40 MG TOTAL) BY MOUTH 2 (TWO) TIMES DAILY BEFORE A MEAL. 11/21/23   Lajuan Pila, MD  polyethylene glycol (MIRALAX  / GLYCOLAX ) 17 g packet Take 17 g by mouth daily as needed for moderate constipation. 12/11/23   Barbee Lew, MD  tamsulosin  (FLOMAX ) 0.4 MG CAPS capsule Take  0.8 mg by mouth daily. 09/08/23   [provider]  thiamine  (VITAMIN B-1) 100 MG tablet Take 1 tablet (100 mg total) by mouth daily. 12/12/23   Barbee Lew, MD  TRELEGY ELLIPTA 100-62.5-25 MCG/INH AEPB Inhale 1 puff into the lungs daily at 6 (six) AM. 12/20/20   [provider]       Trevor Fudge, MD  Pulmonary Critical Care See Amion for pager If no response to pager, please call [phone] until 7pm After 7pm, Please call E-link [phone]

## 2024-02-05 NOTE — H&P (Signed)
 NAME:  Kyle Montoya, MRN:  [id number], DOB:  [birth year], LOS: 0 ADMISSION DATE:  02/05/2024, CONSULTATION DATE:  02/05/2024 REFERRING MD:  Elise Guile, DO, CHIEF COMPLAINT:  weakness and LE edema   History of Present Illness:  73 y/o who has a PMH for HFpEF (normal echo EF in December 2024), Alcohol abuse, BPH, Cervical DDD, Gout, Asthma, COPD, Hyponatremia, Pneumonia, HTN, Anemia, Ascending Aortic Aneurysm, Gastric Ulcer disease with H. Pylori, paroxysmal Atrial Fibrillation, Lung Cancer stage IA 2016 s/p resection, CML diagnosed 2015 (on Dastinib 100mg  daily)and Cholangiocarcinoma, B12 defeciency, Alcoholic Hepatitis who presented to ED with generalized weakness and increased LE edema. PCCM called as his BP remained low in the ED.  He was seen earlier by [facility] but not deemed for ICU admission. He drinks 1 pint of Vodka daily and about 3 beers daily. In January he was admitted with acute heart failure and his Lasix  was stopped and he was placed on Torsemide  20mg  daily. In March he was admitted for Hyponatremia. He has slightly lower sodium now than in March. His cxr shows b/l small pleural and possible RLL infiltrate. Pertinent  Medical History  HFpEF (normal echo EF in December 2024), Alcohol abuse, BPH, Cervical DDD, Gout, Asthma, COPD, Hyponatremia, Pneumonia, HTN, Anemia, Ascending Aortic Aneurysm, Gastric Ulcer disease with H. Pylori, paroxysmal Atrial Fibrillation, Lung Cancer stage IA 2016 s/p resection, CML diagnosed 2015 (on Dastinib 100mg  daily)and Cholangiocarcinoma, B12 defeciency, Alcoholic Hepatitis   Significant Hospital Events: Including procedures, antibiotic start and stop dates in addition to other pertinent events   5/15:   Interim History / Subjective:  N/a  Objective    Blood pressure (!) 80/59, pulse 81, temperature (!) 97.5 F (36.4 C), resp. rate 20, SpO2 98%.        Intake/Output Summary (Last 24 hours) at 02/05/2024 2219 Last data filed at 02/05/2024  2144 Gross per 24 hour  Intake 245.91 ml  Output --  Net 245.91 ml   There were no vitals filed for this visit.  Examination: General: alert  awake, NAD HENT: PERRLA no icterus EOMI, mildly dry mm Lungs: CTA no wheezes no rales/rhonchi Cardiovascular: irreg/irreg, s1s2 no gallops Abdomen: soft nt nd bs pos no guarding Extremities: 2+pitting edema, no cyanosis, or clubbing Neuro: AAO x 3 CN II to XII grossly intact   Resolved problem list   Assessment and Plan  Pneumonia Will start broad spectrum antibiotics This may explain his weakness as well, along with his other chronic issues  Hypotension He has chronically low BP and was to be on Midodrine , but was not taking it and actually taking BP meds. This may be form a combination of his HF-diastolic dysfunction and Alcoholic hepatitis Start Midodrine  Will give 500cc more IV Fluid bolus     Hypokalemia  Suspect diuretic use. Will recheck potassium level.     COPD with asthma (HCC) Bronchodilators as needed. Supplemental oxygen as needed.     Gout On allopurinol  300 mg p.o. daily.Aaron Aas     HLD (hyperlipidemia) Currently not on medical therapy. Follow-up with PCP as an outpatient.     BPH (benign prostatic hyperplasia) Hold tamsulosin  to avoid hypotension.     CAD (coronary artery disease) Continue daily aspirin . Follow-up with cardiology as an outpatient.     Chronic myelogenous leukemia (HCC) Follow-up with oncology as an outpatient.     HTN (hypertension) Hold antihypertensives.     Alcohol abuse Monitor for withdrawal symptoms. Begin CIWA protocol as needed.     Ascending aortic  aneurysm [facility]) Follow-up with vascular surgery as an outpatient.      Advance Care Planning:   Code Status: Full Code    Admit to ICU Best Practice (right click and "Reselect all SmartList Selections" daily)   Diet/type: Regular consistency (see orders) DVT prophylaxis systemic heparin  Pressure ulcer(s): N/A GI prophylaxis:  H2B Lines: N/A Foley:  N/A Code Status:  full code   Labs   CBC: Recent Labs  Lab 02/05/24 1524 02/05/24 1554  WBC 6.9  --   NEUTROABS 5.0  --   HGB 13.6 13.6  HCT 38.0* 40.0  MCV 98.4  --   PLT 286  --     Basic Metabolic Panel: Recent Labs  Lab 02/05/24 1524 02/05/24 1554 02/05/24 1911  NA 123* 122*  --   K 3.4* 3.3*  --   CL 87*  --   --   CO2 24  --   --   GLUCOSE 89  --   --   BUN 25*  --   --   CREATININE 1.50*  --   --   CALCIUM 7.7*  --   --   MG  --   --  1.9   GFR: CrCl cannot be calculated (Unknown ideal weight.). Recent Labs  Lab 02/05/24 1524 02/05/24 1947  WBC 6.9  --   LATICACIDVEN  --  1.9    Liver Function Tests: Recent Labs  Lab 02/05/24 1524  AST 61*  ALT 26  ALKPHOS 69  BILITOT 0.7  PROT 5.7*  ALBUMIN  2.4*   No results for input(s): "LIPASE", "AMYLASE" in the last 168 hours. No results for input(s): "AMMONIA" in the last 168 hours.  ABG    Component Value Date/Time   HCO3 24.1 02/05/2024 1554   TCO2 25 02/05/2024 1554   ACIDBASEDEF 1.0 02/05/2024 1554   O2SAT 54 02/05/2024 1554     Coagulation Profile: No results for input(s): "INR", "PROTIME" in the last 168 hours.  Cardiac Enzymes: No results for input(s): "CKTOTAL", "CKMB", "CKMBINDEX", "TROPONINI" in the last 168 hours.  HbA1C: Hemoglobin A1C  Date/Time Value Ref Range Status  12/13/2022 12:00 AM 4.5  Final   Hgb A1c MFr Bld  Date/Time Value Ref Range Status  09/25/2020 09:17 AM 4.7 (L) 4.8 - 5.6 % Final    Comment:    (NOTE) Pre diabetes:          5.7%-6.4%  Diabetes:              >6.4%  Glycemic control for   <7.0% adults with diabetes     CBG: No results for input(s): "GLUCAP" in the last 168 hours.  Review of Systems:   Abdominal discomfort, increase LE edema, no n/v/d, no brbpr, no chest pain  Past Medical History:  He,  has a past medical history of Abnormal colonoscopy (02/23/2021), Abnormal transaminases (02/27/2022), Acute diastolic  (congestive) heart failure (HCC) (10/08/2023), Adenomatous polyp of descending colon (02/23/2021), Adverse drug reaction, initial encounter (07/04/2016), Anemia in chronic illness (01/22/2013), Arthritis, degenerative (01/31/2015), Ascending aortic aneurysm (HCC) (06/27/2022), Benign fibroma of prostate (01/31/2015), BP (high blood pressure) (01/22/2013), BPH (benign prostatic hyperplasia), CAD (coronary artery disease), Cannot sleep (01/31/2015), Carcinoma of biliary tract (HCC) (02/15/2013), Cataract, Cervical osteoarthritis (10/18/2014), Cholangiocarcinoma of biliary tract (HCC), Chronic cough (01/31/2015), Chronic myelogenous leukemia (HCC), Chronic pain (01/31/2015), CML (chronic myelocytic leukemia) (HCC) (10/18/2014), Compulsive tobacco user syndrome (01/31/2015), Congestive heart failure (CHF) (HCC) (09/26/2023), COPD (chronic obstructive pulmonary disease) (HCC), COPD with asthma (HCC) (01/22/2013), Diuretic-induced  hypokalemia (05/23/2023), DJD (degenerative joint disease), Dyslipidemia, Family history of colonic polyps, Family history of prostate cancer, Full thickness rotator cuff tear (08/08/2020), Gout, High coronary artery calcium score (05/30/2023), History of colon polyps (02/23/2021), History of colonic polyps (10/01/2021), HLD (hyperlipidemia) (01/31/2015), HTN (hypertension), Hypercalcemia (08/10/2020), Hypercholesterolemia, Hypocalcemia (08/27/2021), Hyponatremia (09/19/2023), Impingement syndrome of both shoulders (03/31/2020), Lung cancer (HCC), Myelogenous leukemia (HCC), Non-small cell carcinoma of lung (HCC) (01/31/2015), OA (osteoarthritis), Orthostatic hypotension (10/10/2023), PAD (peripheral artery disease) (HCC), Personal history of gastric ulcers (02/23/2021), Renal insufficiency, Shoulder pain, left (03/29/2020), Shoulder pain, right (03/29/2020), Tendonitis, Thoracic aortic aneurysm (HCC) (06/27/2022), and Wheezing (01/31/2015).   Surgical History:   Past Surgical History:   Procedure Laterality Date   BIOPSY  04/30/2021   Procedure: BIOPSY;  Surgeon: Brice Campi Albino Alu., MD;  Location: [facility];  Service: Gastroenterology;;   Bowel duct surg  2014   Bowel cancer   CARDIAC CATHETERIZATION  2000   CHOLECYSTECTOMY     COLONOSCOPY  06/23/2015   mild sigmoid diverticulosis. small external and internal hemorrhoids. otherwise normal colonoscopy to terminal ileum   COLONOSCOPY WITH PROPOFOL  N/A 04/30/2021   Procedure: COLONOSCOPY WITH PROPOFOL ;  Surgeon: Mansouraty, Albino Alu., MD;  Location: [facility];  Service: Gastroenterology;  Laterality: N/A;   ENDOSCOPIC MUCOSAL RESECTION N/A 04/30/2021   Procedure: ENDOSCOPIC MUCOSAL RESECTION;  Surgeon: Brice Campi Albino Alu., MD;  Location: [facility];  Service: Gastroenterology;  Laterality: N/A;   ESOPHAGOGASTRODUODENOSCOPY (EGD) WITH PROPOFOL  N/A 04/30/2021   Procedure: ESOPHAGOGASTRODUODENOSCOPY (EGD) WITH PROPOFOL ;  Surgeon: Brice Campi Albino Alu., MD;  Location: [facility];  Service: Gastroenterology;  Laterality: N/A;   HEMOSTASIS CLIP PLACEMENT  04/30/2021   Procedure: HEMOSTASIS CLIP PLACEMENT;  Surgeon: Normie Becton., MD;  Location: [facility];  Service: Gastroenterology;;   LUNG CANCER SURGERY  09/2014   partial lobectomy   POLYPECTOMY  04/30/2021   Procedure: POLYPECTOMY;  Surgeon: Mansouraty, Albino Alu., MD;  Location: [facility];  Service: Gastroenterology;;   SHOULDER OPEN ROTATOR CUFF REPAIR Right 09/27/2020   Procedure: Right shoulder mini open rotator cuff repair, distal clavicle resection;  Surgeon: Orvan Blanch, MD;  Location: [facility];  Service: Orthopedics;  Laterality: Right;  90 mins  Choice with Block anesthesia   SUBMUCOSAL LIFTING INJECTION  04/30/2021   Procedure: SUBMUCOSAL LIFTING INJECTION;  Surgeon: Normie Becton., MD;  Location: [facility];  Service: Gastroenterology;;   UPPER GASTROINTESTINAL ENDOSCOPY     WHIPPLE PROCEDURE  02/25/2013    Procedure: WHIPPLE PROCEDURE; Surgeon: Kirk Peper, MD; Location: [facility] OR; Service: General; Laterality: N/A;     Social History:   reports that he has quit smoking. His smoking use included cigarettes. He has a 10 pack-year smoking history. He has never used smokeless tobacco. He reports current alcohol use of about 21.0 standard drinks of alcohol per week. He reports that he does not use drugs.   Family History:  His family history includes Asthma in his sister; Cancer in his half-sister and paternal grandfather; Hypertension in his brother, mother, and sister; Prostate cancer (age of onset: 69) in his father. There is no history of Esophageal cancer, Colon polyps, Colon cancer, Stomach cancer, Rectal cancer, Inflammatory bowel disease, Liver disease, or Pancreatic cancer.   Allergies Allergies  Allergen Reactions   Pneumococcal Vaccines Swelling     Home Medications  Prior to Admission medications   Medication Sig Start Date End Date Taking? Authorizing Provider  albuterol  (PROVENTIL  HFA;VENTOLIN  HFA) 108 (90 BASE) MCG/ACT inhaler Inhale 2 puffs into the lungs every 6 (  six) hours as needed for wheezing or shortness of breath.   Yes [provider]  allopurinol  (ZYLOPRIM ) 300 MG tablet Take 300 mg by mouth daily.   Yes [provider]  aspirin  EC 81 MG tablet Take 1 tablet (81 mg total) by mouth daily. Swallow whole. 05/30/23  Yes Krasowski, Robert J, MD  Calcium Carbonate (CALCIUM 500 PO) Take 1 tablet by mouth 3 (three) times daily. 02/27/22  Yes Mosher, Kelli A, PA-C  Cholecalciferol (VITAMIN D ) 50 MCG (2000 UT) tablet Take 2,000 Units by mouth daily.   Yes [provider]  dasatinib  (SPRYCEL ) 100 MG tablet Take 1 tablet (100 mg total) by mouth daily. 01/13/24  Yes Nolia Baumgartner, MD  diclofenac Sodium (VOLTAREN) 1 % GEL Apply 2 g topically at bedtime as needed (Pain). 07/10/20  Yes [provider]  fluticasone  (FLONASE ) 50 MCG/ACT nasal spray  Place 2 sprays into both nostrils daily.   Yes [provider]  folic acid  (FOLVITE ) 1 MG tablet Take 1 tablet (1 mg total) by mouth daily. 12/12/23  Yes Barbee Lew, MD  HYDROcodone -acetaminophen  (NORCO/VICODIN) 5-325 MG tablet Take 1 tablet by mouth 4 (four) times daily as needed for moderate pain (pain score 4-6). 09/27/23  Yes [provider]  methocarbamol  (ROBAXIN ) 750 MG tablet Take 750 mg by mouth every 6 (six) hours as needed for muscle spasms. 04/07/23  Yes [provider]  Multiple Vitamin (MULTIVITAMIN WITH MINERALS) TABS tablet Take 1 tablet by mouth daily. 12/12/23  Yes Barbee Lew, MD  omeprazole  (PRILOSEC) 40 MG capsule TAKE 1 CAPSULE (40 MG TOTAL) BY MOUTH 2 (TWO) TIMES DAILY BEFORE A MEAL. 11/21/23  Yes Lajuan Pila, MD  polyethylene glycol (MIRALAX  / GLYCOLAX ) 17 g packet Take 17 g by mouth daily as needed for moderate constipation. 12/11/23  Yes Barbee Lew, MD  tamsulosin  (FLOMAX ) 0.4 MG CAPS capsule Take 0.8 mg by mouth daily. 09/08/23  Yes [provider]  thiamine  (VITAMIN B-1) 100 MG tablet Take 1 tablet (100 mg total) by mouth daily. 12/12/23  Yes Barbee Lew, MD  TRELEGY ELLIPTA 100-62.5-25 MCG/INH AEPB Inhale 1 puff into the lungs daily at 6 (six) AM. 12/20/20  Yes [provider]  furosemide  (LASIX ) 80 MG tablet Take 80 mg by mouth 2 (two) times daily. Patient not taking: Reported on 02/05/2024 12/18/23   [provider]  irbesartan -hydrochlorothiazide  (AVALIDE) 150-12.5 MG tablet Take 1 tablet by mouth daily. Patient not taking: Reported on 02/05/2024 01/08/24   [provider]  midodrine  (PROAMATINE ) 5 MG tablet Take 3 tablets (15 mg total) by mouth 3 (three) times daily with meals. Patient not taking: Reported on 02/05/2024 12/11/23   Barbee Lew, MD  torsemide  (DEMADEX ) 20 MG tablet Take 20 mg by mouth daily. Patient not taking: Reported on 02/05/2024 02/05/24   [provider]     Critical care time: 67   The patient is critically ill with multiple organ system failure and requires high complexity decision making for assessment and support, frequent evaluation and titration of therapies, advanced monitoring, review of radiographic studies and interpretation of complex data.   Critical Care Time devoted to patient care services, exclusive of separately billable procedures, described in this note is 32 minutes.   Claven Cumming, MD [facility] See Amion for pager  If no response to pager , please call [phone] until 7pm After 7:00 pm call Elink  [phone] 02/05/2024, 10:19 PM

## 2024-02-05 NOTE — ED Provider Notes (Signed)
 [place] EMERGENCY DEPARTMENT AT [facility] Provider Note   CSN: [id number] Arrival date & time: 02/05/24  1502     History  No chief complaint on file.   Kyle Montoya is a 73 y.o. male.  Patient is a 73 year old male who presents to the emergency department the chief complaint of generalized weakness and increased lower extremity edema.  Patient has not been taking his milrinone.  Patient denies any associated abnormal headaches, pain to neck or back.  He has had no associated chest pain or shortness of breath.  He denies any abdominal pain, nausea, vomiting, diarrhea.  He does admit to poor p.o. intake over the past few days.  He notes that he has had increased weakness with attempted ambulation.  He denies any recent falls or injuries.        Home Medications Prior to Admission medications   Medication Sig Start Date End Date Taking? Authorizing Provider  albuterol  (PROVENTIL  HFA;VENTOLIN  HFA) 108 (90 BASE) MCG/ACT inhaler Inhale 2 puffs into the lungs every 6 (six) hours as needed for wheezing or shortness of breath.    [provider]  allopurinol  (ZYLOPRIM ) 300 MG tablet Take 300 mg by mouth daily.    [provider]  aspirin  EC 81 MG tablet Take 1 tablet (81 mg total) by mouth daily. Swallow whole. 05/30/23   Krasowski, Robert J, MD  Calcium Carbonate (CALCIUM 500 PO) Take 1 tablet by mouth 3 (three) times daily. 02/27/22   Mosher, Kelli A, PA-C  Cholecalciferol (VITAMIN D ) 50 MCG (2000 UT) tablet Take 2,000 Units by mouth daily.    [provider]  dasatinib  (SPRYCEL ) 100 MG tablet Take 1 tablet (100 mg total) by mouth daily. 01/13/24   Nolia Baumgartner, MD  diclofenac Sodium (VOLTAREN) 1 % GEL Apply 2 g topically at bedtime as needed (Pain). 07/10/20   [provider]  fluticasone  (FLONASE ) 50 MCG/ACT nasal spray Place 2 sprays into both nostrils daily.    [provider]  folic acid  (FOLVITE ) 1 MG tablet Take 1  tablet (1 mg total) by mouth daily. 12/12/23   Barbee Lew, MD  HYDROcodone -acetaminophen  (NORCO/VICODIN) 5-325 MG tablet Take 1 tablet by mouth 4 (four) times daily as needed for moderate pain (pain score 4-6). 09/27/23   [provider]  methocarbamol  (ROBAXIN ) 750 MG tablet Take 750 mg by mouth every 6 (six) hours as needed for muscle spasms. 04/07/23   [provider]  midodrine  (PROAMATINE ) 5 MG tablet Take 3 tablets (15 mg total) by mouth 3 (three) times daily with meals. 12/11/23   Barbee Lew, MD  Multiple Vitamin (MULTIVITAMIN WITH MINERALS) TABS tablet Take 1 tablet by mouth daily. 12/12/23   Barbee Lew, MD  omeprazole  (PRILOSEC) 40 MG capsule TAKE 1 CAPSULE (40 MG TOTAL) BY MOUTH 2 (TWO) TIMES DAILY BEFORE A MEAL. 11/21/23   Lajuan Pila, MD  polyethylene glycol (MIRALAX  / GLYCOLAX ) 17 g packet Take 17 g by mouth daily as needed for moderate constipation. 12/11/23   Barbee Lew, MD  tamsulosin  (FLOMAX ) 0.4 MG CAPS capsule Take 0.8 mg by mouth daily. 09/08/23   [provider]  thiamine  (VITAMIN B-1) 100 MG tablet Take 1 tablet (100 mg total) by mouth daily. 12/12/23   Barbee Lew, MD  TRELEGY ELLIPTA 100-62.5-25 MCG/INH AEPB Inhale 1 puff into the lungs daily at 6 (six) AM. 12/20/20   [provider]      Allergies  Pneumococcal vaccines    Review of Systems   Review of Systems  Constitutional:  Positive for fatigue.  Cardiovascular:  Positive for leg swelling.    Physical Exam Updated Vital Signs BP (!) 77/56   Pulse 83   Temp (!) 97.5 F (36.4 C)   Resp 16   SpO2 98%  Physical Exam Vitals and nursing note reviewed.  Constitutional:      Appearance: Normal appearance.  HENT:     Head: Normocephalic and atraumatic.     Nose: Nose normal.     Mouth/Throat:     Mouth: Mucous membranes are moist.  Eyes:     Extraocular Movements: Extraocular movements intact.     Conjunctiva/sclera: Conjunctivae  normal.     Pupils: Pupils are equal, round, and reactive to light.  Cardiovascular:     Rate and Rhythm: Normal rate and regular rhythm.     Pulses: Normal pulses.     Heart sounds: Normal heart sounds. No murmur heard.    No gallop.  Pulmonary:     Effort: Pulmonary effort is normal. No respiratory distress.     Breath sounds: Normal breath sounds. No stridor. No wheezing, rhonchi or rales.  Abdominal:     General: Abdomen is flat. Bowel sounds are normal. There is no distension.     Palpations: Abdomen is soft.     Tenderness: There is no abdominal tenderness. There is no guarding.  Musculoskeletal:        General: Normal range of motion.     Cervical back: Normal range of motion and neck supple. No rigidity or tenderness.     Right lower leg: Edema present.     Left lower leg: Edema present.  Skin:    General: Skin is warm and dry.     Findings: No bruising or rash.  Neurological:     General: No focal deficit present.     Mental Status: He is alert and oriented to person, place, and time. Mental status is at baseline.     Cranial Nerves: No cranial nerve deficit.     Sensory: No sensory deficit.     Motor: No weakness.     Coordination: Coordination normal.  Psychiatric:        Mood and Affect: Mood normal.        Behavior: Behavior normal.        Thought Content: Thought content normal.        Judgment: Judgment normal.     ED Results / Procedures / Treatments   Labs (all labs ordered are listed, but only abnormal results are displayed) Labs Reviewed  COMPREHENSIVE METABOLIC PANEL WITH GFR - Abnormal; Notable for the following components:      Result Value   Sodium 123 (*)    Potassium 3.4 (*)    Chloride 87 (*)    BUN 25 (*)    Creatinine, Ser 1.50 (*)    Calcium 7.7 (*)    Total Protein 5.7 (*)    Albumin  2.4 (*)    AST 61 (*)    GFR, Estimated 49 (*)    All other components within normal limits  BRAIN NATRIURETIC PEPTIDE - Abnormal; Notable for the  following components:   B Natriuretic Peptide 149.5 (*)    All other components within normal limits  CBC WITH DIFFERENTIAL/PLATELET - Abnormal; Notable for the following components:   RBC 3.86 (*)    HCT 38.0 (*)    MCH 35.2 (*)  All other components within normal limits  I-STAT VENOUS BLOOD GAS, ED - Abnormal; Notable for the following components:   pCO2, Ven 39.4 (*)    pO2, Ven 28 (*)    Sodium 122 (*)    Potassium 3.3 (*)    Calcium, Ion 0.98 (*)    All other components within normal limits  TROPONIN I (HIGH SENSITIVITY) - Abnormal; Notable for the following components:   Troponin I (High Sensitivity) 55 (*)    All other components within normal limits  CULTURE, BLOOD (ROUTINE X 2)  CULTURE, BLOOD (ROUTINE X 2)  URINALYSIS, ROUTINE W REFLEX MICROSCOPIC  MAGNESIUM   I-STAT CG4 LACTIC ACID, ED  TROPONIN I (HIGH SENSITIVITY)    EKG None  Radiology DG Chest Port 1 View Result Date: 02/05/2024 CLINICAL DATA:  Shortness of breath. EXAM: PORTABLE CHEST 1 VIEW COMPARISON:  Chest [place]  dated 12/25/2023. FINDINGS: Small right pleural effusion and right lung base atelectasis or infiltrate. The left lung is clear. No pneumothorax. Stable cardiac silhouette. No acute osseous pathology. IMPRESSION: Small right pleural effusion and right lung base atelectasis or infiltrate. Electronically Signed   By: Angus Bark M.D.   On: 02/05/2024 17:16    Procedures .Critical Care  Performed by: Roselynn Connors, PA-C Authorized by: Roselynn Connors, PA-C   Critical care provider statement:    Critical care time (minutes):  45   Critical care was necessary to treat or prevent imminent or life-threatening deterioration of the following conditions:  Circulatory failure   Critical care was time spent personally by me on the following activities:  Development of treatment plan with patient or surrogate, discussions with consultants, evaluation of patient's response to treatment,  examination of patient, ordering and review of laboratory studies, ordering and review of radiographic studies, ordering and performing treatments and interventions, pulse oximetry, re-evaluation of patient's condition and review of old charts   I assumed direction of critical care for this patient from another provider in my specialty: no     Care discussed with: admitting provider       Medications Ordered in ED Medications  cefTRIAXone (ROCEPHIN) 1 g in sodium chloride  0.9 % 100 mL IVPB (has no administration in time range)  azithromycin (ZITHROMAX) 500 mg in sodium chloride  0.9 % 250 mL IVPB (has no administration in time range)  midodrine  (PROAMATINE ) tablet 10 mg (10 mg Oral Given 02/05/24 1622)  lactated ringers  bolus 500 mL (500 mLs Intravenous New Bag/Given 02/05/24 1625)    ED Course/ Medical Decision Making/ A&P                                 Medical Decision Making Amount and/or Complexity of Data Reviewed Labs: ordered. Radiology: ordered.  Risk Prescription drug management. Decision regarding hospitalization.   This patient presents to the ED for concern of weakness, increased edema differential diagnosis includes acute CHF, pneumonia, ACS, acute renal failure, electrolyte derangement, anemia, GI bleed   Co morbidities that complicate the patient evaluation  CHF, alcoholism   Additional history obtained:  Additional history obtained from family External records from outside source obtained and reviewed including medical records   Lab Tests:  I Ordered, and personally interpreted labs.  The pertinent results include: No leukocytosis, no anemia, hyponatremia, mild hypokalemia, elevated creatinine, elevated troponin but flat, unremarkable urinalysis, elevated BNP, normal magnesium , normal lactic acid   Imaging Studies ordered:  I ordered imaging studies including chest  x-ray I independently visualized and interpreted imaging which showed right pleural  effusion, atelectasis versus infiltrate I agree with the radiologist interpretation   Cardiac Monitoring: / EKG:  The patient was maintained on a cardiac monitor.  I personally viewed and interpreted the cardiac monitored which showed an underlying rhythm of: Atrial fibrillation, no ischemic changes, no STEMI   Consultations Obtained:  I requested consultation with the intensivist, hospitalist,  and discussed lab and imaging findings as well as pertinent plan - they recommend: Admission   Problem List / ED Course / Critical interventions / Medication management  Patient does remain stable at this time.  On reevaluation once patient's family arrived they noted that they may have mixed up his blood pressure medications and may have been giving him blood pressure medicines instead of midodrine .  Did initially start giving the patient IV fluids as he did appear to be dehydrated and has poor p.o. intake.  Patient has worsening hyponatremia as well as associated acute kidney injury.  EKG demonstrated no indication for STEMI and serial troponins have been negative.  Do not suspect ACS at this time.  Chest x-ray does demonstrate pleural effusion and possible infiltrate and he has been started on IV antibiotics.  Suspect that his hypotension is secondary to medication noncompliance associated with secondary CHF.  Did initially discuss patient case with Dr. Zaida Hertz with the intensivist service who did evaluate the patient and deemed that he was stable for the floor.  I then discussed the patient case with Dr. Chiquita Councilman with the hospitalist service who did evaluate the patient and felt as though the patient did not need to be in the ICU.  I then again discussed the patient case with Dr. Mason Sole with the intensivist service who has evaluated the patient and accepted him for admission.  Patient has continued to mentate at his baseline and is has no signs of acute respiratory distress.  He was given a dose of midodrine   in the emergency department.  Patient has voiced understanding to the plan for admission at this time and has had no additional questions.  Patient was fully evaluated by attending physician who is in agreement to plan at this point. I ordered medication including IV fluids, Rocephin, azithromycin, midodrine  for hypotension, possible pneumonia Reevaluation of the patient after these medicines showed that the patient improved I have reviewed the patients home medicines and have made adjustments as needed   Social Determinants of Health:  None   Test / Admission - Considered:  Admission             Final Clinical Impression(s) / ED Diagnoses Final diagnoses:  None    Rx / DC Orders ED Discharge Orders     None         Roselynn Connors, PA-C 02/05/24 2256    Rolinda Climes, DO 02/05/24 2259

## 2024-02-05 NOTE — Telephone Encounter (Signed)
 Patient has been scheduled for follow-up visit per 02/05/24 LOS.  Pt aware of scheduled appt details.

## 2024-02-05 NOTE — Progress Notes (Signed)
 PHARMACY - ANTICOAGULATION CONSULT NOTE  Pharmacy Consult for Heparin  Indication: atrial fibrillation  Allergies  Allergen Reactions   Pneumococcal Vaccines Swelling    Patient Measurements:    Vital Signs: Temp: 97.5 F (36.4 C) (05/15 1508) BP: 85/50 (05/15 2230) Pulse Rate: 81 (05/15 2130)  Labs: Recent Labs    02/05/24 1524 02/05/24 1554 02/05/24 1911  HGB 13.6 13.6  --   HCT 38.0* 40.0  --   PLT 286  --   --   CREATININE 1.50*  --   --   TROPONINIHS 55*  --  55*    CrCl cannot be calculated (Unknown ideal weight.).   Medical History: Past Medical History:  Diagnosis Date   Abnormal colonoscopy 02/23/2021   Abnormal transaminases 02/27/2022   Acute diastolic (congestive) heart failure (HCC) 10/08/2023   Adenomatous polyp of descending colon 02/23/2021   Adverse drug reaction, initial encounter 07/04/2016   07/02/2016: Prevnar 23 vaccine given in right arm  07/02/2016: localized swelling  10/11/20167: Increase in swelling from shoulder to inner aspect of elbow.Red, warm and firm to touch.  No lip or airway swelling or stridor.  2+ brachial and radial pulses right arm.  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 Kirk Peper Oncologist, has f/u every 6-12 months.   Cataract    bilateral sx   Cervical osteoarthritis 10/18/2014   Cholangiocarcinoma of biliary tract (HCC)    Chronic cough 01/31/2015   Chronic myelogenous leukemia (HCC)    Chronic pain 01/31/2015   CML (chronic myelocytic leukemia) (HCC) 10/18/2014   Overview:   Follows with [facility], Dr. Almer Jacobson. On dasatinib , last WBC 9.6.     Compulsive tobacco user syndrome 01/31/2015   Congestive heart failure (CHF) (HCC) 09/26/2023   COPD (chronic obstructive pulmonary disease) (HCC)    uses inhaler   COPD with asthma (HCC) 01/22/2013   Severe obstruction on spirometry FeV1 54% FeV1/FVC 51% 01/31/2015  CXR NAD 01/25/15     Diuretic-induced hypokalemia 05/23/2023   DJD (degenerative joint disease)    Dyslipidemia    Family history of colonic polyps    Family history of prostate cancer    Full thickness rotator cuff tear 08/08/2020   Gout    High coronary artery calcium score 05/30/2023   History of colon polyps 02/23/2021   History of colonic polyps 10/01/2021   IMO SNOMED Dx Update Oct 2024     HLD (hyperlipidemia) 01/31/2015   HTN (hypertension)    on meds   Hypercalcemia 08/10/2020   Hypercholesterolemia    Hypocalcemia 08/27/2021   Hyponatremia 09/19/2023   Impingement syndrome of both shoulders 03/31/2020   Lung cancer (HCC)    Myelogenous leukemia (HCC)    Non-small cell carcinoma of lung (HCC) 01/31/2015   02/13/2015 CT Chest no evidence of recurrence in lung cancer  Stage I adenocarcinoma diagnosed with left upper lobectomy in February 2016     OA (osteoarthritis)    Orthostatic hypotension 10/10/2023   PAD (peripheral artery disease) (HCC)    Personal history of gastric ulcers 02/23/2021  Renal insufficiency    Shoulder pain, left 03/29/2020   Shoulder pain, right 03/29/2020   Tendonitis    patellar   Thoracic aortic aneurysm (HCC) 06/27/2022   Wheezing 01/31/2015    Medications:  No current facility-administered medications on file prior to encounter.   Current Outpatient Medications on File Prior to Encounter  Medication Sig Dispense Refill   albuterol  (PROVENTIL  HFA;VENTOLIN  HFA) 108 (90 BASE) MCG/ACT inhaler Inhale 2 puffs into the lungs every 6 (six) hours as needed for wheezing or shortness of breath.     allopurinol  (ZYLOPRIM ) 300 MG  tablet Take 300 mg by mouth daily.     aspirin  EC 81 MG tablet Take 1 tablet (81 mg total) by mouth daily. Swallow whole.     Calcium Carbonate (CALCIUM 500 PO) Take 1 tablet by mouth 3 (three) times daily.     Cholecalciferol (VITAMIN D ) 50 MCG (2000 UT) tablet Take 2,000 Units by mouth daily.     dasatinib  (SPRYCEL ) 100 MG tablet Take 1 tablet (100 mg total) by mouth daily. 30 tablet 5   diclofenac Sodium (VOLTAREN) 1 % GEL Apply 2 g topically at bedtime as needed (Pain).     fluticasone  (FLONASE ) 50 MCG/ACT nasal spray Place 2 sprays into both nostrils daily.     folic acid  (FOLVITE ) 1 MG tablet Take 1 tablet (1 mg total) by mouth daily.     HYDROcodone -acetaminophen  (NORCO/VICODIN) 5-325 MG tablet Take 1 tablet by mouth 4 (four) times daily as needed for moderate pain (pain score 4-6).     methocarbamol  (ROBAXIN ) 750 MG tablet Take 750 mg by mouth every 6 (six) hours as needed for muscle spasms.     Multiple Vitamin (MULTIVITAMIN WITH MINERALS) TABS tablet Take 1 tablet by mouth daily.     omeprazole  (PRILOSEC) 40 MG capsule TAKE 1 CAPSULE (40 MG TOTAL) BY MOUTH 2 (TWO) TIMES DAILY BEFORE A MEAL. 180 capsule 0   polyethylene glycol (MIRALAX  / GLYCOLAX ) 17 g packet Take 17 g by mouth daily as needed for moderate constipation. 14 each 0   tamsulosin  (FLOMAX ) 0.4 MG CAPS capsule Take 0.8 mg by mouth daily.     thiamine  (VITAMIN B-1) 100 MG tablet Take 1 tablet (100 mg total) by mouth daily.     TRELEGY ELLIPTA 100-62.5-25 MCG/INH AEPB Inhale 1 puff into the lungs daily at 6 (six) AM.     furosemide  (LASIX ) 80 MG tablet Take 80 mg by mouth 2 (two) times daily. (Patient not taking: Reported on 02/05/2024)     irbesartan -hydrochlorothiazide  (AVALIDE) 150-12.5 MG tablet Take 1 tablet by mouth daily. (Patient not taking: Reported on 02/05/2024)     midodrine  (PROAMATINE ) 5 MG tablet Take 3 tablets (15 mg total) by mouth 3 (three) times daily with meals. (Patient not taking: Reported on 02/05/2024) 90  tablet 1   torsemide  (DEMADEX ) 20 MG tablet Take 20 mg by mouth daily. (Patient not taking: Reported on 02/05/2024)       Assessment: 73 y.o. male with Afib for heparin   Goal of Therapy:  Heparin  level 0.3-0.7 units/ml Monitor platelets by anticoagulation protocol: Yes   Plan:  Heparin  4000 units IV bolus, then start heparin  1200 units/hr Check heparin  level in 8 hours.   Kyle Montoya 02/05/2024,11:02 PM

## 2024-02-05 NOTE — ED Triage Notes (Signed)
 BIB [employer]. HX of CHF. Bilateral ankle swelling. EKG notes Afib/Type 2 [place]  BP 70/40s 50 Map CBG 106 99 RA  22 ET

## 2024-02-06 DIAGNOSIS — I480 Paroxysmal atrial fibrillation: Secondary | ICD-10-CM

## 2024-02-06 DIAGNOSIS — J189 Pneumonia, unspecified organism: Secondary | ICD-10-CM | POA: Diagnosis not present

## 2024-02-06 DIAGNOSIS — I5033 Acute on chronic diastolic (congestive) heart failure: Secondary | ICD-10-CM | POA: Diagnosis not present

## 2024-02-06 DIAGNOSIS — A419 Sepsis, unspecified organism: Principal | ICD-10-CM

## 2024-02-06 DIAGNOSIS — R6521 Severe sepsis with septic shock: Secondary | ICD-10-CM

## 2024-02-06 DIAGNOSIS — E871 Hypo-osmolality and hyponatremia: Secondary | ICD-10-CM | POA: Diagnosis not present

## 2024-02-06 DIAGNOSIS — N179 Acute kidney failure, unspecified: Secondary | ICD-10-CM | POA: Diagnosis not present

## 2024-02-06 LAB — GLUCOSE, CAPILLARY
Glucose-Capillary: 112 mg/dL — ABNORMAL HIGH (ref 70–99)
Glucose-Capillary: 132 mg/dL — ABNORMAL HIGH (ref 70–99)
Glucose-Capillary: 157 mg/dL — ABNORMAL HIGH (ref 70–99)
Glucose-Capillary: 68 mg/dL — ABNORMAL LOW (ref 70–99)
Glucose-Capillary: 72 mg/dL (ref 70–99)
Glucose-Capillary: 92 mg/dL (ref 70–99)
Glucose-Capillary: 95 mg/dL (ref 70–99)

## 2024-02-06 LAB — CBC
HCT: 35.2 % — ABNORMAL LOW (ref 39.0–52.0)
Hemoglobin: 12.7 g/dL — ABNORMAL LOW (ref 13.0–17.0)
MCH: 35.3 pg — ABNORMAL HIGH (ref 26.0–34.0)
MCHC: 36.1 g/dL — ABNORMAL HIGH (ref 30.0–36.0)
MCV: 97.8 fL (ref 80.0–100.0)
Platelets: 312 10*3/uL (ref 150–400)
RBC: 3.6 MIL/uL — ABNORMAL LOW (ref 4.22–5.81)
RDW: 14.5 % (ref 11.5–15.5)
WBC: 7.7 10*3/uL (ref 4.0–10.5)
nRBC: 0 % (ref 0.0–0.2)

## 2024-02-06 LAB — BASIC METABOLIC PANEL WITH GFR
Anion gap: 10 (ref 5–15)
BUN: 16 mg/dL (ref 8–23)
CO2: 26 mmol/L (ref 22–32)
Calcium: 7.9 mg/dL — ABNORMAL LOW (ref 8.9–10.3)
Chloride: 92 mmol/L — ABNORMAL LOW (ref 98–111)
Creatinine, Ser: 0.83 mg/dL (ref 0.61–1.24)
GFR, Estimated: >60 mL/min (ref >60)
Glucose, Bld: 90 mg/dL (ref 70–99)
Potassium: 3.5 mmol/L (ref 3.5–5.1)
Sodium: 128 mmol/L — ABNORMAL LOW (ref 135–145)

## 2024-02-06 LAB — HEMOGLOBIN A1C
Hgb A1c MFr Bld: 4.6 % — ABNORMAL LOW (ref 4.8–5.6)
Mean Plasma Glucose: 85.32 mg/dL

## 2024-02-06 LAB — COMPREHENSIVE METABOLIC PANEL WITH GFR
ALT: 23 U/L (ref 0–44)
AST: 54 U/L — ABNORMAL HIGH (ref 15–41)
Albumin: 2.4 g/dL — ABNORMAL LOW (ref 3.5–5.0)
Alkaline Phosphatase: 61 U/L (ref 38–126)
Anion gap: 12 (ref 5–15)
BUN: 24 mg/dL — ABNORMAL HIGH (ref 8–23)
CO2: 23 mmol/L (ref 22–32)
Calcium: 7.3 mg/dL — ABNORMAL LOW (ref 8.9–10.3)
Chloride: 90 mmol/L — ABNORMAL LOW (ref 98–111)
Creatinine, Ser: 1.2 mg/dL (ref 0.61–1.24)
GFR, Estimated: >60 mL/min (ref >60)
Glucose, Bld: 105 mg/dL — ABNORMAL HIGH (ref 70–99)
Potassium: 2.9 mmol/L — ABNORMAL LOW (ref 3.5–5.1)
Sodium: 125 mmol/L — ABNORMAL LOW (ref 135–145)
Total Bilirubin: 0.7 mg/dL (ref 0.0–1.2)
Total Protein: 5.2 g/dL — ABNORMAL LOW (ref 6.5–8.1)

## 2024-02-06 LAB — MAGNESIUM: Magnesium: 1.8 mg/dL (ref 1.7–2.4)

## 2024-02-06 LAB — PROTIME-INR
INR: 1.1 (ref 0.8–1.2)
Prothrombin Time: 14.6 s (ref 11.4–15.2)

## 2024-02-06 LAB — PHOSPHORUS: Phosphorus: 4.3 mg/dL (ref 2.5–4.6)

## 2024-02-06 LAB — HEPARIN LEVEL (UNFRACTIONATED): Heparin Unfractionated: 0.61 [IU]/mL (ref 0.30–0.70)

## 2024-02-06 LAB — CBG MONITORING, ED: Glucose-Capillary: 92 mg/dL (ref 70–99)

## 2024-02-06 LAB — MRSA NEXT GEN BY PCR, NASAL: MRSA by PCR Next Gen: NOT DETECTED

## 2024-02-06 LAB — APTT: aPTT: 125 s — ABNORMAL HIGH (ref 24–36)

## 2024-02-06 MED ORDER — ORAL CARE MOUTH RINSE: 15.0000 mL | OROMUCOSAL | Status: DC | PRN

## 2024-02-06 MED ORDER — LORAZEPAM 2 MG/ML IJ SOLN
1.0000 mg | INTRAMUSCULAR | Status: DC | PRN
  Administered 2024-02-06 (×2): 1 mg via INTRAVENOUS
  Filled 2024-02-06 (×2): qty 1

## 2024-02-06 MED ORDER — PHENOBARBITAL SODIUM 130 MG/ML IJ SOLN
65.0000 mg | Freq: Three times a day (TID) | INTRAMUSCULAR | Status: DC
  Administered 2024-02-06 – 2024-02-07 (×3): 65 mg via INTRAVENOUS
  Filled 2024-02-06 (×4): qty 1

## 2024-02-06 MED ORDER — LORAZEPAM 2 MG/ML IJ SOLN
0.5000 mg | INTRAMUSCULAR | Status: DC | PRN
  Administered 2024-02-06 – 2024-02-07 (×3): 1 mg via INTRAVENOUS
  Filled 2024-02-06 (×3): qty 1

## 2024-02-06 MED ORDER — MAGNESIUM SULFATE 2 GM/50ML IV SOLN
2.0000 g | Freq: Once | INTRAVENOUS | Status: AC
  Administered 2024-02-06: 2 g via INTRAVENOUS
  Filled 2024-02-06: qty 50

## 2024-02-06 MED ORDER — POTASSIUM CHLORIDE 10 MEQ/100ML IV SOLN
10.0000 meq | INTRAVENOUS | Status: AC
  Administered 2024-02-06 – 2024-02-07 (×4): 10 meq via INTRAVENOUS
  Filled 2024-02-06: qty 100

## 2024-02-06 MED ORDER — FOLIC ACID 1 MG PO TABS: 1.0000 mg | ORAL_TABLET | Freq: Every day | ORAL | Status: DC

## 2024-02-06 MED ORDER — ADULT MULTIVITAMIN W/MINERALS CH
1.0000 | ORAL_TABLET | Freq: Every day | ORAL | Status: DC
Start: 2024-02-06 — End: 2024-02-11
  Administered 2024-02-06 – 2024-02-11 (×3): 1 via ORAL
  Filled 2024-02-06 (×4): qty 1

## 2024-02-06 MED ORDER — THIAMINE HCL 100 MG/ML IJ SOLN
200.0000 mg | INTRAVENOUS | Status: DC
  Administered 2024-02-06 – 2024-02-11 (×3): 200 mg via INTRAVENOUS
  Filled 2024-02-06 (×7): qty 2

## 2024-02-06 MED ORDER — PHENOBARBITAL SODIUM 65 MG/ML IJ SOLN: 32.5000 mg | Freq: Three times a day (TID) | INTRAMUSCULAR | Status: DC

## 2024-02-06 MED ORDER — POTASSIUM CHLORIDE CRYS ER 20 MEQ PO TBCR
40.0000 meq | EXTENDED_RELEASE_TABLET | Freq: Once | ORAL | Status: AC
  Administered 2024-02-06: 40 meq via ORAL
  Filled 2024-02-06: qty 2

## 2024-02-06 MED ORDER — POTASSIUM CHLORIDE CRYS ER 20 MEQ PO TBCR
40.0000 meq | EXTENDED_RELEASE_TABLET | ORAL | Status: DC
  Administered 2024-02-06 (×2): 40 meq via ORAL
  Filled 2024-02-06 (×2): qty 2

## 2024-02-06 MED ORDER — CHLORHEXIDINE GLUCONATE CLOTH 2 % EX PADS
6.0000 | MEDICATED_PAD | Freq: Every day | CUTANEOUS | Status: DC
  Administered 2024-02-06 – 2024-02-19 (×12): 6 via TOPICAL

## 2024-02-06 MED ORDER — POTASSIUM CHLORIDE 10 MEQ/100ML IV SOLN
10.0000 meq | INTRAVENOUS | Status: AC
  Administered 2024-02-06 (×4): 10 meq via INTRAVENOUS
  Filled 2024-02-06 (×4): qty 100

## 2024-02-06 MED ORDER — ALBUMIN HUMAN 25 % IV SOLN
12.5000 g | Freq: Once | INTRAVENOUS | Status: AC
  Administered 2024-02-06: 12.5 g via INTRAVENOUS
  Filled 2024-02-06: qty 50

## 2024-02-06 MED ORDER — LORAZEPAM 2 MG/ML IJ SOLN
1.0000 mg | INTRAMUSCULAR | Status: DC | PRN
  Administered 2024-02-06: 2 mg via INTRAVENOUS
  Filled 2024-02-06: qty 1

## 2024-02-06 MED ORDER — FUROSEMIDE 10 MG/ML IJ SOLN
60.0000 mg | Freq: Once | INTRAMUSCULAR | Status: AC
  Administered 2024-02-06: 60 mg via INTRAVENOUS
  Filled 2024-02-06: qty 6

## 2024-02-06 MED ORDER — THIAMINE MONONITRATE 100 MG PO TABS
100.0000 mg | ORAL_TABLET | ORAL | Status: DC
  Administered 2024-02-10: 100 mg via ORAL
  Filled 2024-02-06 (×3): qty 1

## 2024-02-06 MED ORDER — CEFTRIAXONE SODIUM 2 G IJ SOLR
2.0000 g | INTRAMUSCULAR | Status: AC
  Administered 2024-02-06 – 2024-02-10 (×5): 2 g via INTRAVENOUS
  Filled 2024-02-06 (×6): qty 20

## 2024-02-06 NOTE — Progress Notes (Signed)
 NAME:  Kyle Montoya, MRN:  [id number], DOB:  September 03, 1951, LOS: 1 ADMISSION DATE:  02/05/2024, CONSULTATION DATE:  02/05/2024 REFERRING MD:  Elise Guile, DO, CHIEF COMPLAINT:  weakness and LE edema   History of Present Illness:  73 y/o who has a PMH for HFpEF, Alcohol abuse, BPH, Cervical DDD, Gout, Asthma, COPD, chronic hyponatremia, HTN, Anemia, Ascending Aortic Aneurysm, Gastric Ulcer disease with H. Pylori, paroxysmal Atrial Fibrillation, Lung Cancer stage IA 2016 s/p resection, CML diagnosed 2015 (on Dastinib 100mg  daily)and Cholangiocarcinoma, B12 defeciency, Alcoholic Hepatitis who presented to ED with generalized weakness and increased LE edema.  He drinks 1 pint of Vodka daily and about 3 beers daily. In January he was admitted with acute heart failure and his Lasix  was stopped and he was placed on Torsemide  20mg  daily. In March he was admitted for Hyponatremia and generalized weakness His cxr shows b/l small pleural and possible RLL infiltrate. Pertinent  Medical History  HFpEF (normal echo EF in December 2024), Alcohol abuse, BPH, Cervical DDD, Gout, Asthma, COPD, Hyponatremia, Pneumonia, HTN, Anemia, Ascending Aortic Aneurysm, Gastric Ulcer disease with H. Pylori, paroxysmal Atrial Fibrillation, Lung Cancer stage IA 2016 s/p resection, CML diagnosed 2015 (on Dastinib 100mg  daily)and Cholangiocarcinoma, B12 defeciency, Alcoholic Hepatitis   Significant Hospital Events: Including procedures, antibiotic start and stop dates in addition to other pertinent events   5/15: Admitted to ICU with right lower lobe pneumonia and hypotension  Interim History / Subjective:  Patient is still requiring vasopressor support with Levophed  currently on 8 mics He received Ativan  overnight for alcohol withdrawal, currently lethargic and confused Afebrile, white count trended down  Objective    Blood pressure 95/66, pulse 65, temperature 97.8 F (36.6 C), temperature source Oral, resp. rate 17,  height 6' (1.829 m), weight 91.8 kg, SpO2 97%.        Intake/Output Summary (Last 24 hours) at 02/06/2024 0829 Last data filed at 02/06/2024 0700 Gross per 24 hour  Intake 1289.04 ml  Output 925 ml  Net 364.04 ml   Filed Weights   02/06/24 0230  Weight: 91.8 kg    Examination: General: Acute on chronically ill-appearing elderly male, lying on the bed HEENT: [place]/AT, eyes anicteric.  dry mucus membranes Neuro: Sleepy, opens eyes with vocal stimuli, confused and lethargic Chest: Reduced air entry at the bases bilaterally, no wheezes or rhonchi Heart: Irregularly irregular no murmurs or gallops.  2+ pitting edema noted in bilateral lower extremities Abdomen: Soft, nontender, nondistended, bowel sounds present  Labs and images reviewed  Patient Lines/Drains/Airways Status     Active Line/Drains/Airways     Name Placement date Placement time Site Days   Peripheral IV 02/05/24 22 G Distal;Posterior;Right Forearm 02/05/24  1625  Forearm  1   Peripheral IV 02/05/24 22 G 1.75" Anterior;Right Forearm 02/05/24  1914  Forearm  1   Peripheral IV 02/06/24 20 G 1.75" Left;Anterior Forearm 02/06/24  0202  Forearm  less than 1         Resolved problem list   Assessment and Plan  Bilateral lower lobe pneumonia, ?  Aspiration Continue ceftriaxone azithromycin to complete 5 days therapy Try to get sputum culture  Acute on chronic HFpEF Echocardiogram in March 2025 showing grade 1 diastolic dysfunction with normal EF Patient is noncompliant with his diuretics Will give him 60 mg of IV Lasix  with albumin  GDMT as tolerated  Paroxysmal A-fib Patient remained in A-fib He is not on anticoagulation at home Started on IV heparin  infusion for stroke prophylaxis  Shock, combined septic and cardiogenic Continue vasopressor support with MAP goal 65 On antibiotic and diuretics  Acute kidney injury due to cardiorenal syndrome Serum creatinine improved from 1.5 down to 1.2 Monitor intake and  output Avoid nephrotoxic agent   Hypokalemia  Hypervolemic hyponatremia Continue aggressive electrolyte replacement Serum sodium improved from 123-125   COPD, not in exacerbation Bronchodilators as needed.   CAD (coronary artery disease) Continue daily aspirin . Follow-up with cardiology as an outpatient.   Chronic myelogenous leukemia (HCC) Follow-up with oncology as an outpatient.   Alcohol abuse, now going into alcohol withdrawal Received Ativan  overnight, became lethargic Start low-dose phenobarbital withdrawal protocol Continue thiamine    Ascending aortic aneurysm [facility]) Follow-up with vascular surgery as an outpatient.   Best Practice (right click and "Reselect all SmartList Selections" daily)   Diet/type: Regular consistency (see orders) DVT prophylaxis systemic heparin  Pressure ulcer(s): N/A GI prophylaxis: N/A Lines: N/A Foley:  N/A Code Status:  full code   Labs   CBC: Recent Labs  Lab 02/05/24 1524 02/05/24 1554 02/06/24 0344  WBC 6.9  --  7.7  NEUTROABS 5.0  --   --   HGB 13.6 13.6 12.7*  HCT 38.0* 40.0 35.2*  MCV 98.4  --  97.8  PLT 286  --  312    Basic Metabolic Panel: Recent Labs  Lab 02/05/24 1524 02/05/24 1554 02/05/24 1911 02/06/24 0344  NA 123* 122*  --  125*  K 3.4* 3.3*  --  2.9*  CL 87*  --   --  90*  CO2 24  --   --  23  GLUCOSE 89  --   --  105*  BUN 25*  --   --  24*  CREATININE 1.50*  --   --  1.20  CALCIUM 7.7*  --   --  7.3*  MG  --   --  1.9 1.8  PHOS  --   --   --  4.3   GFR: Estimated Creatinine Clearance: 59.3 mL/min (by C-G formula based on SCr of 1.2 mg/dL). Recent Labs  Lab 02/05/24 1524 02/05/24 1947 02/06/24 0344  WBC 6.9  --  7.7  LATICACIDVEN  --  1.9  --     Liver Function Tests: Recent Labs  Lab 02/05/24 1524 02/06/24 0344  AST 61* 54*  ALT 26 23  ALKPHOS 69 61  BILITOT 0.7 0.7  PROT 5.7* 5.2*  ALBUMIN  2.4* 2.4*   No results for input(s): "LIPASE", "AMYLASE" in the last 168 hours. No  results for input(s): "AMMONIA" in the last 168 hours.  ABG    Component Value Date/Time   HCO3 24.1 02/05/2024 1554   TCO2 25 02/05/2024 1554   ACIDBASEDEF 1.0 02/05/2024 1554   O2SAT 54 02/05/2024 1554     Coagulation Profile: Recent Labs  Lab 02/06/24 0344  INR 1.1    Cardiac Enzymes: No results for input(s): "CKTOTAL", "CKMB", "CKMBINDEX", "TROPONINI" in the last 168 hours.  HbA1C: Hemoglobin A1C  Date/Time Value Ref Range Status  12/13/2022 12:00 AM 4.5  Final   Hgb A1c MFr Bld  Date/Time Value Ref Range Status  02/06/2024 03:44 AM 4.6 (L) 4.8 - 5.6 % Final    Comment:    (NOTE) Pre diabetes:          5.7%-6.4%  Diabetes:              >6.4%  Glycemic control for   <7.0% adults with diabetes   09/25/2020 09:17 AM 4.7 (L) 4.8 -  5.6 % Final    Comment:    (NOTE) Pre diabetes:          5.7%-6.4%  Diabetes:              >6.4%  Glycemic control for   <7.0% adults with diabetes     CBG: Recent Labs  Lab 02/06/24 0007 02/06/24 0450 02/06/24 0826  GLUCAP 92 132* 112*    The patient is critically ill due to combined septic/cardiogenic shock.  Critical care was necessary to treat or prevent imminent or life-threatening deterioration.  Critical care was time spent personally by me on the following activities: development of treatment plan with patient and/or surrogate as well as nursing, discussions with consultants, evaluation of patient's response to treatment, examination of patient, obtaining history from patient or surrogate, ordering and performing treatments and interventions, ordering and review of laboratory studies, ordering and review of radiographic studies, pulse oximetry, re-evaluation of patient's condition and participation in multidisciplinary rounds.   During this encounter critical care time was devoted to patient care services described in this note for 35 minutes.     Trevor Fudge, MD [facility] See Amion for  pager If no response to pager, please call [phone] until 7pm After 7pm, Please call E-link [phone]

## 2024-02-06 NOTE — Progress Notes (Signed)
 [facility] ADULT ICU REPLACEMENT PROTOCOL   The patient does apply for the [facility] Adult ICU Electrolyte Replacment Protocol based on the criteria listed below:   1.Exclusion criteria: TCTS, ECMO, Dialysis, and Myasthenia Gravis patients 2. Is GFR >/= 30 ml/min? Yes.    Patient's GFR today is >60 3. Is SCr </= 2? Yes.   Patient's SCr is 1.2 mg/dL 4. Did SCr increase >/= 0.5 in 24 hours? No. 5.Pt's weight >40kg  Yes.   6. Abnormal electrolyte(s):   K 2.9, Mg 1.8  7. Electrolytes replaced per protocol 8.  Call MD STAT for K+ </= 2.5, Phos </= 1, or Mag </= 1 Physician:  Derrek Flicker R Jadon Ressler 02/06/2024 5:48 AM

## 2024-02-06 NOTE — Progress Notes (Signed)
 PHARMACY - ANTICOAGULATION CONSULT NOTE  Pharmacy Consult for Heparin  Indication: atrial fibrillation  Allergies  Allergen Reactions   Pneumococcal Vaccines Swelling    Patient Measurements: Height: 6' (182.9 cm) Weight: 91.8 kg (202 lb 6.1 oz) IBW/kg (Calculated) : 77.6  Vital Signs: Temp: 97.8 F (36.6 C) (05/16 1130) Temp Source: Axillary (05/16 1130) BP: 105/73 (05/16 1130) Pulse Rate: 83 (05/16 1145)  Labs: Recent Labs    02/05/24 1524 02/05/24 1554 02/05/24 1911 02/06/24 0344 02/06/24 1013  HGB 13.6 13.6  --  12.7*  --   HCT 38.0* 40.0  --  35.2*  --   PLT 286  --   --  312  --   APTT  --   --   --  125*  --   LABPROT  --   --   --  14.6  --   INR  --   --   --  1.1  --   HEPARINUNFRC  --   --   --   --  0.61  CREATININE 1.50*  --   --  1.20  --   TROPONINIHS 55*  --  55*  --   --     Estimated Creatinine Clearance: 59.3 mL/min (by C-G formula based on SCr of 1.2 mg/dL).   Medical History: Past Medical History:  Diagnosis Date   Abnormal colonoscopy 02/23/2021   Abnormal transaminases 02/27/2022   Acute diastolic (congestive) heart failure (HCC) 10/08/2023   Adenomatous polyp of descending colon 02/23/2021   Adverse drug reaction, initial encounter 07/04/2016   07/02/2016: Prevnar 23 vaccine given in right arm  07/02/2016: localized swelling  10/11/20167: Increase in swelling from shoulder to inner aspect of elbow.Red, warm and firm to touch.  No lip or airway swelling or stridor.  2+ brachial and radial pulses right arm.  Capillary refill < 2 seconds     Anemia in chronic illness 01/22/2013   Arthritis, degenerative 01/31/2015   Ascending aortic aneurysm (HCC) 06/27/2022   Benign fibroma of prostate 01/31/2015   BP (high blood pressure) 01/22/2013   BPH (benign prostatic hyperplasia)    CAD (coronary artery disease)    Cannot sleep 01/31/2015   Carcinoma of biliary tract (HCC) 02/15/2013   Diagnosed in 2014, Pt had whipple done at [facility] under  care by Dr. Kirk Peper Oncologist, has f/u every 6-12 months.   Cataract    bilateral sx   Cervical osteoarthritis 10/18/2014   Cholangiocarcinoma of biliary tract (HCC)    Chronic cough 01/31/2015   Chronic myelogenous leukemia (HCC)    Chronic pain 01/31/2015   CML (chronic myelocytic leukemia) (HCC) 10/18/2014   Overview:   Follows with [facility], Dr. Almer Jacobson. On dasatinib , last WBC 9.6.     Compulsive tobacco user syndrome 01/31/2015   Congestive heart failure (CHF) (HCC) 09/26/2023   COPD (chronic obstructive pulmonary disease) (HCC)    uses inhaler   COPD with asthma (HCC) 01/22/2013   Severe obstruction on spirometry FeV1 54% FeV1/FVC 51% 01/31/2015  CXR NAD 01/25/15     Diuretic-induced hypokalemia 05/23/2023   DJD (degenerative joint disease)    Dyslipidemia    Family history of colonic polyps    Family history of prostate cancer    Full thickness rotator cuff tear 08/08/2020   Gout    High coronary artery calcium score 05/30/2023   History of colon polyps 02/23/2021   History of colonic polyps 10/01/2021   IMO SNOMED Dx Update Oct 2024  HLD (hyperlipidemia) 01/31/2015   HTN (hypertension)    on meds   Hypercalcemia 08/10/2020   Hypercholesterolemia    Hypocalcemia 08/27/2021   Hyponatremia 09/19/2023   Impingement syndrome of both shoulders 03/31/2020   Lung cancer (HCC)    Myelogenous leukemia (HCC)    Non-small cell carcinoma of lung (HCC) 01/31/2015   02/13/2015 CT Chest no evidence of recurrence in lung cancer  Stage I adenocarcinoma diagnosed with left upper lobectomy in February 2016     OA (osteoarthritis)    Orthostatic hypotension 10/10/2023   PAD (peripheral artery disease) (HCC)    Personal history of gastric ulcers 02/23/2021   Renal insufficiency    Shoulder pain, left 03/29/2020   Shoulder pain, right 03/29/2020   Tendonitis    patellar   Thoracic aortic aneurysm (HCC) 06/27/2022   Wheezing 01/31/2015    Medications:  No current  facility-administered medications on file prior to encounter.   Current Outpatient Medications on File Prior to Encounter  Medication Sig Dispense Refill   albuterol  (PROVENTIL  HFA;VENTOLIN  HFA) 108 (90 BASE) MCG/ACT inhaler Inhale 2 puffs into the lungs every 6 (six) hours as needed for wheezing or shortness of breath.     allopurinol  (ZYLOPRIM ) 300 MG tablet Take 300 mg by mouth daily.     aspirin  EC 81 MG tablet Take 1 tablet (81 mg total) by mouth daily. Swallow whole.     Calcium Carbonate (CALCIUM 500 PO) Take 1 tablet by mouth 3 (three) times daily.     Cholecalciferol (VITAMIN D ) 50 MCG (2000 UT) tablet Take 2,000 Units by mouth daily.     dasatinib  (SPRYCEL ) 100 MG tablet Take 1 tablet (100 mg total) by mouth daily. 30 tablet 5   diclofenac Sodium (VOLTAREN) 1 % GEL Apply 2 g topically at bedtime as needed (Pain).     fluticasone  (FLONASE ) 50 MCG/ACT nasal spray Place 2 sprays into both nostrils daily.     folic acid  (FOLVITE ) 1 MG tablet Take 1 tablet (1 mg total) by mouth daily.     HYDROcodone -acetaminophen  (NORCO/VICODIN) 5-325 MG tablet Take 1 tablet by mouth 4 (four) times daily as needed for moderate pain (pain score 4-6).     methocarbamol  (ROBAXIN ) 750 MG tablet Take 750 mg by mouth every 6 (six) hours as needed for muscle spasms.     Multiple Vitamin (MULTIVITAMIN WITH MINERALS) TABS tablet Take 1 tablet by mouth daily.     omeprazole  (PRILOSEC) 40 MG capsule TAKE 1 CAPSULE (40 MG TOTAL) BY MOUTH 2 (TWO) TIMES DAILY BEFORE A MEAL. 180 capsule 0   polyethylene glycol (MIRALAX  / GLYCOLAX ) 17 g packet Take 17 g by mouth daily as needed for moderate constipation. 14 each 0   tamsulosin  (FLOMAX ) 0.4 MG CAPS capsule Take 0.8 mg by mouth daily.     thiamine  (VITAMIN B-1) 100 MG tablet Take 1 tablet (100 mg total) by mouth daily.     TRELEGY ELLIPTA 100-62.5-25 MCG/INH AEPB Inhale 1 puff into the lungs daily at 6 (six) AM.     furosemide  (LASIX ) 80 MG tablet Take 80 mg by mouth 2  (two) times daily. (Patient not taking: Reported on 02/05/2024)     irbesartan -hydrochlorothiazide  (AVALIDE) 150-12.5 MG tablet Take 1 tablet by mouth daily. (Patient not taking: Reported on 02/05/2024)     midodrine  (PROAMATINE ) 5 MG tablet Take 3 tablets (15 mg total) by mouth 3 (three) times daily with meals. (Patient not taking: Reported on 02/05/2024) 90 tablet 1   torsemide  (DEMADEX )  20 MG tablet Take 20 mg by mouth daily. (Patient not taking: Reported on 02/05/2024)       Assessment: 73 y.o. male with Hx Paroxysmal Afib - not on anticoagulation PTA with ETOH abuse  Admitted for pneumonia with Afib RVR > started on heparin  drip 1200 uts/hr with heparin  level 0.6 at upper end of goal.  No bleeding, cbc stable   Goal of Therapy:  Heparin  level 0.3-0.7 units/ml Monitor platelets by anticoagulation protocol: Yes   Plan:  Decrease heparin  1100 uts/hr to keep on lower end of goal range  Daily heparin  level and CBC No plans for long term AC per MD discussion    Hortensia Ma Pharm.D. CPP, BCPS Clinical Pharmacist [phone] 02/06/2024 12:44 PM

## 2024-02-06 NOTE — Progress Notes (Signed)
 eLink Physician-Brief Progress Note Patient Name: Kyle Montoya DOB: 05-08-1951 MRN: [id number]   Date of Service  02/06/2024  HPI/Events of Note  Patient with a history of alcohol abuse admitted with pneumonia, hypotension, and hyponatremia.  eICU Interventions  New Patient Evaluation.        Kyle Montoya U Kino Dunsworth 02/06/2024, 3:29 AM

## 2024-02-06 NOTE — ED Notes (Addendum)
 Patient a/o no acute distress noted no complaints offered at this time. Cardiac monitoring in progress patient noted to be bradycardic. B/L sound clear patient has + BS  B/LLE swelling noted. Levo 10mcg/hr (37.5 ml/hr) ns 34ml/hr infusing right wrist via right wrist 22 Heparin  1200units (40ml/hr) infusing via right wrist 22 ns 22ml/hr infusing right wrist safety and comfort maintained. Siderails up x2 all needs met will monitor.

## 2024-02-06 NOTE — ED Notes (Signed)
 Attempted lab draw/IV placement x1 unsuccessful.

## 2024-02-06 NOTE — Progress Notes (Signed)
 eLink Physician-Brief Progress Note Patient Name: Kyle Montoya DOB: 12-15-1950 MRN: [id number]   Date of Service  02/06/2024  HPI/Events of Note  Patient attempting to climb out of bed.  eICU Interventions  PRN Ativan  order changed to 0.5-1 mg Q 2 hours PRN agitated ETOH withdrawal delirium.        Dessa Ledee U Natale Barba 02/06/2024, 8:35 PM

## 2024-02-06 NOTE — Progress Notes (Signed)
 eLink Physician-Brief Progress Note Patient Name: Kyle Montoya DOB: October 11, 1950 MRN: [id number]   Date of Service  02/06/2024  HPI/Events of Note  Patient exhibiting signs of ETOH withdrawal delirium.  eICU Interventions  CIWA protocol ordered.        Kyle Montoya U Tonesha Tsou 02/06/2024, 6:14 AM

## 2024-02-06 NOTE — Progress Notes (Signed)
 CSW received consult for patient. Due to patients current orientation, CSW following to speak to patient/complete consult when appropriate.

## 2024-02-06 NOTE — Plan of Care (Signed)
  Problem: Fluid Volume: Goal: Ability to maintain a balanced intake and output will improve Outcome: Progressing   Problem: Metabolic: Goal: Ability to maintain appropriate glucose levels will improve Outcome: Progressing   Problem: Skin Integrity: Goal: Risk for impaired skin integrity will decrease Outcome: Progressing   Problem: Nutrition: Goal: Adequate nutrition will be maintained Outcome: Progressing

## 2024-02-07 DIAGNOSIS — N179 Acute kidney failure, unspecified: Secondary | ICD-10-CM | POA: Diagnosis not present

## 2024-02-07 DIAGNOSIS — I5033 Acute on chronic diastolic (congestive) heart failure: Secondary | ICD-10-CM | POA: Diagnosis not present

## 2024-02-07 DIAGNOSIS — J189 Pneumonia, unspecified organism: Secondary | ICD-10-CM | POA: Diagnosis not present

## 2024-02-07 DIAGNOSIS — E871 Hypo-osmolality and hyponatremia: Secondary | ICD-10-CM | POA: Diagnosis not present

## 2024-02-07 LAB — HEMOGLOBIN AND HEMATOCRIT, BLOOD
HCT: 33.9 % — ABNORMAL LOW (ref 39.0–52.0)
Hemoglobin: 12 g/dL — ABNORMAL LOW (ref 13.0–17.0)

## 2024-02-07 LAB — BASIC METABOLIC PANEL WITH GFR
Anion gap: 11 (ref 5–15)
BUN: 13 mg/dL (ref 8–23)
CO2: 23 mmol/L (ref 22–32)
Calcium: 7.7 mg/dL — ABNORMAL LOW (ref 8.9–10.3)
Chloride: 94 mmol/L — ABNORMAL LOW (ref 98–111)
Creatinine, Ser: 0.64 mg/dL (ref 0.61–1.24)
GFR, Estimated: >60 mL/min (ref >60)
Glucose, Bld: 93 mg/dL (ref 70–99)
Potassium: 4 mmol/L (ref 3.5–5.1)
Sodium: 128 mmol/L — ABNORMAL LOW (ref 135–145)

## 2024-02-07 LAB — GLUCOSE, CAPILLARY
Glucose-Capillary: 93 mg/dL (ref 70–99)
Glucose-Capillary: 95 mg/dL (ref 70–99)
Glucose-Capillary: 89 mg/dL (ref 70–99)
Glucose-Capillary: 76 mg/dL (ref 70–99)
Glucose-Capillary: 91 mg/dL (ref 70–99)

## 2024-02-07 LAB — HEPARIN LEVEL (UNFRACTIONATED)
Heparin Unfractionated: 0.31 [IU]/mL (ref 0.30–0.70)
Heparin Unfractionated: 0.42 [IU]/mL (ref 0.30–0.70)

## 2024-02-07 LAB — CBC
HCT: 31 % — ABNORMAL LOW (ref 39.0–52.0)
Hemoglobin: 11.1 g/dL — ABNORMAL LOW (ref 13.0–17.0)
MCH: 35.9 pg — ABNORMAL HIGH (ref 26.0–34.0)
MCHC: 35.8 g/dL (ref 30.0–36.0)
MCV: 100.3 fL — ABNORMAL HIGH (ref 80.0–100.0)
Platelets: 226 10*3/uL (ref 150–400)
RBC: 3.09 MIL/uL — ABNORMAL LOW (ref 4.22–5.81)
RDW: 15 % (ref 11.5–15.5)
WBC: 6.5 10*3/uL (ref 4.0–10.5)
nRBC: 0 % (ref 0.0–0.2)

## 2024-02-07 LAB — MAGNESIUM: Magnesium: 1.7 mg/dL (ref 1.7–2.4)

## 2024-02-07 LAB — TYPE AND SCREEN
ABO/RH(D): B POS
Antibody Screen: NEGATIVE

## 2024-02-07 LAB — ABO/RH: ABO/RH(D): B POS

## 2024-02-07 LAB — PHOSPHORUS: Phosphorus: 2.3 mg/dL — ABNORMAL LOW (ref 2.5–4.6)

## 2024-02-07 MED ORDER — POTASSIUM CHLORIDE 10 MEQ/50ML IV SOLN: 10.0000 meq | INTRAVENOUS | Status: DC

## 2024-02-07 MED ORDER — OCTREOTIDE LOAD VIA INFUSION
50.0000 ug | Freq: Once | INTRAVENOUS | Status: AC
  Administered 2024-02-07: 50 ug via INTRAVENOUS
  Filled 2024-02-07: qty 25

## 2024-02-07 MED ORDER — ALBUMIN HUMAN 25 % IV SOLN
12.5000 g | Freq: Once | INTRAVENOUS | Status: AC
  Administered 2024-02-07: 12.5 g via INTRAVENOUS
  Filled 2024-02-07: qty 50

## 2024-02-07 MED ORDER — POTASSIUM CHLORIDE 10 MEQ/100ML IV SOLN
10.0000 meq | INTRAVENOUS | Status: AC
  Administered 2024-02-07 (×4): 10 meq via INTRAVENOUS
  Filled 2024-02-07 (×4): qty 100

## 2024-02-07 MED ORDER — PHENOBARBITAL SODIUM 65 MG/ML IJ SOLN: 32.5000 mg | Freq: Three times a day (TID) | INTRAMUSCULAR | Status: DC

## 2024-02-07 MED ORDER — PHENOBARBITAL SODIUM 130 MG/ML IJ SOLN
97.5000 mg | Freq: Three times a day (TID) | INTRAMUSCULAR | Status: DC
  Administered 2024-02-07 (×2): 97.5 mg via INTRAVENOUS
  Filled 2024-02-07 (×2): qty 1

## 2024-02-07 MED ORDER — LACTATED RINGERS IV BOLUS
500.0000 mL | Freq: Once | INTRAVENOUS | Status: AC
  Administered 2024-02-07: 500 mL via INTRAVENOUS

## 2024-02-07 MED ORDER — PANTOPRAZOLE SODIUM 40 MG IV SOLR
40.0000 mg | Freq: Two times a day (BID) | INTRAVENOUS | Status: DC
  Administered 2024-02-07: 40 mg via INTRAVENOUS
  Filled 2024-02-07: qty 10

## 2024-02-07 MED ORDER — SODIUM CHLORIDE 0.9 % IV SOLN
50.0000 ug/h | INTRAVENOUS | Status: DC
  Administered 2024-02-07 – 2024-02-08 (×2): 50 ug/h via INTRAVENOUS
  Filled 2024-02-07 (×2): qty 1

## 2024-02-07 MED ORDER — PHENOBARBITAL SODIUM 65 MG/ML IJ SOLN: 65.0000 mg | Freq: Three times a day (TID) | INTRAMUSCULAR | Status: DC

## 2024-02-07 MED ORDER — FUROSEMIDE 10 MG/ML IJ SOLN
40.0000 mg | Freq: Once | INTRAMUSCULAR | Status: AC
  Administered 2024-02-07: 40 mg via INTRAVENOUS
  Filled 2024-02-07: qty 4

## 2024-02-07 NOTE — Plan of Care (Signed)

## 2024-02-07 NOTE — Progress Notes (Signed)
 Patient had moderate size BM that is dark maroon color. Patient is on Heparin  gtts at 1,100 units per hour.   Pharmacy and [facility] provider notifed

## 2024-02-07 NOTE — Progress Notes (Signed)
 NAME:  Kyle Montoya, MRN:  [id number], DOB:  1951/09/23, LOS: 2 ADMISSION DATE:  02/05/2024, CONSULTATION DATE:  02/05/2024 REFERRING MD:  Elise Guile, DO, CHIEF COMPLAINT:  weakness and LE edema   History of Present Illness:  73 y/o who has a PMH for HFpEF, Alcohol abuse, BPH, Cervical DDD, Gout, Asthma, COPD, chronic hyponatremia, HTN, Anemia, Ascending Aortic Aneurysm, Gastric Ulcer disease with H. Pylori, paroxysmal Atrial Fibrillation, Lung Cancer stage IA 2016 s/p resection, CML diagnosed 2015 (on Dastinib 100mg  daily)and Cholangiocarcinoma, B12 defeciency, Alcoholic Hepatitis who presented to ED with generalized weakness and increased LE edema.  He drinks 1 pint of Vodka daily and about 3 beers daily. In January he was admitted with acute heart failure and his Lasix  was stopped and he was placed on Torsemide  20mg  daily. In March he was admitted for Hyponatremia and generalized weakness His cxr shows b/l small pleural and possible RLL infiltrate. Pertinent  Medical History  HFpEF (normal echo EF in December 2024), Alcohol abuse, BPH, Cervical DDD, Gout, Asthma, COPD, Hyponatremia, Pneumonia, HTN, Anemia, Ascending Aortic Aneurysm, Gastric Ulcer disease with H. Pylori, paroxysmal Atrial Fibrillation, Lung Cancer stage IA 2016 s/p resection, CML diagnosed 2015 (on Dastinib 100mg  daily)and Cholangiocarcinoma, B12 defeciency, Alcoholic Hepatitis   Significant Hospital Events: Including procedures, antibiotic start and stop dates in addition to other pertinent events   5/15: Admitted to ICU with right lower lobe pneumonia and hypotension 5/16 started withdrawing from alcohol, started on phenobarb.  Continue to require vasopressor support  Interim History / Subjective:  Overnight patient was agitated requiring multiple doses of IV Ativan  Currently on 5 mics of Levophed  Remained afebrile Converted back to sinus rhythm  Objective    Blood pressure 111/67, pulse 82, temperature 97.7 F  (36.5 C), temperature source Axillary, resp. rate (!) 24, height 6' (1.829 m), weight 88.7 kg, SpO2 100%.        Intake/Output Summary (Last 24 hours) at 02/07/2024 0806 Last data filed at 02/07/2024 0700 Gross per 24 hour  Intake 1858.06 ml  Output 6660 ml  Net -4801.94 ml   Filed Weights   02/06/24 0230 02/07/24 0545  Weight: 91.8 kg 88.7 kg    Examination: General: Acute on chronically ill-appearing elderly male, lying on the bed HEENT: [place]/AT, eyes anicteric.  moist mucus membranes Neuro: Sleepy, opens eyes with vocal stimuli, following simple commands Chest: Coarse breath sounds, no wheezes or rhonchi Heart: Regular rate and rhythm, no murmurs or gallops Abdomen: Soft, nontender, nondistended, bowel sounds present  BMP is pending  Patient Lines/Drains/Airways Status     Active Line/Drains/Airways     Name Placement date Placement time Site Days   Peripheral IV 02/06/24 20 G 1.75" Left;Anterior Forearm 02/06/24  0202  Forearm  1   Peripheral IV 02/06/24 22 G Posterior;Right Hand 02/06/24  1545  Hand  1   Peripheral IV 02/06/24 20 G 1.88" Right;Anterior Forearm 02/06/24  2208  Forearm  1   Urethral Catheter Tanya Shelton,RN Latex 16 Fr. 02/06/24  1245  Latex  1   Wound / Incision (Open or Dehisced) 02/06/24 Skin tear Hand Posterior;Right Skin Tear 02/06/24  1700  Hand  1        Resolved problem list   Assessment and Plan  Bilateral lower lobe pneumonia, ?  Aspiration Continue ceftriaxone  azithromycin  to complete 5 days therapy He remained afebrile White count is normal  Acute on chronic HFpEF Echocardiogram in March 2025 showing grade 1 diastolic dysfunction with normal EF Patient is noncompliant  with his diuretics He was given 60 mg of IV Lasix  x 1 with albumin  with that he made 6.6 L of urine with net -4.6 L GDMT is limited by shock  Paroxysmal A-fib Converted back to sinus rhythm Continue IV heparin  infusion for stroke prophylaxis  Shock, combined septic  and cardiogenic Continue vasopressor support with MAP goal 65 On antibiotic and diuretics Started on midodrine  10 mg 3 times daily  Acute kidney injury due to cardiorenal syndrome Serum creatinine improved with diuretics, now down to 0.8 Monitor intake and output Avoid nephrotoxic agent   Hypokalemia  Hypervolemic hyponatremia Continue aggressive electrolyte replacement Serum sodium is slowly improving   COPD, not in exacerbation Bronchodilators as needed.   CAD (coronary artery disease) Continue daily aspirin . Follow-up with cardiology as an outpatient.   Chronic myelogenous leukemia (HCC) Follow-up with oncology as an outpatient.   Alcohol abuse, now going into alcohol withdrawal Patient is actively withdrawing from alcohol Required multiple doses of IV Ativan  overnight Increase phenobarb to high-dose alcohol withdrawal protocol Discontinue all benzodiazepine   Ascending aortic aneurysm [facility]) Follow-up with vascular surgery as an outpatient.   Best Practice (right click and "Reselect all SmartList Selections" daily)   Diet/type: Regular consistency (see orders) DVT prophylaxis systemic heparin  Pressure ulcer(s): N/A GI prophylaxis: N/A Lines: N/A Foley:  N/A Code Status:  full code   Labs   CBC: Recent Labs  Lab 02/05/24 1524 02/05/24 1554 02/06/24 0344 02/07/24 0340  WBC 6.9  --  7.7 6.5  NEUTROABS 5.0  --   --   --   HGB 13.6 13.6 12.7* 11.1*  HCT 38.0* 40.0 35.2* 31.0*  MCV 98.4  --  97.8 100.3*  PLT 286  --  312 226    Basic Metabolic Panel: Recent Labs  Lab 02/05/24 1524 02/05/24 1554 02/05/24 1911 02/06/24 0344 02/06/24 1741  NA 123* 122*  --  125* 128*  K 3.4* 3.3*  --  2.9* 3.5  CL 87*  --   --  90* 92*  CO2 24  --   --  23 26  GLUCOSE 89  --   --  105* 90  BUN 25*  --   --  24* 16  CREATININE 1.50*  --   --  1.20 0.83  CALCIUM 7.7*  --   --  7.3* 7.9*  MG  --   --  1.9 1.8  --   PHOS  --   --   --  4.3  --    GFR: Estimated  Creatinine Clearance: 85.7 mL/min (by C-G formula based on SCr of 0.83 mg/dL). Recent Labs  Lab 02/05/24 1524 02/05/24 1947 02/06/24 0344 02/07/24 0340  WBC 6.9  --  7.7 6.5  LATICACIDVEN  --  1.9  --   --     Liver Function Tests: Recent Labs  Lab 02/05/24 1524 02/06/24 0344  AST 61* 54*  ALT 26 23  ALKPHOS 69 61  BILITOT 0.7 0.7  PROT 5.7* 5.2*  ALBUMIN  2.4* 2.4*   No results for input(s): "LIPASE", "AMYLASE" in the last 168 hours. No results for input(s): "AMMONIA" in the last 168 hours.  ABG    Component Value Date/Time   HCO3 24.1 02/05/2024 1554   TCO2 25 02/05/2024 1554   ACIDBASEDEF 1.0 02/05/2024 1554   O2SAT 54 02/05/2024 1554     Coagulation Profile: Recent Labs  Lab 02/06/24 0344  INR 1.1    Cardiac Enzymes: No results for input(s): "CKTOTAL", "CKMB", "CKMBINDEX", "  TROPONINI" in the last 168 hours.  HbA1C: Hemoglobin A1C  Date/Time Value Ref Range Status  12/13/2022 12:00 AM 4.5  Final   Hgb A1c MFr Bld  Date/Time Value Ref Range Status  02/06/2024 03:44 AM 4.6 (L) 4.8 - 5.6 % Final    Comment:    (NOTE) Pre diabetes:          5.7%-6.4%  Diabetes:              >6.4%  Glycemic control for   <7.0% adults with diabetes   09/25/2020 09:17 AM 4.7 (L) 4.8 - 5.6 % Final    Comment:    (NOTE) Pre diabetes:          5.7%-6.4%  Diabetes:              >6.4%  Glycemic control for   <7.0% adults with diabetes     CBG: Recent Labs  Lab 02/06/24 1609 02/06/24 1630 02/06/24 2007 02/06/24 2333 02/07/24 0347  GLUCAP 68* 72 92 95 93    The patient is critically ill due to combined septic/cardiogenic shock.  Critical care was necessary to treat or prevent imminent or life-threatening deterioration.  Critical care was time spent personally by me on the following activities: development of treatment plan with patient and/or surrogate as well as nursing, discussions with consultants, evaluation of patient's response to treatment, examination  of patient, obtaining history from patient or surrogate, ordering and performing treatments and interventions, ordering and review of laboratory studies, ordering and review of radiographic studies, pulse oximetry, re-evaluation of patient's condition and participation in multidisciplinary rounds.   During this encounter critical care time was devoted to patient care services described in this note for 36 minutes.     Trevor Fudge, MD [facility] See Amion for pager If no response to pager, please call [phone] until 7pm After 7pm, Please call E-link [phone]

## 2024-02-07 NOTE — Progress Notes (Signed)
 eLink Physician-Brief Progress Note Patient Name: Alastor Kneale DOB: 26-Nov-1950 MRN: [id number]   Date of Service  02/07/2024  HPI/Events of Note  73 year old male that initially presented with shock, cardiorenal syndrome, and bilateral lower lobe pneumonia secondary to aspiration in the setting of alcohol use and withdrawal.  Placed on heparin  drip for A-fib.  Notified about a bloody bowel movement.  Hepatic steatosis noted on CT angiogram.  Ceftriaxone  in place.  Patient is agitated on appearance and more tachycardic, but hemodynamics otherwise stable.  Hemoglobin 11.1 this morning.  eICU Interventions  Discontinue heparin  drip.  Check hemoglobin every 6 hours to trend.  Type and screen.  Start IV PPI twice daily and octreotide infusion.  LR 500 cc bolus  May need GI evaluation if there is a substantial bleed.     Intervention Category Intermediate Interventions: Bleeding - evaluation and treatment with blood products  Tanique Matney 02/07/2024, 9:22 PM

## 2024-02-07 NOTE — Progress Notes (Addendum)
 PHARMACY - ANTICOAGULATION CONSULT NOTE  Pharmacy Consult for Heparin  Indication: atrial fibrillation  Allergies  Allergen Reactions   Pneumococcal Vaccines Swelling    Patient Measurements: Height: 6' (182.9 cm) Weight: 88.7 kg (195 lb 8.8 oz) IBW/kg (Calculated) : 77.6  Vital Signs: Temp: 97.7 F (36.5 C) (05/17 0345) Temp Source: Axillary (05/17 0345) BP: 95/59 (05/17 0600) Pulse Rate: 75 (05/17 0600)  Labs: Recent Labs    02/05/24 1524 02/05/24 1554 02/05/24 1911 02/06/24 0344 02/06/24 1013 02/06/24 1741 02/07/24 0340 02/07/24 0448  HGB 13.6 13.6  --  12.7*  --   --  11.1*  --   HCT 38.0* 40.0  --  35.2*  --   --  31.0*  --   PLT 286  --   --  312  --   --  226  --   APTT  --   --   --  125*  --   --   --   --   LABPROT  --   --   --  14.6  --   --   --   --   INR  --   --   --  1.1  --   --   --   --   HEPARINUNFRC  --   --   --   --  0.61  --   --  0.31  CREATININE 1.50*  --   --  1.20  --  0.83  --   --   TROPONINIHS 55*  --  55*  --   --   --   --   --     Estimated Creatinine Clearance: 85.7 mL/min (by C-G formula based on SCr of 0.83 mg/dL).   Medical History: Past Medical History:  Diagnosis Date   Abnormal colonoscopy 02/23/2021   Abnormal transaminases 02/27/2022   Acute diastolic (congestive) heart failure (HCC) 10/08/2023   Adenomatous polyp of descending colon 02/23/2021   Adverse drug reaction, initial encounter 07/04/2016   07/02/2016: Prevnar 23 vaccine given in right arm  07/02/2016: localized swelling  10/11/20167: Increase in swelling from shoulder to inner aspect of elbow.Red, warm and firm to touch.  No lip or airway swelling or stridor.  2+ brachial and radial pulses right arm.  Capillary refill < 2 seconds     Anemia in chronic illness 01/22/2013   Arthritis, degenerative 01/31/2015   Ascending aortic aneurysm (HCC) 06/27/2022   Benign fibroma of prostate 01/31/2015   BP (high blood pressure) 01/22/2013   BPH (benign prostatic  hyperplasia)    CAD (coronary artery disease)    Cannot sleep 01/31/2015   Carcinoma of biliary tract (HCC) 02/15/2013   Diagnosed in 2014, Pt had whipple done at [facility], under care by Dr. Kirk Peper Oncologist, has f/u every 6-12 months.   Cataract    bilateral sx   Cervical osteoarthritis 10/18/2014   Cholangiocarcinoma of biliary tract (HCC)    Chronic cough 01/31/2015   Chronic myelogenous leukemia (HCC)    Chronic pain 01/31/2015   CML (chronic myelocytic leukemia) (HCC) 10/18/2014   Overview:   Follows with [facility], Dr. Almer Jacobson. On dasatinib , last WBC 9.6.     Compulsive tobacco user syndrome 01/31/2015   Congestive heart failure (CHF) (HCC) 09/26/2023   COPD (chronic obstructive pulmonary disease) (HCC)    uses inhaler   COPD with asthma (HCC) 01/22/2013   Severe obstruction on spirometry FeV1 54% FeV1/FVC 51% 01/31/2015  CXR NAD 01/25/15  Diuretic-induced hypokalemia 05/23/2023   DJD (degenerative joint disease)    Dyslipidemia    Family history of colonic polyps    Family history of prostate cancer    Full thickness rotator cuff tear 08/08/2020   Gout    High coronary artery calcium score 05/30/2023   History of colon polyps 02/23/2021   History of colonic polyps 10/01/2021   IMO SNOMED Dx Update Oct 2024     HLD (hyperlipidemia) 01/31/2015   HTN (hypertension)    on meds   Hypercalcemia 08/10/2020   Hypercholesterolemia    Hypocalcemia 08/27/2021   Hyponatremia 09/19/2023   Impingement syndrome of both shoulders 03/31/2020   Lung cancer (HCC)    Myelogenous leukemia (HCC)    Non-small cell carcinoma of lung (HCC) 01/31/2015   02/13/2015 CT Chest no evidence of recurrence in lung cancer  Stage I adenocarcinoma diagnosed with left upper lobectomy in February 2016     OA (osteoarthritis)    Orthostatic hypotension 10/10/2023   PAD (peripheral artery disease) (HCC)    Personal history of gastric ulcers 02/23/2021   Renal insufficiency     Shoulder pain, left 03/29/2020   Shoulder pain, right 03/29/2020   Tendonitis    patellar   Thoracic aortic aneurysm (HCC) 06/27/2022   Wheezing 01/31/2015    Medications:  No current facility-administered medications on file prior to encounter.   Current Outpatient Medications on File Prior to Encounter  Medication Sig Dispense Refill   albuterol  (PROVENTIL  HFA;VENTOLIN  HFA) 108 (90 BASE) MCG/ACT inhaler Inhale 2 puffs into the lungs every 6 (six) hours as needed for wheezing or shortness of breath.     allopurinol  (ZYLOPRIM ) 300 MG tablet Take 300 mg by mouth daily.     aspirin  EC 81 MG tablet Take 1 tablet (81 mg total) by mouth daily. Swallow whole.     Calcium Carbonate (CALCIUM 500 PO) Take 1 tablet by mouth 3 (three) times daily.     Cholecalciferol (VITAMIN D ) 50 MCG (2000 UT) tablet Take 2,000 Units by mouth daily.     dasatinib  (SPRYCEL ) 100 MG tablet Take 1 tablet (100 mg total) by mouth daily. 30 tablet 5   diclofenac Sodium (VOLTAREN) 1 % GEL Apply 2 g topically at bedtime as needed (Pain).     fluticasone  (FLONASE ) 50 MCG/ACT nasal spray Place 2 sprays into both nostrils daily.     folic acid  (FOLVITE ) 1 MG tablet Take 1 tablet (1 mg total) by mouth daily.     HYDROcodone -acetaminophen  (NORCO/VICODIN) 5-325 MG tablet Take 1 tablet by mouth 4 (four) times daily as needed for moderate pain (pain score 4-6).     methocarbamol  (ROBAXIN ) 750 MG tablet Take 750 mg by mouth every 6 (six) hours as needed for muscle spasms.     Multiple Vitamin (MULTIVITAMIN WITH MINERALS) TABS tablet Take 1 tablet by mouth daily.     omeprazole  (PRILOSEC) 40 MG capsule TAKE 1 CAPSULE (40 MG TOTAL) BY MOUTH 2 (TWO) TIMES DAILY BEFORE A MEAL. 180 capsule 0   polyethylene glycol (MIRALAX  / GLYCOLAX ) 17 g packet Take 17 g by mouth daily as needed for moderate constipation. 14 each 0   tamsulosin  (FLOMAX ) 0.4 MG CAPS capsule Take 0.8 mg by mouth daily.     thiamine  (VITAMIN B-1) 100 MG tablet Take 1  tablet (100 mg total) by mouth daily.     TRELEGY ELLIPTA 100-62.5-25 MCG/INH AEPB Inhale 1 puff into the lungs daily at 6 (six) AM.     furosemide  (  LASIX ) 80 MG tablet Take 80 mg by mouth 2 (two) times daily. (Patient not taking: Reported on 02/05/2024)     irbesartan -hydrochlorothiazide  (AVALIDE) 150-12.5 MG tablet Take 1 tablet by mouth daily. (Patient not taking: Reported on 02/05/2024)     midodrine  (PROAMATINE ) 5 MG tablet Take 3 tablets (15 mg total) by mouth 3 (three) times daily with meals. (Patient not taking: Reported on 02/05/2024) 90 tablet 1   torsemide  (DEMADEX ) 20 MG tablet Take 20 mg by mouth daily. (Patient not taking: Reported on 02/05/2024)       Assessment: 73 y.o. male with Hx Paroxysmal Afib - not on anticoagulation PTA  Admitted for pneumonia with Afib RVR > started on heparin  drip 1200 uts/hr.  Heparin  level is on lower end of therapeutic range at 0.31 on UFH IV 1100 units/hour. Hgb is down at 11.1 mg/dL. Pltc stable. No issues with infusion or overt signs of bleeding per RN    Confirmatory heparin  level of 0.42 is therapeutic.  Goal of Therapy:  Heparin  level 0.3-0.7 units/ml Monitor platelets by anticoagulation protocol: Yes   Plan:  Continue UFH IV infusion at 1100 uts/hr  Daily heparin  level and CBC No plans for long term AC per previous discussion with MD   Albino Alu, PharmD PGY2 Cardiology Pharmacy Resident [phone] 02/07/2024 6:25 AM

## 2024-02-08 DIAGNOSIS — I7121 Aneurysm of the ascending aorta, without rupture: Secondary | ICD-10-CM

## 2024-02-08 DIAGNOSIS — I5033 Acute on chronic diastolic (congestive) heart failure: Secondary | ICD-10-CM

## 2024-02-08 DIAGNOSIS — N179 Acute kidney failure, unspecified: Secondary | ICD-10-CM

## 2024-02-08 DIAGNOSIS — I159 Secondary hypertension, unspecified: Secondary | ICD-10-CM | POA: Diagnosis not present

## 2024-02-08 DIAGNOSIS — C249 Malignant neoplasm of biliary tract, unspecified: Secondary | ICD-10-CM

## 2024-02-08 DIAGNOSIS — E785 Hyperlipidemia, unspecified: Secondary | ICD-10-CM

## 2024-02-08 DIAGNOSIS — J189 Pneumonia, unspecified organism: Secondary | ICD-10-CM | POA: Diagnosis not present

## 2024-02-08 DIAGNOSIS — I2510 Atherosclerotic heart disease of native coronary artery without angina pectoris: Secondary | ICD-10-CM

## 2024-02-08 DIAGNOSIS — F10939 Alcohol use, unspecified with withdrawal, unspecified: Secondary | ICD-10-CM

## 2024-02-08 DIAGNOSIS — J4489 Other specified chronic obstructive pulmonary disease: Secondary | ICD-10-CM

## 2024-02-08 DIAGNOSIS — E1169 Type 2 diabetes mellitus with other specified complication: Secondary | ICD-10-CM

## 2024-02-08 HISTORY — DX: Acute on chronic diastolic (congestive) heart failure: I50.33

## 2024-02-08 HISTORY — DX: Alcohol use, unspecified with withdrawal, unspecified: F10.939

## 2024-02-08 LAB — GLUCOSE, CAPILLARY
Glucose-Capillary: 114 mg/dL — ABNORMAL HIGH (ref 70–99)
Glucose-Capillary: 121 mg/dL — ABNORMAL HIGH (ref 70–99)
Glucose-Capillary: 129 mg/dL — ABNORMAL HIGH (ref 70–99)
Glucose-Capillary: 71 mg/dL (ref 70–99)
Glucose-Capillary: 84 mg/dL (ref 70–99)
Glucose-Capillary: 98 mg/dL (ref 70–99)

## 2024-02-08 LAB — CBC
HCT: 28.5 % — ABNORMAL LOW (ref 39.0–52.0)
Hemoglobin: 10.4 g/dL — ABNORMAL LOW (ref 13.0–17.0)
MCH: 36.2 pg — ABNORMAL HIGH (ref 26.0–34.0)
MCHC: 36.5 g/dL — ABNORMAL HIGH (ref 30.0–36.0)
MCV: 99.3 fL (ref 80.0–100.0)
Platelets: 256 10*3/uL (ref 150–400)
RBC: 2.87 MIL/uL — ABNORMAL LOW (ref 4.22–5.81)
RDW: 14.9 % (ref 11.5–15.5)
WBC: 6.2 10*3/uL (ref 4.0–10.5)
nRBC: 0 % (ref 0.0–0.2)

## 2024-02-08 LAB — HEPARIN LEVEL (UNFRACTIONATED): Heparin Unfractionated: <0.1 [IU]/mL — ABNORMAL LOW (ref 0.30–0.70)

## 2024-02-08 LAB — HEMOGLOBIN AND HEMATOCRIT, BLOOD
HCT: 25.6 % — ABNORMAL LOW (ref 39.0–52.0)
Hemoglobin: 9 g/dL — ABNORMAL LOW (ref 13.0–17.0)

## 2024-02-08 LAB — BASIC METABOLIC PANEL WITH GFR
Anion gap: 10 (ref 5–15)
BUN: 19 mg/dL (ref 8–23)
CO2: 23 mmol/L (ref 22–32)
Calcium: 8.1 mg/dL — ABNORMAL LOW (ref 8.9–10.3)
Chloride: 96 mmol/L — ABNORMAL LOW (ref 98–111)
Creatinine, Ser: 0.81 mg/dL (ref 0.61–1.24)
GFR, Estimated: >60 mL/min (ref >60)
Glucose, Bld: 127 mg/dL — ABNORMAL HIGH (ref 70–99)
Potassium: 4.5 mmol/L (ref 3.5–5.1)
Sodium: 129 mmol/L — ABNORMAL LOW (ref 135–145)

## 2024-02-08 LAB — OCCULT BLOOD X 1 CARD TO LAB, STOOL: Fecal Occult Bld: POSITIVE — AB

## 2024-02-08 MED ORDER — ENSURE ENLIVE PO LIQD: 237.0000 mL | Freq: Two times a day (BID) | ORAL | Status: DC

## 2024-02-08 MED ORDER — PHENOBARBITAL 32.4 MG PO TABS
97.2000 mg | ORAL_TABLET | Freq: Three times a day (TID) | ORAL | Status: DC
  Administered 2024-02-08: 97.2 mg via ORAL
  Filled 2024-02-08: qty 3

## 2024-02-08 MED ORDER — PHENOBARBITAL SODIUM 65 MG/ML IJ SOLN: 65.0000 mg | Freq: Three times a day (TID) | INTRAMUSCULAR | Status: DC

## 2024-02-08 MED ORDER — INSULIN ASPART 100 UNIT/ML IJ SOLN
0.0000 [IU] | Freq: Three times a day (TID) | INTRAMUSCULAR | Status: DC
  Administered 2024-02-09: 1 [IU] via SUBCUTANEOUS

## 2024-02-08 MED ORDER — PHENOBARBITAL 32.4 MG PO TABS: 32.4000 mg | ORAL_TABLET | Freq: Three times a day (TID) | ORAL | Status: DC

## 2024-02-08 MED ORDER — PHENOBARBITAL SODIUM 65 MG/ML IJ SOLN: 32.5000 mg | Freq: Three times a day (TID) | INTRAMUSCULAR | Status: DC

## 2024-02-08 MED ORDER — LORAZEPAM 1 MG PO TABS: 1.0000 mg | ORAL_TABLET | ORAL | Status: DC | PRN

## 2024-02-08 MED ORDER — PANTOPRAZOLE SODIUM 40 MG PO TBEC
40.0000 mg | DELAYED_RELEASE_TABLET | Freq: Two times a day (BID) | ORAL | Status: DC
  Administered 2024-02-08 – 2024-02-11 (×4): 40 mg via ORAL
  Filled 2024-02-08 (×6): qty 1

## 2024-02-08 MED ORDER — LORAZEPAM 2 MG/ML IJ SOLN: 1.0000 mg | INTRAMUSCULAR | Status: DC | PRN

## 2024-02-08 MED ORDER — PHENOBARBITAL 32.4 MG PO TABS: 64.8000 mg | ORAL_TABLET | Freq: Three times a day (TID) | ORAL | Status: DC

## 2024-02-08 MED ORDER — PHENOBARBITAL SODIUM 65 MG/ML IJ SOLN
65.0000 mg | Freq: Three times a day (TID) | INTRAMUSCULAR | Status: AC
Start: 2024-02-08 — End: 2024-02-10
  Administered 2024-02-08 – 2024-02-10 (×5): 65 mg via INTRAVENOUS
  Filled 2024-02-08 (×6): qty 1

## 2024-02-08 MED ORDER — PHENOBARBITAL SODIUM 130 MG/ML IJ SOLN: 97.5000 mg | Freq: Three times a day (TID) | INTRAMUSCULAR | Status: DC

## 2024-02-08 MED ORDER — PHENOBARBITAL SODIUM 65 MG/ML IJ SOLN
97.5000 mg | Freq: Three times a day (TID) | INTRAMUSCULAR | Status: DC
  Administered 2024-02-08: 97.5 mg via INTRAVENOUS
  Filled 2024-02-08: qty 2

## 2024-02-08 NOTE — Assessment & Plan Note (Signed)
No chest pain, no acute coronary syndrome.  

## 2024-02-08 NOTE — Assessment & Plan Note (Signed)
 Follow up as outpatient.

## 2024-02-08 NOTE — Plan of Care (Signed)
  Problem: Nutritional: Goal: Maintenance of adequate nutrition will improve Outcome: Not Progressing

## 2024-02-08 NOTE — Progress Notes (Addendum)
 Progress Note   Patient: Kyle Montoya ZOX:[id number] DOB: 03-04-1951 DOA: 02/05/2024     3 DOS: the patient was seen and examined on 02/08/2024   Brief hospital course: [name] was admitted to the hospital with the working diagnosis of septic shock due to pneumonia, complicated with heart failure.   73 yo male with the past medical history of heart failure, alcohol abuse, COPD, hypertensin, ascending aortic aneurysm, history od cancer (lung cancer sp resection and cholangiocarcinoma) who presented with generalized weakness and lower extremity edema.  On his initial physical examination his blood pressure was 80/59, HR 81, RR 20 and 02 saturation 98%. Lungs with no wheezing or rales, heart with S1 and S2 present irregularly irregular with no gallops or rubs, abdomen with no distention and positive lower extremity edema.   VBG 7,39/39/28/25/ 54% Na 123, K 3,4 CL 87 bicarbonate 24, glucose 89 bun 25 cr 1,50  AST 61 ALT 26 BNP 149  High sensitive troponin 56 and 55  Lactic acid 1,9  Wbc 6,9 hgb 13,6 plt 286   Urine analysis SG 1,005, negative protein, negative leukocytes and negative hgb   Chest radiograph with mild cardiomegaly, bilateral hilar vascular congestion with no infiltrates, positive right pleural effusion with possible right base atelectasis.   EKG 75 bpm, left axis deviation, qtc 490, right bundle branch block, sinus rhythm with second degree AV block type 1, positive PVC, no significant ST segment or T wave changes.   Patient had IV fluids resuscitation, antibiotics and resumed on midodrine .  5/16 started withdrawing from alcohol, started on phenobarb.  Continue to require vasopressor support   05/18 transfer to [facility].   Assessment and Plan: * Right lower lobe pneumonia Septic shock has resolved.  Wbc 6,2 and afebrile.   Plan to continue antibiotic therapy with ceftriaxone .  Aspiration precautions.   Acute on chronic diastolic CHF (congestive heart failure)  (HCC) Echocardiogram with preserved LV systolic function EF 65 to 70%, RV systolic function preserved, no significant valvular disease.   Patient with negative fluid balance, since admission -6,167 ml Urine output is 2,445 ml Systolic blood pressure 90 mmHg range.   Currently loop diuretic on hold Continue blood pressure support with midodrine .  Telemetry sinus rhythm, EKG sinus rhythm. Considering risk of bleeding and dark stools will hold on anticoagulation for now.   Alcohol withdrawal (HCC) Patient will continue phenobarbital  IV per protocol, there is a question about patient able to take his medications per po.  Will consult speech therapy for evaluation.  Continue neuro checks.   HTN (hypertension) Continue blood pressure monitoring He had 500 cc bolus LR last night.  Continue midodrine    AKI (acute kidney injury) (HCC) Hyponatremia. hypokalemia  Renal function with serum cr at 0,81 with K at 4,5 and serum bicarbonate at 23  Na 129   Plan to continue close monitoring renal function and electrolytes.  Holding on loop diuretic for now.   CAD (coronary artery disease) No chest pain, no acute coronary syndrome   COPD with asthma (HCC) No signs of exacerbation  Continue with bronchodilator therapy   Type 2 diabetes mellitus with hyperlipidemia (HCC) Continue insulin  sliding scale for glucose cover and monitoring (will decrease dose)  Fasting glucose 127 mg/dl today  Capillary 811, 914 and 84  Continue statin therapy   Ascending aortic aneurysm (HCC) Follow up as outpatient   Carcinoma of biliary tract (HCC) Follow up as outpatient.         Subjective: Patient with no chest  pain or dyspnea, he has been very weak and nursing concerned about his ability to maintain po medications   Physical Exam: Vitals:   02/08/24 0201 02/08/24 0609 02/08/24 0733 02/08/24 1117  BP:   95/77 (!) 91/52  Pulse:   (!) 102   Resp: 20 19 18    Temp:   97.8 F (36.6 C) 97.9 F  (36.6 C)  TempSrc:   Axillary Axillary  SpO2: 100%  97%   Weight:  89.2 kg    Height:       Neurology somnolent but easy to arouse, no tremors or agitations, has mittens in place ENT with mild pallor, dry mucous membranes Cardiovascular with S1 and S2 present and regular with no gallops, rubs or murmurs Respiratory with no rales or wheezing, no rhonchi  Abdomen with no distention  No lower extremity edema  Data Reviewed:    Family Communication: no family at the bedside   Disposition: Status is: Inpatient Remains inpatient appropriate because: alcohol withdrawal   Planned Discharge Destination: Home     Author: Albertus Alt, MD 02/08/2024 12:52 PM  For on call review [email or web address].

## 2024-02-08 NOTE — Assessment & Plan Note (Addendum)
 Continue midodrine  for blood pressure control.

## 2024-02-08 NOTE — Plan of Care (Signed)
   Problem: Education: Goal: Ability to describe self-care measures that may prevent or decrease complications (Diabetes Survival Skills Education) will improve Outcome: Progressing   Problem: Coping: Goal: Ability to adjust to condition or change in health will improve Outcome: Progressing   Problem: Fluid Volume: Goal: Ability to maintain a balanced intake and output will improve Outcome: Progressing

## 2024-02-08 NOTE — Assessment & Plan Note (Addendum)
 Positive oversedation today, will hold on phenobarbital  and will add as needed oral lorazepam .  Continue neuro checks Continue thiamine  and multivitamins

## 2024-02-08 NOTE — Assessment & Plan Note (Addendum)
 Hyponatremia. hypokalemia  Stable renal function with serum cr at 0,80 with K at 4,1 and serum bicarbonate at 21  Na 134 and Mg 1,7   Plan to continue close monitoring renal function and electrolytes.  Holding on loop diuretic for now.

## 2024-02-08 NOTE — Evaluation (Signed)
 Clinical/Bedside Swallow Evaluation Patient Details  Name: Kyle Montoya MRN: [id number] Date of Birth: Dec 17, 1950  Today's Date: 02/08/2024 Time: SLP Start Time (ACUTE ONLY): 1515 SLP Stop Time (ACUTE ONLY): 1530 SLP Time Calculation (min) (ACUTE ONLY): 15 min  Past Medical History:  Past Medical History:  Diagnosis Date   Abnormal colonoscopy 02/23/2021   Abnormal transaminases 02/27/2022   Acute diastolic (congestive) heart failure (HCC) 10/08/2023   Adenomatous polyp of descending colon 02/23/2021   Adverse drug reaction, initial encounter 07/04/2016   07/02/2016: Prevnar 23 vaccine given in right arm  07/02/2016: localized swelling  10/11/20167: Increase in swelling from shoulder to inner aspect of elbow.Red, warm and firm to touch.  No lip or airway swelling or stridor.  2+ brachial and radial pulses right arm.  Capillary refill < 2 seconds     Anemia in chronic illness 01/22/2013   Arthritis, degenerative 01/31/2015   Ascending aortic aneurysm (HCC) 06/27/2022   Benign fibroma of prostate 01/31/2015   BP (high blood pressure) 01/22/2013   BPH (benign prostatic hyperplasia)    CAD (coronary artery disease)    Cannot sleep 01/31/2015   Carcinoma of biliary tract (HCC) 02/15/2013   Diagnosed in 2014, Pt had whipple done at [facility], under care by Dr. Kirk Peper Oncologist, has f/u every 6-12 months.   Cataract    bilateral sx   Cervical osteoarthritis 10/18/2014   Cholangiocarcinoma of biliary tract (HCC)    Chronic cough 01/31/2015   Chronic myelogenous leukemia (HCC)    Chronic pain 01/31/2015   CML (chronic myelocytic leukemia) (HCC) 10/18/2014   Overview:   Follows with [facility], Dr. Almer Jacobson. On dasatinib , last WBC 9.6.     Compulsive tobacco user syndrome 01/31/2015   Congestive heart failure (CHF) (HCC) 09/26/2023   COPD (chronic obstructive pulmonary disease) (HCC)    uses inhaler   COPD with asthma (HCC) 01/22/2013   Severe obstruction on  spirometry FeV1 54% FeV1/FVC 51% 01/31/2015  CXR NAD 01/25/15     Diuretic-induced hypokalemia 05/23/2023   DJD (degenerative joint disease)    Dyslipidemia    Family history of colonic polyps    Family history of prostate cancer    Full thickness rotator cuff tear 08/08/2020   Gout    High coronary artery calcium score 05/30/2023   History of colon polyps 02/23/2021   History of colonic polyps 10/01/2021   IMO SNOMED Dx Update Oct 2024     HLD (hyperlipidemia) 01/31/2015   HTN (hypertension)    on meds   Hypercalcemia 08/10/2020   Hypercholesterolemia    Hypocalcemia 08/27/2021   Hyponatremia 09/19/2023   Impingement syndrome of both shoulders 03/31/2020   Lung cancer (HCC)    Myelogenous leukemia (HCC)    Non-small cell carcinoma of lung (HCC) 01/31/2015   02/13/2015 CT Chest no evidence of recurrence in lung cancer  Stage I adenocarcinoma diagnosed with left upper lobectomy in February 2016     OA (osteoarthritis)    Orthostatic hypotension 10/10/2023   PAD (peripheral artery disease) (HCC)    Personal history of gastric ulcers 02/23/2021   Renal insufficiency    Shoulder pain, left 03/29/2020   Shoulder pain, right 03/29/2020   Tendonitis    patellar   Thoracic aortic aneurysm (HCC) 06/27/2022   Wheezing 01/31/2015   Past Surgical History:  Past Surgical History:  Procedure Laterality Date   BIOPSY  04/30/2021   Procedure: BIOPSY;  Surgeon: Normie Becton., MD;  Location: [facility];  Service: Gastroenterology;;  Bowel duct surg  2014   Bowel cancer   CARDIAC CATHETERIZATION  2000   CHOLECYSTECTOMY     COLONOSCOPY  06/23/2015   mild sigmoid diverticulosis. small external and internal hemorrhoids. otherwise normal colonoscopy to terminal ileum   COLONOSCOPY WITH PROPOFOL  N/A 04/30/2021   Procedure: COLONOSCOPY WITH PROPOFOL ;  Surgeon: Brice Campi Albino Alu., MD;  Location: [facility];  Service: Gastroenterology;  Laterality: N/A;   ENDOSCOPIC MUCOSAL  RESECTION N/A 04/30/2021   Procedure: ENDOSCOPIC MUCOSAL RESECTION;  Surgeon: Brice Campi Albino Alu., MD;  Location: [facility];  Service: Gastroenterology;  Laterality: N/A;   ESOPHAGOGASTRODUODENOSCOPY (EGD) WITH PROPOFOL  N/A 04/30/2021   Procedure: ESOPHAGOGASTRODUODENOSCOPY (EGD) WITH PROPOFOL ;  Surgeon: Brice Campi Albino Alu., MD;  Location: [facility];  Service: Gastroenterology;  Laterality: N/A;   HEMOSTASIS CLIP PLACEMENT  04/30/2021   Procedure: HEMOSTASIS CLIP PLACEMENT;  Surgeon: Normie Becton., MD;  Location: [facility];  Service: Gastroenterology;;   LUNG CANCER SURGERY  09/2014   partial lobectomy   POLYPECTOMY  04/30/2021   Procedure: POLYPECTOMY;  Surgeon: Mansouraty, Albino Alu., MD;  Location: [facility];  Service: Gastroenterology;;   SHOULDER OPEN ROTATOR CUFF REPAIR Right 09/27/2020   Procedure: Right shoulder mini open rotator cuff repair, distal clavicle resection;  Surgeon: Orvan Blanch, MD;  Location: [facility];  Service: Orthopedics;  Laterality: Right;  90 mins  Choice with Block anesthesia   SUBMUCOSAL LIFTING INJECTION  04/30/2021   Procedure: SUBMUCOSAL LIFTING INJECTION;  Surgeon: Normie Becton., MD;  Location: [facility];  Service: Gastroenterology;;   UPPER GASTROINTESTINAL ENDOSCOPY     WHIPPLE PROCEDURE  02/25/2013   Procedure: WHIPPLE PROCEDURE; Surgeon: Kirk Peper, MD; Location: [facility]; Service: General; Laterality: N/A;   HPI:  Patient is a 73 y.o. male with PMH: ETOH abuse, COPD, HTN, heart failure, ascending aortic aneurysm, h/o lung cancer s/p resection and cholangiocarcinoma. 2022 EGD reported that the distal esophagus was moderately tortuous. He presented to the hospital on 02/05/2024 with generalized weakness and lower extremity edema. On 5/16 he started withdrawing form alcohol and started on phenobarb and required vasopressor support. He was foudn to have a RLL PNA. SLP swallow evaluation ordered on 02/08/24 due to RN  noting patient to exhibit coughing when drinking liquids.    Assessment / Plan / Recommendation  Clinical Impression  Patient presents with clinical s/s of dysphagia as per this bedside swallow evaluation. Patient was lethargic and had difficulty maintaining alertness, so swallow evaluation was limited to sips of thin liquids only. Patient with mildly delayed cough response to majority of sips of thin liquids. (this is what RN had reported to SLP) As patient does not appear adequately alert for PO's and there is concern for safety with PO intake, SLP recommending NPO at this time, but allow for necessary medications crushed in puree. SLP will follow for PO readiness. SLP Visit Diagnosis: Dysphagia, unspecified (R13.10)    Aspiration Risk  Mild aspiration risk;Moderate aspiration risk    Diet Recommendation NPO    Medication Administration: Via alternative means    Other  Recommendations Oral Care Recommendations: Oral care BID;Staff/trained caregiver to provide oral care    Recommendations for follow up therapy are one component of a multi-disciplinary discharge planning process, led by the attending physician.  Recommendations may be updated based on patient status, additional functional criteria and insurance authorization.  Follow up Recommendations Other (comment) (TBD)      Assistance Recommended at Discharge    Functional Status Assessment Patient has had a recent decline  in their functional status and demonstrates the ability to make significant improvements in function in a reasonable and predictable amount of time.  Frequency and Duration min 2x/week  1 week       Prognosis Prognosis for improved oropharyngeal function: Good Barriers to Reach Goals: Cognitive deficits      Swallow Study   General Date of Onset: 02/08/24 HPI: Patient is a 73 y.o. male with PMH: ETOH abuse, COPD, HTN, heart failure, ascending aortic aneurysm, h/o lung cancer s/p resection and  cholangiocarcinoma. 2022 EGD reported that the distal esophagus was moderately tortuous. He presented to the hospital on 02/05/2024 with generalized weakness and lower extremity edema. On 5/16 he started withdrawing form alcohol and started on phenobarb and required vasopressor support. He was foudn to have a RLL PNA. SLP swallow evaluation ordered on 02/08/24 due to RN noting patient to exhibit coughing when drinking liquids. Type of Study: Bedside Swallow Evaluation Previous Swallow Assessment: none found Diet Prior to this Study: Regular;Thin liquids (Level 0) Temperature Spikes Noted: No Respiratory Status: Room air History of Recent Intubation: No Behavior/Cognition: Alert;Requires cueing;Lethargic/Drowsy;Confused Oral Cavity Assessment: Dry Oral Care Completed by SLP: No Oral Cavity - Dentition: Missing dentition Self-Feeding Abilities: Total assist Patient Positioning: Upright in bed Baseline Vocal Quality: Low vocal intensity Volitional Cough: Cognitively unable to elicit Volitional Swallow: Unable to elicit    Oral/Motor/Sensory Function Overall Oral Motor/Sensory Function: Generalized oral weakness   Ice Chips     Thin Liquid Thin Liquid: Impaired Presentation: Straw Pharyngeal  Phase Impairments: Multiple swallows;Suspected delayed Swallow;Cough - Delayed    Nectar Thick Nectar Thick Liquid: Not tested   Honey Thick Honey Thick Liquid: Not tested   Puree Puree: Not tested   Solid     Solid: Not tested     Jacqualine Mater, MA, CCC-SLP Speech Therapy

## 2024-02-08 NOTE — Assessment & Plan Note (Addendum)
 Will hold on insulin  therapy to avoid hypoglycemia.  Fasting glucose 97 mg/dl today   Continue statin therapy

## 2024-02-08 NOTE — Assessment & Plan Note (Addendum)
 Aspiration pneumonia, present on admission.  Septic shock has resolved.  Wbc 4.1 and afebrile.   Plan to continue antibiotic therapy with ceftriaxone  to complete 5 days.  Aspiration precautions.   Swallow dysfunction, large flowing anterior osteophytes C2-C5 producing obstruction to his swallowing.  Will request cortrack to be placed for nutrition, and continue speech therapy.  Hopefully he can improve his swallow where he can have po intake, otherwise he may need PEG.

## 2024-02-08 NOTE — Assessment & Plan Note (Signed)
No signs of exacerbation  ?Continue with bronchodilator therapy.  ?

## 2024-02-08 NOTE — Assessment & Plan Note (Addendum)
 Echocardiogram with preserved LV systolic function EF 65 to 70%, RV systolic function preserved, no significant valvular disease.   Urine output is 225 ml Systolic blood pressure 100 mmHg range.   Currently loop diuretic on hold Continue blood pressure support with midodrine .  Questionable paroxysmal atrial fibrillation. Telemetry sinus rhythm, EKG sinus rhythm. Considering risk of bleeding and dark stools will hold on anticoagulation for now.

## 2024-02-08 NOTE — Hospital Course (Addendum)
 Mr. Ballengee was admitted to the hospital with the working diagnosis of septic shock due to pneumonia, complicated with heart failure.   73 yo male with the past medical history of heart failure, alcohol abuse, COPD, hypertensin, ascending aortic aneurysm, history od cancer (lung cancer sp resection and cholangiocarcinoma) who presented with generalized weakness and lower extremity edema.  On his initial physical examination his blood pressure was 80/59, HR 81, RR 20 and 02 saturation 98%. Lungs with no wheezing or rales, heart with S1 and S2 present irregularly irregular with no gallops or rubs, abdomen with no distention and positive lower extremity edema.   VBG 7,39/39/28/25/ 54% Na 123, K 3,4 CL 87 bicarbonate 24, glucose 89 bun 25 cr 1,50  AST 61 ALT 26 BNP 149  High sensitive troponin 56 and 55  Lactic acid 1,9  Wbc 6,9 hgb 13,6 plt 286   Urine analysis SG 1,005, negative protein, negative leukocytes and negative hgb   Chest radiograph with mild cardiomegaly, bilateral hilar vascular congestion with no infiltrates, positive right pleural effusion with possible right base atelectasis.   EKG 75 bpm, left axis deviation, qtc 490, right bundle branch block, sinus rhythm with second degree AV block type 1, positive PVC, no significant ST segment or T wave changes.   Patient had IV fluids resuscitation, antibiotics and resumed on midodrine .  5/16 started withdrawing from alcohol, started on phenobarb.  Continue to require vasopressor support   05/18 transfer to [facility].  05/19 clinically improving, continue to require mittens bilateral hands.  05/20 patient continue to have swallow dysfunction and not able to have enteral po nutrition. Ordered cortrack.

## 2024-02-09 ENCOUNTER — Inpatient Hospital Stay (HOSPITAL_COMMUNITY)

## 2024-02-09 DIAGNOSIS — J189 Pneumonia, unspecified organism: Secondary | ICD-10-CM | POA: Diagnosis not present

## 2024-02-09 DIAGNOSIS — F10939 Alcohol use, unspecified with withdrawal, unspecified: Secondary | ICD-10-CM | POA: Diagnosis not present

## 2024-02-09 DIAGNOSIS — I159 Secondary hypertension, unspecified: Secondary | ICD-10-CM | POA: Diagnosis not present

## 2024-02-09 DIAGNOSIS — I5033 Acute on chronic diastolic (congestive) heart failure: Secondary | ICD-10-CM | POA: Diagnosis not present

## 2024-02-09 LAB — CBC
HCT: 25.4 % — ABNORMAL LOW (ref 39.0–52.0)
Hemoglobin: 8.5 g/dL — ABNORMAL LOW (ref 13.0–17.0)
MCH: 35.1 pg — ABNORMAL HIGH (ref 26.0–34.0)
MCHC: 33.5 g/dL (ref 30.0–36.0)
MCV: 105 fL — ABNORMAL HIGH (ref 80.0–100.0)
Platelets: 190 10*3/uL (ref 150–400)
RBC: 2.42 MIL/uL — ABNORMAL LOW (ref 4.22–5.81)
RDW: 15.1 % (ref 11.5–15.5)
WBC: 4.1 10*3/uL (ref 4.0–10.5)
nRBC: 0 % (ref 0.0–0.2)

## 2024-02-09 LAB — GLUCOSE, CAPILLARY
Glucose-Capillary: 104 mg/dL — ABNORMAL HIGH (ref 70–99)
Glucose-Capillary: 140 mg/dL — ABNORMAL HIGH (ref 70–99)
Glucose-Capillary: 108 mg/dL — ABNORMAL HIGH (ref 70–99)
Glucose-Capillary: 106 mg/dL — ABNORMAL HIGH (ref 70–99)

## 2024-02-09 LAB — BASIC METABOLIC PANEL WITH GFR
Anion gap: 12 (ref 5–15)
BUN: 18 mg/dL (ref 8–23)
CO2: 19 mmol/L — ABNORMAL LOW (ref 22–32)
Calcium: 8.3 mg/dL — ABNORMAL LOW (ref 8.9–10.3)
Chloride: 102 mmol/L (ref 98–111)
Creatinine, Ser: 0.82 mg/dL (ref 0.61–1.24)
GFR, Estimated: >60 mL/min (ref >60)
Glucose, Bld: 99 mg/dL (ref 70–99)
Potassium: 4.3 mmol/L (ref 3.5–5.1)
Sodium: 133 mmol/L — ABNORMAL LOW (ref 135–145)

## 2024-02-09 MED ORDER — IPRATROPIUM-ALBUTEROL 0.5-2.5 (3) MG/3ML IN SOLN
3.0000 mL | RESPIRATORY_TRACT | Status: DC | PRN
  Administered 2024-02-11 – 2024-02-19 (×14): 3 mL via RESPIRATORY_TRACT
  Filled 2024-02-09 (×15): qty 3

## 2024-02-09 NOTE — Progress Notes (Signed)
 Progress Note   Patient: Kyle Montoya WUJ:[id number] DOB: 02/06/1951 DOA: 02/05/2024     4 DOS: the patient was seen and examined on 02/09/2024   Brief hospital course: Kyle Montoya was admitted to the hospital with the working diagnosis of septic shock due to pneumonia, complicated with heart failure.   73 yo male with the past medical history of heart failure, alcohol abuse, COPD, hypertensin, ascending aortic aneurysm, history od cancer (lung cancer sp resection and cholangiocarcinoma) who presented with generalized weakness and lower extremity edema.  On his initial physical examination his blood pressure was 80/59, HR 81, RR 20 and 02 saturation 98%. Lungs with no wheezing or rales, heart with S1 and S2 present irregularly irregular with no gallops or rubs, abdomen with no distention and positive lower extremity edema.   VBG 7,39/39/28/25/ 54% Na 123, K 3,4 CL 87 bicarbonate 24, glucose 89 bun 25 cr 1,50  AST 61 ALT 26 BNP 149  High sensitive troponin 56 and 55  Lactic acid 1,9  Wbc 6,9 hgb 13,6 plt 286   Urine analysis SG 1,005, negative protein, negative leukocytes and negative hgb   Chest radiograph with mild cardiomegaly, bilateral hilar vascular congestion with no infiltrates, positive right pleural effusion with possible right base atelectasis.   EKG 75 bpm, left axis deviation, qtc 490, right bundle branch block, sinus rhythm with second degree AV block type 1, positive PVC, no significant ST segment or T wave changes.   Patient had IV fluids resuscitation, antibiotics and resumed on midodrine .  5/16 started withdrawing from alcohol, started on phenobarb.  Continue to require vasopressor support   05/18 transfer to [facility].  05/19 clinically improving, continue to require mittens bilateral hands.   Assessment and Plan: * Right lower lobe pneumonia Septic shock has resolved.  Wbc 6,2 and afebrile.   Plan to continue antibiotic therapy with ceftriaxone  to complete 5 days.   Aspiration precautions.   Acute on chronic diastolic CHF (congestive heart failure) (HCC) Echocardiogram with preserved LV systolic function EF 65 to 70%, RV systolic function preserved, no significant valvular disease.   Urine output is 900 ml Systolic blood pressure 100 mmHg range.   Currently loop diuretic on hold Continue blood pressure support with midodrine .  Telemetry sinus rhythm, EKG sinus rhythm. Considering risk of bleeding and dark stools will hold on anticoagulation for now.   Alcohol withdrawal (HCC) Patient will continue phenobarbital  IV per protocol Patient continue NPO and aspiration precautions, for MBS per speech therapy.  Continue neuro checks Continue thiamine  and multivitamins   HTN (hypertension) Continue midodrine  for blood pressure control.    AKI (acute kidney injury) (HCC) Hyponatremia. hypokalemia  Renal function with serum cr at 0,82 with K a 4,3 and serum bicarbonate at 19  Na 133   Plan to continue close monitoring renal function and electrolytes.  Holding on loop diuretic for now.   CAD (coronary artery disease) No chest pain, no acute coronary syndrome   COPD with asthma (HCC) No signs of exacerbation  Continue with bronchodilator therapy   Type 2 diabetes mellitus with hyperlipidemia (HCC) Continue insulin  sliding scale for glucose cover and monitoring Fasting glucose 99 mg/dl today   Continue statin therapy   Ascending aortic aneurysm (HCC) Follow up as outpatient   Carcinoma of biliary tract (HCC) Follow up as outpatient.       Subjective: Patient is more calm and reactive, mittens in place   Physical Exam: Vitals:   02/08/24 2300 02/09/24 0300 02/09/24 0543 02/09/24  0700  BP: 117/68 (!) 109/91  116/86  Pulse: 93 90  96  Resp: 19 18  19   Temp: 97.8 F (36.6 C) 97.9 F (36.6 C)  97.8 F (36.6 C)  TempSrc: Oral Oral  Oral  SpO2: 98% 98%  97%  Weight:   86.2 kg   Height:       Neurology awake and alert, follows  simple commands and answers simple questions, continue to have mittens in place ENT with mild pallor Cardiovascular with S1 and S2 present and regular with no gallops, rubs or murmurs Respiratory with mild rales with no wheezing or rhonchi  Abdomen with no distention  No lower extremity edema   Data Reviewed:    Family Communication: no family at the bedside   Disposition: Status is: Inpatient Remains inpatient appropriate because: recovering withdrawals   Planned Discharge Destination: Home     Author: Albertus Alt, MD 02/09/2024 7:43 AM  For on call review [email or web address].

## 2024-02-09 NOTE — Plan of Care (Signed)
  Problem: Metabolic: Goal: Ability to maintain appropriate glucose levels will improve Outcome: Progressing   Problem: Tissue Perfusion: Goal: Adequacy of tissue perfusion will improve Outcome: Progressing   Problem: Clinical Measurements: Goal: Cardiovascular complication will be avoided Outcome: Progressing

## 2024-02-09 NOTE — TOC CM/SW Note (Signed)
 Transition of Care [facility]) - Inpatient Brief Assessment   Patient Details  Name: Kyle Montoya MRN: [id number] Date of Birth: [birth year]  Transition of Care [facility]) CM/SW Contact:    Juliane Och, LCSW Phone Number: 02/09/2024, 11:26 AM   Clinical Narrative:  11:26 AM Per chart review, patient resides at home alone. Patient has a PCP and insurance. Patient does not have SNF history. Patient has a rolling walker and shower chair at home. Patient has HH history with [facility]. TOC consulted was placed for substance use counseling/education; however, patient is not currently fully oriented. TOC will continue to follow once patient is fully oriented.  Transition of Care Asessment: Insurance and Status: Insurance coverage has been reviewed Patient has primary care physician: Yes Home environment has been reviewed: Private Residence Prior level of function:: N/A Prior/Current Home Services: No current home services (Has HH/DME History) Social Drivers of Health Review: SDOH reviewed no interventions necessary Readmission risk has been reviewed: Yes Transition of care needs: transition of care needs identified, TOC will continue to follow

## 2024-02-09 NOTE — Progress Notes (Addendum)
 Speech Language Pathology Treatment: Dysphagia  Patient Details Name: Kyle Montoya MRN: [id number] DOB: 1951/06/30 Today's Date: 02/09/2024 Time: 0950-1009 SLP Time Calculation (min) (ACUTE ONLY): 19 min  Assessment / Plan / Recommendation Clinical Impression  Kyle Montoya was alert, oriented to person/place but not situation.  Left mitt removed to encourage him to participate in PO trials. He self-fed water and nectar-thick liquids, both of which elicited intermittent cough response over multiple opportunities.  He tolerated applesauce without any difficulty.  He followed commands but had intermittent confusion.  Recommend proceeding today with MBS to assess physiology of swallow and determine whether he is aspirating. He is scheduled for 2 pm and may have ice chips and applesauce (meds crushed) pending study. D/W RN.  Left mitt placed back on his hand and bed alarm set prior to departure.    HPI HPI: Patient is a 73 y.o. male with PMH: ETOH abuse, COPD, HTN, heart failure, ascending aortic aneurysm, h/o lung cancer s/p resection and cholangiocarcinoma. 2022 EGD reported that the distal esophagus was moderately tortuous. He presented to the hospital on 02/05/2024 with generalized weakness and lower extremity edema. On 5/16 he started withdrawing form alcohol and started on phenobarb and required vasopressor support. He was foudn to have a RLL PNA. SLP swallow evaluation ordered on 02/08/24 due to RN noting patient to exhibit coughing when drinking liquids.      SLP Plan  MBS      Recommendations for follow up therapy are one component of a multi-disciplinary discharge planning process, led by the attending physician.  Recommendations may be updated based on patient status, additional functional criteria and insurance authorization.    Recommendations  Diet recommendations: Other(comment) (applesauce, ice chips pending MBS) Medication Administration: Crushed with puree Supervision: Patient  able to self feed                  Oral care BID     Dysphagia, unspecified (R13.10)     MBS    Jonn Chaikin L. Beatris Lincoln, MA CCC/SLP Clinical Specialist - Acute Care SLP Acute Rehabilitation Services Office number [phone]  [name]  02/09/2024, 10:09 AM

## 2024-02-09 NOTE — Evaluation (Addendum)
 Modified Barium Swallow Study  Patient Details  Name: Kyle Montoya MRN: [id number] Date of Birth: Jun 08, 1951  Today's Date: 02/09/2024  Modified Barium Swallow completed.  Full report located under Chart Review in the Imaging Section.  History of Present Illness Patient is a 73 y.o. male with PMH: ETOH abuse, COPD, HTN, heart failure, ascending aortic aneurysm, h/o lung cancer s/p resection and cholangiocarcinoma. 2022 EGD reported that the distal esophagus was moderately tortuous. He presented to the hospital on 02/05/2024 with generalized weakness and lower extremity edema. On 5/16 he started withdrawing form alcohol and started on phenobarb and required vasopressor support. He was found to have a RLL PNA. SLP swallow evaluation ordered on 02/08/24 due to RN noting patient to exhibit coughing when drinking liquids. Review of records shows Dec 2009 DG cervical spine:  Severe degenerative disc disease changes.  Fusion from C2-C5  anteriorly due to flowing osteophytes.   Clinical Impression Pt presents with flowing anterior osteophytes from C2-C5 (documentated in Dec 2009 - no further imaging of cervical vertebrae could be found subsequently).  These create a structural impediment to swallowing - obstructing epiglottic closure over the larynx, creating narrow pyriform sinuses (smaller "holding bay" for material), allowing residue to accumulate superior to epiglottis and within hypopharynx.  Materials spill into larynx/airway before, during, and after the swallow - thin liquids most likely to be aspirated. There is a reliable cough in response to aspiration, but it is weak and not sufficient to eject aspirate/penetrant from trachea and larynx.  Attempted to recline pt to redirect flow of barium into the esophagus and away from trachea, but this was not effective.   Kyle Montoya has lived with these large osteophytes for many years, but has decompensated and is unable to protect the airway and/or has more  of the comorbidities that make him susceptible to aspiration pna. It is certainly possible that with improved mental status/awareness his function will improve.  D/W Dr. Arrien - for now, continue NPO except meds crushed with purees and ice chips after oral care. SLP will follow.  DIGEST Swallow Severity Rating*  Safety: 2  Efficiency: 2  Overall Pharyngeal Swallow Severity: 2 - moderate  1: mild; 2: moderate; 3: severe; 4: profound  *The Dynamic Imaging Grade of Swallowing Toxicity is standardized for the head and neck cancer population, however, demonstrates promising clinical applications across populations to standardize the clinical rating of pharyngeal swallow safety and severity.     Factors that may increase risk of adverse event in presence of aspiration Kyle Montoya & Kyle Montoya 2021): Respiratory or GI disease;Weak cough;Aspiration of thick, dense, and/or acidic materials  Swallow Evaluation Recommendations Recommendations: NPO;NPO except meds Medication Administration: Crushed with puree Oral care recommendations: Oral care QID (4x/day);Oral care before ice chips/water     Kyle Montoya L. Beatris Lincoln, MA CCC/SLP Clinical Specialist - Acute Care SLP Acute Rehabilitation Services Office number [phone]  Kyle Montoya Kyle Montoya 02/09/2024,5:43 PM

## 2024-02-09 NOTE — Progress Notes (Signed)
 Patient daughter, Kyle Montoya would like a call to discuss plan of care.  Cell phone number [phone].

## 2024-02-10 ENCOUNTER — Inpatient Hospital Stay (HOSPITAL_COMMUNITY)

## 2024-02-10 DIAGNOSIS — J189 Pneumonia, unspecified organism: Secondary | ICD-10-CM | POA: Diagnosis not present

## 2024-02-10 DIAGNOSIS — R0609 Other forms of dyspnea: Secondary | ICD-10-CM | POA: Diagnosis not present

## 2024-02-10 DIAGNOSIS — I159 Secondary hypertension, unspecified: Secondary | ICD-10-CM | POA: Diagnosis not present

## 2024-02-10 DIAGNOSIS — I5033 Acute on chronic diastolic (congestive) heart failure: Secondary | ICD-10-CM | POA: Diagnosis not present

## 2024-02-10 DIAGNOSIS — F10939 Alcohol use, unspecified with withdrawal, unspecified: Secondary | ICD-10-CM | POA: Diagnosis not present

## 2024-02-10 LAB — CULTURE, BLOOD (ROUTINE X 2): Culture: NO GROWTH

## 2024-02-10 LAB — BASIC METABOLIC PANEL WITH GFR
Anion gap: 11 (ref 5–15)
BUN: 13 mg/dL (ref 8–23)
CO2: 21 mmol/L — ABNORMAL LOW (ref 22–32)
Calcium: 8.3 mg/dL — ABNORMAL LOW (ref 8.9–10.3)
Chloride: 102 mmol/L (ref 98–111)
Creatinine, Ser: 0.8 mg/dL (ref 0.61–1.24)
GFR, Estimated: >60 mL/min (ref >60)
Glucose, Bld: 97 mg/dL (ref 70–99)
Potassium: 4.1 mmol/L (ref 3.5–5.1)
Sodium: 134 mmol/L — ABNORMAL LOW (ref 135–145)

## 2024-02-10 LAB — ECHOCARDIOGRAM LIMITED
AR max vel: 3.4 cm2
AV Peak grad: 7.5 mmHg
Ao pk vel: 1.37 m/s
Area-P 1/2: 4.6 cm2
Height: 72 in
S' Lateral: 2.9 cm
Weight: 3040.58 [oz_av]

## 2024-02-10 LAB — GLUCOSE, CAPILLARY
Glucose-Capillary: 109 mg/dL — ABNORMAL HIGH (ref 70–99)
Glucose-Capillary: 96 mg/dL (ref 70–99)
Glucose-Capillary: 75 mg/dL (ref 70–99)
Glucose-Capillary: 83 mg/dL (ref 70–99)

## 2024-02-10 LAB — MAGNESIUM: Magnesium: 1.7 mg/dL (ref 1.7–2.4)

## 2024-02-10 MED ORDER — HYDROCODONE-ACETAMINOPHEN 5-325 MG PO TABS
1.0000 | ORAL_TABLET | Freq: Once | ORAL | Status: AC | PRN
  Administered 2024-02-10: 1 via ORAL
  Filled 2024-02-10: qty 1

## 2024-02-10 MED ORDER — LORAZEPAM 1 MG PO TABS: 1.0000 mg | ORAL_TABLET | ORAL | Status: DC | PRN

## 2024-02-10 MED ORDER — MAGNESIUM SULFATE 2 GM/50ML IV SOLN
2.0000 g | Freq: Once | INTRAVENOUS | Status: AC
  Administered 2024-02-10: 2 g via INTRAVENOUS
  Filled 2024-02-10: qty 50

## 2024-02-10 NOTE — Progress Notes (Signed)
 SLP Cancellation Note  Patient Details Name: Kyle Montoya MRN: [id number] DOB: 04/09/1951   Cancelled treatment:       Reason Eval/Treat Not Completed: Patient's level of consciousness SLP will continue to follow.  Jacqualine Mater, MA, CCC-SLP Speech Therapy

## 2024-02-10 NOTE — Plan of Care (Signed)
 Mr Brickle had multiple sinus pauses this morning with HR 47-50s. 12 lead completed and it showed 1st degree AVB initially. Dr Sunnie England notified and ECHO ordered.  Later while looking through the telemetry review it appeared that he may have been in a 2nd degree type I AVB. Dr Sunnie England notified again.  Strip printed for 5/20 at 1203 and place in patient chart.  Problem: Education: Goal: Ability to describe self-care measures that may prevent or decrease complications (Diabetes Survival Skills Education) will improve Outcome: Not Progressing Goal: Individualized Educational Video(s) Outcome: Not Progressing   Problem: Coping: Goal: Ability to adjust to condition or change in health will improve Outcome: Not Progressing   Problem: Fluid Volume: Goal: Ability to maintain a balanced intake and output will improve Outcome: Not Progressing   Problem: Health Behavior/Discharge Planning: Goal: Ability to identify and utilize available resources and services will improve Outcome: Not Progressing Goal: Ability to manage health-related needs will improve Outcome: Not Progressing   Problem: Metabolic: Goal: Ability to maintain appropriate glucose levels will improve Outcome: Not Progressing   Problem: Nutritional: Goal: Maintenance of adequate nutrition will improve Outcome: Not Progressing Goal: Progress toward achieving an optimal weight will improve Outcome: Not Progressing   Problem: Skin Integrity: Goal: Risk for impaired skin integrity will decrease Outcome: Not Progressing   Problem: Tissue Perfusion: Goal: Adequacy of tissue perfusion will improve Outcome: Not Progressing   Problem: Education: Goal: Knowledge of General Education information will improve Description: Including pain rating scale, medication(s)/side effects and non-pharmacologic comfort measures Outcome: Not Progressing   Problem: Health Behavior/Discharge Planning: Goal: Ability to manage health-related needs  will improve Outcome: Not Progressing   Problem: Clinical Measurements: Goal: Ability to maintain clinical measurements within normal limits will improve Outcome: Not Progressing Goal: Will remain free from infection Outcome: Not Progressing Goal: Diagnostic test results will improve Outcome: Not Progressing Goal: Respiratory complications will improve Outcome: Not Progressing Goal: Cardiovascular complication will be avoided Outcome: Not Progressing   Problem: Activity: Goal: Risk for activity intolerance will decrease Outcome: Not Progressing   Problem: Nutrition: Goal: Adequate nutrition will be maintained Outcome: Not Progressing   Problem: Coping: Goal: Level of anxiety will decrease Outcome: Not Progressing   Problem: Elimination: Goal: Will not experience complications related to bowel motility Outcome: Not Progressing Goal: Will not experience complications related to urinary retention Outcome: Not Progressing   Problem: Pain Managment: Goal: General experience of comfort will improve and/or be controlled Outcome: Not Progressing   Problem: Safety: Goal: Ability to remain free from injury will improve Outcome: Not Progressing   Problem: Skin Integrity: Goal: Risk for impaired skin integrity will decrease Outcome: Not Progressing

## 2024-02-10 NOTE — TOC CM/SW Note (Signed)
 Transition of Care [facility]) - Inpatient Brief Assessment   Patient Details  Name: Kyle Montoya MRN: [id number] Date of Birth: 08-28-1951  Transition of Care [facility]) CM/SW Contact:    Juliane Och, LCSW Phone Number: 02/10/2024, 12:03 PM   Clinical Narrative:  12:03 PM Per chart review, patient is not yet fully oriented. TOC will continue to follow.  Transition of Care Asessment: Insurance and Status: Insurance coverage has been reviewed Patient has primary care physician: Yes Home environment has been reviewed: Private Residence Prior level of function:: N/A Prior/Current Home Services: No current home services (Has HH/DME History) Social Drivers of Health Review: SDOH reviewed no interventions necessary Readmission risk has been reviewed: Yes Transition of care needs: transition of care needs identified, TOC will continue to follow

## 2024-02-10 NOTE — Progress Notes (Signed)
 Patient scores 11 on CIWA. He is very anxious, restless, agitated, confused with clouding. No prn ativan  on MAR. Patient on Phenobarbital  protcol.

## 2024-02-10 NOTE — Progress Notes (Signed)
 Echocardiogram 2D Echocardiogram has been performed.  Kyle Montoya 02/10/2024, 5:21 PM

## 2024-02-10 NOTE — Plan of Care (Signed)
  Problem: Coping: Goal: Ability to adjust to condition or change in health will improve Outcome: Progressing   Problem: Skin Integrity: Goal: Risk for impaired skin integrity will decrease Outcome: Progressing   Problem: Clinical Measurements: Goal: Ability to maintain clinical measurements within normal limits will improve Outcome: Progressing   Problem: Coping: Goal: Level of anxiety will decrease Outcome: Progressing

## 2024-02-10 NOTE — Progress Notes (Addendum)
 Initial Nutrition Assessment  DOCUMENTATION CODES:   Non-severe (moderate) malnutrition in context of social or environmental circumstances  INTERVENTION:  When appropriate, initiate tube feeding via Cortrak: Osmolite 1.5 at 55 ml/h (1320 ml per day) Initiate at 15ml/hr and increase by 10ml/hr q8 hours until goal rate achieved Prosource TF20 60 ml daily  Provides 2060 kcal, 103 gm protein, 1006 ml free water daily   Continue thiamine /folic acid  r/t EtOH use Monitor magnesium , potassium, and phosphorus BID for at least 3 days, MD to replete as needed Add MVI w/ minerals  NUTRITION DIAGNOSIS:  Moderate Malnutrition related to social / environmental circumstances (EtOH abuse and loss of his wife) as evidenced by percent weight loss, mild fat depletion, moderate muscle depletion.  GOAL:  Patient will meet greater than or equal to 90% of their needs  MONITOR:  TF tolerance, Diet advancement  REASON FOR ASSESSMENT:   Consult Enteral/tube feeding initiation and management  ASSESSMENT:   Pt with PMH significant for heart failure, EtOH abuse, COPD, HTN, AAA, hx lung CA s/p resection and cholangiocarcinoma. Presented with generalized weakness and lower extremity edema and found to have RLL PNA.  Pt not fully oriented today, but improving in that regard as well as clinically. W/d symptoms improving. Speech unable to assess d/t lethargy and Cortrak placement ordered to start enteral nutrition. Will be placed tomorrow and tube feeds initiated. Will leave recommendations above. MBS to be completed at some point, when patient alert enough to complete.  Average Meal Intake 5/16: 50% x1 documented meal  Pt lethargic and difficult to rouse at bedside today. Called his daughter, Maida Sciara, to obtain nutrition-related history.She states he has had poor intake for some time (2-4 months) due to inability to swallow. He is an alcoholic, however drinking picked up significantly in last year after the  passing of his wife. He is independent with ADLs and lives alone, at baseline. She states he can go 3-4 days without eating sometimes and tends to eat foods that are easy to swallow (i.e. mashed potatoes, soups, eggs). Does consume 2 Ensures a day, per her report. He is at high risk for refeeding. Recommend monitoring electrolytes and repletion by MD, as indicated.  Admit Weight: 91.8kg Current Weight: 86.2kg  Daughter endorses a 30lbs weight loss in last few months, but cannot report UBW. Per chart review, patient has lost 10.4% of his body weight in last six months, which is considered clinically significant for the time frame reviewed. Of note, he has been diuresed this admission and is with edema to BLEs on exam. Loop diuretic on hold. Edema be masking additional muscle and/or fat wasting.    Intake/Output Summary (Last 24 hours) at 02/10/2024 1557 Last data filed at 02/10/2024 1336 Gross per 24 hour  Intake 481.53 ml  Output 325 ml  Net 156.53 ml    Net IO Since Admission: -6,511.38 mL [02/10/24 1557]   Drains/Lines: Foley catheter UOP: x24 hours  Sodium trending up. Has required magnesium  and potassium supplementation.  Meds: folic acid , SSI 0-9 TID, MVI, pantoprazole , thiamine  Drips: IV ABX 2g Mg sulfate x1  Labs:  Na+ 128>129>133>134 (L) K+ 4.1 (wdl) Mg 1.7 (wdl) CBGs 97-99 x24 hours O5D 4.6 (01/2024)  NUTRITION - FOCUSED PHYSICAL EXAM:  Flowsheet Row Most Recent Value  Orbital Region No depletion  Upper Arm Region Mild depletion  Thoracic and Lumbar Region Mild depletion  Buccal Region Mild depletion  Temple Region Mild depletion  Clavicle Bone Region Moderate depletion  Clavicle and Acromion  Bone Region Moderate depletion  Scapular Bone Region Mild depletion  Dorsal Hand No depletion  Patellar Region No depletion  Anterior Thigh Region Moderate depletion  Posterior Calf Region Mild depletion  Edema (RD Assessment) Mild  Hair Reviewed  Eyes Reviewed   Mouth Reviewed  Skin Reviewed  Nails Reviewed     Diet Order:   Diet Order             Diet NPO time specified Except for: Sips with Meds  Diet effective now            EDUCATION NEEDS:  Not appropriate for education at this time  Skin:  Skin Assessment: Reviewed RN Assessment  Last BM:  5/18 - type 7 x2  Height:  Ht Readings from Last 1 Encounters:  02/06/24 6' (1.829 m)   Weight:  Wt Readings from Last 1 Encounters:  02/09/24 86.2 kg   Ideal Body Weight:  80.9 kg  BMI:  Body mass index is 25.77 kg/m.  Estimated Nutritional Needs:   Kcal:  1900-2100kcals  Protein:  90-105g  Fluid:  >1.9L/day  Con Decant MS, RD, LDN Registered Dietitian Clinical Nutrition RD Inpatient Contact Info in Amion

## 2024-02-10 NOTE — Progress Notes (Signed)
 Progress Note   Patient: Kyle Montoya NGE:[id number] DOB: [birth year] DOA: 02/05/2024     5 DOS: the patient was seen and examined on 02/10/2024   Brief hospital course: Kyle Montoya was admitted to the hospital with the working diagnosis of septic shock due to pneumonia, complicated with heart failure.   73 yo male with the past medical history of heart failure, alcohol abuse, COPD, hypertensin, ascending aortic aneurysm, history od cancer (lung cancer sp resection and cholangiocarcinoma) who presented with generalized weakness and lower extremity edema.  On his initial physical examination his blood pressure was 80/59, HR 81, RR 20 and 02 saturation 98%. Lungs with no wheezing or rales, heart with S1 and S2 present irregularly irregular with no gallops or rubs, abdomen with no distention and positive lower extremity edema.   VBG 7,39/39/28/25/ 54% Na 123, K 3,4 CL 87 bicarbonate 24, glucose 89 bun 25 cr 1,50  AST 61 ALT 26 BNP 149  High sensitive troponin 56 and 55  Lactic acid 1,9  Wbc 6,9 hgb 13,6 plt 286   Urine analysis SG 1,005, negative protein, negative leukocytes and negative hgb   Chest radiograph with mild cardiomegaly, bilateral hilar vascular congestion with no infiltrates, positive right pleural effusion with possible right base atelectasis.   EKG 75 bpm, left axis deviation, qtc 490, right bundle branch block, sinus rhythm with second degree AV block type 1, positive PVC, no significant ST segment or T wave changes.   Patient had IV fluids resuscitation, antibiotics and resumed on midodrine .  5/16 started withdrawing from alcohol, started on phenobarb.  Continue to require vasopressor support   05/18 transfer to [facility].  05/19 clinically improving, continue to require mittens bilateral hands.  05/20 patient continue to have swallow dysfunction and not able to have enteral po nutrition. Ordered cortrack.   Assessment and Plan: * Right lower lobe pneumonia Septic shock has  resolved.  Wbc 6,2 and afebrile.   Plan to continue antibiotic therapy with ceftriaxone  to complete 5 days.  Aspiration precautions.   Acute on chronic diastolic CHF (congestive heart failure) (HCC) Echocardiogram with preserved LV systolic function EF 65 to 70%, RV systolic function preserved, no significant valvular disease.   Urine output is 900 ml Systolic blood pressure 100 mmHg range.   Currently loop diuretic on hold Continue blood pressure support with midodrine .  Telemetry sinus rhythm, EKG sinus rhythm. Considering risk of bleeding and dark stools will hold on anticoagulation for now.   Alcohol withdrawal (HCC) Patient will continue phenobarbital  IV per protocol Patient continue NPO and aspiration precautions, for MBS per speech therapy.  Continue neuro checks Continue thiamine  and multivitamins   HTN (hypertension) Continue midodrine  for blood pressure control.    AKI (acute kidney injury) (HCC) Hyponatremia. hypokalemia  Renal function with serum cr at 0,82 with K a 4,3 and serum bicarbonate at 19  Na 133   Plan to continue close monitoring renal function and electrolytes.  Holding on loop diuretic for now.   CAD (coronary artery disease) No chest pain, no acute coronary syndrome   COPD with asthma (HCC) No signs of exacerbation  Continue with bronchodilator therapy   Type 2 diabetes mellitus with hyperlipidemia (HCC) Continue insulin  sliding scale for glucose cover and monitoring Fasting glucose 99 mg/dl today   Continue statin therapy   Ascending aortic aneurysm (HCC) Follow up as outpatient   Carcinoma of biliary tract (HCC) Follow up as outpatient.       Subjective: patient somnolent but easy to  arouse, following simple commands and answering simple questions.   Physical Exam: Vitals:   02/10/24 0340 02/10/24 0550 02/10/24 0739 02/10/24 0758  BP: 122/71  114/80 99/75  Pulse: 90  78 68  Resp: 15 18 18 18   Temp:    98.9 F (37.2 C)   TempSrc:    Oral  SpO2: 100%  100% 100%  Weight:      Height:       Neurology somnolent but easy to arouse, following simple commands ENT with mild pallor no icterus Cardiovascular with S1 and S2 present and regular with no gallops, no murmurs, no rubs Respiratory with no rales or wheezing no rhonchi, poor inspiratory effort Abdomen with no distention  No lower extremity edema  Data Reviewed:   Family Communication: no family at the bedside. I spoke with patient's wife at the bedside, we talked in detail about patient's condition, plan of care and prognosis and all questions were addressed.  Disposition: Status is: Inpatient Remains inpatient appropriate because: recovering withdrawals.  Planned Discharge Destination: Skilled nursing facility    Author: Albertus Alt, MD 02/10/2024 11:14 AM  For on call review [email or web address].

## 2024-02-10 NOTE — Progress Notes (Signed)
 [facility] night cross cover note:   I was notified by the patient's RN that the patient is complaining of his chronic neck discomfort in the setting of a history of arthritis of the neck, pain medication to attend to this.  Per brief chart review, he is on prn Norco for his chronic discomfort as an outpatient.  Most recent vital signs notable for the following: Afebrile; heart rates in the 80s; systolic blood pressures in the 120s; respiratory rate 15-17, and oxygen saturation 100% on room air.  I subsequently placed order for a one-time dose of his home prn Norco.  Specifically, I ordered Norco 5/325 mg p.o. x 1 dose as needed for pain.     Camelia Cavalier, DO Hospitalist

## 2024-02-10 NOTE — Progress Notes (Signed)
 Patient sleeping on and off. When he is awake, he is confused and oriented only to self. He has undressed himself twice, pulled off telemetry, and attempted to pull his Foley out. Soft mittens applied to patient.

## 2024-02-11 ENCOUNTER — Inpatient Hospital Stay (HOSPITAL_COMMUNITY)

## 2024-02-11 DIAGNOSIS — E440 Moderate protein-calorie malnutrition: Secondary | ICD-10-CM | POA: Insufficient documentation

## 2024-02-11 DIAGNOSIS — J189 Pneumonia, unspecified organism: Secondary | ICD-10-CM | POA: Diagnosis not present

## 2024-02-11 HISTORY — DX: Moderate protein-calorie malnutrition: E44.0

## 2024-02-11 LAB — IRON AND TIBC
Iron: 32 ug/dL — ABNORMAL LOW (ref 45–182)
Saturation Ratios: 13 % — ABNORMAL LOW (ref 17.9–39.5)
TIBC: 248 ug/dL — ABNORMAL LOW (ref 250–450)
UIBC: 216 ug/dL

## 2024-02-11 LAB — RETICULOCYTES
Immature Retic Fract: 34.6 % — ABNORMAL HIGH (ref 2.3–15.9)
RBC.: 2.17 MIL/uL — ABNORMAL LOW (ref 4.22–5.81)
Retic Count, Absolute: 62.3 10*3/uL (ref 19.0–186.0)
Retic Ct Pct: 2.9 % (ref 0.4–3.1)

## 2024-02-11 LAB — BASIC METABOLIC PANEL WITH GFR
Anion gap: 9 (ref 5–15)
BUN: 9 mg/dL (ref 8–23)
CO2: 23 mmol/L (ref 22–32)
Calcium: 8.1 mg/dL — ABNORMAL LOW (ref 8.9–10.3)
Chloride: 105 mmol/L (ref 98–111)
Creatinine, Ser: 0.68 mg/dL (ref 0.61–1.24)
GFR, Estimated: >60 mL/min (ref >60)
Glucose, Bld: 76 mg/dL (ref 70–99)
Potassium: 3.6 mmol/L (ref 3.5–5.1)
Sodium: 137 mmol/L (ref 135–145)

## 2024-02-11 LAB — GLUCOSE, CAPILLARY
Glucose-Capillary: 77 mg/dL (ref 70–99)
Glucose-Capillary: 93 mg/dL (ref 70–99)

## 2024-02-11 LAB — CBC
HCT: 22.1 % — ABNORMAL LOW (ref 39.0–52.0)
Hemoglobin: 7.5 g/dL — ABNORMAL LOW (ref 13.0–17.0)
MCH: 35.2 pg — ABNORMAL HIGH (ref 26.0–34.0)
MCHC: 33.9 g/dL (ref 30.0–36.0)
MCV: 103.8 fL — ABNORMAL HIGH (ref 80.0–100.0)
Platelets: 208 10*3/uL (ref 150–400)
RBC: 2.13 MIL/uL — ABNORMAL LOW (ref 4.22–5.81)
RDW: 15.6 % — ABNORMAL HIGH (ref 11.5–15.5)
WBC: 3.2 10*3/uL — ABNORMAL LOW (ref 4.0–10.5)
nRBC: 0.6 % — ABNORMAL HIGH (ref 0.0–0.2)

## 2024-02-11 LAB — CULTURE, BLOOD (ROUTINE X 2)
Culture: NO GROWTH
Special Requests: ADEQUATE

## 2024-02-11 LAB — AMMONIA: Ammonia: 19 umol/L (ref 9–35)

## 2024-02-11 LAB — VITAMIN B12: Vitamin B-12: 889 pg/mL (ref 180–914)

## 2024-02-11 LAB — FERRITIN: Ferritin: 284 ng/mL (ref 24–336)

## 2024-02-11 LAB — FOLATE: Folate: 8.5 ng/mL (ref >5.9)

## 2024-02-11 MED ORDER — TAMSULOSIN HCL 0.4 MG PO CAPS
0.4000 mg | ORAL_CAPSULE | Freq: Every day | ORAL | Status: DC
  Administered 2024-02-11: 0.4 mg via ORAL
  Filled 2024-02-11: qty 1

## 2024-02-11 MED ORDER — PROSOURCE TF20 ENFIT COMPATIBL EN LIQD
60.0000 mL | Freq: Every day | ENTERAL | Status: DC
  Administered 2024-02-11 – 2024-02-19 (×9): 60 mL
  Filled 2024-02-11 (×9): qty 60

## 2024-02-11 MED ORDER — FOLIC ACID 1 MG PO TABS
1.0000 mg | ORAL_TABLET | Freq: Every day | ORAL | Status: DC
  Administered 2024-02-12 – 2024-02-19 (×8): 1 mg
  Filled 2024-02-11 (×8): qty 1

## 2024-02-11 MED ORDER — IRON SUCROSE 200 MG IVPB - SIMPLE MED
200.0000 mg | Status: AC
  Administered 2024-02-11 – 2024-02-13 (×3): 200 mg via INTRAVENOUS
  Filled 2024-02-11: qty 200
  Filled 2024-02-11: qty 10
  Filled 2024-02-11: qty 200

## 2024-02-11 MED ORDER — POTASSIUM CHLORIDE 20 MEQ PO PACK
40.0000 meq | PACK | Freq: Once | ORAL | Status: AC
  Administered 2024-02-11: 40 meq via ORAL
  Filled 2024-02-11: qty 2

## 2024-02-11 MED ORDER — LIDOCAINE HCL URETHRAL/MUCOSAL 2 % EX GEL
1.0000 | Freq: Once | CUTANEOUS | Status: AC
  Administered 2024-02-11: 1 via URETHRAL
  Filled 2024-02-11: qty 6

## 2024-02-11 MED ORDER — OSMOLITE 1.2 CAL PO LIQD: 1000.0000 mL | ORAL | Status: DC

## 2024-02-11 MED ORDER — MIDODRINE HCL 5 MG PO TABS
10.0000 mg | ORAL_TABLET | Freq: Three times a day (TID) | ORAL | Status: DC
  Administered 2024-02-12 – 2024-02-13 (×6): 10 mg
  Filled 2024-02-11 (×6): qty 2

## 2024-02-11 MED ORDER — THIAMINE MONONITRATE 100 MG PO TABS
100.0000 mg | ORAL_TABLET | ORAL | Status: DC
  Administered 2024-02-12 – 2024-02-19 (×8): 100 mg
  Filled 2024-02-11 (×8): qty 1

## 2024-02-11 MED ORDER — ADULT MULTIVITAMIN W/MINERALS CH
1.0000 | ORAL_TABLET | Freq: Every day | ORAL | Status: DC
  Administered 2024-02-12 – 2024-02-19 (×8): 1
  Filled 2024-02-11 (×8): qty 1

## 2024-02-11 MED ORDER — OSMOLITE 1.5 CAL PO LIQD
1000.0000 mL | ORAL | Status: DC
  Administered 2024-02-11 – 2024-02-19 (×4): 1000 mL
  Filled 2024-02-11 (×3): qty 1000

## 2024-02-11 MED ORDER — THIAMINE HCL 100 MG/ML IJ SOLN
200.0000 mg | INTRAVENOUS | Status: DC
  Filled 2024-02-11: qty 2

## 2024-02-11 MED ORDER — FAMOTIDINE 20 MG PO TABS
40.0000 mg | ORAL_TABLET | Freq: Two times a day (BID) | ORAL | Status: DC
  Administered 2024-02-11 – 2024-02-19 (×16): 40 mg
  Filled 2024-02-11 (×16): qty 2

## 2024-02-11 NOTE — Progress Notes (Signed)
 Speech Language Pathology Treatment: Dysphagia  Patient Details Name: Kyle Montoya MRN: [id number] DOB: [birth year] Today's Date: 02/11/2024 Time: 7829-5621 SLP Time Calculation (min) (ACUTE ONLY): 24 min  Assessment / Plan / Recommendation Clinical Impression  Kyle Montoya mental status is improved today. Unfortunately, he shows no meaningful improvement in swallowing based on clinical presentation. He continues to cough with consumption of water and ice chips, likely indicative of aspiration based on 5/19 MBS. Spoke with his dtr, Kyle Montoya, on phone and explained results of study. She confirms he has had more difficulty swallowing for months, coughing with POs, and has had 30 lb weight loss (exacerbated by other stressors, including death of his wife).   Doubt we will see rapid improvement in swallowing. His may be a case for which longer term enteral support would be beneficial.  He may show some improvement with time and baseline function could be restored, particularly since he has lived with this condition since at least 2009 (when first dx'd in imaging).  Discussed situation with Dr. Drexel Gentles and medical team. I don't know if he is a candidate for surgical intervention.   Recommend continuing sips of water, ice chips, and meds crushed via applesauce.    SLP will follow.     HPI HPI: Patient is a 73 y.o. male admitted with RLL pna, sepsis, heart failure, alcohol withdrawal.  PMH: ETOH abuse, COPD, HTN, heart failure, ascending aortic aneurysm, h/o lung cancer s/p resection and cholangiocarcinoma. Aaron Aas SLP swallow evaluation ordered on 02/08/24 due to RN noting patient to exhibit coughing when drinking liquids. Review of records shows Dec 2009 DG cervical spine:  Severe degenerative disc disease changes.  Fusion from C2-C5  anteriorly due to flowing osteophytes. MBS 5/19 moderate pharyngeal dysphagia due to obstruction by osteophytes.      SLP Plan  Continue with current plan of care       Recommendations for follow up therapy are one component of a multi-disciplinary discharge planning process, led by the attending physician.  Recommendations may be updated based on patient status, additional functional criteria and insurance authorization.    Recommendations  Diet recommendations: Other(comment);NPO (sips and chips) Liquids provided via: Cup;Straw Medication Administration: Crushed with puree Supervision: Patient able to self feed Compensations: Small sips/bites                  Oral care BID   Intermittent Supervision/Assistance Dysphagia, pharyngeal phase (R13.13)     Continue with current plan of care    Joyice Magda L. Beatris Lincoln, MA CCC/SLP Clinical Specialist - Acute Care SLP Acute Rehabilitation Services Office number [phone]  [name]  02/11/2024, 11:45 AM

## 2024-02-11 NOTE — Progress Notes (Addendum)
 PROGRESS NOTE    Kyle Montoya  ZOX:[id number] DOB: September 21, 1951 DOA: 02/05/2024 PCP: Teofilo Fellers, NP  73/M w diastolic CHF, COPD, EtOH abuse, history of multiple cancers namely lung cancer sp resection and cholangiocarcinoma presented to the ED with generalized weakness and lower extremity edema. In the ER he was hypotensive with lower extremity edema, labs noted hyponatremia, creatinine 1.5, mildly elevated LFTs, BNP 149, troponin 56, lactic acid 1.9, chest x-ray with pulmonary vascular congestion, pleural effusion -Fluid resuscitated, also treated with antibiotics and midodrine   - 5/16 developed EtOH withdrawal, started on phenobarbital , continued vasopressor support  - 5/17 weaned off vasopressors  -5/18 transfer to [facility].  -5/19 clinically improving, delirium, required mittens -5/20 noted to have dysphagia and not able to have enteral po nutrition. Ordered cortrack.    Subjective: -feels fair, cortrak pending, wants food and water  Assessment and Plan:  Septic shock, resolved Right lower lobe pneumonia -Completed antibiotic course - Dysphagia, see below  Dysphagia Swallow dysfunction, - Noted to have depressed mentation as well as large flowing anterior osteophytes C2-C5 producing obstruction to his swallowing.  -Now off phenobarbital , more awake and alert, request SLP reeval before proceeding with cortrak  Acute on chronic diastolic CHF (congestive heart failure) (HCC) Echocardiogram with preserved LV systolic function EF 65 to 70%, RV systolic function preserved, no significant valvular disease.  -Continue blood pressure support with midodrine . - Diuretics on hold now  Iron deficiency anemia - Will add IV iron therapy, trend hemoglobin, - Is not in clinical condition that is appropriate for further evaluation at this time, recommend GI follow-up  Alcohol withdrawal (HCC) -Now with improvement, now off phenobarbital  however he Continue neuro checks Continue thiamine   and multivitamins   HTN (hypertension) Continue midodrine  for blood pressure control.    AKI (acute kidney injury) (HCC) Hyponatremia. hypokalemia - Resolved  CAD (coronary artery disease) No chest pain, no acute coronary syndrome   COPD with asthma (HCC) Stable Continue with bronchodilator therapy   Type 2 diabetes mellitus with hyperlipidemia (HCC) Will hold on insulin  therapy to avoid hypoglycemia.  Fasting glucose 97 mg/dl today   Ascending aortic aneurysm (HCC) Follow up as outpatient   Carcinoma of biliary tract (HCC) Follow up as outpatient.     DVT prophylaxis: SCDs Code Status: Full Code Family Communication: none present Disposition Plan: TBD  Consultants:    Procedures:   Antimicrobials:    Objective: Vitals:   02/11/24 0100 02/11/24 0400 02/11/24 0433 02/11/24 0742  BP: 121/78 139/78  107/70  Pulse: 89 94 86 85  Resp: 13 18 17 17   Temp:  97.8 F (36.6 C)  98 F (36.7 C)  TempSrc:  Oral  Axillary  SpO2: 100% 94% 96% 95%  Weight:  86.6 kg    Height:        Intake/Output Summary (Last 24 hours) at 02/11/2024 1052 Last data filed at 02/11/2024 0744 Gross per 24 hour  Intake 150.07 ml  Output 100 ml  Net 50.07 ml   Filed Weights   02/08/24 0609 02/09/24 0543 02/11/24 0400  Weight: 89.2 kg 86.2 kg 86.6 kg    Examination:  General exam: Chronically ill elderly male laying in bed, AO x 2, cognitive deficits noted Respiratory system: Decreased breath sounds at the bases Cardiovascular system: S1 & S2 heard, RRR.  Abd: nondistended, soft and nontender.Normal bowel sounds heard. Extremities: no edema Skin: No rashes Psychiatry: Flat affect    Data Reviewed:   CBC: Recent Labs  Lab 02/05/24 1524 02/05/24  1554 02/06/24 0344 02/07/24 0340 02/07/24 2148 02/08/24 0233 02/08/24 1212 02/09/24 0230 02/11/24 0249  WBC 6.9  --  7.7 6.5  --  6.2  --  4.1 3.2*  NEUTROABS 5.0  --   --   --   --   --   --   --   --   HGB 13.6   < >  12.7* 11.1* 12.0* 10.4* 9.0* 8.5* 7.5*  HCT 38.0*   < > 35.2* 31.0* 33.9* 28.5* 25.6* 25.4* 22.1*  MCV 98.4  --  97.8 100.3*  --  99.3  --  105.0* 103.8*  PLT 286  --  312 226  --  256  --  190 208   < > = values in this interval not displayed.   Basic Metabolic Panel: Recent Labs  Lab 02/05/24 1911 02/06/24 0344 02/06/24 1741 02/07/24 0740 02/08/24 0233 02/09/24 0230 02/10/24 0315 02/11/24 0249  NA  --  125*   < > 128* 129* 133* 134* 137  K  --  2.9*   < > 4.0 4.5 4.3 4.1 3.6  CL  --  90*   < > 94* 96* 102 102 105  CO2  --  23   < > 23 23 19* 21* 23  GLUCOSE  --  105*   < > 93 127* 99 97 76  BUN  --  24*   < > 13 19 18 13 9   CREATININE  --  1.20   < > 0.64 0.81 0.82 0.80 0.68  CALCIUM  --  7.3*   < > 7.7* 8.1* 8.3* 8.3* 8.1*  MG 1.9 1.8  --  1.7  --   --  1.7  --   PHOS  --  4.3  --  2.3*  --   --   --   --    < > = values in this interval not displayed.   GFR: Estimated Creatinine Clearance: 88.9 mL/min (by C-G formula based on SCr of 0.68 mg/dL). Liver Function Tests: Recent Labs  Lab 02/05/24 1524 02/06/24 0344  AST 61* 54*  ALT 26 23  ALKPHOS 69 61  BILITOT 0.7 0.7  PROT 5.7* 5.2*  ALBUMIN  2.4* 2.4*   No results for input(s): "LIPASE", "AMYLASE" in the last 168 hours. Recent Labs  Lab 02/11/24 0830  AMMONIA 19   Coagulation Profile: Recent Labs  Lab 02/06/24 0344  INR 1.1   Cardiac Enzymes: No results for input(s): "CKTOTAL", "CKMB", "CKMBINDEX", "TROPONINI" in the last 168 hours. BNP (last 3 results) No results for input(s): "PROBNP" in the last 8760 hours. HbA1C: No results for input(s): "HGBA1C" in the last 72 hours. CBG: Recent Labs  Lab 02/10/24 0620 02/10/24 1139 02/10/24 1630 02/10/24 2107 02/11/24 0552  GLUCAP 109* 96 75 83 77   Lipid Profile: No results for input(s): "CHOL", "HDL", "LDLCALC", "TRIG", "CHOLHDL", "LDLDIRECT" in the last 72 hours. Thyroid Function Tests: No results for input(s): "TSH", "T4TOTAL", "FREET4", "T3FREE",  "THYROIDAB" in the last 72 hours. Anemia Panel: Recent Labs    02/11/24 0830  VITAMINB12 889  FOLATE 8.5  FERRITIN 284  TIBC 248*  IRON 32*  RETICCTPCT 2.9   Urine analysis:    Component Value Date/Time   COLORURINE YELLOW 02/05/2024 1826   APPEARANCEUR CLEAR 02/05/2024 1826   LABSPEC 1.005 02/05/2024 1826   PHURINE 6.0 02/05/2024 1826   GLUCOSEU NEGATIVE 02/05/2024 1826   HGBUR NEGATIVE 02/05/2024 1826   BILIRUBINUR NEGATIVE 02/05/2024 1826   KETONESUR NEGATIVE  02/05/2024 1826   PROTEINUR NEGATIVE 02/05/2024 1826   NITRITE NEGATIVE 02/05/2024 1826   LEUKOCYTESUR NEGATIVE 02/05/2024 1826   Sepsis Labs: @LABRCNTIP (procalcitonin:4,lacticidven:4)  ) Recent Results (from the past 240 hours)  Culture, blood (routine x 2)     Status: None   Collection Time: 02/05/24  4:52 PM   Specimen: BLOOD  Result Value Ref Range Status   Specimen Description BLOOD SITE NOT SPECIFIED  Final   Special Requests   Final    BOTTLES DRAWN AEROBIC AND ANAEROBIC Blood Culture results may not be optimal due to an inadequate volume of blood received in culture bottles   Culture   Final    NO GROWTH 5 DAYS Performed at [facility], [address], [place], [place] [address]    Report Status 02/10/2024 FINAL  Final  MRSA Next Gen by PCR, Nasal     Status: None   Collection Time: 02/06/24  2:39 AM   Specimen: Nasal Mucosa; Nasal Swab  Result Value Ref Range Status   MRSA by PCR Next Gen NOT DETECTED NOT DETECTED Final    Comment: (NOTE) The GeneXpert MRSA Assay (FDA approved for NASAL specimens only), is one component of a comprehensive MRSA colonization surveillance program. It is not intended to diagnose MRSA infection nor to guide or monitor treatment for MRSA infections. Test performance is not FDA approved in patients less than 47 years old. Performed at [facility], [address], [place], [place] [address]   Culture, blood (routine x 2)     Status: None   Collection  Time: 02/06/24  3:44 AM   Specimen: BLOOD RIGHT ARM  Result Value Ref Range Status   Specimen Description BLOOD RIGHT ARM  Final   Special Requests   Final    BOTTLES DRAWN AEROBIC AND ANAEROBIC Blood Culture adequate volume   Culture   Final    NO GROWTH 5 DAYS Performed at [facility], [address], [place], [place] [address]    Report Status 02/11/2024 FINAL  Final     Radiology Studies: ECHOCARDIOGRAM LIMITED Result Date: 02/10/2024    ECHOCARDIOGRAM LIMITED REPORT   Patient Name:   Kyle Montoya Date of Exam: 02/10/2024 Medical Rec #:  [id number]      Height:       72.0 in Accession #:    [id number]     Weight:       190.0 lb Date of Birth:  July 10, 1951      BSA:          2.085 m Patient Age:    73 years       BP:           119/80 mmHg Patient Gender: M              HR:           77 bpm. Exam Location:  Inpatient Procedure: Limited Echo and Limited Color Doppler (Both Spectral and Color Flow            Doppler were utilized during procedure). Indications:    Dyspnea R06.00  History:        Patient has prior history of Echocardiogram examinations, most                 recent 12/02/2023. CHF, CAD, PAD and COPD,                 Signs/Symptoms:Hypotension; Risk Factors:Hypertension, Diabetes  and Dyslipidemia.  Sonographer:    Terrilee Few RCS Referring Phys: ARRIEN, MAURICIO, DANIEL IMPRESSIONS  1. Left ventricular ejection fraction, by estimation, is 65 to 70%. The left ventricle has normal function. The left ventricle has no regional wall motion abnormalities. Left ventricular diastolic parameters were normal.  2. Right ventricular systolic function is normal. The right ventricular size is normal. Tricuspid regurgitation signal is inadequate for assessing PA pressure.  3. No evidence of mitral valve regurgitation. No evidence of mitral stenosis.  4. The aortic valve is tricuspid. There is mild calcification of the aortic valve. There is mild thickening of the aortic valve.  Aortic valve regurgitation is moderate. Aortic valve sclerosis/calcification is present, without any evidence of aortic stenosis.  5. Aortic dilatation noted. There is mild dilatation of the aortic root, measuring 41 mm. There is mild dilatation of the ascending aorta, measuring 42 mm. FINDINGS  Left Ventricle: Left ventricular ejection fraction, by estimation, is 65 to 70%. The left ventricle has normal function. The left ventricle has no regional wall motion abnormalities. The left ventricular internal cavity size was normal in size. There is  no left ventricular hypertrophy. Left ventricular diastolic parameters were normal. Normal left ventricular filling pressure. Right Ventricle: The right ventricular size is normal. Right ventricular systolic function is normal. Tricuspid regurgitation signal is inadequate for assessing PA pressure. Pericardium: There is no evidence of pericardial effusion. Mitral Valve: There is mild thickening of the anterior mitral valve leaflet(s). There is mild calcification of the anterior mitral valve leaflet(s). No evidence of mitral valve stenosis. Aortic Valve: The aortic valve is tricuspid. There is mild calcification of the aortic valve. There is mild thickening of the aortic valve. Aortic valve regurgitation is moderate. Aortic valve sclerosis/calcification is present, without any evidence of aortic stenosis. Aortic valve peak gradient measures 7.5 mmHg. Pulmonic Valve: The pulmonic valve was grossly normal. Pulmonic valve regurgitation is not visualized. No evidence of pulmonic stenosis. Aorta: Aortic dilatation noted. There is mild dilatation of the aortic root, measuring 41 mm. There is mild dilatation of the ascending aorta, measuring 42 mm. Venous: The inferior vena cava was not well visualized. LEFT VENTRICLE PLAX 2D LVIDd:         4.80 cm   Diastology LVIDs:         2.90 cm   LV e' medial:    7.72 cm/s LV PW:         1.10 cm   LV E/e' medial:  9.5 LV IVS:        0.80 cm    LV e' lateral:   9.57 cm/s LVOT diam:     2.40 cm   LV E/e' lateral: 7.7 LV SV:         96 LV SV Index:   46 LVOT Area:     4.52 cm  RIGHT VENTRICLE RV S prime:     15.00 cm/s TAPSE (M-mode): 1.6 cm LEFT ATRIUM         Index LA diam:    3.60 cm 1.73 cm/m  AORTIC VALVE AV Area (Vmax): 3.40 cm AV Vmax:        137.00 cm/s AV Peak Grad:   7.5 mmHg LVOT Vmax:      103.00 cm/s LVOT Vmean:     64.500 cm/s LVOT VTI:       0.212 m  AORTA Ao Root diam: 4.10 cm Ao Asc diam:  4.20 cm MITRAL VALVE MV Area (PHT): 4.60 cm    SHUNTS MV  Decel Time: 165 msec    Systemic VTI:  0.21 m MV E velocity: 73.40 cm/s  Systemic Diam: 2.40 cm MV A velocity: 68.10 cm/s MV E/A ratio:  1.08 Mihai Croitoru MD Electronically signed by Luana Rumple MD Signature Date/Time: 02/10/2024/5:14:47 PM    Final    DG Swallowing Func-Speech Pathology Result Date: 02/09/2024 Table formatting from the original result was not included. Images from the original result were not included. Modified Barium Swallow Study Patient Details Name: Kyle Montoya MRN: [id number] Date of Birth: 16-Dec-1950 Today's Date: 02/09/2024 HPI/PMH: HPI: Patient is a 73 y.o. male with PMH: ETOH abuse, COPD, HTN, heart failure, ascending aortic aneurysm, h/o lung cancer s/p resection and cholangiocarcinoma. 2022 EGD reported that the distal esophagus was moderately tortuous. He presented to the hospital on 02/05/2024 with generalized weakness and lower extremity edema. On 5/16 he started withdrawing form alcohol and started on phenobarb and required vasopressor support. He was found to have a RLL PNA. SLP swallow evaluation ordered on 02/08/24 due to RN noting patient to exhibit coughing when drinking liquids. Review of records shows Dec 2009 DG cervical spine:  Severe degenerative disc disease changes.  Fusion from C2-C5  anteriorly due to flowing osteophytes. Clinical Impression: Pt presents with flowing anterior osteophytes from C2-C5 (documentated in Dec 2009 - no further imaging  of cervical vertebrae could be found subsequently).  These create a structural impediment to swallowing - obstructing epiglottic closure over the larynx, creating narrow pyriform sinuses (smaller "holding bay" for material), allowing residue to accumulate superior to epiglottis and within hypopharynx.  Materials spill into larynx/airway before, during, and after the swallow - thin liquids most likely to be aspirated. There is a reliable cough in response to aspiration, but it is weak and not sufficient to eject aspirate/penetrant from trachea and larynx.  Attempted to recline pt to redirect flow of barium into the esophagus and away from trachea, but this was not effective.  Mr. Baldree has lived with these large osteophytes for many years, but has decompensated and is unable to protect the airway and/or has more of the comorbidities that make him susceptible to aspiration pna. It is certainly possible that with improved mental status/awareness his function will improve.  D/W Dr. Arrien - for now, continue NPO except meds crushed with purees and ice chips after oral care. SLP will follow.  DIGEST Swallow Severity Rating*             Safety:            2             Efficiency: 2             Overall Pharyngeal Swallow Severity: 2 - moderate  1: mild; 2: moderate; 3: severe; 4: profound *The Dynamic Imaging Grade of Swallowing Toxicity is standardized for the head and neck cancer population, however, demonstrates promising clinical applications across populations to standardize the clinical rating of pharyngeal swallow safety and severity.  Factors that may increase risk of adverse event in presence of aspiration Roderick Civatte & Jessy Morocco 2021): Factors that may increase risk of adverse event in presence of aspiration Roderick Civatte & Jessy Morocco 2021): Respiratory or GI disease; Weak cough; Aspiration of thick, dense, and/or acidic materials Recommendations/Plan: Swallowing Evaluation Recommendations Swallowing Evaluation  Recommendations Recommendations: NPO; NPO except meds Medication Administration: Crushed with puree Oral care recommendations: Oral care QID (4x/day); Oral care before ice chips/water Treatment Plan Treatment Plan Treatment recommendations: Therapy as outlined in treatment plan below Follow-up  recommendations: Other (comment) (tba) Functional status assessment: Patient has had a recent decline in their functional status and demonstrates the ability to make significant improvements in function in a reasonable and predictable amount of time. Treatment frequency: Min 2x/week Treatment duration: 2 weeks Interventions: Patient/family education; Trials of upgraded texture/liquids; Diet toleration management by SLP Recommendations Recommendations for follow up therapy are one component of a multi-disciplinary discharge planning process, led by the attending physician.  Recommendations may be updated based on patient status, additional functional criteria and insurance authorization. Assessment: Orofacial Exam: Orofacial Exam Oral Cavity: Oral Hygiene: WFL Oral Cavity - Dentition: Edentulous Orofacial Anatomy: WFL Anatomy: Anatomy: Suspected cervical osteophytes (Fusion from C2-C5 anteriorly due to flowing osteophytes (per DG cervical spine 2009)) Boluses Administered: Boluses Administered Boluses Administered: Thin liquids (Level 0); Mildly thick liquids (Level 2, nectar thick); Puree (dys 2)  Oral Impairment Domain: Oral Impairment Domain Lip Closure: No labial escape Tongue control during bolus hold: Not tested Bolus preparation/mastication: Slow prolonged chewing/mashing with complete recollection Bolus transport/lingual motion: Slow tongue motion Oral residue: Trace residue lining oral structures Initiation of pharyngeal swallow : Pyriform sinuses  Pharyngeal Impairment Domain: Pharyngeal Impairment Domain Soft palate elevation: No bolus between soft palate (SP)/pharyngeal wall (PW) Laryngeal elevation: Complete  superior movement of thyroid cartilage with complete approximation of arytenoids to epiglottic petiole Anterior hyoid excursion: Complete anterior movement Epiglottic movement: Partial inversion Laryngeal vestibule closure: Incomplete, narrow column air/contrast in laryngeal vestibule Pharyngeal stripping wave : Present - complete Pharyngeal contraction (A/P view only): N/A Pharyngoesophageal segment opening: Partial distention/partial duration, partial obstruction of flow Tongue base retraction: Narrow column of contrast or air between tongue base and PPW Pharyngeal residue: Collection of residue within or on pharyngeal structures Location of pharyngeal residue: Valleculae; Pharyngeal wall; Pyriform sinuses  Esophageal Impairment Domain: Esophageal Impairment Domain Esophageal clearance upright position: Esophageal retention Pill: Pill Consistency administered: -- (NT) Penetration/Aspiration Scale Score: Penetration/Aspiration Scale Score 3.  Material enters airway, remains ABOVE vocal cords and not ejected out: Puree 5.  Material enters airway, CONTACTS cords and not ejected out: Mildly thick liquids (Level 2, nectar thick) 7.  Material enters airway, passes BELOW cords and not ejected out despite cough attempt by patient: Thin liquids (Level 0) Compensatory Strategies: Compensatory Strategies Compensatory strategies: Yes Reclining posture: Ineffective   General Information: Caregiver present: No  Diet Prior to this Study: NPO   Temperature : Normal   No data recorded  No data recorded  History of Recent Intubation: No  Behavior/Cognition: Alert; Requires cueing; Lethargic/Drowsy; Confused Self-Feeding Abilities: Other (Comment) (mitts on during MBS -fed by examiner) Baseline vocal quality/speech: Dysphonic Volitional Cough: Able to elicit Volitional Swallow: Able to elicit No data recorded Goal Planning: Prognosis for improved oropharyngeal function: Good No data recorded No data recorded Patient/Family Stated  Goal: nothing stated and no family present No data recorded Pain: Pain Assessment Pain Assessment: No/denies pain Faces Pain Scale: 0 End of Session: Start Time:SLP Start Time (ACUTE ONLY): 1418 Stop Time: SLP Stop Time (ACUTE ONLY): 1444 Time Calculation:SLP Time Calculation (min) (ACUTE ONLY): 26 min Charges: SLP Evaluations $ SLP Speech Visit: 1 Visit SLP Evaluations $BSS Swallow: 1 Procedure $MBS Swallow: 1 Procedure $Swallowing Treatment: 1 Procedure SLP visit diagnosis: SLP Visit Diagnosis: Dysphagia, pharyngeal phase (R13.13) Past Medical History: Past Medical History: Diagnosis Date  Abnormal colonoscopy 02/23/2021  Abnormal transaminases 02/27/2022  Acute diastolic (congestive) heart failure (HCC) 10/08/2023  Adenomatous polyp of descending colon 02/23/2021  Adverse drug reaction, initial encounter 07/04/2016  07/02/2016: Prevnar  23 vaccine given in right arm  07/02/2016: localized swelling  10/11/20167: Increase in swelling from shoulder to inner aspect of elbow.Red, warm and firm to touch.  No lip or airway swelling or stridor.  2+ brachial and radial pulses right arm.  Capillary refill < 2 seconds    Anemia in chronic illness 01/22/2013  Arthritis, degenerative 01/31/2015  Ascending aortic aneurysm (HCC) 06/27/2022  Benign fibroma of prostate 01/31/2015  BP (high blood pressure) 01/22/2013  BPH (benign prostatic hyperplasia)   CAD (coronary artery disease)   Cannot sleep 01/31/2015  Carcinoma of biliary tract (HCC) 02/15/2013  Diagnosed in 2014, Pt had [facility], under care by Dr. Kirk Peper Oncologist, has f/u every 6-12 months.  Cataract   bilateral sx  Cervical osteoarthritis 10/18/2014  Cholangiocarcinoma of biliary tract (HCC)   Chronic cough 01/31/2015  Chronic myelogenous leukemia (HCC)   Chronic pain 01/31/2015  CML (chronic myelocytic leukemia) (HCC) 10/18/2014  Overview:   Follows with [facility], Dr. Almer Jacobson. On dasatinib , last WBC 9.6.    Compulsive tobacco user  syndrome 01/31/2015  Congestive heart failure (CHF) (HCC) 09/26/2023  COPD (chronic obstructive pulmonary disease) (HCC)   uses inhaler  COPD with asthma (HCC) 01/22/2013  Severe obstruction on spirometry FeV1 54% FeV1/FVC 51% 01/31/2015  CXR NAD 01/25/15    Diuretic-induced hypokalemia 05/23/2023  DJD (degenerative joint disease)   Dyslipidemia   Family history of colonic polyps   Family history of prostate cancer   Full thickness rotator cuff tear 08/08/2020  Gout   High coronary artery calcium score 05/30/2023  History of colon polyps 02/23/2021  History of colonic polyps 10/01/2021  IMO SNOMED Dx Update Oct 2024    HLD (hyperlipidemia) 01/31/2015  HTN (hypertension)   on meds  Hypercalcemia 08/10/2020  Hypercholesterolemia   Hypocalcemia 08/27/2021  Hyponatremia 09/19/2023  Impingement syndrome of both shoulders 03/31/2020  Lung cancer (HCC)   Myelogenous leukemia (HCC)   Non-small cell carcinoma of lung (HCC) 01/31/2015  02/13/2015 CT Chest no evidence of recurrence in lung cancer  Stage I adenocarcinoma diagnosed with left upper lobectomy in February 2016    OA (osteoarthritis)   Orthostatic hypotension 10/10/2023  PAD (peripheral artery disease) (HCC)   Personal history of gastric ulcers 02/23/2021  Renal insufficiency   Shoulder pain, left 03/29/2020  Shoulder pain, right 03/29/2020  Tendonitis   patellar  Thoracic aortic aneurysm (HCC) 06/27/2022  Wheezing 01/31/2015 Past Surgical History: Past Surgical History: Procedure Laterality Date  BIOPSY  04/30/2021  Procedure: BIOPSY;  Surgeon: Normie Becton., MD;  Location: [facility];  Service: Gastroenterology;;  Bowel duct surg  2014  Bowel cancer  CARDIAC CATHETERIZATION  2000  CHOLECYSTECTOMY    COLONOSCOPY  06/23/2015  mild sigmoid diverticulosis. small external and internal hemorrhoids. otherwise normal colonoscopy to terminal ileum  COLONOSCOPY WITH PROPOFOL  N/A 04/30/2021  Procedure: COLONOSCOPY WITH PROPOFOL ;  Surgeon: Mansouraty, Albino Alu., MD;   Location: [facility];  Service: Gastroenterology;  Laterality: N/A;  ENDOSCOPIC MUCOSAL RESECTION N/A 04/30/2021  Procedure: ENDOSCOPIC MUCOSAL RESECTION;  Surgeon: Brice Campi Albino Alu., MD;  Location: [facility];  Service: Gastroenterology;  Laterality: N/A;  ESOPHAGOGASTRODUODENOSCOPY (EGD) WITH PROPOFOL  N/A 04/30/2021  Procedure: ESOPHAGOGASTRODUODENOSCOPY (EGD) WITH PROPOFOL ;  Surgeon: Brice Campi Albino Alu., MD;  Location: [facility];  Service: Gastroenterology;  Laterality: N/A;  HEMOSTASIS CLIP PLACEMENT  04/30/2021  Procedure: HEMOSTASIS CLIP PLACEMENT;  Surgeon: Normie Becton., MD;  Location: [facility];  Service: Gastroenterology;;  LUNG CANCER SURGERY  09/2014  partial lobectomy  POLYPECTOMY  04/30/2021  Procedure: POLYPECTOMY;  Surgeon: Mansouraty, Albino Alu., MD;  Location: [facility];  Service: Gastroenterology;;  SHOULDER OPEN ROTATOR CUFF REPAIR Right 09/27/2020  Procedure: Right shoulder mini open rotator cuff repair, distal clavicle resection;  Surgeon: Orvan Blanch, MD;  Location: [facility];  Service: Orthopedics;  Laterality: Right;  90 mins Choice with Block anesthesia  SUBMUCOSAL LIFTING INJECTION  04/30/2021  Procedure: SUBMUCOSAL LIFTING INJECTION;  Surgeon: Normie Becton., MD;  Location: [facility];  Service: Gastroenterology;;  UPPER GASTROINTESTINAL ENDOSCOPY    WHIPPLE PROCEDURE  02/25/2013  Procedure: WHIPPLE PROCEDURE; Surgeon: Kirk Peper, MD; Location: [facility] MAIN OR; Service: General; Laterality: N/A; Amanda L. Beatris Lincoln, MA CCC/SLP Clinical Specialist - Acute Care SLP Acute Rehabilitation Services Office number [phone] [name] 02/09/2024, 5:51 PM    Scheduled Meds:  Chlorhexidine  Gluconate Cloth  6 each Topical Daily   feeding supplement  237 mL Oral BID BM   folic acid   1 mg Oral Daily   midodrine   10 mg Oral TID WC   multivitamin with minerals  1 tablet Oral Daily   pantoprazole   40 mg Oral BID   tamsulosin   0.4 mg Oral  Daily   thiamine   100 mg Oral Q24H   Continuous Infusions:  thiamine  (VITAMIN B1) injection 200 mg (02/11/24 0939)     LOS: 6 days    Time spent:    Deforest Fast, MD [facility]   02/11/2024, 10:52 AM

## 2024-02-11 NOTE — Plan of Care (Signed)

## 2024-02-11 NOTE — Procedures (Signed)
 Cortrak  Person Inserting Tube:  Beula Joyner T, RD Tube Type:  Cortrak - 43 inches Tube Size:  10 Tube Location:  Left nare Secured by: Bridle Technique Used to Measure Tube Placement:  Marking at nare/corner of mouth Cortrak Secured At:  69 cm   Cortrak Tube Team Note:  Consult received to place a Cortrak feeding tube.   Xray has been ordered by the Cortrak team. Please confirm placement prior to use.  If the tube becomes dislodged please keep the tube and contact the Cortrak team at [email or web address] for replacement.  If after hours and replacement cannot be delayed, place a NG tube and confirm placement with an abdominal x-ray.    Scheryl Cushing RD, LDN Contact via [employer].

## 2024-02-11 NOTE — TOC CM/SW Note (Signed)
 Transition of Care [facility]) - Inpatient Brief Assessment   Patient Details  Name: Kyle Montoya MRN: [id number] Date of Birth: [birth year]  [facility] of Care [facility] CM/SW Contact:    Juliane Och, LCSW Phone Number: 02/11/2024, 11:52 AM   Clinical Narrative:  11:52 AM Per chart review, patient is not yet fully oriented for CSW to complete TOC initial assessment or consult (substance use education/counseling). CSW will continue to follow for orientation.   Transition of Care Asessment: Insurance and Status: Insurance coverage has been reviewed Patient has primary care physician: Yes Home environment has been reviewed: Private Residence Prior level of function:: N/A Prior/Current Home Services: No current home services (Has HH/DME History) Social Drivers of Health Review: SDOH reviewed no interventions necessary Readmission risk has been reviewed: Yes Transition of care needs: transition of care needs identified, TOC will continue to follow\

## 2024-02-11 NOTE — Plan of Care (Signed)
 Patient remains on MC-2C as of time of writing. Aspiration and fall precautions reinforced with patient overnight.    Problem: Education: Goal: Ability to describe self-care measures that may prevent or decrease complications (Diabetes Survival Skills Education) will improve Outcome: Progressing Goal: Individualized Educational Video(s) Outcome: Progressing   Problem: Coping: Goal: Ability to adjust to condition or change in health will improve Outcome: Progressing   Problem: Fluid Volume: Goal: Ability to maintain a balanced intake and output will improve Outcome: Progressing   Problem: Health Behavior/Discharge Planning: Goal: Ability to identify and utilize available resources and services will improve Outcome: Progressing Goal: Ability to manage health-related needs will improve Outcome: Progressing   Problem: Metabolic: Goal: Ability to maintain appropriate glucose levels will improve Outcome: Progressing   Problem: Nutritional: Goal: Maintenance of adequate nutrition will improve Outcome: Progressing Goal: Progress toward achieving an optimal weight will improve Outcome: Progressing   Problem: Skin Integrity: Goal: Risk for impaired skin integrity will decrease Outcome: Progressing   Problem: Tissue Perfusion: Goal: Adequacy of tissue perfusion will improve Outcome: Progressing   Problem: Education: Goal: Knowledge of General Education information will improve Description: Including pain rating scale, medication(s)/side effects and non-pharmacologic comfort measures Outcome: Progressing   Problem: Health Behavior/Discharge Planning: Goal: Ability to manage health-related needs will improve Outcome: Progressing   Problem: Clinical Measurements: Goal: Ability to maintain clinical measurements within normal limits will improve Outcome: Progressing Goal: Will remain free from infection Outcome: Progressing Goal: Diagnostic test results will improve Outcome:  Progressing Goal: Respiratory complications will improve Outcome: Progressing Goal: Cardiovascular complication will be avoided Outcome: Progressing   Problem: Activity: Goal: Risk for activity intolerance will decrease Outcome: Progressing   Problem: Nutrition: Goal: Adequate nutrition will be maintained Outcome: Progressing   Problem: Coping: Goal: Level of anxiety will decrease Outcome: Progressing   Problem: Elimination: Goal: Will not experience complications related to bowel motility Outcome: Progressing Goal: Will not experience complications related to urinary retention Outcome: Progressing   Problem: Pain Managment: Goal: General experience of comfort will improve and/or be controlled Outcome: Progressing   Problem: Safety: Goal: Ability to remain free from injury will improve Outcome: Progressing   Problem: Skin Integrity: Goal: Risk for impaired skin integrity will decrease Outcome: Progressing

## 2024-02-12 DIAGNOSIS — J189 Pneumonia, unspecified organism: Secondary | ICD-10-CM | POA: Diagnosis not present

## 2024-02-12 LAB — GLUCOSE, CAPILLARY
Glucose-Capillary: 108 mg/dL — ABNORMAL HIGH (ref 70–99)
Glucose-Capillary: 114 mg/dL — ABNORMAL HIGH (ref 70–99)
Glucose-Capillary: 115 mg/dL — ABNORMAL HIGH (ref 70–99)
Glucose-Capillary: 121 mg/dL — ABNORMAL HIGH (ref 70–99)
Glucose-Capillary: 124 mg/dL — ABNORMAL HIGH (ref 70–99)
Glucose-Capillary: 126 mg/dL — ABNORMAL HIGH (ref 70–99)
Glucose-Capillary: 135 mg/dL — ABNORMAL HIGH (ref 70–99)

## 2024-02-12 LAB — PHOSPHORUS: Phosphorus: 2.8 mg/dL (ref 2.5–4.6)

## 2024-02-12 LAB — BASIC METABOLIC PANEL WITH GFR
Anion gap: 8 (ref 5–15)
BUN: 11 mg/dL (ref 8–23)
CO2: 22 mmol/L (ref 22–32)
Calcium: 8.3 mg/dL — ABNORMAL LOW (ref 8.9–10.3)
Chloride: 108 mmol/L (ref 98–111)
Creatinine, Ser: 0.84 mg/dL (ref 0.61–1.24)
GFR, Estimated: >60 mL/min (ref >60)
Glucose, Bld: 119 mg/dL — ABNORMAL HIGH (ref 70–99)
Potassium: 3.9 mmol/L (ref 3.5–5.1)
Sodium: 138 mmol/L (ref 135–145)

## 2024-02-12 LAB — MAGNESIUM: Magnesium: 1.8 mg/dL (ref 1.7–2.4)

## 2024-02-12 LAB — CBC
HCT: 21.5 % — ABNORMAL LOW (ref 39.0–52.0)
Hemoglobin: 7.4 g/dL — ABNORMAL LOW (ref 13.0–17.0)
MCH: 36.3 pg — ABNORMAL HIGH (ref 26.0–34.0)
MCHC: 34.4 g/dL (ref 30.0–36.0)
MCV: 105.4 fL — ABNORMAL HIGH (ref 80.0–100.0)
Platelets: 239 10*3/uL (ref 150–400)
RBC: 2.04 MIL/uL — ABNORMAL LOW (ref 4.22–5.81)
RDW: 15.9 % — ABNORMAL HIGH (ref 11.5–15.5)
WBC: 3.8 10*3/uL — ABNORMAL LOW (ref 4.0–10.5)
nRBC: 1.9 % — ABNORMAL HIGH (ref 0.0–0.2)

## 2024-02-12 MED ORDER — SENNOSIDES-DOCUSATE SODIUM 8.6-50 MG PO TABS
1.0000 | ORAL_TABLET | Freq: Two times a day (BID) | ORAL | Status: DC
  Administered 2024-02-12 – 2024-02-16 (×9): 1
  Filled 2024-02-12 (×9): qty 1

## 2024-02-12 MED ORDER — FREE WATER
200.0000 mL | Freq: Four times a day (QID) | Status: DC
Start: 2024-02-12 — End: 2024-02-16
  Administered 2024-02-12 – 2024-02-16 (×17): 200 mL

## 2024-02-12 MED ORDER — HALOPERIDOL LACTATE 5 MG/ML IJ SOLN
1.0000 mg | Freq: Four times a day (QID) | INTRAMUSCULAR | Status: DC | PRN
  Administered 2024-02-14: 1 mg via INTRAVENOUS
  Filled 2024-02-12: qty 1

## 2024-02-12 MED ORDER — QUETIAPINE FUMARATE 25 MG PO TABS
25.0000 mg | ORAL_TABLET | Freq: Every day | ORAL | Status: DC
  Administered 2024-02-12 – 2024-02-18 (×7): 25 mg via ORAL
  Filled 2024-02-12 (×7): qty 1

## 2024-02-12 MED ORDER — LACTULOSE 10 GM/15ML PO SOLN
20.0000 g | Freq: Two times a day (BID) | ORAL | Status: AC
  Administered 2024-02-12 – 2024-02-13 (×4): 20 g
  Filled 2024-02-12 (×4): qty 30
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
## 2024-02-12 NOTE — Progress Notes (Incomplete)
 [facility]  [address] [place],  [place]  [address] [phone]  Clinic Day: 02/12/24   Referring physician: Teofilo Fellers, NP  ASSESSMENT & PLAN:  Assessment:   1. 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  He will continue to take dastinib 100 mg daily,  However, his recent chest CT showed bilateral pleural effusions, which are new.  As there is no history of congestive heart failure, the question is are these effusions related to his dasatinib .  He will have a chest x-ray in 3 months.  If his effusions are worse, then a change in CML therapy would need to be considered.  He denies being significantly short of breath at this time.  His CML transcript levels will be reassessed in 3 months   2. History of stage IA non-small cell lung cancer in 2016 treated with surgery alone.  His October, 2024 CT scans showed no evidence of disease recurrence.     3. History of stage IA cholangiocarcinoma in 2014 treated with surgery alone. His October, 2024 CT scans showed no evidence of disease recurrence.     4. B12 deficiency.  He was on B12 injections monthly with his primary care office. He missed his last one and so it has been over a month since his last one and yet his level was good at 667 so I told him he can stop the B-12 injections for now. His B12 level will be rechecked before his next visit.   5. Alcoholic Hepatitis. He knows he needs to quit drinking but is struggling to do so.   Plan: Patient was admitted into the hospital in January, 2025 and was found to have acute congestive heart failure. He was instructed to stop Lasix  and was placed on torsemide  20 mg and recommened follow-up with his cardiologist. He followed up with his cardiologist and received a good report from him. He had a echocardiogram in December, 2024 which was normal. I agreed with stopping Lasix  and will take that off of his med list. I instructed him to continue taking torsemide  20 mg at 3 pills  daily as instructed. He had labs done on 10/21/2023 and he had a WBC of 5.0, hemoglobin of 13.6, and platelet count of 256,000. He had a slightly low sodium of 134, a elevated AST of 78, and his potassium was low-normal at 3.5 improved from 3.4. The rest of his CMP was normal. I instructed him to stay on his oral potassium 2-3 daily and try to increase his fluids. PCR for BCR/ABL was negative. However, his CEA dropped from 7.9 to 3.33 but his CA 19.9 was elevated at 46 up from 36.  He notes not having a B-12 injection for over a month now. His B-12 level was 667 as of January, 2025 and was previously 370. I advised that he stop his B-12 injections and will recheck his levels in the future. I also provided a copy of his labs to bring to his next PCP appointment. I will see him back in 3 months with CBC, CMP, CEA, CA 19.9, and PCR for BCR/ABL done a week before his appointment with me in April. The patient understands the plans discussed today and is in agreement with them.  He knows to contact our office if he develops concerns prior to his next appointment.   I provided 20 minutes of face-to-face time during this encounter and > 50% was spent counseling as documented under my assessment and  plan.   Nolia Baumgartner, MD [name] - A DEPT OF [facility] [address] [place] [address] Dept: [phone] Dept Fax: [phone]   No orders of the defined types were placed in this encounter.   CHIEF COMPLAINT:  CC: Chronic myelogenous leukemia with history of lung cancer and cholangiocarcinoma  Current Treatment: Dasatinib  100 mg daily  HISTORY OF PRESENT ILLNESS:  The patient is a 73 year old man with CML which has been kept under ideal control with dasatinib .  He comes in today for routine follow-up, as well as to go his CT scans, which were done to ensure there is no radiographic evidence of disease recurrence, as it pertains to both  his early stage lung and biliary cancers.  Since his last visit, the patient has been doing well.  He denies having any new symptoms or findings which concern him for overt signs of disease recurrence.    Chronic myelogenous leukemia diagnosed in October 2015 when he presented with leukocytosis.  He was originally placed on nilotinib, but developed a severe pruritic rash, so was switched to dasatinib  100 mg daily.  He achieved a major molecular remission by May 2016.   He also has a history of a stage IA cholangiocarcinoma, treated with surgical resection in June 2014.   He then underwent surgical resection of a stage IA (T1 N0 M0) non-small cell lung cancer in February 2016.  Pathology revealed a 3 cm, well-differentiated, adenocarcinoma with negative nodes.  Adjuvant chemotherapy for his lung cancer was not recommended.  He has chronic back pain and continues seeing spine specialist.  He apparently has significant degenerative disc disease.  He has also had upper abdominal pain, which he attributes to dasatinib .  He uses hydrocodone /APAP as needed for his pain.  In May 2018, PCR was positive at 0.012%, consistent with residual CML.  Repeat PCR in August 2018 revealed a major molecular response.  His CML has remained in remission since that time.  CT chest, abdomen and pelvis in November 2019 did not reveal evidence of recurrence of either malignancy.     CT chest in August 2020 did not reveal any evidence of recurrent lung malignancy.  He has had hypocalcemia, so was instructed to take calcium 600 mg twice daily.  When he was seen in November 2020, had been having lower leg pain worsening throughout the day.  He underwent arterial ultrasound/ankle brachial indices, which was normal.  The pain was then felt to possibly be degenerative in nature.  He reported constipation due to the calcium and as his on calcium was normal at that time,  we decreased the calcium to daily.  He had worsening neutropenia and was  found to have B12 and placed on B12 injections weekly for 4 weeks, then monthly.  Serum folate was normal.  He had persistent abdominal pain, so was referred to Dr. Venice Gillis.  He was seen by Dr. Venice Gillis in 2021 and had 2 gastric ulcers and H. Pylori infection, which was treated.  He saw Dr. Reginal Capra earlier in 2021 as well, and PSA was normal. When he was seen in August, he had recurrent hypocalcemia, with a calcium of 8.3.  He could not tolerate the calcium supplement, so we recommended that he take Tums 4 times daily.  He continued to complain of intermittent abdominal pain, which we have attributed to adhesions.  He has had rotator cuff surgery in January 2022.  He has had  a colonoscopy by Dr. Venice Gillis with findings of a large polyp.  He will be going back on August 8th to have this resected through colonoscopy.  He missed his appointment in May but Dr. Venice Gillis told him "his labs were good".  I looked up and his PCR for BCR/ABL continues to be negative on the May reading.  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HCC)  02/15/2013 Initial Diagnosis   Carcinoma of biliary tract (HCC)   03/06/2013 Cancer Staging   Staging form: Distal Bile Ducts, AJCC 7th Edition - Clinical stage from 03/06/2013: Stage IA (T1, N0, M0) - Signed by Nolia Baumgartner, MD on 11/09/2020 Staged by: Managing physician Diagnostic confirmation: Positive histology Specimen type: Excision Histopathologic type: Cholangiocarcinoma Stage prefix: Initial diagnosis Lymph-vascular invasion (LVI): LVI not present (absent)/not identified Residual tumor (R): R0 - None Stage used in treatment planning: Yes National guidelines used in treatment planning: Yes Type of national guideline used in treatment planning: NCCN   Non-small cell carcinoma of lung (HCC)  11/08/2014 Cancer Staging   Staging form: Lung, AJCC 7th Edition - Clinical stage from 11/08/2014: Stage IA (T1b, N0, M0) - Signed by Nolia Baumgartner, MD on 11/09/2020 Staged by:  Managing physician Diagnostic confirmation: Positive histology Specimen type: Excision Histopathologic type: Adenocarcinoma, NOS Stage prefix: Initial diagnosis Laterality: Left Tumor size (mm): 30 Histologic grade (G): G1 Lymph-vascular invasion (LVI): LVI not present (absent)/not identified Residual tumor (R): R0 - None Pleural/elastic layer invasion: PL0 Prognostic indicators: Resection LUL Stage used in treatment planning: Yes National guidelines used in treatment planning: Yes Type of national guideline used in treatment planning: NCCN   01/31/2015 Initial Diagnosis   Non-small cell carcinoma of lung (HCC)      INTERVAL HISTORY:  Latrell is here for routine follow up for chronic myelogenous leukemia with history of lung cancer and cholangiocarcinoma. Patient states that he feels *** and ***.       He denies fever, chills, night sweats, or other signs of infection. He denies cardiorespiratory and gastrointestinal issues. He  denies pain. His appetite is *** and His weight {Weight change:10426}.  Patient states that he feels well and has no complaints of pain. He endorses not smoking in the last 6 months but continues to drink some alcohol. Patient was admitted into the hospital in January, 2025 and was found to have acute congestive heart failure. He was instructed to stop Lasix  and was placed on torsemide  20 mg and recommened follow-up with his cardiologist. He followed up with his cardiologist and received a good report from him. He had a echocardiogram in December, 2024 which was normal. I agreed with stopping Lasix  and will take that off of his med list. I instructed him to continue taking torsemide  20 mg but 3 pills daily as instructed. He had labs done on 10/21/2023 and he had a WBC of 5.0, hemoglobin of 13.6, and platelet count of 256,000. He had a slightly low sodium of 134, a elevated AST of 78, and his potassium was low-normal at 3.5 improved from 3.4. The rest of his CMP  was normal. I instructed him to stay on his oral potassium 2-3 daily and try to increase his fluids. PCR for BCR/ABL was negative. However, his CEA dropped from 7.9 to 3.33 but his CA 19.9 was elevated at 46 up from 36.  He notes not having a B-12 injection for over a month now. His B-12 level was 667 as of January, 2025 and was previously 370. I  advised that he stop his B-12 injections and will recheck his levels in the future. I also provided a copy of his labs to bring to his next PCP appointment. I will see him back in 3 months with CBC, CMP, CEA, CA 19.9, and PCR for BCR/ABL done a week before his appointment with me in April. He denies signs of infection such as sore throat, sinus drainage, cough, or urinary symptoms.  He denies fevers or recurrent chills. He denies pain. He denies nausea, vomiting, chest pain, dyspnea or cough. His appetite is good and his weight has increased 7 pounds over last 3 weeks.  REVIEW OF SYSTEMS:  Review of Systems  Constitutional: Negative.  Negative for appetite change, chills, diaphoresis, fatigue, fever and unexpected weight change.  HENT:  Negative.  Negative for hearing loss, lump/mass, mouth sores, nosebleeds, sore throat, tinnitus, trouble swallowing and voice change.   Eyes: Negative.  Negative for eye problems and icterus.  Respiratory: Negative.  Negative for chest tightness, cough, hemoptysis, shortness of breath and wheezing.   Cardiovascular: Negative.  Negative for chest pain, leg swelling and palpitations.  Gastrointestinal: Negative.  Negative for abdominal distention, abdominal pain, blood in stool, constipation, diarrhea, nausea, rectal pain and vomiting.  Endocrine: Negative.   Genitourinary: Negative.  Negative for bladder incontinence, difficulty urinating, dyspareunia, dysuria, frequency, hematuria, nocturia, pelvic pain and penile discharge.   Musculoskeletal: Negative.  Negative for arthralgias, back pain, flank pain, gait problem, myalgias,  neck pain and neck stiffness.  Skin: Negative.  Negative for itching, rash and wound.  Neurological: Negative.  Negative for dizziness, extremity weakness, gait problem, headaches, light-headedness, numbness, seizures and speech difficulty.  Hematological: Negative.  Negative for adenopathy. Does not bruise/bleed easily.  Psychiatric/Behavioral: Negative.  Negative for confusion, decreased concentration, depression, sleep disturbance and suicidal ideas. The patient is not nervous/anxious.   All other systems reviewed and are negative.  VITALS:  There were no vitals taken for this visit.  Wt Readings from Last 3 Encounters:  02/12/24 184 lb 14.4 oz (83.9 kg)  12/11/23 193 lb 12.6 oz (87.9 kg)  10/30/23 208 lb 3.2 oz (94.4 kg)    There is no height or weight on file to calculate BMI.  Performance status (ECOG): 0 - Asymptomatic  PHYSICAL EXAM:  Physical Exam Vitals and nursing note reviewed.  Constitutional:      General: He is not in acute distress.    Appearance: Normal appearance. He is normal weight. He is not ill-appearing, toxic-appearing or diaphoretic.  HENT:     Head: Normocephalic and atraumatic.     Right Ear: Tympanic membrane, ear canal and external ear normal. There is no impacted cerumen.     Left Ear: Tympanic membrane, ear canal and external ear normal. There is no impacted cerumen.     Nose: Nose normal. No congestion or rhinorrhea.     Mouth/Throat:     Mouth: Mucous membranes are moist.     Pharynx: Oropharynx is clear. No oropharyngeal exudate or posterior oropharyngeal erythema.  Eyes:     General: No scleral icterus.       Right eye: No discharge.        Left eye: No discharge.     Extraocular Movements: Extraocular movements intact.     Conjunctiva/sclera: Conjunctivae normal.     Pupils: Pupils are equal, round, and reactive to light.  Neck:     Vascular: No carotid bruit.  Cardiovascular:     Rate and Rhythm: Normal rate and regular  rhythm.      Pulses: Normal pulses.     Heart sounds: Normal heart sounds. No murmur heard.    No friction rub. No gallop.  Pulmonary:     Effort: Pulmonary effort is normal. No respiratory distress.     Breath sounds: Normal breath sounds. No stridor. No wheezing, rhonchi or rales.  Chest:     Chest wall: No tenderness.  Abdominal:     General: Bowel sounds are normal. There is no distension.     Palpations: Abdomen is soft. There is no hepatomegaly, splenomegaly or mass.     Tenderness: There is abdominal tenderness in the right upper quadrant. There is no right CVA tenderness, left CVA tenderness, guarding or rebound.     Hernia: No hernia is present.     Comments: Tenderness in the right upper quadrant Mild hepatomegaly  Musculoskeletal:        General: No swelling, tenderness, deformity or signs of injury. Normal range of motion.     Cervical back: Normal range of motion and neck supple. No rigidity or tenderness.     Right lower leg: No edema.     Left lower leg: No edema.  Lymphadenopathy:     Cervical: No cervical adenopathy.  Skin:    General: Skin is warm and dry.     Coloration: Skin is not jaundiced or pale.     Findings: No bruising, erythema, lesion or rash.  Neurological:     General: No focal deficit present.     Mental Status: He is alert and oriented to person, place, and time. Mental status is at baseline.     Cranial Nerves: No cranial nerve deficit.     Sensory: No sensory deficit.     Motor: No weakness.     Coordination: Coordination normal.     Gait: Gait normal.     Deep Tendon Reflexes: Reflexes normal.  Psychiatric:        Mood and Affect: Mood normal.        Behavior: Behavior normal.        Thought Content: Thought content normal.        Judgment: Judgment normal.    LABS:      Latest Ref Rng & Units 02/12/2024    2:35 AM 02/11/2024    2:49 AM 02/09/2024    2:30 AM  CBC  WBC 4.0 - 10.5 K/uL 3.8  3.2  4.1   Hemoglobin 13.0 - 17.0 g/dL 7.4  7.5  8.5    Hematocrit 39.0 - 52.0 % 21.5  22.1  25.4   Platelets 150 - 400 K/uL 239  208  190       Latest Ref Rng & Units 02/12/2024    2:35 AM 02/11/2024    2:49 AM 02/10/2024    3:15 AM  CMP  Glucose 70 - 99 mg/dL 725  76  97   BUN 8 - 23 mg/dL 11  9  13    Creatinine 0.61 - 1.24 mg/dL 3.66  4.40  3.47   Sodium 135 - 145 mmol/L 138  137  134   Potassium 3.5 - 5.1 mmol/L 3.9  3.6  4.1   Chloride 98 - 111 mmol/L 108  105  102   CO2 22 - 32 mmol/L 22  23  21    Calcium 8.9 - 10.3 mg/dL 8.3  8.1  8.3    Lab Results  Component Value Date   CEA1 7.9 (H) 07/17/2023   CEA 1.11  01/20/2024   /  CEA  Date Value Ref Range Status  07/17/2023 7.9 (H) 0.0 - 4.7 ng/mL Final    Comment:    (NOTE)                             Nonsmokers          <3.9                             Smokers             <5.6 [employer] Electrochemiluminescence Immunoassay (ECLIA) Values obtained with different assay methods or kits cannot be used interchangeably.  Results cannot be interpreted as absolute evidence of the presence or absence of malignant disease. Performed At: [facility] [address] [place], [place] [id number] Pearlean Botts MD WU:[id number]    CEA ([facility])  Date Value Ref Range Status  01/20/2024 1.11 0.00 - 5.00 ng/mL Final    Comment:    (NOTE) This test was performed using [employer]'s paramagnetic chemiluminescent immunoassay. Values obtained from different assay methods cannot be used interchangeably. Please note that up to 8% of patients who smoke may see values 5.1-10.0 ng/ml and 1% of patients who smoke may see CEA levels >10.0 ng/ml. Performed at [facility], [address] [place], [place] [address]    Lab Results  Component Value Date   [id number] 01/20/2024   STUDIES:  EXAM: 10/21/2023 CHEST - 2 VIEW IMPRESSION: Stable small right pleural effusion and mild right basilar atelectasis. No new or progressive disease.  HISTORY:    Allergies:  Allergies  Allergen Reactions   Pneumococcal Vaccines Swelling    Current Medications: No current facility-administered medications for this visit.   No current outpatient medications on file.   Facility-Administered Medications Ordered in Other Visits  Medication Dose Route Frequency Provider Last Rate Last Admin   Chlorhexidine  Gluconate Cloth 2 % PADS 6 each  6 each Topical Daily Claven Cumming, MD   6 each at 02/10/24 1337   famotidine (PEPCID) tablet 40 mg  40 mg Per Tube BID Sundil, Subrina, MD   40 mg at 02/12/24 0944   feeding supplement (OSMOLITE 1.5 CAL) liquid 1,000 mL  1,000 mL Per Tube Continuous Joseph, Preetha, MD 45 mL/hr at 02/12/24 0801 Rate Change at 02/12/24 0801   feeding supplement (PROSource TF20) liquid 60 mL  60 mL Per Tube Daily Deforest Fast, MD   60 mL at 02/12/24 0944   folic acid  (FOLVITE ) tablet 1 mg  1 mg Per Tube Daily Sundil, Subrina, MD   1 mg at 02/12/24 0944   free water 200 mL  200 mL Per Tube Q6H Deforest Fast, MD   200 mL at 02/12/24 0944   haloperidol lactate (HALDOL) injection 1 mg  1 mg Intravenous Q6H PRN Joseph, Preetha, MD       ipratropium-albuterol  (DUONEB) 0.5-2.5 (3) MG/3ML nebulizer solution 3 mL  3 mL Nebulization Q4H PRN Arrien, Curlee Doss, MD   3 mL at 02/11/24 1606   iron sucrose (VENOFER) 200 mg in sodium chloride  0.9 % 100 mL IVPB  200 mg Intravenous Q24H Deforest Fast, MD   Stopped at 02/11/24 1346   midodrine  (PROAMATINE ) tablet 10 mg  10 mg Per Tube TID WC Sundil, Subrina, MD   10 mg at 02/12/24 0943   multivitamin with minerals tablet 1 tablet  1 tablet Per  Tube Daily Sundil, Subrina, MD   1 tablet at 02/12/24 0944   ondansetron  (ZOFRAN ) injection 4 mg  4 mg Intravenous Q6H PRN Claven Cumming, MD       Oral care mouth rinse  15 mL Mouth Rinse PRN Claven Cumming, MD       polyethylene glycol (MIRALAX  / GLYCOLAX ) packet 17 g  17 g Oral Daily PRN Claven Cumming, MD       QUEtiapine (SEROQUEL) tablet 25 mg  25 mg  Oral QHS Deforest Fast, MD       thiamine  (VITAMIN B1) 200 mg in sodium chloride  0.9 % 50 mL IVPB  200 mg Intravenous Q24H Sundil, Subrina, MD       Or   thiamine  (VITAMIN B1) tablet 100 mg  100 mg Per Tube Q24H Sundil, Subrina, MD   100 mg at 02/12/24 0944    I,Jasmine M Lassiter,acting as a scribe for Nolia Baumgartner, MD.,have documented all relevant documentation on the behalf of Nolia Baumgartner, MD,as directed by  Nolia Baumgartner, MD while in the presence of Nolia Baumgartner, MD.  I have reviewed this report as typed by the medical scribe, and it is complete and accurate.

## 2024-02-12 NOTE — Evaluation (Signed)
 Physical Therapy Evaluation Patient Details Name: Kyle Montoya MRN: [id number] DOB: 12/15/1950 Today's Date: 02/12/2024  History of Present Illness  73 y.o. male presents to [facility] 02/05/24 with generalized weakness and LE edema. Admitted to ICU (5/15-5/18) with septic shock due to RLL PNA 2/2 aspiration and hypotension. 5/16 withdraw from alcohol, CIWA protocol. Complicated with heart failure. PMHx:HFpEF, alcohol abuse, cervical DDD,Gout, Asthma, COPD, Hyponatremia, Pneumonia, HTN, Anemia, Ascending Aortic Aneurysm, Gastric Ulcer disease with H. Pylori, paroxysmal Atrial Fibrillation, Lung Cancer stage IA 2016 s/p resection, CML diagnosed 2015, Cholangiocarcinoma, B12 defeciency, Alcoholic Hepatitis   Clinical Impression  Pt in bed upon arrival and agreeable to PT eval. Pt reported living alone and was ModI with a rollator. Unclear if reliable historian due to AMS upon eval. In today's session, pt required ModAx2 for bed mobility and ModAx2 for safety to stand with RW. Pt was then able to ambulate to the bathroom with MinA to navigate with RW and x2 for safety. Pt presents below prior functional mobility level and would benefit from <3hrs post acute rehab to work towards PLOF. Pt currently with functional limitations due to the deficits listed below (see PT Problem List). Pt would benefit from acute skilled PT to address functional impairments. Acute PT to follow.         If plan is discharge home, recommend the following: A lot of help with walking and/or transfers;A lot of help with bathing/dressing/bathroom;Assistance with cooking/housework;Assist for transportation;Help with stairs or ramp for entrance   Can travel by private vehicle   Yes    Equipment Recommendations None recommended by PT     Functional Status Assessment Patient has had a recent decline in their functional status and demonstrates the ability to make significant improvements in function in a reasonable and predictable  amount of time.     Precautions / Restrictions Precautions Precautions: Fall Recall of Precautions/Restrictions: Impaired Precaution/Restrictions Comments: cortrak Restrictions Weight Bearing Restrictions Per Provider Order: No      Mobility  Bed Mobility Overal bed mobility: Needs Assistance Bed Mobility: Supine to Sit    Supine to sit: Mod assist, +2 for physical assistance, +2 for safety/equipment, HOB elevated    General bed mobility comments: ModAx2 to complete moving LE's off EOB and raise trunk. ModA to scoot hips forward using bed pad    Transfers Overall transfer level: Needs assistance Equipment used: Rolling walker (2 wheels) Transfers: Sit to/from Stand Sit to Stand: Mod assist, +2 safety/equipment    General transfer comment: ModA for boost-up and steadying, cues for hand placement on RW after completing stand. x2 for safety    Ambulation/Gait Ambulation/Gait assistance: +2 safety/equipment, Min assist Gait Distance (Feet): 30 Feet Assistive device: Rolling walker (2 wheels) Gait Pattern/deviations: Step-through pattern, Decreased stride length, Shuffle Gait velocity: decr     General Gait Details: short and shuffled steps with MinA for RW management. Cues for sequencing when turning    Balance Overall balance assessment: Needs assistance, Mild deficits observed, not formally tested Sitting-balance support: No upper extremity supported, Feet supported Sitting balance-Leahy Scale: Good     Standing balance support: Bilateral upper extremity supported, During functional activity, Reliant on assistive device for balance Standing balance-Leahy Scale: Poor Standing balance comment: reliant on RW          Pertinent Vitals/Pain Pain Assessment Pain Assessment: No/denies pain    Home Living Family/patient expects to be discharged to:: Private residence Living Arrangements: Alone Available Help at Discharge: Family Type of Home: House Home Access:  Ramped entrance       Home Layout: One level Home Equipment: Grab bars - tub/shower;Grab bars - toilet;Rolling Walker (2 wheels);Rollator (4 wheels)      Prior Function Prior Level of Function : Independent/Modified Independent;Patient poor historian/Family not available    Mobility Comments: reports using rollator for mobility ADLs Comments: reports modified independence with ADLs, unsure of IADLs     Extremity/Trunk Assessment   Upper Extremity Assessment Upper Extremity Assessment: Defer to OT evaluation    Lower Extremity Assessment Lower Extremity Assessment: Generalized weakness    Cervical / Trunk Assessment Cervical / Trunk Assessment: Normal  Communication   Communication Communication: Impaired Factors Affecting Communication: Reduced clarity of speech    Cognition Arousal: Alert Behavior During Therapy: WFL for tasks assessed/performed, Restless   PT - Cognitive impairments: No family/caregiver present to determine baseline, Sequencing, Problem solving, Safety/Judgement    PT - Cognition Comments: Focused on calling family, redirected after attempting to call different family members. Cues for safety and navigation in room Following commands: Impaired Following commands impaired: Only follows one step commands consistently     Cueing Cueing Techniques: Verbal cues, Tactile cues      PT Assessment Patient needs continued PT services  PT Problem List Decreased strength;Decreased activity tolerance;Decreased balance;Decreased mobility;Decreased cognition;Decreased knowledge of use of DME;Decreased safety awareness       PT Treatment Interventions DME instruction;Gait training;Functional mobility training;Stair training;Therapeutic activities;Therapeutic exercise;Neuromuscular re-education;Balance training;Patient/family education    PT Goals (Current goals can be found in the Care Plan section)  Acute Rehab PT Goals Patient Stated Goal: to get better PT  Goal Formulation: With patient Time For Goal Achievement: 02/26/24 Potential to Achieve Goals: Good    Frequency Min 2X/week     Co-evaluation   Reason for Co-Treatment: For patient/therapist safety;To address functional/ADL transfers;Necessary to address cognition/behavior during functional activity PT goals addressed during session: Mobility/safety with mobility;Balance;Proper use of DME         AM-PAC PT "6 Clicks" Mobility  Outcome Measure Help needed turning from your back to your side while in a flat bed without using bedrails?: A Lot Help needed moving from lying on your back to sitting on the side of a flat bed without using bedrails?: A Lot Help needed moving to and from a bed to a chair (including a wheelchair)?: A Lot Help needed standing up from a chair using your arms (e.g., wheelchair or bedside chair)?: A Lot Help needed to walk in hospital room?: A Little Help needed climbing 3-5 steps with a railing? : A Lot 6 Click Score: 13    End of Session Equipment Utilized During Treatment: Gait belt Activity Tolerance: Patient tolerated treatment well Patient left: in bed;with call bell/phone within reach;with bed alarm set Nurse Communication: Mobility status PT Visit Diagnosis: Unsteadiness on feet (R26.81);Other abnormalities of gait and mobility (R26.89);Muscle weakness (generalized) (M62.81)    Time: 6045-4098 PT Time Calculation (min) (ACUTE ONLY): 24 min   Charges:   PT Evaluation $PT Eval Low Complexity: 1 Low   PT General Charges $$ ACUTE PT VISIT: 1 Visit       Orysia Blas, PT, DPT Secure Chat Preferred  Rehab Office [phone]   Alissa April Adela Ades 02/12/2024, 9:40 AM

## 2024-02-12 NOTE — Evaluation (Signed)
 Occupational Therapy Evaluation Patient Details Name: Kyle Montoya MRN: [id number] DOB: 22-Nov-1950 Today's Date: 02/12/2024   History of Present Illness   73 y.o. male presents to [facility] 02/05/24 with generalized weakness and LE edema. Admitted to ICU (5/15-5/18) with septic shock due to RLL PNA 2/2 aspiration and hypotension. 5/16 withdraw from alcohol, CIWA protocol. Complicated with heart failure. PMHx:HFpEF, alcohol abuse, cervical DDD,Gout, Asthma, COPD, Hyponatremia, Pneumonia, HTN, Anemia, Ascending Aortic Aneurysm, Gastric Ulcer disease with H. Pylori, paroxysmal Atrial Fibrillation, Lung Cancer stage IA 2016 s/p resection, CML diagnosed 2015, Cholangiocarcinoma, B12 defeciency, Alcoholic Hepatitis     Clinical Impressions PTA, pt lives alone, reports Modified Independence with ADLs and mobility using RW but unsure of further details due to AMS on eval. Pt presents now with deficits in cognition, standing balance, strength and endurance. Pt pleasant, requires cues for sequencing and problem solving (especially using phone to call family). Pt requiring Mod A x 2 to stand but once up, able to progress bathroom mobility to Min A. Pt requires Min A for UB ADL and up to Max A for LB ADLs d/t deficits. Based on current deficits, recommend continued inpatient follow up therapy, <3 hours/day at DC.      If plan is discharge home, recommend the following:   A lot of help with walking and/or transfers;A lot of help with bathing/dressing/bathroom;Direct supervision/assist for medications management;Direct supervision/assist for financial management;Assistance with cooking/housework;Assist for transportation;Supervision due to cognitive status     Functional Status Assessment   Patient has had a recent decline in their functional status and demonstrates the ability to make significant improvements in function in a reasonable and predictable amount of time.     Equipment Recommendations    Other (comment) (TBD pending progress)     Recommendations for Other Services         Precautions/Restrictions   Precautions Precautions: Fall Recall of Precautions/Restrictions: Impaired Precaution/Restrictions Comments: cortrak Restrictions Weight Bearing Restrictions Per Provider Order: No     Mobility Bed Mobility Overal bed mobility: Needs Assistance Bed Mobility: Supine to Sit     Supine to sit: Mod assist, +2 for physical assistance, +2 for safety/equipment, HOB elevated     General bed mobility comments: ModAx2 to complete moving LE's off EOB and raise trunk. ModA to scoot hips forward using bed pad    Transfers Overall transfer level: Needs assistance Equipment used: Rolling walker (2 wheels) Transfers: Sit to/from Stand Sit to Stand: Mod assist, +2 safety/equipment           General transfer comment: ModA for boost-up and steadying, cues for hand placement on RW after completing stand. x2 for safety      Balance Overall balance assessment: Needs assistance, Mild deficits observed, not formally tested Sitting-balance support: No upper extremity supported, Feet supported Sitting balance-Leahy Scale: Good     Standing balance support: Bilateral upper extremity supported, During functional activity, Reliant on assistive device for balance Standing balance-Leahy Scale: Poor Standing balance comment: reliant on RW                           ADL either performed or assessed with clinical judgement   ADL Overall ADL's : Needs assistance/impaired Eating/Feeding: NPO   Grooming: Set up;Sitting;Wash/dry face   Upper Body Bathing: Minimal assistance;Sitting   Lower Body Bathing: Moderate assistance;Sit to/from stand;Sitting/lateral leans   Upper Body Dressing : Set up;Sitting   Lower Body Dressing: Maximal assistance;Sit to/from stand;Sitting/lateral leans Lower Body  Dressing Details (indicate cue type and reason): prompted to attempt to  don socks with pt reporting "you do it" Toilet Transfer: Minimal assistance;Moderate assistance;+2 for safety/equipment;Rolling walker (2 wheels);Regular Teacher, adult education Details (indicate cue type and reason): Min A to manuever RW into bathroom. Mod A (x 2 helpful for safety) to stand from low toilet Toileting- Clothing Manipulation and Hygiene: Moderate assistance;Sitting/lateral lean;Sit to/from stand       Functional mobility during ADLs: Minimal assistance;+2 for safety/equipment;+2 for physical assistance;Rolling walker (2 wheels);Cueing for sequencing;Cueing for safety       Vision Ability to See in Adequate Light: 0 Adequate Patient Visual Report: No change from baseline Vision Assessment?: No apparent visual deficits     Perception         Praxis         Pertinent Vitals/Pain Pain Assessment Pain Assessment: No/denies pain     Extremity/Trunk Assessment Upper Extremity Assessment Upper Extremity Assessment: Overall WFL for tasks assessed;Right hand dominant   Lower Extremity Assessment Lower Extremity Assessment: Defer to PT evaluation   Cervical / Trunk Assessment Cervical / Trunk Assessment: Normal   Communication Communication Communication: Impaired Factors Affecting Communication: Reduced clarity of speech   Cognition Arousal: Alert Behavior During Therapy: WFL for tasks assessed/performed, Restless Cognition: Cognition impaired   Orientation impairments: Situation, Time Awareness: Online awareness impaired, Intellectual awareness impaired Memory impairment (select all impairments): Short-term memory, Working Civil [employer] fast streamer, Conservation officer, historic buildings, Non-declarative long-term memory Attention impairment (select first level of impairment): Sustained attention, Selective attention Executive functioning impairment (select all impairments): Organization, Sequencing, Reasoning, Problem solving OT - Cognition Comments: pleasant, folliwng one step commands  with multimodal cues. memory deficits noted as pt reporting daughter's name as "Lindy" but in chart as "[name]". significant issues problem solving and awareness when attempting to call son and daughter this AM. Cues for safety, pacing as pt with minor confusion coordinating bathroom needs                 Following commands: Impaired Following commands impaired: Only follows one step commands consistently, Follows one step commands with increased time     Cueing  General Comments   Cueing Techniques: Verbal cues;Tactile cues  BP pre activity: 123/86  BP standing at bedside: 86/73 BP post activity:  122/75   Exercises     Shoulder Instructions      Home Living Family/patient expects to be discharged to:: Private residence Living Arrangements: Alone Available Help at Discharge: Family Type of Home: House Home Access: Ramped entrance     Home Layout: One level     Bathroom Shower/Tub: Chief Strategy Officer: Handicapped height     Home Equipment: Grab bars - tub/shower;Grab bars - toilet;Rolling Walker (2 wheels);Rollator (4 wheels)          Prior Functioning/Environment Prior Level of Function : Independent/Modified Independent;Patient poor historian/Family not available             Mobility Comments: reports using rollator for mobility ADLs Comments: reports modified independence with ADLs, unsure of IADLs    OT Problem List: Decreased strength;Decreased activity tolerance;Impaired balance (sitting and/or standing);Decreased cognition;Decreased safety awareness;Decreased knowledge of use of DME or AE   OT Treatment/Interventions: Self-care/ADL training;Therapeutic exercise;Energy conservation;DME and/or AE instruction;Therapeutic activities;Patient/family education;Balance training      OT Goals(Current goals can be found in the care plan section)   Acute Rehab OT Goals Patient Stated Goal: call my son or daughter OT Goal Formulation: With  patient Time For Goal  Achievement: 02/26/24 Potential to Achieve Goals: Good   OT Frequency:  Min 2X/week    Co-evaluation PT/OT/SLP Co-Evaluation/Treatment: Yes Reason for Co-Treatment: For patient/therapist safety;To address functional/ADL transfers;Necessary to address cognition/behavior during functional activity PT goals addressed during session: Mobility/safety with mobility;Balance;Proper use of DME OT goals addressed during session: ADL's and self-care;Proper use of Adaptive equipment and DME      AM-PAC OT "6 Clicks" Daily Activity     Outcome Measure Help from another person eating meals?: Total Help from another person taking care of personal grooming?: A Little Help from another person toileting, which includes using toliet, bedpan, or urinal?: A Lot Help from another person bathing (including washing, rinsing, drying)?: A Lot Help from another person to put on and taking off regular upper body clothing?: A Little Help from another person to put on and taking off regular lower body clothing?: A Lot 6 Click Score: 13   End of Session Equipment Utilized During Treatment: Gait belt;Rolling walker (2 wheels) Nurse Communication: Mobility status  Activity Tolerance: Patient tolerated treatment well Patient left: in bed;with call bell/phone within reach;with bed alarm set  OT Visit Diagnosis: Other abnormalities of gait and mobility (R26.89);Muscle weakness (generalized) (M62.81);Other symptoms and signs involving cognitive function                Time: 2130-8657 OT Time Calculation (min): 28 min Charges:  OT General Charges $OT Visit: 1 Visit OT Evaluation $OT Eval Moderate Complexity: 1 Mod  Kyle Montoya, Kyle Montoya Acute Rehab Services Office: [phone]   Kyle Montoya 02/12/2024, 10:05 AM

## 2024-02-12 NOTE — Progress Notes (Signed)
 PROGRESS NOTE    Kyle Montoya  UEA:[id number] DOB: 10-26-1950 DOA: 02/05/2024 PCP: Teofilo Fellers, NP  73/M w diastolic CHF, COPD, EtOH abuse, history of multiple cancers namely lung cancer sp resection and cholangiocarcinoma presented to the ED with generalized weakness and lower extremity edema. In the ER he was hypotensive with lower extremity edema, labs noted hyponatremia, creatinine 1.5, mildly elevated LFTs, BNP 149, troponin 56, lactic acid 1.9, chest x-ray with pulmonary vascular congestion, pleural effusion -Fluid resuscitated, also treated with antibiotics and midodrine   - 5/16 developed EtOH withdrawal, started on phenobarbital , continued vasopressor support  - 5/17 weaned off vasopressors  -5/18 transfer to [facility].  -5/19 clinically improving, delirium, required mittens -5/20 noted to have dysphagia and not able to have enteral po nutrition. Ordered cortrak - 5/21: Cortrak placed, tube feeds started, delirious   Subjective: - Some agitation and delirium overnight, tolerating tube feeds, no BM yet  Assessment and Plan:  Septic shock, resolved Right lower lobe/aspiration pneumonia -Completed antibiotic course - Dysphagia, see below  Severe dysphagia - On swallowing evaluation he was noted to have large flowing anterior osteophytes C2-C5 producing obstruction to his swallowing.  - Despite being awake, continued to do poorly with swallowing assessment, discussed with daughter, core track placed yesterday, now on tube feeds, will request palliative care evaluation on account of ongoing issues with delirium, numerous comorbidities etc. and facilitate further discussion regarding PEG tube  Acute on chronic diastolic CHF (congestive heart failure) (HCC) Echocardiogram with preserved LV systolic function EF 65 to 70%, RV systolic function preserved, no significant valvular disease.  -Continue blood pressure support with midodrine . - Diuretics on hold now, appears  euvolemic  Iron deficiency anemia - Will add IV iron therapy, trend hemoglobin, - Is not in clinical condition that is appropriate for further evaluation at this time, recommend GI follow-up  Alcohol withdrawal (HCC) -Now with improvement, now off phenobarbital  however he Continue neuro checks Continue thiamine  and multivitamins   Delirium, agitation - Day 8 of hospitalization, do not anticipate ongoing EtOH withdrawal at this time - Add Seroquel every afternoon, Haldol as needed  HTN (hypertension) Continue midodrine  for blood pressure control.    AKI (acute kidney injury) (HCC) Hyponatremia. hypokalemia - Resolved  CAD (coronary artery disease) No chest pain, no acute coronary syndrome   COPD with asthma (HCC) Stable Continue with bronchodilator therapy   Type 2 diabetes mellitus with hyperlipidemia (HCC) Will hold on insulin  therapy to avoid hypoglycemia.  Fasting glucose 97 mg/dl today   Ascending aortic aneurysm (HCC) Follow up as outpatient   Carcinoma of biliary tract (HCC) Follow up as outpatient.     DVT prophylaxis: SCDs Code Status: Full Code Family Communication: none present, called and updated daughter Disposition Plan: TBD  Consultants:    Procedures:   Antimicrobials:    Objective: Vitals:   02/12/24 0300 02/12/24 0400 02/12/24 0702 02/12/24 0807  BP: (!) 140/82 122/78  123/86  Pulse: 90 78  80  Resp: 20 16  18   Temp:  (!) 97.5 F (36.4 C)  97.9 F (36.6 C)  TempSrc:  Oral  Oral  SpO2: 95% 97%  98%  Weight:   83.9 kg   Height:        Intake/Output Summary (Last 24 hours) at 02/12/2024 1021 Last data filed at 02/12/2024 4782 Gross per 24 hour  Intake 847.42 ml  Output 880 ml  Net -32.58 ml   Filed Weights   02/09/24 0543 02/11/24 0400 02/12/24 0702  Weight: 86.2  kg 86.6 kg 83.9 kg    Examination:  General exam: Chronically ill elderly male laying in bed, AO x 2, cognitive deficits noted HEENT: No JVD, cortrak  noted Respiratory system: Scattered rhonchi, decreased breath sounds the bases Cardiovascular system: S1-S2, regular rhythm Abd: nondistended, soft and nontender.Normal bowel sounds heard. Extremities: no edema Skin: No rashes Psychiatry: Flat affect    Data Reviewed:   CBC: Recent Labs  Lab 02/05/24 1524 02/05/24 1554 02/07/24 0340 02/07/24 2148 02/08/24 0233 02/08/24 1212 02/09/24 0230 02/11/24 0249 02/12/24 0235  WBC 6.9   < > 6.5  --  6.2  --  4.1 3.2* 3.8*  NEUTROABS 5.0  --   --   --   --   --   --   --   --   HGB 13.6   < > 11.1*   < > 10.4* 9.0* 8.5* 7.5* 7.4*  HCT 38.0*   < > 31.0*   < > 28.5* 25.6* 25.4* 22.1* 21.5*  MCV 98.4   < > 100.3*  --  99.3  --  105.0* 103.8* 105.4*  PLT 286   < > 226  --  256  --  190 208 239   < > = values in this interval not displayed.   Basic Metabolic Panel: Recent Labs  Lab 02/05/24 1911 02/06/24 0344 02/06/24 1741 02/07/24 0740 02/08/24 0233 02/09/24 0230 02/10/24 0315 02/11/24 0249 02/12/24 0235  NA  --  125*   < > 128* 129* 133* 134* 137 138  K  --  2.9*   < > 4.0 4.5 4.3 4.1 3.6 3.9  CL  --  90*   < > 94* 96* 102 102 105 108  CO2  --  23   < > 23 23 19* 21* 23 22  GLUCOSE  --  105*   < > 93 127* 99 97 76 119*  BUN  --  24*   < > 13 19 18 13 9 11   CREATININE  --  1.20   < > 0.64 0.81 0.82 0.80 0.68 0.84  CALCIUM  --  7.3*   < > 7.7* 8.1* 8.3* 8.3* 8.1* 8.3*  MG 1.9 1.8  --  1.7  --   --  1.7  --  1.8  PHOS  --  4.3  --  2.3*  --   --   --   --  2.8   < > = values in this interval not displayed.   GFR: Estimated Creatinine Clearance: 84.7 mL/min (by C-G formula based on SCr of 0.84 mg/dL). Liver Function Tests: Recent Labs  Lab 02/05/24 1524 02/06/24 0344  AST 61* 54*  ALT 26 23  ALKPHOS 69 61  BILITOT 0.7 0.7  PROT 5.7* 5.2*  ALBUMIN  2.4* 2.4*   No results for input(s): "LIPASE", "AMYLASE" in the last 168 hours. Recent Labs  Lab 02/11/24 0830  AMMONIA 19   Coagulation Profile: Recent Labs  Lab  02/06/24 0344  INR 1.1   Cardiac Enzymes: No results for input(s): "CKTOTAL", "CKMB", "CKMBINDEX", "TROPONINI" in the last 168 hours. BNP (last 3 results) No results for input(s): "PROBNP" in the last 8760 hours. HbA1C: No results for input(s): "HGBA1C" in the last 72 hours. CBG: Recent Labs  Lab 02/11/24 0552 02/11/24 2143 02/12/24 0039 02/12/24 0430 02/12/24 0806  GLUCAP 77 93 108* 126* 135*   Lipid Profile: No results for input(s): "CHOL", "HDL", "LDLCALC", "TRIG", "CHOLHDL", "LDLDIRECT" in the last 72 hours. Thyroid Function Tests:  No results for input(s): "TSH", "T4TOTAL", "FREET4", "T3FREE", "THYROIDAB" in the last 72 hours. Anemia Panel: Recent Labs    02/11/24 0830  VITAMINB12 889  FOLATE 8.5  FERRITIN 284  TIBC 248*  IRON 32*  RETICCTPCT 2.9   Urine analysis:    Component Value Date/Time   COLORURINE YELLOW 02/05/2024 1826   APPEARANCEUR CLEAR 02/05/2024 1826   LABSPEC 1.005 02/05/2024 1826   PHURINE 6.0 02/05/2024 1826   GLUCOSEU NEGATIVE 02/05/2024 1826   HGBUR NEGATIVE 02/05/2024 1826   BILIRUBINUR NEGATIVE 02/05/2024 1826   KETONESUR NEGATIVE 02/05/2024 1826   PROTEINUR NEGATIVE 02/05/2024 1826   NITRITE NEGATIVE 02/05/2024 1826   LEUKOCYTESUR NEGATIVE 02/05/2024 1826   Sepsis Labs: @LABRCNTIP (procalcitonin:4,lacticidven:4)  ) Recent Results (from the past 240 hours)  Culture, blood (routine x 2)     Status: None   Collection Time: 02/05/24  4:52 PM   Specimen: BLOOD  Result Value Ref Range Status   Specimen Description BLOOD SITE NOT SPECIFIED  Final   Special Requests   Final    BOTTLES DRAWN AEROBIC AND ANAEROBIC Blood Culture results may not be optimal due to an inadequate volume of blood received in culture bottles   Culture   Final    NO GROWTH 5 DAYS Performed at [facility], [address] [place], [place] [address]    Report Status 02/10/2024 FINAL  Final  MRSA Next Gen by PCR, Nasal     Status: None   Collection Time:  02/06/24  2:39 AM   Specimen: Nasal Mucosa; Nasal Swab  Result Value Ref Range Status   MRSA by PCR Next Gen NOT DETECTED NOT DETECTED Final    Comment: (NOTE) The GeneXpert MRSA Assay (FDA approved for NASAL specimens only), is one component of a comprehensive MRSA colonization surveillance program. It is not intended to diagnose MRSA infection nor to guide or monitor treatment for MRSA infections. Test performance is not FDA approved in patients less than 63 years old. Performed at [facility], [address], [place], [place] [address]   Culture, blood (routine x 2)     Status: None   Collection Time: 02/06/24  3:44 AM   Specimen: BLOOD RIGHT ARM  Result Value Ref Range Status   Specimen Description BLOOD RIGHT ARM  Final   Special Requests   Final    BOTTLES DRAWN AEROBIC AND ANAEROBIC Blood Culture adequate volume   Culture   Final    NO GROWTH 5 DAYS Performed at [facility], [address], [place], [place] [address]    Report Status 02/11/2024 FINAL  Final     Radiology Studies: DG Abd Portable 1V Result Date: 02/11/2024 CLINICAL DATA:  Feeding tube placement. EXAM: PORTABLE ABDOMEN - 1 VIEW COMPARISON:  None Available. FINDINGS: Tip of the weighted enteric tube below the diaphragm in the midline in the region of the distal stomach. No bowel dilatation to suggest obstruction. There is barium in the colon from recent barium swallow. IMPRESSION: Tip of the weighted enteric tube below the diaphragm in the region of the distal stomach. Electronically Signed   By: Chadwick Colonel M.D.   On: 02/11/2024 15:23   ECHOCARDIOGRAM LIMITED Result Date: 02/10/2024    ECHOCARDIOGRAM LIMITED REPORT   Patient Name:   Kyle Montoya Date of Exam: 02/10/2024 Medical Rec #:  [id number]      Height:       72.0 in Accession #:    [id number]  Weight:       190.0 lb Date of Birth:  [birth year]      BSA:          2.085 m Patient Age:    73 years       BP:           119/80 mmHg Patient  Gender: M              HR:           77 bpm. Exam Location:  Inpatient Procedure: Limited Echo and Limited Color Doppler (Both Spectral and Color Flow            Doppler were utilized during procedure). Indications:    Dyspnea R06.00  History:        Patient has prior history of Echocardiogram examinations, most                 recent 12/02/2023. CHF, CAD, PAD and COPD,                 Signs/Symptoms:Hypotension; Risk Factors:Hypertension, Diabetes                 and Dyslipidemia.  Sonographer:    Terrilee Few RCS Referring Phys: ARRIEN, MAURICIO, DANIEL IMPRESSIONS  1. Left ventricular ejection fraction, by estimation, is 65 to 70%. The left ventricle has normal function. The left ventricle has no regional wall motion abnormalities. Left ventricular diastolic parameters were normal.  2. Right ventricular systolic function is normal. The right ventricular size is normal. Tricuspid regurgitation signal is inadequate for assessing PA pressure.  3. No evidence of mitral valve regurgitation. No evidence of mitral stenosis.  4. The aortic valve is tricuspid. There is mild calcification of the aortic valve. There is mild thickening of the aortic valve. Aortic valve regurgitation is moderate. Aortic valve sclerosis/calcification is present, without any evidence of aortic stenosis.  5. Aortic dilatation noted. There is mild dilatation of the aortic root, measuring 41 mm. There is mild dilatation of the ascending aorta, measuring 42 mm. FINDINGS  Left Ventricle: Left ventricular ejection fraction, by estimation, is 65 to 70%. The left ventricle has normal function. The left ventricle has no regional wall motion abnormalities. The left ventricular internal cavity size was normal in size. There is  no left ventricular hypertrophy. Left ventricular diastolic parameters were normal. Normal left ventricular filling pressure. Right Ventricle: The right ventricular size is normal. Right ventricular systolic function is normal.  Tricuspid regurgitation signal is inadequate for assessing PA pressure. Pericardium: There is no evidence of pericardial effusion. Mitral Valve: There is mild thickening of the anterior mitral valve leaflet(s). There is mild calcification of the anterior mitral valve leaflet(s). No evidence of mitral valve stenosis. Aortic Valve: The aortic valve is tricuspid. There is mild calcification of the aortic valve. There is mild thickening of the aortic valve. Aortic valve regurgitation is moderate. Aortic valve sclerosis/calcification is present, without any evidence of aortic stenosis. Aortic valve peak gradient measures 7.5 mmHg. Pulmonic Valve: The pulmonic valve was grossly normal. Pulmonic valve regurgitation is not visualized. No evidence of pulmonic stenosis. Aorta: Aortic dilatation noted. There is mild dilatation of the aortic root, measuring 41 mm. There is mild dilatation of the ascending aorta, measuring 42 mm. Venous: The inferior vena cava was not well visualized. LEFT VENTRICLE PLAX 2D LVIDd:         4.80 cm   Diastology LVIDs:         2.90 cm  LV e' medial:    7.72 cm/s LV PW:         1.10 cm   LV E/e' medial:  9.5 LV IVS:        0.80 cm   LV e' lateral:   9.57 cm/s LVOT diam:     2.40 cm   LV E/e' lateral: 7.7 LV SV:         96 LV SV Index:   46 LVOT Area:     4.52 cm  RIGHT VENTRICLE RV S prime:     15.00 cm/s TAPSE (M-mode): 1.6 cm LEFT ATRIUM         Index LA diam:    3.60 cm 1.73 cm/m  AORTIC VALVE AV Area (Vmax): 3.40 cm AV Vmax:        137.00 cm/s AV Peak Grad:   7.5 mmHg LVOT Vmax:      103.00 cm/s LVOT Vmean:     64.500 cm/s LVOT VTI:       0.212 m  AORTA Ao Root diam: 4.10 cm Ao Asc diam:  4.20 cm MITRAL VALVE MV Area (PHT): 4.60 cm    SHUNTS MV Decel Time: 165 msec    Systemic VTI:  0.21 m MV E velocity: 73.40 cm/s  Systemic Diam: 2.40 cm MV A velocity: 68.10 cm/s MV E/A ratio:  1.08 Mihai Croitoru MD Electronically signed by Luana Rumple MD Signature Date/Time: 02/10/2024/5:14:47 PM     Final      Scheduled Meds:  Chlorhexidine  Gluconate Cloth  6 each Topical Daily   famotidine  40 mg Per Tube BID   feeding supplement (PROSource TF20)  60 mL Per Tube Daily   folic acid   1 mg Per Tube Daily   free water  200 mL Per Tube Q6H   midodrine   10 mg Per Tube TID WC   multivitamin with minerals  1 tablet Per Tube Daily   QUEtiapine  25 mg Oral QHS   thiamine   100 mg Per Tube Q24H   Continuous Infusions:  feeding supplement (OSMOLITE 1.5 CAL) 25 mL/hr at 02/12/24 0800   iron sucrose Stopped (02/11/24 1346)   thiamine  (VITAMIN B1) injection       LOS: 7 days    Time spent:    Deforest Fast, MD [facility]   02/12/2024, 10:21 AM

## 2024-02-12 NOTE — Progress Notes (Signed)
 Pt is getting fidgety, pulled the tele leads off and holding his cor-track.  Pt able to redirect and follows command but he  forgets about the Lines. Mittens was placed then pt started to get agitated, mittens was taken Off eventually after 30 mins on. Constant reminding pt and frequent  rounding ongoing.  Pt still unable to urinate on his on. IN and out done for the second time.  Care ongoing.

## 2024-02-12 NOTE — TOC CM/SW Note (Addendum)
 Transition of Care [facility]) - Inpatient Brief Assessment   Patient Details  Name: Kyle Montoya MRN: [id number] Date of Birth: October 30, 1950  [facility] CM/SW Contact:    Juliane Och, LCSW Phone Number: 02/12/2024, 12:19 PM   Clinical Narrative:  12:19 PM Per progressions, patient is not yet fully oriented. Physical therapy recommended patient to discharge to SNF. CSW attempted to call patient's daughter, Maida Sciara, to conduct TOC initial assessment and follow up on SNF, but there was no response and a voicemail was left.  3:13 PM Per progressions, patient is not yet fully oriented. Physical therapy recommended patient to discharge to SNF. CSW attempted to call patient's daughter, Maida Sciara, to conduct TOC initial assessment and follow up on SNF, but there was no response and a voicemail was left.  Transition of Care Asessment: Insurance and Status: Insurance coverage has been reviewed Patient has primary care physician: Yes Home environment has been reviewed: Private Residence Prior level of function:: Independent/Modified Audiological scientist Home Services: No current home services (Has HH/DME History) Social Drivers of Health Review: SDOH reviewed no interventions necessary Readmission risk has been reviewed: Yes Transition of care needs: transition of care needs identified, TOC will continue to follow

## 2024-02-13 DIAGNOSIS — N179 Acute kidney failure, unspecified: Secondary | ICD-10-CM | POA: Diagnosis not present

## 2024-02-13 DIAGNOSIS — Z515 Encounter for palliative care: Secondary | ICD-10-CM | POA: Diagnosis not present

## 2024-02-13 DIAGNOSIS — Z7189 Other specified counseling: Secondary | ICD-10-CM | POA: Diagnosis not present

## 2024-02-13 DIAGNOSIS — J189 Pneumonia, unspecified organism: Secondary | ICD-10-CM | POA: Diagnosis not present

## 2024-02-13 DIAGNOSIS — I509 Heart failure, unspecified: Secondary | ICD-10-CM

## 2024-02-13 DIAGNOSIS — I959 Hypotension, unspecified: Secondary | ICD-10-CM

## 2024-02-13 LAB — PHOSPHORUS: Phosphorus: 2.1 mg/dL — ABNORMAL LOW (ref 2.5–4.6)

## 2024-02-13 LAB — GLUCOSE, CAPILLARY
Glucose-Capillary: 117 mg/dL — ABNORMAL HIGH (ref 70–99)
Glucose-Capillary: 104 mg/dL — ABNORMAL HIGH (ref 70–99)
Glucose-Capillary: 104 mg/dL — ABNORMAL HIGH (ref 70–99)
Glucose-Capillary: 93 mg/dL (ref 70–99)
Glucose-Capillary: 103 mg/dL — ABNORMAL HIGH (ref 70–99)

## 2024-02-13 LAB — CBC
HCT: 21.8 % — ABNORMAL LOW (ref 39.0–52.0)
Hemoglobin: 7.3 g/dL — ABNORMAL LOW (ref 13.0–17.0)
MCH: 36 pg — ABNORMAL HIGH (ref 26.0–34.0)
MCHC: 33.5 g/dL (ref 30.0–36.0)
MCV: 107.4 fL — ABNORMAL HIGH (ref 80.0–100.0)
Platelets: 247 10*3/uL (ref 150–400)
RBC: 2.03 MIL/uL — ABNORMAL LOW (ref 4.22–5.81)
RDW: 16.6 % — ABNORMAL HIGH (ref 11.5–15.5)
WBC: 3.9 10*3/uL — ABNORMAL LOW (ref 4.0–10.5)
nRBC: 2.8 % — ABNORMAL HIGH (ref 0.0–0.2)

## 2024-02-13 LAB — MAGNESIUM: Magnesium: 1.9 mg/dL (ref 1.7–2.4)

## 2024-02-13 LAB — BASIC METABOLIC PANEL WITH GFR
Anion gap: 5 (ref 5–15)
BUN: 10 mg/dL (ref 8–23)
CO2: 24 mmol/L (ref 22–32)
Calcium: 8 mg/dL — ABNORMAL LOW (ref 8.9–10.3)
Chloride: 109 mmol/L (ref 98–111)
Creatinine, Ser: 0.66 mg/dL (ref 0.61–1.24)
GFR, Estimated: >60 mL/min (ref >60)
Glucose, Bld: 110 mg/dL — ABNORMAL HIGH (ref 70–99)
Potassium: 3.6 mmol/L (ref 3.5–5.1)
Sodium: 138 mmol/L (ref 135–145)

## 2024-02-13 NOTE — Plan of Care (Signed)
  Problem: Metabolic: Goal: Ability to maintain appropriate glucose levels will improve Outcome: Progressing   Problem: Education: Goal: Knowledge of General Education information will improve Description: Including pain rating scale, medication(s)/side effects and non-pharmacologic comfort measures Outcome: Progressing   Problem: Activity: Goal: Risk for activity intolerance will decrease Outcome: Progressing

## 2024-02-13 NOTE — Progress Notes (Signed)
 Nutrition Brief Note   Pt now up to goal rate and tolerating. Abdomen soft, non-distended. No edema noted. No reported N/V/C/D. No BM x5 days. Interventions in place. Monitoring for refeeding. Labs stable. Supplements ordered.  Patient will likely need PEG at some point. Discussions ongoing. Therapy recommending SNF at discharge.   INTERVENTION:  When appropriate, initiate tube feeding via Cortrak: Osmolite 1.5 at 55 ml/h (1320 ml per day) Prosource TF20 60 ml daily FWF q6 hours ( per day)   Provides 2060 kcal, 103 gm protein, 1806 ml free water daily (TF + FWF)    Continue thiamine /folic acid  r/t EtOH use Monitor magnesium , potassium, and phosphorus BID for at least 3 days, MD to replete as needed Add MVI w/ minerals   NUTRITION DIAGNOSIS:  Moderate Malnutrition related to social / environmental circumstances (EtOH abuse and loss of his wife) as evidenced by percent weight loss, mild fat depletion, moderate muscle depletion. - remains applicable    GOAL:  Patient will meet greater than or equal to 90% of their needs - progressing; will be met with TFs running at goal rate   MONITOR:  TF tolerance, Diet advancement   Con Decant MS, RD, LDN Registered Dietitian Clinical Nutrition RD Inpatient Contact Info in Amion

## 2024-02-13 NOTE — [place] FL2 (Signed)
 [place]  MEDICAID FL2 LEVEL OF CARE FORM     IDENTIFICATION  Patient Name: Kyle Montoya Birthdate: [birth year] Sex: male Admission Date (Current Location): 02/05/2024  [facility] and [place] Number:  [employer] and Address:  [facility], [address] [place], [place] [address]      Provider Number: [id number]  Attending Physician Name and Address:  Deforest Fast, MD  Relative Name and Phone Number:  Duane Earnshaw; Daughter; [phone]    Current Level of Care: Hospital Recommended Level of Care: Skilled Nursing Facility Prior Approval Number:    Date Approved/Denied:   PASRR Number: [id number]  Discharge Plan: SNF    Current Diagnoses: Patient Active Problem List   Diagnosis Date Noted   Malnutrition of moderate degree 02/11/2024   Acute on chronic diastolic CHF (congestive heart failure) (HCC) 02/08/2024   Alcohol withdrawal (HCC) 02/08/2024   Right lower lobe pneumonia 02/05/2024   AKI (acute kidney injury) (HCC) 12/02/2023   Hypotension 12/02/2023   Alcohol abuse 12/02/2023   Hypokalemia 12/02/2023   Metabolic acidosis 12/02/2023   Shock (HCC) 12/02/2023   Orthostatic hypotension 10/10/2023   Acute diastolic (congestive) heart failure (HCC) 10/08/2023   Congestive heart failure (CHF) (HCC) 09/26/2023   BPH (benign prostatic hyperplasia)    CAD (coronary artery disease)    Chronic myelogenous leukemia (HCC)    COPD (chronic obstructive pulmonary disease) (HCC)    DJD (degenerative joint disease)    Type 2 diabetes mellitus with hyperlipidemia (HCC)    Family history of colonic polyps    HTN (hypertension)    Hypercholesterolemia    Lung cancer (HCC)    Myelogenous leukemia (HCC)    OA (osteoarthritis)    PAD (peripheral artery disease) (HCC)    Renal insufficiency    Tendonitis    Hyponatremia 09/19/2023   High coronary artery calcium score 05/30/2023   Diuretic-induced hypokalemia 05/23/2023   Ascending  aortic aneurysm (HCC) 06/27/2022   Thoracic aortic aneurysm (HCC) 06/27/2022   Abnormal transaminases 02/27/2022   History of colonic polyps 10/01/2021   Family history of prostate cancer 10/01/2021   Hypocalcemia 08/27/2021   Adenomatous polyp of descending colon 02/23/2021   History of colon polyps 02/23/2021   Abnormal colonoscopy 02/23/2021   Personal history of gastric ulcers 02/23/2021   Hypercalcemia 08/10/2020   Full thickness rotator cuff tear 08/08/2020   Impingement syndrome of both shoulders 03/31/2020   Shoulder pain, right 03/29/2020   Shoulder pain, left 03/29/2020   Adverse drug reaction, initial encounter 07/04/2016   Adult BMI 30+ 01/31/2015   Benign fibroma of prostate 01/31/2015   Cataract 01/31/2015   Chronic cough 01/31/2015   Chronic pain 01/31/2015   Arthritis, degenerative 01/31/2015   HLD (hyperlipidemia) 01/31/2015   Cannot sleep 01/31/2015   Non-small cell carcinoma of lung (HCC) 01/31/2015   Compulsive tobacco user syndrome 01/31/2015   Wheezing 01/31/2015   CML (chronic myelocytic leukemia) (HCC) 10/18/2014   Gout 10/18/2014   Cervical osteoarthritis 10/18/2014   Carcinoma of biliary tract (HCC) 02/15/2013   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   Anemia in chronic illness 01/22/2013   COPD with asthma (HCC) 01/22/2013   BP (high blood pressure) 01/22/2013    Orientation RESPIRATION BLADDER Height & Weight     Self, Place  Normal (Room Air) Continent, External catheter Weight: 186 lb 4.6 oz (84.5 kg) Height:  6' (182.9 cm)  BEHAVIORAL SYMPTOMS/MOOD NEUROLOGICAL BOWEL NUTRITION STATUS      Continent Diet (Please  see discharge summary)  AMBULATORY STATUS COMMUNICATION OF NEEDS Skin   Extensive Assist Verbally Other (Comment) (Wound / Incision (Open or Dehisced) 02/06/24 Skin tear Hand Posterior;Right Skin Tear)                       Personal Care Assistance Level of Assistance  Bathing, Feeding, Dressing Bathing  Assistance: Maximum assistance Feeding assistance: Maximum assistance Dressing Assistance: Maximum assistance     Functional Limitations Info  Sight Sight Info: Impaired (R and L (Eyeglasses))        SPECIAL CARE FACTORS FREQUENCY  PT (By licensed PT), OT (By licensed OT)     PT Frequency: 5x OT Frequency: 5x            Contractures Contractures Info: Not present    Additional Factors Info  Code Status, Allergies, Insulin  Sliding Scale Code Status Info: Full Code Allergies Info: Pneumococcal Vaccines   Insulin  Sliding Scale Info: Please see discharge summary       Current Medications (02/13/2024):  This is the current hospital active medication list Current Facility-Administered Medications  Medication Dose Route Frequency Provider Last Rate Last Admin   Chlorhexidine  Gluconate Cloth 2 % PADS 6 each  6 each Topical Daily Claven Cumming, MD   6 each at 02/10/24 1337   famotidine (PEPCID) tablet 40 mg  40 mg Per Tube BID Sundil, Subrina, MD   40 mg at 02/13/24 0851   feeding supplement (OSMOLITE 1.5 CAL) liquid 1,000 mL  1,000 mL Per Tube Continuous Deforest Fast, MD 55 mL/hr at 02/13/24 0232 1,000 mL at 02/13/24 0232   feeding supplement (PROSource TF20) liquid 60 mL  60 mL Per Tube Daily Deforest Fast, MD   60 mL at 02/13/24 0850   folic acid  (FOLVITE ) tablet 1 mg  1 mg Per Tube Daily Sundil, Subrina, MD   1 mg at 02/13/24 0850   free water 200 mL  200 mL Per Tube Q6H Deforest Fast, MD   200 mL at 02/13/24 0539   haloperidol lactate (HALDOL) injection 1 mg  1 mg Intravenous Q6H PRN Joseph, Preetha, MD       ipratropium-albuterol  (DUONEB) 0.5-2.5 (3) MG/3ML nebulizer solution 3 mL  3 mL Nebulization Q4H PRN Arrien, Curlee Doss, MD   3 mL at 02/12/24 1457   iron sucrose (VENOFER) 200 mg in sodium chloride  0.9 % 100 mL IVPB  200 mg Intravenous Q24H Deforest Fast, MD 440 mL/hr at 02/12/24 1638 200 mg at 02/12/24 1638   lactulose (CHRONULAC) 10 GM/15ML solution 20 g   20 g Per Tube BID Deforest Fast, MD   20 g at 02/13/24 7829   midodrine  (PROAMATINE ) tablet 10 mg  10 mg Per Tube TID WC Sundil, Subrina, MD   10 mg at 02/13/24 5621   multivitamin with minerals tablet 1 tablet  1 tablet Per Tube Daily Sundil, Subrina, MD   1 tablet at 02/13/24 3086   ondansetron  (ZOFRAN ) injection 4 mg  4 mg Intravenous Q6H PRN Claven Cumming, MD       Oral care mouth rinse  15 mL Mouth Rinse PRN Claven Cumming, MD       polyethylene glycol (MIRALAX  / GLYCOLAX ) packet 17 g  17 g Oral Daily PRN Claven Cumming, MD       QUEtiapine (SEROQUEL) tablet 25 mg  25 mg Oral QHS Joseph, Preetha, MD   25 mg at 02/12/24 2158   senna-docusate (Senokot-S) tablet 1 tablet  1 tablet Per Tube  BID Joseph, Preetha, MD   1 tablet at 02/13/24 1610   thiamine  (VITAMIN B1) tablet 100 mg  100 mg Per Tube Q24H Sundil, Subrina, MD   100 mg at 02/13/24 9604     Discharge Medications: Please see discharge summary for a list of discharge medications.  Relevant Imaging Results:  Relevant Lab Results:   Additional Information SS#[id number]  Sheleen Conchas E Mariely Mahr, LCSW

## 2024-02-13 NOTE — Consult Note (Signed)
 Palliative Care Consult Note                                  Date: 02/13/2024   Patient Name: Kyle Montoya  DOB: Sep 07, 1951  MRN: [id number]  Age / Sex: 73 y.o., male  PCP: Kyle Fellers, NP Referring Physician: Deforest Fast, MD  Reason for Consultation: Establishing goals of care  HPI/Patient Profile: 73 y.o. male  with past medical history of chronic diastolic CHF, COPD, alcohol use disorder, and chronic myelogenous leukemia on Dasatinib . He also has a history history of stage Ia non-small lung cell cancer (2016) and stage Ia cholangiocarcinoma (2014), both treated with surgery alone and both with no evidence of disease recurrence on scans in October 2024.  He presented to the ED on 02/05/2024 with generalized weakness and lower extremity edema.  He was found to be hypotensive and was admitted with septic shock secondary to right lower lobe aspiration pneumonia.    Hospitalization has been complicated by alcohol withdrawal requiring phenobarbital  as well as severe dysphagia secondary to C2-C5 osteophytes.  Palliative Medicine has been consulted for  goals of care.    Subjective:   Extensive chart review has been completed prior to meeting with patient/family including labs, vital signs, imaging, progress/consult notes, orders, medications and available advance directive documents.    Update received from RN. Patient assessed at bedside. He is oriented to self, place, and able to communicate his basic needs. He is able to tell me he is in the hospital due to "pneumonia" and a "problem with his neck". He seems to have limited insight into his current medical condition.   I spoke with his daughter/Kyle Montoya by phone to discuss diagnosis, prognosis, GOC, disposition, and options.  I introduced Palliative Medicine as specialized medical care for people living with serious illness. It focuses on providing relief from the symptoms and stress  of a serious illness.   Created space and opportunity for daughter to express thoughts and feelings regarding current medical situation. Values and goals of care were attempted to be elicited.  Life Review: Patient has 1 son and 1 daughter. His wife passed away in December 05, 2022. Daughter reports his "drinking has progressed" since then. Up until about 7-8 months ago, he was still working as a Designer, television/film set.   Functional Status: Prior to admission, patient lived at home alone. He was independent and still driving. Daughter checked on him often.   GOC Discussion: We discussed patient's current illness and what it means in the larger context of his ongoing co-morbidities. Current clinical status was reviewed. Natural disease trajectory of chronic illness was discussed.  We discussed patient's multiple acute and chronic issues including CHF, pneumonia, septic shock (resolved), dysphagia, AKI (resolved), COPD, and delirium.   Discussed that patient will not likely return to his previous baseline. Discussed that he has severe dysphagia due to osteophytes. Offered education that aspiration pneumonia will be a recurrent issue.   Discussed benefits versus burdens of artificial feeding. Discussed that if the goal is rehab and to allow time/opportunity for potential improvement, patient would likely need a PEG.    Discussed the "what ifs" and encouraged daughter to consider what medical interventions patient would or would not want in the event his condition were to deteriorate, keeping in mind the concept of quality of life.    Discussed code status. I encouraged consideration of DNR/DNI status and provided education on  evidence-based poor outcomes in similar hospitalized patients, as the cause of cardiac arrest would likely be associated with advanced illness rather than a reversible condition. Daughter reports that patient himself recently told his PCP he wanted DNR status, and she agrees this is  appropriate.   Discussed the importance of continued conversation with family and the medical team regarding overall plan of care.    Review of Systems  Neurological:  Positive for weakness.    Objective:   Primary Diagnoses: Present on Admission:  Right lower lobe pneumonia  COPD with asthma (HCC)  Carcinoma of biliary tract (HCC)  Ascending aortic aneurysm (HCC)  CAD (coronary artery disease)  Type 2 diabetes mellitus with hyperlipidemia (HCC)  HTN (hypertension)  AKI (acute kidney injury) (HCC)   Physical Exam Vitals reviewed.  Constitutional:      General: He is not in acute distress.    Comments: Chronically ill-appearing  HENT:     Head:     Comments: Cortrak in place Cardiovascular:     Rate and Rhythm: Normal rate.  Pulmonary:     Effort: No respiratory distress.  Neurological:     Mental Status: He is alert.     Comments: Oriented to self and hospital     Palliative Assessment/Data: PPS 40%     Assessment & Plan:   SUMMARY OF RECOMMENDATIONS   Code status changed to DNR - Limited Continue current supportive interventions Plan for additional GOC discussions with daughter and son over the weekend  Primary Decision Maker: NEXT OF KIN - daughter and son  Symptom Management:  Per attending  Prognosis:  Unable to determine  Discharge Planning:  To Be Determined    Thank you for allowing us  to participate in the care of Kyle Montoya   Time Total: 76 minutes  Detailed review of medical records (labs, imaging, vital signs), medically appropriate exam, discussed with treatment team, counseling and education to patient, family, & staff, documenting clinical information, coordination of care.  Signed by: Maisie Scotland, NP Palliative Medicine Team  Team Phone # [phone]  For individual providers, please see AMION

## 2024-02-13 NOTE — Discharge Instructions (Signed)
 [employer] Guide Inpatient Behavioral Health/Residential Substance Abuse Treatment - Adults The [employer] Way's "B3979455" is a great source of information about community services available.  Access by dialing 2-1-1 from anywhere in [place], or by website -  PooledIncome.pl.    (Updated 12/2015)   Crisis Assistance 24 hours a day     Services Offered       Area [employer] 24-hour crisis assistance: [phone] [place], [place]  [facility] 24-hour crisis [phone])360-8344 [place], [place]  [facility] 24-hour crisis assistance: ([phone] [place], [place]  [place] Access to Care Line 24-hour crisis assistance; 9346624371 All  [facility] 24-hour crisis response line: ([phone] All    Other Local Resources (Updated 09/2015)   Inpatient Behavioral Health/Residential Substance Abuse Treatment Programs     Services          Address and Phone Number  [facility] ([facility])   14-day residential rehabilitation  [phone] 100 [address] [place], [place]  [facility] ([facility])    Detox - private pay only 14-day residential rehabilitation -  Medicaid, insurance, private pay only [phone], or [phone] [address] [place], [place] [address]   [facility] only Multiple facilities [phone] admissions    [facility] ([facility])   90-day program Must be homeless to participate   [phone], or [phone] Marcy Panning, [facility]  [facility] only [phone], or  [phone] [address] [place], [place] [address]  [facility] Must make an appointment Accepts private pay, [facility] [phone]  [address] [place], [place] [address]   [facility] Females only Associated with the [facility] [phone] [address] [place], [place] [address]  [facility] only [phone], or [phone] [address] [place], [place]  [facility]     Detox Residential rehabilitation Private insurance only Multiple locations [phone] admissions  [facility]     Private pay Private insurance [phone] [address] [place], [place] [address]  [facility]     Males only Fee required at time of admission [phone] [address] [place], [place] [address]  [facility]     Private pay only   [phone] [place], [place]  RTS (Residential Treatment Services)    Detox - private pay, Medicaid Residential rehabilitation for males  - Medicare, Medicaid, insurance, private pay [phone] [address] [place], [place]   OXBDZ              Walk-in interviews Monday - Saturday from 8 am - 4 pm Individuals with legal charges are not eligible [phone] [address] [place], [place] [address]  [facility]  Must be willing to work Must attend Alcoholics Anonymous meetings [phone] [address] [place], [place]   [facility] [employer]    Faith-based program Private pay only [phone] [address] [place], [place]     In a time of Crisis:   - [facility]   Mobile Crisis Management provides immediate crisis response, 24/7.   Call [phone]   [facility] for MH/DD/SA Services?Access Center is available 24 hours a day, 7 days a week. Customer Service Specialists will assist you to find a crisis provider that is well-matched with your needs. Your local number is:?[phone]   - [facility]  Center/Behavioral Health Urgent Care [facility])  OP, individual counseling, medication management  [address], [place] [address]  ; [phone]  Call for intake hours; Medicaid and Uninsured      Outpatient Providers    [facility] ([facility])  Group and individual counseling.  [address]   [place], [place] [address]  [phone]  [place]: [phone]  [place]: [phone]  Medicaid and uninsured.     [facility]  Offers IOP groups multiple times per week.  [address] [place], [place] [address]  [phone]  Takes Medicaid and other insurances.     [facility]   Chemical Dependency Intensive Outpatient Program (IOP)  [address]  [place], [place] [address]  [phone]  Takes Nurse, learning disability and [place].     [facility]   IOP and Partial Hospitalization Program   [address]   [place], [place] [address]  [phone]  Private Insurance, [place] only for partial hospitalization    [facility]  [address], [place], [place] [address]  [phone]  Monday-Friday. Short and Long term options.  [facility]al [facility] Urgent Care ([facility])  IOP, individual counseling, medication management  [address] [place], [place] [address]  [phone]  Medicaid and [facility]    [facility]  [address]   [place], [place] [address]  [phone]  Private Insurance and Self Pay     [facility]  [address]   [place], [place] [address]  [phone]  Private Insurance, [place], and Self Pay     [facility]   [address] [place], [place] [address]  [facility]   [address]   [place], [place] [address]  [phone]    [facility]   [address]  [place], [place] [address]  [phone]          Residential Treatment Programs    [facility] ([facility]  [address]  [place], [place] [address]  [phone] or [phone]  Detox and Residential Rehab 21 days  (Medicaid,  private insurance, and self pay. If Medicare, will look into funding). No methadone. Call for pre-screen.     [facility])  [address]   [place], [place] [address]  [phone]  Detox 3-7 days (self Pay and Medicaid Limited availability). Transitional Program for females needs 60 days clean first.   Rehab Only for Males (Medicare, Medicaid, and Self Pay)-No methadone.    [facility] [address]  [address]  [place], [place] [address]  [phone] or [phone]  Private Insurance only    [facility]  PHONE:?[phone]  FAX:?[phone]  Residential program for women 21 and over for up to a year through a Christian 12-step recovery model.  Self-pay.      [facility]  [address] [place], [place] [address]  Phone: ?[phone]  Must be detoxed 72 hours prior to admission; 28 day program.   Self-pay.    [facility]  [address]   [place], [place]  [phone]  [employer], Medicare, [place] (not straight [place]). They offer assistance with transportation.     [facility]  [address] [place],   [place], [place] [address]  [phone]  Christian Based Program. Men only.  No insurance    [facility] is a substance use disorder treatment program for women, including those who are pregnant, parenting, and/or whose lives have been touched by abuse and violence.  [phone]    [facility]  [address] [place], [place] [address]  Women's: [phone]  Men's: [phone]  No Medicaid.     [facility] [place]  Locations across the [place] (mainly [place]) willing to help with transportation.   [phone] [employer].  [facility]   [address]   [place], [place] [address]  [phone]  Treatment Only, must make assessment appointment, and must be sober for assessment appointment.  Self pay, [facility], must be [facility] resident. No methadone.     [facility]   [address]  [place], [place] [address]  [phone]  No pending legal charges, Long-term work program. No methadone. Call for assessment.    [facility]   [address]  [place], [place] [address]  [phone] or [phone]  Commercial Insurance Only    [facility]  Local - [phone]  [phone]  Private Insurance (no [place]).  Males/Females, call to make referrals, multiple facilities.     [facility]  [address],   [place], [place] [address]  ?([place]  Men Only Upfront Fee  [facility]  Women's Program: [facility]  [address]  [place], [place] [address]  [phone]    [facility]-no methadone:  [facility]  [address]  [place], [place] [address]  [phone] (f)    [facility]  [address] [place], [place] [address]  [phone] [facility]  [phone]  [place], [place]  For women, houses 8 residents for sober living. No Medicaid.               AA Meetings   Meeting Locator:  [email or web address]  Also can download an app on that website.    Syringe Services Program   Due to COVID-19, syringe services programs are likely operating under different hours with limited or no fixed site hours. Some programs may not be operating at all. Please contact the program directly using the phone numbers provided below to see if they are still operating under COVID-19.   [facility] Solution to the Opioid Problem (GCSTOP)  Fixed; mobile; peer-based  Roxy Cedar  [phone]  [email or web address]   Fixed site exchange at [facility], [address]  [place], [place] [address] on Wednesdays (2:00 - 5:00 pm) and Thursdays (4:00 - 8:00 pm).  Pop-up mobile  exchange locations:  [employer], [address] [place], [place] [address] on Tuesdays (11:00 am - 1:00 pm) and Fridays (11:00 am - 1:00 pm)   -[facility]?-?[address], [place], [place] [address] on Tuesdays (2:00 - 4:00 pm) and Fridays (2:00 - 4:00 pm)   -[facility]?- also serves [place] and [employer]  [place] [employer]  Fixed; mobile; peer-based  Lendon Ka  [phone]  [email or web address]  [address], [place], [place] [address]  Delivery and outreach available in [place] and [place], please call for more information. Monday, Tuesday: 1:00 -7:00 pm  Thursday: 4:00 pm - 8:00 pm  Friday: 1:00 pm - 8:00  pm)   Medication-Assisted Treatment (MAT)   -[place]- services [address] and surrounding areas including [place], [place], [place], [place], [address], [place], [place], [place], [place], and [place], [place].   Options include Methadone, buprenorphine or Suboxone.   [address] [place]  [place], [place] [address]  Phone: [phone]   Mon - Fri: 5:30am - 2:00pm  Sat: 5:30am -7:30am  Sun: Closed  Holidays: 6:00am - 8:00am    -[facility]- We use FDA-approved medications, like methadone/suboxone/sublocade, and vivitrol. These medications are then combined with customized care plans that include individual or group counseling, toxicology, and medical care directed by on-site physicians. Accepts most insurance plans, Medicaid, and private pay.    [address]  [place], [place] [address]  Phone:?[phone]   Monday-Friday 5:00 AM - 10:00 AM  Saturday  6:00 AM - 8:30 AM  Sunday 6:00 AM - 7:00 AM    -[facility]- [facility] is a treatment & recovery focused program. In addition to receiving methadone medication, our clients participate in individual and group counseling as well as random drug testing. If accepted into the [facility] Opioid  Program, you will be provided several intake appointments and a physical exam   [address]  [place], [place] [address]  Office: [phone]  Fax: [phone]     -[facility]- We put our community members at the center of everything we do, for remote treatment services as well as in-person, from alcohol withdrawal to opioid use and more.?   [address] [place] [address], [place], [place] [address]  [phone]   Monday-Wednesday:?9:00am - 5:00pm  Thursday:?9:00am - 6:00pm  Friday:?9:00am - 5:00pm  Saturday:?9:00am - 1:00pm  Sunday:?Closed      -[facility] [place]- Our clinic in [place], a medication unit, offers daily dosing of methadone or buprenorphine in addition to our clinic in [place] offering counseling to help people overcome addiction to heroin and other opiates. We also offer psychiatric services including medication management, and an office based suboxone program.   [address], [place], [place] [address]  Phone [phone]    [facility]-We treat Opioid Addiction using medications that are a combination of buprenorphine and naloxone, which are used to treat adults addicted to narcotic painkillers and drugs such as heroin. They reduce the intense cravings and painful symptoms that accompany withdrawal.   [address] [place], [place]  Phone: [phone]    [facility])  [address] [place], [place] [address]  [phone]   [place]  [phone]  [address] [place], [place] [address]   M-W??? 5:00am-12:00pm  Thu???? 5:00am-10:00am  Fri?????? 5:00am-12:00pm  Sat????? 5:00am-8:00am  Sun???? Closed   $12/daily for Methadone Treatment.      Recommendations:  Outpatient Psychiatric Follow-up:  [facility] [place] Address: [address], [place], [place] [address] Phone: [phone] Hours:  Monday-Thursday, 8:30 AM-5:00 PM; Friday, 8:30 AM-1:00 PM  Services Offered: Individual therapy, group therapy, family and couples therapy, children's therapy, dialectical and behavioral therapy groups, veterans' therapy, life skills education, and medication management.

## 2024-02-13 NOTE — Progress Notes (Signed)
 Speech Language Pathology Treatment: Dysphagia  Patient Details Name: Kyle Montoya MRN: [id number] DOB: 16-Sep-1951 Today's Date: 02/13/2024 Time: 4098-1191 SLP Time Calculation (min) (ACUTE ONLY): 11 min  Assessment / Plan / Recommendation Clinical Impression  Pt with no clinical improvements in swallowing when assessed at bedside with ice chips and water. He is awake, participatory. Denies difficulty swallowing but coughed today after 100% of trials.  Responds to cues to take smaller,  single sips rather than large sequential swallows (which leads to explosive wet coughing).  Provided verbal cues for effortful swallow in attempt to overcome presence of large osteophytes.  Pt completed 5 trials.    Palliative consult is pending to determine GOC and if pt/family want to pursue PEG. Prognosis for some recovery of swallowing- even to baseline, which was compromised- is unknown.  SLP will follow.   HPI HPI: Patient is a 73 y.o. male admitted with RLL pna, sepsis, heart failure, alcohol withdrawal.  PMH: ETOH abuse, COPD, HTN, heart failure, ascending aortic aneurysm, h/o lung cancer s/p resection and cholangiocarcinoma. SLP swallow evaluation ordered on 02/08/24 due to RN noting patient to exhibit coughing when drinking liquids. Review of records shows Dec 2009 DG cervical spine:  Severe degenerative disc disease changes.  Fusion from C2-C5  anteriorly due to flowing osteophytes. MBS 5/19 moderate pharyngeal dysphagia due to obstruction by osteophytes.      SLP Plan  Continue with current plan of care   Allow small sips of water and ice chips for pleasure   Recommendations for follow up therapy are one component of a multi-disciplinary discharge planning process, led by the attending physician.  Recommendations may be updated based on patient status, additional functional criteria and insurance authorization.    Recommendations  Diet recommendations: NPO Liquids provided via:  Cup;Straw Medication Administration: Crushed with puree Supervision: Patient able to self feed                  Oral care QID   Intermittent Supervision/Assistance Dysphagia, pharyngeal phase (R13.13)     Continue with current plan of care   Kyle Bost L. Beatris Lincoln, MA CCC/SLP Clinical Specialist - Acute Care SLP Acute Rehabilitation Services Office number [phone]   Kyle Montoya  02/13/2024, 12:54 PM

## 2024-02-13 NOTE — TOC Initial Note (Addendum)
 Transition of Care [facility]) - Initial/Assessment Note    Patient Details  Name: Kyle Montoya MRN: [id number] Date of Birth: 20-Jun-1951  [facility]) CM/SW Contact:    Juliane Och, LCSW Phone Number: 02/13/2024, 9:52 AM  Clinical Narrative:                  9:52 AM CSW spoke with patient's daugher, [name]. [name] confirmed that patient resides alone. [name] confirmed patient's HH history with [facility]. [name] confirmed DME (rolling walker and shower chair) at home. [name] confirmed that patient does not have SNF history. CSW informed [name] of physical therapy recommendation of patient discharging to SNF. [name] was agreeable to recommendation and consented CSW to send referrals to SNFs in and near [place] and [facility]. [name] consented CSW to add substance use resources to patient's AVS (patient is not fully oriented).  Expected Discharge Plan: Skilled Nursing Facility Barriers to Discharge: Continued Medical Work up, SNF Pending bed offer, Insurance Authorization   Patient Goals and CMS Choice            Expected Discharge Plan and Services In-house Referral: Clinical Social Work   Post Acute Care Choice: Skilled Nursing Facility Living arrangements for the past 2 months: Single Family Home                                      Prior Living Arrangements/Services Living arrangements for the past 2 months: Single Family Home Lives with:: Self Patient language and need for interpreter reviewed:: Yes Do you feel safe going back to the place where you live?: Yes      Need for Family Participation in Patient Care: Yes (Comment)     Criminal Activity/Legal Involvement Pertinent to Current Situation/Hospitalization: No - Comment as needed  Activities of Daily Living   ADL Screening (condition at time of admission) Independently performs ADLs?: Yes (appropriate for developmental age) Is the patient deaf or have difficulty hearing?:  No Does the patient have difficulty seeing, even when wearing glasses/contacts?: No Does the patient have difficulty concentrating, remembering, or making decisions?: No  Permission Sought/Granted Permission sought to share information with : Family Supports, Oceanographer granted to share information with : No (Contact information on chart)  Share Information with NAME: Sales executive  Permission granted to share info w AGENCY: SNF  Permission granted to share info w Relationship: Daughter  Permission granted to share info w Contact Information: [phone]  Emotional Assessment Appearance:: Appears stated age Attitude/Demeanor/Rapport: Unable to Assess Affect (typically observed): Unable to Assess Orientation: : Oriented to Self, Oriented to Place Alcohol / Substance Use: Alcohol Use Psych Involvement: No (comment)  Admission diagnosis:  Hyponatremia [E87.1] Peripheral edema [R60.0] Pneumonia [J18.9] Pleural effusion [J90] Acute kidney injury (HCC) [N17.9] Hypotension, unspecified hypotension type [I95.9] Acute on chronic congestive heart failure, unspecified heart failure type [facility]) [I50.9] Patient Active Problem List   Diagnosis Date Noted   Malnutrition of moderate degree 02/11/2024   Acute on chronic diastolic CHF (congestive heart failure) (HCC) 02/08/2024   Alcohol withdrawal (HCC) 02/08/2024   Right lower lobe pneumonia 02/05/2024   AKI (acute kidney injury) (HCC) 12/02/2023   Hypotension 12/02/2023   Alcohol abuse 12/02/2023   Hypokalemia 12/02/2023   Metabolic acidosis 12/02/2023   Shock (HCC) 12/02/2023   Orthostatic hypotension 10/10/2023   Acute diastolic (congestive) heart failure (HCC) 10/08/2023   Congestive heart failure (CHF) (  HCC) 09/26/2023   BPH (benign prostatic hyperplasia)    CAD (coronary artery disease)    Chronic myelogenous leukemia (HCC)    COPD (chronic obstructive pulmonary disease) (HCC)    DJD (degenerative  joint disease)    Type 2 diabetes mellitus with hyperlipidemia (HCC)    Family history of colonic polyps    HTN (hypertension)    Hypercholesterolemia    Lung cancer (HCC)    Myelogenous leukemia (HCC)    OA (osteoarthritis)    PAD (peripheral artery disease) (HCC)    Renal insufficiency    Tendonitis    Hyponatremia 09/19/2023   High coronary artery calcium score 05/30/2023   Diuretic-induced hypokalemia 05/23/2023    Class: Diagnosis of   Ascending aortic aneurysm (HCC) 06/27/2022   Thoracic aortic aneurysm (HCC) 06/27/2022   Abnormal transaminases 02/27/2022   History of colonic polyps 10/01/2021   Family history of prostate cancer 10/01/2021   Hypocalcemia 08/27/2021   Adenomatous polyp of descending colon 02/23/2021   History of colon polyps 02/23/2021   Abnormal colonoscopy 02/23/2021   Personal history of gastric ulcers 02/23/2021   Hypercalcemia 08/10/2020   Full thickness rotator cuff tear 08/08/2020   Impingement syndrome of both shoulders 03/31/2020   Shoulder pain, right 03/29/2020   Shoulder pain, left 03/29/2020   Adverse drug reaction, initial encounter 07/04/2016   Adult BMI 30+ 01/31/2015   Benign fibroma of prostate 01/31/2015   Cataract 01/31/2015   Chronic cough 01/31/2015   Chronic pain 01/31/2015   Arthritis, degenerative 01/31/2015   HLD (hyperlipidemia) 01/31/2015   Cannot sleep 01/31/2015   Non-small cell carcinoma of lung (HCC) 01/31/2015   Compulsive tobacco user syndrome 01/31/2015   Wheezing 01/31/2015   CML (chronic myelocytic leukemia) (HCC) 10/18/2014   Gout 10/18/2014   Cervical osteoarthritis 10/18/2014   Carcinoma of biliary tract (HCC) 02/15/2013   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   Anemia in chronic illness 01/22/2013   COPD with asthma (HCC) 01/22/2013   BP (high blood pressure) 01/22/2013   PCP:  Teofilo Fellers, NP Pharmacy:   [id number] [place], [place] - [address] [place] [address] Phone: [phone] Fax: [phone]  [facility] [place], [place] - [address] [facility] [address] [place] [address] Phone: [phone] Fax: [phone]     Social Drivers of Health (SDOH) Social History: SDOH Screenings   Food Insecurity: No Food Insecurity (02/06/2024)  Housing: Low Risk  (02/06/2024)  Transportation Needs: No Transportation Needs (02/06/2024)  Utilities: Not At Risk (02/06/2024)  Social Connections: Moderately Isolated (02/06/2024)  Tobacco Use: Medium Risk (02/05/2024)   SDOH Interventions:     Readmission Risk Interventions    12/08/2023   10:22 AM 12/03/2023   10:44 AM 10/09/2023    3:26 PM  Readmission Risk Prevention Plan  Transportation Screening  Complete Complete  PCP or Specialist Appt within 5-7 Days   Complete  PCP or Specialist Appt within 3-5 Days Complete    Home Care Screening   Complete  Medication Review (RN CM)   Complete  HRI or Home Care Consult Complete Complete   Social Work Consult for Recovery Care Planning/Counseling  Complete   Palliative Care Screening Complete Not Applicable   Medication Review Oceanographer) Complete Complete

## 2024-02-13 NOTE — Progress Notes (Addendum)
 PROGRESS NOTE    Kyle Montoya  ZOX:[id number] DOB: 1951/05/15 DOA: 02/05/2024 PCP: Teofilo Fellers, NP  73/M w diastolic CHF, COPD, EtOH abuse, history of multiple cancers namely lung cancer sp resection and cholangiocarcinoma presented to the ED with generalized weakness and lower extremity edema. In the ER he was hypotensive with lower extremity edema, labs noted hyponatremia, creatinine 1.5, mildly elevated LFTs, BNP 149, troponin 56, lactic acid 1.9, chest x-ray with pulmonary vascular congestion, pleural effusion -Fluid resuscitated, also treated with antibiotics and midodrine   - 5/16 developed EtOH withdrawal, started on phenobarbital , continued vasopressor support  - 5/17 weaned off vasopressors  -5/18 transfer to [facility].  -5/19 clinically improving, delirium, required mittens -5/20 noted to have dysphagia and not able to have enteral po nutrition. Ordered cortrak - 5/21: Cortrak placed, tube feeds started, delirious   Subjective: - Tolerating tube feeds and core track, some agitation overnight  Assessment and Plan:  Septic shock, resolved Right lower lobe/aspiration pneumonia -Completed antibiotic course - Dysphagia, see below  Severe dysphagia - On swallowing evaluation he was noted to have large flowing anterior osteophytes C2-C5 producing obstruction to his swallowing.  - Despite being awake, continued to do poorly with swallowing assessment, discussed with daughter, cortrak placed 5/21, now on tube feeds, will request palliative care evaluation on account of ongoing issues with delirium, numerous comorbidities etc. and facilitate further discussion regarding PEG tube  Acute on chronic diastolic CHF (congestive heart failure) (HCC) Echocardiogram with preserved LV systolic function EF 65 to 70%, RV systolic function preserved, no significant valvular disease.  -Continue blood pressure support with midodrine . - Diuretics on hold now, appears euvolemic  Iron deficiency  anemia - Will add IV iron therapy, trend hemoglobin, - Is not in clinical condition that is appropriate for further evaluation at this time, recommend GI follow-up  Alcohol withdrawal (HCC) -Now with improvement, now off phenobarbital  however he Continue neuro checks Continue thiamine  and multivitamins   Delirium, agitation - Day 8 of hospitalization, do not anticipate ongoing EtOH withdrawal at this time - Add Seroquel every afternoon, Haldol as needed  HTN (hypertension) Continue midodrine  for blood pressure control.    AKI (acute kidney injury) (HCC) Hyponatremia. hypokalemia - Resolved  CAD (coronary artery disease) No chest pain, no acute coronary syndrome   COPD with asthma (HCC) Stable Continue with bronchodilator therapy   Type 2 diabetes mellitus with hyperlipidemia (HCC) Will hold on insulin  therapy to avoid hypoglycemia.  Fasting glucose 97 mg/dl today   Ascending aortic aneurysm (HCC) Follow up as outpatient   Carcinoma of biliary tract (HCC) Follow up as outpatient.     DVT prophylaxis: SCDs Code Status: Discussed CODE STATUS with the patient this morning, he is agreeable to DNR Family Communication: none present, called and updated daughter 5/22 Disposition Plan: TBD  Consultants:    Procedures:   Antimicrobials:    Objective: Vitals:   02/12/24 2357 02/13/24 0450 02/13/24 0545 02/13/24 0743  BP: 133/75 126/78  110/65  Pulse: 81 79  75  Resp: 14 20  20   Temp: 97.7 F (36.5 C) 97.6 F (36.4 C)  97.9 F (36.6 C)  TempSrc: Oral Oral  Axillary  SpO2: 98% 98%  97%  Weight:   84.5 kg   Height:        Intake/Output Summary (Last 24 hours) at 02/13/2024 1144 Last data filed at 02/13/2024 0846 Gross per 24 hour  Intake 389.67 ml  Output --  Net 389.67 ml   [employer]  02/11/24 0400 02/12/24 0702 02/13/24 0545  Weight: 86.6 kg 83.9 kg 84.5 kg    Examination:  General exam: Chronically ill elderly male laying in bed, AO x 2,  cognitive deficits noted HEENT: No JVD, cortrak noted Respiratory system: Improving air movement, few scattered rhonchi  Cardiovascular system: S1-S2, regular rhythm Abd: nondistended, soft and nontender.Normal bowel sounds heard. Extremities: no edema Skin: No rashes Psychiatry: Flat affect    Data Reviewed:   CBC: Recent Labs  Lab 02/08/24 0233 02/08/24 1212 02/09/24 0230 02/11/24 0249 02/12/24 0235 02/13/24 0356  WBC 6.2  --  4.1 3.2* 3.8* 3.9*  HGB 10.4* 9.0* 8.5* 7.5* 7.4* 7.3*  HCT 28.5* 25.6* 25.4* 22.1* 21.5* 21.8*  MCV 99.3  --  105.0* 103.8* 105.4* 107.4*  PLT 256  --  190 208 239 247   Basic Metabolic Panel: Recent Labs  Lab 02/07/24 0740 02/08/24 0233 02/09/24 0230 02/10/24 0315 02/11/24 0249 02/12/24 0235 02/13/24 0356  NA 128*   < > 133* 134* 137 138 138  K 4.0   < > 4.3 4.1 3.6 3.9 3.6  CL 94*   < > 102 102 105 108 109  CO2 23   < > 19* 21* 23 22 24   GLUCOSE 93   < > 99 97 76 119* 110*  BUN 13   < > 18 13 9 11 10   CREATININE 0.64   < > 0.82 0.80 0.68 0.84 0.66  CALCIUM 7.7*   < > 8.3* 8.3* 8.1* 8.3* 8.0*  MG 1.7  --   --  1.7  --  1.8 1.9  PHOS 2.3*  --   --   --   --  2.8 2.1*   < > = values in this interval not displayed.   GFR: Estimated Creatinine Clearance: 88.9 mL/min (by C-G formula based on SCr of 0.66 mg/dL). Liver Function Tests: No results for input(s): "AST", "ALT", "ALKPHOS", "BILITOT", "PROT", "ALBUMIN " in the last 168 hours.  No results for input(s): "LIPASE", "AMYLASE" in the last 168 hours. Recent Labs  Lab 02/11/24 0830  AMMONIA 19   Coagulation Profile: No results for input(s): "INR", "PROTIME" in the last 168 hours.  Cardiac Enzymes: No results for input(s): "CKTOTAL", "CKMB", "CKMBINDEX", "TROPONINI" in the last 168 hours. BNP (last 3 results) No results for input(s): "PROBNP" in the last 8760 hours. HbA1C: No results for input(s): "HGBA1C" in the last 72 hours. CBG: Recent Labs  Lab 02/12/24 1700  02/12/24 1944 02/12/24 2356 02/13/24 0449 02/13/24 0741  GLUCAP 114* 124* 115* 117* 104*   Lipid Profile: No results for input(s): "CHOL", "HDL", "LDLCALC", "TRIG", "CHOLHDL", "LDLDIRECT" in the last 72 hours. Thyroid Function Tests: No results for input(s): "TSH", "T4TOTAL", "FREET4", "T3FREE", "THYROIDAB" in the last 72 hours. Anemia Panel: Recent Labs    02/11/24 0830  VITAMINB12 889  FOLATE 8.5  FERRITIN 284  TIBC 248*  IRON 32*  RETICCTPCT 2.9   Urine analysis:    Component Value Date/Time   COLORURINE YELLOW 02/05/2024 1826   APPEARANCEUR CLEAR 02/05/2024 1826   LABSPEC 1.005 02/05/2024 1826   PHURINE 6.0 02/05/2024 1826   GLUCOSEU NEGATIVE 02/05/2024 1826   HGBUR NEGATIVE 02/05/2024 1826   BILIRUBINUR NEGATIVE 02/05/2024 1826   KETONESUR NEGATIVE 02/05/2024 1826   PROTEINUR NEGATIVE 02/05/2024 1826   NITRITE NEGATIVE 02/05/2024 1826   LEUKOCYTESUR NEGATIVE 02/05/2024 1826   Sepsis Labs: @LABRCNTIP (procalcitonin:4,lacticidven:4)  ) Recent Results (from the past 240 hours)  Culture, blood (routine x 2)  Status: None   Collection Time: 02/05/24  4:52 PM   Specimen: BLOOD  Result Value Ref Range Status   Specimen Description BLOOD SITE NOT SPECIFIED  Final   Special Requests   Final    BOTTLES DRAWN AEROBIC AND ANAEROBIC Blood Culture results may not be optimal due to an inadequate volume of blood received in culture bottles   Culture   Final    NO GROWTH 5 DAYS Performed at [facility], [address] [place], [place] [address]    Report Status 02/10/2024 FINAL  Final  MRSA Next Gen by PCR, Nasal     Status: None   Collection Time: 02/06/24  2:39 AM   Specimen: Nasal Mucosa; Nasal Swab  Result Value Ref Range Status   MRSA by PCR Next Gen NOT DETECTED NOT DETECTED Final    Comment: (NOTE) The GeneXpert MRSA Assay (FDA approved for NASAL specimens only), is one component of a comprehensive MRSA colonization surveillance program. It is not  intended to diagnose MRSA infection nor to guide or monitor treatment for MRSA infections. Test performance is not FDA approved in patients less than 57 years old. Performed at [facility], [address] [place], [place] [address]   Culture, blood (routine x 2)     Status: None   Collection Time: 02/06/24  3:44 AM   Specimen: BLOOD RIGHT ARM  Result Value Ref Range Status   Specimen Description BLOOD RIGHT ARM  Final   Special Requests   Final    BOTTLES DRAWN AEROBIC AND ANAEROBIC Blood Culture adequate volume   Culture   Final    NO GROWTH 5 DAYS Performed at [facility], [address] [place], [place] [address]    Report Status 02/11/2024 FINAL  Final     Radiology Studies: DG Abd Portable 1V Result Date: 02/11/2024 CLINICAL DATA:  Feeding tube placement. EXAM: PORTABLE ABDOMEN - 1 VIEW COMPARISON:  None Available. FINDINGS: Tip of the weighted enteric tube below the diaphragm in the midline in the region of the distal stomach. No bowel dilatation to suggest obstruction. There is barium in the colon from recent barium swallow. IMPRESSION: Tip of the weighted enteric tube below the diaphragm in the region of the distal stomach. Electronically Signed   By: Chadwick Colonel M.D.   On: 02/11/2024 15:23     Scheduled Meds:  Chlorhexidine  Gluconate Cloth  6 each Topical Daily   famotidine  40 mg Per Tube BID   feeding supplement (PROSource TF20)  60 mL Per Tube Daily   folic acid   1 mg Per Tube Daily   free water  200 mL Per Tube Q6H   lactulose  20 g Per Tube BID   midodrine   10 mg Per Tube TID WC   multivitamin with minerals  1 tablet Per Tube Daily   QUEtiapine  25 mg Oral QHS   senna-docusate  1 tablet Per Tube BID   thiamine   100 mg Per Tube Q24H   Continuous Infusions:  feeding supplement (OSMOLITE 1.5 CAL) 1,000 mL (02/13/24 0232)   iron sucrose 200 mg (02/12/24 1638)     LOS: 8 days    Time spent:    Deforest Fast, MD [facility]   02/13/2024, 11:45 AM

## 2024-02-13 NOTE — Plan of Care (Signed)
  Problem: Metabolic: Goal: Ability to maintain appropriate glucose levels will improve Outcome: Progressing   Problem: Skin Integrity: Goal: Risk for impaired skin integrity will decrease Outcome: Progressing   Problem: Coping: Goal: Level of anxiety will decrease Outcome: Progressing

## 2024-02-14 DIAGNOSIS — Z7189 Other specified counseling: Secondary | ICD-10-CM | POA: Diagnosis not present

## 2024-02-14 DIAGNOSIS — Z515 Encounter for palliative care: Secondary | ICD-10-CM | POA: Diagnosis not present

## 2024-02-14 DIAGNOSIS — R1310 Dysphagia, unspecified: Secondary | ICD-10-CM

## 2024-02-14 DIAGNOSIS — J189 Pneumonia, unspecified organism: Secondary | ICD-10-CM | POA: Diagnosis not present

## 2024-02-14 DIAGNOSIS — N179 Acute kidney failure, unspecified: Secondary | ICD-10-CM | POA: Diagnosis not present

## 2024-02-14 LAB — CBC
HCT: 22.1 % — ABNORMAL LOW (ref 39.0–52.0)
Hemoglobin: 7.4 g/dL — ABNORMAL LOW (ref 13.0–17.0)
MCH: 36.5 pg — ABNORMAL HIGH (ref 26.0–34.0)
MCHC: 33.5 g/dL (ref 30.0–36.0)
MCV: 108.9 fL — ABNORMAL HIGH (ref 80.0–100.0)
Platelets: 263 10*3/uL (ref 150–400)
RBC: 2.03 MIL/uL — ABNORMAL LOW (ref 4.22–5.81)
RDW: 17.6 % — ABNORMAL HIGH (ref 11.5–15.5)
WBC: 6.3 10*3/uL (ref 4.0–10.5)
nRBC: 2.9 % — ABNORMAL HIGH (ref 0.0–0.2)

## 2024-02-14 LAB — GLUCOSE, CAPILLARY
Glucose-Capillary: 100 mg/dL — ABNORMAL HIGH (ref 70–99)
Glucose-Capillary: 103 mg/dL — ABNORMAL HIGH (ref 70–99)
Glucose-Capillary: 109 mg/dL — ABNORMAL HIGH (ref 70–99)
Glucose-Capillary: 111 mg/dL — ABNORMAL HIGH (ref 70–99)
Glucose-Capillary: 111 mg/dL — ABNORMAL HIGH (ref 70–99)
Glucose-Capillary: 115 mg/dL — ABNORMAL HIGH (ref 70–99)
Glucose-Capillary: 99 mg/dL (ref 70–99)

## 2024-02-14 LAB — BASIC METABOLIC PANEL WITH GFR
Anion gap: 8 (ref 5–15)
BUN: 12 mg/dL (ref 8–23)
CO2: 22 mmol/L (ref 22–32)
Calcium: 8.1 mg/dL — ABNORMAL LOW (ref 8.9–10.3)
Chloride: 108 mmol/L (ref 98–111)
Creatinine, Ser: 0.66 mg/dL (ref 0.61–1.24)
GFR, Estimated: >60 mL/min (ref >60)
Glucose, Bld: 92 mg/dL (ref 70–99)
Potassium: 4.3 mmol/L (ref 3.5–5.1)
Sodium: 138 mmol/L (ref 135–145)

## 2024-02-14 LAB — PHOSPHORUS: Phosphorus: 2.3 mg/dL — ABNORMAL LOW (ref 2.5–4.6)

## 2024-02-14 LAB — MAGNESIUM: Magnesium: 2 mg/dL (ref 1.7–2.4)

## 2024-02-14 MED ORDER — CARMEX CLASSIC LIP BALM EX OINT
TOPICAL_OINTMENT | CUTANEOUS | Status: DC | PRN
  Filled 2024-02-14: qty 10

## 2024-02-14 MED ORDER — MIDODRINE HCL 5 MG PO TABS
10.0000 mg | ORAL_TABLET | Freq: Two times a day (BID) | ORAL | Status: DC
  Administered 2024-02-14 – 2024-02-16 (×5): 10 mg
  Filled 2024-02-14 (×5): qty 2

## 2024-02-14 NOTE — Progress Notes (Signed)
 Bladder scan performed resulting at 449 mL, attempted to notify Mansy, MD. See new orders. Unable to advance catheter; Mansy, MD notified & Coude catheter order placed.   Sonjia Durie, RN

## 2024-02-14 NOTE — Progress Notes (Signed)
 Palliative Medicine Progress Note   Patient Name: Kyle Montoya       Date: 02/14/2024 DOB: [birth year]  Age: 73 y.o. MRN#: [id number] Attending Physician: Deforest Fast, MD Primary Care Physician: Teofilo Fellers, NP Admit Date: 02/05/2024  Reason for Consultation/Follow-up: {Reason for Consult:23484}  HPI/Patient Profile: 73 y.o. male  with past medical history of chronic diastolic CHF, COPD, alcohol use disorder, and chronic myelogenous leukemia on Dasatinib . He also has a history history of stage Ia non-small lung cell cancer (2016) and stage Ia cholangiocarcinoma (2014), both treated with surgery alone and both with no evidence of disease recurrence on scans in October 2024.  He presented to the ED on 02/05/2024 with generalized weakness and lower extremity edema.  He was found to be hypotensive and was admitted with septic shock secondary to right lower lobe aspiration pneumonia.     Hospitalization has been complicated by alcohol withdrawal requiring phenobarbital  as well as severe dysphagia secondary to C2-C5 osteophytes.   Palliative Medicine has been consulted for  goals of care.   Subjective: Chart reviewed. Note nursing documentation that patient was agitated and anxious overnight.   Objective:  Physical Exam Vitals reviewed.  Constitutional:      General: He is not in acute distress.    Comments: Chronically ill-appearing  HENT:     Head:     Comments: Cortrak in place Cardiovascular:     Rate and Rhythm: Normal rate.  Pulmonary:     Effort: No respiratory distress.  Neurological:     Mental Status: He is alert. He is confused.              Palliative Medicine Assessment & Plan   Assessment: Principal Problem:   Right lower lobe pneumonia Active Problems:    COPD with asthma (HCC)   Carcinoma of biliary tract (HCC)   Ascending aortic aneurysm (HCC)   CAD (coronary artery disease)   Type 2 diabetes mellitus with hyperlipidemia (HCC)   HTN (hypertension)   AKI (acute kidney injury) (HCC)   Acute on chronic diastolic CHF (congestive heart failure) (HCC)   Alcohol withdrawal (HCC)   Malnutrition of moderate degree    Recommendations/Plan: Continue current supportive interventions Family seems to be leaning toward PEG, but not made a final decision PMT will follow-up tomorrow  Primary Decision Maker: NEXT  OF KIN - daughter and son  Code Status: DNR - Limited   Prognosis:  {Palliative Care Prognosis:23504}  Discharge Planning: {Palliative dispostion:23505}  Care plan was discussed with ***  Thank you for allowing the Palliative Medicine Team to assist in the care of this patient.   ***   Wynetta Heckle, NP   Please contact Palliative Medicine Team phone at [phone] for questions and concerns.  For individual providers, please see AMION.

## 2024-02-14 NOTE — Progress Notes (Signed)
 Patient had not voided since beginning of shift. Bladder scan 316. Straight cath performed. 500 yellow urine with sediment returned.

## 2024-02-14 NOTE — Progress Notes (Signed)
 Patient complaining of pain in left arm immediately below IV cannula placement. Area is red and a bit swollen. IV taken out.

## 2024-02-14 NOTE — Progress Notes (Signed)
 PROGRESS NOTE    Kyle Montoya  ZOX:[id number] DOB: 01-Oct-1950 DOA: 02/05/2024 PCP: Teofilo Fellers, NP  73/M w diastolic CHF, COPD, EtOH abuse, history of multiple cancers namely lung cancer sp resection and cholangiocarcinoma presented to the ED with generalized weakness and lower extremity edema. In the ER he was hypotensive with lower extremity edema, labs noted hyponatremia, creatinine 1.5, mildly elevated LFTs, BNP 149, troponin 56, lactic acid 1.9, chest x-ray with pulmonary vascular congestion, pleural effusion -Fluid resuscitated, also treated with antibiotics and midodrine   - 5/16 developed EtOH withdrawal, started on phenobarbital , continued vasopressor support  - 5/17 weaned off vasopressors  -5/18 transfer to [facility].  -5/19 clinically improving, delirium, required mittens -5/20 noted to have dysphagia and not able to have enteral po nutrition. Ordered cortrak - 5/21: Cortrak placed, tube feeds started, delirious - 5/22: Palliative care consulted, goals of care discussions ongoing   Subjective: - Feels fair, no agitation overnight, tolerating tube feeds  Assessment and Plan:  Septic shock, resolved Right lower lobe/aspiration pneumonia -Completed antibiotic course - Dysphagia, see below  Severe dysphagia - On swallowing evaluation he was noted to have large flowing anterior osteophytes C2-C5 producing obstruction to his swallowing.  - Despite being awake, continued to do poorly with swallowing assessment, discussed with daughter, cortrak placed 5/21, now on tube feeds, requested palliative care evaluation on account of ongoing issues with delirium, numerous comorbidities etc. and facilitate further discussion regarding PEG tube - Appreciate palliative assistance, continue goals of care discussions  Acute on chronic diastolic CHF (congestive heart failure) (HCC) Echocardiogram with preserved LV systolic function EF 65 to 70%, RV systolic function preserved, no significant  valvular disease.  - Cut down midodrine  - Diuretics on hold now, appears euvolemic  Iron deficiency anemia - Given IV iron - Is not in clinical condition that is appropriate for further evaluation at this time, recommend GI follow-up  Alcohol withdrawal (HCC) -Now with improvement, now off phenobarbital  Continue neuro checks Continue thiamine  and multivitamins   Delirium, agitation - Intermittent delirium, overall improving now, continue Qpm Seroquel - Haldol as needed  HTN (hypertension) Continue midodrine  for blood pressure control.    AKI (acute kidney injury) (HCC) Hyponatremia. hypokalemia - Resolved  CAD (coronary artery disease) No chest pain, no acute coronary syndrome   COPD with asthma (HCC) Stable Continue with bronchodilator therapy   Type 2 diabetes mellitus with hyperlipidemia (HCC) Stable, hold off on insulin   Ascending aortic aneurysm (HCC) Follow up as outpatient   Carcinoma of biliary tract (HCC) Follow up as outpatient.     DVT prophylaxis: SCDs Code Status: Discussed CODE STATUS with the patient this morning, he is agreeable to DNR Family Communication: none present, called and updated daughter 5/22 Disposition Plan: TBD  Consultants:    Procedures:   Antimicrobials:    Objective: Vitals:   02/14/24 0215 02/14/24 0309 02/14/24 0448 02/14/24 0719  BP: 119/67  117/78 103/65  Pulse: 84     Resp: 17     Temp: 98.4 F (36.9 C)   97.6 F (36.4 C)  TempSrc: Oral   Oral  SpO2: 98%     Weight:  84.6 kg    Height:        Intake/Output Summary (Last 24 hours) at 02/14/2024 1019 Last data filed at 02/14/2024 0230 Gross per 24 hour  Intake 30 ml  Output 625 ml  Net -595 ml   [employer]   02/12/24 0702 02/13/24 0545 02/14/24 0309  Weight: 83.9 kg 84.5 kg  84.6 kg    Examination:  General exam: Chronically ill elderly male lying in bed, AO x 2, mild cognitive deficits HEENT: No JVD,  cortrak noted VS: S1-S2, regular  rhythm Lungs: Few scattered rhonchi otherwise clear Abdomen: Soft, nontender, bowel sounds present EXTR: No edema  psych: Flat affect  Data Reviewed:   CBC: Recent Labs  Lab 02/09/24 0230 02/11/24 0249 02/12/24 0235 02/13/24 0356 02/14/24 0532  WBC 4.1 3.2* 3.8* 3.9* 6.3  HGB 8.5* 7.5* 7.4* 7.3* 7.4*  HCT 25.4* 22.1* 21.5* 21.8* 22.1*  MCV 105.0* 103.8* 105.4* 107.4* 108.9*  PLT 190 208 239 247 263   Basic Metabolic Panel: Recent Labs  Lab 02/10/24 0315 02/11/24 0249 02/12/24 0235 02/13/24 0356 02/14/24 0245  NA 134* 137 138 138 138  K 4.1 3.6 3.9 3.6 4.3  CL 102 105 108 109 108  CO2 21* 23 22 24 22   GLUCOSE 97 76 119* 110* 92  BUN 13 9 11 10 12   CREATININE 0.80 0.68 0.84 0.66 0.66  CALCIUM 8.3* 8.1* 8.3* 8.0* 8.1*  MG 1.7  --  1.8 1.9 2.0  PHOS  --   --  2.8 2.1* 2.3*   GFR: Estimated Creatinine Clearance: 88.9 mL/min (by C-G formula based on SCr of 0.66 mg/dL). Liver Function Tests: No results for input(s): "AST", "ALT", "ALKPHOS", "BILITOT", "PROT", "ALBUMIN " in the last 168 hours.  No results for input(s): "LIPASE", "AMYLASE" in the last 168 hours. Recent Labs  Lab 02/11/24 0830  AMMONIA 19   Coagulation Profile: No results for input(s): "INR", "PROTIME" in the last 168 hours.  Cardiac Enzymes: No results for input(s): "CKTOTAL", "CKMB", "CKMBINDEX", "TROPONINI" in the last 168 hours. BNP (last 3 results) No results for input(s): "PROBNP" in the last 8760 hours. HbA1C: No results for input(s): "HGBA1C" in the last 72 hours. CBG: Recent Labs  Lab 02/13/24 1650 02/13/24 2004 02/14/24 0011 02/14/24 0405 02/14/24 0811  GLUCAP 93 103* 115* 100* 111*   Lipid Profile: No results for input(s): "CHOL", "HDL", "LDLCALC", "TRIG", "CHOLHDL", "LDLDIRECT" in the last 72 hours. Thyroid Function Tests: No results for input(s): "TSH", "T4TOTAL", "FREET4", "T3FREE", "THYROIDAB" in the last 72 hours. Anemia Panel: No results for input(s): "VITAMINB12",  "FOLATE", "FERRITIN", "TIBC", "IRON", "RETICCTPCT" in the last 72 hours.  Urine analysis:    Component Value Date/Time   COLORURINE YELLOW 02/05/2024 1826   APPEARANCEUR CLEAR 02/05/2024 1826   LABSPEC 1.005 02/05/2024 1826   PHURINE 6.0 02/05/2024 1826   GLUCOSEU NEGATIVE 02/05/2024 1826   HGBUR NEGATIVE 02/05/2024 1826   BILIRUBINUR NEGATIVE 02/05/2024 1826   KETONESUR NEGATIVE 02/05/2024 1826   PROTEINUR NEGATIVE 02/05/2024 1826   NITRITE NEGATIVE 02/05/2024 1826   LEUKOCYTESUR NEGATIVE 02/05/2024 1826   Sepsis Labs: @LABRCNTIP (procalcitonin:4,lacticidven:4)  ) Recent Results (from the past 240 hours)  Culture, blood (routine x 2)     Status: None   Collection Time: 02/05/24  4:52 PM   Specimen: BLOOD  Result Value Ref Range Status   Specimen Description BLOOD SITE NOT SPECIFIED  Final   Special Requests   Final    BOTTLES DRAWN AEROBIC AND ANAEROBIC Blood Culture results may not be optimal due to an inadequate volume of blood received in culture bottles   Culture   Final    NO GROWTH 5 DAYS Performed at [facility], [address], [place], [place] [address]    Report Status 02/10/2024 FINAL  Final  MRSA Next Gen by PCR, Nasal     Status: None  Collection Time: 02/06/24  2:39 AM   Specimen: Nasal Mucosa; Nasal Swab  Result Value Ref Range Status   MRSA by PCR Next Gen NOT DETECTED NOT DETECTED Final    Comment: (NOTE) The GeneXpert MRSA Assay (FDA approved for NASAL specimens only), is one component of a comprehensive MRSA colonization surveillance program. It is not intended to diagnose MRSA infection nor to guide or monitor treatment for MRSA infections. Test performance is not FDA approved in patients less than 22 years old. Performed at [facility], [address], [place], [place] [address]   Culture, blood (routine x 2)     Status: None   Collection Time: 02/06/24  3:44 AM   Specimen: BLOOD RIGHT ARM  Result Value Ref Range Status    Specimen Description BLOOD RIGHT ARM  Final   Special Requests   Final    BOTTLES DRAWN AEROBIC AND ANAEROBIC Blood Culture adequate volume   Culture   Final    NO GROWTH 5 DAYS Performed at [facility], [address], [place], [place] [address]    Report Status 02/11/2024 FINAL  Final     Radiology Studies: No results found.    Scheduled Meds:  Chlorhexidine  Gluconate Cloth  6 each Topical Daily   famotidine  40 mg Per Tube BID   feeding supplement (PROSource TF20)  60 mL Per Tube Daily   folic acid   1 mg Per Tube Daily   free water  200 mL Per Tube Q6H   midodrine   10 mg Per Tube BID WC   multivitamin with minerals  1 tablet Per Tube Daily   QUEtiapine  25 mg Oral QHS   senna-docusate  1 tablet Per Tube BID   thiamine   100 mg Per Tube Q24H   Continuous Infusions:  feeding supplement (OSMOLITE 1.5 CAL) 1,000 mL (02/13/24 0232)     LOS: 9 days    Time spent:    Deforest Fast, MD [facility]   02/14/2024, 10:19 AM

## 2024-02-14 NOTE — Progress Notes (Signed)
 Patient has been fairly agitated and anxious most of the night. He has not slept at all. He has been twisting wires, unhooked tele wires, taking off bandages, and unhooked his feeds while they were running. He has been asking for his wallet even though it has been found twice for him. He has not slept at all and just generally will not hold his hands still, instead is constantly fidgeting with anything near him. Haldol given prn.

## 2024-02-14 NOTE — Plan of Care (Signed)
   Problem: Education: Goal: Ability to describe self-care measures that may prevent or decrease complications (Diabetes Survival Skills Education) will improve Outcome: Progressing   Problem: Education: Goal: Individualized Educational Video(s) Outcome: Progressing   Problem: Coping: Goal: Ability to adjust to condition or change in health will improve Outcome: Progressing

## 2024-02-14 NOTE — Plan of Care (Signed)
  Problem: Coping: Goal: Ability to adjust to condition or change in health will improve Outcome: Progressing   Problem: Nutritional: Goal: Maintenance of adequate nutrition will improve Outcome: Progressing   Problem: Skin Integrity: Goal: Risk for impaired skin integrity will decrease Outcome: Progressing   Problem: Clinical Measurements: Goal: Diagnostic test results will improve Outcome: Progressing   Problem: Activity: Goal: Risk for activity intolerance will decrease Outcome: Progressing

## 2024-02-15 DIAGNOSIS — I509 Heart failure, unspecified: Secondary | ICD-10-CM | POA: Diagnosis not present

## 2024-02-15 DIAGNOSIS — Z515 Encounter for palliative care: Secondary | ICD-10-CM | POA: Diagnosis not present

## 2024-02-15 DIAGNOSIS — Z7189 Other specified counseling: Secondary | ICD-10-CM | POA: Diagnosis not present

## 2024-02-15 DIAGNOSIS — J189 Pneumonia, unspecified organism: Secondary | ICD-10-CM | POA: Diagnosis not present

## 2024-02-15 LAB — GLUCOSE, CAPILLARY
Glucose-Capillary: 116 mg/dL — ABNORMAL HIGH (ref 70–99)
Glucose-Capillary: 115 mg/dL — ABNORMAL HIGH (ref 70–99)
Glucose-Capillary: 107 mg/dL — ABNORMAL HIGH (ref 70–99)
Glucose-Capillary: 84 mg/dL (ref 70–99)
Glucose-Capillary: 111 mg/dL — ABNORMAL HIGH (ref 70–99)

## 2024-02-15 MED ORDER — ACETAMINOPHEN 500 MG PO TABS
1000.0000 mg | ORAL_TABLET | Freq: Once | ORAL | Status: AC
  Administered 2024-02-15: 1000 mg via ORAL
  Filled 2024-02-15: qty 2

## 2024-02-15 MED ORDER — TAMSULOSIN HCL 0.4 MG PO CAPS
0.8000 mg | ORAL_CAPSULE | Freq: Every day | ORAL | Status: DC
  Administered 2024-02-16 – 2024-02-19 (×3): 0.8 mg via ORAL
  Filled 2024-02-15 (×4): qty 2

## 2024-02-15 MED ORDER — LIDOCAINE HCL URETHRAL/MUCOSAL 2 % EX GEL
1.0000 | Freq: Once | CUTANEOUS | Status: AC
  Administered 2024-02-15: 1 via URETHRAL
  Filled 2024-02-15: qty 6

## 2024-02-15 MED ORDER — METHOCARBAMOL 500 MG PO TABS
500.0000 mg | ORAL_TABLET | Freq: Three times a day (TID) | ORAL | Status: DC | PRN
  Administered 2024-02-15: 500 mg via ORAL
  Filled 2024-02-15: qty 1

## 2024-02-15 NOTE — Progress Notes (Signed)
 Palliative Medicine Progress Note   Patient Name: Kyle Montoya       Date: 02/15/2024 DOB: 21-Jul-1951  Age: 73 y.o. MRN#: [id number] Attending Physician: Deforest Fast, MD Primary Care Physician: Teofilo Fellers, NP Admit Date: 02/05/2024   HPI/Patient Profile: 73 y.o. male  with past medical history of chronic diastolic CHF, COPD, alcohol use disorder, and chronic myelogenous leukemia on Dasatinib . He also has a history history of stage 1a non-small lung cell cancer (2016) and stage 1a cholangiocarcinoma (2014), both treated with surgery alone and both with no evidence of disease recurrence on scans in October 2024.  He presented to the ED on 02/05/2024 with generalized weakness and lower extremity edema.  He was found to be hypotensive and was admitted with septic shock secondary to right lower lobe aspiration pneumonia.     Hospitalization has been complicated by alcohol withdrawal requiring phenobarbital  as well as severe dysphagia secondary to C2-C5 osteophytes.   Palliative Medicine has been consulted for  goals of care.   Subjective: Chart reviewed. Updates received. Patient assessed at bedside. He is oriented x 3. Mental status seems improved but he still seems to have limited insight into his current medical situation. When I offered education, he is agreeable to rehab and to PEG placement.   I spoke with daughter/[name] and son/Mancel Marieta Shorten by phone.  Ongoing conversation was had regarding patient's severe dysphagia due to osteophytes. Discussed that per SLP, patient has had no clinical improvements in swallowing, and continues to aspirate with oral intake. Discussed concern for recurrent aspiration pneumonia.    Discussed that if the goal is rehab and to allow time/opportunity for  potential improvement, patient will need a PEG. Offered education that he would likely need the PEG for the rest of his life, as he would not be a good surgical candidate for removal of the cervical osteophytes.   After further discussion, daughter and son are both agreeable to a PEG. They are hopeful his functional status will improve with rehab and that he will ultimately be able to return home.    Objective:  Physical Exam Vitals reviewed.  Constitutional:      General: He is not in acute distress.    Comments: Chronically ill-appearing  HENT:     Head:     Comments: Cortrak in place Pulmonary:  Effort: Pulmonary effort is normal.  Neurological:     Mental Status: He is alert and oriented to person, place, and time.             Palliative Medicine Assessment & Plan   Assessment: Principal Problem:   Right lower lobe pneumonia Active Problems:   COPD with asthma (HCC)   Carcinoma of biliary tract (HCC)   Ascending aortic aneurysm (HCC)   CAD (coronary artery disease)   Type 2 diabetes mellitus with hyperlipidemia (HCC)   HTN (hypertension)   AKI (acute kidney injury) (HCC)   Acute on chronic diastolic CHF (congestive heart failure) (HCC)   Alcohol withdrawal (HCC)   Malnutrition of moderate degree    Recommendations/Plan: Continue current supportive interventions Family agrees to proceed with PEG Goal is for rehab to improve functional status, so that patient can ultimately return home PMT will continue to follow  Primary Decision Maker: NEXT OF KIN - daughter and son   Code Status: DNR - Limited  Prognosis:  Unable to determine  Discharge Planning: To Be Determined   Thank you for allowing the Palliative Medicine Team to assist in the care of this patient.   Time: 50 minutes   Wynetta Heckle, NP   Please contact Palliative Medicine Team phone at [phone] for questions and concerns.  For individual providers, please see  [facility].

## 2024-02-15 NOTE — Plan of Care (Signed)
   Problem: Education: Goal: Ability to describe self-care measures that may prevent or decrease complications (Diabetes Survival Skills Education) will improve Outcome: Progressing   Problem: Education: Goal: Individualized Educational Video(s) Outcome: Progressing   Problem: Coping: Goal: Ability to adjust to condition or change in health will improve Outcome: Progressing

## 2024-02-15 NOTE — Progress Notes (Signed)
 PROGRESS NOTE    Kyle Montoya  ZOX:[id number] DOB: 1950-09-28 DOA: 02/05/2024 PCP: Teofilo Fellers, NP  73/M w diastolic CHF, COPD, EtOH abuse, history of multiple cancers namely lung cancer sp resection and cholangiocarcinoma presented to the ED with generalized weakness and lower extremity edema. In the ER he was hypotensive with lower extremity edema, labs noted hyponatremia, creatinine 1.5, mildly elevated LFTs, BNP 149, troponin 56, lactic acid 1.9, chest x-ray with pulmonary vascular congestion, pleural effusion -Fluid resuscitated, also treated with antibiotics and midodrine   - 5/16 developed EtOH withdrawal, started on phenobarbital , continued vasopressor support  - 5/17 weaned off vasopressors  -5/18 transfer to [facility].  -5/19 clinically improving, delirium, required mittens -5/20 noted to have dysphagia and not able to have enteral po nutrition. Ordered cortrak - 5/21: Cortrak placed, tube feeds started, delirious - 5/22: Palliative care consulted, goals of care discussions ongoing   Subjective: - Feels okay, no events overnight, tolerating tube feeds  Assessment and Plan:  Septic shock, resolved Right lower lobe/aspiration pneumonia -Completed antibiotic course - Dysphagia, see below  Severe dysphagia - On swallowing evaluation he was noted to have large flowing anterior osteophytes C2-C5 producing obstruction to his swallowing.  - Despite being awake, continued to do poorly with swallowing assessment, discussed with daughter, cortrak placed 5/21, now on tube feeds, requested palliative care evaluation on account of ongoing issues with delirium, numerous comorbidities etc. and facilitate further discussion regarding PEG tube - Appreciate palliative assistance, goals of care discussions ongoing, patient is leaning towards proceeding with PEG tube, await further confirmation with family today  Acute on chronic diastolic CHF (congestive heart failure) (HCC) Echocardiogram with  preserved LV systolic function EF 65 to 70%, RV systolic function preserved, no significant valvular disease.  - Cut down midodrine  - Diuretics on hold now, appears euvolemic  Iron deficiency anemia - Given IV iron - Is not in clinical condition that is appropriate for further evaluation at this time, recommend GI follow-up  Alcohol withdrawal (HCC) - Resolved, now off phenobarbital  Continue thiamine  and multivitamins   Delirium, agitation - Intermittent delirium, overall improving now, continue Qpm Seroquel - Haldol as needed  HTN (hypertension) Continue midodrine  for blood pressure control.    AKI (acute kidney injury) (HCC) Hyponatremia. hypokalemia - Resolved  CAD (coronary artery disease) No chest pain, no acute coronary syndrome   COPD with asthma (HCC) Stable Continue with bronchodilator therapy   Type 2 diabetes mellitus with hyperlipidemia (HCC) Stable, hold off on insulin   Ascending aortic aneurysm (HCC) Follow up as outpatient   Carcinoma of biliary tract (HCC) Follow up as outpatient.     DVT prophylaxis: SCDs Code Status: DNR Family Communication: none present, called and updated daughter 5/23 Disposition Plan: TBD  Consultants:    Procedures:   Antimicrobials:    Objective: Vitals:   02/14/24 2330 02/15/24 0351 02/15/24 0725 02/15/24 1127  BP: 106/78 128/78 117/83 105/88  Pulse: 84  79 70  Resp: 20 17 15    Temp: 98.4 F (36.9 C) 98.2 F (36.8 C) 98.1 F (36.7 C) 98.3 F (36.8 C)  TempSrc: Oral Oral Oral Oral  SpO2: 96% 94% 96% 95%  Weight:      Height:        Intake/Output Summary (Last 24 hours) at 02/15/2024 1130 Last data filed at 02/15/2024 0457 Gross per 24 hour  Intake 1264.91 ml  Output 1150 ml  Net 114.91 ml   Filed Weights   02/12/24 0702 02/13/24 0545 02/14/24 0309  Weight: 83.9  kg 84.5 kg 84.6 kg    Examination:  General exam: Chronically ill elderly male lying in bed, AO x 2, mild cognitive deficits HEENT:  No JVD,  cortrak noted VS: S1-S2, regular rhythm Lungs: Few scattered rhonchi otherwise clear Abdomen: Soft, nontender, bowel sounds present EXTR: No edema  psych: Flat affect  Data Reviewed:   CBC: Recent Labs  Lab 02/09/24 0230 02/11/24 0249 02/12/24 0235 02/13/24 0356 02/14/24 0532  WBC 4.1 3.2* 3.8* 3.9* 6.3  HGB 8.5* 7.5* 7.4* 7.3* 7.4*  HCT 25.4* 22.1* 21.5* 21.8* 22.1*  MCV 105.0* 103.8* 105.4* 107.4* 108.9*  PLT 190 208 239 247 263   Basic Metabolic Panel: Recent Labs  Lab 02/10/24 0315 02/11/24 0249 02/12/24 0235 02/13/24 0356 02/14/24 0245  NA 134* 137 138 138 138  K 4.1 3.6 3.9 3.6 4.3  CL 102 105 108 109 108  CO2 21* 23 22 24 22   GLUCOSE 97 76 119* 110* 92  BUN 13 9 11 10 12   CREATININE 0.80 0.68 0.84 0.66 0.66  CALCIUM 8.3* 8.1* 8.3* 8.0* 8.1*  MG 1.7  --  1.8 1.9 2.0  PHOS  --   --  2.8 2.1* 2.3*   GFR: Estimated Creatinine Clearance: 88.9 mL/min (by C-G formula based on SCr of 0.66 mg/dL). Liver Function Tests: No results for input(s): "AST", "ALT", "ALKPHOS", "BILITOT", "PROT", "ALBUMIN " in the last 168 hours.  No results for input(s): "LIPASE", "AMYLASE" in the last 168 hours. Recent Labs  Lab 02/11/24 0830  AMMONIA 19   Coagulation Profile: No results for input(s): "INR", "PROTIME" in the last 168 hours.  Cardiac Enzymes: No results for input(s): "CKTOTAL", "CKMB", "CKMBINDEX", "TROPONINI" in the last 168 hours. BNP (last 3 results) No results for input(s): "PROBNP" in the last 8760 hours. HbA1C: No results for input(s): "HGBA1C" in the last 72 hours. CBG: Recent Labs  Lab 02/14/24 1552 02/14/24 1941 02/14/24 2330 02/15/24 0350 02/15/24 0728  GLUCAP 109* 103* 111* 116* 115*   Lipid Profile: No results for input(s): "CHOL", "HDL", "LDLCALC", "TRIG", "CHOLHDL", "LDLDIRECT" in the last 72 hours. Thyroid Function Tests: No results for input(s): "TSH", "T4TOTAL", "FREET4", "T3FREE", "THYROIDAB" in the last 72 hours. Anemia  Panel: No results for input(s): "VITAMINB12", "FOLATE", "FERRITIN", "TIBC", "IRON", "RETICCTPCT" in the last 72 hours.  Urine analysis:    Component Value Date/Time   COLORURINE YELLOW 02/05/2024 1826   APPEARANCEUR CLEAR 02/05/2024 1826   LABSPEC 1.005 02/05/2024 1826   PHURINE 6.0 02/05/2024 1826   GLUCOSEU NEGATIVE 02/05/2024 1826   HGBUR NEGATIVE 02/05/2024 1826   BILIRUBINUR NEGATIVE 02/05/2024 1826   KETONESUR NEGATIVE 02/05/2024 1826   PROTEINUR NEGATIVE 02/05/2024 1826   NITRITE NEGATIVE 02/05/2024 1826   LEUKOCYTESUR NEGATIVE 02/05/2024 1826   Sepsis Labs: @LABRCNTIP (procalcitonin:4,lacticidven:4)  ) Recent Results (from the past 240 hours)  Culture, blood (routine x 2)     Status: None   Collection Time: 02/05/24  4:52 PM   Specimen: BLOOD  Result Value Ref Range Status   Specimen Description BLOOD SITE NOT SPECIFIED  Final   Special Requests   Final    BOTTLES DRAWN AEROBIC AND ANAEROBIC Blood Culture results may not be optimal due to an inadequate volume of blood received in culture bottles   Culture   Final    NO GROWTH 5 DAYS Performed at [facility], [address] [place], [place] [address]    Report Status 02/10/2024 FINAL  Final  MRSA Next Gen by PCR, Nasal  Status: None   Collection Time: 02/06/24  2:39 AM   Specimen: Nasal Mucosa; Nasal Swab  Result Value Ref Range Status   MRSA by PCR Next Gen NOT DETECTED NOT DETECTED Final    Comment: (NOTE) The GeneXpert MRSA Assay (FDA approved for NASAL specimens only), is one component of a comprehensive MRSA colonization surveillance program. It is not intended to diagnose MRSA infection nor to guide or monitor treatment for MRSA infections. Test performance is not FDA approved in patients less than 62 years old. Performed at [facility], [address], [place], [place] [address]   Culture, blood (routine x 2)     Status: None   Collection Time: 02/06/24  3:44 AM   Specimen: BLOOD RIGHT  ARM  Result Value Ref Range Status   Specimen Description BLOOD RIGHT ARM  Final   Special Requests   Final    BOTTLES DRAWN AEROBIC AND ANAEROBIC Blood Culture adequate volume   Culture   Final    NO GROWTH 5 DAYS Performed at [facility], [address] [place], [place] [address]    Report Status 02/11/2024 FINAL  Final     Radiology Studies: No results found.    Scheduled Meds:  Chlorhexidine  Gluconate Cloth  6 each Topical Daily   famotidine  40 mg Per Tube BID   feeding supplement (PROSource TF20)  60 mL Per Tube Daily   folic acid   1 mg Per Tube Daily   free water  200 mL Per Tube Q6H   midodrine   10 mg Per Tube BID WC   multivitamin with minerals  1 tablet Per Tube Daily   QUEtiapine  25 mg Oral QHS   senna-docusate  1 tablet Per Tube BID   thiamine   100 mg Per Tube Q24H   Continuous Infusions:  feeding supplement (OSMOLITE 1.5 CAL) 55 mL/hr at 02/15/24 0457     LOS: 10 days    Time spent:    Deforest Fast, MD [facility]   02/15/2024, 11:30 AM

## 2024-02-16 ENCOUNTER — Inpatient Hospital Stay (HOSPITAL_COMMUNITY)

## 2024-02-16 DIAGNOSIS — J189 Pneumonia, unspecified organism: Secondary | ICD-10-CM | POA: Diagnosis not present

## 2024-02-16 LAB — GLUCOSE, CAPILLARY
Glucose-Capillary: 100 mg/dL — ABNORMAL HIGH (ref 70–99)
Glucose-Capillary: 104 mg/dL — ABNORMAL HIGH (ref 70–99)
Glucose-Capillary: 113 mg/dL — ABNORMAL HIGH (ref 70–99)
Glucose-Capillary: 115 mg/dL — ABNORMAL HIGH (ref 70–99)
Glucose-Capillary: 90 mg/dL (ref 70–99)

## 2024-02-16 LAB — BASIC METABOLIC PANEL WITH GFR
Anion gap: 3 — ABNORMAL LOW (ref 5–15)
BUN: 13 mg/dL (ref 8–23)
CO2: 26 mmol/L (ref 22–32)
Calcium: 7.8 mg/dL — ABNORMAL LOW (ref 8.9–10.3)
Chloride: 106 mmol/L (ref 98–111)
Creatinine, Ser: 0.57 mg/dL — ABNORMAL LOW (ref 0.61–1.24)
GFR, Estimated: >60 mL/min (ref >60)
Glucose, Bld: 117 mg/dL — ABNORMAL HIGH (ref 70–99)
Potassium: 4 mmol/L (ref 3.5–5.1)
Sodium: 135 mmol/L (ref 135–145)

## 2024-02-16 LAB — CBC
HCT: 21.7 % — ABNORMAL LOW (ref 39.0–52.0)
Hemoglobin: 7.1 g/dL — ABNORMAL LOW (ref 13.0–17.0)
MCH: 35.5 pg — ABNORMAL HIGH (ref 26.0–34.0)
MCHC: 32.7 g/dL (ref 30.0–36.0)
MCV: 108.5 fL — ABNORMAL HIGH (ref 80.0–100.0)
Platelets: 244 10*3/uL (ref 150–400)
RBC: 2 MIL/uL — ABNORMAL LOW (ref 4.22–5.81)
RDW: 18.4 % — ABNORMAL HIGH (ref 11.5–15.5)
WBC: 5.1 10*3/uL (ref 4.0–10.5)
nRBC: 0 % (ref 0.0–0.2)

## 2024-02-16 MED ORDER — FREE WATER
200.0000 mL | Freq: Three times a day (TID) | Status: DC
  Administered 2024-02-16 – 2024-02-19 (×7): 200 mL

## 2024-02-16 MED ORDER — SENNOSIDES-DOCUSATE SODIUM 8.6-50 MG PO TABS: 1.0000 | ORAL_TABLET | Freq: Every evening | ORAL | Status: DC | PRN

## 2024-02-16 MED ORDER — FUROSEMIDE 10 MG/ML IJ SOLN
40.0000 mg | Freq: Two times a day (BID) | INTRAMUSCULAR | Status: DC
  Administered 2024-02-16 – 2024-02-18 (×4): 40 mg via INTRAVENOUS
  Filled 2024-02-16 (×4): qty 4

## 2024-02-16 MED ORDER — IOHEXOL 9 MG/ML PO SOLN
100.0000 mL | Freq: Once | ORAL | Status: AC
  Administered 2024-02-16: 100 mL

## 2024-02-16 NOTE — Progress Notes (Signed)
 Physical Therapy Treatment Patient Details Name: Kyle Montoya MRN: [id number] DOB: 08-29-1951 Today's Date: 02/16/2024   History of Present Illness 73 y.o. male presents to [facility] 02/05/24 with generalized weakness and LE edema. Admitted to ICU (5/15-5/18) with septic shock due to RLL PNA 2/2 aspiration and hypotension. 5/16 withdraw from alcohol, CIWA protocol. Complicated with heart failure. PMHx:HFpEF, alcohol abuse, cervical DDD,Gout, Asthma, COPD, Hyponatremia, Pneumonia, HTN, Anemia, Ascending Aortic Aneurysm, Gastric Ulcer disease with H. Pylori, paroxysmal Atrial Fibrillation, Lung Cancer stage IA 2016 s/p resection, CML diagnosed 2015, Cholangiocarcinoma, B12 defeciency, Alcoholic Hepatitis    PT Comments  Pt resting in bed on arrival and agreeable to session with good progress towards acute goals. Pt progressing transfers this session, able to come to stand from slightly elevated EOB without physical assist and CGA for safety and lead management. Pt progressing activity tolerance, demonstrating hallway gait with RW fro support with grossly CGA, however needing min A to steady as fatigue increased. Pt educated on importance of continued mobility with pt verbalizing understanding and agreeable to time up in chair at end of session. Pt continues to benefit from skilled PT services to progress toward functional mobility goals.     If plan is discharge home, recommend the following: A lot of help with walking and/or transfers;A lot of help with bathing/dressing/bathroom;Assistance with cooking/housework;Assist for transportation;Help with stairs or ramp for entrance   Can travel by private vehicle     Yes  Equipment Recommendations  None recommended by PT    Recommendations for Other Services       Precautions / Restrictions Precautions Precautions: Fall Recall of Precautions/Restrictions: Impaired Precaution/Restrictions Comments: cortrak Restrictions Weight Bearing Restrictions Per  Provider Order: No     Mobility  Bed Mobility Overal bed mobility: Needs Assistance Bed Mobility: Supine to Sit     Supine to sit: Contact guard     General bed mobility comments: CGA for safety    Transfers Overall transfer level: Needs assistance Equipment used: Rolling walker (2 wheels) Transfers: Sit to/from Stand Sit to Stand: Contact guard assist, From elevated surface           General transfer comment: CGA from slightly elevated EOB    Ambulation/Gait Ambulation/Gait assistance: Min assist, Contact guard assist Gait Distance (Feet): 150 Feet Assistive device: Rolling walker (2 wheels) Gait Pattern/deviations: Step-through pattern, Decreased stride length, Shuffle Gait velocity: decr     General Gait Details: grossly CGA for safety with min A needed to steady last 30' as pt with increased decreased LE clearance and trunk flexion with fatigue Cues for sequencing when turning   Stairs             Wheelchair Mobility     Tilt Bed    Modified Rankin (Stroke Patients Only)       Balance Overall balance assessment: Needs assistance, Mild deficits observed, not formally tested Sitting-balance support: No upper extremity supported, Feet supported Sitting balance-Leahy Scale: Good     Standing balance support: Bilateral upper extremity supported, During functional activity, Reliant on assistive device for balance Standing balance-Leahy Scale: Poor Standing balance comment: reliant on RW                            Communication Communication Communication: Impaired Factors Affecting Communication: Reduced clarity of speech  Cognition Arousal: Alert Behavior During Therapy: WFL for tasks assessed/performed, Restless   PT - Cognitive impairments: No family/caregiver present to determine baseline, Sequencing, Problem  solving, Safety/Judgement                         Following commands: Impaired Following commands impaired:  Follows multi-step commands inconsistently    Cueing Cueing Techniques: Verbal cues, Tactile cues  Exercises      General Comments General comments (skin integrity, edema, etc.): VSS on RA      Pertinent Vitals/Pain Pain Assessment Pain Assessment: No/denies pain    Home Living                          Prior Function            PT Goals (current goals can now be found in the care plan section) Acute Rehab PT Goals Patient Stated Goal: to get better PT Goal Formulation: With patient Time For Goal Achievement: 02/26/24 Progress towards PT goals: Progressing toward goals    Frequency    Min 2X/week      PT Plan      Co-evaluation              AM-PAC PT "6 Clicks" Mobility   Outcome Measure  Help needed turning from your back to your side while in a flat bed without using bedrails?: A Little Help needed moving from lying on your back to sitting on the side of a flat bed without using bedrails?: A Little Help needed moving to and from a bed to a chair (including a wheelchair)?: A Little Help needed standing up from a chair using your arms (e.g., wheelchair or bedside chair)?: A Little Help needed to walk in hospital room?: A Little Help needed climbing 3-5 steps with a railing? : A Lot 6 Click Score: 17    End of Session Equipment Utilized During Treatment: Gait belt Activity Tolerance: Patient tolerated treatment well Patient left: with call bell/phone within reach;in chair Nurse Communication: Mobility status PT Visit Diagnosis: Unsteadiness on feet (R26.81);Other abnormalities of gait and mobility (R26.89);Muscle weakness (generalized) (M62.81)     Time: 1010-1034 PT Time Calculation (min) (ACUTE ONLY): 24 min  Charges:    $Gait Training: 8-22 mins $Therapeutic Activity: 8-22 mins PT General Charges $$ ACUTE PT VISIT: 1 Visit                     Mykel Mohl R. PTA Acute Rehabilitation Services Office: [phone]   Agapito Horseman 02/16/2024, 12:17 PM

## 2024-02-16 NOTE — Progress Notes (Signed)
 PROGRESS NOTE    Kyle Montoya  XBM:[id number] DOB: July 23, 1951 DOA: 02/05/2024 PCP: Teofilo Fellers, NP  73/M w diastolic CHF, COPD, EtOH abuse, history of multiple cancers namely lung cancer sp resection and cholangiocarcinoma presented to the ED with generalized weakness and lower extremity edema. In the ER he was hypotensive with lower extremity edema, labs noted hyponatremia, creatinine 1.5, mildly elevated LFTs, BNP 149, troponin 56, lactic acid 1.9, chest x-ray with pulmonary vascular congestion, pleural effusion -Fluid resuscitated, also treated with antibiotics and midodrine   - 5/16 developed EtOH withdrawal, started on phenobarbital , continued vasopressor support  - 5/17 weaned off vasopressors  -5/18 transfer to [facility].  -5/19 clinically improving, delirium, required mittens -5/20 noted to have dysphagia and not able to have enteral po nutrition. Ordered cortrak - 5/21: Cortrak placed, tube feeds started, delirious - 5/22: Palliative care consulted, goals of care discussions ongoing - 5/25, repeat family meeting, decision made to proceed with PEG   Subjective: - Feels okay, no events overnight, tolerating tube feeds  Assessment and Plan:  Septic shock, resolved Right lower lobe/aspiration pneumonia -Completed antibiotic course - Dysphagia, see below  Severe dysphagia - On swallowing evaluation he was noted to have large flowing anterior osteophytes C2-C5 producing obstruction to his swallowing.  - Despite being awake, continued to do poorly with swallowing assessment, discussed with daughter, cortrak placed 5/21, now on tube feeds, requested palliative care evaluation on account of ongoing issues with delirium, numerous comorbidities etc. and facilitate further discussion regarding PEG tube - Appreciate palliative assistance, goals of care discussions ongoing, after much deliberation, back-and-forth discussions patient and family confirmed decision yesterday to proceed with PEG  tube - IR consulted  Acute on chronic diastolic CHF (congestive heart failure) (HCC) Echocardiogram with preserved LV systolic function EF 65 to 70%, RV systolic function preserved, no significant valvular disease.  - Cut down midodrine  - Restart diuretics  Iron deficiency anemia - Given IV iron - Is not in clinical condition that is appropriate for further evaluation at this time, recommend GI follow-up - Hemoglobin is down to 7.1, suspect hemodilution contributing as well, monitor with diuresis, may need transfusion if it drops further  Alcohol withdrawal (HCC) - Resolved, now off phenobarbital  Continue thiamine  and multivitamins   Delirium, agitation - Intermittent delirium, overall improving now, continue Qpm Seroquel - Haldol as needed  HTN (hypertension) Continue midodrine  for blood pressure control.    AKI (acute kidney injury) (HCC) Hyponatremia. hypokalemia - Resolved  CAD (coronary artery disease) No chest pain, no acute coronary syndrome   COPD with asthma (HCC) Stable Continue with bronchodilator therapy   Type 2 diabetes mellitus with hyperlipidemia (HCC) Stable, hold off on insulin   Ascending aortic aneurysm (HCC) Follow up as outpatient   Carcinoma of biliary tract (HCC) Follow up as outpatient.     DVT prophylaxis: SCDs Code Status: DNR Family Communication: none present, called and updated daughter 5/23 Disposition Plan: TBD  Consultants:    Procedures:   Antimicrobials:    Objective: Vitals:   02/15/24 2026 02/16/24 0007 02/16/24 0422 02/16/24 0821  BP: 106/69 122/71  116/88  Pulse: 72 65 62 67  Resp: 20 17    Temp: 98 F (36.7 C) 97.8 F (36.6 C) 97.6 F (36.4 C) (!) 97.3 F (36.3 C)  TempSrc: Oral Oral Oral Oral  SpO2: 99% 98%  95%  Weight:   88.6 kg   Height:        Intake/Output Summary (Last 24 hours) at 02/16/2024 1105 Last data  filed at 02/16/2024 0600 Gross per 24 hour  Intake 542.42 ml  Output 800 ml  Net  -257.58 ml   Filed Weights   02/13/24 0545 02/14/24 0309 02/16/24 0422  Weight: 84.5 kg 84.6 kg 88.6 kg    Examination:  General exam: Chronically ill elderly male lying in bed, AO x 2, mild cognitive deficits HEENT: _ JVD,  cortrak noted VS: S1-S2, regular rhythm Lungs: Decreased breath sounds at the bases Abdomen: Soft, nontender, bowel sounds present EXTR: No edema  psych: Flat affect  Data Reviewed:   CBC: Recent Labs  Lab 02/11/24 0249 02/12/24 0235 02/13/24 0356 02/14/24 0532 02/16/24 0219  WBC 3.2* 3.8* 3.9* 6.3 5.1  HGB 7.5* 7.4* 7.3* 7.4* 7.1*  HCT 22.1* 21.5* 21.8* 22.1* 21.7*  MCV 103.8* 105.4* 107.4* 108.9* 108.5*  PLT 208 239 247 263 244   Basic Metabolic Panel: Recent Labs  Lab 02/10/24 0315 02/11/24 0249 02/12/24 0235 02/13/24 0356 02/14/24 0245 02/16/24 0219  NA 134* 137 138 138 138 135  K 4.1 3.6 3.9 3.6 4.3 4.0  CL 102 105 108 109 108 106  CO2 21* 23 22 24 22 26   GLUCOSE 97 76 119* 110* 92 117*  BUN 13 9 11 10 12 13   CREATININE 0.80 0.68 0.84 0.66 0.66 0.57*  CALCIUM 8.3* 8.1* 8.3* 8.0* 8.1* 7.8*  MG 1.7  --  1.8 1.9 2.0  --   PHOS  --   --  2.8 2.1* 2.3*  --    GFR: Estimated Creatinine Clearance: 88.9 mL/min (A) (by C-G formula based on SCr of 0.57 mg/dL (L)). Liver Function Tests: No results for input(s): "AST", "ALT", "ALKPHOS", "BILITOT", "PROT", "ALBUMIN " in the last 168 hours.  No results for input(s): "LIPASE", "AMYLASE" in the last 168 hours. Recent Labs  Lab 02/11/24 0830  AMMONIA 19   Coagulation Profile: No results for input(s): "INR", "PROTIME" in the last 168 hours.  Cardiac Enzymes: No results for input(s): "CKTOTAL", "CKMB", "CKMBINDEX", "TROPONINI" in the last 168 hours. BNP (last 3 results) No results for input(s): "PROBNP" in the last 8760 hours. HbA1C: No results for input(s): "HGBA1C" in the last 72 hours. CBG: Recent Labs  Lab 02/15/24 1129 02/15/24 1620 02/15/24 2029 02/16/24 0009 02/16/24 0427   GLUCAP 107* 84 111* 115* 113*   Lipid Profile: No results for input(s): "CHOL", "HDL", "LDLCALC", "TRIG", "CHOLHDL", "LDLDIRECT" in the last 72 hours. Thyroid Function Tests: No results for input(s): "TSH", "T4TOTAL", "FREET4", "T3FREE", "THYROIDAB" in the last 72 hours. Anemia Panel: No results for input(s): "VITAMINB12", "FOLATE", "FERRITIN", "TIBC", "IRON", "RETICCTPCT" in the last 72 hours.  Urine analysis:    Component Value Date/Time   COLORURINE YELLOW 02/05/2024 1826   APPEARANCEUR CLEAR 02/05/2024 1826   LABSPEC 1.005 02/05/2024 1826   PHURINE 6.0 02/05/2024 1826   GLUCOSEU NEGATIVE 02/05/2024 1826   HGBUR NEGATIVE 02/05/2024 1826   BILIRUBINUR NEGATIVE 02/05/2024 1826   KETONESUR NEGATIVE 02/05/2024 1826   PROTEINUR NEGATIVE 02/05/2024 1826   NITRITE NEGATIVE 02/05/2024 1826   LEUKOCYTESUR NEGATIVE 02/05/2024 1826   Sepsis Labs: @LABRCNTIP (procalcitonin:4,lacticidven:4)  ) No results found for this or any previous visit (from the past 240 hours).    Radiology Studies: CT ABDOMEN WO CONTRAST Result Date: 02/16/2024 CLINICAL DATA:  Dysphagia EXAM: CT ABDOMEN WITHOUT CONTRAST TECHNIQUE: Multidetector CT imaging of the abdomen was performed following the standard protocol without IV contrast. RADIATION DOSE REDUCTION: This exam was performed according to the departmental dose-optimization program which includes automated exposure control, adjustment of  the mA and/or kV according to patient size and/or use of iterative reconstruction technique. COMPARISON:  None Available. FINDINGS: Lower chest: Large bilateral pleural effusions. Volume loss at the bases above the effusions. Small pericardial effusion. Hepatobiliary: No focal liver abnormality is seen. Status post cholecystectomy. No biliary dilatation. Pancreas: Unremarkable. No pancreatic ductal dilatation or surrounding inflammatory changes. Spleen: Normal in size without focal abnormality. Adrenals/Urinary Tract: Adrenal  glands are unremarkable. Kidneys are normal, without renal calculi, focal lesion, or hydronephrosis. Stomach/Bowel: Feeding tube tip is in the stomach. Vascular/Lymphatic: Aortic atherosclerosis. No enlarged abdominal lymph nodes. Other: No abdominal wall hernia or abnormality. Musculoskeletal: No acute or significant osseous findings. IMPRESSION: 1. Large bilateral pleural effusions. 2. Small pericardial effusion. 3. No acute abdominal pathology identified. 4. Aortic atherosclerosis (ICD10-I70.0). Electronically Signed   By: Sydell Eva M.D.   On: 02/16/2024 08:17      Scheduled Meds:  Chlorhexidine  Gluconate Cloth  6 each Topical Daily   famotidine  40 mg Per Tube BID   feeding supplement (PROSource TF20)  60 mL Per Tube Daily   folic acid   1 mg Per Tube Daily   free water  200 mL Per Tube Q6H   iohexol   100 mL Per Tube Once   midodrine   10 mg Per Tube BID WC   multivitamin with minerals  1 tablet Per Tube Daily   QUEtiapine  25 mg Oral QHS   senna-docusate  1 tablet Per Tube BID   tamsulosin   0.8 mg Oral Daily   thiamine   100 mg Per Tube Q24H   Continuous Infusions:  feeding supplement (OSMOLITE 1.5 CAL) 55 mL/hr at 02/15/24 1619     LOS: 11 days    Time spent:    Deforest Fast, MD [facility]   02/16/2024, 11:05 AM

## 2024-02-16 NOTE — TOC Progression Note (Signed)
 Transition of Care [facility]) - Progression Note    Patient Details  Name: Kyle Montoya MRN: [id number] Date of Birth: May 12, 1951  [facility]) CM/SW Contact  Arron Big, [place] Phone Number: 02/16/2024, 1:29 PM  Clinical Narrative:   CSW provided patient with medicare.gov rating for accepting SNFs. Patient stated he would like to go to [facility] on [address], CSW explained that [facility] denied at this time. Patient chose [facility]. CSW notified patients daughter and facility admissions. Facility will potentially need to submit for insurance auth if utilizing patients BCBS COMM PPO insurance.   TOC will continue to follow.    Expected Discharge Plan: Skilled Nursing Facility Barriers to Discharge: Continued Medical Work up, English as a second language teacher, Other (must enter comment) (Facility will need to start [place])  Expected Discharge Plan and Services In-house Referral: Clinical Social Work   Post Acute Care Choice: Skilled Nursing Facility Living arrangements for the past 2 months: Single Family Home                                       Social Determinants of Health (SDOH) Interventions SDOH Screenings   Food Insecurity: No Food Insecurity (02/06/2024)  Housing: Low Risk  (02/06/2024)  Transportation Needs: No Transportation Needs (02/06/2024)  Utilities: Not At Risk (02/06/2024)  Social Connections: Moderately Isolated (02/06/2024)  Tobacco Use: Medium Risk (02/05/2024)    Readmission Risk Interventions    12/08/2023   10:22 AM 12/03/2023   10:44 AM 10/09/2023    3:26 PM  Readmission Risk Prevention Plan  Transportation Screening  Complete Complete  PCP or Specialist Appt within 5-7 Days   Complete  PCP or Specialist Appt within 3-5 Days Complete    Home Care Screening   Complete  Medication Review (RN CM)   Complete  HRI or Home Care Consult Complete Complete   Social Work Consult for Recovery Care Planning/Counseling  Complete   Palliative Care  Screening Complete Not Applicable   Medication Review Oceanographer) Complete Complete

## 2024-02-17 ENCOUNTER — Inpatient Hospital Stay (HOSPITAL_COMMUNITY)

## 2024-02-17 ENCOUNTER — Inpatient Hospital Stay: Admitting: Oncology

## 2024-02-17 ENCOUNTER — Encounter (HOSPITAL_COMMUNITY): Payer: Self-pay

## 2024-02-17 DIAGNOSIS — J189 Pneumonia, unspecified organism: Secondary | ICD-10-CM | POA: Diagnosis not present

## 2024-02-17 HISTORY — PX: IR GASTROSTOMY TUBE MOD SED: IMG625

## 2024-02-17 LAB — BASIC METABOLIC PANEL WITH GFR
Anion gap: 7 (ref 5–15)
BUN: 11 mg/dL (ref 8–23)
CO2: 23 mmol/L (ref 22–32)
Calcium: 7.8 mg/dL — ABNORMAL LOW (ref 8.9–10.3)
Chloride: 105 mmol/L (ref 98–111)
Creatinine, Ser: 0.52 mg/dL — ABNORMAL LOW (ref 0.61–1.24)
GFR, Estimated: >60 mL/min (ref >60)
Glucose, Bld: 90 mg/dL (ref 70–99)
Potassium: 3.8 mmol/L (ref 3.5–5.1)
Sodium: 135 mmol/L (ref 135–145)

## 2024-02-17 LAB — CBC
HCT: 21.5 % — ABNORMAL LOW (ref 39.0–52.0)
Hemoglobin: 7 g/dL — ABNORMAL LOW (ref 13.0–17.0)
MCH: 35 pg — ABNORMAL HIGH (ref 26.0–34.0)
MCHC: 32.6 g/dL (ref 30.0–36.0)
MCV: 107.5 fL — ABNORMAL HIGH (ref 80.0–100.0)
Platelets: 242 10*3/uL (ref 150–400)
RBC: 2 MIL/uL — ABNORMAL LOW (ref 4.22–5.81)
RDW: 17.9 % — ABNORMAL HIGH (ref 11.5–15.5)
WBC: 3.9 10*3/uL — ABNORMAL LOW (ref 4.0–10.5)
nRBC: 0 % (ref 0.0–0.2)

## 2024-02-17 LAB — PROTIME-INR
INR: 1.1 (ref 0.8–1.2)
Prothrombin Time: 13.9 s (ref 11.4–15.2)

## 2024-02-17 LAB — GLUCOSE, CAPILLARY
Glucose-Capillary: 112 mg/dL — ABNORMAL HIGH (ref 70–99)
Glucose-Capillary: 75 mg/dL (ref 70–99)
Glucose-Capillary: 84 mg/dL (ref 70–99)
Glucose-Capillary: 87 mg/dL (ref 70–99)
Glucose-Capillary: 91 mg/dL (ref 70–99)
Glucose-Capillary: 95 mg/dL (ref 70–99)

## 2024-02-17 MED ORDER — INSULIN ASPART 100 UNIT/ML IJ SOLN: 0.0000 [IU] | Freq: Four times a day (QID) | INTRAMUSCULAR | Status: DC

## 2024-02-17 MED ORDER — CEFAZOLIN SODIUM-DEXTROSE 2-4 GM/100ML-% IV SOLN
INTRAVENOUS | Status: AC
Start: 2024-02-17 — End: ?
  Filled 2024-02-17: qty 100

## 2024-02-17 MED ORDER — LIDOCAINE-EPINEPHRINE 1 %-1:100000 IJ SOLN
20.0000 mL | Freq: Once | INTRAMUSCULAR | Status: AC
  Administered 2024-02-17: 20 mL via INTRADERMAL
  Filled 2024-02-17: qty 20

## 2024-02-17 MED ORDER — MIDAZOLAM HCL 2 MG/2ML IJ SOLN
INTRAMUSCULAR | Status: AC
  Filled 2024-02-17: qty 2

## 2024-02-17 MED ORDER — MORPHINE SULFATE (PF) 2 MG/ML IV SOLN
1.0000 mg | INTRAVENOUS | Status: DC | PRN
  Administered 2024-02-17 – 2024-02-19 (×3): 1 mg via INTRAVENOUS
  Filled 2024-02-17 (×3): qty 1

## 2024-02-17 MED ORDER — IOHEXOL 300 MG/ML  SOLN
50.0000 mL | Freq: Once | INTRAMUSCULAR | Status: AC | PRN
  Administered 2024-02-17: 20 mL via INTRA_ARTERIAL

## 2024-02-17 MED ORDER — FENTANYL CITRATE (PF) 100 MCG/2ML IJ SOLN
INTRAMUSCULAR | Status: AC | PRN
  Administered 2024-02-17: 25 ug via INTRAVENOUS

## 2024-02-17 MED ORDER — GLUCAGON HCL RDNA (DIAGNOSTIC) 1 MG IJ SOLR
INTRAMUSCULAR | Status: AC
  Filled 2024-02-17: qty 1

## 2024-02-17 MED ORDER — INSULIN ASPART 100 UNIT/ML IJ SOLN: 0.0000 [IU] | Freq: Four times a day (QID) | INTRAMUSCULAR | Status: DC | PRN

## 2024-02-17 MED ORDER — DEXTROSE IN LACTATED RINGERS 5 % IV SOLN: INTRAVENOUS | Status: DC

## 2024-02-17 MED ORDER — MIDAZOLAM HCL 5 MG/5ML IJ SOLN
INTRAMUSCULAR | Status: AC | PRN
  Administered 2024-02-17: 1 mg via INTRAVENOUS

## 2024-02-17 MED ORDER — GLUCAGON HCL RDNA (DIAGNOSTIC) 1 MG IJ SOLR
INTRAMUSCULAR | Status: AC | PRN
  Administered 2024-02-17: .5 mg via INTRAVENOUS

## 2024-02-17 MED ORDER — MIDODRINE HCL 5 MG PO TABS
5.0000 mg | ORAL_TABLET | Freq: Two times a day (BID) | ORAL | Status: DC
  Administered 2024-02-17 – 2024-02-19 (×4): 5 mg
  Filled 2024-02-17 (×4): qty 1

## 2024-02-17 MED ORDER — LIDOCAINE-EPINEPHRINE 1 %-1:100000 IJ SOLN
INTRAMUSCULAR | Status: AC
  Filled 2024-02-17: qty 1

## 2024-02-17 MED ORDER — CEFAZOLIN SODIUM-DEXTROSE 2-4 GM/100ML-% IV SOLN
INTRAVENOUS | Status: AC | PRN
  Administered 2024-02-17: 2 g via INTRAVENOUS

## 2024-02-17 MED ORDER — CEFAZOLIN SODIUM-DEXTROSE 2-4 GM/100ML-% IV SOLN: 2.0000 g | INTRAVENOUS | Status: DC

## 2024-02-17 MED ORDER — FENTANYL CITRATE (PF) 100 MCG/2ML IJ SOLN
INTRAMUSCULAR | Status: AC
  Filled 2024-02-17: qty 2

## 2024-02-17 NOTE — Consult Note (Signed)
 Chief Complaint: Patient was seen in consultation today for poor oral intake at the request of Deforest Fast   Referring Physician(s): Deforest Fast   Supervising Physician: Elene Griffes  Patient Status: [facility] - In-pt  History of Present Illness: Kyle Montoya is a 73 y.o. male with PMHs of CHF, COPD, EtOH abuse, history of multiple cancers namely lung cancer sp resection and cholangiocarcinoma, admitted due to septic shock which now resolved, IR was requested for G tube placement due to severe dysphagia.   Patient was admitted on 02/05/24 after found to be in septic shock as well as EtOH withdrawal which now has resolved, patient was transferred from ICU to [facility] service on 5/18. Patient has been seen by SLP and MBS on 5/19 showed severe dysphagia, nasogastric feeding tube was placed. Palliative care team was consulted for GOC, after meeting with the patient and his family, decision was made to proceed with G tube placement for long term enteral feed. IR was consulted, case reviewed and approved by Dr. Julietta Ogren.   Patient currently has DNR order in place. Discussion with the patient and family regarding wishes.  The original DNR order is maintained and prior treatment limitations are upheld during the procedure.  Past Medical History:  Diagnosis Date   Abnormal colonoscopy 02/23/2021   Abnormal transaminases 02/27/2022   Acute diastolic (congestive) heart failure (HCC) 10/08/2023   Adenomatous polyp of descending colon 02/23/2021   Adverse drug reaction, initial encounter 07/04/2016   07/02/2016: Prevnar 23 vaccine given in right arm  07/02/2016: localized swelling  10/11/20167: Increase in swelling from shoulder to inner aspect of elbow.Red, warm and firm to touch.  No lip or airway swelling or stridor.  2+ brachial and radial pulses right arm.  Capillary refill < 2 seconds     Anemia in chronic illness 01/22/2013   Arthritis, degenerative 01/31/2015   Ascending aortic aneurysm (HCC)  06/27/2022   Benign fibroma of prostate 01/31/2015   BP (high blood pressure) 01/22/2013   BPH (benign prostatic hyperplasia)    CAD (coronary artery disease)    Cannot sleep 01/31/2015   Carcinoma of biliary tract (HCC) 02/15/2013   Diagnosed in 2014, Pt had whipple done at [facility], under care by Dr. Kirk Peper Oncologist, has f/u every 6-12 months.   Cataract    bilateral sx   Cervical osteoarthritis 10/18/2014   Cholangiocarcinoma of biliary tract (HCC)    Chronic cough 01/31/2015   Chronic myelogenous leukemia (HCC)    Chronic pain 01/31/2015   CML (chronic myelocytic leukemia) (HCC) 10/18/2014   Overview:   Follows with [facility], Dr. Almer Jacobson. On dasatinib , last WBC 9.6.     Compulsive tobacco user syndrome 01/31/2015   Congestive heart failure (CHF) (HCC) 09/26/2023   COPD (chronic obstructive pulmonary disease) (HCC)    uses inhaler   COPD with asthma (HCC) 01/22/2013   Severe obstruction on spirometry FeV1 54% FeV1/FVC 51% 01/31/2015  CXR NAD 01/25/15     Diuretic-induced hypokalemia 05/23/2023   DJD (degenerative joint disease)    Dyslipidemia    Family history of colonic polyps    Family history of prostate cancer    Full thickness rotator cuff tear 08/08/2020   Gout    High coronary artery calcium score 05/30/2023   History of colon polyps 02/23/2021   History of colonic polyps 10/01/2021   IMO SNOMED Dx Update Oct 2024     HLD (hyperlipidemia) 01/31/2015   HTN (hypertension)    on meds  Hypercalcemia 08/10/2020   Hypercholesterolemia    Hypocalcemia 08/27/2021   Hyponatremia 09/19/2023   Impingement syndrome of both shoulders 03/31/2020   Lung cancer (HCC)    Myelogenous leukemia (HCC)    Non-small cell carcinoma of lung (HCC) 01/31/2015   02/13/2015 CT Chest no evidence of recurrence in lung cancer  Stage I adenocarcinoma diagnosed with left upper lobectomy in February 2016     OA (osteoarthritis)    Orthostatic hypotension 10/10/2023   PAD  (peripheral artery disease) (HCC)    Personal history of gastric ulcers 02/23/2021   Renal insufficiency    Shoulder pain, left 03/29/2020   Shoulder pain, right 03/29/2020   Tendonitis    patellar   Thoracic aortic aneurysm (HCC) 06/27/2022   Wheezing 01/31/2015    Past Surgical History:  Procedure Laterality Date   BIOPSY  04/30/2021   Procedure: BIOPSY;  Surgeon: Normie Becton., MD;  Location: [facility];  Service: Gastroenterology;;   Bowel duct surg  2014   Bowel cancer   CARDIAC CATHETERIZATION  2000   CHOLECYSTECTOMY     COLONOSCOPY  06/23/2015   mild sigmoid diverticulosis. small external and internal hemorrhoids. otherwise normal colonoscopy to terminal ileum   COLONOSCOPY WITH PROPOFOL  N/A 04/30/2021   Procedure: COLONOSCOPY WITH PROPOFOL ;  Surgeon: Brice Campi Albino Alu., MD;  Location: [facility];  Service: Gastroenterology;  Laterality: N/A;   ENDOSCOPIC MUCOSAL RESECTION N/A 04/30/2021   Procedure: ENDOSCOPIC MUCOSAL RESECTION;  Surgeon: Brice Campi Albino Alu., MD;  Location: [facility];  Service: Gastroenterology;  Laterality: N/A;   ESOPHAGOGASTRODUODENOSCOPY (EGD) WITH PROPOFOL  N/A 04/30/2021   Procedure: ESOPHAGOGASTRODUODENOSCOPY (EGD) WITH PROPOFOL ;  Surgeon: Brice Campi Albino Alu., MD;  Location: [facility];  Service: Gastroenterology;  Laterality: N/A;   HEMOSTASIS CLIP PLACEMENT  04/30/2021   Procedure: HEMOSTASIS CLIP PLACEMENT;  Surgeon: Normie Becton., MD;  Location: [facility];  Service: Gastroenterology;;   LUNG CANCER SURGERY  09/2014   partial lobectomy   POLYPECTOMY  04/30/2021   Procedure: POLYPECTOMY;  Surgeon: Mansouraty, Albino Alu., MD;  Location: [facility];  Service: Gastroenterology;;   SHOULDER OPEN ROTATOR CUFF REPAIR Right 09/27/2020   Procedure: Right shoulder mini open rotator cuff repair, distal clavicle resection;  Surgeon: Orvan Blanch, MD;  Location: [facility] OR[facility];  Service: Orthopedics;  Laterality: Right;   90 mins  Choice with Block anesthesia   SUBMUCOSAL LIFTING INJECTION  04/30/2021   Procedure: SUBMUCOSAL LIFTING INJECTION;  Surgeon: Normie Becton., MD;  Location: [facility];  Service: Gastroenterology;;   UPPER GASTROINTESTINAL ENDOSCOPY     WHIPPLE PROCEDURE  02/25/2013   Procedure: WHIPPLE PROCEDURE; Surgeon: Kirk Peper, MD; [facility]; Service: General; Laterality: N/A;    Allergies: Pneumococcal vaccines  Medications: Prior to Admission medications   Medication Sig Start Date End Date Taking? Authorizing Provider  albuterol  (PROVENTIL  HFA;VENTOLIN  HFA) 108 (90 BASE) MCG/ACT inhaler Inhale 2 puffs into the lungs every 6 (six) hours as needed for wheezing or shortness of breath.   Yes [provider]  allopurinol  (ZYLOPRIM ) 300 MG tablet Take 300 mg by mouth daily.   Yes [provider]  aspirin  EC 81 MG tablet Take 1 tablet (81 mg total) by mouth daily. Swallow whole. 05/30/23  Yes Krasowski, Robert J, MD  Calcium Carbonate (CALCIUM 500 PO) Take 1 tablet by mouth 3 (three) times daily. 02/27/22  Yes Mosher, Kelli A, PA-C  Cholecalciferol (VITAMIN D ) 50 MCG (2000 UT) tablet Take 2,000 Units by mouth daily.   Yes [provider]  dasatinib  (  SPRYCEL ) 100 MG tablet Take 1 tablet (100 mg total) by mouth daily. 01/13/24  Yes Nolia Baumgartner, MD  diclofenac Sodium (VOLTAREN) 1 % GEL Apply 2 g topically at bedtime as needed (Pain). 07/10/20  Yes [provider]  fluticasone  (FLONASE ) 50 MCG/ACT nasal spray Place 2 sprays into both nostrils daily.   Yes [provider]  folic acid  (FOLVITE ) 1 MG tablet Take 1 tablet (1 mg total) by mouth daily. 12/12/23  Yes Barbee Lew, MD  HYDROcodone -acetaminophen  (NORCO/VICODIN) 5-325 MG tablet Take 1 tablet by mouth 4 (four) times daily as needed for moderate pain (pain score 4-6). 09/27/23  Yes [provider]  methocarbamol  (ROBAXIN ) 750 MG tablet Take 750 mg by mouth every  6 (six) hours as needed for muscle spasms. 04/07/23  Yes [provider]  Multiple Vitamin (MULTIVITAMIN WITH MINERALS) TABS tablet Take 1 tablet by mouth daily. 12/12/23  Yes Barbee Lew, MD  omeprazole  (PRILOSEC) 40 MG capsule TAKE 1 CAPSULE (40 MG TOTAL) BY MOUTH 2 (TWO) TIMES DAILY BEFORE A MEAL. 11/21/23  Yes Lajuan Pila, MD  polyethylene glycol (MIRALAX  / GLYCOLAX ) 17 g packet Take 17 g by mouth daily as needed for moderate constipation. 12/11/23  Yes Barbee Lew, MD  tamsulosin  (FLOMAX ) 0.4 MG CAPS capsule Take 0.8 mg by mouth daily. 09/08/23  Yes [provider]  thiamine  (VITAMIN B-1) 100 MG tablet Take 1 tablet (100 mg total) by mouth daily. 12/12/23  Yes Barbee Lew, MD  TRELEGY ELLIPTA 100-62.5-25 MCG/INH AEPB Inhale 1 puff into the lungs daily at 6 (six) AM. 12/20/20  Yes [provider]  furosemide  (LASIX ) 80 MG tablet Take 80 mg by mouth 2 (two) times daily. Patient not taking: Reported on 02/05/2024 12/18/23   [provider]  irbesartan -hydrochlorothiazide  (AVALIDE) 150-12.5 MG tablet Take 1 tablet by mouth daily. Patient not taking: Reported on 02/05/2024 01/08/24   [provider]  midodrine  (PROAMATINE ) 5 MG tablet Take 3 tablets (15 mg total) by mouth 3 (three) times daily with meals. Patient not taking: Reported on 02/05/2024 12/11/23   Barbee Lew, MD  torsemide  (DEMADEX ) 20 MG tablet Take 20 mg by mouth daily. Patient not taking: Reported on 02/05/2024 02/05/24   [provider]     Family History  Problem Relation Age of Onset   Hypertension Mother    Prostate cancer Father 97       deceased   Hypertension Sister    Asthma Sister    Hypertension Brother    Cancer Paternal Grandfather    Cancer Half-Sister        unknown, dx 27   Esophageal cancer Neg Hx    Colon polyps Neg Hx    Colon cancer Neg Hx    Stomach cancer Neg Hx    Rectal cancer Neg Hx    Inflammatory bowel disease Neg Hx     Liver disease Neg Hx    Pancreatic cancer Neg Hx     Social History   Socioeconomic History   Marital status: Married    Spouse name: Not on file   Number of children: 2   Years of education: Not on file   Highest education level: Not on file  Occupational History   Occupation: environmental  Tobacco Use   Smoking status: Former    Current packs/day: 0.25    Average packs/day: 0.3 packs/day for 40.0 years (10.0 ttl pk-yrs)    Types: Cigarettes   Smokeless tobacco: Never  Vaping Use   Vaping status: Never Used  Substance and Sexual Activity   Alcohol use: Yes    Alcohol/week: 21.0 standard drinks of alcohol    Types: 21 Cans of beer per week   Drug use: Never   Sexual activity: Not on file  Other Topics Concern   Not on file  Social History Narrative   Married, 2 children.   Works moves heavy equipment x 30 yrs.   Social Drivers of Corporate investment banker Strain: Not on file  Food Insecurity: No Food Insecurity (02/06/2024)   Hunger Vital Sign    Worried About Running Out of Food in the Last Year: Never true    Ran Out of Food in the Last Year: Never true  Transportation Needs: No Transportation Needs (02/06/2024)   PRAPARE - Administrator, Civil Service (Medical): No    Lack of Transportation (Non-Medical): No  Physical Activity: Not on file  Stress: Not on file  Social Connections: Moderately Isolated (02/06/2024)   Social Connection and Isolation Panel [NHANES]    Frequency of Communication with Friends and Family: More than three times a week    Frequency of Social Gatherings with Friends and Family: Once a week    Attends Religious Services: Never    Database administrator or Organizations: Yes    Attends Banker Meetings: 1 to 4 times per year    Marital Status: Widowed     Review of Systems: A 12 point ROS discussed and pertinent positives are indicated in the HPI above.  All other systems are negative.  Vital Signs: BP  114/76   Pulse 79   Temp 97.8 F (36.6 C) (Oral)   Resp 14   Ht 6' (1.829 m)   Wt 190 lb (86.2 kg)   SpO2 99%   BMI 25.77 kg/m   Physical Exam Constitutional:      Appearance: Normal appearance.  HENT:     Head: Normocephalic and atraumatic.     Mouth/Throat:     Mouth: Mucous membranes are moist.  Cardiovascular:     Rate and Rhythm: Normal rate and regular rhythm.  Pulmonary:     Effort: Pulmonary effort is normal.  Skin:    General: Skin is warm and dry.     MD Evaluation Airway: WNL Heart: WNL Abdomen: WNL Chest/ Lungs: WNL ASA  Classification: 2 Mallampati/Airway Score: Two Imaging: DG Abd Portable 1V Result Date: 02/17/2024 CLINICAL DATA:  Poor appetite. EXAM: PORTABLE ABDOMEN - 1 VIEW COMPARISON:  Radiograph 02/11/2024 FINDINGS: Feeding tube extends into the mid stomach. Interval clearance of barium from the colon. No dilated loops of large or small bowel identified. IMPRESSION: 1. Feeding tube in stomach. 2. Nonobstructive bowel gas pattern. Electronically Signed   By: Deboraha Fallow M.D.   On: 02/17/2024 10:10   CT ABDOMEN WO CONTRAST Result Date: 02/16/2024 CLINICAL DATA:  Dysphagia EXAM: CT ABDOMEN WITHOUT CONTRAST TECHNIQUE: Multidetector CT imaging of the abdomen was performed following the standard protocol without IV contrast. RADIATION DOSE REDUCTION: This exam was performed according to the departmental dose-optimization program which includes automated exposure control, adjustment of the mA and/or kV according to patient size and/or use of iterative reconstruction technique. COMPARISON:  None Available. FINDINGS: Lower chest: Large bilateral pleural effusions. Volume loss at the bases above the effusions. Small pericardial effusion. Hepatobiliary: No focal liver abnormality is seen. Status post cholecystectomy. No biliary dilatation. Pancreas: Unremarkable. No pancreatic ductal dilatation or surrounding inflammatory  changes. Spleen: Normal in size without  focal abnormality. Adrenals/Urinary Tract: Adrenal glands are unremarkable. Kidneys are normal, without renal calculi, focal lesion, or hydronephrosis. Stomach/Bowel: Feeding tube tip is in the stomach. Vascular/Lymphatic: Aortic atherosclerosis. No enlarged abdominal lymph nodes. Other: No abdominal wall hernia or abnormality. Musculoskeletal: No acute or significant osseous findings. IMPRESSION: 1. Large bilateral pleural effusions. 2. Small pericardial effusion. 3. No acute abdominal pathology identified. 4. Aortic atherosclerosis (ICD10-I70.0). Electronically Signed   By: Sydell Eva M.D.   On: 02/16/2024 08:17   DG Abd Portable 1V Result Date: 02/11/2024 CLINICAL DATA:  Feeding tube placement. EXAM: PORTABLE ABDOMEN - 1 VIEW COMPARISON:  None Available. FINDINGS: Tip of the weighted enteric tube below the diaphragm in the midline in the region of the distal stomach. No bowel dilatation to suggest obstruction. There is barium in the colon from recent barium swallow. IMPRESSION: Tip of the weighted enteric tube below the diaphragm in the region of the distal stomach. Electronically Signed   By: Chadwick Colonel M.D.   On: 02/11/2024 15:23   ECHOCARDIOGRAM LIMITED Result Date: 02/10/2024    ECHOCARDIOGRAM LIMITED REPORT   Patient Name:   Kyle Montoya Date of Exam: 02/10/2024 Medical Rec #:  [id number]      Height:       72.0 in Accession #:    [id number]     Weight:       190.0 lb Date of Birth:  1951/08/28      BSA:          2.085 m Patient Age:    73 years       BP:           119/80 mmHg Patient Gender: M              HR:           77 bpm. Exam Location:  Inpatient Procedure: Limited Echo and Limited Color Doppler (Both Spectral and Color Flow            Doppler were utilized during procedure). Indications:    Dyspnea R06.00  History:        Patient has prior history of Echocardiogram examinations, most                 recent 12/02/2023. CHF, CAD, PAD and COPD,                  Signs/Symptoms:Hypotension; Risk Factors:Hypertension, Diabetes                 and Dyslipidemia.  Sonographer:    Terrilee Few RCS Referring Phys: ARRIEN, MAURICIO, DANIEL IMPRESSIONS  1. Left ventricular ejection fraction, by estimation, is 65 to 70%. The left ventricle has normal function. The left ventricle has no regional wall motion abnormalities. Left ventricular diastolic parameters were normal.  2. Right ventricular systolic function is normal. The right ventricular size is normal. Tricuspid regurgitation signal is inadequate for assessing PA pressure.  3. No evidence of mitral valve regurgitation. No evidence of mitral stenosis.  4. The aortic valve is tricuspid. There is mild calcification of the aortic valve. There is mild thickening of the aortic valve. Aortic valve regurgitation is moderate. Aortic valve sclerosis/calcification is present, without any evidence of aortic stenosis.  5. Aortic dilatation noted. There is mild dilatation of the aortic root, measuring 41 mm. There is mild dilatation of the ascending aorta, measuring 42 mm. FINDINGS  Left Ventricle: Left ventricular ejection fraction, by estimation, is 65 to  70%. The left ventricle has normal function. The left ventricle has no regional wall motion abnormalities. The left ventricular internal cavity size was normal in size. There is  no left ventricular hypertrophy. Left ventricular diastolic parameters were normal. Normal left ventricular filling pressure. Right Ventricle: The right ventricular size is normal. Right ventricular systolic function is normal. Tricuspid regurgitation signal is inadequate for assessing PA pressure. Pericardium: There is no evidence of pericardial effusion. Mitral Valve: There is mild thickening of the anterior mitral valve leaflet(s). There is mild calcification of the anterior mitral valve leaflet(s). No evidence of mitral valve stenosis. Aortic Valve: The aortic valve is tricuspid. There is mild  calcification of the aortic valve. There is mild thickening of the aortic valve. Aortic valve regurgitation is moderate. Aortic valve sclerosis/calcification is present, without any evidence of aortic stenosis. Aortic valve peak gradient measures 7.5 mmHg. Pulmonic Valve: The pulmonic valve was grossly normal. Pulmonic valve regurgitation is not visualized. No evidence of pulmonic stenosis. Aorta: Aortic dilatation noted. There is mild dilatation of the aortic root, measuring 41 mm. There is mild dilatation of the ascending aorta, measuring 42 mm. Venous: The inferior vena cava was not well visualized. LEFT VENTRICLE PLAX 2D LVIDd:         4.80 cm   Diastology LVIDs:         2.90 cm   LV e' medial:    7.72 cm/s LV PW:         1.10 cm   LV E/e' medial:  9.5 LV IVS:        0.80 cm   LV e' lateral:   9.57 cm/s LVOT diam:     2.40 cm   LV E/e' lateral: 7.7 LV SV:         96 LV SV Index:   46 LVOT Area:     4.52 cm  RIGHT VENTRICLE RV S prime:     15.00 cm/s TAPSE (M-mode): 1.6 cm LEFT ATRIUM         Index LA diam:    3.60 cm 1.73 cm/m  AORTIC VALVE AV Area (Vmax): 3.40 cm AV Vmax:        137.00 cm/s AV Peak Grad:   7.5 mmHg LVOT Vmax:      103.00 cm/s LVOT Vmean:     64.500 cm/s LVOT VTI:       0.212 m  AORTA Ao Root diam: 4.10 cm Ao Asc diam:  4.20 cm MITRAL VALVE MV Area (PHT): 4.60 cm    SHUNTS MV Decel Time: 165 msec    Systemic VTI:  0.21 m MV E velocity: 73.40 cm/s  Systemic Diam: 2.40 cm MV A velocity: 68.10 cm/s MV E/A ratio:  1.08 Mihai Croitoru MD Electronically signed by Luana Rumple MD Signature Date/Time: 02/10/2024/5:14:47 PM    Final    DG Swallowing Func-Speech Pathology Result Date: 02/09/2024 Table formatting from the original result was not included. Images from the original result were not included. Modified Barium Swallow Study Patient Details Name: Kyle Montoya MRN: [id number] Date of Birth: Dec 31, 1950 Today's Date: 02/09/2024 HPI/PMH: HPI: Patient is a 73 y.o. male with PMH: ETOH abuse,  COPD, HTN, heart failure, ascending aortic aneurysm, h/o lung cancer s/p resection and cholangiocarcinoma. 2022 EGD reported that the distal esophagus was moderately tortuous. He presented to the hospital on 02/05/2024 with generalized weakness and lower extremity edema. On 5/16 he started withdrawing form alcohol and started on phenobarb and required vasopressor support. He was found to have  a RLL PNA. SLP swallow evaluation ordered on 02/08/24 due to RN noting patient to exhibit coughing when drinking liquids. Review of records shows Dec 2009 DG cervical spine:  Severe degenerative disc disease changes.  Fusion from C2-C5  anteriorly due to flowing osteophytes. Clinical Impression: Pt presents with flowing anterior osteophytes from C2-C5 (documentated in Dec 2009 - no further imaging of cervical vertebrae could be found subsequently).  These create a structural impediment to swallowing - obstructing epiglottic closure over the larynx, creating narrow pyriform sinuses (smaller "holding bay" for material), allowing residue to accumulate superior to epiglottis and within hypopharynx.  Materials spill into larynx/airway before, during, and after the swallow - thin liquids most likely to be aspirated. There is a reliable cough in response to aspiration, but it is weak and not sufficient to eject aspirate/penetrant from trachea and larynx.  Attempted to recline pt to redirect flow of barium into the esophagus and away from trachea, but this was not effective.  Kyle Montoya has lived with these large osteophytes for many years, but has decompensated and is unable to protect the airway and/or has more of the comorbidities that make him susceptible to aspiration pna. It is certainly possible that with improved mental status/awareness his function will improve.  D/W Dr. Arrien - for now, continue NPO except meds crushed with purees and ice chips after oral care. SLP will follow.  DIGEST Swallow Severity Rating*              Safety:            2             Efficiency: 2             Overall Pharyngeal Swallow Severity: 2 - moderate  1: mild; 2: moderate; 3: severe; 4: profound *The Dynamic Imaging Grade of Swallowing Toxicity is standardized for the head and neck cancer population, however, demonstrates promising clinical applications across populations to standardize the clinical rating of pharyngeal swallow safety and severity.  Factors that may increase risk of adverse event in presence of aspiration Roderick Civatte & Jessy Morocco 2021): Factors that may increase risk of adverse event in presence of aspiration Roderick Civatte & Jessy Morocco 2021): Respiratory or GI disease; Weak cough; Aspiration of thick, dense, and/or acidic materials Recommendations/Plan: Swallowing Evaluation Recommendations Swallowing Evaluation Recommendations Recommendations: NPO; NPO except meds Medication Administration: Crushed with puree Oral care recommendations: Oral care QID (4x/day); Oral care before ice chips/water Treatment Plan Treatment Plan Treatment recommendations: Therapy as outlined in treatment plan below Follow-up recommendations: Other (comment) (tba) Functional status assessment: Patient has had a recent decline in their functional status and demonstrates the ability to make significant improvements in function in a reasonable and predictable amount of time. Treatment frequency: Min 2x/week Treatment duration: 2 weeks Interventions: Patient/family education; Trials of upgraded texture/liquids; Diet toleration management by SLP Recommendations Recommendations for follow up therapy are one component of a multi-disciplinary discharge planning process, led by the attending physician.  Recommendations may be updated based on patient status, additional functional criteria and insurance authorization. Assessment: Orofacial Exam: Orofacial Exam Oral Cavity: Oral Hygiene: WFL Oral Cavity - Dentition: Edentulous Orofacial Anatomy: WFL Anatomy: Anatomy: Suspected cervical  osteophytes (Fusion from C2-C5 anteriorly due to flowing osteophytes (per DG cervical spine 2009)) Boluses Administered: Boluses Administered Boluses Administered: Thin liquids (Level 0); Mildly thick liquids (Level 2, nectar thick); Puree (dys 2)  Oral Impairment Domain: Oral Impairment Domain Lip Closure: No labial escape Tongue control during bolus hold: Not  tested Bolus preparation/mastication: Slow prolonged chewing/mashing with complete recollection Bolus transport/lingual motion: Slow tongue motion Oral residue: Trace residue lining oral structures Initiation of pharyngeal swallow : Pyriform sinuses  Pharyngeal Impairment Domain: Pharyngeal Impairment Domain Soft palate elevation: No bolus between soft palate (SP)/pharyngeal wall (PW) Laryngeal elevation: Complete superior movement of thyroid cartilage with complete approximation of arytenoids to epiglottic petiole Anterior hyoid excursion: Complete anterior movement Epiglottic movement: Partial inversion Laryngeal vestibule closure: Incomplete, narrow column air/contrast in laryngeal vestibule Pharyngeal stripping wave : Present - complete Pharyngeal contraction (A/P view only): N/A Pharyngoesophageal segment opening: Partial distention/partial duration, partial obstruction of flow Tongue base retraction: Narrow column of contrast or air between tongue base and PPW Pharyngeal residue: Collection of residue within or on pharyngeal structures Location of pharyngeal residue: Valleculae; Pharyngeal wall; Pyriform sinuses  Esophageal Impairment Domain: Esophageal Impairment Domain Esophageal clearance upright position: Esophageal retention Pill: Pill Consistency administered: -- (NT) Penetration/Aspiration Scale Score: Penetration/Aspiration Scale Score 3.  Material enters airway, remains ABOVE vocal cords and not ejected out: Puree 5.  Material enters airway, CONTACTS cords and not ejected out: Mildly thick liquids (Level 2, nectar thick) 7.  Material enters  airway, passes BELOW cords and not ejected out despite cough attempt by patient: Thin liquids (Level 0) Compensatory Strategies: Compensatory Strategies Compensatory strategies: Yes Reclining posture: Ineffective   General Information: Caregiver present: No  Diet Prior to this Study: NPO   Temperature : Normal   No data recorded  No data recorded  History of Recent Intubation: No  Behavior/Cognition: Alert; Requires cueing; Lethargic/Drowsy; Confused Self-Feeding Abilities: Other (Comment) (mitts on during MBS -fed by examiner) Baseline vocal quality/speech: Dysphonic Volitional Cough: Able to elicit Volitional Swallow: Able to elicit No data recorded Goal Planning: Prognosis for improved oropharyngeal function: Good No data recorded No data recorded Patient/Family Stated Goal: nothing stated and no family present No data recorded Pain: Pain Assessment Pain Assessment: No/denies pain Faces Pain Scale: 0 End of Session: Start Time:SLP Start Time (ACUTE ONLY): 1418 Stop Time: SLP Stop Time (ACUTE ONLY): 1444 Time Calculation:SLP Time Calculation (min) (ACUTE ONLY): 26 min Charges: SLP Evaluations $ SLP Speech Visit: 1 Visit SLP Evaluations $BSS Swallow: 1 Procedure $MBS Swallow: 1 Procedure $Swallowing Treatment: 1 Procedure SLP visit diagnosis: SLP Visit Diagnosis: Dysphagia, pharyngeal phase (R13.13) Past Medical History: Past Medical History: Diagnosis Date  Abnormal colonoscopy 02/23/2021  Abnormal transaminases 02/27/2022  Acute diastolic (congestive) heart failure (HCC) 10/08/2023  Adenomatous polyp of descending colon 02/23/2021  Adverse drug reaction, initial encounter 07/04/2016  07/02/2016: Prevnar 23 vaccine given in right arm  07/02/2016: localized swelling  10/11/20167: Increase in swelling from shoulder to inner aspect of elbow.Red, warm and firm to touch.  No lip or airway swelling or stridor.  2+ brachial and radial pulses right arm.  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 Kirk Peper Oncologist, has f/u every 6-12 months.  Cataract   bilateral sx  Cervical osteoarthritis 10/18/2014  Cholangiocarcinoma of biliary tract (HCC)   Chronic cough 01/31/2015  Chronic myelogenous leukemia (HCC)   Chronic pain 01/31/2015  CML (chronic myelocytic leukemia) (HCC) 10/18/2014  Overview:   Follows with [facility], Dr. Almer Jacobson. On dasatinib , last WBC 9.6.    Compulsive tobacco user syndrome 01/31/2015  Congestive heart failure (CHF) (HCC) 09/26/2023  COPD (chronic obstructive pulmonary disease) (HCC)   uses inhaler  COPD with asthma (HCC) 01/22/2013  Severe obstruction on spirometry FeV1 54% FeV1/FVC 51% 01/31/2015  CXR NAD 01/25/15    Diuretic-induced hypokalemia 05/23/2023  DJD (degenerative joint disease)   Dyslipidemia   Family history of colonic polyps   Family history of prostate cancer   Full thickness rotator cuff tear 08/08/2020  Gout   High coronary artery calcium score 05/30/2023  History of colon polyps 02/23/2021  History of colonic polyps 10/01/2021  IMO SNOMED Dx Update Oct 2024    HLD (hyperlipidemia) 01/31/2015  HTN (hypertension)   on meds  Hypercalcemia 08/10/2020  Hypercholesterolemia   Hypocalcemia 08/27/2021  Hyponatremia 09/19/2023  Impingement syndrome of both shoulders 03/31/2020  Lung cancer (HCC)   Myelogenous leukemia (HCC)   Non-small cell carcinoma of lung (HCC) 01/31/2015  02/13/2015 CT Chest no evidence of recurrence in lung cancer  Stage I adenocarcinoma diagnosed with left upper lobectomy in February 2016    OA (osteoarthritis)   Orthostatic hypotension 10/10/2023  PAD (peripheral artery disease)  (HCC)   Personal history of gastric ulcers 02/23/2021  Renal insufficiency   Shoulder pain, left 03/29/2020  Shoulder pain, right 03/29/2020  Tendonitis   patellar  Thoracic aortic aneurysm (HCC) 06/27/2022  Wheezing 01/31/2015 Past Surgical History: Past Surgical History: Procedure Laterality Date  BIOPSY  04/30/2021  Procedure: BIOPSY;  Surgeon: Normie Becton., MD;  Location: [facility];  Service: Gastroenterology;;  Bowel duct surg  2014  Bowel cancer  CARDIAC CATHETERIZATION  2000  CHOLECYSTECTOMY    COLONOSCOPY  06/23/2015  mild sigmoid diverticulosis. small external and internal hemorrhoids. otherwise normal colonoscopy to terminal ileum  COLONOSCOPY WITH PROPOFOL  N/A 04/30/2021  Procedure: COLONOSCOPY WITH PROPOFOL ;  Surgeon: Brice Campi Albino Alu., MD;  Location: [facility];  Service: Gastroenterology;  Laterality: N/A;  ENDOSCOPIC MUCOSAL RESECTION N/A 04/30/2021  Procedure: ENDOSCOPIC MUCOSAL RESECTION;  Surgeon: Brice Campi Albino Alu., MD;  Location: [facility];  Service: Gastroenterology;  Laterality: N/A;  ESOPHAGOGASTRODUODENOSCOPY (EGD) WITH PROPOFOL  N/A 04/30/2021  Procedure: ESOPHAGOGASTRODUODENOSCOPY (EGD) WITH PROPOFOL ;  Surgeon: Brice Campi Albino Alu., MD;  Location: [facility];  Service: Gastroenterology;  Laterality: N/A;  HEMOSTASIS CLIP PLACEMENT  04/30/2021  Procedure: HEMOSTASIS CLIP PLACEMENT;  Surgeon: Normie Becton., MD;  Location: [facility];  Service: Gastroenterology;;  LUNG CANCER SURGERY  09/2014  partial lobectomy  POLYPECTOMY  04/30/2021  Procedure: POLYPECTOMY;  Surgeon: Mansouraty, Albino Alu., MD;  Location: [facility];  Service: Gastroenterology;;  SHOULDER OPEN ROTATOR CUFF REPAIR Right 09/27/2020  Procedure: Right shoulder mini open rotator cuff repair, distal clavicle resection;  Surgeon: Orvan Blanch, MD;  Location: [facility];  Service: Orthopedics;  Laterality: Right;  90 mins Choice with Block anesthesia  SUBMUCOSAL LIFTING INJECTION   04/30/2021  Procedure: SUBMUCOSAL LIFTING INJECTION;  Surgeon: Normie Becton., MD;  Location: [facility];  Service: Gastroenterology;;  UPPER GASTROINTESTINAL ENDOSCOPY    WHIPPLE PROCEDURE  02/25/2013  Procedure: WHIPPLE PROCEDURE; Surgeon: Kirk Peper, MD; Location: [facility] MAIN OR; Service: General; Laterality: N/A; Amanda L. Beatris Lincoln, MA CCC/SLP Clinical Specialist - Acute Care SLP Acute Rehabilitation Services Office number [phone] Rheba Cedar 02/09/2024, 5:51 PM  DG Chest Port 1 View Result Date: 02/05/2024 CLINICAL DATA:  Shortness of breath. EXAM: PORTABLE CHEST 1 VIEW COMPARISON:  Chest [place]  dated 12/25/2023. FINDINGS: Small right  pleural effusion and right lung base atelectasis or infiltrate. The left lung is clear. No pneumothorax. Stable cardiac silhouette. No acute osseous pathology. IMPRESSION: Small right pleural effusion and right lung base atelectasis or infiltrate. Electronically Signed   By: Angus Bark M.D.   On: 02/05/2024 17:16    Labs:  CBC: Recent Labs    02/13/24 0356 02/14/24 0532 02/16/24 0219 02/17/24 0228  WBC 3.9* 6.3 5.1 3.9*  HGB 7.3* 7.4* 7.1* 7.0*  HCT 21.8* 22.1* 21.7* 21.5*  PLT 247 263 244 242    COAGS: Recent Labs    02/06/24 0344 02/17/24 0228  INR 1.1 1.1  APTT 125*  --     BMP: Recent Labs    02/13/24 0356 02/14/24 0245 02/16/24 0219 02/17/24 0228  NA 138 138 135 135  K 3.6 4.3 4.0 3.8  CL 109 108 106 105  CO2 24 22 26 23   GLUCOSE 110* 92 117* 90  BUN 10 12 13 11   CALCIUM 8.0* 8.1* 7.8* 7.8*  CREATININE 0.66 0.66 0.57* 0.52*  GFRNONAA >60 >60 >60 >60    LIVER FUNCTION TESTS: Recent Labs    12/02/23 1159 12/05/23 0512 12/08/23 0419 01/20/24 0934 02/05/24 1524 02/06/24 0344  BILITOT 1.0  --   --  0.5 0.7 0.7  AST 70*  --   --  33 61* 54*  ALT 36  --   --  11 26 23   ALKPHOS 71  --   --  85 69 61  PROT 6.9  --   --  6.5 5.7* 5.2*  ALBUMIN  3.3*   < > 3.7 3.4* 2.4* 2.4*   < > = values in  this interval not displayed.    TUMOR MARKERS: Recent Labs    10/21/23 0839 01/20/24 0934  CEA 3.33 1.11    Assessment and Plan: 73 y.o. male with severe dysphagia who presents for G tube placement.   Care order placed for the TF to be held at 2000 hrs on 5/26, PROSource TF20 given at 0847 hrs  VSS CBC stable WBC 3.9, hbg 7.0, plt 242 INR 1.1 today  Allergies reviewed  2 g ancef  during the procedure,signed and held  Risks and benefits image guided gastrostomy tube placement was discussed with the patient's daughter including, but not limited to the need for a barium enema during the procedure, bleeding, infection, peritonitis and/or damage to adjacent structures.  All of the daughter's questions were answered, she is agreeable to proceed.  Consent signed and in chart.  Patient currently has DNR order in place. Discussion with the patient and family regarding wishes.  The original DNR order is maintained and prior treatment limitations are upheld during the procedure.   Thank you for this interesting consult.  I greatly enjoyed meeting Kyle Montoya and look forward to participating in their care.  A copy of this report was sent to the requesting provider on this date.  Electronically Signed: Pasty Bongo, PA-C 02/17/2024, 4:12 PM   I spent a total of 40 Minutes    in face to face in clinical consultation, greater than 50% of which was counseling/coordinating care for poor oral intake, G tube placement request.   This chart was dictated using voice recognition software.  Despite best efforts to proofread,  errors can occur which can change the documentation meaning.

## 2024-02-17 NOTE — Procedures (Signed)
 Interventional Radiology Procedure Note  Procedure: Percutaneous G tube placement  Complications: None  Estimated Blood Loss: < 10 mL  Findings: 18Fr G tube placement with fluor.  OK to use G tube 1 hour post placement.  Rosetta Cons, MD

## 2024-02-17 NOTE — Progress Notes (Addendum)
 Patient has a G-tube at 5 PM 5/27.  Complaining about abdominal pain postprocedure.  For pain control starting morphine 1 mg every 4 hour as needed for moderate to severe pain control.

## 2024-02-17 NOTE — Progress Notes (Signed)
 SLP Cancellation Note  Patient Details Name: Kyle Montoya MRN: [id number] DOB: Oct 10, 1950   Cancelled treatment:       Reason Eval/Treat Not Completed: Other (comment) (NPO for potential PEG in IR today. SLP will follow for readiness)   Jacqualine Mater, MA, CCC-SLP Speech Therapy

## 2024-02-17 NOTE — Progress Notes (Signed)
 Occupational Therapy Treatment Patient Details Name: Kyle Montoya MRN: [id number] DOB: [birth year] Today's Date: 02/17/2024   History of present illness 73 y.o. male presents to [facility] 02/05/24 with generalized weakness and LE edema. Admitted to ICU (5/15-5/18) with septic shock due to RLL PNA 2/2 aspiration and hypotension. 5/16 withdraw from alcohol, CIWA protocol. Complicated with heart failure. PMHx:HFpEF, alcohol abuse, cervical DDD,Gout, Asthma, COPD, Hyponatremia, Pneumonia, HTN, Anemia, Ascending Aortic Aneurysm, Gastric Ulcer disease with H. Pylori, paroxysmal Atrial Fibrillation, Lung Cancer stage IA 2016 s/p resection, CML diagnosed 2015, Cholangiocarcinoma, B12 defeciency, Alcoholic Hepatitis   OT comments  Pt progressing toward goals this session, able to perform bed mobility with CGA, CGA for transfers with rollator. Pt able to stand at sink x20 min for standing grooming tasks. Pt presenting with impairments listed below, will follow acutely. Pt will continue to benefit from postacute rehab <3 hours/day at d/c due to decr support at home.      If plan is discharge home, recommend the following:  A little help with walking and/or transfers;A lot of help with bathing/dressing/bathroom;Direct supervision/assist for medications management;Direct supervision/assist for financial management;Assist for transportation;Help with stairs or ramp for entrance   Equipment Recommendations  Tub/shower seat    Recommendations for Other Services PT consult    Precautions / Restrictions Precautions Precautions: Fall Recall of Precautions/Restrictions: Impaired Precaution/Restrictions Comments: cortrak Restrictions Weight Bearing Restrictions Per Provider Order: No       Mobility Bed Mobility Overal bed mobility: Needs Assistance Bed Mobility: Supine to Sit, Sit to Supine     Supine to sit: Contact guard Sit to supine: Contact guard assist        Transfers Overall transfer level:  Needs assistance Equipment used: Rollator (4 wheels) Transfers: Sit to/from Stand Sit to Stand: Contact guard assist, From elevated surface                 Balance Overall balance assessment: Needs assistance, Mild deficits observed, not formally tested Sitting-balance support: No upper extremity supported, Feet supported Sitting balance-Leahy Scale: Good     Standing balance support: Bilateral upper extremity supported, During functional activity, Reliant on assistive device for balance Standing balance-Leahy Scale: Poor Standing balance comment: reliant on RW                           ADL either performed or assessed with clinical judgement   ADL Overall ADL's : Needs assistance/impaired Eating/Feeding: NPO   Grooming: Set up;Standing;Wash/dry face;Brushing hair           Upper Body Dressing : Minimal assistance;Sitting Upper Body Dressing Details (indicate cue type and reason): donning robe     Toilet Transfer: Supervision/safety;Ambulation;Rollator (4 wheels) Toilet Transfer Details (indicate cue type and reason): simulated via functional mobility         Functional mobility during ADLs: Supervision/safety;Rollator (4 wheels)      Extremity/Trunk Assessment Upper Extremity Assessment Upper Extremity Assessment: Overall WFL for tasks assessed   Lower Extremity Assessment Lower Extremity Assessment: Defer to PT evaluation        Vision   Vision Assessment?: No apparent visual deficits   Perception Perception Perception: Not tested   Praxis Praxis Praxis: Not tested   Communication Communication Communication: Impaired Factors Affecting Communication: Reduced clarity of speech   Cognition Arousal: Alert Behavior During Therapy: WFL for tasks assessed/performed, Restless Cognition: Cognition impaired   Orientation impairments: Situation, Time Awareness: Online awareness impaired, Intellectual awareness impaired Memory impairment  (select  all impairments): Short-term memory, Non-declarative long-term memory Attention impairment (select first level of impairment): Sustained attention, Selective attention Executive functioning impairment (select all impairments): Organization, Sequencing, Reasoning, Problem solving OT - Cognition Comments: follows commands, decr insight to deficits, states he "wants tube out of his nose"                 Following commands: Impaired Following commands impaired: Follows multi-step commands inconsistently      Cueing   Cueing Techniques: Verbal cues, Tactile cues  Exercises      Shoulder Instructions       General Comments VSS    Pertinent Vitals/ Pain       Pain Assessment Pain Assessment: No/denies pain  Home Living                                          Prior Functioning/Environment              Frequency  Min 2X/week        Progress Toward Goals  OT Goals(current goals can now be found in the care plan section)  Progress towards OT goals: Progressing toward goals  Acute Rehab OT Goals Patient Stated Goal: none stated OT Goal Formulation: With patient Time For Goal Achievement: 02/26/24 Potential to Achieve Goals: Good ADL Goals Pt Will Perform Grooming: with set-up;standing Pt Will Perform Lower Body Bathing: with supervision;sit to/from stand;sitting/lateral leans Pt Will Transfer to Toilet: with set-up;ambulating Pt Will Perform Toileting - Clothing Manipulation and hygiene: with supervision;sit to/from stand;sitting/lateral leans  Plan      Co-evaluation                 AM-PAC OT "6 Clicks" Daily Activity     Outcome Measure   Help from another person eating meals?:  (has mobility but is NPO) Help from another person taking care of personal grooming?: A Little Help from another person toileting, which includes using toliet, bedpan, or urinal?: A Little Help from another person bathing (including washing,  rinsing, drying)?: A Lot Help from another person to put on and taking off regular upper body clothing?: A Little Help from another person to put on and taking off regular lower body clothing?: A Lot 6 Click Score: 13    End of Session Equipment Utilized During Treatment: Rollator (4 wheels)  OT Visit Diagnosis: Other abnormalities of gait and mobility (R26.89);Muscle weakness (generalized) (M62.81);Other symptoms and signs involving cognitive function   Activity Tolerance Patient tolerated treatment well   Patient Left in bed;with call bell/phone within reach;with bed alarm set   Nurse Communication Mobility status        Time: 1432-1500 OT Time Calculation (min): 28 min  Charges: OT General Charges $OT Visit: 1 Visit OT Treatments $Self Care/Home Management : 23-37 mins  Jordanny Waddington K, OTD, OTR/L SecureChat Preferred Acute Rehab [phone]   Kyle Montoya 02/17/2024, 4:05 PM

## 2024-02-17 NOTE — Progress Notes (Signed)
 PROGRESS NOTE    Kyle Montoya  RUE:[id number] DOB: 08/25/1951 DOA: 02/05/2024 PCP: Teofilo Fellers, NP  73/M w diastolic CHF, COPD, EtOH abuse, history of multiple cancers namely lung cancer sp resection and cholangiocarcinoma presented to the ED with generalized weakness and lower extremity edema. In the ER he was hypotensive with lower extremity edema, labs noted hyponatremia, creatinine 1.5, mildly elevated LFTs, BNP 149, troponin 56, lactic acid 1.9, chest x-ray with pulmonary vascular congestion, pleural effusion -Fluid resuscitated, also treated with antibiotics and midodrine   - 5/16 developed EtOH withdrawal, started on phenobarbital , continued vasopressor support  - 5/17 weaned off vasopressors  -5/18 transfer to [facility].  -5/19 clinically improving, delirium, required mittens -5/20 noted to have dysphagia and not able to have enteral po nutrition. Ordered cortrak - 5/21: Cortrak placed, tube feeds started, delirioum - 5/22: Palliative care consulted, goals of care discussions ongoing -5/23: More delirium -5/24: Mental status improving - 5/25, repeat family meeting, decision made to proceed with PEG   Subjective: - IR consulting for PEG tube placement  Assessment and Plan:  Septic shock, resolved Right lower lobe/aspiration pneumonia -Completed antibiotic course -Dysphagia, see below  Severe dysphagia - On swallowing evaluation he was noted to have large flowing anterior osteophytes C2-C5 producing obstruction to his swallowing.  - Despite being awake, continued to do poorly with swallowing assessment, discussed with daughter, cortrak placed 5/21, now on tube feeds, requested palliative care evaluation on account of ongoing issues with delirium, numerous comorbidities etc. and facilitate further discussion regarding PEG tube - Appreciate palliative assistance, goals of care discussions ongoing, after much deliberation, back-and-forth discussions patient and family confirmed  decision 5/25 to proceed with PEG tube - IR consulted  Acute on chronic diastolic CHF (congestive heart failure) (HCC) Echocardiogram with preserved LV systolic function EF 65 to 70%, RV systolic function preserved, no significant valvular disease.  - Cut down midodrine  - Restart diuretics, continue IV Lasix  1-2 more days  Iron  deficiency anemia - Given IV iron  - Is not in clinical condition that is appropriate for further evaluation at this time, recommend GI follow-up - Hemoglobin is down to 7.1, suspect hemodilution contributing as well, monitor with diuresis, may need transfusion if it drops further  Alcohol withdrawal (HCC) - Resolved, now off phenobarbital  Continue thiamine  and multivitamins   Delirium, agitation - Intermittent delirium, overall improving now, continue Qpm Seroquel  - Haldol  as needed  HTN (hypertension) Continue midodrine  for blood pressure control.    AKI (acute kidney injury) (HCC) Hyponatremia. hypokalemia - Resolved  CAD (coronary artery disease) No chest pain, no acute coronary syndrome   COPD with asthma (HCC) Stable Continue with bronchodilator therapy   Type 2 diabetes mellitus with hyperlipidemia (HCC) Stable, hold off on insulin   Ascending aortic aneurysm (HCC) Follow up as outpatient   Carcinoma of biliary tract (HCC) Follow up as outpatient.     DVT prophylaxis: SCDs Code Status: DNR Family Communication: No family at bedside, updated daughter few days ago Disposition Plan: SNF after PEG tube  Consultants:    Procedures:   Antimicrobials:    Objective: Vitals:   02/17/24 0006 02/17/24 0259 02/17/24 0439 02/17/24 0806  BP: (!) 102/58 128/80  100/81  Pulse: 75 80    Resp: 20 20    Temp:  97.6 F (36.4 C)  97.8 F (36.6 C)  TempSrc:  Oral  Oral  SpO2: 94% 94%    Weight:   86.2 kg   Height:        Intake/Output  Summary (Last 24 hours) at 02/17/2024 1112 Last data filed at 02/17/2024 4098 Gross per 24 hour   Intake 130 ml  Output 1075 ml  Net -945 ml   Filed Weights   02/14/24 0309 02/16/24 0422 02/17/24 0439  Weight: 84.6 kg 88.6 kg 86.2 kg    Examination:  General exam: Chronically ill elderly male lying in bed, AO x 2, mild cognitive deficits HEENT: _ JVD,  cortrak noted VS: S1-S2, regular rhythm Lungs: Decreased breath sounds at the bases Abdomen: Soft, nontender, bowel sounds present EXTR: No edema  psych: Flat affect  Data Reviewed:   CBC: Recent Labs  Lab 02/12/24 0235 02/13/24 0356 02/14/24 0532 02/16/24 0219 02/17/24 0228  WBC 3.8* 3.9* 6.3 5.1 3.9*  HGB 7.4* 7.3* 7.4* 7.1* 7.0*  HCT 21.5* 21.8* 22.1* 21.7* 21.5*  MCV 105.4* 107.4* 108.9* 108.5* 107.5*  PLT 239 247 263 244 242   Basic Metabolic Panel: Recent Labs  Lab 02/12/24 0235 02/13/24 0356 02/14/24 0245 02/16/24 0219 02/17/24 0228  NA 138 138 138 135 135  K 3.9 3.6 4.3 4.0 3.8  CL 108 109 108 106 105  CO2 22 24 22 26 23   GLUCOSE 119* 110* 92 117* 90  BUN 11 10 12 13 11   CREATININE 0.84 0.66 0.66 0.57* 0.52*  CALCIUM 8.3* 8.0* 8.1* 7.8* 7.8*  MG 1.8 1.9 2.0  --   --   PHOS 2.8 2.1* 2.3*  --   --    GFR: Estimated Creatinine Clearance: 88.9 mL/min (A) (by C-G formula based on SCr of 0.52 mg/dL (L)). Liver Function Tests: No results for input(s): "AST", "ALT", "ALKPHOS", "BILITOT", "PROT", "ALBUMIN " in the last 168 hours.  No results for input(s): "LIPASE", "AMYLASE" in the last 168 hours. Recent Labs  Lab 02/11/24 0830  AMMONIA 19   Coagulation Profile: Recent Labs  Lab 02/17/24 0228  INR 1.1    Cardiac Enzymes: No results for input(s): "CKTOTAL", "CKMB", "CKMBINDEX", "TROPONINI" in the last 168 hours. BNP (last 3 results) No results for input(s): "PROBNP" in the last 8760 hours. HbA1C: No results for input(s): "HGBA1C" in the last 72 hours. CBG: Recent Labs  Lab 02/16/24 2023 02/17/24 0003 02/17/24 0307 02/17/24 0519 02/17/24 0808  GLUCAP 104* 95 75 91 84   Lipid  Profile: No results for input(s): "CHOL", "HDL", "LDLCALC", "TRIG", "CHOLHDL", "LDLDIRECT" in the last 72 hours. Thyroid Function Tests: No results for input(s): "TSH", "T4TOTAL", "FREET4", "T3FREE", "THYROIDAB" in the last 72 hours. Anemia Panel: No results for input(s): "VITAMINB12", "FOLATE", "FERRITIN", "TIBC", "IRON", "RETICCTPCT" in the last 72 hours.  Urine analysis:    Component Value Date/Time   COLORURINE YELLOW 02/05/2024 1826   APPEARANCEUR CLEAR 02/05/2024 1826   LABSPEC 1.005 02/05/2024 1826   PHURINE 6.0 02/05/2024 1826   GLUCOSEU NEGATIVE 02/05/2024 1826   HGBUR NEGATIVE 02/05/2024 1826   BILIRUBINUR NEGATIVE 02/05/2024 1826   KETONESUR NEGATIVE 02/05/2024 1826   PROTEINUR NEGATIVE 02/05/2024 1826   NITRITE NEGATIVE 02/05/2024 1826   LEUKOCYTESUR NEGATIVE 02/05/2024 1826   Sepsis Labs: @LABRCNTIP (procalcitonin:4,lacticidven:4)  ) No results found for this or any previous visit (from the past 240 hours).    Radiology Studies: DG Abd Portable 1V Result Date: 02/17/2024 CLINICAL DATA:  Poor appetite. EXAM: PORTABLE ABDOMEN - 1 VIEW COMPARISON:  Radiograph 02/11/2024 FINDINGS: Feeding tube extends into the mid stomach. Interval clearance of barium from the colon. No dilated loops of large or small bowel identified. IMPRESSION: 1. Feeding tube in stomach. 2. Nonobstructive bowel gas pattern.  Electronically Signed   By: Deboraha Fallow M.D.   On: 02/17/2024 10:10   CT ABDOMEN WO CONTRAST Result Date: 02/16/2024 CLINICAL DATA:  Dysphagia EXAM: CT ABDOMEN WITHOUT CONTRAST TECHNIQUE: Multidetector CT imaging of the abdomen was performed following the standard protocol without IV contrast. RADIATION DOSE REDUCTION: This exam was performed according to the departmental dose-optimization program which includes automated exposure control, adjustment of the mA and/or kV according to patient size and/or use of iterative reconstruction technique. COMPARISON:  None Available.  FINDINGS: Lower chest: Large bilateral pleural effusions. Volume loss at the bases above the effusions. Small pericardial effusion. Hepatobiliary: No focal liver abnormality is seen. Status post cholecystectomy. No biliary dilatation. Pancreas: Unremarkable. No pancreatic ductal dilatation or surrounding inflammatory changes. Spleen: Normal in size without focal abnormality. Adrenals/Urinary Tract: Adrenal glands are unremarkable. Kidneys are normal, without renal calculi, focal lesion, or hydronephrosis. Stomach/Bowel: Feeding tube tip is in the stomach. Vascular/Lymphatic: Aortic atherosclerosis. No enlarged abdominal lymph nodes. Other: No abdominal wall hernia or abnormality. Musculoskeletal: No acute or significant osseous findings. IMPRESSION: 1. Large bilateral pleural effusions. 2. Small pericardial effusion. 3. No acute abdominal pathology identified. 4. Aortic atherosclerosis (ICD10-I70.0). Electronically Signed   By: Sydell Eva M.D.   On: 02/16/2024 08:17      Scheduled Meds:  Chlorhexidine  Gluconate Cloth  6 each Topical Daily   famotidine  40 mg Per Tube BID   feeding supplement (PROSource TF20)  60 mL Per Tube Daily   folic acid   1 mg Per Tube Daily   free water  200 mL Per Tube Q8H   furosemide   40 mg Intravenous BID   midodrine   5 mg Per Tube BID WC   multivitamin with minerals  1 tablet Per Tube Daily   QUEtiapine  25 mg Oral QHS   tamsulosin   0.8 mg Oral Daily   thiamine   100 mg Per Tube Q24H   Continuous Infusions:  feeding supplement (OSMOLITE 1.5 CAL) Stopped (02/16/24 2128)     LOS: 12 days    Time spent:    Deforest Fast, MD [facility]   02/17/2024, 11:12 AM

## 2024-02-17 NOTE — TOC Progression Note (Addendum)
 Transition of Care [facility]) - Progression Note    Patient Details  Name: Kyle Montoya MRN: [id number] Date of Birth: 1951-06-28  [facility] CM/SW Contact  Juliane Och, LCSW Phone Number: 02/17/2024, 10:27 AM  Clinical Narrative:     10:27 AM Per St[facility] Edelman SNF admissions, SNF insurance authorization request was submitted today and is currently pending.  4:24 PM [facility] admissions informed CSW that they have rescinded bed offer due to patient's PEG needs. CSW attempted to relay information and provide additional SNF bed offers to patient's daughter, [name], but there was no response and a voicemail was left. Patient was in IR when CSW attempted to provide patient additional SNF bed offers with Medicare ratings. CSW left SNF options on patient's bedside table for review.  4:35 PM CSW relayed information to patient's daughter, [name]. Maida Sciara was agreeable to CSW emailing patient's current SNF options with their quarterly Medicare ratings ([facility], [address], [address], [employer], [employer], [address], [place], [place], [employer] [facility]) to [email or web address]. CSW Geologist, engineering SNF options and Medicare ratings.  Expected Discharge Plan: Skilled Nursing Facility Barriers to Discharge: Insurance Authorization  Expected Discharge Plan and Services In-house Referral: Clinical Social Work   Post Acute Care Choice: Skilled Nursing Facility Living arrangements for the past 2 months: Single Family Home                                       Social Determinants of Health (SDOH) Interventions SDOH Screenings   Food Insecurity: No Food Insecurity (02/06/2024)  Housing: Low Risk  (02/06/2024)  Transportation Needs: No Transportation Needs (02/06/2024)  Utilities: Not At Risk (02/06/2024)  Social Connections: Moderately Isolated (02/06/2024)  Tobacco Use: Medium Risk (02/05/2024)    Readmission Risk Interventions     12/08/2023   10:22 AM 12/03/2023   10:44 AM 10/09/2023    3:26 PM  Readmission Risk Prevention Plan  Transportation Screening  Complete Complete  PCP or Specialist Appt within 5-7 Days   Complete  PCP or Specialist Appt within 3-5 Days Complete    Home Care Screening   Complete  Medication Review (RN CM)   Complete  HRI or Home Care Consult Complete Complete   Social Work Consult for Recovery Care Planning/Counseling  Complete   Palliative Care Screening Complete Not Applicable   Medication Review Oceanographer) Complete Complete

## 2024-02-17 NOTE — Progress Notes (Addendum)
 Mobility Specialist Progress Note;   02/17/24 0930  Mobility  Activity Ambulated with assistance in hallway;Transferred from bed to chair  Level of Assistance Contact guard assist, steadying assist  Assistive Device Four wheel walker  Distance Ambulated (ft) 200 ft  Activity Response Tolerated well  Mobility Referral Yes  Mobility visit 1 Mobility  Mobility Specialist Start Time (ACUTE ONLY) 0930  Mobility Specialist Stop Time (ACUTE ONLY) 0947  Mobility Specialist Time Calculation (min) (ACUTE ONLY) 17 min   Pt agreeable to mobility. Required light MinG assistance during ambulation for safety. Took 1x seated rest break d/t fatigue. VC required for rollator safety. VSS on RA. Pt requested to sit in chair at [facility]. Pt left with all needs met, call bell in reach.   Janit Meline Mobility Specialist Please contact via SecureChat or [employer] [phone]

## 2024-02-17 NOTE — Plan of Care (Signed)
  Problem: Coping: Goal: Ability to adjust to condition or change in health will improve Outcome: Progressing   Problem: Nutritional: Goal: Maintenance of adequate nutrition will improve Outcome: Progressing Goal: Progress toward achieving an optimal weight will improve Outcome: Progressing   Problem: Skin Integrity: Goal: Risk for impaired skin integrity will decrease Outcome: Progressing   Problem: Clinical Measurements: Goal: Will remain free from infection Outcome: Progressing Goal: Diagnostic test results will improve Outcome: Progressing   Problem: Nutrition: Goal: Adequate nutrition will be maintained Outcome: Progressing   Problem: Coping: Goal: Level of anxiety will decrease Outcome: Progressing   Problem: Elimination: Goal: Will not experience complications related to bowel motility Outcome: Progressing Goal: Will not experience complications related to urinary retention Outcome: Progressing

## 2024-02-18 DIAGNOSIS — E440 Moderate protein-calorie malnutrition: Secondary | ICD-10-CM

## 2024-02-18 DIAGNOSIS — J189 Pneumonia, unspecified organism: Secondary | ICD-10-CM | POA: Diagnosis not present

## 2024-02-18 LAB — BASIC METABOLIC PANEL WITH GFR
Anion gap: 4 — ABNORMAL LOW (ref 5–15)
BUN: 9 mg/dL (ref 8–23)
CO2: 23 mmol/L (ref 22–32)
Calcium: 7.7 mg/dL — ABNORMAL LOW (ref 8.9–10.3)
Chloride: 106 mmol/L (ref 98–111)
Creatinine, Ser: 0.6 mg/dL — ABNORMAL LOW (ref 0.61–1.24)
GFR, Estimated: >60 mL/min (ref >60)
Glucose, Bld: 116 mg/dL — ABNORMAL HIGH (ref 70–99)
Potassium: 4 mmol/L (ref 3.5–5.1)
Sodium: 133 mmol/L — ABNORMAL LOW (ref 135–145)

## 2024-02-18 LAB — CBC
HCT: 23.7 % — ABNORMAL LOW (ref 39.0–52.0)
Hemoglobin: 7.7 g/dL — ABNORMAL LOW (ref 13.0–17.0)
MCH: 35 pg — ABNORMAL HIGH (ref 26.0–34.0)
MCHC: 32.5 g/dL (ref 30.0–36.0)
MCV: 107.7 fL — ABNORMAL HIGH (ref 80.0–100.0)
Platelets: 290 10*3/uL (ref 150–400)
RBC: 2.2 MIL/uL — ABNORMAL LOW (ref 4.22–5.81)
RDW: 17.2 % — ABNORMAL HIGH (ref 11.5–15.5)
WBC: 4.2 10*3/uL (ref 4.0–10.5)
nRBC: 0 % (ref 0.0–0.2)

## 2024-02-18 LAB — GLUCOSE, CAPILLARY
Glucose-Capillary: 101 mg/dL — ABNORMAL HIGH (ref 70–99)
Glucose-Capillary: 103 mg/dL — ABNORMAL HIGH (ref 70–99)
Glucose-Capillary: 111 mg/dL — ABNORMAL HIGH (ref 70–99)
Glucose-Capillary: 116 mg/dL — ABNORMAL HIGH (ref 70–99)
Glucose-Capillary: 134 mg/dL — ABNORMAL HIGH (ref 70–99)
Glucose-Capillary: 90 mg/dL (ref 70–99)

## 2024-02-18 MED ORDER — ADULT MULTIVITAMIN W/MINERALS CH: 1.0000 | ORAL_TABLET | Freq: Every day | ORAL | Status: AC

## 2024-02-18 MED ORDER — MIDODRINE HCL 5 MG PO TABS: 10.0000 mg | ORAL_TABLET | Freq: Two times a day (BID) | ORAL | Status: AC

## 2024-02-18 MED ORDER — TAMSULOSIN HCL 0.4 MG PO CAPS: 0.8000 mg | ORAL_CAPSULE | Freq: Every day | ORAL | Status: DC

## 2024-02-18 MED ORDER — POLYETHYLENE GLYCOL 3350 17 G PO PACK: 17.0000 g | PACK | Freq: Every day | ORAL | Status: DC | PRN

## 2024-02-18 MED ORDER — TORSEMIDE 20 MG PO TABS
40.0000 mg | ORAL_TABLET | Freq: Every day | ORAL | Status: DC
  Administered 2024-02-19: 40 mg via ORAL
  Filled 2024-02-18: qty 2

## 2024-02-18 MED ORDER — OSMOLITE 1.5 CAL PO LIQD: 1000.0000 mL | ORAL | Status: DC

## 2024-02-18 MED ORDER — PROSOURCE TF20 ENFIT COMPATIBL EN LIQD: 60.0000 mL | Freq: Every day | ENTERAL | Status: DC

## 2024-02-18 MED ORDER — FREE WATER: 200.0000 mL | Freq: Three times a day (TID) | Status: DC

## 2024-02-18 MED ORDER — FOLIC ACID 1 MG PO TABS
1.0000 mg | ORAL_TABLET | Freq: Every day | ORAL | Status: DC
Start: 2024-02-18 — End: 2024-08-13

## 2024-02-18 MED ORDER — VITAMIN D 50 MCG (2000 UT) PO TABS: 2000.0000 [IU] | ORAL_TABLET | Freq: Every day | ORAL | Status: AC

## 2024-02-18 MED ORDER — ASPIRIN 81 MG PO TBEC: 81.0000 mg | DELAYED_RELEASE_TABLET | Freq: Every day | ORAL | Status: AC

## 2024-02-18 MED ORDER — DASATINIB 100 MG PO TABS: 100.0000 mg | ORAL_TABLET | Freq: Every day | ORAL | Status: AC

## 2024-02-18 MED ORDER — METHOCARBAMOL 750 MG PO TABS: 750.0000 mg | ORAL_TABLET | Freq: Four times a day (QID) | ORAL | Status: DC | PRN

## 2024-02-18 MED ORDER — TORSEMIDE 40 MG PO TABS: 20.0000 mg | ORAL_TABLET | Freq: Every day | ORAL | Status: DC

## 2024-02-18 MED ORDER — FAMOTIDINE 40 MG PO TABS: 40.0000 mg | ORAL_TABLET | Freq: Every day | ORAL | Status: AC

## 2024-02-18 MED ORDER — FUROSEMIDE 10 MG/ML IJ SOLN
40.0000 mg | Freq: Two times a day (BID) | INTRAMUSCULAR | Status: AC
  Administered 2024-02-18: 40 mg via INTRAVENOUS
  Filled 2024-02-18: qty 4

## 2024-02-18 MED ORDER — ALLOPURINOL 300 MG PO TABS: 300.0000 mg | ORAL_TABLET | Freq: Every day | ORAL | Status: AC

## 2024-02-18 MED ORDER — HYDROCODONE-ACETAMINOPHEN 5-325 MG PO TABS: 1.0000 | ORAL_TABLET | Freq: Four times a day (QID) | ORAL | 0 refills | Status: DC | PRN

## 2024-02-18 NOTE — Plan of Care (Signed)
  Problem: Metabolic: Goal: Ability to maintain appropriate glucose levels will improve Outcome: Progressing   Problem: Nutritional: Goal: Maintenance of adequate nutrition will improve Outcome: Progressing Goal: Progress toward achieving an optimal weight will improve Outcome: Progressing   Problem: Skin Integrity: Goal: Risk for impaired skin integrity will decrease Outcome: Progressing   Problem: Activity: Goal: Risk for activity intolerance will decrease Outcome: Progressing   Problem: Nutrition: Goal: Adequate nutrition will be maintained Outcome: Progressing   Problem: Coping: Goal: Level of anxiety will decrease Outcome: Progressing   Problem: Elimination: Goal: Will not experience complications related to bowel motility Outcome: Progressing Goal: Will not experience complications related to urinary retention Outcome: Progressing   Problem: Pain Managment: Goal: General experience of comfort will improve and/or be controlled Outcome: Progressing

## 2024-02-18 NOTE — Plan of Care (Signed)

## 2024-02-18 NOTE — Discharge Summary (Signed)
 Physician Discharge Summary  Kyle Montoya RUE:[id number] DOB: [birth year] DOA: 02/05/2024  PCP: Teofilo Fellers, NP  Admit date: 02/05/2024 Discharge date: 02/19/2024  Time spent: 45 minutes  Recommendations for Outpatient Follow-up:  SNF for short-term rehab Outpatient palliative care CBC/BMP in 1 week   Discharge Diagnoses:  Aspiration pneumonia Septic shock Severe oropharyngeal dysphagia CML Delirium Alcohol withdrawal   Acute on chronic diastolic CHF (congestive heart failure) (HCC)   Alcohol withdrawal (HCC)   HTN (hypertension)   AKI (acute kidney injury) (HCC)   CAD (coronary artery disease)   COPD with asthma (HCC)   Type 2 diabetes mellitus with hyperlipidemia (HCC)   Ascending aortic aneurysm (HCC)   Carcinoma of biliary tract (HCC)   Malnutrition of moderate degree   Discharge Condition: Fair  Diet recommendation: N.p.o., tube feeds, okay to have sips of water and ice chips by Mouth  Filed Weights   02/17/24 0439 02/18/24 0500 02/19/24 0500  Weight: 86.2 kg 85.1 kg 87.3 kg    History of present illness:  73/M w diastolic CHF, COPD, EtOH abuse, history of multiple cancers namely lung cancer sp resection and cholangiocarcinoma presented to the ED with generalized weakness and lower extremity edema. In the ER he was hypotensive with lower extremity edema, labs noted hyponatremia, creatinine 1.5, mildly elevated LFTs, BNP 149, troponin 56, lactic acid 1.9, chest x-ray with pulmonary vascular congestion, pleural effusion -Fluid resuscitated, also treated with antibiotics and midodrine   - 5/16 developed EtOH withdrawal, started on phenobarbital , continued vasopressor support  - 5/17 weaned off vasopressors  -5/18 transfer to [facility].  -5/19 clinically improving, delirium, required mittens -5/20 noted to have dysphagia and not able to have enteral po nutrition. Ordered cortrak - 5/21: Cortrak placed, tube feeds started, delirioum - 5/22: Palliative care consulted,  goals of care discussions ongoing -5/23: More delirium -5/24: Mental status improving - 5/25, repeat family meeting, decision made to proceed with PEG -5/27: PEG placed in interventional radiology  Hospital Course:   Septic shock, resolved Right lower lobe/aspiration pneumonia -Completed antibiotic course -Dysphagia, see below   Severe dysphagia - On swallowing evaluation he was noted to have large flowing anterior osteophytes C2-C5 producing obstruction to his swallowing.  - Despite being awake, continued to do poorly with swallowing assessment, discussed with daughter, cortrak placed 5/21, now on tube feeds, requested palliative care evaluation on account of ongoing issues with delirium, numerous comorbidities etc. and facilitate further discussion regarding PEG tube - Appreciate palliative assistance, goals of care discussions ongoing, after much deliberation, back-and-forth discussions patient and family confirmed decision 5/25 to proceed with PEG tube - IR consulted, PEG tube placed 5/27, tolerating tube feeds -Discharged to SNF for short-term rehab   Acute on chronic diastolic CHF (congestive heart failure) (HCC) Echocardiogram with preserved LV systolic function EF 65 to 70%, RV systolic function preserved, no significant valvular disease.  - Cut down midodrine  - Diuresed with IV Lasix , volume status is improving, transition to back to oral torsemide , please check BMP in 1 week   Iron deficiency anemia - Given IV iron - Is not in clinical condition that is appropriate for further evaluation at this time, recommend GI follow-up - Hemoglobin slowly improving   Alcohol withdrawal (HCC) - Resolved, now off phenobarbital  Continue thiamine  and multivitamins    Delirium, agitation - Intermittent delirium, overall improving now, continue Qpm Seroquel - Haldol as needed  History of CML Resume Dasatinib  at discharge   HTN (hypertension) Continue midodrine  for blood pressure  control.  AKI (acute kidney injury) (HCC) Hyponatremia. hypokalemia - Resolved   CAD (coronary artery disease) No chest pain, no acute coronary syndrome    COPD with asthma (HCC) Stable Continue with bronchodilator therapy    Type 2 diabetes mellitus with hyperlipidemia (HCC) Stable, hold off on insulin    Ascending aortic aneurysm (HCC) Follow up as outpatient    Carcinoma of biliary tract (HCC) Follow up as outpatient.   Discharge Exam: Vitals:   02/19/24 0357 02/19/24 0736  BP: 105/71 101/64  Pulse: 70 70  Resp: 16 18  Temp: 98.5 F (36.9 C) 98 F (36.7 C)  SpO2: 96% 98%   Gen: Awake, Alert, Oriented X 3,  HEENT: no JVD, cortrak Lungs: Good air movement bilaterally, CTAB CVS: S1S2/RRR Abd: soft, Non tender, non distended, BS present, PEG noted Extremities: No edema Skin: no new rashes on exposed skin   Discharge Instructions   Discharge Instructions     Increase activity slowly   Complete by: As directed    No wound care   Complete by: As directed       Allergies as of 02/19/2024       Reactions   Pneumococcal Vaccines Swelling        Medication List     STOP taking these medications    CALCIUM 500 PO   furosemide  80 MG tablet Commonly known as: LASIX    irbesartan -hydrochlorothiazide  150-12.5 MG tablet Commonly known as: AVALIDE   omeprazole  40 MG capsule Commonly known as: PRILOSEC   thiamine  100 MG tablet Commonly known as: Vitamin B-1       TAKE these medications    albuterol  108 (90 Base) MCG/ACT inhaler Commonly known as: VENTOLIN  HFA Inhale 2 puffs into the lungs every 6 (six) hours as needed for wheezing or shortness of breath.   allopurinol  300 MG tablet Commonly known as: ZYLOPRIM  Place 1 tablet (300 mg total) into feeding tube daily. What changed: how to take this   aspirin  EC 81 MG tablet Take 1 tablet (81 mg total) by mouth daily. Swallow whole.   dasatinib  100 MG tablet Commonly known as: Sprycel  Place  1 tablet (100 mg total) into feeding tube daily. What changed: how to take this   diclofenac Sodium 1 % Gel Commonly known as: VOLTAREN Apply 2 g topically at bedtime as needed (Pain).   famotidine  40 MG tablet Commonly known as: PEPCID  Place 1 tablet (40 mg total) into feeding tube daily.   feeding supplement (OSMOLITE 1.5 CAL) Liqd Place 1,000 mLs into feeding tube continuous. At  10ml/hr   feeding supplement (PROSource TF20) liquid Place 60 mLs into feeding tube daily.   fluticasone  50 MCG/ACT nasal spray Commonly known as: FLONASE  Place 2 sprays into both nostrils daily.   folic acid  1 MG tablet Commonly known as: FOLVITE  Place 1 tablet (1 mg total) into feeding tube daily. What changed: how to take this   free water  Soln Place 200 mLs into feeding tube every 8 (eight) hours.   HYDROcodone -acetaminophen  5-325 MG tablet Commonly known as: NORCO/VICODIN Place 1 tablet into feeding tube every 6 (six) hours as needed for moderate pain (pain score 4-6). What changed:  how to take this when to take this   methocarbamol  750 MG tablet Commonly known as: ROBAXIN  Place 1 tablet (750 mg total) into feeding tube every 6 (six) hours as needed for muscle spasms. What changed: how to take this   midodrine  5 MG tablet Commonly known as: PROAMATINE  Place 2 tablets (10 mg  total) into feeding tube 2 (two) times daily with a meal. What changed:  how much to take how to take this when to take this   multivitamin with minerals Tabs tablet Place 1 tablet into feeding tube daily. What changed: how to take this   polyethylene glycol 17 g packet Commonly known as: MIRALAX  / GLYCOLAX  Place 17 g into feeding tube daily as needed for moderate constipation. What changed: how to take this   tamsulosin  0.4 MG Caps capsule Commonly known as: FLOMAX  Take 2 capsules (0.8 mg total) by mouth daily.   Torsemide  40 MG Tabs Place 20 mg into feeding tube daily. What changed:  medication  strength how to take this   Trelegy Ellipta 100-62.5-25 MCG/INH Aepb Generic drug: Fluticasone -Umeclidin-Vilant Inhale 1 puff into the lungs daily at 6 (six) AM.   Vitamin D  50 MCG (2000 UT) tablet Place 1 tablet (2,000 Units total) into feeding tube daily. What changed: how to take this       Allergies  Allergen Reactions   Pneumococcal Vaccines Swelling      The results of significant diagnostics from this hospitalization (including imaging, microbiology, ancillary and laboratory) are listed below for reference.    Significant Diagnostic Studies: IR GASTROSTOMY TUBE MOD SED Result Date: 02/17/2024 INDICATION: Poor oral intake in a patient with severe dysplasia EXAM: Percutaneous gastrostomy tube placement MEDICATIONS: Ancef  2 g IV; Antibiotics were administered within 1 hour of the procedure. Glucagon 0.5 mg IV ANESTHESIA/SEDATION: Moderate (conscious) sedation was employed during this procedure. A total of Versed  1 mg and Fentanyl  25 mcg was administered intravenously by the radiology nurse. Total intra-service moderate Sedation Time: See epic chart minutes. The patient's level of consciousness and vital signs were monitored continuously by radiology nursing throughout the procedure under my direct supervision. CONTRAST:  See epic chart - administered into the gastric lumen. FLUOROSCOPY: Radiation Exposure Index (as provided by the fluoroscopic device): See epic chart mGy Kerma COMPLICATIONS: None immediate. PROCEDURE: Informed written consent was obtained from the patient after a thorough discussion of the procedural risks, benefits and alternatives. All questions were addressed. Maximal Sterile Barrier Technique was utilized including caps, mask, sterile gowns, sterile gloves, sterile drape, hand hygiene and skin antiseptic. A timeout was performed prior to the initiation of the procedure. Patient patient supine position on the IR Chi. Epigastric region was prepped and draped in usual  sterile fashion. Ultrasound was used to evaluate right upper quadrant delineate the margins of the left lobe of the liver. The nasogastric tube with an inflated which the air under fluoroscopic guidance after the tongue was administered. Lidocaine  was infiltrated from the skin to the stomach under fluoroscopic guidance and then 2 small incisions were made in the epigastric region in order to avoid gastropexy sutures. After 2 gastropexy sutures were deployed, a larger region was infiltrated with 1% lidocaine  for the gastrostomy tube insertion. The incision was made and a 7 cm Yueh needle was advanced under fluoroscopy and once intraluminal in the stomach contrast was administered demonstrating position by outlining gastric rugae with contrast. The needle was removed and an Amplatz wire was advanced through the cannula. The access site was then dilated using a 8 mm x 10 cm below and then advancing the 18 French gastrostomy tube over the balloon into the stomach. The balloon was deflated and removed through the gastrostomy tube as the retention balloon was infiltrated with normal saline. Retention suture was applied to the [facility] disc. Contrast was then injected through the gastrostomy  tube demonstrating position under fluoroscopy. Gastrostomy tube was then flushed with normal saline. IMPRESSION: Satisfactory placement of an 22 French gastrostomy tube. Okay to use gastrostomy tube 1 hour post placement. Electronically Signed   By: Susan Ensign   On: 02/17/2024 16:55   DG Abd Portable 1V Result Date: 02/17/2024 CLINICAL DATA:  Poor appetite. EXAM: PORTABLE ABDOMEN - 1 VIEW COMPARISON:  Radiograph 02/11/2024 FINDINGS: Feeding tube extends into the mid stomach. Interval clearance of barium from the colon. No dilated loops of large or small bowel identified. IMPRESSION: 1. Feeding tube in stomach. 2. Nonobstructive bowel gas pattern. Electronically Signed   By: Deboraha Fallow M.D.   On: 02/17/2024 10:10   CT  ABDOMEN WO CONTRAST Result Date: 02/16/2024 CLINICAL DATA:  Dysphagia EXAM: CT ABDOMEN WITHOUT CONTRAST TECHNIQUE: Multidetector CT imaging of the abdomen was performed following the standard protocol without IV contrast. RADIATION DOSE REDUCTION: This exam was performed according to the departmental dose-optimization program which includes automated exposure control, adjustment of the mA and/or kV according to patient size and/or use of iterative reconstruction technique. COMPARISON:  None Available. FINDINGS: Lower chest: Large bilateral pleural effusions. Volume loss at the bases above the effusions. Small pericardial effusion. Hepatobiliary: No focal liver abnormality is seen. Status post cholecystectomy. No biliary dilatation. Pancreas: Unremarkable. No pancreatic ductal dilatation or surrounding inflammatory changes. Spleen: Normal in size without focal abnormality. Adrenals/Urinary Tract: Adrenal glands are unremarkable. Kidneys are normal, without renal calculi, focal lesion, or hydronephrosis. Stomach/Bowel: Feeding tube tip is in the stomach. Vascular/Lymphatic: Aortic atherosclerosis. No enlarged abdominal lymph nodes. Other: No abdominal wall hernia or abnormality. Musculoskeletal: No acute or significant osseous findings. IMPRESSION: 1. Large bilateral pleural effusions. 2. Small pericardial effusion. 3. No acute abdominal pathology identified. 4. Aortic atherosclerosis (ICD10-I70.0). Electronically Signed   By: Sydell Eva M.D.   On: 02/16/2024 08:17   DG Abd Portable 1V Result Date: 02/11/2024 CLINICAL DATA:  Feeding tube placement. EXAM: PORTABLE ABDOMEN - 1 VIEW COMPARISON:  None Available. FINDINGS: Tip of the weighted enteric tube below the diaphragm in the midline in the region of the distal stomach. No bowel dilatation to suggest obstruction. There is barium in the colon from recent barium swallow. IMPRESSION: Tip of the weighted enteric tube below the diaphragm in the region of the  distal stomach. Electronically Signed   By: Chadwick Colonel M.D.   On: 02/11/2024 15:23   ECHOCARDIOGRAM LIMITED Result Date: 02/10/2024    ECHOCARDIOGRAM LIMITED REPORT   Patient Name:   Kyle Montoya Date of Exam: 02/10/2024 Medical Rec #:  [id number]      Height:       72.0 in Accession #:    [id number]     Weight:       190.0 lb Date of Birth:  [birth year]      BSA:          2.085 m Patient Age:    73 years       BP:           119/80 mmHg Patient Gender: M              HR:           77 bpm. Exam Location:  Inpatient Procedure: Limited Echo and Limited Color Doppler (Both Spectral and Color Flow            Doppler were utilized during procedure). Indications:    Dyspnea R06.00  History:        Patient has  prior history of Echocardiogram examinations, most                 recent 12/02/2023. CHF, CAD, PAD and COPD,                 Signs/Symptoms:Hypotension; Risk Factors:Hypertension, Diabetes                 and Dyslipidemia.  Sonographer:    Terrilee Few RCS Referring Phys: ARRIEN, MAURICIO, DANIEL IMPRESSIONS  1. Left ventricular ejection fraction, by estimation, is 65 to 70%. The left ventricle has normal function. The left ventricle has no regional wall motion abnormalities. Left ventricular diastolic parameters were normal.  2. Right ventricular systolic function is normal. The right ventricular size is normal. Tricuspid regurgitation signal is inadequate for assessing PA pressure.  3. No evidence of mitral valve regurgitation. No evidence of mitral stenosis.  4. The aortic valve is tricuspid. There is mild calcification of the aortic valve. There is mild thickening of the aortic valve. Aortic valve regurgitation is moderate. Aortic valve sclerosis/calcification is present, without any evidence of aortic stenosis.  5. Aortic dilatation noted. There is mild dilatation of the aortic root, measuring 41 mm. There is mild dilatation of the ascending aorta, measuring 42 mm. FINDINGS  Left Ventricle: Left  ventricular ejection fraction, by estimation, is 65 to 70%. The left ventricle has normal function. The left ventricle has no regional wall motion abnormalities. The left ventricular internal cavity size was normal in size. There is  no left ventricular hypertrophy. Left ventricular diastolic parameters were normal. Normal left ventricular filling pressure. Right Ventricle: The right ventricular size is normal. Right ventricular systolic function is normal. Tricuspid regurgitation signal is inadequate for assessing PA pressure. Pericardium: There is no evidence of pericardial effusion. Mitral Valve: There is mild thickening of the anterior mitral valve leaflet(s). There is mild calcification of the anterior mitral valve leaflet(s). No evidence of mitral valve stenosis. Aortic Valve: The aortic valve is tricuspid. There is mild calcification of the aortic valve. There is mild thickening of the aortic valve. Aortic valve regurgitation is moderate. Aortic valve sclerosis/calcification is present, without any evidence of aortic stenosis. Aortic valve peak gradient measures 7.5 mmHg. Pulmonic Valve: The pulmonic valve was grossly normal. Pulmonic valve regurgitation is not visualized. No evidence of pulmonic stenosis. Aorta: Aortic dilatation noted. There is mild dilatation of the aortic root, measuring 41 mm. There is mild dilatation of the ascending aorta, measuring 42 mm. Venous: The inferior vena cava was not well visualized. LEFT VENTRICLE PLAX 2D LVIDd:         4.80 cm   Diastology LVIDs:         2.90 cm   LV e' medial:    7.72 cm/s LV PW:         1.10 cm   LV E/e' medial:  9.5 LV IVS:        0.80 cm   LV e' lateral:   9.57 cm/s LVOT diam:     2.40 cm   LV E/e' lateral: 7.7 LV SV:         96 LV SV Index:   46 LVOT Area:     4.52 cm  RIGHT VENTRICLE RV S prime:     15.00 cm/s TAPSE (M-mode): 1.6 cm LEFT ATRIUM         Index LA diam:    3.60 cm 1.73 cm/m  AORTIC VALVE AV Area (Vmax): 3.40 cm AV Vmax:  137.00  cm/s AV Peak Grad:   7.5 mmHg LVOT Vmax:      103.00 cm/s LVOT Vmean:     64.500 cm/s LVOT VTI:       0.212 m  AORTA Ao Root diam: 4.10 cm Ao Asc diam:  4.20 cm MITRAL VALVE MV Area (PHT): 4.60 cm    SHUNTS MV Decel Time: 165 msec    Systemic VTI:  0.21 m MV E velocity: 73.40 cm/s  Systemic Diam: 2.40 cm MV A velocity: 68.10 cm/s MV E/A ratio:  1.08 Mihai Croitoru MD Electronically signed by Luana Rumple MD Signature Date/Time: 02/10/2024/5:14:47 PM    Final    DG Swallowing Func-Speech Pathology Result Date: 02/09/2024 Table formatting from the original result was not included. Images from the original result were not included. Modified Barium Swallow Study Patient Details Name: Toron Bowring MRN: [id number] Date of Birth: February 27, 1951 Today's Date: 02/09/2024 HPI/PMH: HPI: Patient is a 73 y.o. male with PMH: ETOH abuse, COPD, HTN, heart failure, ascending aortic aneurysm, h/o lung cancer s/p resection and cholangiocarcinoma. 2022 EGD reported that the distal esophagus was moderately tortuous. He presented to the hospital on 02/05/2024 with generalized weakness and lower extremity edema. On 5/16 he started withdrawing form alcohol and started on phenobarb and required vasopressor support. He was found to have a RLL PNA. SLP swallow evaluation ordered on 02/08/24 due to RN noting patient to exhibit coughing when drinking liquids. Review of records shows Dec 2009 DG cervical spine:  Severe degenerative disc disease changes.  Fusion from C2-C5  anteriorly due to flowing osteophytes. Clinical Impression: Pt presents with flowing anterior osteophytes from C2-C5 (documentated in Dec 2009 - no further imaging of cervical vertebrae could be found subsequently).  These create a structural impediment to swallowing - obstructing epiglottic closure over the larynx, creating narrow pyriform sinuses (smaller "holding bay" for material), allowing residue to accumulate superior to epiglottis and within hypopharynx.  Materials spill  into larynx/airway before, during, and after the swallow - thin liquids most likely to be aspirated. There is a reliable cough in response to aspiration, but it is weak and not sufficient to eject aspirate/penetrant from trachea and larynx.  Attempted to recline pt to redirect flow of barium into the esophagus and away from trachea, but this was not effective.  Mr. Melott has lived with these large osteophytes for many years, but has decompensated and is unable to protect the airway and/or has more of the comorbidities that make him susceptible to aspiration pna. It is certainly possible that with improved mental status/awareness his function will improve.  D/W Dr. Arrien - for now, continue NPO except meds crushed with purees and ice chips after oral care. SLP will follow.  DIGEST Swallow Severity Rating*             Safety:            2             Efficiency: 2             Overall Pharyngeal Swallow Severity: 2 - moderate  1: mild; 2: moderate; 3: severe; 4: profound *The Dynamic Imaging Grade of Swallowing Toxicity is standardized for the head and neck cancer population, however, demonstrates promising clinical applications across populations to standardize the clinical rating of pharyngeal swallow safety and severity.  Factors that may increase risk of adverse event in presence of aspiration Roderick Civatte & Jessy Morocco 2021): Factors that may increase risk of adverse event in presence of  aspiration Roderick Civatte & Jessy Morocco 2021): Respiratory or GI disease; Weak cough; Aspiration of thick, dense, and/or acidic materials Recommendations/Plan: Swallowing Evaluation Recommendations Swallowing Evaluation Recommendations Recommendations: NPO; NPO except meds Medication Administration: Crushed with puree Oral care recommendations: Oral care QID (4x/day); Oral care before ice chips/water  Treatment Plan Treatment Plan Treatment recommendations: Therapy as outlined in treatment plan below Follow-up recommendations: Other (comment) (tba)  Functional status assessment: Patient has had a recent decline in their functional status and demonstrates the ability to make significant improvements in function in a reasonable and predictable amount of time. Treatment frequency: Min 2x/week Treatment duration: 2 weeks Interventions: Patient/family education; Trials of upgraded texture/liquids; Diet toleration management by SLP Recommendations Recommendations for follow up therapy are one component of a multi-disciplinary discharge planning process, led by the attending physician.  Recommendations may be updated based on patient status, additional functional criteria and insurance authorization. Assessment: Orofacial Exam: Orofacial Exam Oral Cavity: Oral Hygiene: WFL Oral Cavity - Dentition: Edentulous Orofacial Anatomy: WFL Anatomy: Anatomy: Suspected cervical osteophytes (Fusion from C2-C5 anteriorly due to flowing osteophytes (per DG cervical spine 2009)) Boluses Administered: Boluses Administered Boluses Administered: Thin liquids (Level 0); Mildly thick liquids (Level 2, nectar thick); Puree (dys 2)  Oral Impairment Domain: Oral Impairment Domain Lip Closure: No labial escape Tongue control during bolus hold: Not tested Bolus preparation/mastication: Slow prolonged chewing/mashing with complete recollection Bolus transport/lingual motion: Slow tongue motion Oral residue: Trace residue lining oral structures Initiation of pharyngeal swallow : Pyriform sinuses  Pharyngeal Impairment Domain: Pharyngeal Impairment Domain Soft palate elevation: No bolus between soft palate (SP)/pharyngeal wall (PW) Laryngeal elevation: Complete superior movement of thyroid cartilage with complete approximation of arytenoids to epiglottic petiole Anterior hyoid excursion: Complete anterior movement Epiglottic movement: Partial inversion Laryngeal vestibule closure: Incomplete, narrow column air/contrast in laryngeal vestibule Pharyngeal stripping wave : Present - complete  Pharyngeal contraction (A/P view only): N/A Pharyngoesophageal segment opening: Partial distention/partial duration, partial obstruction of flow Tongue base retraction: Narrow column of contrast or air between tongue base and PPW Pharyngeal residue: Collection of residue within or on pharyngeal structures Location of pharyngeal residue: Valleculae; Pharyngeal wall; Pyriform sinuses  Esophageal Impairment Domain: Esophageal Impairment Domain Esophageal clearance upright position: Esophageal retention Pill: Pill Consistency administered: -- (NT) Penetration/Aspiration Scale Score: Penetration/Aspiration Scale Score 3.  Material enters airway, remains ABOVE vocal cords and not ejected out: Puree 5.  Material enters airway, CONTACTS cords and not ejected out: Mildly thick liquids (Level 2, nectar thick) 7.  Material enters airway, passes BELOW cords and not ejected out despite cough attempt by patient: Thin liquids (Level 0) Compensatory Strategies: Compensatory Strategies Compensatory strategies: Yes Reclining posture: Ineffective   General Information: Caregiver present: No  Diet Prior to this Study: NPO   Temperature : Normal   No data recorded  No data recorded  History of Recent Intubation: No  Behavior/Cognition: Alert; Requires cueing; Lethargic/Drowsy; Confused Self-Feeding Abilities: Other (Comment) (mitts on during MBS -fed by examiner) Baseline vocal quality/speech: Dysphonic Volitional Cough: Able to elicit Volitional Swallow: Able to elicit No data recorded Goal Planning: Prognosis for improved oropharyngeal function: Good No data recorded No data recorded Patient/Family Stated Goal: nothing stated and no family present No data recorded Pain: Pain Assessment Pain Assessment: No/denies pain Faces Pain Scale: 0 End of Session: Start Time:SLP Start Time (ACUTE ONLY): 1418 Stop Time: SLP Stop Time (ACUTE ONLY): 1444 Time Calculation:SLP Time Calculation (min) (ACUTE ONLY): 26 min Charges: SLP Evaluations $ SLP  Speech Visit: 1 Visit SLP Evaluations $BSS  Swallow: 1 Procedure $MBS Swallow: 1 Procedure $Swallowing Treatment: 1 Procedure SLP visit diagnosis: SLP Visit Diagnosis: Dysphagia, pharyngeal phase (R13.13) Past Medical History: Past Medical History: Diagnosis Date  Abnormal colonoscopy 02/23/2021  Abnormal transaminases 02/27/2022  Acute diastolic (congestive) heart failure (HCC) 10/08/2023  Adenomatous polyp of descending colon 02/23/2021  Adverse drug reaction, initial encounter 07/04/2016  07/02/2016: Prevnar 23 vaccine given in right arm  07/02/2016: localized swelling  10/11/20167: Increase in swelling from shoulder to inner aspect of elbow.Red, warm and firm to touch.  No lip or airway swelling or stridor.  2+ brachial and radial pulses right arm.  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, under care by Dr. Kirk Peper Oncologist, has f/u every 6-12 months.  Cataract   bilateral sx  Cervical osteoarthritis 10/18/2014  Cholangiocarcinoma of biliary tract (HCC)   Chronic cough 01/31/2015  Chronic myelogenous leukemia (HCC)   Chronic pain 01/31/2015  CML (chronic myelocytic leukemia) (HCC) 10/18/2014  Overview:   Follows with [facility], Dr. Almer Jacobson. On dasatinib , last WBC 9.6.    Compulsive tobacco user syndrome 01/31/2015  Congestive heart failure (CHF) (HCC) 09/26/2023  COPD (chronic obstructive pulmonary disease) (HCC)   uses inhaler  COPD with asthma (HCC) 01/22/2013  Severe obstruction on spirometry FeV1 54% FeV1/FVC 51% 01/31/2015  CXR NAD 01/25/15    Diuretic-induced hypokalemia 05/23/2023  DJD (degenerative joint disease)    Dyslipidemia   Family history of colonic polyps   Family history of prostate cancer   Full thickness rotator cuff tear 08/08/2020  Gout   High coronary artery calcium score 05/30/2023  History of colon polyps 02/23/2021  History of colonic polyps 10/01/2021  IMO SNOMED Dx Update Oct 2024    HLD (hyperlipidemia) 01/31/2015  HTN (hypertension)   on meds  Hypercalcemia 08/10/2020  Hypercholesterolemia   Hypocalcemia 08/27/2021  Hyponatremia 09/19/2023  Impingement syndrome of both shoulders 03/31/2020  Lung cancer (HCC)   Myelogenous leukemia (HCC)   Non-small cell carcinoma of lung (HCC) 01/31/2015  02/13/2015 CT Chest no evidence of recurrence in lung cancer  Stage I adenocarcinoma diagnosed with left upper lobectomy in February 2016    OA (osteoarthritis)   Orthostatic hypotension 10/10/2023  PAD (peripheral artery disease) (HCC)   Personal history of gastric ulcers 02/23/2021  Renal insufficiency   Shoulder pain, left 03/29/2020  Shoulder pain, right 03/29/2020  Tendonitis   patellar  Thoracic aortic aneurysm (HCC) 06/27/2022  Wheezing 01/31/2015 Past Surgical History: Past Surgical History: Procedure Laterality Date  BIOPSY  04/30/2021  Procedure: BIOPSY;  Surgeon: Normie Becton., MD;  Location: [facility];  Service: Gastroenterology;;  Bowel duct surg  2014  Bowel cancer  CARDIAC CATHETERIZATION  2000  CHOLECYSTECTOMY    COLONOSCOPY  06/23/2015  mild sigmoid diverticulosis. small external and internal hemorrhoids. otherwise normal colonoscopy to terminal ileum  COLONOSCOPY WITH PROPOFOL  N/A 04/30/2021  Procedure: COLONOSCOPY WITH PROPOFOL ;  Surgeon: Mansouraty, Albino Alu., MD;  Location: [facility];  Service: Gastroenterology;  Laterality: N/A;  ENDOSCOPIC MUCOSAL RESECTION N/A 04/30/2021  Procedure: ENDOSCOPIC MUCOSAL RESECTION;  Surgeon: Brice Campi Albino Alu., MD;  Location: [facility];  Service: Gastroenterology;  Laterality: N/A;  ESOPHAGOGASTRODUODENOSCOPY (  EGD) WITH PROPOFOL  N/A 04/30/2021   Procedure: ESOPHAGOGASTRODUODENOSCOPY (EGD) WITH PROPOFOL ;  Surgeon: Brice Campi Albino Alu., MD;  Location: [facility];  Service: Gastroenterology;  Laterality: N/A;  HEMOSTASIS CLIP PLACEMENT  04/30/2021  Procedure: HEMOSTASIS CLIP PLACEMENT;  Surgeon: Normie Becton., MD;  Location: [facility];  Service: Gastroenterology;;  LUNG CANCER SURGERY  09/2014  partial lobectomy  POLYPECTOMY  04/30/2021  Procedure: POLYPECTOMY;  Surgeon: Mansouraty, Albino Alu., MD;  Location: [facility];  Service: Gastroenterology;;  SHOULDER OPEN ROTATOR CUFF REPAIR Right 09/27/2020  Procedure: Right shoulder mini open rotator cuff repair, distal clavicle resection;  Surgeon: Orvan Blanch, MD;  Location: [facility];  Service: Orthopedics;  Laterality: Right;  90 mins Choice with Block anesthesia  SUBMUCOSAL LIFTING INJECTION  04/30/2021  Procedure: SUBMUCOSAL LIFTING INJECTION;  Surgeon: Normie Becton., MD;  Location: [facility];  Service: Gastroenterology;;  UPPER GASTROINTESTINAL ENDOSCOPY    WHIPPLE PROCEDURE  02/25/2013  Procedure: WHIPPLE PROCEDURE; Surgeon: Kirk Peper, MD; Location: [facility] MAIN OR; Service: General; Laterality: N/A; Amanda L. Beatris Lincoln, MA CCC/SLP Clinical Specialist - Acute Care SLP Acute Rehabilitation Services Office number [phone] Rheba Cedar 02/09/2024, 5:51 PM  DG Chest Port 1 View Result Date: 02/05/2024 CLINICAL DATA:  Shortness of breath. EXAM: PORTABLE CHEST 1 VIEW COMPARISON:  Chest [place]  dated 12/25/2023. FINDINGS: Small right pleural effusion and right lung base atelectasis or infiltrate. The left lung is clear. No pneumothorax. Stable cardiac silhouette. No acute osseous pathology. IMPRESSION: Small right pleural effusion and right lung base atelectasis or infiltrate. Electronically Signed   By: Angus Bark M.D.   On: 02/05/2024 17:16    Microbiology: No results found for this or any previous visit (from the past 240 hours).   Labs: Basic  Metabolic Panel: Recent Labs  Lab 02/13/24 0356 02/14/24 0245 02/16/24 0219 02/17/24 0228 02/18/24 0251 02/19/24 0539  NA 138 138 135 135 133* 132*  K 3.6 4.3 4.0 3.8 4.0 4.2  CL 109 108 106 105 106 104  CO2 24 22 26 23 23 24   GLUCOSE 110* 92 117* 90 116* 119*  BUN 10 12 13 11 9 9   CREATININE 0.66 0.66 0.57* 0.52* 0.60* 0.57*  CALCIUM 8.0* 8.1* 7.8* 7.8* 7.7* 7.6*  MG 1.9 2.0  --   --   --   --   PHOS 2.1* 2.3*  --   --   --   --    Liver Function Tests: No results for input(s): "AST", "ALT", "ALKPHOS", "BILITOT", "PROT", "ALBUMIN " in the last 168 hours. No results for input(s): "LIPASE", "AMYLASE" in the last 168 hours. No results for input(s): "AMMONIA" in the last 168 hours. CBC: Recent Labs  Lab 02/13/24 0356 02/14/24 0532 02/16/24 0219 02/17/24 0228 02/18/24 0251  WBC 3.9* 6.3 5.1 3.9* 4.2  HGB 7.3* 7.4* 7.1* 7.0* 7.7*  HCT 21.8* 22.1* 21.7* 21.5* 23.7*  MCV 107.4* 108.9* 108.5* 107.5* 107.7*  PLT 247 263 244 242 290   Cardiac Enzymes: No results for input(s): "CKTOTAL", "CKMB", "CKMBINDEX", "TROPONINI" in the last 168 hours. BNP: BNP (last 3 results) Recent Labs    12/02/23 0219 12/04/23 0037 02/05/24 1524  BNP 182.4* 319.4* 149.5*    ProBNP (last 3 results) No results for input(s): "PROBNP" in the last 8760 hours.  CBG: Recent Labs  Lab 02/18/24 1135 02/18/24 1716 02/18/24 2058 02/19/24 0040 02/19/24 0627  GLUCAP 90 103* 134* 122* 140*       Signed:  Deforest Fast MD.  [facility] 02/19/2024, 9:02 AM

## 2024-02-18 NOTE — Progress Notes (Signed)
 Nutrition Follow-up  DOCUMENTATION CODES:  Non-severe (moderate) malnutrition in context of social or environmental circumstances  INTERVENTION:  Re-initiate tube feeding via PEG: Osmolite 1.5 at 55 ml/h (1320 ml per day) May re-initiate at goal rate, as TF was off <24 hours and patient was tolerating at goal rate Prosource TF20 60 ml daily FWF q6 hours ( per day)   Provides 2060 kcal, 103 gm protein, 1806 ml free water daily (TF + FWF)    Continue thiamine /folic acid  r/t EtOH use Continue MVI w/ minerals Monitor SLP notes for diet advancement  NUTRITION DIAGNOSIS:  Moderate Malnutrition related to social / environmental circumstances (EtOH abuse and loss of his wife) as evidenced by percent weight loss, mild fat depletion, moderate muscle depletion. - remains applicable  GOAL:  Patient will meet greater than or equal to 90% of their needs - meeting w/ TF at goal rate  MONITOR:  TF tolerance, Diet advancement  REASON FOR ASSESSMENT:  Consult Enteral/tube feeding initiation and management  ASSESSMENT:  Pt with PMH significant for heart failure, EtOH abuse, COPD, HTN, AAA, hx lung CA s/p resection and cholangiocarcinoma. Presented with generalized weakness and lower extremity edema and found to have RLL PNA.  5/16 admitted 5/18 transfer out of ICU 5/19 MBS: NPO, severe dysphagia 5/21 Cortrak placed, TF initiated 5/26  tolerating TFs at goal rate 5/27 PEG placed, TF re-initiated  Delirium improving. PEG placed yesterday. TF re-initiated at goal rate, as they were not off long enough to warrant slow titration back up. Patient tolerating. Some pain at surgical site, however not significant.   Admit Weight: 91.8kg Current Weight: 85.1kg   Diuretics restarted yesterday. 8L net negative so far this admission. No edema on exam. Bowels moving.     Intake/Output Summary (Last 24 hours) at 02/18/2024 0916 Last data filed at 02/17/2024 1948 Gross per 24 hour  Intake  100 ml  Output 675 ml  Net -575 ml      Net IO Since Admission: -8,045.93 mL [02/18/24 0916]   Hgb trending down. Hemodilution suspected however GI consulted. Care team monitoring for need for transfusion.  Insulin  has not been administered d/t blood sugars trending within desirable range.   Drains/Lines: LUQ: PEG (18Fr) placed 5/27 Foley catheter UOP: + 1 unmeasured occurrence x24 hours  Meds: famotidine, furosemide , folic acid , SSI 0-6 q6, MVI, thiamine , IV ABX  Labs:  Na+ 135>133 (L) K+ 4.0 (wdl) Mg 1.7 (wdl) CBGs 90-116 x24 hours A1c 4.6 (01/2024)  Diet Order:   Diet Order             Diet NPO time specified  Diet effective now            EDUCATION NEEDS:  Not appropriate for education at this time  Skin:  Skin Assessment: Reviewed RN Assessment  Last BM:  5/26  Height:  Ht Readings from Last 1 Encounters:  02/06/24 6' (1.829 m)   Weight:  Wt Readings from Last 1 Encounters:  02/18/24 85.1 kg   Ideal Body Weight:  80.9 kg  BMI:  Body mass index is 25.44 kg/m.  Estimated Nutritional Needs:   Kcal:  1900-2100kcals  Protein:  90-105g  Fluid:  >1.9L/day  Con Decant MS, RD, LDN Registered Dietitian Clinical Nutrition RD Inpatient Contact Info in Amion

## 2024-02-18 NOTE — Progress Notes (Signed)
 Speech Language Pathology Treatment: Dysphagia  Patient Details Name: Kyle Montoya MRN: [id number] DOB: 24-Apr-1951 Today's Date: 02/18/2024 Time: 5621-3086 SLP Time Calculation (min) (ACUTE ONLY): 11 min  Assessment / Plan / Recommendation Clinical Impression  Mr. Thomley is awaiting D/C to SNF for rehab. PEG placed yesterday.  He was alert and interactive.  His swallowing appeared to be a bit improved today such that he could tolerate small, controlled sips of thin liquid without coughing. Fortunately, when he does aspirate he has a reliable cough response (I.e., he is not a silent aspirator), allowing staff to assess at bedside whether he is protecting his airway.  Recommend that pt be permitted to have small sips of his favorite liquids (thin), here and at [facility].  He would benefit from SLP f/u to work on effortful swallow, exercises to improve pharyngeal strength in order to  help overcome presence of osteophytes.   Spoke with his daughter Kyle Montoya over the phone to talk with her about updated recommendations-  she will f/u at SNF to ensure pt is able to drink small amounts of liquids.     HPI HPI: Patient is a 73 y.o. male admitted with RLL pna, sepsis, heart failure, alcohol withdrawal.  PMH: ETOH abuse, COPD, HTN, heart failure, ascending aortic aneurysm, h/o lung cancer s/p resection and cholangiocarcinoma. SLP swallow evaluation ordered on 02/08/24 due to RN noting patient to exhibit coughing when drinking liquids. Review of records shows Dec 2009 DG cervical spine:  Severe degenerative disc disease changes.  Fusion from C2-C5  anteriorly due to flowing osteophytes. MBS 5/19 moderate pharyngeal dysphagia due to obstruction by osteophytes. PEG 5/27 after much discussion with family and Palliative Medicine.      SLP Plan  Discharge SLP treatment due to (comment)      Recommendations for follow up therapy are one component of a multi-disciplinary discharge planning process, led by the  attending physician.  Recommendations may be updated based on patient status, additional functional criteria and insurance authorization.    Recommendations  Diet recommendations: NPO;Other(comment) (small sips of thin liquid; purees PRN) Liquids provided via: Cup;Straw Medication Administration: Via alternative means Supervision: Full supervision/cueing for compensatory strategies Compensations: Small sips/bites;Effortful swallow Postural Changes and/or Swallow Maneuvers: Seated upright 90 degrees                  Oral care QID   Frequent or constant Supervision/Assistance Dysphagia, pharyngeal phase (R13.13)     Discharge SLP treatment due to (comment)    Aliyana Dlugosz L. Beatris Lincoln, MA CCC/SLP Clinical Specialist - Acute Care SLP Acute Rehabilitation Services Office number [phone]  [name]  02/18/2024, 11:18 AM

## 2024-02-18 NOTE — Progress Notes (Signed)
 Palliative Medicine Progress Note   Patient Name: Kyle Montoya       Date: 02/18/2024 DOB: 02-26-1951  Age: 73 y.o. MRN#: [id number] Attending Physician: Deforest Fast, MD Primary Care Physician: Teofilo Fellers, NP Admit Date: 02/05/2024  Reason for Consultation/Follow-up: {Reason for Consult:23484}  HPI/Patient Profile: 73 y.o. male  with past medical history of chronic diastolic CHF, COPD, alcohol use disorder, and chronic myelogenous leukemia on Dasatinib . He also has a history history of stage 1a non-small lung cell cancer (2016) and stage 1a cholangiocarcinoma (2014), both treated with surgery alone and both with no evidence of disease recurrence on scans in October 2024.  He presented to the ED on 02/05/2024 with generalized weakness and lower extremity edema.  He was found to be hypotensive and was admitted with septic shock secondary to right lower lobe aspiration pneumonia.     Hospitalization has been complicated by alcohol withdrawal requiring phenobarbital  as well as severe dysphagia secondary to C2-C5 osteophytes.   Palliative Medicine has been consulted for  goals of care.   Subjective: Chart reviewed. Patient underwent PEG placement yesterday 5/27.  He ambulated 230 feet with mobility specialist earlier today.  Patient assessed at bedside.  He seems to be in good spirits today, telling me that he  Objective:  Physical Exam Vitals reviewed.  Constitutional:      General: He is not in acute distress.    Comments: Chronically ill-appearing  Pulmonary:     Effort: Pulmonary effort is normal.  Abdominal:     Comments: PEG tube in place  Neurological:     Mental Status: He is alert and oriented to person, place, and time.              Palliative Medicine Assessment &  Plan   Assessment: Principal Problem:   Right lower lobe pneumonia Active Problems:   COPD with asthma (HCC)   Carcinoma of biliary tract (HCC)   Ascending aortic aneurysm (HCC)   CAD (coronary artery disease)   Type 2 diabetes mellitus with hyperlipidemia (HCC)   HTN (hypertension)   AKI (acute kidney injury) (HCC)   Acute on chronic diastolic CHF (congestive heart failure) (HCC)   Alcohol withdrawal (HCC)   Malnutrition of moderate degree    Recommendations/Plan: Continue current supportive interventions Appreciate SLP recommendations Goal is for rehab to  improve functional status, so that patient can ultimately return home PMT will continue to follow  Goals of Care and Additional Recommendations: Limitations on Scope of Treatment: {Recommended Scope and Preferences:21019}  Code Status:   Prognosis:  {Palliative Care Prognosis:23504}  Discharge Planning: {Palliative dispostion:23505}  Care plan was discussed with ***  Thank you for allowing the Palliative Medicine Team to assist in the care of this patient.   ***   Wynetta Heckle, NP   Please contact Palliative Medicine Team phone at [phone] for questions and concerns.  For individual providers, please see AMION.

## 2024-02-18 NOTE — Progress Notes (Signed)
 Mobility Specialist Progress Note;   02/18/24 1021  Mobility  Activity Ambulated with assistance in hallway  Level of Assistance Contact guard assist, steadying assist  Assistive Device Four wheel walker  Distance Ambulated (ft) 230 ft  Activity Response Tolerated well  Mobility Referral Yes  Mobility visit 1 Mobility  Mobility Specialist Start Time (ACUTE ONLY) 1021  Mobility Specialist Stop Time (ACUTE ONLY) 1040  Mobility Specialist Time Calculation (min) (ACUTE ONLY) 19 min   Pt agreeable to mobility. Required MinG assistance for bed mobility and during ambulation for safety. Took 1x seated rest break d/t fatigue. VSS on RA. Pt returned back to bed with all needs met, alarm on.   Janit Meline Mobility Specialist Please contact via SecureChat or [employer] [phone]

## 2024-02-18 NOTE — Progress Notes (Signed)
 Physical Therapy Treatment Patient Details Name: Kyle Montoya MRN: [id number] DOB: 07/24/1951 Today's Date: 02/18/2024   History of Present Illness 73 y.o. male presents to [facility] 02/05/24 with generalized weakness and LE edema. Admitted to ICU (5/15-5/18) with septic shock due to RLL PNA 2/2 aspiration and hypotension. 5/16 withdraw from alcohol, CIWA protocol. Complicated with heart failure. PMHx:HFpEF, alcohol abuse, cervical DDD,Gout, Asthma, COPD, Hyponatremia, Pneumonia, HTN, Anemia, Ascending Aortic Aneurysm, Gastric Ulcer disease with H. Pylori, paroxysmal Atrial Fibrillation, Lung Cancer stage IA 2016 s/p resection, CML diagnosed 2015, Cholangiocarcinoma, B12 defeciency, Alcoholic Hepatitis    PT Comments  Pt resting in bed on arrival and agreeable to session with continued progress towards acute goals. Pt progressing gait this session with personal rollator with pt demonstrating safe use of all features as well as safe technique during seated rest on rollator seat. Pt continues to be limited by decreased activity tolerance requiring x2 seated rest breaks during gait this date, with 3-4/4 DOE noted. Educated pt on importance of continued mobility with pt verbalizing understanding. Pt continues to benefit from skilled PT services to progress toward functional mobility goals. Pt continues to benefit from skilled PT services to progress toward functional mobility goals.     If plan is discharge home, recommend the following: A lot of help with walking and/or transfers;A lot of help with bathing/dressing/bathroom;Assistance with cooking/housework;Assist for transportation;Help with stairs or ramp for entrance   Can travel by private vehicle     Yes  Equipment Recommendations  None recommended by PT    Recommendations for Other Services       Precautions / Restrictions Precautions Precautions: Fall Recall of Precautions/Restrictions: Impaired Precaution/Restrictions Comments:  PEG Restrictions Weight Bearing Restrictions Per Provider Order: No     Mobility  Bed Mobility Overal bed mobility: Needs Assistance Bed Mobility: Supine to Sit, Sit to Supine     Supine to sit: Min assist Sit to supine: Contact guard assist   General bed mobility comments: light min A to elevate trunk due to abdominal PEG site pain    Transfers Overall transfer level: Needs assistance Equipment used: Rollator (4 wheels), None Transfers: Sit to/from Stand Sit to Stand: Contact guard assist, From elevated surface           General transfer comment: CGA for safety, able to rise without AD support    Ambulation/Gait Ambulation/Gait assistance: Supervision, Contact guard assist, Min assist Gait Distance (Feet): 110 Feet (+ 51' + 25'  with seated rest, + 30' in room withotu UE support) Assistive device: Rollator (4 wheels) Gait Pattern/deviations: Step-through pattern, Decreased stride length, Shuffle Gait velocity: decr     General Gait Details: grossly sueprvision for safety, min A to steady without UE support during in room gait to bathroom. pt needing x2 seated rest breaks due to fatigue   Stairs             Wheelchair Mobility     Tilt Bed    Modified Rankin (Stroke Patients Only)       Balance Overall balance assessment: Needs assistance, Mild deficits observed, not formally tested Sitting-balance support: No upper extremity supported, Feet supported Sitting balance-Leahy Scale: Good     Standing balance support: Bilateral upper extremity supported, During functional activity, Reliant on assistive device for balance Standing balance-Leahy Scale: Poor Standing balance comment: reliant on UE support, able to maintain static standing at sink with single UE support  Communication Communication Communication: Impaired Factors Affecting Communication: Reduced clarity of speech  Cognition Arousal: Alert Behavior  During Therapy: WFL for tasks assessed/performed, Restless   PT - Cognitive impairments: No family/caregiver present to determine baseline, Sequencing, Problem solving, Safety/Judgement                         Following commands: Impaired Following commands impaired: Follows multi-step commands inconsistently    Cueing Cueing Techniques: Verbal cues, Tactile cues  Exercises      General Comments General comments (skin integrity, edema, etc.): VSS on RA      Pertinent Vitals/Pain Pain Assessment Pain Assessment: Faces Faces Pain Scale: Hurts a little bit Pain Location: PEG site Pain Descriptors / Indicators: Discomfort Pain Intervention(s): Monitored during session, Limited activity within patient's tolerance    Home Living                          Prior Function            PT Goals (current goals can now be found in the care plan section) Acute Rehab PT Goals Patient Stated Goal: to get better PT Goal Formulation: With patient Time For Goal Achievement: 02/26/24 Progress towards PT goals: Progressing toward goals    Frequency    Min 2X/week      PT Plan      Co-evaluation              AM-PAC PT "6 Clicks" Mobility   Outcome Measure  Help needed turning from your back to your side while in a flat bed without using bedrails?: A Little Help needed moving from lying on your back to sitting on the side of a flat bed without using bedrails?: A Little Help needed moving to and from a bed to a chair (including a wheelchair)?: A Little Help needed standing up from a chair using your arms (e.g., wheelchair or bedside chair)?: A Little Help needed to walk in hospital room?: A Little Help needed climbing 3-5 steps with a railing? : A Lot 6 Click Score: 17    End of Session   Activity Tolerance: Patient tolerated treatment well Patient left: with call bell/phone within reach;in bed;with bed alarm set Nurse Communication: Mobility  status PT Visit Diagnosis: Unsteadiness on feet (R26.81);Other abnormalities of gait and mobility (R26.89);Muscle weakness (generalized) (M62.81)     Time: 1348-1410 PT Time Calculation (min) (ACUTE ONLY): 22 min  Charges:    $Gait Training: 8-22 mins PT General Charges $$ ACUTE PT VISIT: 1 Visit                     Lori-Ann Lindfors R. PTA Acute Rehabilitation Services Office: [phone]   Agapito Horseman 02/18/2024, 3:32 PM

## 2024-02-18 NOTE — TOC Progression Note (Signed)
 Transition of Care [facility]) - Progression Note    Patient Details  Name: Kyle Montoya MRN: [id number] Date of Birth: 1951-04-04  [facility]) CM/SW Contact  Juliane Och, LCSW Phone Number: 02/18/2024, 12:59 PM  Clinical Narrative:     12:59 PM CSW relayed SNF options to patient. Patient accepted bed offer at [facility]. CSW relayed informed to patient's daughter, Maida Sciara, and [facility] SNF. SNF is to submit insurance authorization request today.  Expected Discharge Plan: Skilled Nursing Facility Barriers to Discharge: Insurance Authorization  Expected Discharge Plan and Services In-house Referral: Clinical Social Work   Post Acute Care Choice: Skilled Nursing Facility Living arrangements for the past 2 months: Single Family Home                                       Social Determinants of Health (SDOH) Interventions SDOH Screenings   Food Insecurity: No Food Insecurity (02/06/2024)  Housing: Low Risk  (02/06/2024)  Transportation Needs: No Transportation Needs (02/06/2024)  Utilities: Not At Risk (02/06/2024)  Social Connections: Moderately Isolated (02/06/2024)  Tobacco Use: Medium Risk (02/17/2024)    Readmission Risk Interventions    12/08/2023   10:22 AM 12/03/2023   10:44 AM 10/09/2023    3:26 PM  Readmission Risk Prevention Plan  Transportation Screening  Complete Complete  PCP or Specialist Appt within 5-7 Days   Complete  PCP or Specialist Appt within 3-5 Days Complete    Home Care Screening   Complete  Medication Review (RN CM)   Complete  HRI or Home Care Consult Complete Complete   Social Work Consult for Recovery Care Planning/Counseling  Complete   Palliative Care Screening Complete Not Applicable   Medication Review Oceanographer) Complete Complete

## 2024-02-19 DIAGNOSIS — R1312 Dysphagia, oropharyngeal phase: Secondary | ICD-10-CM | POA: Insufficient documentation

## 2024-02-19 DIAGNOSIS — R41841 Cognitive communication deficit: Secondary | ICD-10-CM | POA: Insufficient documentation

## 2024-02-19 DIAGNOSIS — K219 Gastro-esophageal reflux disease without esophagitis: Secondary | ICD-10-CM | POA: Insufficient documentation

## 2024-02-19 DIAGNOSIS — J690 Pneumonitis due to inhalation of food and vomit: Secondary | ICD-10-CM | POA: Insufficient documentation

## 2024-02-19 DIAGNOSIS — I2510 Atherosclerotic heart disease of native coronary artery without angina pectoris: Secondary | ICD-10-CM | POA: Insufficient documentation

## 2024-02-19 DIAGNOSIS — R1310 Dysphagia, unspecified: Secondary | ICD-10-CM | POA: Insufficient documentation

## 2024-02-19 LAB — BASIC METABOLIC PANEL WITH GFR
Anion gap: 4 — ABNORMAL LOW (ref 5–15)
BUN: 9 mg/dL (ref 8–23)
CO2: 24 mmol/L (ref 22–32)
Calcium: 7.6 mg/dL — ABNORMAL LOW (ref 8.9–10.3)
Chloride: 104 mmol/L (ref 98–111)
Creatinine, Ser: 0.57 mg/dL — ABNORMAL LOW (ref 0.61–1.24)
GFR, Estimated: >60 mL/min (ref >60)
Glucose, Bld: 119 mg/dL — ABNORMAL HIGH (ref 70–99)
Potassium: 4.2 mmol/L (ref 3.5–5.1)
Sodium: 132 mmol/L — ABNORMAL LOW (ref 135–145)

## 2024-02-19 LAB — GLUCOSE, CAPILLARY
Glucose-Capillary: 122 mg/dL — ABNORMAL HIGH (ref 70–99)
Glucose-Capillary: 140 mg/dL — ABNORMAL HIGH (ref 70–99)
Glucose-Capillary: 93 mg/dL (ref 70–99)

## 2024-02-19 MED ORDER — HYDROCODONE-ACETAMINOPHEN 5-325 MG PO TABS: 1.0000 | ORAL_TABLET | Freq: Four times a day (QID) | ORAL | 0 refills | Status: DC | PRN

## 2024-02-19 NOTE — TOC Transition Note (Addendum)
 Transition of Care [facility]) - Discharge Note   Patient Details  Name: Kyle Montoya MRN: [id number] Date of Birth: 28-Nov-1950  Transition of Care [facility]) CM/SW Contact:  Juliane Och, LCSW Phone Number: 02/19/2024, 11:44 AM   Clinical Narrative:     Patient will DC to: [facility] SNF Anticipated DC date: 02/19/2024 Family notified: Lus Salter; Daughter; [phone] Transport by: Lyna Sandhoff   Per MD patient ready for DC to [facility] SNF. RN to call report prior to discharge [phone]). RN, patient, patient's family, and facility notified of DC. Discharge Summary and FL2 sent to facility. DC packet on chart. Ambulance transport requested for patient at 11:42 for a 14:23 pick up. Patient and patient's daughter made aware of potential copay from ambulance transportation.   CSW will sign off for now as social work intervention is no longer needed. Please consult us  again if new needs arise.    Final next level of care: Skilled Nursing Facility Barriers to Discharge: Barriers Resolved   Patient Goals and CMS Choice   CMS Medicare.gov Compare Post Acute Care list provided to:: Patient Choice offered to / list presented to : Patient      Discharge Placement              Patient chooses bed at: Other - please specify in the comment section below: [facility] SNF) Patient to be transferred to facility by: [facility] Name of family member notified: Fawaz Borquez; Daughter; [phone] Patient and family notified of of transfer: 02/19/24  Discharge Plan and Services Additional resources added to the After Visit Summary for   In-house Referral: Clinical Social Work   Post Acute Care Choice: Skilled Nursing Facility                               Social Drivers of Health (SDOH) Interventions SDOH Screenings   Food Insecurity: No Food Insecurity (02/06/2024)  Housing: Low Risk  (02/06/2024)  Transportation Needs: No Transportation Needs (02/06/2024)   Utilities: Not At Risk (02/06/2024)  Social Connections: Moderately Isolated (02/06/2024)  Tobacco Use: Medium Risk (02/17/2024)     Readmission Risk Interventions    12/08/2023   10:22 AM 12/03/2023   10:44 AM 10/09/2023    3:26 PM  Readmission Risk Prevention Plan  Transportation Screening  Complete Complete  PCP or Specialist Appt within 5-7 Days   Complete  PCP or Specialist Appt within 3-5 Days Complete    Home Care Screening   Complete  Medication Review (RN CM)   Complete  HRI or Home Care Consult Complete Complete   Social Work Consult for Recovery Care Planning/Counseling  Complete   Palliative Care Screening Complete Not Applicable   Medication Review Oceanographer) Complete Complete

## 2024-02-19 NOTE — Progress Notes (Signed)
 Report called to Sterling Eisenmenger, LPN @ [phone]

## 2024-02-19 NOTE — Plan of Care (Signed)

## 2024-02-20 DIAGNOSIS — R2681 Unsteadiness on feet: Secondary | ICD-10-CM | POA: Insufficient documentation

## 2024-02-20 DIAGNOSIS — Z7409 Other reduced mobility: Secondary | ICD-10-CM | POA: Insufficient documentation

## 2024-02-20 DIAGNOSIS — M6281 Muscle weakness (generalized): Secondary | ICD-10-CM | POA: Insufficient documentation

## 2024-02-20 DIAGNOSIS — Z741 Need for assistance with personal care: Secondary | ICD-10-CM | POA: Insufficient documentation

## 2024-02-20 DIAGNOSIS — Z931 Gastrostomy status: Secondary | ICD-10-CM | POA: Insufficient documentation

## 2024-02-23 ENCOUNTER — Telehealth: Payer: Self-pay | Admitting: Oncology

## 2024-02-23 NOTE — Telephone Encounter (Signed)
 02/23/24 Spoke with Orelia Binet at [facility] and scheduled an appt.Patient in short term Rehab.

## 2024-02-23 NOTE — Telephone Encounter (Signed)
 error

## 2024-02-24 NOTE — Progress Notes (Incomplete)
 [facility]  [address] [place],  [place]  [address] [phone]  Clinic Day: 02/24/24   Referring physician: Teofilo Fellers, NP  ASSESSMENT & PLAN:  Assessment:   1. 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  He will continue to take dastinib 100 mg daily,  However, his recent chest CT showed bilateral pleural effusions, which are new.  As there is no history of congestive heart failure, the question is are these effusions related to his dasatinib .  He will have a chest x-ray in 3 months.  If his effusions are worse, then a change in CML therapy would need to be considered.  He denies being significantly short of breath at this time.  His CML transcript levels will be reassessed in 3 months   2. History of stage IA non-small cell lung cancer in 2016 treated with surgery alone.  His October, 2024 CT scans showed no evidence of disease recurrence.     3. History of stage IA cholangiocarcinoma in 2014 treated with surgery alone. His October, 2024 CT scans showed no evidence of disease recurrence.     4. B12 deficiency.  He was on B12 injections monthly with his primary care office. He missed his last one and so it has been over a month since his last one and yet his level was good at 667 so I told him he can stop the B-12 injections for now. His B12 level will be rechecked before his next visit.   5. Alcoholic Hepatitis. He knows he needs to quit drinking but is struggling to do so.   Plan: Patient was admitted into the hospital in January, 2025 and was found to have acute congestive heart failure. He was instructed to stop Lasix  and was placed on torsemide  20 mg and recommened follow-up with his cardiologist. He followed up with his cardiologist and received a good report from him. He had a echocardiogram in December, 2024 which was normal. I agreed with stopping Lasix  and will take that off of his med list. I instructed him to continue taking torsemide  20 mg at 3 pills  daily as instructed. He had labs done on 10/21/2023 and he had a WBC of 5.0, hemoglobin of 13.6, and platelet count of 256,000. He had a slightly low sodium of 134, a elevated AST of 78, and his potassium was low-normal at 3.5 improved from 3.4. The rest of his CMP was normal. I instructed him to stay on his oral potassium 2-3 daily and try to increase his fluids. PCR for BCR/ABL was negative. However, his CEA dropped from 7.9 to 3.33 but his CA 19.9 was elevated at 46 up from 36.  He notes not having a B-12 injection for over a month now. His B-12 level was 667 as of January, 2025 and was previously 370. I advised that he stop his B-12 injections and will recheck his levels in the future. I also provided a copy of his labs to bring to his next PCP appointment. I will see him back in 3 months with CBC, CMP, CEA, CA 19.9, and PCR for BCR/ABL done a week before his appointment with me in April. The patient understands the plans discussed today and is in agreement with them.  He knows to contact our office if he develops concerns prior to his next appointment.   I provided 20 minutes of face-to-face time during this encounter and > 50% was spent counseling as documented under my assessment and  plan.   Nolia Baumgartner, MD [name] - A DEPT OF [facility] [address] [place] [address] Dept: [phone] Dept Fax: [phone]   No orders of the defined types were placed in this encounter.   CHIEF COMPLAINT:  CC: Chronic myelogenous leukemia with history of lung cancer and cholangiocarcinoma  Current Treatment: Dasatinib  100 mg daily  HISTORY OF PRESENT ILLNESS:  The patient is a 73 year old man with CML which has been kept under ideal control with dasatinib .  He comes in today for routine follow-up, as well as to go his CT scans, which were done to ensure there is no radiographic evidence of disease recurrence, as it pertains to both  his early stage lung and biliary cancers.  Since his last visit, the patient has been doing well.  He denies having any new symptoms or findings which concern him for overt signs of disease recurrence.    Chronic myelogenous leukemia diagnosed in October 2015 when he presented with leukocytosis.  He was originally placed on nilotinib, but developed a severe pruritic rash, so was switched to dasatinib  100 mg daily.  He achieved a major molecular remission by May 2016.   He also has a history of a stage IA cholangiocarcinoma, treated with surgical resection in June 2014.   He then underwent surgical resection of a stage IA (T1 N0 M0) non-small cell lung cancer in February 2016.  Pathology revealed a 3 cm, well-differentiated, adenocarcinoma with negative nodes.  Adjuvant chemotherapy for his lung cancer was not recommended.  He has chronic back pain and continues seeing spine specialist.  He apparently has significant degenerative disc disease.  He has also had upper abdominal pain, which he attributes to dasatinib .  He uses hydrocodone /APAP as needed for his pain.  In May 2018, PCR was positive at 0.012%, consistent with residual CML.  Repeat PCR in August 2018 revealed a major molecular response.  His CML has remained in remission since that time.  CT chest, abdomen and pelvis in November 2019 did not reveal evidence of recurrence of either malignancy.     CT chest in August 2020 did not reveal any evidence of recurrent lung malignancy.  He has had hypocalcemia, so was instructed to take calcium 600 mg twice daily.  When he was seen in November 2020, had been having lower leg pain worsening throughout the day.  He underwent arterial ultrasound/ankle brachial indices, which was normal.  The pain was then felt to possibly be degenerative in nature.  He reported constipation due to the calcium and as his on calcium was normal at that time,  we decreased the calcium to daily.  He had worsening neutropenia and was  found to have B12 and placed on B12 injections weekly for 4 weeks, then monthly.  Serum folate was normal.  He had persistent abdominal pain, so was referred to Dr. Venice Gillis.  He was seen by Dr. Venice Gillis in 2021 and had 2 gastric ulcers and H. Pylori infection, which was treated.  He saw Dr. Reginal Capra earlier in 2021 as well, and PSA was normal. When he was seen in August, he had recurrent hypocalcemia, with a calcium of 8.3.  He could not tolerate the calcium supplement, so we recommended that he take Tums 4 times daily.  He continued to complain of intermittent abdominal pain, which we have attributed to adhesions.  He has had rotator cuff surgery in January 2022.  He has had  a colonoscopy by Dr. Venice Gillis with findings of a large polyp.  He will be going back on August 8th to have this resected through colonoscopy.  He missed his appointment in May but Dr. Venice Gillis told him "his labs were good".  I looked up and his PCR for BCR/ABL continues to be negative on the May reading.  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HCC)  02/15/2013 Initial Diagnosis   Carcinoma of biliary tract (HCC)   03/06/2013 Cancer Staging   Staging form: Distal Bile Ducts, AJCC 7th Edition - Clinical stage from 03/06/2013: Stage IA (T1, N0, M0) - Signed by Nolia Baumgartner, MD on 11/09/2020 Staged by: Managing physician Diagnostic confirmation: Positive histology Specimen type: Excision Histopathologic type: Cholangiocarcinoma Stage prefix: Initial diagnosis Lymph-vascular invasion (LVI): LVI not present (absent)/not identified Residual tumor (R): R0 - None Stage used in treatment planning: Yes National guidelines used in treatment planning: Yes Type of national guideline used in treatment planning: NCCN   Non-small cell carcinoma of lung (HCC)  11/08/2014 Cancer Staging   Staging form: Lung, AJCC 7th Edition - Clinical stage from 11/08/2014: Stage IA (T1b, N0, M0) - Signed by Nolia Baumgartner, MD on 11/09/2020 Staged by:  Managing physician Diagnostic confirmation: Positive histology Specimen type: Excision Histopathologic type: Adenocarcinoma, NOS Stage prefix: Initial diagnosis Laterality: Left Tumor size (mm): 30 Histologic grade (G): G1 Lymph-vascular invasion (LVI): LVI not present (absent)/not identified Residual tumor (R): R0 - None Pleural/elastic layer invasion: PL0 Prognostic indicators: Resection LUL Stage used in treatment planning: Yes National guidelines used in treatment planning: Yes Type of national guideline used in treatment planning: NCCN   01/31/2015 Initial Diagnosis   Non-small cell carcinoma of lung (HCC)      INTERVAL HISTORY:  Jawuan is here for routine follow up for chronic myelogenous leukemia with history of lung cancer and cholangiocarcinoma. Patient states that he feels *** and ***.        He denies fever, chills, night sweats, or other signs of infection. He denies cardiorespiratory and gastrointestinal issues. He  denies pain. His appetite is *** and His weight {Weight change:10426}.  Patient states that he feels well and has no complaints of pain. He endorses not smoking in the last 6 months but continues to drink some alcohol. Patient was admitted into the hospital in January, 2025 and was found to have acute congestive heart failure. He was instructed to stop Lasix  and was placed on torsemide  20 mg and recommened follow-up with his cardiologist. He followed up with his cardiologist and received a good report from him. He had a echocardiogram in December, 2024 which was normal. I agreed with stopping Lasix  and will take that off of his med list. I instructed him to continue taking torsemide  20 mg but 3 pills daily as instructed. He had labs done on 10/21/2023 and he had a WBC of 5.0, hemoglobin of 13.6, and platelet count of 256,000. He had a slightly low sodium of 134, a elevated AST of 78, and his potassium was low-normal at 3.5 improved from 3.4. The rest of his CMP  was normal. I instructed him to stay on his oral potassium 2-3 daily and try to increase his fluids. PCR for BCR/ABL was negative. However, his CEA dropped from 7.9 to 3.33 but his CA 19.9 was elevated at 46 up from 36.  He notes not having a B-12 injection for over a month now. His B-12 level was 667 as of January, 2025 and was previously 370.  I advised that he stop his B-12 injections and will recheck his levels in the future. I also provided a copy of his labs to bring to his next PCP appointment. I will see him back in 3 months with CBC, CMP, CEA, CA 19.9, and PCR for BCR/ABL done a week before his appointment with me in April.    REVIEW OF SYSTEMS:  Review of Systems  Constitutional: Negative.  Negative for appetite change, chills, diaphoresis, fatigue, fever and unexpected weight change.  HENT:  Negative.  Negative for hearing loss, lump/mass, mouth sores, nosebleeds, sore throat, tinnitus, trouble swallowing and voice change.   Eyes: Negative.  Negative for eye problems and icterus.  Respiratory: Negative.  Negative for chest tightness, cough, hemoptysis, shortness of breath and wheezing.   Cardiovascular: Negative.  Negative for chest pain, leg swelling and palpitations.  Gastrointestinal: Negative.  Negative for abdominal distention, abdominal pain, blood in stool, constipation, diarrhea, nausea, rectal pain and vomiting.  Endocrine: Negative.   Genitourinary: Negative.  Negative for bladder incontinence, difficulty urinating, dyspareunia, dysuria, frequency, hematuria, nocturia, pelvic pain and penile discharge.   Musculoskeletal: Negative.  Negative for arthralgias, back pain, flank pain, gait problem, myalgias, neck pain and neck stiffness.  Skin: Negative.  Negative for itching, rash and wound.  Neurological: Negative.  Negative for dizziness, extremity weakness, gait problem, headaches, light-headedness, numbness, seizures and speech difficulty.  Hematological: Negative.  Negative for  adenopathy. Does not bruise/bleed easily.  Psychiatric/Behavioral: Negative.  Negative for confusion, decreased concentration, depression, sleep disturbance and suicidal ideas. The patient is not nervous/anxious.   All other systems reviewed and are negative.    VITALS:  There were no vitals taken for this visit.  Wt Readings from Last 3 Encounters:  02/19/24 192 lb 7.4 oz (87.3 kg)  12/11/23 193 lb 12.6 oz (87.9 kg)  10/30/23 208 lb 3.2 oz (94.4 kg)    There is no height or weight on file to calculate BMI.  Performance status (ECOG): 0 - Asymptomatic  PHYSICAL EXAM:  Physical Exam Vitals and nursing note reviewed.  Constitutional:      General: He is not in acute distress.    Appearance: Normal appearance. He is normal weight. He is not ill-appearing, toxic-appearing or diaphoretic.  HENT:     Head: Normocephalic and atraumatic.     Right Ear: Tympanic membrane, ear canal and external ear normal. There is no impacted cerumen.     Left Ear: Tympanic membrane, ear canal and external ear normal. There is no impacted cerumen.     Nose: Nose normal. No congestion or rhinorrhea.     Mouth/Throat:     Mouth: Mucous membranes are moist.     Pharynx: Oropharynx is clear. No oropharyngeal exudate or posterior oropharyngeal erythema.  Eyes:     General: No scleral icterus.       Right eye: No discharge.        Left eye: No discharge.     Extraocular Movements: Extraocular movements intact.     Conjunctiva/sclera: Conjunctivae normal.     Pupils: Pupils are equal, round, and reactive to light.  Neck:     Vascular: No carotid bruit.  Cardiovascular:     Rate and Rhythm: Normal rate and regular rhythm.     Pulses: Normal pulses.     Heart sounds: Normal heart sounds. No murmur heard.    No friction rub. No gallop.  Pulmonary:     Effort: Pulmonary effort is normal. No respiratory distress.  Breath sounds: Normal breath sounds. No stridor. No wheezing, rhonchi or rales.  Chest:      Chest wall: No tenderness.  Abdominal:     General: Bowel sounds are normal. There is no distension.     Palpations: Abdomen is soft. There is no hepatomegaly, splenomegaly or mass.     Tenderness: There is abdominal tenderness in the right upper quadrant. There is no right CVA tenderness, left CVA tenderness, guarding or rebound.     Hernia: No hernia is present.     Comments: Tenderness in the right upper quadrant Mild hepatomegaly  Musculoskeletal:        General: No swelling, tenderness, deformity or signs of injury. Normal range of motion.     Cervical back: Normal range of motion and neck supple. No rigidity or tenderness.     Right lower leg: No edema.     Left lower leg: No edema.  Lymphadenopathy:     Cervical: No cervical adenopathy.  Skin:    General: Skin is warm and dry.     Coloration: Skin is not jaundiced or pale.     Findings: No bruising, erythema, lesion or rash.  Neurological:     General: No focal deficit present.     Mental Status: He is alert and oriented to person, place, and time. Mental status is at baseline.     Cranial Nerves: No cranial nerve deficit.     Sensory: No sensory deficit.     Motor: No weakness.     Coordination: Coordination normal.     Gait: Gait normal.     Deep Tendon Reflexes: Reflexes normal.  Psychiatric:        Mood and Affect: Mood normal.        Behavior: Behavior normal.        Thought Content: Thought content normal.        Judgment: Judgment normal.    LABS:      Latest Ref Rng & Units 02/18/2024    2:51 AM 02/17/2024    2:28 AM 02/16/2024    2:19 AM  CBC  WBC 4.0 - 10.5 K/uL 4.2  3.9  5.1   Hemoglobin 13.0 - 17.0 g/dL 7.7  7.0  7.1   Hematocrit 39.0 - 52.0 % 23.7  21.5  21.7   Platelets 150 - 400 K/uL 290  242  244       Latest Ref Rng & Units 02/19/2024    5:39 AM 02/18/2024    2:51 AM 02/17/2024    2:28 AM  CMP  Glucose 70 - 99 mg/dL 161  096  90   BUN 8 - 23 mg/dL 9  9  11    Creatinine 0.61 - 1.24 mg/dL 0.45   4.09  8.11   Sodium 135 - 145 mmol/L 132  133  135   Potassium 3.5 - 5.1 mmol/L 4.2  4.0  3.8   Chloride 98 - 111 mmol/L 104  106  105   CO2 22 - 32 mmol/L 24  23  23    Calcium 8.9 - 10.3 mg/dL 7.6  7.7  7.8    Lab Results  Component Value Date   CEA1 7.9 (H) 07/17/2023   CEA 1.11 01/20/2024   /  CEA  Date Value Ref Range Status  07/17/2023 7.9 (H) 0.0 - 4.7 ng/mL Final    Comment:    (NOTE)  Nonsmokers          <3.9                             Smokers             <5.6 Roche [employer] Electrochemiluminescence Immunoassay (ECLIA) Values obtained with different assay methods or kits cannot be used interchangeably.  Results cannot be interpreted as absolute evidence of the presence or absence of malignant disease. Performed At: [facility] [address] [place], [place] [id number] Pearlean Botts MD EX:[id number]    CEA ([facility])  Date Value Ref Range Status  01/20/2024 1.11 0.00 - 5.00 ng/mL Final    Comment:    (NOTE) This test was performed using [employer] Coul[employer]'s paramagnetic chemiluminescent immunoassay. Values obtained from different assay methods cannot be used interchangeably. Please note that up to 8% of patients who smoke may see values 5.1-10.0 ng/ml and 1% of patients who smoke may see CEA levels >10.0 ng/ml. Performed at [facility], [address], [place], [place] [address]    Lab Results  Component Value Date   [id number] 01/20/2024   STUDIES:  EXAM: 10/21/2023 CHEST - 2 VIEW IMPRESSION: Stable small right pleural effusion and mild right basilar atelectasis. No new or progressive disease.        HISTORY:   Allergies:  Allergies  Allergen Reactions   Pneumococcal Vaccines Swelling    Current Medications: Current Outpatient Medications  Medication Sig Dispense Refill   albuterol  (PROVENTIL  HFA;VENTOLIN  HFA) 108 (90 BASE) MCG/ACT inhaler Inhale 2 puffs into the lungs every 6 (six)  hours as needed for wheezing or shortness of breath.     allopurinol  (ZYLOPRIM ) 300 MG tablet Place 1 tablet (300 mg total) into feeding tube daily.     aspirin  EC 81 MG tablet Take 1 tablet (81 mg total) by mouth daily. Swallow whole.     Cholecalciferol (VITAMIN D ) 50 MCG (2000 UT) tablet Place 1 tablet (2,000 Units total) into feeding tube daily.     dasatinib  (SPRYCEL ) 100 MG tablet Place 1 tablet (100 mg total) into feeding tube daily.     diclofenac Sodium (VOLTAREN) 1 % GEL Apply 2 g topically at bedtime as needed (Pain).     famotidine  (PEPCID ) 40 MG tablet Place 1 tablet (40 mg total) into feeding tube daily.     fluticasone  (FLONASE ) 50 MCG/ACT nasal spray Place 2 sprays into both nostrils daily.     folic acid  (FOLVITE ) 1 MG tablet Place 1 tablet (1 mg total) into feeding tube daily.     HYDROcodone -acetaminophen  (NORCO/VICODIN) 5-325 MG tablet Place 1 tablet into feeding tube every 6 (six) hours as needed for moderate pain (pain score 4-6). 15 tablet 0   methocarbamol  (ROBAXIN ) 750 MG tablet Place 1 tablet (750 mg total) into feeding tube every 6 (six) hours as needed for muscle spasms.     midodrine  (PROAMATINE ) 5 MG tablet Place 2 tablets (10 mg total) into feeding tube 2 (two) times daily with a meal.     Multiple Vitamin (MULTIVITAMIN WITH MINERALS) TABS tablet Place 1 tablet into feeding tube daily.     Nutritional Supplements (FEEDING SUPPLEMENT, OSMOLITE 1.5 CAL,) LIQD Place 1,000 mLs into feeding tube continuous. At  55ml/hr     polyethylene glycol (MIRALAX  / GLYCOLAX ) 17 g packet Place 17 g into feeding tube daily as needed for moderate constipation.     Protein (FEEDING SUPPLEMENT, PROSOURCE TF20,) liquid  Place 60 mLs into feeding tube daily.     tamsulosin  (FLOMAX ) 0.4 MG CAPS capsule Take 2 capsules (0.8 mg total) by mouth daily.     torsemide  40 MG TABS Place 20 mg into feeding tube daily.     TRELEGY ELLIPTA 100-62.5-25 MCG/INH AEPB Inhale 1 puff into the lungs daily at 6  (six) AM.     Water  For Irrigation, Sterile (FREE WATER ) SOLN Place 200 mLs into feeding tube every 8 (eight) hours.     No current facility-administered medications for this visit.    I,Jasmine M Lassiter,acting as a scribe for Nolia Baumgartner, MD.,have documented all relevant documentation on the behalf of Nolia Baumgartner, MD,as directed by  Nolia Baumgartner, MD while in the presence of Nolia Baumgartner, MD.  I have reviewed this report as typed by the medical scribe, and it is complete and accurate.

## 2024-03-02 ENCOUNTER — Inpatient Hospital Stay

## 2024-03-02 ENCOUNTER — Inpatient Hospital Stay: Admitting: Oncology

## 2024-03-12 ENCOUNTER — Inpatient Hospital Stay

## 2024-03-12 ENCOUNTER — Inpatient Hospital Stay: Admitting: Oncology

## 2024-03-12 ENCOUNTER — Other Ambulatory Visit: Payer: Self-pay | Admitting: Oncology

## 2024-03-12 DIAGNOSIS — D500 Iron deficiency anemia secondary to blood loss (chronic): Secondary | ICD-10-CM | POA: Insufficient documentation

## 2024-03-12 DIAGNOSIS — C9210 Chronic myeloid leukemia, BCR/ABL-positive, not having achieved remission: Secondary | ICD-10-CM

## 2024-03-12 HISTORY — DX: Iron deficiency anemia secondary to blood loss (chronic): D50.0

## 2024-03-12 NOTE — Progress Notes (Deleted)
 [facility]  [address] [place],  [place]  [address] [phone]  Clinic Day:10/30/23  Referring physician: Teofilo Fellers, NP  ASSESSMENT & PLAN:  Assessment:   1. 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  He will continue to take dastinib 100 mg daily,  However, his recent chest CT showed bilateral pleural effusions, which are new.  As there is no history of congestive heart failure, the question is are these effusions related to his dasatinib .  He will have a chest x-ray in 3 months.  If his effusions are worse, then a change in CML therapy would need to be considered.  He denies being significantly short of breath at this time.  His CML transcript levels will be reassessed in 3 months   2. History of stage IA non-small cell lung cancer in 2016 treated with surgery alone.  His October, 2024 CT scans showed no evidence of disease recurrence.     3. History of stage IA cholangiocarcinoma in 2014 treated with surgery alone. His October, 2024 CT scans showed no evidence of disease recurrence.     4. B12 deficiency.  He was on B12 injections monthly with his primary care office. He missed his last one and so it has been over a month since his last one and yet his level was good at 667 so I told him he can stop the B-12 injections for now. His B12 level will be rechecked before his next visit.   5. Alcoholic Hepatitis. He knows he needs to quit drinking but is struggling to do so.   Plan: Patient was admitted into the hospital in January, 2025 and was found to have acute congestive heart failure. He was instructed to stop Lasix  and was placed on torsemide  20 mg and recommened follow-up with his cardiologist. He followed up with his cardiologist and received a good report from him. He had a echocardiogram in December, 2024 which was normal. I agreed with stopping Lasix  and will take that off of his med list. I instructed him to continue taking torsemide  20 mg at 3 pills  daily as instructed. He had labs done on 10/21/2023 and he had a WBC of 5.0, hemoglobin of 13.6, and platelet count of 256,000. He had a slightly low sodium of 134, a elevated AST of 78, and his potassium was low-normal at 3.5 improved from 3.4. The rest of his CMP was normal. I instructed him to stay on his oral potassium 2-3 daily and try to increase his fluids. PCR for BCR/ABL was negative. However, his CEA dropped from 7.9 to 3.33 but his CA 19.9 was elevated at 46 up from 36.  He notes not having a B-12 injection for over a month now. His B-12 level was 667 as of January, 2025 and was previously 370. I advised that he stop his B-12 injections and will recheck his levels in the future. I also provided a copy of his labs to bring to his next PCP appointment. I will see him back in 3 months with CBC, CMP, CEA, CA 19.9, and PCR for BCR/ABL done a week before his appointment with me in April. The patient understands the plans discussed today and is in agreement with them.  He knows to contact our office if he develops concerns prior to his next appointment.   I provided 20 minutes of face-to-face time during this encounter and > 50% was spent counseling as documented under my assessment and plan.  Nolia Baumgartner, MD [name] - A DEPT OF [facility] [address] [place] [address] Dept: [phone] Dept Fax: [phone]   No orders of the defined types were placed in this encounter.   CHIEF COMPLAINT:  CC: Chronic myelogenous leukemia with history of lung cancer and cholangiocarcinoma  Current Treatment: Dasatinib  100 mg daily  HISTORY OF PRESENT ILLNESS:  The patient is a 73 year old man with CML which has been kept under ideal control with dasatinib .  He comes in today for routine follow-up, as well as to go his CT scans, which were done to ensure there is no radiographic evidence of disease recurrence, as it pertains to both  his early stage lung and biliary cancers.  Since his last visit, the patient has been doing well.  He denies having any new symptoms or findings which concern him for overt signs of disease recurrence.    Chronic myelogenous leukemia diagnosed in October 2015 when he presented with leukocytosis.  He was originally placed on nilotinib, but developed a severe pruritic rash, so was switched to dasatinib  100 mg daily.  He achieved a major molecular remission by May 2016.   He also has a history of a stage IA cholangiocarcinoma, treated with surgical resection in June 2014.   He then underwent surgical resection of a stage IA (T1 N0 M0) non-small cell lung cancer in February 2016.  Pathology revealed a 3 cm, well-differentiated, adenocarcinoma with negative nodes.  Adjuvant chemotherapy for his lung cancer was not recommended.  He has chronic back pain and continues seeing spine specialist.  He apparently has significant degenerative disc disease.  He has also had upper abdominal pain, which he attributes to dasatinib .  He uses hydrocodone /APAP as needed for his pain.  In May 2018, PCR was positive at 0.012%, consistent with residual CML.  Repeat PCR in August 2018 revealed a major molecular response.  His CML has remained in remission since that time.  CT chest, abdomen and pelvis in November 2019 did not reveal evidence of recurrence of either malignancy.     CT chest in August 2020 did not reveal any evidence of recurrent lung malignancy.  He has had hypocalcemia, so was instructed to take calcium 600 mg twice daily.  When he was seen in November 2020, had been having lower leg pain worsening throughout the day.  He underwent arterial ultrasound/ankle brachial indices, which was normal.  The pain was then felt to possibly be degenerative in nature.  He reported constipation due to the calcium and as his on calcium was normal at that time,  we decreased the calcium to daily.  He had worsening neutropenia and was  found to have B12 and placed on B12 injections weekly for 4 weeks, then monthly.  Serum folate was normal.  He had persistent abdominal pain, so was referred to Dr. Venice Gillis.  He was seen by Dr. Venice Gillis in 2021 and had 2 gastric ulcers and H. Pylori infection, which was treated.  He saw Dr. Reginal Capra earlier in 2021 as well, and PSA was normal. When he was seen in August, he had recurrent hypocalcemia, with a calcium of 8.3.  He could not tolerate the calcium supplement, so we recommended that he take Tums 4 times daily.  He continued to complain of intermittent abdominal pain, which we have attributed to adhesions.  He has had rotator cuff surgery in January 2022.  He has had a colonoscopy by  Dr. Venice Gillis with findings of a large polyp.  He will be going back on August 8th to have this resected through colonoscopy.  He missed his appointment in May but Dr. Venice Gillis told him his labs were good.  I looked up and his PCR for BCR/ABL continues to be negative on the May reading.  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HCC)  02/15/2013 Initial Diagnosis   Carcinoma of biliary tract (HCC)   03/06/2013 Cancer Staging   Staging form: Distal Bile Ducts, AJCC 7th Edition - Clinical stage from 03/06/2013: Stage IA (T1, N0, M0) - Signed by Nolia Baumgartner, MD on 11/09/2020 Staged by: Managing physician Diagnostic confirmation: Positive histology Specimen type: Excision Histopathologic type: Cholangiocarcinoma Stage prefix: Initial diagnosis Lymph-vascular invasion (LVI): LVI not present (absent)/not identified Residual tumor (R): R0 - None Stage used in treatment planning: Yes National guidelines used in treatment planning: Yes Type of national guideline used in treatment planning: NCCN   Non-small cell carcinoma of lung (HCC)  11/08/2014 Cancer Staging   Staging form: Lung, AJCC 7th Edition - Clinical stage from 11/08/2014: Stage IA (T1b, N0, M0) - Signed by Nolia Baumgartner, MD on 11/09/2020 Staged by:  Managing physician Diagnostic confirmation: Positive histology Specimen type: Excision Histopathologic type: Adenocarcinoma, NOS Stage prefix: Initial diagnosis Laterality: Left Tumor size (mm): 30 Histologic grade (G): G1 Lymph-vascular invasion (LVI): LVI not present (absent)/not identified Residual tumor (R): R0 - None Pleural/elastic layer invasion: PL0 Prognostic indicators: Resection LUL Stage used in treatment planning: Yes National guidelines used in treatment planning: Yes Type of national guideline used in treatment planning: NCCN   01/31/2015 Initial Diagnosis   Non-small cell carcinoma of lung (HCC)      INTERVAL HISTORY:  Kyle Montoya is here for routine follow up for chronic myelogenous leukemia with history of lung cancer and cholangiocarcinoma. Patient states that he feels well and has no complaints of pain. He endorses not smoking in the last 6 months but continues to drink some alcohol. Patient was admitted into the hospital in January, 2025 and was found to have acute congestive heart failure. He was instructed to stop Lasix  and was placed on torsemide  20 mg and recommened follow-up with his cardiologist. He followed up with his cardiologist and received a good report from him. He had a echocardiogram in December, 2024 which was normal. I agreed with stopping Lasix  and will take that off of his med list. I instructed him to continue taking torsemide  20 mg but 3 pills daily as instructed. He had labs done on 10/21/2023 and he had a WBC of 5.0, hemoglobin of 13.6, and platelet count of 256,000. He had a slightly low sodium of 134, a elevated AST of 78, and his potassium was low-normal at 3.5 improved from 3.4. The rest of his CMP was normal. I instructed him to stay on his oral potassium 2-3 daily and try to increase his fluids. PCR for BCR/ABL was negative. However, his CEA dropped from 7.9 to 3.33 but his CA 19.9 was elevated at 46 up from 36.  He notes not having a B-12 injection  for over a month now. His B-12 level was 667 as of January, 2025 and was previously 370. I advised that he stop his B-12 injections and will recheck his levels in the future. I also provided a copy of his labs to bring to his next PCP appointment. I will see him back in 3 months with CBC, CMP, CEA, CA 19.9, and PCR for BCR/ABL  done a week before his appointment with me in April. He denies signs of infection such as sore throat, sinus drainage, cough, or urinary symptoms.  He denies fevers or recurrent chills. He denies pain. He denies nausea, vomiting, chest pain, dyspnea or cough. His appetite is good and his weight has increased 7 pounds over last 3 weeks.  REVIEW OF SYSTEMS:  Review of Systems  Constitutional: Negative.  Negative for appetite change, chills, diaphoresis, fatigue, fever and unexpected weight change.  HENT:  Negative.  Negative for hearing loss, lump/mass, mouth sores, nosebleeds, sore throat, tinnitus, trouble swallowing and voice change.   Eyes: Negative.  Negative for eye problems and icterus.  Respiratory: Negative.  Negative for chest tightness, cough, hemoptysis, shortness of breath and wheezing.   Cardiovascular: Negative.  Negative for chest pain, leg swelling and palpitations.  Gastrointestinal: Negative.  Negative for abdominal distention, abdominal pain, blood in stool, constipation, diarrhea, nausea, rectal pain and vomiting.  Endocrine: Negative.   Genitourinary: Negative.  Negative for bladder incontinence, difficulty urinating, dyspareunia, dysuria, frequency, hematuria, nocturia, pelvic pain and penile discharge.   Musculoskeletal: Negative.  Negative for arthralgias, back pain, flank pain, gait problem, myalgias, neck pain and neck stiffness.  Skin: Negative.  Negative for itching, rash and wound.  Neurological: Negative.  Negative for dizziness, extremity weakness, gait problem, headaches, light-headedness, numbness, seizures and speech difficulty.  Hematological:  Negative.  Negative for adenopathy. Does not bruise/bleed easily.  Psychiatric/Behavioral: Negative.  Negative for confusion, decreased concentration, depression, sleep disturbance and suicidal ideas. The patient is not nervous/anxious.   All other systems reviewed and are negative.    VITALS:  There were no vitals taken for this visit.  Wt Readings from Last 3 Encounters:  02/19/24 192 lb 7.4 oz (87.3 kg)  12/11/23 193 lb 12.6 oz (87.9 kg)  10/30/23 208 lb 3.2 oz (94.4 kg)    There is no height or weight on file to calculate BMI.  Performance status (ECOG): 0 - Asymptomatic  PHYSICAL EXAM:  Physical Exam Vitals and nursing note reviewed.  Constitutional:      General: He is not in acute distress.    Appearance: Normal appearance. He is normal weight. He is not ill-appearing, toxic-appearing or diaphoretic.  HENT:     Head: Normocephalic and atraumatic.     Right Ear: Tympanic membrane, ear canal and external ear normal. There is no impacted cerumen.     Left Ear: Tympanic membrane, ear canal and external ear normal. There is no impacted cerumen.     Nose: Nose normal. No congestion or rhinorrhea.     Mouth/Throat:     Mouth: Mucous membranes are moist.     Pharynx: Oropharynx is clear. No oropharyngeal exudate or posterior oropharyngeal erythema.   Eyes:     General: No scleral icterus.       Right eye: No discharge.        Left eye: No discharge.     Extraocular Movements: Extraocular movements intact.     Conjunctiva/sclera: Conjunctivae normal.     Pupils: Pupils are equal, round, and reactive to light.   Neck:     Vascular: No carotid bruit.   Cardiovascular:     Rate and Rhythm: Normal rate and regular rhythm.     Pulses: Normal pulses.     Heart sounds: Normal heart sounds. No murmur heard.    No friction rub. No gallop.  Pulmonary:     Effort: Pulmonary effort is normal. No respiratory distress.  Breath sounds: Normal breath sounds. No stridor. No wheezing,  rhonchi or rales.  Chest:     Chest wall: No tenderness.  Abdominal:     General: Bowel sounds are normal. There is no distension.     Palpations: Abdomen is soft. There is no hepatomegaly, splenomegaly or mass.     Tenderness: There is abdominal tenderness in the right upper quadrant. There is no right CVA tenderness, left CVA tenderness, guarding or rebound.     Hernia: No hernia is present.     Comments: Tenderness in the right upper quadrant Mild hepatomegaly   Musculoskeletal:        General: No swelling, tenderness, deformity or signs of injury. Normal range of motion.     Cervical back: Normal range of motion and neck supple. No rigidity or tenderness.     Right lower leg: No edema.     Left lower leg: No edema.  Lymphadenopathy:     Cervical: No cervical adenopathy.   Skin:    General: Skin is warm and dry.     Coloration: Skin is not jaundiced or pale.     Findings: No bruising, erythema, lesion or rash.   Neurological:     General: No focal deficit present.     Mental Status: He is alert and oriented to person, place, and time. Mental status is at baseline.     Cranial Nerves: No cranial nerve deficit.     Sensory: No sensory deficit.     Motor: No weakness.     Coordination: Coordination normal.     Gait: Gait normal.     Deep Tendon Reflexes: Reflexes normal.   Psychiatric:        Mood and Affect: Mood normal.        Behavior: Behavior normal.        Thought Content: Thought content normal.        Judgment: Judgment normal.    LABS:      Latest Ref Rng & Units 02/18/2024    2:51 AM 02/17/2024    2:28 AM 02/16/2024    2:19 AM  CBC  WBC 4.0 - 10.5 K/uL 4.2  3.9  5.1   Hemoglobin 13.0 - 17.0 g/dL 7.7  7.0  7.1   Hematocrit 39.0 - 52.0 % 23.7  21.5  21.7   Platelets 150 - 400 K/uL 290  242  244       Latest Ref Rng & Units 02/19/2024    5:39 AM 02/18/2024    2:51 AM 02/17/2024    2:28 AM  CMP  Glucose 70 - 99 mg/dL 188  416  90   BUN 8 - 23 mg/dL 9  9   11    Creatinine 0.61 - 1.24 mg/dL 6.06  3.01  6.01   Sodium 135 - 145 mmol/L 132  133  135   Potassium 3.5 - 5.1 mmol/L 4.2  4.0  3.8   Chloride 98 - 111 mmol/L 104  106  105   CO2 22 - 32 mmol/L 24  23  23    Calcium 8.9 - 10.3 mg/dL 7.6  7.7  7.8    Lab Results  Component Value Date   CEA1 7.9 (H) 07/17/2023   CEA 1.11 01/20/2024   /  CEA  Date Value Ref Range Status  07/17/2023 7.9 (H) 0.0 - 4.7 ng/mL Final    Comment:    (NOTE)  Nonsmokers          <3.9                             Smokers             <5.6 Roche [employer] Electrochemiluminescence Immunoassay (ECLIA) Values obtained with different assay methods or kits cannot be used interchangeably.  Results cannot be interpreted as absolute evidence of the presence or absence of malignant disease. Performed At: [facility] [address] [place], [place] [id number] Pearlean Botts MD WU:[id number]    CEA [facility])  Date Value Ref Range Status  01/20/2024 1.11 0.00 - 5.00 ng/mL Final    Comment:    (NOTE) This test was performed using [employer] Coulter's paramagnetic chemiluminescent immunoassay. Values obtained from different assay methods cannot be used interchangeably. Please note that up to 8% of patients who smoke may see values 5.1-10.0 ng/ml and 1% of patients who smoke may see CEA levels >10.0 ng/ml. Performed at [facility], [address] [place], [place] [address]    Lab Results  Component Value Date   [id number] 01/20/2024   STUDIES:  EXAM: 10/21/2023 CHEST - 2 VIEW IMPRESSION: Stable small right pleural effusion and mild right basilar atelectasis. No new or progressive disease.  EXAM: 07/17/2023 CT CHEST, ABDOMEN, AND PELVIS WITH CONTRAST  IMPRESSION: 1. Status post left upper lobectomy, without recurrent or metastatic disease. 2. Since 04/29/2023 abdominal CT, persistent or recurrent right lower lobe ground-glass with anterior right lower  lobe clustered nodularity. All favored to be infectious. 3. New bilateral pleural effusions and trace left abdominal ascites, suggesting fluid overload. 4. Status post Whipple procedure without acute superimposed process. 5. Incidental findings, including: Coronary artery atherosclerosis. Aortic Atherosclerosis (ICD10-I70.0). Possible constipation.   HISTORY:   Allergies:  Allergies  Allergen Reactions   Pneumococcal Vaccines Swelling    Current Medications: Current Outpatient Medications  Medication Sig Dispense Refill   albuterol  (PROVENTIL  HFA;VENTOLIN  HFA) 108 (90 BASE) MCG/ACT inhaler Inhale 2 puffs into the lungs every 6 (six) hours as needed for wheezing or shortness of breath.     allopurinol  (ZYLOPRIM ) 300 MG tablet Place 1 tablet (300 mg total) into feeding tube daily.     aspirin  EC 81 MG tablet Take 1 tablet (81 mg total) by mouth daily. Swallow whole.     Cholecalciferol (VITAMIN D ) 50 MCG (2000 UT) tablet Place 1 tablet (2,000 Units total) into feeding tube daily.     dasatinib  (SPRYCEL ) 100 MG tablet Place 1 tablet (100 mg total) into feeding tube daily.     diclofenac Sodium (VOLTAREN) 1 % GEL Apply 2 g topically at bedtime as needed (Pain).     famotidine  (PEPCID ) 40 MG tablet Place 1 tablet (40 mg total) into feeding tube daily.     fluticasone  (FLONASE ) 50 MCG/ACT nasal spray Place 2 sprays into both nostrils daily.     folic acid  (FOLVITE ) 1 MG tablet Place 1 tablet (1 mg total) into feeding tube daily.     HYDROcodone -acetaminophen  (NORCO/VICODIN) 5-325 MG tablet Place 1 tablet into feeding tube every 6 (six) hours as needed for moderate pain (pain score 4-6). 15 tablet 0   methocarbamol  (ROBAXIN ) 750 MG tablet Place 1 tablet (750 mg total) into feeding tube every 6 (six) hours as needed for muscle spasms.     midodrine  (PROAMATINE ) 5 MG tablet Place 2 tablets (10 mg total) into feeding tube 2 (two) times daily with  a meal.     Multiple Vitamin (MULTIVITAMIN WITH  MINERALS) TABS tablet Place 1 tablet into feeding tube daily.     Nutritional Supplements (FEEDING SUPPLEMENT, OSMOLITE 1.5 CAL,) LIQD Place 1,000 mLs into feeding tube continuous. At  55ml/hr     polyethylene glycol (MIRALAX  / GLYCOLAX ) 17 g packet Place 17 g into feeding tube daily as needed for moderate constipation.     Protein (FEEDING SUPPLEMENT, PROSOURCE TF20,) liquid Place 60 mLs into feeding tube daily.     tamsulosin  (FLOMAX ) 0.4 MG CAPS capsule Take 2 capsules (0.8 mg total) by mouth daily.     torsemide  40 MG TABS Place 20 mg into feeding tube daily.     TRELEGY ELLIPTA 100-62.5-25 MCG/INH AEPB Inhale 1 puff into the lungs daily at 6 (six) AM.     Water  For Irrigation, Sterile (FREE WATER ) SOLN Place 200 mLs into feeding tube every 8 (eight) hours.     No current facility-administered medications for this visit.    I,Jasmine M Lassiter,acting as a scribe for Nolia Baumgartner, MD.,have documented all relevant documentation on the behalf of Nolia Baumgartner, MD,as directed by  Nolia Baumgartner, MD while in the presence of Nolia Baumgartner, MD.  I have reviewed this report as typed by the medical scribe, and it is complete and accurate.

## 2024-03-17 ENCOUNTER — Inpatient Hospital Stay: Admitting: Oncology

## 2024-03-17 ENCOUNTER — Inpatient Hospital Stay: Attending: Hematology and Oncology

## 2024-03-17 NOTE — Progress Notes (Incomplete)
 [facility]  [address] [place],  [place]  [address] [phone]  Clinic Day: 03/17/24   Referring physician: Nestor Elston NOVAK, NP  ASSESSMENT & PLAN:  Assessment:   1. 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  He will continue to take dastinib 100 mg daily,  However, his recent chest CT showed bilateral pleural effusions, which are new.  As there is no history of congestive heart failure, the question is are these effusions related to his dasatinib .  He will have a chest x-ray in 3 months.  If his effusions are worse, then a change in CML therapy would need to be considered.  He denies being significantly short of breath at this time.  His CML transcript levels will be reassessed in 3 months   2. History of stage IA non-small cell lung cancer in 2016 treated with surgery alone.  His October, 2024 CT scans showed no evidence of disease recurrence.     3. History of stage IA cholangiocarcinoma in 2014 treated with surgery alone. His October, 2024 CT scans showed no evidence of disease recurrence.     4. B12 deficiency.  He was on B12 injections monthly with his primary care office. He missed his last one and so it has been over a month since his last one and yet his level was good at 667 so I told him he can stop the B-12 injections for now. His B12 level will be rechecked before his next visit.   5. Alcoholic Hepatitis. He knows he needs to quit drinking but is struggling to do so.   Plan: Patient was admitted into the hospital in January, 2025 and was found to have acute congestive heart failure. He was instructed to stop Lasix  and was placed on torsemide  20 mg and recommened follow-up with his cardiologist. He followed up with his cardiologist and received a good report from him. He had a echocardiogram in December, 2024 which was normal. I agreed with stopping Lasix  and will take that off of his med list. I instructed him to continue taking torsemide  20 mg at 3 pills  daily as instructed. He had labs done on 10/21/2023 and he had a WBC of 5.0, hemoglobin of 13.6, and platelet count of 256,000. He had a slightly low sodium of 134, a elevated AST of 78, and his potassium was low-normal at 3.5 improved from 3.4. The rest of his CMP was normal. I instructed him to stay on his oral potassium 2-3 daily and try to increase his fluids. PCR for BCR/ABL was negative. However, his CEA dropped from 7.9 to 3.33 but his CA 19.9 was elevated at 46 up from 36.  He notes not having a B-12 injection for over a month now. His B-12 level was 667 as of January, 2025 and was previously 370. I advised that he stop his B-12 injections and will recheck his levels in the future. I also provided a copy of his labs to bring to his next PCP appointment. I will see him back in 3 months with CBC, CMP, CEA, CA 19.9, and PCR for BCR/ABL done a week before his appointment with me in April. The patient understands the plans discussed today and is in agreement with them.  He knows to contact our office if he develops concerns prior to his next appointment.   I provided 20 minutes of face-to-face time during this encounter and > 50% was spent counseling as documented under my assessment and  plan.   [name], MD [facility] A DEPT OF [name] [address] [place] [address] Dept: [phone] Dept Fax: [phone]   No orders of the defined types were placed in this encounter.   CHIEF COMPLAINT:  CC: Chronic myelogenous leukemia with history of lung cancer and cholangiocarcinoma  Current Treatment: Dasatinib  100 mg daily  HISTORY OF PRESENT ILLNESS:  The patient is a 73 year old man with CML which has been kept under ideal control with dasatinib .  He comes in today for routine follow-up, as well as to go his CT scans, which were done to ensure there is no radiographic evidence of disease recurrence, as it pertains to both  his early stage lung and biliary cancers.  Since his last visit, the patient has been doing well.  He denies having any new symptoms or findings which concern him for overt signs of disease recurrence.    Chronic myelogenous leukemia diagnosed in October 2015 when he presented with leukocytosis.  He was originally placed on nilotinib, but developed a severe pruritic rash, so was switched to dasatinib  100 mg daily.  He achieved a major molecular remission by May 2016.   He also has a history of a stage IA cholangiocarcinoma, treated with surgical resection in June 2014.   He then underwent surgical resection of a stage IA (T1 N0 M0) non-small cell lung cancer in February 2016.  Pathology revealed a 3 cm, well-differentiated, adenocarcinoma with negative nodes.  Adjuvant chemotherapy for his lung cancer was not recommended.  He has chronic back pain and continues seeing spine specialist.  He apparently has significant degenerative disc disease.  He has also had upper abdominal pain, which he attributes to dasatinib .  He uses hydrocodone /APAP as needed for his pain.  In May 2018, PCR was positive at 0.012%, consistent with residual CML.  Repeat PCR in August 2018 revealed a major molecular response.  His CML has remained in remission since that time.  CT chest, abdomen and pelvis in November 2019 did not reveal evidence of recurrence of either malignancy.     CT chest in August 2020 did not reveal any evidence of recurrent lung malignancy.  He has had hypocalcemia, so was instructed to take calcium 600 mg twice daily.  When he was seen in November 2020, had been having lower leg pain worsening throughout the day.  He underwent arterial ultrasound/ankle brachial indices, which was normal.  The pain was then felt to possibly be degenerative in nature.  He reported constipation due to the calcium and as his on calcium was normal at that time,  we decreased the calcium to daily.  He had worsening neutropenia and was  found to have B12 and placed on B12 injections weekly for 4 weeks, then monthly.  Serum folate was normal.  He had persistent abdominal pain, so was referred to Dr. Charlanne.  He was seen by Dr. Charlanne in 2021 and had 2 gastric ulcers and H. Pylori infection, which was treated.  He saw Dr. Marda earlier in 2021 as well, and PSA was normal. When he was seen in August, he had recurrent hypocalcemia, with a calcium of 8.3.  He could not tolerate the calcium supplement, so we recommended that he take Tums 4 times daily.  He continued to complain of intermittent abdominal pain, which we have attributed to adhesions.  He has had rotator cuff surgery in January 2022.  He has had  a colonoscopy by Dr. Charlanne with findings of a large polyp.  He will be going back on August 8th to have this resected through colonoscopy.  He missed his appointment in May but Dr. Charlanne told him his labs were good.  I looked up and his PCR for BCR/ABL continues to be negative on the May reading.  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HCC)  02/15/2013 Initial Diagnosis   Carcinoma of biliary tract (HCC)   03/06/2013 Cancer Staging   Staging form: Distal Bile Ducts, AJCC 7th Edition - Clinical stage from 03/06/2013: Stage IA (T1, N0, M0) - Signed by Cornelius Wanda DEL, MD on 11/09/2020 Staged by: Managing physician Diagnostic confirmation: Positive histology Specimen type: Excision Histopathologic type: Cholangiocarcinoma Stage prefix: Initial diagnosis Lymph-vascular invasion (LVI): LVI not present (absent)/not identified Residual tumor (R): R0 - None Stage used in treatment planning: Yes National guidelines used in treatment planning: Yes Type of national guideline used in treatment planning: NCCN   Non-small cell carcinoma of lung (HCC)  11/08/2014 Cancer Staging   Staging form: Lung, AJCC 7th Edition - Clinical stage from 11/08/2014: Stage IA (T1b, N0, M0) - Signed by Cornelius Wanda DEL, MD on 11/09/2020 Staged by:  Managing physician Diagnostic confirmation: Positive histology Specimen type: Excision Histopathologic type: Adenocarcinoma, NOS Stage prefix: Initial diagnosis Laterality: Left Tumor size (mm): 30 Histologic grade (G): G1 Lymph-vascular invasion (LVI): LVI not present (absent)/not identified Residual tumor (R): R0 - None Pleural/elastic layer invasion: PL0 Prognostic indicators: Resection LUL Stage used in treatment planning: Yes National guidelines used in treatment planning: Yes Type of national guideline used in treatment planning: NCCN   01/31/2015 Initial Diagnosis   Non-small cell carcinoma of lung (HCC)      INTERVAL HISTORY:  Kash is here for routine follow up for chronic myelogenous leukemia with history of lung cancer and cholangiocarcinoma. He has been in and out of the hospital over the last few months for ****. Patient states that he feels *** and ***.        He denies fever, chills, night sweats, or other signs of infection. He denies cardiorespiratory and gastrointestinal issues. He  denies pain. His appetite is *** and His weight {Weight change:10426}.  Patient states that he feels well and has no complaints of pain. He endorses not smoking in the last 6 months but continues to drink some alcohol. Patient was admitted into the hospital in January, 2025 and was found to have acute congestive heart failure. He was instructed to stop Lasix  and was placed on torsemide  20 mg and recommened follow-up with his cardiologist. He followed up with his cardiologist and received a good report from him. He had a echocardiogram in December, 2024 which was normal. I agreed with stopping Lasix  and will take that off of his med list. I instructed him to continue taking torsemide  20 mg but 3 pills daily as instructed. He had labs done on 10/21/2023 and he had a WBC of 5.0, hemoglobin of 13.6, and platelet count of 256,000. He had a slightly low sodium of 134, a elevated AST of 78, and  his potassium was low-normal at 3.5 improved from 3.4. The rest of his CMP was normal. I instructed him to stay on his oral potassium 2-3 daily and try to increase his fluids. PCR for BCR/ABL was negative. However, his CEA dropped from 7.9 to 3.33 but his CA 19.9 was elevated at 46 up from 36.  He notes not having a B-12 injection for over  a month now. His B-12 level was 667 as of January, 2025 and was previously 370. I advised that he stop his B-12 injections and will recheck his levels in the future. I also provided a copy of his labs to bring to his next PCP appointment. I will see him back in 3 months with CBC, CMP, CEA, CA 19.9, and PCR for BCR/ABL done a week before his appointment with me in April. He denies signs of infection such as sore throat, sinus drainage, cough, or urinary symptoms.  He denies fevers or recurrent chills. He denies pain. He denies nausea, vomiting, chest pain, dyspnea or cough. His appetite is good and his weight has increased 7 pounds over last 3 weeks.  REVIEW OF SYSTEMS:  Review of Systems  Constitutional: Negative.  Negative for appetite change, chills, diaphoresis, fatigue, fever and unexpected weight change.  HENT:  Negative.  Negative for hearing loss, lump/mass, mouth sores, nosebleeds, sore throat, tinnitus, trouble swallowing and voice change.   Eyes: Negative.  Negative for eye problems and icterus.  Respiratory: Negative.  Negative for chest tightness, cough, hemoptysis, shortness of breath and wheezing.   Cardiovascular: Negative.  Negative for chest pain, leg swelling and palpitations.  Gastrointestinal: Negative.  Negative for abdominal distention, abdominal pain, blood in stool, constipation, diarrhea, nausea, rectal pain and vomiting.  Endocrine: Negative.   Genitourinary: Negative.  Negative for bladder incontinence, difficulty urinating, dyspareunia, dysuria, frequency, hematuria, nocturia, pelvic pain and penile discharge.   Musculoskeletal: Negative.   Negative for arthralgias, back pain, flank pain, gait problem, myalgias, neck pain and neck stiffness.  Skin: Negative.  Negative for itching, rash and wound.  Neurological: Negative.  Negative for dizziness, extremity weakness, gait problem, headaches, light-headedness, numbness, seizures and speech difficulty.  Hematological: Negative.  Negative for adenopathy. Does not bruise/bleed easily.  Psychiatric/Behavioral: Negative.  Negative for confusion, decreased concentration, depression, sleep disturbance and suicidal ideas. The patient is not nervous/anxious.   All other systems reviewed and are negative.    VITALS:  There were no vitals taken for this visit.  Wt Readings from Last 3 Encounters:  02/19/24 192 lb 7.4 oz (87.3 kg)  12/11/23 193 lb 12.6 oz (87.9 kg)  10/30/23 208 lb 3.2 oz (94.4 kg)    There is no height or weight on file to calculate BMI.  Performance status (ECOG): 0 - Asymptomatic  PHYSICAL EXAM:  Physical Exam Vitals and nursing note reviewed.  Constitutional:      General: He is not in acute distress.    Appearance: Normal appearance. He is normal weight. He is not ill-appearing, toxic-appearing or diaphoretic.  HENT:     Head: Normocephalic and atraumatic.     Right Ear: Tympanic membrane, ear canal and external ear normal. There is no impacted cerumen.     Left Ear: Tympanic membrane, ear canal and external ear normal. There is no impacted cerumen.     Nose: Nose normal. No congestion or rhinorrhea.     Mouth/Throat:     Mouth: Mucous membranes are moist.     Pharynx: Oropharynx is clear. No oropharyngeal exudate or posterior oropharyngeal erythema.   Eyes:     General: No scleral icterus.       Right eye: No discharge.        Left eye: No discharge.     Extraocular Movements: Extraocular movements intact.     Conjunctiva/sclera: Conjunctivae normal.     Pupils: Pupils are equal, round, and reactive to light.   Neck:  Vascular: No carotid bruit.    Cardiovascular:     Rate and Rhythm: Normal rate and regular rhythm.     Pulses: Normal pulses.     Heart sounds: Normal heart sounds. No murmur heard.    No friction rub. No gallop.  Pulmonary:     Effort: Pulmonary effort is normal. No respiratory distress.     Breath sounds: Normal breath sounds. No stridor. No wheezing, rhonchi or rales.  Chest:     Chest wall: No tenderness.  Abdominal:     General: Bowel sounds are normal. There is no distension.     Palpations: Abdomen is soft. There is no hepatomegaly, splenomegaly or mass.     Tenderness: There is abdominal tenderness in the right upper quadrant. There is no right CVA tenderness, left CVA tenderness, guarding or rebound.     Hernia: No hernia is present.     Comments: Tenderness in the right upper quadrant Mild hepatomegaly   Musculoskeletal:        General: No swelling, tenderness, deformity or signs of injury. Normal range of motion.     Cervical back: Normal range of motion and neck supple. No rigidity or tenderness.     Right lower leg: No edema.     Left lower leg: No edema.  Lymphadenopathy:     Cervical: No cervical adenopathy.   Skin:    General: Skin is warm and dry.     Coloration: Skin is not jaundiced or pale.     Findings: No bruising, erythema, lesion or rash.   Neurological:     General: No focal deficit present.     Mental Status: He is alert and oriented to person, place, and time. Mental status is at baseline.     Cranial Nerves: No cranial nerve deficit.     Sensory: No sensory deficit.     Motor: No weakness.     Coordination: Coordination normal.     Gait: Gait normal.     Deep Tendon Reflexes: Reflexes normal.   Psychiatric:        Mood and Affect: Mood normal.        Behavior: Behavior normal.        Thought Content: Thought content normal.        Judgment: Judgment normal.    LABS:      Latest Ref Rng & Units 02/18/2024    2:51 AM 02/17/2024    2:28 AM 02/16/2024    2:19 AM  CBC   WBC 4.0 - 10.5 K/uL 4.2  3.9  5.1   Hemoglobin 13.0 - 17.0 g/dL 7.7  7.0  7.1   Hematocrit 39.0 - 52.0 % 23.7  21.5  21.7   Platelets 150 - 400 K/uL 290  242  244       Latest Ref Rng & Units 02/19/2024    5:39 AM 02/18/2024    2:51 AM 02/17/2024    2:28 AM  CMP  Glucose 70 - 99 mg/dL 880  883  90   BUN 8 - 23 mg/dL 9  9  11    Creatinine 0.61 - 1.24 mg/dL 9.42  9.39  9.47   Sodium 135 - 145 mmol/L 132  133  135   Potassium 3.5 - 5.1 mmol/L 4.2  4.0  3.8   Chloride 98 - 111 mmol/L 104  106  105   CO2 22 - 32 mmol/L 24  23  23    Calcium 8.9 - 10.3 mg/dL 7.6  7.7  7.8    Lab Results  Component Value Date   CEA1 7.9 (H) 07/17/2023   CEA 1.11 01/20/2024   /  CEA  Date Value Ref Range Status  07/17/2023 7.9 (H) 0.0 - 4.7 ng/mL Final    Comment:    (NOTE)                             Nonsmokers          <3.9                             Smokers             <5.6 [employer] Electrochemiluminescence Immunoassay (ECLIA) Values obtained with different assay methods or kits cannot be used interchangeably.  Results cannot be interpreted as absolute evidence of the presence or absence of malignant disease. Performed At: [facility] [address] [place], [place] [id number] Jennette Shorter MD Ey:[id number]    CEA ([facility])  Date Value Ref Range Status  01/20/2024 1.11 0.00 - 5.00 ng/mL Final    Comment:    (NOTE) This test was performed using [employer] Coul[employer]'s paramagnetic chemiluminescent immunoassay. Values obtained from different assay methods cannot be used interchangeably. Please note that up to 8% of patients who smoke may see values 5.1-10.0 ng/ml and 1% of patients who smoke may see CEA levels >10.0 ng/ml. Performed at [facility], [address], [place], [place] [address]    Lab Results  Component Value Date   [id number] 01/20/2024   STUDIES:  EXAM: 02/16/2024 CT ABDOMEN WITHOUT CONTRAS IMPRESSION: 1. Large bilateral pleural  effusions. 2. Small pericardial effusion. 3. No acute abdominal pathology identified. 4. Aortic atherosclerosis (ICD10-I70.0).   EXAM: 10/21/2023 CHEST - 2 VIEW IMPRESSION: Stable small right pleural effusion and mild right basilar atelectasis. No new or progressive disease.   HISTORY:   Allergies:  Allergies  Allergen Reactions   Pneumococcal Vaccines Swelling    Current Medications: Current Outpatient Medications  Medication Sig Dispense Refill   albuterol  (PROVENTIL  HFA;VENTOLIN  HFA) 108 (90 BASE) MCG/ACT inhaler Inhale 2 puffs into the lungs every 6 (six) hours as needed for wheezing or shortness of breath.     allopurinol  (ZYLOPRIM ) 300 MG tablet Place 1 tablet (300 mg total) into feeding tube daily.     aspirin  EC 81 MG tablet Take 1 tablet (81 mg total) by mouth daily. Swallow whole.     Cholecalciferol (VITAMIN D ) 50 MCG (2000 UT) tablet Place 1 tablet (2,000 Units total) into feeding tube daily.     dasatinib  (SPRYCEL ) 100 MG tablet Place 1 tablet (100 mg total) into feeding tube daily.     diclofenac Sodium (VOLTAREN) 1 % GEL Apply 2 g topically at bedtime as needed (Pain).     famotidine  (PEPCID ) 40 MG tablet Place 1 tablet (40 mg total) into feeding tube daily.     fluticasone  (FLONASE ) 50 MCG/ACT nasal spray Place 2 sprays into both nostrils daily.     folic acid  (FOLVITE ) 1 MG tablet Place 1 tablet (1 mg total) into feeding tube daily.     HYDROcodone -acetaminophen  (NORCO/VICODIN) 5-325 MG tablet Place 1 tablet into feeding tube every 6 (six) hours as needed for moderate pain (pain score 4-6). 15 tablet 0   methocarbamol  (ROBAXIN ) 750 MG tablet Place 1 tablet (750 mg total) into feeding tube every 6 (six) hours as needed  for muscle spasms.     midodrine  (PROAMATINE ) 5 MG tablet Place 2 tablets (10 mg total) into feeding tube 2 (two) times daily with a meal.     Multiple Vitamin (MULTIVITAMIN WITH MINERALS) TABS tablet Place 1 tablet into feeding tube daily.      Nutritional Supplements (FEEDING SUPPLEMENT, OSMOLITE 1.5 CAL,) LIQD Place 1,000 mLs into feeding tube continuous. At  55ml/hr     polyethylene glycol (MIRALAX  / GLYCOLAX ) 17 g packet Place 17 g into feeding tube daily as needed for moderate constipation.     Protein (FEEDING SUPPLEMENT, PROSOURCE TF20,) liquid Place 60 mLs into feeding tube daily.     tamsulosin  (FLOMAX ) 0.4 MG CAPS capsule Take 2 capsules (0.8 mg total) by mouth daily.     torsemide  40 MG TABS Place 20 mg into feeding tube daily.     TRELEGY ELLIPTA 100-62.5-25 MCG/INH AEPB Inhale 1 puff into the lungs daily at 6 (six) AM.     Water  For Irrigation, Sterile (FREE WATER ) SOLN Place 200 mLs into feeding tube every 8 (eight) hours.     No current facility-administered medications for this visit.    I,Jasmine M Lassiter,acting as a scribe for [name], MD.,have documented all relevant documentation on the behalf of [name], MD,as directed by  [name], MD while in the presence of [name], MD.  I have reviewed this report as typed by the medical scribe, and it is complete and accurate.

## 2024-03-24 ENCOUNTER — Inpatient Hospital Stay: Admitting: Oncology

## 2024-03-24 ENCOUNTER — Inpatient Hospital Stay

## 2024-03-24 DIAGNOSIS — I361 Nonrheumatic tricuspid (valve) insufficiency: Secondary | ICD-10-CM | POA: Diagnosis not present

## 2024-03-24 DIAGNOSIS — I351 Nonrheumatic aortic (valve) insufficiency: Secondary | ICD-10-CM | POA: Diagnosis not present

## 2024-03-24 DIAGNOSIS — I371 Nonrheumatic pulmonary valve insufficiency: Secondary | ICD-10-CM | POA: Diagnosis not present

## 2024-03-24 NOTE — Progress Notes (Incomplete)
 Kyle Montoya  [address] [place],  [place]  [address] [phone]  Clinic Day: 03/24/24   Referring physician: Nestor Elston NOVAK, NP  ASSESSMENT & PLAN:  Assessment:   1. 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  He will continue to take dastinib 100 mg daily,  However, his recent chest CT showed bilateral pleural effusions, which are new.  As there is no history of congestive heart failure, the question is are these effusions related to his dasatinib .  He will have a chest x-ray in 3 months.  If his effusions are worse, then a change in CML therapy would need to be considered.  He denies being significantly short of breath at this time.  His CML transcript levels will be reassessed in 3 months   2. History of stage IA non-small cell lung cancer in 2016 treated with surgery alone.  His October, 2024 CT scans showed no evidence of disease recurrence.     3. History of stage IA cholangiocarcinoma in 2014 treated with surgery alone. His October, 2024 CT scans showed no evidence of disease recurrence.     4. B12 deficiency.  He was on B12 injections monthly with his primary care office. He missed his last one and so it has been over a month since his last one and yet his level was good at 667 so I told him he can stop the B-12 injections for now. His B12 level will be rechecked before his next visit.   5. Alcoholic Hepatitis. He knows he needs to quit drinking but is struggling to do so.   Plan: Patient was admitted into the hospital in January, 2025 and was found to have acute congestive heart failure. He was instructed to stop Lasix  and was placed on torsemide  20 mg and recommened follow-up with his cardiologist. He followed up with his cardiologist and received a good report from him. He had a echocardiogram in December, 2024 which was normal. I agreed with stopping Lasix  and will take that off of his med list. I instructed him to continue taking torsemide  20 mg at 3 pills  daily as instructed. He had labs done on 10/21/2023 and he had a WBC of 5.0, hemoglobin of 13.6, and platelet count of 256,000. He had a slightly low sodium of 134, a elevated AST of 78, and his potassium was low-normal at 3.5 improved from 3.4. The rest of his CMP was normal. I instructed him to stay on his oral potassium 2-3 daily and try to increase his fluids. PCR for BCR/ABL was negative. However, his CEA dropped from 7.9 to 3.33 but his CA 19.9 was elevated at 46 up from 36.  He notes not having a B-12 injection for over a month now. His B-12 level was 667 as of January, 2025 and was previously 370. I advised that he stop his B-12 injections and will recheck his levels in the future. I also provided a copy of his labs to bring to his next PCP appointment. I will see him back in 3 months with CBC, CMP, CEA, CA 19.9, and PCR for BCR/ABL done a week before his appointment with me in April. The patient understands the plans discussed today and is in agreement with them.  He knows to contact our office if he develops concerns prior to his next appointment.   I provided 20 minutes of face-to-face time during this encounter and > 50% was spent counseling as documented under my assessment and  plan.   Kyle VEAR Cornish, Kyle Montoya [facility] - A DEPT OF [name] [address] [place] [address] Dept: [phone] Dept Fax: [phone]   No orders of the defined types were placed in this encounter.   CHIEF COMPLAINT:  CC: Chronic myelogenous leukemia with history of lung cancer and cholangiocarcinoma  Current Treatment: Dasatinib  100 mg daily  HISTORY OF PRESENT ILLNESS:  The patient is a 73 year old man with CML which has been kept under ideal control with dasatinib .  He comes in today for routine follow-up, as well as to go his CT scans, which were done to ensure there is no radiographic evidence of disease recurrence, as it pertains to both  his early stage lung and biliary cancers.  Since his last visit, the patient has been doing well.  He denies having any new symptoms or findings which concern him for overt signs of disease recurrence.    Chronic myelogenous leukemia diagnosed in October 2015 when he presented with leukocytosis.  He was originally placed on nilotinib, but developed a severe pruritic rash, so was switched to dasatinib  100 mg daily.  He achieved a major molecular remission by May 2016.   He also has a history of a stage IA cholangiocarcinoma, treated with surgical resection in June 2014.   He then underwent surgical resection of a stage IA (T1 N0 M0) non-small cell lung cancer in February 2016.  Pathology revealed a 3 cm, well-differentiated, adenocarcinoma with negative nodes.  Adjuvant chemotherapy for his lung cancer was not recommended.  He has chronic back pain and continues seeing spine specialist.  He apparently has significant degenerative disc disease.  He has also had upper abdominal pain, which he attributes to dasatinib .  He uses hydrocodone /APAP as needed for his pain.  In May 2018, PCR was positive at 0.012%, consistent with residual CML.  Repeat PCR in August 2018 revealed a major molecular response.  His CML has remained in remission since that time.  CT chest, abdomen and pelvis in November 2019 did not reveal evidence of recurrence of either malignancy.     CT chest in August 2020 did not reveal any evidence of recurrent lung malignancy.  He has had hypocalcemia, so was instructed to take calcium 600 mg twice daily.  When he was seen in November 2020, had been having lower leg pain worsening throughout the day.  He underwent arterial ultrasound/ankle brachial indices, which was normal.  The pain was then felt to possibly be degenerative in nature.  He reported constipation due to the calcium and as his on calcium was normal at that time,  we decreased the calcium to daily.  He had worsening neutropenia and was  found to have B12 and placed on B12 injections weekly for 4 weeks, then monthly.  Serum folate was normal.  He had persistent abdominal pain, so was referred to Dr. Charlanne.  He was seen by Dr. Charlanne in 2021 and had 2 gastric ulcers and H. Pylori infection, which was treated.  He saw Dr. Marda earlier in 2021 as well, and PSA was normal. When he was seen in August, he had recurrent hypocalcemia, with a calcium of 8.3.  He could not tolerate the calcium supplement, so we recommended that he take Tums 4 times daily.  He continued to complain of intermittent abdominal pain, which we have attributed to adhesions.  He has had rotator cuff surgery in January 2022.  He has had  a colonoscopy by Dr. Charlanne with findings of a large polyp.  He will be going back on August 8th to have this resected through colonoscopy.  He missed his appointment in May but Dr. Charlanne told him his labs were good.  I looked up and his PCR for BCR/ABL continues to be negative on the May reading.  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HCC)  02/15/2013 Initial Diagnosis   Carcinoma of biliary tract (HCC)   03/06/2013 Cancer Staging   Staging form: Distal Bile Ducts, AJCC 7th Edition - Clinical stage from 03/06/2013: Stage IA (T1, N0, M0) - Signed by Cornelius Kyle DEL, Kyle Montoya on 11/09/2020 Staged by: Managing physician Diagnostic confirmation: Positive histology Specimen type: Excision Histopathologic type: Cholangiocarcinoma Stage prefix: Initial diagnosis Lymph-vascular invasion (LVI): LVI not present (absent)/not identified Residual tumor (R): R0 - None Stage used in treatment planning: Yes National guidelines used in treatment planning: Yes Type of national guideline used in treatment planning: NCCN   Non-small cell carcinoma of lung (HCC)  11/08/2014 Cancer Staging   Staging form: Lung, AJCC 7th Edition - Clinical stage from 11/08/2014: Stage IA (T1b, N0, M0) - Signed by Cornelius Kyle DEL, Kyle Montoya on 11/09/2020 Staged by:  Managing physician Diagnostic confirmation: Positive histology Specimen type: Excision Histopathologic type: Adenocarcinoma, NOS Stage prefix: Initial diagnosis Laterality: Left Tumor size (mm): 30 Histologic grade (G): G1 Lymph-vascular invasion (LVI): LVI not present (absent)/not identified Residual tumor (R): R0 - None Pleural/elastic layer invasion: PL0 Prognostic indicators: Resection LUL Stage used in treatment planning: Yes National guidelines used in treatment planning: Yes Type of national guideline used in treatment planning: NCCN   01/31/2015 Initial Diagnosis   Non-small cell carcinoma of lung (HCC)      INTERVAL HISTORY:  Kyle Montoya is here for routine follow up for chronic myelogenous leukemia with history of lung cancer and cholangiocarcinoma. Patient states that he feels *** and ***.       He denies fever, chills, night sweats, or other signs of infection. He denies cardiorespiratory and gastrointestinal issues. He  denies pain. His appetite is *** and His weight {Weight change:10426}.  Patient states that he feels well and has no complaints of pain. He endorses not smoking in the last 6 months but continues to drink some alcohol. Patient was admitted into the hospital in January, 2025 and was found to have acute congestive heart failure. He was instructed to stop Lasix  and was placed on torsemide  20 mg and recommened follow-up with his cardiologist. He followed up with his cardiologist and received a good report from him. He had a echocardiogram in December, 2024 which was normal. I agreed with stopping Lasix  and will take that off of his med list. I instructed him to continue taking torsemide  20 mg but 3 pills daily as instructed. He had labs done on 10/21/2023 and he had a WBC of 5.0, hemoglobin of 13.6, and platelet count of 256,000. He had a slightly low sodium of 134, a elevated AST of 78, and his potassium was low-normal at 3.5 improved from 3.4. The rest of his CMP  was normal. I instructed him to stay on his oral potassium 2-3 daily and try to increase his fluids. PCR for BCR/ABL was negative. However, his CEA dropped from 7.9 to 3.33 but his CA 19.9 was elevated at 46 up from 36.  He notes not having a B-12 injection for over a month now. His B-12 level was 667 as of January, 2025 and was previously 370. I  advised that he stop his B-12 injections and will recheck his levels in the future. I also provided a copy of his labs to bring to his next PCP appointment. I will see him back in 3 months with CBC, CMP, CEA, CA 19.9, and PCR for BCR/ABL done a week before his appointment with me in April. He denies signs of infection such as sore throat, sinus drainage, cough, or urinary symptoms.  He denies fevers or recurrent chills. He denies pain. He denies nausea, vomiting, chest pain, dyspnea or cough. His appetite is good and his weight has increased 7 pounds over last 3 weeks.  REVIEW OF SYSTEMS:  Review of Systems  Constitutional: Negative.  Negative for appetite change, chills, diaphoresis, fatigue, fever and unexpected weight change.  HENT:  Negative.  Negative for hearing loss, lump/mass, mouth sores, nosebleeds, sore throat, tinnitus, trouble swallowing and voice change.   Eyes: Negative.  Negative for eye problems and icterus.  Respiratory: Negative.  Negative for chest tightness, cough, hemoptysis, shortness of breath and wheezing.   Cardiovascular: Negative.  Negative for chest pain, leg swelling and palpitations.  Gastrointestinal: Negative.  Negative for abdominal distention, abdominal pain, blood in stool, constipation, diarrhea, nausea, rectal pain and vomiting.  Endocrine: Negative.   Genitourinary: Negative.  Negative for bladder incontinence, difficulty urinating, dyspareunia, dysuria, frequency, hematuria, nocturia, pelvic pain and penile discharge.   Musculoskeletal: Negative.  Negative for arthralgias, back pain, flank pain, gait problem, myalgias,  neck pain and neck stiffness.  Skin: Negative.  Negative for itching, rash and wound.  Neurological: Negative.  Negative for dizziness, extremity weakness, gait problem, headaches, light-headedness, numbness, seizures and speech difficulty.  Hematological: Negative.  Negative for adenopathy. Does not bruise/bleed easily.  Psychiatric/Behavioral: Negative.  Negative for confusion, decreased concentration, depression, sleep disturbance and suicidal ideas. The patient is not nervous/anxious.   All other systems reviewed and are negative.    VITALS:  There were no vitals taken for this visit.  Wt Readings from Last 3 Encounters:  02/19/24 192 lb 7.4 oz (87.3 kg)  12/11/23 193 lb 12.6 oz (87.9 kg)  10/30/23 208 lb 3.2 oz (94.4 kg)    There is no height or weight on file to calculate BMI.  Performance status (ECOG): 0 - Asymptomatic  PHYSICAL EXAM:  Physical Exam Vitals and nursing note reviewed.  Constitutional:      General: He is not in acute distress.    Appearance: Normal appearance. He is normal weight. He is not ill-appearing, toxic-appearing or diaphoretic.  HENT:     Head: Normocephalic and atraumatic.     Right Ear: Tympanic membrane, ear canal and external ear normal. There is no impacted cerumen.     Left Ear: Tympanic membrane, ear canal and external ear normal. There is no impacted cerumen.     Nose: Nose normal. No congestion or rhinorrhea.     Mouth/Throat:     Mouth: Mucous membranes are moist.     Pharynx: Oropharynx is clear. No oropharyngeal exudate or posterior oropharyngeal erythema.  Eyes:     General: No scleral icterus.       Right eye: No discharge.        Left eye: No discharge.     Extraocular Movements: Extraocular movements intact.     Conjunctiva/sclera: Conjunctivae normal.     Pupils: Pupils are equal, round, and reactive to light.  Neck:     Vascular: No carotid bruit.  Cardiovascular:     Rate and Rhythm: Normal rate  and regular rhythm.      Pulses: Normal pulses.     Heart sounds: Normal heart sounds. No murmur heard.    No friction rub. No gallop.  Pulmonary:     Effort: Pulmonary effort is normal. No respiratory distress.     Breath sounds: Normal breath sounds. No stridor. No wheezing, rhonchi or rales.  Chest:     Chest wall: No tenderness.  Abdominal:     General: Bowel sounds are normal. There is no distension.     Palpations: Abdomen is soft. There is no hepatomegaly, splenomegaly or mass.     Tenderness: There is abdominal tenderness in the right upper quadrant. There is no right CVA tenderness, left CVA tenderness, guarding or rebound.     Hernia: No hernia is present.     Comments: Tenderness in the right upper quadrant Mild hepatomegaly  Musculoskeletal:        General: No swelling, tenderness, deformity or signs of injury. Normal range of motion.     Cervical back: Normal range of motion and neck supple. No rigidity or tenderness.     Right lower leg: No edema.     Left lower leg: No edema.  Lymphadenopathy:     Cervical: No cervical adenopathy.  Skin:    General: Skin is warm and dry.     Coloration: Skin is not jaundiced or pale.     Findings: No bruising, erythema, lesion or rash.  Neurological:     General: No focal deficit present.     Mental Status: He is alert and oriented to person, place, and time. Mental status is at baseline.     Cranial Nerves: No cranial nerve deficit.     Sensory: No sensory deficit.     Motor: No weakness.     Coordination: Coordination normal.     Gait: Gait normal.     Deep Tendon Reflexes: Reflexes normal.  Psychiatric:        Mood and Affect: Mood normal.        Behavior: Behavior normal.        Thought Content: Thought content normal.        Judgment: Judgment normal.    LABS:      Latest Ref Rng & Units 02/18/2024    2:51 AM 02/17/2024    2:28 AM 02/16/2024    2:19 AM  CBC  WBC 4.0 - 10.5 K/uL 4.2  3.9  5.1   Hemoglobin 13.0 - 17.0 g/dL 7.7  7.0  7.1    Hematocrit 39.0 - 52.0 % 23.7  21.5  21.7   Platelets 150 - 400 K/uL 290  242  244       Latest Ref Rng & Units 02/19/2024    5:39 AM 02/18/2024    2:51 AM 02/17/2024    2:28 AM  CMP  Glucose 70 - 99 mg/dL 880  883  90   BUN 8 - 23 mg/dL 9  9  11    Creatinine 0.61 - 1.24 mg/dL 9.42  9.39  9.47   Sodium 135 - 145 mmol/L 132  133  135   Potassium 3.5 - 5.1 mmol/L 4.2  4.0  3.8   Chloride 98 - 111 mmol/L 104  106  105   CO2 22 - 32 mmol/L 24  23  23    Calcium 8.9 - 10.3 mg/dL 7.6  7.7  7.8    Lab Results  Component Value Date   CEA1 7.9 (H) 07/17/2023  CEA 1.11 01/20/2024   /  CEA  Date Value Ref Range Status  07/17/2023 7.9 (H) 0.0 - 4.7 ng/mL Final    Comment:    (NOTE)                             Nonsmokers          <3.9                             Smokers             <5.6 Roche [employer] Electrochemiluminescence Immunoassay (ECLIA) Values obtained with different assay methods or kits cannot be used interchangeably.  Results cannot be interpreted as absolute evidence of the presence or absence of malignant disease. Performed At: [facility] [address] [place], [place] [id number] Jennette Shorter Kyle Montoya Ey:[phone]    CEA ([facility])  Date Value Ref Range Status  01/20/2024 1.11 0.00 - 5.00 ng/mL Final    Comment:    (NOTE) This test was performed using [employer] Coulter's paramagnetic chemiluminescent immunoassay. Values obtained from different assay methods cannot be used interchangeably. Please note that up to 8% of patients who smoke may see values 5.1-10.0 ng/ml and 1% of patients who smoke may see CEA levels >10.0 ng/ml. Performed at [facility], [address] [place], [place] [address]    Lab Results  Component Value Date   [id number] 01/20/2024   STUDIES:  EXAM: 10/21/2023 CHEST - 2 VIEW IMPRESSION: Stable small right pleural effusion and mild right basilar atelectasis. No new or progressive disease.  EXAM:  07/17/2023 CT CHEST, ABDOMEN, AND PELVIS WITH CONTRAST  IMPRESSION: 1. Status post left upper lobectomy, without recurrent or metastatic disease. 2. Since 04/29/2023 abdominal CT, persistent or recurrent right lower lobe ground-glass with anterior right lower lobe clustered nodularity. All favored to be infectious. 3. New bilateral pleural effusions and trace left abdominal ascites, suggesting fluid overload. 4. Status post Whipple procedure without acute superimposed process. 5. Incidental findings, including: Coronary artery atherosclerosis. Aortic Atherosclerosis (ICD10-I70.0). Possible constipation.   HISTORY:   Allergies:  Allergies  Allergen Reactions   Pneumococcal Vaccines Swelling    Current Medications: Current Outpatient Medications  Medication Sig Dispense Refill   albuterol  (PROVENTIL  HFA;VENTOLIN  HFA) 108 (90 BASE) MCG/ACT inhaler Inhale 2 puffs into the lungs every 6 (six) hours as needed for wheezing or shortness of breath.     allopurinol  (ZYLOPRIM ) 300 MG tablet Place 1 tablet (300 mg total) into feeding tube daily.     aspirin  EC 81 MG tablet Take 1 tablet (81 mg total) by mouth daily. Swallow whole.     Cholecalciferol (VITAMIN D ) 50 MCG (2000 UT) tablet Place 1 tablet (2,000 Units total) into feeding tube daily.     dasatinib  (SPRYCEL ) 100 MG tablet Place 1 tablet (100 mg total) into feeding tube daily.     diclofenac Sodium (VOLTAREN) 1 % GEL Apply 2 g topically at bedtime as needed (Pain).     famotidine  (PEPCID ) 40 MG tablet Place 1 tablet (40 mg total) into feeding tube daily.     fluticasone  (FLONASE ) 50 MCG/ACT nasal spray Place 2 sprays into both nostrils daily.     folic acid  (FOLVITE ) 1 MG tablet Place 1 tablet (1 mg total) into feeding tube daily.     HYDROcodone -acetaminophen  (NORCO/VICODIN) 5-325 MG tablet Place 1 tablet into feeding tube every 6 (six) hours  as needed for moderate pain (pain score 4-6). 15 tablet 0   methocarbamol  (ROBAXIN ) 750 MG  tablet Place 1 tablet (750 mg total) into feeding tube every 6 (six) hours as needed for muscle spasms.     midodrine  (PROAMATINE ) 5 MG tablet Place 2 tablets (10 mg total) into feeding tube 2 (two) times daily with a meal.     Multiple Vitamin (MULTIVITAMIN WITH MINERALS) TABS tablet Place 1 tablet into feeding tube daily.     Nutritional Supplements (FEEDING SUPPLEMENT, OSMOLITE 1.5 CAL,) LIQD Place 1,000 mLs into feeding tube continuous. At  55ml/hr     polyethylene glycol (MIRALAX  / GLYCOLAX ) 17 g packet Place 17 g into feeding tube daily as needed for moderate constipation.     Protein (FEEDING SUPPLEMENT, PROSOURCE TF20,) liquid Place 60 mLs into feeding tube daily.     tamsulosin  (FLOMAX ) 0.4 MG CAPS capsule Take 2 capsules (0.8 mg total) by mouth daily.     torsemide  40 MG TABS Place 20 mg into feeding tube daily.     TRELEGY ELLIPTA 100-62.5-25 MCG/INH AEPB Inhale 1 puff into the lungs daily at 6 (six) AM.     Water  For Irrigation, Sterile (FREE WATER ) SOLN Place 200 mLs into feeding tube every 8 (eight) hours.     No current facility-administered medications for this visit.    I,Kyle Montoya,acting as a scribe for Kyle VEAR Cornish, Kyle Montoya.,have documented all relevant documentation on the behalf of Kyle VEAR Cornish, Kyle Montoya,as directed by  Kyle VEAR Cornish, Kyle Montoya while in the presence of Kyle VEAR Cornish, Kyle Montoya.  I have reviewed this report as typed by the medical scribe, and it is complete and accurate.

## 2024-04-02 ENCOUNTER — Ambulatory Visit: Attending: Cardiology | Admitting: Cardiology

## 2024-04-07 ENCOUNTER — Encounter: Payer: Self-pay | Admitting: Internal Medicine

## 2024-04-07 ENCOUNTER — Telehealth: Payer: Self-pay | Admitting: Gastroenterology

## 2024-04-07 NOTE — Telephone Encounter (Signed)
 Patient daughter calling back advised her to call interventional radiology at [phone] in regards to patient feeding tube.

## 2024-04-07 NOTE — Telephone Encounter (Signed)
 Inbound call from patient stating he had a feeding tube placed. States it is time to get it removed but he is unsure where to go and would like  a referral. Please advise, thank you

## 2024-04-07 NOTE — Telephone Encounter (Signed)
 Noted, thanks!

## 2024-04-22 ENCOUNTER — Encounter (HOSPITAL_BASED_OUTPATIENT_CLINIC_OR_DEPARTMENT_OTHER): Payer: Self-pay | Admitting: Nurse Practitioner

## 2024-04-23 ENCOUNTER — Other Ambulatory Visit (HOSPITAL_COMMUNITY): Payer: Self-pay | Admitting: *Deleted

## 2024-04-23 DIAGNOSIS — R1310 Dysphagia, unspecified: Secondary | ICD-10-CM

## 2024-05-04 ENCOUNTER — Encounter: Payer: Self-pay | Admitting: Physician Assistant

## 2024-05-10 ENCOUNTER — Ambulatory Visit (HOSPITAL_COMMUNITY)
Admission: RE | Admit: 2024-05-10 | Discharge: 2024-05-10 | Disposition: A | Discharge status: Home / Self Care | Source: Ambulatory Visit | Attending: Student | Admitting: Student

## 2024-05-10 ENCOUNTER — Ambulatory Visit (HOSPITAL_COMMUNITY)

## 2024-05-10 ENCOUNTER — Encounter (HOSPITAL_COMMUNITY): Payer: Self-pay

## 2024-05-11 NOTE — Progress Notes (Incomplete)
 [facility]  [address] [place],  [place]  [address] [phone]  Clinic Day: 05/11/24   Referring physician: Nestor Elston NOVAK, NP  ASSESSMENT & PLAN:  Assessment:   1. 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  He will continue to take dastinib 100 mg daily,  However, his recent chest CT showed bilateral pleural effusions, which are new.  As there is no history of congestive heart failure, the question is are these effusions related to his dasatinib .  He will have a chest x-ray in 3 months.  If his effusions are worse, then a change in CML therapy would need to be considered.  He denies being significantly short of breath at this time.  His CML transcript levels will be reassessed in 3 months   2. History of stage IA non-small cell lung cancer in 2016 treated with surgery alone.  His October, 2024 CT scans showed no evidence of disease recurrence.     3. History of stage IA cholangiocarcinoma in 2014 treated with surgery alone. His October, 2024 CT scans showed no evidence of disease recurrence.     4. B12 deficiency.  He was on B12 injections monthly with his primary care office. He missed his last one and so it has been over a month since his last one and yet his level was good at 667 so I told him he can stop the B-12 injections for now. His B12 level will be rechecked before his next visit.   5. Alcoholic Hepatitis. He knows he needs to quit drinking but is struggling to do so.   Plan: Patient was admitted into the [facility] in January, 2025 and was found to have acute congestive heart failure. He was instructed to stop Lasix  and was placed on torsemide  20 mg and recommened follow-up with his cardiologist. He followed up with his cardiologist and received a good report from him. He had a echocardiogram in December, 2024 which was normal. I agreed with stopping Lasix  and will take that off of his med list. I instructed him to continue taking torsemide  20 mg at 3 pills  daily as instructed. He had labs done on 10/21/2023 and he had a WBC of 5.0, hemoglobin of 13.6, and platelet count of 256,000. He had a slightly low sodium of 134, a elevated AST of 78, and his potassium was low-normal at 3.5 improved from 3.4. The rest of his CMP was normal. I instructed him to stay on his oral potassium 2-3 daily and try to increase his fluids. PCR for BCR/ABL was negative. However, his CEA dropped from 7.9 to 3.33 but his CA 19.9 was elevated at 46 up from 36.  He notes not having a B-12 injection for over a month now. His B-12 level was 667 as of January, 2025 and was previously 370. I advised that he stop his B-12 injections and will recheck his levels in the future. I also provided a copy of his labs to bring to his next PCP appointment. I will see him back in 3 months with CBC, CMP, CEA, CA 19.9, and PCR for BCR/ABL done a week before his appointment with me in April. The patient understands the plans discussed today and is in agreement with them.  He knows to contact our office if he develops concerns prior to his next appointment.   I provided 20 minutes of face-to-face time during this encounter and > 50% was spent counseling as documented under my assessment and  plan.   Kyle VEAR Cornish, MD [facility] [address] [place] [address] Dept: [phone] Dept Fax: [phone]   No orders of the defined types were placed in this encounter.   CHIEF COMPLAINT:  CC: Chronic myelogenous leukemia with history of lung cancer and cholangiocarcinoma  Current Treatment: Dasatinib  100 mg daily  HISTORY OF PRESENT ILLNESS:  The patient is a 73 year old man with CML which has been kept under ideal control with dasatinib .  He comes in today for routine follow-up, as well as to go his CT scans, which were done to ensure there is no radiographic evidence of disease recurrence, as it pertains to both  his early stage lung and biliary cancers.  Since his last visit, the patient has been doing well.  He denies having any new symptoms or findings which concern him for overt signs of disease recurrence.    Chronic myelogenous leukemia diagnosed in October 2015 when he presented with leukocytosis.  He was originally placed on nilotinib, but developed a severe pruritic rash, so was switched to dasatinib  100 mg daily.  He achieved a major molecular remission by May 2016.   He also has a history of a stage IA cholangiocarcinoma, treated with surgical resection in June 2014.   He then underwent surgical resection of a stage IA (T1 N0 M0) non-small cell lung cancer in February 2016.  Pathology revealed a 3 cm, well-differentiated, adenocarcinoma with negative nodes.  Adjuvant chemotherapy for his lung cancer was not recommended.  He has chronic back pain and continues seeing spine specialist.  He apparently has significant degenerative disc disease.  He has also had upper abdominal pain, which he attributes to dasatinib .  He uses hydrocodone /APAP as needed for his pain.  In May 2018, PCR was positive at 0.012%, consistent with residual CML.  Repeat PCR in August 2018 revealed a major molecular response.  His CML has remained in remission since that time.  CT chest, abdomen and pelvis in November 2019 did not reveal evidence of recurrence of either malignancy.     CT chest in August 2020 did not reveal any evidence of recurrent lung malignancy.  He has had hypocalcemia, so was instructed to take calcium 600 mg twice daily.  When he was seen in November 2020, had been having lower leg pain worsening throughout the day.  He underwent arterial ultrasound/ankle brachial indices, which was normal.  The pain was then felt to possibly be degenerative in nature.  He reported constipation due to the calcium and as his on calcium was normal at that time,  we decreased the calcium to daily.  He had worsening neutropenia and was  found to have B12 and placed on B12 injections weekly for 4 weeks, then monthly.  Serum folate was normal.  He had persistent abdominal pain, so was referred to Dr. Charlanne.  He was seen by Dr. Charlanne in 2021 and had 2 gastric ulcers and H. Pylori infection, which was treated.  He saw Dr. Marda earlier in 2021 as well, and PSA was normal. When he was seen in August, he had recurrent hypocalcemia, with a calcium of 8.3.  He could not tolerate the calcium supplement, so we recommended that he take Tums 4 times daily.  He continued to complain of intermittent abdominal pain, which we have attributed to adhesions.  He has had rotator cuff surgery in January 2022.  He has had  a colonoscopy by Dr. Charlanne with findings of a large polyp.  He will be going back on August 8th to have this resected through colonoscopy.  He missed his appointment in May but Dr. Charlanne told him his labs were good.  I looked up and his PCR for BCR/ABL continues to be negative on the May reading.  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HCC)  02/15/2013 Initial Diagnosis   Carcinoma of biliary tract (HCC)   03/06/2013 Cancer Staging   Staging form: Distal Bile Ducts, AJCC 7th Edition - Clinical stage from 03/06/2013: Stage IA (T1, N0, M0) - Signed by Cornelius [facility] DEL, MD on 11/09/2020 Staged by: Managing physician Diagnostic confirmation: Positive histology Specimen type: Excision Histopathologic type: Cholangiocarcinoma Stage prefix: Initial diagnosis Lymph-vascular invasion (LVI): LVI not present (absent)/not identified Residual tumor (R): R0 - None Stage used in treatment planning: Yes National guidelines used in treatment planning: Yes Type of national guideline used in treatment planning: NCCN   Non-small cell carcinoma of lung (HCC)  11/08/2014 Cancer Staging   Staging form: Lung, AJCC 7th Edition - Clinical stage from 11/08/2014: Stage IA (T1b, N0, M0) - Signed by Cornelius Kyle DEL, MD on 11/09/2020 Staged by:  Managing physician Diagnostic confirmation: Positive histology Specimen type: Excision Histopathologic type: Adenocarcinoma, NOS Stage prefix: Initial diagnosis Laterality: Left Tumor size (mm): 30 Histologic grade (G): G1 Lymph-vascular invasion (LVI): LVI not present (absent)/not identified Residual tumor (R): R0 - None Pleural/elastic layer invasion: PL0 Prognostic indicators: Resection LUL Stage used in treatment planning: Yes National guidelines used in treatment planning: Yes Type of national guideline used in treatment planning: NCCN   01/31/2015 Initial Diagnosis   Non-small cell carcinoma of lung (HCC)      INTERVAL HISTORY:  [facility] is here for routine follow up for chronic myelogenous leukemia with history of lung cancer and cholangiocarcinoma. Patient states that he feels *** and ***.       He denies fever, chills, night sweats, or other signs of infection. He denies cardiorespiratory and gastrointestinal issues. He  denies pain. His appetite is *** and His weight {Weight change:10426}.   Patient states that he feels well and has no complaints of pain. He endorses not smoking in the last 6 months but continues to drink some alcohol. Patient was admitted into the [facility] in January, 2025 and was found to have acute congestive heart failure. He was instructed to stop Lasix  and was placed on torsemide  20 mg and recommened follow-up with his cardiologist. He followed up with his cardiologist and received a good report from him. He had a echocardiogram in December, 2024 which was normal. I agreed with stopping Lasix  and will take that off of his med list. I instructed him to continue taking torsemide  20 mg but 3 pills daily as instructed. He had labs done on 10/21/2023 and he had a WBC of 5.0, hemoglobin of 13.6, and platelet count of 256,000. He had a slightly low sodium of 134, a elevated AST of 78, and his potassium was low-normal at 3.5 improved from 3.4. The rest of his CMP  was normal. I instructed him to stay on his oral potassium 2-3 daily and try to increase his fluids. PCR for BCR/ABL was negative. However, his CEA dropped from 7.9 to 3.33 but his CA 19.9 was elevated at 46 up from 36.  He notes not having a B-12 injection for over a month now. His B-12 level was 667 as of January, 2025 and was previously 370.  I advised that he stop his B-12 injections and will recheck his levels in the future. I also provided a copy of his labs to bring to his next PCP appointment. I will see him back in 3 months with CBC, CMP, CEA, CA 19.9, and PCR for BCR/ABL done a week before his appointment with me in April. He denies signs of infection such as sore throat, sinus drainage, cough, or urinary symptoms.  He denies fevers or recurrent chills. He denies pain. He denies nausea, vomiting, chest pain, dyspnea or cough. His appetite is good and his weight has increased 7 pounds over last 3 weeks.  REVIEW OF SYSTEMS:  Review of Systems  Constitutional: Negative.  Negative for appetite change, chills, diaphoresis, fatigue, fever and unexpected weight change.  HENT:  Negative.  Negative for hearing loss, lump/mass, mouth sores, nosebleeds, sore throat, tinnitus, trouble swallowing and voice change.   Eyes: Negative.  Negative for eye problems and icterus.  Respiratory: Negative.  Negative for chest tightness, cough, hemoptysis, shortness of breath and wheezing.   Cardiovascular: Negative.  Negative for chest pain, leg swelling and palpitations.  Gastrointestinal: Negative.  Negative for abdominal distention, abdominal pain, blood in stool, constipation, diarrhea, nausea, rectal pain and vomiting.  Endocrine: Negative.   Genitourinary: Negative.  Negative for bladder incontinence, difficulty urinating, dyspareunia, dysuria, frequency, hematuria, nocturia, pelvic pain and penile discharge.   Musculoskeletal: Negative.  Negative for arthralgias, back pain, flank pain, gait problem, myalgias,  neck pain and neck stiffness.  Skin: Negative.  Negative for itching, rash and wound.  Neurological: Negative.  Negative for dizziness, extremity weakness, gait problem, headaches, light-headedness, numbness, seizures and speech difficulty.  Hematological: Negative.  Negative for adenopathy. Does not bruise/bleed easily.  Psychiatric/Behavioral: Negative.  Negative for confusion, decreased concentration, depression, sleep disturbance and suicidal ideas. The patient is not nervous/anxious.   All other systems reviewed and are negative.    VITALS:  There were no vitals taken for this visit.  Wt Readings from Last 3 Encounters:  02/19/24 192 lb 7.4 oz (87.3 kg)  12/11/23 193 lb 12.6 oz (87.9 kg)  10/30/23 208 lb 3.2 oz (94.4 kg)    There is no height or weight on file to calculate BMI.  Performance status (ECOG): 0 - Asymptomatic  PHYSICAL EXAM:  Physical Exam Vitals and nursing note reviewed.  Constitutional:      General: He is not in acute distress.    Appearance: Normal appearance. He is normal weight. He is not ill-appearing, toxic-appearing or diaphoretic.  HENT:     Head: Normocephalic and atraumatic.     Right Ear: Tympanic membrane, ear canal and external ear normal. There is no impacted cerumen.     Left Ear: Tympanic membrane, ear canal and external ear normal. There is no impacted cerumen.     Nose: Nose normal. No congestion or rhinorrhea.     Mouth/Throat:     Mouth: Mucous membranes are moist.     Pharynx: Oropharynx is clear. No oropharyngeal exudate or posterior oropharyngeal erythema.  Eyes:     General: No scleral icterus.       Right eye: No discharge.        Left eye: No discharge.     Extraocular Movements: Extraocular movements intact.     Conjunctiva/sclera: Conjunctivae normal.     Pupils: Pupils are equal, round, and reactive to light.  Neck:     Vascular: No carotid bruit.  Cardiovascular:     Rate and Rhythm: Normal  rate and regular rhythm.      Pulses: Normal pulses.     Heart sounds: Normal heart sounds. No murmur heard.    No friction rub. No gallop.  Pulmonary:     Effort: Pulmonary effort is normal. No respiratory distress.     Breath sounds: Normal breath sounds. No stridor. No wheezing, rhonchi or rales.  Chest:     Chest wall: No tenderness.  Abdominal:     General: Bowel sounds are normal. There is no distension.     Palpations: Abdomen is soft. There is no hepatomegaly, splenomegaly or mass.     Tenderness: There is abdominal tenderness in the right upper quadrant. There is no right CVA tenderness, left CVA tenderness, guarding or rebound.     Hernia: No hernia is present.     Comments: Tenderness in the right upper quadrant Mild hepatomegaly  Musculoskeletal:        General: No swelling, tenderness, deformity or signs of injury. Normal range of motion.     Cervical back: Normal range of motion and neck supple. No rigidity or tenderness.     Right lower leg: No edema.     Left lower leg: No edema.  Lymphadenopathy:     Cervical: No cervical adenopathy.  Skin:    General: Skin is warm and dry.     Coloration: Skin is not jaundiced or pale.     Findings: No bruising, erythema, lesion or rash.  Neurological:     General: No focal deficit present.     Mental Status: He is alert and oriented to person, place, and time. Mental status is at baseline.     Cranial Nerves: No cranial nerve deficit.     Sensory: No sensory deficit.     Motor: No weakness.     Coordination: Coordination normal.     Gait: Gait normal.     Deep Tendon Reflexes: Reflexes normal.  Psychiatric:        Mood and Affect: Mood normal.        Behavior: Behavior normal.        Thought Content: Thought content normal.        Judgment: Judgment normal.    LABS:      Latest Ref Rng & Units 02/18/2024    2:51 AM 02/17/2024    2:28 AM 02/16/2024    2:19 AM  CBC  WBC 4.0 - 10.5 K/uL 4.2  3.9  5.1   Hemoglobin 13.0 - 17.0 g/dL 7.7  7.0  7.1    Hematocrit 39.0 - 52.0 % 23.7  21.5  21.7   Platelets 150 - 400 K/uL 290  242  244       Latest Ref Rng & Units 02/19/2024    5:39 AM 02/18/2024    2:51 AM 02/17/2024    2:28 AM  CMP  Glucose 70 - 99 mg/dL 880  883  90   BUN 8 - 23 mg/dL 9  9  11    Creatinine 0.61 - 1.24 mg/dL 9.42  9.39  9.47   Sodium 135 - 145 mmol/L 132  133  135   Potassium 3.5 - 5.1 mmol/L 4.2  4.0  3.8   Chloride 98 - 111 mmol/L 104  106  105   CO2 22 - 32 mmol/L 24  23  23    Calcium 8.9 - 10.3 mg/dL 7.6  7.7  7.8    Lab Results  Component Value Date   CEA1 7.9 (H) 07/17/2023  CEA 1.11 01/20/2024   /  CEA  Date Value Ref Range Status  07/17/2023 7.9 (H) 0.0 - 4.7 ng/mL Final    Comment:    (NOTE)                             Nonsmokers          <3.9                             Smokers             <5.6 [employer] Electrochemiluminescence Immunoassay (ECLIA) Values obtained with different assay methods or kits cannot be used interchangeably.  Results cannot be interpreted as absolute evidence of the presence or absence of malignant disease. Performed At: [facility] [address] [place], [place] [id number] Jennette Shorter MD Ey:[phone]    CEA ([facility])  Date Value Ref Range Status  01/20/2024 1.11 0.00 - 5.00 ng/mL Final    Comment:    (NOTE) This test was performed using [employer] Coul[employer]'s paramagnetic chemiluminescent immunoassay. Values obtained from different assay methods cannot be used interchangeably. Please note that up to 8% of patients who smoke may see values 5.1-10.0 ng/ml and 1% of patients who smoke may see CEA levels >10.0 ng/ml. Performed at [facility], [address] [place], [place] [address]    Lab Results  Component Value Date   [id number] 01/20/2024   STUDIES:  EXAM: 02/16/2024 CT ABDOMEN WITHOUT CONTRAST IMPRESSION: 1. Large bilateral pleural effusions. 2. Small pericardial effusion. 3. No acute abdominal pathology  identified. 4. Aortic atherosclerosis (ICD10-I70.0).  EXAM: 10/21/2023 CHEST - 2 VIEW IMPRESSION: Stable small right pleural effusion and mild right basilar atelectasis. No new or progressive disease.   HISTORY:   Allergies:  Allergies  Allergen Reactions   Pneumococcal Vaccines Swelling    Current Medications: Current Outpatient Medications  Medication Sig Dispense Refill   albuterol  (PROVENTIL  HFA;VENTOLIN  HFA) 108 (90 BASE) MCG/ACT inhaler Inhale 2 puffs into the lungs every 6 (six) hours as needed for wheezing or shortness of breath.     allopurinol  (ZYLOPRIM ) 300 MG tablet Place 1 tablet (300 mg total) into feeding tube daily.     aspirin  EC 81 MG tablet Take 1 tablet (81 mg total) by mouth daily. Swallow whole.     Cholecalciferol (VITAMIN D ) 50 MCG (2000 UT) tablet Place 1 tablet (2,000 Units total) into feeding tube daily.     dasatinib  (SPRYCEL ) 100 MG tablet Place 1 tablet (100 mg total) into feeding tube daily.     diclofenac Sodium (VOLTAREN) 1 % GEL Apply 2 g topically at bedtime as needed (Pain).     famotidine  (PEPCID ) 40 MG tablet Place 1 tablet (40 mg total) into feeding tube daily.     fluticasone  (FLONASE ) 50 MCG/ACT nasal spray Place 2 sprays into both nostrils daily.     folic acid  (FOLVITE ) 1 MG tablet Place 1 tablet (1 mg total) into feeding tube daily.     HYDROcodone -acetaminophen  (NORCO/VICODIN) 5-325 MG tablet Place 1 tablet into feeding tube every 6 (six) hours as needed for moderate pain (pain score 4-6). 15 tablet 0   methocarbamol  (ROBAXIN ) 750 MG tablet Place 1 tablet (750 mg total) into feeding tube every 6 (six) hours as needed for muscle spasms.     midodrine  (PROAMATINE ) 5 MG tablet Place 2 tablets (10 mg total) into feeding tube  2 (two) times daily with a meal.     Multiple Vitamin (MULTIVITAMIN WITH MINERALS) TABS tablet Place 1 tablet into feeding tube daily.     Nutritional Supplements (FEEDING SUPPLEMENT, OSMOLITE 1.5 CAL,) LIQD Place 1,000  mLs into feeding tube continuous. At  55ml/hr     polyethylene glycol (MIRALAX  / GLYCOLAX ) 17 g packet Place 17 g into feeding tube daily as needed for moderate constipation.     Protein (FEEDING SUPPLEMENT, PROSOURCE TF20,) liquid Place 60 mLs into feeding tube daily.     tamsulosin  (FLOMAX ) 0.4 MG CAPS capsule Take 2 capsules (0.8 mg total) by mouth daily.     torsemide  40 MG TABS Place 20 mg into feeding tube daily.     TRELEGY ELLIPTA 100-62.5-25 MCG/INH AEPB Inhale 1 puff into the lungs daily at 6 (six) AM.     Water  For Irrigation, Sterile (FREE WATER ) SOLN Place 200 mLs into feeding tube every 8 (eight) hours.     No current facility-administered medications for this visit.    I,Jasmine M Lassiter,acting as a scribe for Kyle VEAR Cornish, MD.,have documented all relevant documentation on the behalf of Kyle VEAR Cornish, MD,as directed by  Kyle VEAR Cornish, MD while in the presence of Kyle VEAR Cornish, MD.  I have reviewed this report as typed by the medical scribe, and it is complete and accurate.

## 2024-05-12 ENCOUNTER — Inpatient Hospital Stay: Admitting: Oncology

## 2024-05-12 ENCOUNTER — Inpatient Hospital Stay

## 2024-05-13 ENCOUNTER — Inpatient Hospital Stay

## 2024-05-13 ENCOUNTER — Inpatient Hospital Stay: Admitting: Hematology and Oncology

## 2024-05-14 ENCOUNTER — Inpatient Hospital Stay

## 2024-05-14 ENCOUNTER — Inpatient Hospital Stay: Admitting: Hematology and Oncology

## 2024-05-14 ENCOUNTER — Other Ambulatory Visit: Payer: Self-pay | Admitting: Hematology and Oncology

## 2024-05-14 DIAGNOSIS — C9210 Chronic myeloid leukemia, BCR/ABL-positive, not having achieved remission: Secondary | ICD-10-CM

## 2024-05-14 NOTE — Progress Notes (Deleted)
 [facility]  [address] [place],  [place]  [address] [phone]  Clinic Day: 05/14/24   Referring physician: Nestor Elston NOVAK, Kyle Montoya  ASSESSMENT & PLAN:  Assessment:   1. 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  He will continue to take dastinib 100 mg daily,  However, his recent chest CT showed bilateral pleural effusions, which are new.  As there is no history of congestive heart failure, the question is are these effusions related to his dasatinib .  He will have a chest x-ray in 3 months.  If his effusions are worse, then a change in CML therapy would need to be considered.  He denies being significantly short of breath at this time.  His CML transcript levels will be reassessed in 3 months   2. History of stage IA non-small cell lung cancer in 2016 treated with surgery alone.  His October, 2024 CT scans showed no evidence of disease recurrence.     3. History of stage IA cholangiocarcinoma in 2014 treated with surgery alone. His October, 2024 CT scans showed no evidence of disease recurrence.     4. B12 deficiency.  He was on B12 injections monthly with his primary care office. He missed his last one and so it has been over a month since his last one and yet his level was good at 667 so I told him he can stop the B-12 injections for now. His B12 level will be rechecked before his next visit.   5. Alcoholic Hepatitis. He knows he needs to quit drinking but is struggling to do so.   Plan: Patient was admitted into the hospital in January, 2025 and was found to have acute congestive heart failure. He was instructed to stop Lasix  and was placed on torsemide  20 mg and recommened follow-up with his cardiologist. He followed up with his cardiologist and received a good report from him. He had a echocardiogram in December, 2024 which was normal. I agreed with stopping Lasix  and will take that off of his med list. I instructed him to continue taking torsemide  20 mg at 3 pills  daily as instructed. He had labs done on 10/21/2023 and he had a WBC of 5.0, hemoglobin of 13.6, and platelet count of 256,000. He had a slightly low sodium of 134, a elevated AST of 78, and his potassium was low-normal at 3.5 improved from 3.4. The rest of his CMP was normal. I instructed him to stay on his oral potassium 2-3 daily and try to increase his fluids. PCR for BCR/ABL was negative. However, his CEA dropped from 7.9 to 3.33 but his CA 19.9 was elevated at 46 up from 36.  He notes not having a B-12 injection for over a month now. His B-12 level was 667 as of January, 74, 2025 and was previously 370. I advised that he stop his B-12 injections and will recheck his levels in the future. I also provided a copy of his labs to bring to his next PCP appointment. I will see him back in 3 months with CBC, CMP, CEA, CA 19.9, and PCR for BCR/ABL done a week before his appointment with me in April. The patient understands the plans discussed today and is in agreement with them.  He knows to contact our office if he develops concerns prior to his next appointment.   I provided 20 minutes of face-to-face time during this encounter and > 50% was spent counseling as documented under my assessment and  plan.   Kyle VEAR Cornish, Kyle Montoya [facility] OF [name] [address] [place] [address] Dept: [phone] Dept Fax: [phone]   No orders of the defined types were placed in this encounter.   CHIEF COMPLAINT:  CC: Chronic myelogenous leukemia with history of lung cancer and cholangiocarcinoma  Current Treatment: Dasatinib  100 mg daily  HISTORY OF PRESENT ILLNESS:  The patient is a 73 year old man with CML which has been kept under ideal control with dasatinib .  He comes in today for routine follow-up, as well as to go his CT scans, which were done to ensure there is no radiographic evidence of disease recurrence, as it pertains to both  his early stage lung and biliary cancers.  Since his last visit, the patient has been doing well.  He denies having any new symptoms or findings which concern him for overt signs of disease recurrence.    Chronic myelogenous leukemia diagnosed in October 2015 when he presented with leukocytosis.  He was originally placed on nilotinib, but developed a severe pruritic rash, so was switched to dasatinib  100 mg daily.  He achieved a major molecular remission by May 2016.   He also has a history of a stage IA cholangiocarcinoma, treated with surgical resection in June 2014.   He then underwent surgical resection of a stage IA (T1 N0 M0) non-small cell lung cancer in February 2016.  Pathology revealed a 3 cm, well-differentiated, adenocarcinoma with negative nodes.  Adjuvant chemotherapy for his lung cancer was not recommended.  He has chronic back pain and continues seeing spine specialist.  He apparently has significant degenerative disc disease.  He has also had upper abdominal pain, which he attributes to dasatinib .  He uses hydrocodone /APAP as needed for his pain.  In May 2018, PCR was positive at 0.012%, consistent with residual CML.  Repeat PCR in August 2018 revealed a major molecular response.  His CML has remained in remission since that time.  CT chest, abdomen and pelvis in November 2019 did not reveal evidence of recurrence of either malignancy.     CT chest in August 2020 did not reveal any evidence of recurrent lung malignancy.  He has had hypocalcemia, so was instructed to take calcium 600 mg twice daily.  When he was seen in November 2020, had been having lower leg pain worsening throughout the day.  He underwent arterial ultrasound/ankle brachial indices, which was normal.  The pain was then felt to possibly be degenerative in nature.  He reported constipation due to the calcium and as his on calcium was normal at that time,  we decreased the calcium to daily.  He had worsening neutropenia and was  found to have B12 and placed on B12 injections weekly for 4 weeks, then monthly.  Serum folate was normal.  He had persistent abdominal pain, so was referred to Dr. Charlanne.  He was seen by Dr. Charlanne in 2021 and had 2 gastric ulcers and H. Pylori infection, which was treated.  He saw Dr. Marda earlier in 2021 as well, and PSA was normal. When he was seen in August, he had recurrent hypocalcemia, with a calcium of 8.3.  He could not tolerate the calcium supplement, so we recommended that he take Tums 4 times daily.  He continued to complain of intermittent abdominal pain, which we have attributed to adhesions.  He has had rotator cuff surgery in January 2022.  He has had  a colonoscopy by Dr. Charlanne with findings of a large polyp.  He will be going back on August 8th to have this resected through colonoscopy.  He missed his appointment in May but Dr. Charlanne told him his labs were good.  I looked up and his PCR for BCR/ABL continues to be negative on the May reading.  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HCC)  02/15/2013 Initial Diagnosis   Carcinoma of biliary tract (HCC)   03/06/2013 Cancer Staging   Staging form: Distal Bile Ducts, AJCC 7th Edition - Clinical stage from 03/06/2013: Stage IA (T1, N0, M0) - Signed by Kyle Kyle DEL, Kyle Montoya on 11/09/2020 Staged by: Managing physician Diagnostic confirmation: Positive histology Specimen type: Excision Histopathologic type: Cholangiocarcinoma Stage prefix: Initial diagnosis Lymph-vascular invasion (LVI): LVI not present (absent)/not identified Residual tumor (R): R0 - None Stage used in treatment planning: Yes National guidelines used in treatment planning: Yes Type of national guideline used in treatment planning: NCCN   Non-small cell carcinoma of lung (HCC)  11/08/2014 Cancer Staging   Staging form: Lung, AJCC 7th Edition - Clinical stage from 11/08/2014: Stage IA (T1b, N0, M0) - Signed by Kyle Kyle DEL, Kyle Montoya on 11/09/2020 Staged by:  Managing physician Diagnostic confirmation: Positive histology Specimen type: Excision Histopathologic type: Adenocarcinoma, NOS Stage prefix: Initial diagnosis Laterality: Left Tumor size (mm): 30 Histologic grade (G): G1 Lymph-vascular invasion (LVI): LVI not present (absent)/not identified Residual tumor (R): R0 - None Pleural/elastic layer invasion: PL0 Prognostic indicators: Resection LUL Stage used in treatment planning: Yes National guidelines used in treatment planning: Yes Type of national guideline used in treatment planning: NCCN   01/31/2015 Initial Diagnosis   Non-small cell carcinoma of lung (HCC)      INTERVAL HISTORY:  Kyle Montoya is here for routine follow up for chronic myelogenous leukemia with history of lung cancer and cholangiocarcinoma. Patient states that he feels *** and ***.       He denies fever, chills, night sweats, or other signs of infection. He denies cardiorespiratory and gastrointestinal issues. He  denies pain. His appetite is *** and His weight {Weight change:10426}.   Patient states that he feels well and has no complaints of pain. He endorses not smoking in the last 6 months but continues to drink some alcohol. Patient was admitted into the hospital in January, 2025 and was found to have acute congestive heart failure. He was instructed to stop Lasix  and was placed on torsemide  20 mg and recommened follow-up with his cardiologist. He followed up with his cardiologist and received a good report from him. He had a echocardiogram in December, 2024 which was normal. I agreed with stopping Lasix  and will take that off of his med list. I instructed him to continue taking torsemide  20 mg but 3 pills daily as instructed. He had labs done on 10/21/2023 and he had a WBC of 5.0, hemoglobin of 13.6, and platelet count of 256,000. He had a slightly low sodium of 134, a elevated AST of 78, and his potassium was low-normal at 3.5 improved from 3.4. The rest of his CMP  was normal. I instructed him to stay on his oral potassium 2-3 daily and try to increase his fluids. PCR for BCR/ABL was negative. However, his CEA dropped from 7.9 to 3.33 but his CA 19.9 was elevated at 46 up from 36.  He notes not having a B-12 injection for over a month now. His B-12 level was 667 as of January, 74, 2025 and was previously 370.  I advised that he stop his B-12 injections and will recheck his levels in the future. I also provided a copy of his labs to bring to his next PCP appointment. I will see him back in 3 months with CBC, CMP, CEA, CA 19.9, and PCR for BCR/ABL done a week before his appointment with me in April. He denies signs of infection such as sore throat, sinus drainage, cough, or urinary symptoms.  He denies fevers or recurrent chills. He denies pain. He denies nausea, vomiting, chest pain, dyspnea or cough. His appetite is good and his weight has increased 7 pounds over last 3 weeks.  REVIEW OF SYSTEMS:  Review of Systems  Constitutional: Negative.  Negative for appetite change, chills, diaphoresis, fatigue, fever and unexpected weight change.  HENT:  Negative.  Negative for hearing loss, lump/mass, mouth sores, nosebleeds, sore throat, tinnitus, trouble swallowing and voice change.   Eyes: Negative.  Negative for eye problems and icterus.  Respiratory: Negative.  Negative for chest tightness, cough, hemoptysis, shortness of breath and wheezing.   Cardiovascular: Negative.  Negative for chest pain, leg swelling and palpitations.  Gastrointestinal: Negative.  Negative for abdominal distention, abdominal pain, blood in stool, constipation, diarrhea, nausea, rectal pain and vomiting.  Endocrine: Negative.   Genitourinary: Negative.  Negative for bladder incontinence, difficulty urinating, dyspareunia, dysuria, frequency, hematuria, nocturia, pelvic pain and penile discharge.   Musculoskeletal: Negative.  Negative for arthralgias, back pain, flank pain, gait problem, myalgias,  neck pain and neck stiffness.  Skin: Negative.  Negative for itching, rash and wound.  Neurological: Negative.  Negative for dizziness, extremity weakness, gait problem, headaches, light-headedness, numbness, seizures and speech difficulty.  Hematological: Negative.  Negative for adenopathy. Does not bruise/bleed easily.  Psychiatric/Behavioral: Negative.  Negative for confusion, decreased concentration, depression, sleep disturbance and suicidal ideas. The patient is not nervous/anxious.   All other systems reviewed and are negative.    VITALS:  There were no vitals taken for this visit.  Wt Readings from Last 3 Encounters:  02/19/24 192 lb 7.4 oz (87.3 kg)  12/11/23 193 lb 12.6 oz (87.9 kg)  10/30/23 208 lb 3.2 oz (94.4 kg)    There is no height or weight on file to calculate BMI.  Performance status (ECOG): 0 - Asymptomatic  PHYSICAL EXAM:  Physical Exam Vitals and nursing note reviewed.  Constitutional:      General: He is not in acute distress.    Appearance: Normal appearance. He is normal weight. He is not ill-appearing, toxic-appearing or diaphoretic.  HENT:     Head: Normocephalic and atraumatic.     Right Ear: Tympanic membrane, ear canal and external ear normal. There is no impacted cerumen.     Left Ear: Tympanic membrane, ear canal and external ear normal. There is no impacted cerumen.     Nose: Nose normal. No congestion or rhinorrhea.     Mouth/Throat:     Mouth: Mucous membranes are moist.     Pharynx: Oropharynx is clear. No oropharyngeal exudate or posterior oropharyngeal erythema.  Eyes:     General: No scleral icterus.       Right eye: No discharge.        Left eye: No discharge.     Extraocular Movements: Extraocular movements intact.     Conjunctiva/sclera: Conjunctivae normal.     Pupils: Pupils are equal, round, and reactive to light.  Neck:     Vascular: No carotid bruit.  Cardiovascular:     Rate and Rhythm: Normal  rate and regular rhythm.      Pulses: Normal pulses.     Heart sounds: Normal heart sounds. No murmur heard.    No friction rub. No gallop.  Pulmonary:     Effort: Pulmonary effort is normal. No respiratory distress.     Breath sounds: Normal breath sounds. No stridor. No wheezing, rhonchi or rales.  Chest:     Chest wall: No tenderness.  Abdominal:     General: Bowel sounds are normal. There is no distension.     Palpations: Abdomen is soft. There is no hepatomegaly, splenomegaly or mass.     Tenderness: There is abdominal tenderness in the right upper quadrant. There is no right CVA tenderness, left CVA tenderness, guarding or rebound.     Hernia: No hernia is present.     Comments: Tenderness in the right upper quadrant Mild hepatomegaly  Musculoskeletal:        General: No swelling, tenderness, deformity or signs of injury. Normal range of motion.     Cervical back: Normal range of motion and neck supple. No rigidity or tenderness.     Right lower leg: No edema.     Left lower leg: No edema.  Lymphadenopathy:     Cervical: No cervical adenopathy.  Skin:    General: Skin is warm and dry.     Coloration: Skin is not jaundiced or pale.     Findings: No bruising, erythema, lesion or rash.  Neurological:     General: No focal deficit present.     Mental Status: He is alert and oriented to person, place, and time. Mental status is at baseline.     Cranial Nerves: No cranial nerve deficit.     Sensory: No sensory deficit.     Motor: No weakness.     Coordination: Coordination normal.     Gait: Gait normal.     Deep Tendon Reflexes: Reflexes normal.  Psychiatric:        Mood and Affect: Mood normal.        Behavior: Behavior normal.        Thought Content: Thought content normal.        Judgment: Judgment normal.    LABS:      Latest Ref Rng & Units 02/18/2024    2:51 AM 02/17/2024    2:28 AM 02/16/2024    2:19 AM  CBC  WBC 4.0 - 10.5 K/uL 4.2  3.9  5.1   Hemoglobin 13.0 - 17.0 g/dL 7.7  7.0  7.1    Hematocrit 39.0 - 52.0 % 23.7  21.5  21.7   Platelets 150 - 400 K/uL 290  242  244       Latest Ref Rng & Units 02/19/2024    5:39 AM 02/18/2024    2:51 AM 02/17/2024    2:28 AM  CMP  Glucose 70 - 99 mg/dL 880  883  90   BUN 8 - 23 mg/dL 9  9  11    Creatinine 0.61 - 1.24 mg/dL 9.42  9.39  9.47   Sodium 135 - 145 mmol/L 132  133  135   Potassium 3.5 - 5.1 mmol/L 4.2  4.0  3.8   Chloride 98 - 111 mmol/L 104  106  105   CO2 22 - 32 mmol/L 24  23  23    Calcium 8.9 - 10.3 mg/dL 7.6  7.7  7.8    Lab Results  Component Value Date   CEA1 7.9 (H) 07/17/2023  CEA 1.11 01/20/2024   /  CEA  Date Value Ref Range Status  07/17/2023 7.9 (H) 0.0 - 4.7 ng/mL Final    Comment:    (NOTE)                             Nonsmokers          <3.9                             Smokers             <5.6 Roche [employer] Electrochemiluminescence Immunoassay (ECLIA) Values obtained with different assay methods or kits cannot be used interchangeably.  Results cannot be interpreted as absolute evidence of the presence or absence of malignant disease. Performed At: [facility] [address] [place], [place] [id number] Jennette Shorter Kyle Montoya Ey:[phone]    CEA ([facility])  Date Value Ref Range Status  01/20/2024 1.11 0.00 - 5.00 ng/mL Final    Comment:    (NOTE) This test was performed using Beckman Coulter's paramagnetic chemiluminescent immunoassay. Values obtained from different assay methods cannot be used interchangeably. Please note that up to 8% of patients who smoke may see values 5.1-10.0 ng/ml and 1% of patients who smoke may see CEA levels >10.0 ng/ml. Performed at [facility], [address] [place], [place] [address]    Lab Results  Component Value Date   [id number] 01/20/2024   STUDIES:  EXAM: 02/16/2024 CT ABDOMEN WITHOUT CONTRAST IMPRESSION: 1. Large bilateral pleural effusions. 2. Small pericardial effusion. 3. No acute abdominal pathology  identified. 4. Aortic atherosclerosis (ICD10-I70.0).  EXAM: 10/21/2023 CHEST - 2 VIEW IMPRESSION: Stable small right pleural effusion and mild right basilar atelectasis. No new or progressive disease.   HISTORY:   Allergies:  Allergies  Allergen Reactions   Pneumococcal Vaccines Swelling    Current Medications: Current Outpatient Medications  Medication Sig Dispense Refill   albuterol  (PROVENTIL  HFA;VENTOLIN  HFA) 108 (90 BASE) MCG/ACT inhaler Inhale 2 puffs into the lungs every 6 (six) hours as needed for wheezing or shortness of breath.     allopurinol  (ZYLOPRIM ) 300 MG tablet Place 1 tablet (300 mg total) into feeding tube daily.     aspirin  EC 81 MG tablet Take 1 tablet (81 mg total) by mouth daily. Swallow whole.     Cholecalciferol (VITAMIN D ) 50 MCG (2000 UT) tablet Place 1 tablet (2,000 Units total) into feeding tube daily.     dasatinib  (SPRYCEL ) 100 MG tablet Place 1 tablet (100 mg total) into feeding tube daily.     diclofenac Sodium (VOLTAREN) 1 % GEL Apply 2 g topically at bedtime as needed (Pain).     famotidine  (PEPCID ) 40 MG tablet Place 1 tablet (40 mg total) into feeding tube daily.     fluticasone  (FLONASE ) 50 MCG/ACT nasal spray Place 2 sprays into both nostrils daily.     folic acid  (FOLVITE ) 1 MG tablet Place 1 tablet (1 mg total) into feeding tube daily.     HYDROcodone -acetaminophen  (NORCO/VICODIN) 5-325 MG tablet Place 1 tablet into feeding tube every 6 (six) hours as needed for moderate pain (pain score 4-6). 15 tablet 0   methocarbamol  (ROBAXIN ) 750 MG tablet Place 1 tablet (750 mg total) into feeding tube every 6 (six) hours as needed for muscle spasms.     midodrine  (PROAMATINE ) 5 MG tablet Place 2 tablets (10 mg total) into feeding tube  2 (two) times daily with a meal.     Multiple Vitamin (MULTIVITAMIN WITH MINERALS) TABS tablet Place 1 tablet into feeding tube daily.     Nutritional Supplements (FEEDING SUPPLEMENT, OSMOLITE 1.5 CAL,) LIQD Place 1,000  mLs into feeding tube continuous. At  55ml/hr     polyethylene glycol (MIRALAX  / GLYCOLAX ) 17 g packet Place 17 g into feeding tube daily as needed for moderate constipation.     Protein (FEEDING SUPPLEMENT, PROSOURCE TF20,) liquid Place 60 mLs into feeding tube daily.     tamsulosin  (FLOMAX ) 0.4 MG CAPS capsule Take 2 capsules (0.8 mg total) by mouth daily.     torsemide  40 MG TABS Place 20 mg into feeding tube daily.     TRELEGY ELLIPTA 100-62.5-25 MCG/INH AEPB Inhale 1 puff into the lungs daily at 6 (six) AM.     Water  For Irrigation, Sterile (FREE WATER ) SOLN Place 200 mLs into feeding tube every 8 (eight) hours.     No current facility-administered medications for this visit.    I,Kyle Montoya,acting as a Neurosurgeon for [employer], Kyle Montoya.,have documented all relevant documentation on the behalf of Kyle DELENA Bach, Kyle Montoya,as directed by  Kyle DELENA Bach, Kyle Montoya while in the presence of Kyle DELENA Bach, Kyle Montoya.  I have reviewed this report as typed by the medical scribe, and it is complete and accurate.

## 2024-05-18 ENCOUNTER — Other Ambulatory Visit: Payer: Self-pay

## 2024-05-18 ENCOUNTER — Telehealth (HOSPITAL_COMMUNITY): Payer: Self-pay | Admitting: Nurse Practitioner

## 2024-05-18 ENCOUNTER — Emergency Department (HOSPITAL_COMMUNITY)
Admission: EM | Admit: 2024-05-18 | Discharge: 2024-05-18 | Discharge status: Against Medical Advice | Attending: Emergency Medicine | Admitting: Emergency Medicine

## 2024-05-18 DIAGNOSIS — Z5321 Procedure and treatment not carried out due to patient leaving prior to being seen by health care provider: Secondary | ICD-10-CM | POA: Diagnosis not present

## 2024-05-18 DIAGNOSIS — Z431 Encounter for attention to gastrostomy: Secondary | ICD-10-CM | POA: Diagnosis present

## 2024-05-18 NOTE — Telephone Encounter (Signed)
 Patient verbally declined modified barium swallow test at this time, stating he was eating and drinking whatever he wanted and did not feel it was necessary at this time. Patient was made aware he could call [facility] for a new order if anything changes. (AHARRIS)

## 2024-05-18 NOTE — ED Triage Notes (Signed)
 Pt states he needs his feeding tube taken out. Pt has hx of trouble swallowing and that's why it was placed.  Pt states he does not use tube and eats without it all the time. Denies nausea and vomiting.

## 2024-05-19 ENCOUNTER — Inpatient Hospital Stay: Admitting: Hematology and Oncology

## 2024-05-19 ENCOUNTER — Inpatient Hospital Stay

## 2024-05-21 ENCOUNTER — Ambulatory Visit: Attending: Cardiology | Admitting: Cardiology

## 2024-05-26 ENCOUNTER — Inpatient Hospital Stay: Attending: Hematology and Oncology | Admitting: Hematology and Oncology

## 2024-05-26 ENCOUNTER — Inpatient Hospital Stay

## 2024-05-26 ENCOUNTER — Telehealth: Payer: Self-pay | Admitting: Hematology and Oncology

## 2024-05-26 NOTE — Telephone Encounter (Signed)
 05/26/24 lvm to r/s appts-Spoke with son on 05/19/24 and he was going to bring him.

## 2024-06-07 ENCOUNTER — Other Ambulatory Visit (HOSPITAL_COMMUNITY): Payer: Self-pay

## 2024-06-07 ENCOUNTER — Telehealth (HOSPITAL_COMMUNITY): Payer: Self-pay | Admitting: Nurse Practitioner

## 2024-06-07 DIAGNOSIS — R1310 Dysphagia, unspecified: Secondary | ICD-10-CM

## 2024-06-07 DIAGNOSIS — C9210 Chronic myeloid leukemia, BCR/ABL-positive, not having achieved remission: Secondary | ICD-10-CM

## 2024-06-07 NOTE — Telephone Encounter (Signed)
 Patient called [facility] 9/15 inquiring about scheduling a swallow test, he was informed that he gave a verbal decline to cancel OP MBS order on 05/18/24 stating he could eat and drink whatever he wanted. He was advised to reach out to PCP for new modified barium swallow test order and then we would help him schedule. Spoke with [facility] and let her know the same. (AHARRIS)

## 2024-06-09 ENCOUNTER — Other Ambulatory Visit (HOSPITAL_COMMUNITY): Payer: Self-pay | Admitting: Nurse Practitioner

## 2024-06-09 DIAGNOSIS — Z931 Gastrostomy status: Secondary | ICD-10-CM

## 2024-06-09 DIAGNOSIS — R059 Cough, unspecified: Secondary | ICD-10-CM

## 2024-06-09 DIAGNOSIS — R1312 Dysphagia, oropharyngeal phase: Secondary | ICD-10-CM

## 2024-06-10 ENCOUNTER — Telehealth: Payer: Self-pay | Admitting: Hematology and Oncology

## 2024-06-10 NOTE — Telephone Encounter (Signed)
 06/10/24 LVM to reschedule appt/db

## 2024-06-11 ENCOUNTER — Telehealth: Payer: Self-pay | Admitting: Hematology and Oncology

## 2024-06-11 NOTE — Telephone Encounter (Signed)
 06/11/24 Spoke with patient and he will have son call back to reschedule appts.

## 2024-06-15 ENCOUNTER — Ambulatory Visit (HOSPITAL_COMMUNITY): Admission: RE | Admit: 2024-06-15 | Source: Ambulatory Visit

## 2024-06-15 ENCOUNTER — Ambulatory Visit (HOSPITAL_COMMUNITY)

## 2024-06-22 ENCOUNTER — Ambulatory Visit (HOSPITAL_COMMUNITY)

## 2024-06-22 ENCOUNTER — Ambulatory Visit (HOSPITAL_COMMUNITY): Admission: RE | Admit: 2024-06-22 | Source: Ambulatory Visit

## 2024-06-29 ENCOUNTER — Inpatient Hospital Stay: Attending: Oncology

## 2024-06-29 ENCOUNTER — Inpatient Hospital Stay: Admitting: Oncology

## 2024-06-29 DIAGNOSIS — C3492 Malignant neoplasm of unspecified part of left bronchus or lung: Secondary | ICD-10-CM

## 2024-06-29 DIAGNOSIS — C9210 Chronic myeloid leukemia, BCR/ABL-positive, not having achieved remission: Secondary | ICD-10-CM

## 2024-07-06 ENCOUNTER — Encounter (HOSPITAL_COMMUNITY): Payer: Self-pay

## 2024-07-06 ENCOUNTER — Emergency Department (HOSPITAL_COMMUNITY)
Admission: EM | Admit: 2024-07-06 | Discharge: 2024-07-06 | Disposition: A | Discharge status: Home / Self Care | Attending: Emergency Medicine | Admitting: Emergency Medicine

## 2024-07-06 ENCOUNTER — Emergency Department (HOSPITAL_COMMUNITY)
Admission: RE | Admit: 2024-07-06 | Discharge: 2024-07-06 | Disposition: A | Source: Ambulatory Visit | Attending: Nurse Practitioner | Admitting: Nurse Practitioner

## 2024-07-06 ENCOUNTER — Other Ambulatory Visit: Payer: Self-pay

## 2024-07-06 ENCOUNTER — Emergency Department (HOSPITAL_COMMUNITY)

## 2024-07-06 ENCOUNTER — Ambulatory Visit (HOSPITAL_COMMUNITY): Admission: RE | Admit: 2024-07-06 | Source: Ambulatory Visit

## 2024-07-06 DIAGNOSIS — R600 Localized edema: Secondary | ICD-10-CM | POA: Insufficient documentation

## 2024-07-06 DIAGNOSIS — R2243 Localized swelling, mass and lump, lower limb, bilateral: Secondary | ICD-10-CM | POA: Diagnosis present

## 2024-07-06 LAB — CBC WITH DIFFERENTIAL/PLATELET
Abs Immature Granulocytes: 0.02 K/uL (ref 0.00–0.07)
Basophils Absolute: 0.1 K/uL (ref 0.0–0.1)
Basophils Relative: 2 %
Eosinophils Absolute: 0.1 K/uL (ref 0.0–0.5)
Eosinophils Relative: 3 %
HCT: 35.9 % — ABNORMAL LOW (ref 39.0–52.0)
Hemoglobin: 11.5 g/dL — ABNORMAL LOW (ref 13.0–17.0)
Immature Granulocytes: 0 %
Lymphocytes Relative: 42 %
Lymphs Abs: 1.9 K/uL (ref 0.7–4.0)
MCH: 29.3 pg (ref 26.0–34.0)
MCHC: 32 g/dL (ref 30.0–36.0)
MCV: 91.6 fL (ref 80.0–100.0)
Monocytes Absolute: 0.7 K/uL (ref 0.1–1.0)
Monocytes Relative: 14 %
Neutro Abs: 1.8 K/uL (ref 1.7–7.7)
Neutrophils Relative %: 39 %
Platelets: 200 K/uL (ref 150–400)
RBC: 3.92 MIL/uL — ABNORMAL LOW (ref 4.22–5.81)
RDW: 19.4 % — ABNORMAL HIGH (ref 11.5–15.5)
WBC: 4.6 K/uL (ref 4.0–10.5)
nRBC: 0 % (ref 0.0–0.2)

## 2024-07-06 LAB — BASIC METABOLIC PANEL WITH GFR
Anion gap: 12 (ref 5–15)
BUN: 11 mg/dL (ref 8–23)
CO2: 20 mmol/L — ABNORMAL LOW (ref 22–32)
Calcium: 7.7 mg/dL — ABNORMAL LOW (ref 8.9–10.3)
Chloride: 103 mmol/L (ref 98–111)
Creatinine, Ser: 0.71 mg/dL (ref 0.61–1.24)
GFR, Estimated: >60 mL/min (ref >60)
Glucose, Bld: 78 mg/dL (ref 70–99)
Potassium: 3.5 mmol/L (ref 3.5–5.1)
Sodium: 135 mmol/L (ref 135–145)

## 2024-07-06 LAB — TROPONIN I (HIGH SENSITIVITY): Troponin I (High Sensitivity): 12 ng/L (ref <18)

## 2024-07-06 LAB — BRAIN NATRIURETIC PEPTIDE: B Natriuretic Peptide: 75.7 pg/mL (ref 0.0–100.0)

## 2024-07-06 MED ORDER — FUROSEMIDE 10 MG/ML IJ SOLN
40.0000 mg | Freq: Once | INTRAMUSCULAR | Status: AC
  Administered 2024-07-06: 40 mg via INTRAVENOUS
  Filled 2024-07-06: qty 4

## 2024-07-06 NOTE — Discharge Instructions (Addendum)
 Please take your torsemide  40 mg daily for the next week until you are seen by your cardiologist.  You should follow-up with your primary care doctor in the interim within 48 hours for reassessment.

## 2024-07-06 NOTE — ED Provider Notes (Signed)
 [place] EMERGENCY DEPARTMENT AT [facility] Provider Note   CSN: [id number] Arrival date & time: 07/06/24  0343     History Chief Complaint  Patient presents with   Leg Swelling    HPI Kyle Montoya is a 73 y.o. male presenting for chief complaint of bilateral lower extremity swelling and pain.  States he has been more fatigued than normal with some dyspnea on exertion but states is only mild.  Otherwise ambulatory though with difficulty tolerating p.o. intake.  States he mostly came in tonight because he was not certain how much of his fluid pills he supposed to be taking..   Patient's recorded medical, surgical, social, medication list and allergies were reviewed in the Snapshot window as part of the initial history.   Review of Systems   Review of Systems  Constitutional:  Negative for chills and fever.  HENT:  Negative for ear pain and sore throat.   Eyes:  Negative for pain and visual disturbance.  Respiratory:  Negative for cough and shortness of breath.   Cardiovascular:  Negative for chest pain and palpitations.  Gastrointestinal:  Negative for abdominal pain and vomiting.  Genitourinary:  Negative for dysuria and hematuria.  Musculoskeletal:  Negative for arthralgias and back pain.  Skin:  Negative for color change and rash.  Neurological:  Negative for seizures and syncope.  All other systems reviewed and are negative.   Physical Exam Updated Vital Signs BP 118/75   Pulse 71   Temp 97.9 F (36.6 C) (Oral)   Resp 18   Ht 5' 11 (1.803 m)   Wt 95.3 kg   SpO2 99%   BMI 29.29 kg/m  Physical Exam Vitals and nursing note reviewed.  Constitutional:      General: He is not in acute distress.    Appearance: He is well-developed.  HENT:     Head: Normocephalic and atraumatic.  Eyes:     Conjunctiva/sclera: Conjunctivae normal.  Cardiovascular:     Rate and Rhythm: Normal rate and regular rhythm.  Pulmonary:     Effort: Pulmonary effort is  normal. No respiratory distress.  Abdominal:     General: Abdomen is flat. There is no distension.  Musculoskeletal:        General: No swelling or deformity.     Right lower leg: Edema present.     Left lower leg: Edema present.  Skin:    General: Skin is warm and dry.     Capillary Refill: Capillary refill takes less than 2 seconds.  Neurological:     Mental Status: He is alert and oriented to person, place, and time. Mental status is at baseline.      ED Course/ Medical Decision Making/ A&P    Procedures Procedures   Medications Ordered in ED Medications  furosemide  (LASIX ) injection 40 mg (has no administration in time range)    Medical Decision Making:    73 year old male presenting with leg swelling.  He is in no acute distress ambulatory tolerating p.o. intake.  States that he has not been compliant his medication because he does not know how much he supposed to be taking.  On chart review switch to taking 40 mg of torsemide  daily.  He states he takes it maybe once a week.  He was educated on the importance of this.  Lab work was performed shows no evidence of acute kidney injury, and consistent with gross volume overload of the central venous system, no evidence of pneumonia or ACS.  He has clear peripheral edema and was educated on importance of preventing central edema by being compliant on medications, elevating his legs and monitoring fluid intake.  He feels comfortable discharge, resting comfortably in serial assessments in no acute distress. It is painless so DVTs are considered unlikely especially in the setting of the chronicity.  No evidence of cellulitis, mild venous stasis.    Disposition:  I have considered need for hospitalization, however, considering all of the above, I believe this patient is stable for discharge at this time.  Patient/family educated about specific return precautions for given chief complaint and symptoms.  Patient/family educated about  follow-up with PC.     Patient/family expressed understanding of return precautions and need for follow-up. Patient spoken to regarding all imaging and laboratory results and appropriate follow up for these results. All education provided in verbal form with additional information in written form. Time was allowed for answering of patient questions. Patient discharged.    Emergency Department Medication Summary:   Medications  furosemide  (LASIX ) injection 40 mg (has no administration in time range)       Clinical Impression:  1. Peripheral edema      Discharge   Final Clinical Impression(s) / ED Diagnoses Final diagnoses:  Peripheral edema    Rx / DC Orders ED Discharge Orders     None         Jerral Meth, MD 07/06/24 [id number]

## 2024-07-06 NOTE — ED Triage Notes (Signed)
 PT brought in by [facility] for leg edema and Abd tenderness due to feeding tube. PT states he hasn't taken his meds the past week or more and missed his lasix  and now in fluid overload. PT states he was suppose to have his feeding tube removed yesterday but skipped coming. VSS and alert and talking and walked to wheelchair from ems stretcher.

## 2024-07-08 ENCOUNTER — Telehealth (HOSPITAL_COMMUNITY): Payer: Self-pay | Admitting: Nurse Practitioner

## 2024-07-08 NOTE — Telephone Encounter (Signed)
 Order cancelled based on the following history: 06/15/24 patient called stating his back hurt and rescheduled. On 06/22/24 patient's son called at his appointment time stating his father did not want to come the appointment, no reason given. Verbally explained to patient's son the next reschedule would be the 3rd and final reschedule attempt and it would be important to show up. On 07/06/24 patient was a no show. If patient wishes to proceed with a swallow test, he will need to get a new order from his doctor's office. (AHARRIS)

## 2024-07-10 NOTE — Progress Notes (Incomplete)
 Assessment/Plan:     Kyle Montoya is a very pleasant 73 y.o. year old RH male with a history of hypertension, hyperlipidemia, biliary tract  cancer, CML,COPD,  NSCLCa lung, ACD, OA, PAD,  hyponatremia, diastolic CHF, ETOH abuse with withdrawal 01/2024, vit D deficiency, tobacco abuse, GERD,  seen today for evaluation of memory loss. MoCA today is .  Etiology is unclear, workup is in progress.  Patient needs assistance with ADLs*** Discussed starting donepezil 5 mg daily with goal of 10 mg daily if tolerated, patient agrees to proceed.   Memory Impairment of unclear etiology, concern for ***  MRI brain without contrast to assess for underlying structural abnormality and assess vascular load  Neurocognitive testing to further evaluate cognitive concerns and determine other underlying cause of memory changes, including potential contribution from sleep, anxiety, attention, or depression among others  Check B12, TSH Recommend good control of cardiovascular risk factors.   Continue to control mood as per PCP Tobacco and ETOH cessation counseled  Folllow up in ***months   Subjective:    The patient is accompanied by ***  who supplements  the history.    How long did patient have memory difficulties?  For about.  Patient reports some difficulty remembering new information, recent conversations, names. repeats oneself?  Endorsed Disoriented when walking into a room? Denies ***  Leaving objects in unusual places?  Denies.   Wandering behavior? Denies.   Any personality changes, or depression, anxiety? Denies *** Hallucinations or paranoia? Denies.   Seizures? Denies.    Any sleep changes?  Sleeps well *** Does not sleep well. **  frequent nightmares or dream reenactment, other REM behavior or sleepwalking   Sleep apnea? Denies.   Any hygiene concerns?  Denies.   Independent of bathing and dressing? Endorsed  Does the patient need help with medications?  is in charge *** Who is in  charge of the finances?  is in charge   *** Any changes in appetite?  He has gastrotomy  ***   Patient have trouble swallowing? History of oropharyngeal dysphagia,  Does the patient cook? No*** yes, denies forgetting common recipes or kitchen accidents *** Any headaches?  Denies.   Chronic pain? Denies.   Ambulates with difficulty? Denies. ***  Needs a cane*** Needs a walker *** to ambulate for stability.   Recent falls or head injuries? Denies.     Vision changes?  Denies any new issues.  Has a history of*** Any strokelike symptoms? Denies.   Any tremors? Denies. *** Any anosmia? Denies.   Any incontinence of urine? Denies.   Any bowel dysfunction? Denies.      Patient lives with ***  History of heavy alcohol intake? Denies.   History of heavy tobacco use? Denies.   Family history of dementia?   *** with dementia  Does patient drive? No longer drives  *** yes, denies getting lost.***   Pertinent labs: ***  MRI brain personally reviewed: ***  Allergies  Allergen Reactions   Pneumococcal Vaccines Swelling    Current Outpatient Medications  Medication Instructions   albuterol  (PROVENTIL  HFA;VENTOLIN  HFA) 108 (90 BASE) MCG/ACT inhaler 2 puffs, Every 6 hours PRN   allopurinol  (ZYLOPRIM ) 300 mg, Per Tube, Daily   aspirin  EC 81 mg, Oral, Daily, Swallow whole.   dasatinib  (SPRYCEL ) 100 mg, Per Tube, Daily   diclofenac Sodium (VOLTAREN) 2 g, At bedtime PRN   famotidine  (PEPCID ) 40 mg, Per Tube, Daily   fluticasone  (FLONASE ) 50 MCG/ACT nasal spray 2  sprays, Daily   folic acid  (FOLVITE ) 1 mg, Per Tube, Daily   HYDROcodone -acetaminophen  (NORCO/VICODIN) 5-325 MG tablet 1 tablet, Per Tube, Every 6 hours PRN   methocarbamol  (ROBAXIN ) 750 mg, Per Tube, Every 6 hours PRN   midodrine  (PROAMATINE ) 10 mg, Per Tube, 2 times daily with meals   Multiple Vitamin (MULTIVITAMIN WITH MINERALS) TABS tablet 1 tablet, Per Tube, Daily   Nutritional Supplements (FEEDING SUPPLEMENT, OSMOLITE 1.5 CAL,)  LIQD 1,000 mLs, Per Tube, Continuous, At  55ml/hr   polyethylene glycol (MIRALAX  / GLYCOLAX ) 17 g, Per Tube, Daily PRN   Protein (FEEDING SUPPLEMENT, PROSOURCE TF20,) liquid 60 mLs, Per Tube, Daily   tamsulosin  (FLOMAX ) 0.8 mg, Oral, Daily   Torsemide  20 mg, Per Tube, Daily   TRELEGY ELLIPTA 100-62.5-25 MCG/INH AEPB 1 puff, Daily   Vitamin D  2,000 Units, Per Tube, Daily   Water  For Irrigation, Sterile (FREE WATER ) SOLN 200 mLs, Per Tube, Every 8 hours     VITALS:  There were no vitals filed for this visit.   Physical Exam  :     No data to display              No data to display             HEENT:  Normocephalic, atraumatic.  The superficial temporal arteries are without ropiness or tenderness. Cardiovascular: Regular rate and rhythm. Lungs: Clear to auscultation bilaterally. Neck: There are no carotid bruits noted bilaterally. Orientation:  Alert and oriented to person, place and not to time***. No aphasia or dysarthria. Fund of knowledge is appropriate. Recent and remote memory impaired.  Attention and concentration are reduced***.  Able to name objects and repeat phrases. *** Delayed recall  /5 .*** Cranial nerves: There is good facial symmetry. Extraocular muscles are intact and visual fields are full to confrontational testing. Speech is fluent and clear. No tongue deviation. Hearing is intact to conversational tone.*** Tone: Tone is good throughout. Sensation: Sensation is intact to light touch.  Vibration is intact at the bilateral big toe.  Coordination: The patient has no difficulty with RAM's or FNF bilaterally. Normal finger to nose  Motor: Strength is 5/5 in the bilateral upper and lower extremities. There is no pronator drift. There are no fasciculations noted. DTR's: Deep tendon reflexes are 2/4 bilaterally. Gait and Station: The patient is able to ambulate without difficulty. Gait is cautious and narrow. Stride length is normal. ***      Thank you for  allowing us  the opportunity to participate in the care of this nice patient. Please do not hesitate to contact us  for any questions or concerns.   Total time spent on today's visit was *** minutes dedicated to this patient today, preparing to see patient, examining the patient, ordering tests and/or medications and counseling the patient, documenting clinical information in the EHR or other health record, independently interpreting results and communicating results to the patient/family, discussing treatment and goals, answering patient's questions and coordinating care.  Cc:  Tetter, Devin B, NP  Camie Sevin 07/10/2024 6:10 PM

## 2024-07-13 ENCOUNTER — Other Ambulatory Visit: Payer: Self-pay | Admitting: Oncology

## 2024-07-13 DIAGNOSIS — C9210 Chronic myeloid leukemia, BCR/ABL-positive, not having achieved remission: Secondary | ICD-10-CM

## 2024-07-15 ENCOUNTER — Encounter: Payer: Self-pay | Admitting: Physician Assistant

## 2024-07-15 ENCOUNTER — Encounter

## 2024-07-20 ENCOUNTER — Other Ambulatory Visit (HOSPITAL_COMMUNITY): Payer: Self-pay | Admitting: Nurse Practitioner

## 2024-07-20 DIAGNOSIS — R059 Cough, unspecified: Secondary | ICD-10-CM

## 2024-07-20 DIAGNOSIS — E785 Hyperlipidemia, unspecified: Secondary | ICD-10-CM | POA: Insufficient documentation

## 2024-07-20 DIAGNOSIS — R1310 Dysphagia, unspecified: Secondary | ICD-10-CM

## 2024-07-21 ENCOUNTER — Other Ambulatory Visit: Payer: Self-pay

## 2024-07-21 LAB — LAB REPORT - SCANNED
A1c: 4.5
EGFR: 99.3

## 2024-07-22 ENCOUNTER — Ambulatory Visit

## 2024-07-22 ENCOUNTER — Telehealth: Payer: Self-pay | Admitting: Oncology

## 2024-07-22 NOTE — Progress Notes (Deleted)
 Cardiology Consultation:    Date:  07/22/2024   ID:  Kyle Montoya, DOB [birth year], MRN [id number]  PCP:  Kyle Elston NOVAK, NP  Cardiologist:  Kyle JONELLE Kobus, MD   Referring MD: Kyle Elston NOVAK, NP   No chief complaint on file.    ASSESSMENT AND PLAN:   Kyle Montoya 73 year old male Problem List Items Addressed This Visit   None     History of Present Illness:    Kyle Montoya is a 73 y.o. male who is being seen today for follow-up visit. PCP is Tetter, Elston NOVAK, NP. Last visit with our office was 10/20/2023 with Kyle Hoover, NP-C. Follows up routinely with Kyle Montoya.  Has history of CAD noted on CT imaging, Lexiscan  stress test 07/10/2023 no ischemia, normal biventricular function on echocardiogram December 2024 with LVEF 55 to 60%, mild aortic insufficiency, mild TR, chronic heart failure with preserved EF, aortic atherosclerosis noted on CT imaging, past history of lung cancer, cholangiocarcinoma, CML, COPD.  Tobacco and alcohol abuse.  Initially seen for cardiology consult by Kyle Montoya for coronary and aortic atherosclerosis on CT chest.  Ischemic evaluation with Lexiscan  stress test was unremarkable.  Has had couple hospital admissions in December 2024 and January 2025 for acute on chronic heart failure with preserved ejection fraction.  In March he had an inpatient stay for weakness associated with acute hyponatremia and associated hypotension in the setting of ongoing diuresis with both furosemide  and torsemide , at discharge diuretics were discontinued and he was started on midodrine  for low blood pressures.  In May he had another inpatient stay for aspiration pneumonia and septic shock further complicated with alcohol withdrawal delirium, dysphagia.  Peg tube was placed and discharged to skilled nursing facility.   More recently he went to the ER at [facility] 07/06/2024 for symptoms of bilateral lower extremity edema swelling and pain and shortness  of breath and unsure about his required diuretic dose.  Past Medical History:  Diagnosis Date   Abdominal pain 09/13/2022   Unspecified abdominal pain     Abnormal colonoscopy 02/23/2021   Abnormal transaminases 02/27/2022   Acute diastolic (congestive) heart failure (HCC) 10/08/2023   Acute on chronic diastolic CHF (congestive heart failure) (HCC) 02/08/2024   Adenomatous polyp of descending colon 02/23/2021   Adverse drug reaction, initial encounter 07/04/2016   07/02/2016: Prevnar 23 vaccine given in right arm  07/02/2016: localized swelling  10/11/20167: Increase in swelling from shoulder to inner aspect of elbow.Red, warm and firm to touch.  No lip or airway swelling or stridor.  2+ brachial and radial pulses right arm.  Capillary refill < 2 seconds     AKI (acute kidney injury) 12/02/2023   Alcohol abuse 12/02/2023   Alcohol withdrawal (HCC) 02/08/2024   Anemia in chronic illness 01/22/2013   Arcus senilis of both eyes 04/05/2022   Arcus senilis, bilateral     Arthritis, degenerative 01/31/2015   Ascending aortic aneurysm 06/27/2022   Atherosclerosis of coronary artery without angina pectoris 02/19/2024   Benign fibroma of prostate 01/31/2015   Bowel sounds hyperactive 09/13/2022   Hyperactive bowel sounds     BP (high blood pressure) 01/22/2013   BPH (benign prostatic hyperplasia)    CAD (coronary artery disease)    Cannot sleep 01/31/2015   Carcinoma of biliary tract (HCC) 02/15/2013   Diagnosed in 2014, Pt had whipple done at [facility], under care by Dr. Abran Kallman Oncologist, has f/u every 6-12 months.   Cataract  bilateral sx   Cervical osteoarthritis 10/18/2014   Cholangiocarcinoma of biliary tract (HCC)    Chronic cough 01/31/2015   Chronic myelogenous leukemia (HCC)    Chronic pain 01/31/2015   CML (chronic myelocytic leukemia) (HCC) 10/18/2014   Overview:   Follows with [facility], Dr. Cornelius. On dasatinib , last WBC 9.6.     Cognitive  communication disorder 02/19/2024   Compulsive tobacco user syndrome 01/31/2015   Congestive heart failure (CHF) (HCC) 09/26/2023   Constipation 09/13/2022   Constipation, unspecified     COPD (chronic obstructive pulmonary disease) (HCC)    uses inhaler   COPD with asthma (HCC) 01/22/2013   Severe obstruction on spirometry FeV1 54% FeV1/FVC 51% 01/31/2015  CXR NAD 01/25/15     Diuretic-induced hypokalemia 05/23/2023   DJD (degenerative joint disease)    Dyslipidemia    Dyspnea 09/18/2023   Shortness of breath     Edema 10/01/2023   Edema, unspecified     Electrocardiogram abnormal 08/23/2022   Abnormal electrocardiogram [ECG] [EKG]     Encounter for examination of eyes and vision without abnormal findings 08/23/2022   Encounter for exam of eyes and vision w/o abnormal findingsEncounter for exam of eyes and vision w/o abnormal findings; Start Date : 04/05/2022     Family history of colonic polyps    Family history of prostate cancer    Full thickness rotator cuff tear 08/08/2020   Gastroesophageal reflux disease without esophagitis 02/19/2024   Gastrostomy present (HCC) 02/20/2024   Gout    High coronary artery calcium score 05/30/2023   History of colon polyps 02/23/2021   History of colonic polyps 10/01/2021   IMO SNOMED Dx Update Oct 2024     HLD (hyperlipidemia) 01/31/2015   HTN (hypertension)    on meds   Hypercalcemia 08/10/2020   Hypercholesterolemia    Hypocalcemia 08/27/2021   Hypokalemia 12/02/2023   Hyponatremia 09/19/2023   Hypotension 12/02/2023   Impingement syndrome of both shoulders 03/31/2020   Iron  deficiency anemia due to chronic blood loss 03/12/2024   Lung cancer (HCC)    Malnutrition of moderate degree 02/11/2024   Metabolic acidosis 12/02/2023   Mild intermittent asthma 02/03/2024   Muscle weakness 02/20/2024   Myelogenous leukemia (HCC)    Need for assistance with personal care 02/20/2024   Non-small cell carcinoma of lung (HCC) 01/31/2015    02/13/2015 CT Chest no evidence of recurrence in lung cancer  Stage I adenocarcinoma diagnosed with left upper lobectomy in February 2016     OA (osteoarthritis)    Oropharyngeal dysphagia 02/19/2024   Orthostatic hypotension 10/10/2023   Other dietary vitamin B12 deficiency anemia 10/17/2023   Other dietary vitamin B12 deficiency anemiaOther dietary vitamin B12 deficiency anemia; Start Date : 11/01/2024Other dietary vitamin B12 deficiency anemia; Start Date : 09/19/2024Other dietary vitamin B12 deficiency anemia; Start Date : 08/23/2024Other dietary vitamin B12 deficiency anemia; Start Date : 07/01/2024Other dietary vitamin B12 deficiency anemia; Start Date : 06/03/2024Other dieta   Other specified health status 08/23/2022   Other specified health status     PAD (peripheral artery disease)    Personal history of gastric ulcers 02/23/2021   Pleural effusion 10/31/2023   Pleural effusion, not elsewhere classifiedPleural effusion, not elsewhere classified; Start Date : 01/24/2025Pleural effusion, not elsewhere classified; Start Date : 09/18/2023     Pneumonia 09/18/2023   Pneumonia, unspecified organism     Pneumonitis due to inhalation of regurgitated food (HCC) 02/19/2024   Proteinuria 08/23/2022   Proteinuria, unspecified  Reduced mobility 02/20/2024   Renal insufficiency    Right lower lobe pneumonia 02/05/2024   Shock (HCC) 12/02/2023   Shoulder pain, left 03/29/2020   Shoulder pain, right 03/29/2020   Tendonitis    patellar   Thoracic aortic aneurysm 06/27/2022   Tobacco use 08/23/2022   Tobacco use     Type 2 diabetes mellitus with hyperlipidemia (HCC)    Unsteadiness on feet 02/20/2024   Vitamin D  deficiency 02/03/2024   Wheezing 01/31/2015    Past Surgical History:  Procedure Laterality Date   BIOPSY  04/30/2021   Procedure: BIOPSY;  Surgeon: Wilhelmenia Aloha Raddle., MD;  Location: [facility];  Service: Gastroenterology;;   Bowel duct surg  2014   Bowel  cancer   CARDIAC CATHETERIZATION  2000   CHOLECYSTECTOMY     COLONOSCOPY  06/23/2015   mild sigmoid diverticulosis. small external and internal hemorrhoids. otherwise normal colonoscopy to terminal ileum   COLONOSCOPY WITH PROPOFOL  N/A 04/30/2021   Procedure: COLONOSCOPY WITH PROPOFOL ;  Surgeon: Mansouraty, Aloha Raddle., MD;  Location: [facility];  Service: Gastroenterology;  Laterality: N/A;   ENDOSCOPIC MUCOSAL RESECTION N/A 04/30/2021   Procedure: ENDOSCOPIC MUCOSAL RESECTION;  Surgeon: Wilhelmenia Aloha Raddle., MD;  Location: [facility];  Service: Gastroenterology;  Laterality: N/A;   ESOPHAGOGASTRODUODENOSCOPY (EGD) WITH PROPOFOL  N/A 04/30/2021   Procedure: ESOPHAGOGASTRODUODENOSCOPY (EGD) WITH PROPOFOL ;  Surgeon: Wilhelmenia Aloha Raddle., MD;  Location: [facility];  Service: Gastroenterology;  Laterality: N/A;   HEMOSTASIS CLIP PLACEMENT  04/30/2021   Procedure: HEMOSTASIS CLIP PLACEMENT;  Surgeon: Wilhelmenia Aloha Raddle., MD;  Location: [facility];  Service: Gastroenterology;;   IR GASTROSTOMY TUBE MOD SED  02/17/2024   LUNG CANCER SURGERY  09/2014   partial lobectomy   POLYPECTOMY  04/30/2021   Procedure: POLYPECTOMY;  Surgeon: Mansouraty, Aloha Raddle., MD;  Location: [facility];  Service: Gastroenterology;;   SHOULDER OPEN ROTATOR CUFF REPAIR Right 09/27/2020   Procedure: Right shoulder mini open rotator cuff repair, distal clavicle resection;  Surgeon: Duwayne Purchase, MD;  Location: [facility];  Service: Orthopedics;  Laterality: Right;  90 mins  Choice with Block anesthesia   SUBMUCOSAL LIFTING INJECTION  04/30/2021   Procedure: SUBMUCOSAL LIFTING INJECTION;  Surgeon: Wilhelmenia Aloha Raddle., MD;  Location: [facility];  Service: Gastroenterology;;   UPPER GASTROINTESTINAL ENDOSCOPY     WHIPPLE PROCEDURE  02/25/2013   Procedure: WHIPPLE PROCEDURE; Surgeon: Abran Kallman, MD; Location: [facility]; Service: General; Laterality: N/A;    Current Medications: No outpatient  medications have been marked as taking for the 07/22/24 encounter (Appointment) with Ece Cumberland, Kyle SAUNDERS, MD.     Allergies:   Pneumococcal vaccines   Social History   Socioeconomic History   Marital status: Married    Spouse name: Not on file   Number of children: 2   Years of education: Not on file   Highest education level: Not on file  Occupational History   Occupation: environmental  Tobacco Use   Smoking status: Former    Current packs/day: 0.25    Average packs/day: 0.3 packs/day for 40.0 years (10.0 ttl pk-yrs)    Types: Cigarettes   Smokeless tobacco: Never  Vaping Use   Vaping status: Never Used  Substance and Sexual Activity   Alcohol use: Yes    Alcohol/week: 21.0 standard drinks of alcohol    Types: 21 Cans of beer per week   Drug use: Never   Sexual activity: Not on file  Other Topics Concern   Not on file  Social History Narrative  Married, 2 children.   Works moves heavy equipment x 30 yrs.   Social Drivers of Corporate Investment Banker Strain: Not on file  Food Insecurity: No Food Insecurity (02/06/2024)   Hunger Vital Sign    Worried About Running Out of Food in the Last Year: Never true    Ran Out of Food in the Last Year: Never true  Transportation Needs: No Transportation Needs (02/06/2024)   PRAPARE - Administrator, Civil Service (Medical): No    Lack of Transportation (Non-Medical): No  Physical Activity: Not on file  Stress: Not on file  Social Connections: Moderately Isolated (02/06/2024)   Social Connection and Isolation Panel    Frequency of Communication with Friends and Family: More than three times a week    Frequency of Social Gatherings with Friends and Family: Once a week    Attends Religious Services: Never    Database Administrator or Organizations: Yes    Attends Banker Meetings: 1 to 4 times per year    Marital Status: Widowed     Family History: The patient's family history includes Asthma in  his sister; Cancer in his half-sister and paternal grandfather; Hypertension in his brother, mother, and sister; Prostate cancer (age of onset: 35) in his father. There is no history of Esophageal cancer, Colon polyps, Colon cancer, Stomach cancer, Rectal cancer, Inflammatory bowel disease, Liver disease, or Pancreatic cancer. ROS:   Please see the history of present illness.    All 14 point review of systems negative except as described per history of present illness.  EKGs/Labs/Other Studies Reviewed:    The following studies were reviewed today:   EKG:       Recent Labs: 12/02/2023: TSH 4.207 02/06/2024: ALT 23 02/14/2024: Magnesium  2.0 07/06/2024: B Natriuretic Peptide 75.7; BUN 11; Creatinine, Ser 0.71; Hemoglobin 11.5; Platelets 200; Potassium 3.5; Sodium 135  Recent Lipid Panel    Component Value Date/Time   CHOL 165 12/13/2022 0000   TRIG 102 12/13/2022 0000   HDL 109 (A) 12/13/2022 0000   LDLCALC 36 12/13/2022 0000    Physical Exam:    VS:  There were no vitals taken for this visit.    Wt Readings from Last 3 Encounters:  07/06/24 210 lb (95.3 kg)  05/18/24 200 lb (90.7 kg)  02/19/24 192 lb 7.4 oz (87.3 kg)     GENERAL:  Well nourished, well developed in no acute distress NECK: No JVD; No carotid bruits CARDIAC: RRR, S1 and S2 present, no murmurs, no rubs, no gallops CHEST:  Clear to auscultation without rales, wheezing or rhonchi  Extremities: No pitting pedal edema. Pulses bilaterally symmetric with radial 2+ and dorsalis pedis 2+ NEUROLOGIC:  Alert and oriented x 3  Medication Adjustments/Labs and Tests Ordered: Current medicines are reviewed at length with the patient today.  Concerns regarding medicines are outlined above.  No orders of the defined types were placed in this encounter.  No orders of the defined types were placed in this encounter.   Signed, Jmichael Gille reddy Seward Coran, MD, MPH, [facility]. 07/22/2024 8:24 AM    [facility]

## 2024-07-22 NOTE — Telephone Encounter (Signed)
 Patient has been scheduled for follow-up visit per 07/22/24 LOS.  Pt aware of scheduled appt details.

## 2024-07-23 ENCOUNTER — Ambulatory Visit

## 2024-07-26 ENCOUNTER — Inpatient Hospital Stay

## 2024-07-26 ENCOUNTER — Telehealth: Payer: Self-pay | Admitting: Hematology and Oncology

## 2024-07-26 ENCOUNTER — Inpatient Hospital Stay: Attending: Hematology and Oncology | Admitting: Hematology and Oncology

## 2024-07-26 NOTE — Progress Notes (Deleted)
 [name]  [address] [place],  [place]  [address] [phone]  Clinic Day: 07/26/24   Referring physician: Nestor Elston NOVAK, NP  ASSESSMENT & PLAN:  Assessment:   1. 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  He will continue to take dastinib 100 mg daily,  However, his recent chest CT showed bilateral pleural effusions, which are new.  As there is no history of congestive heart failure, the question is are these effusions related to his dasatinib .  He will have a chest x-ray in 3 months.  If his effusions are worse, then a change in CML therapy would need to be considered.  He denies being significantly short of breath at this time.  His CML transcript levels will be reassessed in 3 months   2. History of stage IA non-small cell lung cancer in 2016 treated with surgery alone.  His October, 2024 CT scans showed no evidence of disease recurrence.     3. History of stage IA cholangiocarcinoma in 2014 treated with surgery alone. His October, 2024 CT scans showed no evidence of disease recurrence.     4. B12 deficiency.  He was on B12 injections monthly with his primary care office. He missed his last one and so it has been over a month since his last one and yet his level was good at 667 so I told him he can stop the B-12 injections for now. His B12 level will be rechecked before his next visit.   5. Alcoholic Hepatitis. He knows he needs to quit drinking but is struggling to do so.   Plan: Patient was admitted into the hospital in January, 2025 and was found to have acute congestive heart failure. He was instructed to stop Lasix  and was placed on torsemide  20 mg and recommened follow-up with his cardiologist. He followed up with his cardiologist and received a good report from him. He had a echocardiogram in December, 2024 which was normal. I agreed with stopping Lasix  and will take that off of his med list. I instructed him to continue taking torsemide  20 mg at 3 pills  daily as instructed. He had labs done on 10/21/2023 and he had a WBC of 5.0, hemoglobin of 13.6, and platelet count of 256,000. He had a slightly low sodium of 134, a elevated AST of 78, and his potassium was low-normal at 3.5 improved from 3.4. The rest of his CMP was normal. I instructed him to stay on his oral potassium 2-3 daily and try to increase his fluids. PCR for BCR/ABL was negative. However, his CEA dropped from 7.9 to 3.33 but his CA 19.9 was elevated at 46 up from 36.  He notes not having a B-12 injection for over a month now. His B-12 level was 667 as of January, 2025 and was previously 370. I advised that he stop his B-12 injections and will recheck his levels in the future. I also provided a copy of his labs to bring to his next PCP appointment. I will see him back in 3 months with CBC, CMP, CEA, CA 19.9, and PCR for BCR/ABL done a week before his appointment with me in April. The patient understands the plans discussed today and is in agreement with them.  He knows to contact our office if he develops concerns prior to his next appointment.   I provided 20 minutes of face-to-face time during this encounter and > 50% was spent counseling as documented under my assessment and  plan.   Kyle VEAR Cornish, MD [facility] - A DEPT OF [name] [address] [place] [address] Dept: [phone] Dept Fax: [phone]   No orders of the defined types were placed in this encounter.   CHIEF COMPLAINT:  CC: Chronic myelogenous leukemia with history of lung cancer and cholangiocarcinoma  Current Treatment: Dasatinib  100 mg daily  HISTORY OF PRESENT ILLNESS:  The patient is a 73 year old man with CML which has been kept under ideal control with dasatinib .  He comes in today for routine follow-up, as well as to go his CT scans, which were done to ensure there is no radiographic evidence of disease recurrence, as it pertains to both  his early stage lung and biliary cancers.  Since his last visit, the patient has been doing well.  He denies having any new symptoms or findings which concern him for overt signs of disease recurrence.    Chronic myelogenous leukemia diagnosed in October 2015 when he presented with leukocytosis.  He was originally placed on nilotinib, but developed a severe pruritic rash, so was switched to dasatinib  100 mg daily.  He achieved a major molecular remission by May 2016.   He also has a history of a stage IA cholangiocarcinoma, treated with surgical resection in June 2014.   He then underwent surgical resection of a stage IA (T1 N0 M0) non-small cell lung cancer in February 2016.  Pathology revealed a 3 cm, well-differentiated, adenocarcinoma with negative nodes.  Adjuvant chemotherapy for his lung cancer was not recommended.  He has chronic back pain and continues seeing spine specialist.  He apparently has significant degenerative disc disease.  He has also had upper abdominal pain, which he attributes to dasatinib .  He uses hydrocodone /APAP as needed for his pain.  In May 2018, PCR was positive at 0.012%, consistent with residual CML.  Repeat PCR in August 2018 revealed a major molecular response.  His CML has remained in remission since that time.  CT chest, abdomen and pelvis in November 2019 did not reveal evidence of recurrence of either malignancy.     CT chest in August 2020 did not reveal any evidence of recurrent lung malignancy.  He has had hypocalcemia, so was instructed to take calcium 600 mg twice daily.  When he was seen in November 2020, had been having lower leg pain worsening throughout the day.  He underwent arterial ultrasound/ankle brachial indices, which was normal.  The pain was then felt to possibly be degenerative in nature.  He reported constipation due to the calcium and as his on calcium was normal at that time,  we decreased the calcium to daily.  He had worsening neutropenia and was  found to have B12 and placed on B12 injections weekly for 4 weeks, then monthly.  Serum folate was normal.  He had persistent abdominal pain, so was referred to Dr. Charlanne.  He was seen by Dr. Charlanne in 2021 and had 2 gastric ulcers and H. Pylori infection, which was treated.  He saw Dr. Marda earlier in 2021 as well, and PSA was normal. When he was seen in August, he had recurrent hypocalcemia, with a calcium of 8.3.  He could not tolerate the calcium supplement, so we recommended that he take Tums 4 times daily.  He continued to complain of intermittent abdominal pain, which we have attributed to adhesions.  He has had rotator cuff surgery in January 2022.  He has had  a colonoscopy by Dr. Charlanne with findings of a large polyp.  He will be going back on August 8th to have this resected through colonoscopy.  He missed his appointment in May but Dr. Charlanne told him his labs were good.  I looked up and his PCR for BCR/ABL continues to be negative on the May reading.  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HCC)  02/15/2013 Initial Diagnosis   Carcinoma of biliary tract (HCC)   03/06/2013 Cancer Staging   Staging form: Distal Bile Ducts, AJCC 7th Edition - Clinical stage from 03/06/2013: Stage IA (T1, N0, M0) - Signed by Cornelius Kyle DEL, MD on 11/09/2020 Staged by: Managing physician Diagnostic confirmation: Positive histology Specimen type: Excision Histopathologic type: Cholangiocarcinoma Stage prefix: Initial diagnosis Lymph-vascular invasion (LVI): LVI not present (absent)/not identified Residual tumor (R): R0 - None Stage used in treatment planning: Yes National guidelines used in treatment planning: Yes Type of national guideline used in treatment planning: NCCN   Non-small cell carcinoma of lung (HCC)  11/08/2014 Cancer Staging   Staging form: Lung, AJCC 7th Edition - Clinical stage from 11/08/2014: Stage IA (T1b, N0, M0) - Signed by Cornelius Kyle DEL, MD on 11/09/2020 Staged by:  Managing physician Diagnostic confirmation: Positive histology Specimen type: Excision Histopathologic type: Adenocarcinoma, NOS Stage prefix: Initial diagnosis Laterality: Left Tumor size (mm): 30 Histologic grade (G): G1 Lymph-vascular invasion (LVI): LVI not present (absent)/not identified Residual tumor (R): R0 - None Pleural/elastic layer invasion: PL0 Prognostic indicators: Resection LUL Stage used in treatment planning: Yes National guidelines used in treatment planning: Yes Type of national guideline used in treatment planning: NCCN   01/31/2015 Initial Diagnosis   Non-small cell carcinoma of lung (HCC)      INTERVAL HISTORY:  Kyle Montoya is here for routine follow up for chronic myelogenous leukemia with history of lung cancer and cholangiocarcinoma. Patient states that he feels *** and ***.       He denies fever, chills, night sweats, or other signs of infection. He denies cardiorespiratory and gastrointestinal issues. He  denies pain. His appetite is *** and His weight {Weight change:10426}.   Patient states that he feels well and has no complaints of pain. He endorses not smoking in the last 6 months but continues to drink some alcohol. Patient was admitted into the hospital in January, 2025 and was found to have acute congestive heart failure. He was instructed to stop Lasix  and was placed on torsemide  20 mg and recommened follow-up with his cardiologist. He followed up with his cardiologist and received a good report from him. He had a echocardiogram in December, 2024 which was normal. I agreed with stopping Lasix  and will take that off of his med list. I instructed him to continue taking torsemide  20 mg but 3 pills daily as instructed. He had labs done on 10/21/2023 and he had a WBC of 5.0, hemoglobin of 13.6, and platelet count of 256,000. He had a slightly low sodium of 134, a elevated AST of 78, and his potassium was low-normal at 3.5 improved from 3.4. The rest of his CMP  was normal. I instructed him to stay on his oral potassium 2-3 daily and try to increase his fluids. PCR for BCR/ABL was negative. However, his CEA dropped from 7.9 to 3.33 but his CA 19.9 was elevated at 46 up from 36.  He notes not having a B-12 injection for over a month now. His B-12 level was 667 as of January, 2025 and was previously 370.  I advised that he stop his B-12 injections and will recheck his levels in the future. I also provided a copy of his labs to bring to his next PCP appointment. I will see him back in 3 months with CBC, CMP, CEA, CA 19.9, and PCR for BCR/ABL done a week before his appointment with me in April. He denies signs of infection such as sore throat, sinus drainage, cough, or urinary symptoms.  He denies fevers or recurrent chills. He denies pain. He denies nausea, vomiting, chest pain, dyspnea or cough. His appetite is good and his weight has increased 7 pounds over last 3 weeks.  REVIEW OF SYSTEMS:  Review of Systems  Constitutional: Negative.  Negative for appetite change, chills, diaphoresis, fatigue, fever and unexpected weight change.  HENT:  Negative.  Negative for hearing loss, lump/mass, mouth sores, nosebleeds, sore throat, tinnitus, trouble swallowing and voice change.   Eyes: Negative.  Negative for eye problems and icterus.  Respiratory: Negative.  Negative for chest tightness, cough, hemoptysis, shortness of breath and wheezing.   Cardiovascular: Negative.  Negative for chest pain, leg swelling and palpitations.  Gastrointestinal: Negative.  Negative for abdominal distention, abdominal pain, blood in stool, constipation, diarrhea, nausea, rectal pain and vomiting.  Endocrine: Negative.   Genitourinary: Negative.  Negative for bladder incontinence, difficulty urinating, dyspareunia, dysuria, frequency, hematuria, nocturia, pelvic pain and penile discharge.   Musculoskeletal: Negative.  Negative for arthralgias, back pain, flank pain, gait problem, myalgias,  neck pain and neck stiffness.  Skin: Negative.  Negative for itching, rash and wound.  Neurological: Negative.  Negative for dizziness, extremity weakness, gait problem, headaches, light-headedness, numbness, seizures and speech difficulty.  Hematological: Negative.  Negative for adenopathy. Does not bruise/bleed easily.  Psychiatric/Behavioral: Negative.  Negative for confusion, decreased concentration, depression, sleep disturbance and suicidal ideas. The patient is not nervous/anxious.   All other systems reviewed and are negative.    VITALS:  There were no vitals taken for this visit.  Wt Readings from Last 3 Encounters:  07/06/24 210 lb (95.3 kg)  05/18/24 200 lb (90.7 kg)  02/19/24 192 lb 7.4 oz (87.3 kg)    There is no height or weight on file to calculate BMI.  Performance status (ECOG): 0 - Asymptomatic  PHYSICAL EXAM:  Physical Exam Vitals and nursing note reviewed.  Constitutional:      General: He is not in acute distress.    Appearance: Normal appearance. He is normal weight. He is not ill-appearing, toxic-appearing or diaphoretic.  HENT:     Head: Normocephalic and atraumatic.     Right Ear: Tympanic membrane, ear canal and external ear normal. There is no impacted cerumen.     Left Ear: Tympanic membrane, ear canal and external ear normal. There is no impacted cerumen.     Nose: Nose normal. No congestion or rhinorrhea.     Mouth/Throat:     Mouth: Mucous membranes are moist.     Pharynx: Oropharynx is clear. No oropharyngeal exudate or posterior oropharyngeal erythema.  Eyes:     General: No scleral icterus.       Right eye: No discharge.        Left eye: No discharge.     Extraocular Movements: Extraocular movements intact.     Conjunctiva/sclera: Conjunctivae normal.     Pupils: Pupils are equal, round, and reactive to light.  Neck:     Vascular: No carotid bruit.  Cardiovascular:     Rate and Rhythm: Normal rate and regular rhythm.  Pulses: Normal  pulses.     Heart sounds: Normal heart sounds. No murmur heard.    No friction rub. No gallop.  Pulmonary:     Effort: Pulmonary effort is normal. No respiratory distress.     Breath sounds: Normal breath sounds. No stridor. No wheezing, rhonchi or rales.  Chest:     Chest wall: No tenderness.  Abdominal:     General: Bowel sounds are normal. There is no distension.     Palpations: Abdomen is soft. There is no hepatomegaly, splenomegaly or mass.     Tenderness: There is abdominal tenderness in the right upper quadrant. There is no right CVA tenderness, left CVA tenderness, guarding or rebound.     Hernia: No hernia is present.     Comments: Tenderness in the right upper quadrant Mild hepatomegaly  Musculoskeletal:        General: No swelling, tenderness, deformity or signs of injury. Normal range of motion.     Cervical back: Normal range of motion and neck supple. No rigidity or tenderness.     Right lower leg: No edema.     Left lower leg: No edema.  Lymphadenopathy:     Cervical: No cervical adenopathy.  Skin:    General: Skin is warm and dry.     Coloration: Skin is not jaundiced or pale.     Findings: No bruising, erythema, lesion or rash.  Neurological:     General: No focal deficit present.     Mental Status: He is alert and oriented to person, place, and time. Mental status is at baseline.     Cranial Nerves: No cranial nerve deficit.     Sensory: No sensory deficit.     Motor: No weakness.     Coordination: Coordination normal.     Gait: Gait normal.     Deep Tendon Reflexes: Reflexes normal.  Psychiatric:        Mood and Affect: Mood normal.        Behavior: Behavior normal.        Thought Content: Thought content normal.        Judgment: Judgment normal.    LABS:      Latest Ref Rng & Units 07/06/2024    4:55 AM 02/18/2024    2:51 AM 02/17/2024    2:28 AM  CBC  WBC 4.0 - 10.5 K/uL 4.6  4.2  3.9   Hemoglobin 13.0 - 17.0 g/dL 88.4  7.7  7.0   Hematocrit 39.0  - 52.0 % 35.9  23.7  21.5   Platelets 150 - 400 K/uL 200  290  242       Latest Ref Rng & Units 07/06/2024    4:55 AM 02/19/2024    5:39 AM 02/18/2024    2:51 AM  CMP  Glucose 70 - 99 mg/dL 78  880  883   BUN 8 - 23 mg/dL 11  9  9    Creatinine 0.61 - 1.24 mg/dL 9.28  9.42  9.39   Sodium 135 - 145 mmol/L 135  132  133   Potassium 3.5 - 5.1 mmol/L 3.5  4.2  4.0   Chloride 98 - 111 mmol/L 103  104  106   CO2 22 - 32 mmol/L 20  24  23    Calcium 8.9 - 10.3 mg/dL 7.7  7.6  7.7    Lab Results  Component Value Date   CEA1 7.9 (H) 07/17/2023   CEA 1.11 01/20/2024   /  CEA  Date Value Ref Range Status  07/17/2023 7.9 (H) 0.0 - 4.7 ng/mL Final    Comment:    (NOTE)                             Nonsmokers          <3.9                             Smokers             <5.6 Roche [employer]ostics Electrochemiluminescence Immunoassay (ECLIA) Values obtained with different assay methods or kits cannot be used interchangeably.  Results cannot be interpreted as absolute evidence of the presence or absence of malignant disease. Performed At: [facility] [address] [place], [place] [id number] Jennette Shorter MD Ey:[phone]    CEA ([facility])  Date Value Ref Range Status  01/20/2024 1.11 0.00 - 5.00 ng/mL Final    Comment:    (NOTE) This test was performed using [employer] Coul[employer]'s paramagnetic chemiluminescent immunoassay. Values obtained from different assay methods cannot be used interchangeably. Please note that up to 8% of patients who smoke may see values 5.1-10.0 ng/ml and 1% of patients who smoke may see CEA levels >10.0 ng/ml. Performed at [facility], [address] [place], [place] [address]    Lab Results  Component Value Date   [id number] 01/20/2024   STUDIES:  EXAM: 02/16/2024 CT ABDOMEN WITHOUT CONTRAST IMPRESSION: 1. Large bilateral pleural effusions. 2. Small pericardial effusion. 3. No acute abdominal pathology identified. 4. Aortic  atherosclerosis (ICD10-I70.0).  EXAM: 10/21/2023 CHEST - 2 VIEW IMPRESSION: Stable small right pleural effusion and mild right basilar atelectasis. No new or progressive disease.   HISTORY:   Allergies:  Allergies  Allergen Reactions   Pneumococcal Vaccines Swelling    Current Medications: Current Outpatient Medications  Medication Sig Dispense Refill   albuterol  (PROVENTIL  HFA;VENTOLIN  HFA) 108 (90 BASE) MCG/ACT inhaler Inhale 2 puffs into the lungs every 6 (six) hours as needed for wheezing or shortness of breath.     allopurinol  (ZYLOPRIM ) 300 MG tablet Place 1 tablet (300 mg total) into feeding tube daily.     ascorbic acid (VITAMIN C) 500 MG tablet Take 500 mg by mouth daily.     aspirin  EC 81 MG tablet Take 1 tablet (81 mg total) by mouth daily. Swallow whole.     Calcium Carb-Cholecalciferol (CALCIUM 600+D3) 600-5 MG-MCG TABS Take 2 tablets by mouth daily.     Cholecalciferol (VITAMIN D ) 50 MCG (2000 UT) tablet Place 1 tablet (2,000 Units total) into feeding tube daily.     cyclobenzaprine (FLEXERIL) 5 MG tablet Take 5 mg by mouth daily.     dasatinib  (SPRYCEL ) 100 MG tablet Place 1 tablet (100 mg total) into feeding tube daily.     diclofenac Sodium (VOLTAREN) 1 % GEL Apply 2 g topically at bedtime as needed (Pain).     famotidine  (PEPCID ) 40 MG tablet Place 1 tablet (40 mg total) into feeding tube daily.     Ferrous Sulfate 28 MG TABS Take 1 tablet by mouth daily.     fluticasone  (FLONASE ) 50 MCG/ACT nasal spray Place 2 sprays into both nostrils daily.     folic acid  (FOLVITE ) 1 MG tablet Place 1 tablet (1 mg total) into feeding tube daily.     HYDROcodone -acetaminophen  (NORCO/VICODIN) 5-325 MG tablet Place 1 tablet into feeding tube every  6 (six) hours as needed for moderate pain (pain score 4-6). 15 tablet 0   irbesartan -hydrochlorothiazide  (AVALIDE) 150-12.5 MG tablet TAKE 1 TAB(S) ORALLY ONCE A DAY FOR 30 DAY(S) IF BP IS HIGHER THAN 160/90 THAN TAKE A 2ND TABLET      methocarbamol  (ROBAXIN ) 750 MG tablet Place 1 tablet (750 mg total) into feeding tube every 6 (six) hours as needed for muscle spasms.     midodrine  (PROAMATINE ) 5 MG tablet Place 2 tablets (10 mg total) into feeding tube 2 (two) times daily with a meal.     Multiple Vitamin (MULTIVITAMIN WITH MINERALS) TABS tablet Place 1 tablet into feeding tube daily.     Nutritional Supplements (FEEDING SUPPLEMENT, OSMOLITE 1.5 CAL,) LIQD Place 1,000 mLs into feeding tube continuous. At  55ml/hr     polyethylene glycol (MIRALAX  / GLYCOLAX ) 17 g packet Place 17 g into feeding tube daily as needed for moderate constipation.     potassium chloride  SA (KLOR-CON  M) 20 MEQ tablet Take 20 mEq by mouth 2 (two) times daily.     Protein (FEEDING SUPPLEMENT, PROSOURCE TF20,) liquid Place 60 mLs into feeding tube daily.     tamsulosin  (FLOMAX ) 0.4 MG CAPS capsule Take 2 capsules (0.8 mg total) by mouth daily.     torsemide  (DEMADEX ) 20 MG tablet Take 20 mg by mouth daily.     TRELEGY ELLIPTA 100-62.5-25 MCG/INH AEPB Inhale 1 puff into the lungs daily at 6 (six) AM.     umeclidinium bromide  (INCRUSE ELLIPTA ) 62.5 MCG/ACT AEPB Inhale 1 puff into the lungs daily.     Water  For Irrigation, Sterile (FREE WATER ) SOLN Place 200 mLs into feeding tube every 8 (eight) hours.     No current facility-administered medications for this visit.    I,Jasmine M Lassiter,acting as a neurosurgeon for [employer], NP.,have documented all relevant documentation on the behalf of Eleanor DELENA Bach, NP,as directed by  Eleanor DELENA Bach, NP while in the presence of Eleanor DELENA Bach, NP.  I have reviewed this report as typed by the medical scribe, and it is complete and accurate.

## 2024-07-26 NOTE — Telephone Encounter (Signed)
 07/26/24 Spoke with [facility].Informed them that patient did not show for an appt.Patient was called 1hr before appt as a reminder.

## 2024-07-29 ENCOUNTER — Ambulatory Visit: Admitting: Gastroenterology

## 2024-08-03 ENCOUNTER — Inpatient Hospital Stay

## 2024-08-03 ENCOUNTER — Inpatient Hospital Stay: Admitting: Oncology

## 2024-08-04 ENCOUNTER — Encounter: Payer: Self-pay | Admitting: Physician Assistant

## 2024-08-06 ENCOUNTER — Encounter (HOSPITAL_COMMUNITY): Payer: Self-pay

## 2024-08-06 ENCOUNTER — Telehealth (HOSPITAL_COMMUNITY): Payer: Self-pay | Admitting: Nurse Practitioner

## 2024-08-06 ENCOUNTER — Ambulatory Visit (HOSPITAL_COMMUNITY): Admission: RE | Admit: 2024-08-06 | Source: Ambulatory Visit

## 2024-08-06 ENCOUNTER — Ambulatory Visit (HOSPITAL_COMMUNITY)

## 2024-08-06 NOTE — Telephone Encounter (Signed)
 Patient no showed for the 5th time. As verbally explained to [facility] at time appt. was made- this was pt's finally opportunity to have op mbss done at [facility]. If test is still needed, pt will need to have swallow test performed elsewhere. (AHARRIS)

## 2024-08-08 ENCOUNTER — Inpatient Hospital Stay (HOSPITAL_COMMUNITY)

## 2024-08-08 ENCOUNTER — Encounter (HOSPITAL_COMMUNITY): Payer: Self-pay

## 2024-08-08 ENCOUNTER — Emergency Department (HOSPITAL_COMMUNITY)

## 2024-08-08 ENCOUNTER — Inpatient Hospital Stay (HOSPITAL_COMMUNITY)
Admission: EM | Admit: 2024-08-08 | Discharge: 2024-08-13 | DRG: 291 | Disposition: A | Discharge status: Home Health | Attending: Family Medicine | Admitting: Family Medicine

## 2024-08-08 ENCOUNTER — Other Ambulatory Visit: Payer: Self-pay

## 2024-08-08 DIAGNOSIS — K660 Peritoneal adhesions (postprocedural) (postinfection): Secondary | ICD-10-CM | POA: Diagnosis present

## 2024-08-08 DIAGNOSIS — E278 Other specified disorders of adrenal gland: Secondary | ICD-10-CM | POA: Diagnosis present

## 2024-08-08 DIAGNOSIS — R918 Other nonspecific abnormal finding of lung field: Secondary | ICD-10-CM | POA: Diagnosis not present

## 2024-08-08 DIAGNOSIS — Z83719 Family history of colon polyps, unspecified: Secondary | ICD-10-CM

## 2024-08-08 DIAGNOSIS — I110 Hypertensive heart disease with heart failure: Secondary | ICD-10-CM | POA: Diagnosis present

## 2024-08-08 DIAGNOSIS — K760 Fatty (change of) liver, not elsewhere classified: Secondary | ICD-10-CM | POA: Diagnosis present

## 2024-08-08 DIAGNOSIS — Z981 Arthrodesis status: Secondary | ICD-10-CM

## 2024-08-08 DIAGNOSIS — R748 Abnormal levels of other serum enzymes: Secondary | ICD-10-CM | POA: Diagnosis present

## 2024-08-08 DIAGNOSIS — I517 Cardiomegaly: Secondary | ICD-10-CM | POA: Diagnosis not present

## 2024-08-08 DIAGNOSIS — K831 Obstruction of bile duct: Secondary | ICD-10-CM | POA: Diagnosis present

## 2024-08-08 DIAGNOSIS — R911 Solitary pulmonary nodule: Secondary | ICD-10-CM | POA: Diagnosis present

## 2024-08-08 DIAGNOSIS — M1990 Unspecified osteoarthritis, unspecified site: Secondary | ICD-10-CM | POA: Diagnosis present

## 2024-08-08 DIAGNOSIS — Z604 Social exclusion and rejection: Secondary | ICD-10-CM | POA: Diagnosis present

## 2024-08-08 DIAGNOSIS — R4189 Other symptoms and signs involving cognitive functions and awareness: Secondary | ICD-10-CM | POA: Diagnosis present

## 2024-08-08 DIAGNOSIS — E440 Moderate protein-calorie malnutrition: Secondary | ICD-10-CM | POA: Diagnosis present

## 2024-08-08 DIAGNOSIS — J441 Chronic obstructive pulmonary disease with (acute) exacerbation: Secondary | ICD-10-CM | POA: Diagnosis present

## 2024-08-08 DIAGNOSIS — Z87891 Personal history of nicotine dependence: Secondary | ICD-10-CM | POA: Diagnosis not present

## 2024-08-08 DIAGNOSIS — R052 Subacute cough: Secondary | ICD-10-CM

## 2024-08-08 DIAGNOSIS — R188 Other ascites: Secondary | ICD-10-CM | POA: Diagnosis present

## 2024-08-08 DIAGNOSIS — J45901 Unspecified asthma with (acute) exacerbation: Secondary | ICD-10-CM | POA: Diagnosis not present

## 2024-08-08 DIAGNOSIS — N400 Enlarged prostate without lower urinary tract symptoms: Secondary | ICD-10-CM | POA: Diagnosis present

## 2024-08-08 DIAGNOSIS — R053 Chronic cough: Secondary | ICD-10-CM

## 2024-08-08 DIAGNOSIS — T502X6A Underdosing of carbonic-anhydrase inhibitors, benzothiadiazides and other diuretics, initial encounter: Secondary | ICD-10-CM | POA: Diagnosis present

## 2024-08-08 DIAGNOSIS — Z806 Family history of leukemia: Secondary | ICD-10-CM

## 2024-08-08 DIAGNOSIS — C9210 Chronic myeloid leukemia, BCR/ABL-positive, not having achieved remission: Secondary | ICD-10-CM | POA: Diagnosis present

## 2024-08-08 DIAGNOSIS — Z8509 Personal history of malignant neoplasm of other digestive organs: Secondary | ICD-10-CM

## 2024-08-08 DIAGNOSIS — C3490 Malignant neoplasm of unspecified part of unspecified bronchus or lung: Secondary | ICD-10-CM | POA: Diagnosis not present

## 2024-08-08 DIAGNOSIS — Z1152 Encounter for screening for COVID-19: Secondary | ICD-10-CM | POA: Diagnosis not present

## 2024-08-08 DIAGNOSIS — Z91148 Patient's other noncompliance with medication regimen for other reason: Secondary | ICD-10-CM

## 2024-08-08 DIAGNOSIS — D638 Anemia in other chronic diseases classified elsewhere: Secondary | ICD-10-CM | POA: Diagnosis present

## 2024-08-08 DIAGNOSIS — I959 Hypotension, unspecified: Secondary | ICD-10-CM | POA: Diagnosis present

## 2024-08-08 DIAGNOSIS — I5033 Acute on chronic diastolic (congestive) heart failure: Secondary | ICD-10-CM | POA: Diagnosis present

## 2024-08-08 DIAGNOSIS — E119 Type 2 diabetes mellitus without complications: Secondary | ICD-10-CM | POA: Diagnosis present

## 2024-08-08 DIAGNOSIS — R609 Edema, unspecified: Secondary | ICD-10-CM | POA: Diagnosis not present

## 2024-08-08 DIAGNOSIS — Z789 Other specified health status: Secondary | ICD-10-CM | POA: Diagnosis not present

## 2024-08-08 DIAGNOSIS — R17 Unspecified jaundice: Secondary | ICD-10-CM | POA: Diagnosis not present

## 2024-08-08 DIAGNOSIS — R600 Localized edema: Secondary | ICD-10-CM | POA: Diagnosis present

## 2024-08-08 DIAGNOSIS — Z887 Allergy status to serum and vaccine status: Secondary | ICD-10-CM

## 2024-08-08 DIAGNOSIS — F1090 Alcohol use, unspecified, uncomplicated: Secondary | ICD-10-CM | POA: Diagnosis present

## 2024-08-08 DIAGNOSIS — R7401 Elevation of levels of liver transaminase levels: Secondary | ICD-10-CM | POA: Diagnosis present

## 2024-08-08 DIAGNOSIS — Z79899 Other long term (current) drug therapy: Secondary | ICD-10-CM

## 2024-08-08 DIAGNOSIS — Z8249 Family history of ischemic heart disease and other diseases of the circulatory system: Secondary | ICD-10-CM

## 2024-08-08 DIAGNOSIS — Z902 Acquired absence of lung [part of]: Secondary | ICD-10-CM

## 2024-08-08 DIAGNOSIS — R0600 Dyspnea, unspecified: Secondary | ICD-10-CM | POA: Diagnosis present

## 2024-08-08 DIAGNOSIS — J90 Pleural effusion, not elsewhere classified: Secondary | ICD-10-CM | POA: Diagnosis not present

## 2024-08-08 DIAGNOSIS — J4541 Moderate persistent asthma with (acute) exacerbation: Principal | ICD-10-CM | POA: Diagnosis present

## 2024-08-08 DIAGNOSIS — I509 Heart failure, unspecified: Secondary | ICD-10-CM

## 2024-08-08 DIAGNOSIS — J918 Pleural effusion in other conditions classified elsewhere: Secondary | ICD-10-CM | POA: Diagnosis present

## 2024-08-08 DIAGNOSIS — F0390 Unspecified dementia without behavioral disturbance: Secondary | ICD-10-CM | POA: Diagnosis present

## 2024-08-08 DIAGNOSIS — R059 Cough, unspecified: Secondary | ICD-10-CM

## 2024-08-08 DIAGNOSIS — M2578 Osteophyte, vertebrae: Secondary | ICD-10-CM | POA: Diagnosis present

## 2024-08-08 DIAGNOSIS — Z8042 Family history of malignant neoplasm of prostate: Secondary | ICD-10-CM

## 2024-08-08 DIAGNOSIS — R1310 Dysphagia, unspecified: Secondary | ICD-10-CM | POA: Diagnosis not present

## 2024-08-08 DIAGNOSIS — R1312 Dysphagia, oropharyngeal phase: Secondary | ICD-10-CM | POA: Diagnosis present

## 2024-08-08 DIAGNOSIS — J9811 Atelectasis: Secondary | ICD-10-CM | POA: Diagnosis present

## 2024-08-08 DIAGNOSIS — Z7982 Long term (current) use of aspirin: Secondary | ICD-10-CM

## 2024-08-08 DIAGNOSIS — R0609 Other forms of dyspnea: Secondary | ICD-10-CM | POA: Diagnosis not present

## 2024-08-08 DIAGNOSIS — I7121 Aneurysm of the ascending aorta, without rupture: Secondary | ICD-10-CM | POA: Diagnosis present

## 2024-08-08 DIAGNOSIS — I3139 Other pericardial effusion (noninflammatory): Secondary | ICD-10-CM | POA: Diagnosis present

## 2024-08-08 DIAGNOSIS — Z66 Do not resuscitate: Secondary | ICD-10-CM | POA: Diagnosis present

## 2024-08-08 DIAGNOSIS — K219 Gastro-esophageal reflux disease without esophagitis: Secondary | ICD-10-CM | POA: Diagnosis present

## 2024-08-08 DIAGNOSIS — E871 Hypo-osmolality and hyponatremia: Secondary | ICD-10-CM | POA: Diagnosis present

## 2024-08-08 DIAGNOSIS — I2510 Atherosclerotic heart disease of native coronary artery without angina pectoris: Secondary | ICD-10-CM | POA: Diagnosis present

## 2024-08-08 DIAGNOSIS — R0689 Other abnormalities of breathing: Secondary | ICD-10-CM | POA: Diagnosis not present

## 2024-08-08 DIAGNOSIS — Z8711 Personal history of peptic ulcer disease: Secondary | ICD-10-CM

## 2024-08-08 DIAGNOSIS — Z9221 Personal history of antineoplastic chemotherapy: Secondary | ICD-10-CM

## 2024-08-08 DIAGNOSIS — Z85118 Personal history of other malignant neoplasm of bronchus and lung: Secondary | ICD-10-CM

## 2024-08-08 DIAGNOSIS — Z9049 Acquired absence of other specified parts of digestive tract: Secondary | ICD-10-CM

## 2024-08-08 DIAGNOSIS — R41841 Cognitive communication deficit: Secondary | ICD-10-CM

## 2024-08-08 DIAGNOSIS — F1010 Alcohol abuse, uncomplicated: Secondary | ICD-10-CM | POA: Diagnosis present

## 2024-08-08 DIAGNOSIS — Z825 Family history of asthma and other chronic lower respiratory diseases: Secondary | ICD-10-CM

## 2024-08-08 DIAGNOSIS — E1151 Type 2 diabetes mellitus with diabetic peripheral angiopathy without gangrene: Secondary | ICD-10-CM | POA: Diagnosis present

## 2024-08-08 DIAGNOSIS — Z9889 Other specified postprocedural states: Secondary | ICD-10-CM

## 2024-08-08 DIAGNOSIS — I5023 Acute on chronic systolic (congestive) heart failure: Secondary | ICD-10-CM | POA: Diagnosis present

## 2024-08-08 DIAGNOSIS — I351 Nonrheumatic aortic (valve) insufficiency: Secondary | ICD-10-CM | POA: Diagnosis present

## 2024-08-08 DIAGNOSIS — Z8701 Personal history of pneumonia (recurrent): Secondary | ICD-10-CM

## 2024-08-08 DIAGNOSIS — Z8505 Personal history of malignant neoplasm of liver: Secondary | ICD-10-CM

## 2024-08-08 DIAGNOSIS — Z6830 Body mass index (BMI) 30.0-30.9, adult: Secondary | ICD-10-CM | POA: Diagnosis not present

## 2024-08-08 DIAGNOSIS — R413 Other amnesia: Secondary | ICD-10-CM | POA: Diagnosis present

## 2024-08-08 DIAGNOSIS — K5900 Constipation, unspecified: Secondary | ICD-10-CM | POA: Diagnosis present

## 2024-08-08 DIAGNOSIS — Z8719 Personal history of other diseases of the digestive system: Secondary | ICD-10-CM

## 2024-08-08 DIAGNOSIS — Z931 Gastrostomy status: Secondary | ICD-10-CM | POA: Diagnosis not present

## 2024-08-08 DIAGNOSIS — J449 Chronic obstructive pulmonary disease, unspecified: Secondary | ICD-10-CM | POA: Diagnosis present

## 2024-08-08 DIAGNOSIS — Z860101 Personal history of adenomatous and serrated colon polyps: Secondary | ICD-10-CM

## 2024-08-08 DIAGNOSIS — Z7951 Long term (current) use of inhaled steroids: Secondary | ICD-10-CM

## 2024-08-08 DIAGNOSIS — I4510 Unspecified right bundle-branch block: Secondary | ICD-10-CM | POA: Diagnosis present

## 2024-08-08 LAB — BASIC METABOLIC PANEL WITH GFR
Anion gap: 12 (ref 5–15)
BUN: 9 mg/dL (ref 8–23)
CO2: 19 mmol/L — ABNORMAL LOW (ref 22–32)
Calcium: 7.5 mg/dL — ABNORMAL LOW (ref 8.9–10.3)
Chloride: 97 mmol/L — ABNORMAL LOW (ref 98–111)
Creatinine, Ser: 0.71 mg/dL (ref 0.61–1.24)
GFR, Estimated: >60 mL/min (ref >60)
Glucose, Bld: 119 mg/dL — ABNORMAL HIGH (ref 70–99)
Potassium: 3.5 mmol/L (ref 3.5–5.1)
Sodium: 128 mmol/L — ABNORMAL LOW (ref 135–145)

## 2024-08-08 LAB — CBC
HCT: 31.9 % — ABNORMAL LOW (ref 39.0–52.0)
Hemoglobin: 10.9 g/dL — ABNORMAL LOW (ref 13.0–17.0)
MCH: 29.8 pg (ref 26.0–34.0)
MCHC: 34.2 g/dL (ref 30.0–36.0)
MCV: 87.2 fL (ref 80.0–100.0)
Platelets: 102 K/uL — ABNORMAL LOW (ref 150–400)
RBC: 3.66 MIL/uL — ABNORMAL LOW (ref 4.22–5.81)
RDW: 18.6 % — ABNORMAL HIGH (ref 11.5–15.5)
WBC: 7.4 K/uL (ref 4.0–10.5)
nRBC: 0 % (ref 0.0–0.2)

## 2024-08-08 LAB — RESP PANEL BY RT-PCR (RSV, FLU A&B, COVID)  RVPGX2
Influenza A by PCR: NEGATIVE
Influenza B by PCR: NEGATIVE
Resp Syncytial Virus by PCR: NEGATIVE
SARS Coronavirus 2 by RT PCR: NEGATIVE

## 2024-08-08 LAB — TROPONIN I (HIGH SENSITIVITY)
Troponin I (High Sensitivity): 28 ng/L — ABNORMAL HIGH (ref <18)
Troponin I (High Sensitivity): 25 ng/L — ABNORMAL HIGH (ref <18)

## 2024-08-08 LAB — BRAIN NATRIURETIC PEPTIDE: B Natriuretic Peptide: 203.2 pg/mL — ABNORMAL HIGH (ref 0.0–100.0)

## 2024-08-08 MED ORDER — BUDESON-GLYCOPYRROL-FORMOTEROL 160-9-4.8 MCG/ACT IN AERO
2.0000 | INHALATION_SPRAY | Freq: Two times a day (BID) | RESPIRATORY_TRACT | Status: DC
  Administered 2024-08-08 – 2024-08-09 (×2): 2 via RESPIRATORY_TRACT
  Filled 2024-08-08: qty 5.9

## 2024-08-08 MED ORDER — AZITHROMYCIN 250 MG PO TABS
500.0000 mg | ORAL_TABLET | Freq: Every day | ORAL | Status: AC
  Administered 2024-08-08 – 2024-08-10 (×3): 500 mg via ORAL
  Filled 2024-08-08 (×3): qty 2

## 2024-08-08 MED ORDER — IOHEXOL 350 MG/ML SOLN: 75.0000 mL | Freq: Once | INTRAVENOUS | Status: DC | PRN

## 2024-08-08 MED ORDER — ENOXAPARIN SODIUM 40 MG/0.4ML IJ SOSY
40.0000 mg | PREFILLED_SYRINGE | INTRAMUSCULAR | Status: DC
  Administered 2024-08-08 – 2024-08-12 (×5): 40 mg via SUBCUTANEOUS
  Filled 2024-08-08 (×5): qty 0.4

## 2024-08-08 MED ORDER — IPRATROPIUM-ALBUTEROL 0.5-2.5 (3) MG/3ML IN SOLN: 3.0000 mL | RESPIRATORY_TRACT | Status: DC | PRN

## 2024-08-08 MED ORDER — HYDROCODONE-ACETAMINOPHEN 5-325 MG PO TABS
1.0000 | ORAL_TABLET | Freq: Four times a day (QID) | ORAL | Status: DC | PRN
  Administered 2024-08-08 – 2024-08-10 (×4): 1 via ORAL
  Filled 2024-08-08 (×4): qty 1

## 2024-08-08 MED ORDER — METHYLPREDNISOLONE SODIUM SUCC 125 MG IJ SOLR
125.0000 mg | Freq: Once | INTRAMUSCULAR | Status: AC
  Administered 2024-08-08: 125 mg via INTRAVENOUS
  Filled 2024-08-08: qty 2

## 2024-08-08 MED ORDER — ADULT MULTIVITAMIN W/MINERALS CH
1.0000 | ORAL_TABLET | Freq: Every day | ORAL | Status: DC
  Administered 2024-08-08 – 2024-08-13 (×6): 1 via ORAL
  Filled 2024-08-08 (×6): qty 1

## 2024-08-08 MED ORDER — IOHEXOL 350 MG/ML SOLN
75.0000 mL | Freq: Once | INTRAVENOUS | Status: AC | PRN
  Administered 2024-08-08: 75 mL via INTRAVENOUS

## 2024-08-08 MED ORDER — FOLIC ACID 1 MG PO TABS
1.0000 mg | ORAL_TABLET | Freq: Every day | ORAL | Status: DC
  Administered 2024-08-08 – 2024-08-13 (×6): 1 mg via ORAL
  Filled 2024-08-08 (×6): qty 1

## 2024-08-08 MED ORDER — IPRATROPIUM-ALBUTEROL 0.5-2.5 (3) MG/3ML IN SOLN
3.0000 mL | Freq: Once | RESPIRATORY_TRACT | Status: AC
  Administered 2024-08-08: 3 mL via RESPIRATORY_TRACT
  Filled 2024-08-08: qty 3

## 2024-08-08 MED ORDER — THIAMINE HCL 100 MG/ML IJ SOLN
100.0000 mg | Freq: Every day | INTRAMUSCULAR | Status: DC
  Filled 2024-08-08 (×4): qty 2

## 2024-08-08 MED ORDER — BENZONATATE 100 MG PO CAPS
100.0000 mg | ORAL_CAPSULE | Freq: Two times a day (BID) | ORAL | Status: DC
  Administered 2024-08-08: 100 mg via ORAL
  Filled 2024-08-08: qty 1

## 2024-08-08 MED ORDER — FUROSEMIDE 10 MG/ML IJ SOLN
60.0000 mg | Freq: Once | INTRAMUSCULAR | Status: AC
  Administered 2024-08-08: 60 mg via INTRAVENOUS
  Filled 2024-08-08: qty 6

## 2024-08-08 MED ORDER — IPRATROPIUM-ALBUTEROL 0.5-2.5 (3) MG/3ML IN SOLN
3.0000 mL | RESPIRATORY_TRACT | Status: DC
Start: 2024-08-08 — End: 2024-08-09
  Administered 2024-08-08 – 2024-08-09 (×4): 3 mL via RESPIRATORY_TRACT
  Filled 2024-08-08 (×3): qty 3

## 2024-08-08 MED ORDER — ACETAMINOPHEN 325 MG PO TABS
650.0000 mg | ORAL_TABLET | Freq: Four times a day (QID) | ORAL | Status: DC | PRN
  Administered 2024-08-08: 650 mg via ORAL
  Filled 2024-08-08: qty 2

## 2024-08-08 MED ORDER — THIAMINE MONONITRATE 100 MG PO TABS
100.0000 mg | ORAL_TABLET | Freq: Every day | ORAL | Status: DC
  Administered 2024-08-08 – 2024-08-13 (×6): 100 mg via ORAL
  Filled 2024-08-08 (×6): qty 1

## 2024-08-08 MED ORDER — PREDNISONE 20 MG PO TABS
40.0000 mg | ORAL_TABLET | Freq: Every day | ORAL | Status: AC
  Administered 2024-08-09 – 2024-08-12 (×4): 40 mg via ORAL
  Filled 2024-08-08 (×4): qty 2

## 2024-08-08 NOTE — Assessment & Plan Note (Signed)
 R>L lower extremity edema.  Given robust cancer history, will rule out clot. - Bilateral DVT US 

## 2024-08-08 NOTE — ED Triage Notes (Signed)
 Pt requesting his feeding tube removed, pt states it has been there about 2 months and he has never used it. Pt eating and drinking orally without difficulty.  Pt also c.o dry cough, sob and leg swelling x 1 week. Pt noncompliant with diuretic. Pt denies abd pain or tenderness, redness noted to RLQ.

## 2024-08-08 NOTE — ED Notes (Signed)
 Pt short of breath after ambulating but SpO2 were at 100% the whole time ambulating

## 2024-08-08 NOTE — Hospital Course (Addendum)
 Kyle Montoya is a 73 y.o. year old with a history of CHF(?), COPD, EtOH abuse, hx dysphagia s/p PEG (01/2024), history of multiple cancers (lung cancer sp resection, CML, cholangiocarcinoma), HTN, ascending aortic aneurysm, IDA who presented with cough and dyspnea and was admitted to the [facility] Medicine Teaching Service for COPD exacerbation.  Dyspnea, cough COPD exacerbation Acute decompensated heart failure Patient presented with 2 weeks of coughing and worsening dyspnea in addition to bilateral leg and abdomen edema.  Worked up for aspiration pneumonia given history of dysphagia with abnormal barium swallow in 01/2024; also worked up for PE in setting of robust history of malignancy with uneven lower extremity edema and Wells score of 4.  CT PE done 11/16 showed no PE; LLL pulmonary nodule. CTAP obtained given abdominal edema in setting of history of biliary cancer; done 11/16 without significant findings.  Questionable history of CHF with possible grade 1 diastolic dysfunction on echo done 03/2024; repeat echo this admission done 11/17 showed LVEF 55-60%, mild LVH, normal diastolic parameters, severely enlarged RV, RA pressure 15 mmHg.  Treated with IV diuresis, later transitioned to p.o at discharge.  Also treated for COPD exacerbation given presentation with diffuse expiratory wheezes and initial O2 requirement: Given p.o. azithromycin  500 mg daily for 3-day course (11/16-11/18); s/p IV Solu-Medrol  in ED on 11/16, then transitioned to p.o. prednisone  40 mg to complete 5-day course (11/17-11/20); breathing treatments with DuoNebs (weaned over course of hospitalization) and twice daily Breztri , later switched to Yupelri  + Brovana  + Pulmicort  nebs (formula equivalent of home Trelegy Ellipta ).  Breathing and cough improved throughout admission.  Pleural effusion Pulmonary nodule CXR with b/l pleural effusions, R>L. Has a history of pleural effusions. Questionably due to prior chemo drug Dasatinib ;  however, per medication dispense history, this has not been filled since 07/2023.  CT PE showed enlarging right pleural effusion and small left pleural effusion.  Given this, along with CT PE done 11/16 showing LLL pulmonary nodule, patient underwent thoracentesis on 11/17, which revealed exudative fluid concerning for malignancy.  Pulmonology was consulted, who recommended fluid cytology to evaluate for CML, pending at time of discharge.  Plan per pulm to undergo biopsy of lung nodule outpatient.  Dysphagia Hx of dysphagia leading to PEG tube placement in 01/2024. Tube still presenting without signs of infection on admission. Per patient, he has been po-ing without issue recently.  SLP consult placed, and recommended MBS.  This was done 11/17 and showed chronic dysphagia due to significantly large fused osteophytes leading to trace aspiration and significant residue in valleculae and pyriform sinuses.  Patient was adamant about continuing to eat as he had been doing, and so no changes to diet were made.  PEG tube was kept in place given potential need for future use. RD was consulted, and recommended Glucerna supplementation 3 times daily.  Memory concern, concern for dementia Concerns for memory impairment per family seen in prior notes.  Dementia workup pursued inpatient given patient's poor compliance with outpatient follow-up.  TSH was normal.  Vitamin B12 was elevated, potentially in setting of recent supplementation versus malignancy.  CT head done 08/11/24 showed no acute abnormalities: did show microvascular disease bridging from lateral horns along lateral ventricle, concerning for dementia.  Bilateral lower extremity edema Admission exam with R>L lower extremity edema.  Given robust cancer history, bilateral DVT US  was done 11/18 and showed no evidence of DVT bilaterally.   Alcohol use Elevated Liver Enzymes Patient with regular and robust alcohol use; no withdrawal/seizure history.  CIWA was  monitored throughout admission and remained unremarkable. Did have elevated liver enzymes and bili, which improved (but did not normalize) throughout admission. CTAP without significant hepatic findings; RUQ US  suggestive of hepatic steatosis. Suspect in setting of alcohol use.  Other chronic conditions were medically managed with home medications and formulary alternatives as necessary (hyponatremia, hypotension, BPH, anemia).  PCP Follow-up Recommendations: Ensure follow-up with outpatient pulmonology for lung biopsy Ensure follow-up with outpatient oncology; they can consider switch from Dasatinib  to other chemotherapy regimen (Dasatinib  held at discharge) Continue to monitor ascending aortic aneurysm annually. Follow-up in 1-year with adrenal washout CT or chemical-shift MR to evaluate adrenal nodule. Ensure follow-up with neurology Screen for depression; consider referral to therapy/medications Follow-up likely dementia diagnosis Encourage alcohol abstinence

## 2024-08-08 NOTE — ED Notes (Addendum)
 Attending provider requesting this RN to do a bedside swallow, pt able to drink water  without difficulty, pt requesting a diet order, informs this RN he tolerates PO fine at home. This RN notified attending, awaiting order for diet, instructed to give PO meds.

## 2024-08-08 NOTE — H&P (Cosign Needed Addendum)
 Hospital Admission History and Physical Service Pager: [phone]  Patient name: Kyle Montoya Medical record number: [id number] Date of Birth: 1950/10/20 Age: 73 y.o. Gender: male  Primary Care Provider: Nestor Elston NOVAK, NP Consultants: None  Code Status: DNR/DNI Preferred Emergency Contact:  Contact Information     Name Relation Home Work [facility] Daughter   [phone]   Ryszard, Socarras   [phone]   [facility] Brother [phone]  [phone]   Nazir,ronnie Brother   [phone]    Chief Complaint: Cough   Differential and Medical Decision Making:  Cruise Baumgardner is a 73 y.o. male with PMH of CHF(?), COPD, EtOH abuse, hx dysphagia s/p PEG (01/2024), history of multiple cancers (lung cancer sp resection, CML, cholangiocarcinoma), HTN, ascending aortic aneurysm, IDA presenting with cough and trouble breathing.   Very broad differential for this patient's presentation given robust medical history and somewhat equivocal presentation, including:  - COPD exacerbation: Patient with hx of same, presenting with cough and dyspnea and with wheezing on exam concerning for exacerbation; treating with breathing treatments, steroids, abx. - CHF exacerbation: Questionable hx of CHF; recent echo 03/2024 with possible Grade 1 diastolic dysfunction, not very convincing. BNP of 203, minimally elevated. Questionable medication compliance, pt reports he was taking diuretic outpatient, though unsure of when he was taking this/it's name (son puts together patient's meds for him). - Infection/Aspiration PNA: Normal white count of 7.4. Negative respiratory panel. Treating for possible COPD exacerbation with azithro; can broaden/intensify antibiotic regimen if CT concerning for same. Given history of dysphagia with concerning Barium swallow in 01/2024 leading to PEG tube placement, and with patient reporting eating/drinking po now, increasing concern for aspiration pneumonia. Given  no fever, stable vitals, and normal white count, low concern for sepsis; no indication for blood cx. - PE: Hx of malignancy. Wells' score of 4, moderate risk PE. Also with R>L bilateral lower extremity edema concerning for possible DVT source. Will investigate further with CTPE; also investigating with DVT bilateral US . - Malignancy: Known hx lung cancer, cholangiocarcinoma, CML; currently in remission, but without recent follow-up with oncology.  Has pleural effusions on CXR, will investigate with CT PE.  Also with fluid buildup in belly, given history of biliary cancer, will investigate further with CT AP. - MI/ACS: No new EKG changes. Initial trop of 28, repeat 25. Low concern at this time.  Has a history of pleural effusions. Questionably due to prior chemo drug Dasatinib ; however, per medication dispense history, this has not been filled since 07/2023. Still with pleural effusions on CXR today. Investigating further with imaging.  Assessment & Plan Dyspnea Cough COPD (chronic obstructive pulmonary disease) (HCC) - Admit to FMTS, attending Dr. McDiarmid - Telemetry level of care, Vital signs per floor - Imaging: CTPE, CTAP, echocardiogram - Antibiotics: PO Azithromycin  500 mg daily for 3 days (11/16-11/18) - Breathing tx: Home Breztri (formulary equivalent of Trelegy Ellipta) 2 puffs twice daily; DuoNebs every 4 hours for 24 hours, followed by every 4 as needed - Prednisone  40 mg daily starting 11/17 to complete 5-day course (11/17-11/20) - S/p IV Solu-Medrol once in ED 11/16 - S/p IV Lasix  60 mg once in the ED; not redosing diuretics for now pending clarification on what patient was taking - Labs: AM CMP, CBC, Mag - Strict I's/O's, daily weights - Consider PT/OT eval once breathing improved Pleural effusion CXR with b/l pleural effusions, R>L. - Investigating with CT PE as above Bilateral lower extremity edema R>L lower extremity edema.  Given robust cancer history, will rule out  clot. - Bilateral DVT US  Dysphagia Hx of dysphagia leading to PEG tube placement in 01/2024. Tube still presenting without signs of infection on admission. Per patient, he has been po-ing without issue recently. - Asked RN to get bedside swallow - SLP eval placed - Keep PEG tube in place for now pending repeat barium swallow Alcohol use Regular alcohol use with last drink 11/15. - CIWAs ordered without Ativan  - Folic acid , thiamine , multivitamin Hyponatremia Na of 128 on admission. No s/s of hyponatremia, no indication for repletion at this time. - Evaluating ability to po prior to starting diet. Chronic health problem HTN: Not starting any home medications until we can verify what he was taking Cancer history: Lung cancer s/p resection, cholangiocarcinoma, CML; per chart review, does not appear he was been taking any chemotherapy medications since 07/2023 Anemia: Stable, will monitor   FEN/GI: NPO pending bedside swallow/SLP eval VTE Prophylaxis: Lovenox   Disposition: Telemetry  History of Present Illness:  Kyle Montoya is a 73 y.o. male presenting with cough and trouble breathing.  2 weeks of coughing and worse breathing. Feels like it happened suddenly. Worse when laying flat and has to sleep using pillows at night; not new.  Feels like he has more fluid swelling in both legs and stomach.  Does report he normally has more fluid in right leg versus left.  Feels similar to prior times in the hospital. Has not been taking diuretic. Does not know what medications he takes.   Denies CP. Reports having some episodes of racing heart. Denies URI symptoms.  Denies fever/chills. Denies sick contacts. Denies constipation. No trouble urinating; Urine darker in color,  noticed it yesterday.  History of COPD. Takes daily inhaler. Albuterol  seems to help some with symptoms. Does not use O2 at home. Denies OSA. Smoking history: quit 5 years ago   In the ED, got Nebs x3, IV Lasix  60 mg once, IV  Solu-Medrol once.  CXR with bilateral pleural effusions, moderate on right and small on left; also with possible atelectasis or airspace disease.  BNP of 203.  Negative respiratory panel.  Review Of Systems: Per HPI with the following additions: as above  Pertinent Past Medical History: CHF COPD EtOH abuse hx dysphagia s/p PEG (01/2024) history of multiple cancers (lung cancer sp resection, CML, cholangiocarcinoma) HTN T2DM - last A1c 4.6 6 mo ago  ascending aortic aneurysm IDA Remainder reviewed in history tab.   Pertinent Past Surgical History: - LUL lung resection 10/2014 No recent surgeries or hospitalizations in the past 2 months. Remainder reviewed in history tab.   Pertinent Social History: Tobacco use: Former, quit 5 years ago Alcohol use: Regular; sometimes drinks a pint of whiskey and it couple days, can drink a sixpack of beer and 1 day; Last drink: 11/16; denies withdrawal symptoms/history of seizure Other Substance use: denies Lives with son  Pertinent Family History: Mother: HTN Father: Prostate cx Half-sis (mat): HTN; Half-sis (mat): asthma; Half-sis (mat): cancer Brother: HTN   Important Outpatient Medications: Pending med rec; patient does report taking diuretic and using a breathing treatment, though unsure how often he is taking them.  Objective: BP (!) 161/89 (BP Location: Right Arm)   Pulse 76   Temp 97.6 F (36.4 C)   Resp 20   Ht 5' 11 (1.803 m)   Wt 90.7 kg   SpO2 99%   BMI 27.89 kg/m  Exam: General: Patient in bed with head of bed elevated, no  acute distress. Eyes: EOMI bilaterally.  Sclera nonicteric, conjunctiva noninjected. Neck: Supple Cardiovascular: Regular rate and rhythm, no murmurs/rubs/gallops. Respiratory: Normal work of breathing on room air.  Lungs with expiratory wheeze diffusely throughout all fields. Gastrointestinal: Bowel sounds present and normoactive bilaterally. Soft, nontender.  Some tightness/fluid.  No fluid  wave. MSK: No gross abnormalities of all 4 extremities. Derm: No rash of visible skin Neuro: Alert and oriented to person, place, questionably oriented to time.  Appropriately responding to questions. Psych: Appropriate affect.  Labs:  CBC BMET  Recent Labs  Lab 08/08/24 0930  WBC 7.4  HGB 10.9*  HCT 31.9*  PLT 102*   Recent Labs  Lab 08/08/24 0930  NA 128*  K 3.5  CL 97*  CO2 19*  BUN 9  CREATININE 0.71  GLUCOSE 119*  CALCIUM 7.5*     BNP: 203 Resp panel: Negative Trop: 28, 25   EKG: My own interpretation (not copied from electronic read) Sinus with RBBB (seen on prior EKG)  Imaging Studies Performed:  CXR 11/16: Impression from Radiologist:  1. New bilateral pleural effusions, moderate on the right and small on the left. 2. Bibasilar, hazy lung opacities that may represent atelectasis or airspace disease. No frank interstitial edema.  My Interpretation: Bilateral pleural effusions, R>L. ?reduced lung volume vs postural?   Larraine Palma, MD 08/08/2024, 3:46 PM PGY-1, [facility]  FPTS Intern pager: [phone], text pages welcome Secure chat group [facility] Teaching Service   I have reviewed the above note, agree with its content, and have made the appropriate changes.   Damien Pinal, DO [facility], PGY-3 08/08/24 4:11 PM

## 2024-08-08 NOTE — Assessment & Plan Note (Signed)
 Regular alcohol use with last drink 11/15. - CIWAs ordered without Ativan  - Folic acid , thiamine , multivitamin

## 2024-08-08 NOTE — Assessment & Plan Note (Addendum)
-   Admit to FMTS, attending Dr. McDiarmid - Telemetry level of care, Vital signs per floor - Imaging: CTPE, CTAP, echocardiogram - Antibiotics: PO Azithromycin  500 mg daily for 3 days (11/16-11/18) - Breathing tx: Home Breztri (formulary equivalent of Trelegy Ellipta) 2 puffs twice daily; DuoNebs every 4 hours for 24 hours, followed by every 4 as needed - Prednisone  40 mg daily starting 11/17 to complete 5-day course (11/17-11/20) - S/p IV Solu-Medrol once in ED 11/16 - S/p IV Lasix  60 mg once in the ED; not redosing diuretics for now pending clarification on what patient was taking - Labs: AM CMP, CBC, Mag - Strict I's/O's, daily weights - Consider PT/OT eval once breathing improved

## 2024-08-08 NOTE — ED Provider Notes (Signed)
 [place] [facility] Provider Note   CSN: [id number] Arrival date & time: 08/08/24  9089     Patient presents with: Cough and Leg Swelling   Kyle Montoya is a 73 y.o. male with past medical history as listed below presents with complaints of nonproductive cough and shortness of breath x 3 days.  Does not associate with any other URI symptoms.  Denies any chest pain.  Is associated with bilateral lower extremity edema.  Reports that he is on a diuretic.  He is not sure how often he takes it.  Was last here 10/14 for similar complaints in the setting of noncompliance of his diuretic.  Patient is also requesting removal of his feeding tube.  Reportedly was placed 2 months ago.  However he has been tolerating p.o. without difficulty.  He denies any abdominal pain.    Cough   Past Medical History:  Diagnosis Date   Abdominal pain 09/13/2022   Unspecified abdominal pain     Abnormal colonoscopy 02/23/2021   Abnormal transaminases 02/27/2022   Acute diastolic (congestive) heart failure (HCC) 10/08/2023   Acute on chronic diastolic CHF (congestive heart failure) (HCC) 02/08/2024   Adenomatous polyp of descending colon 02/23/2021   Adverse drug reaction, initial encounter 07/04/2016   07/02/2016: Prevnar 23 vaccine given in right arm  07/02/2016: localized swelling  10/11/20167: Increase in swelling from shoulder to inner aspect of elbow.Red, warm and firm to touch.  No lip or airway swelling or stridor.  2+ brachial and radial pulses right arm.  Capillary refill < 2 seconds     AKI (acute kidney injury) 12/02/2023   Alcohol abuse 12/02/2023   Alcohol withdrawal (HCC) 02/08/2024   Anemia in chronic illness 01/22/2013   Arcus senilis of both eyes 04/05/2022   Arcus senilis, bilateral     Arthritis, degenerative 01/31/2015   Ascending aortic aneurysm 06/27/2022   Atherosclerosis of coronary artery without angina pectoris 02/19/2024   Benign fibroma of  prostate 01/31/2015   Bowel sounds hyperactive 09/13/2022   Hyperactive bowel sounds     BP (high blood pressure) 01/22/2013   BPH (benign prostatic hyperplasia)    CAD (coronary artery disease)    Cannot sleep 01/31/2015   Carcinoma of biliary tract (HCC) 02/15/2013   Diagnosed in 2014, Pt had whipple done at [facility], under care by Dr. Abran Kallman Oncologist, has f/u every 6-12 months.   Cataract    bilateral sx   Cervical osteoarthritis 10/18/2014   Cholangiocarcinoma of biliary tract (HCC)    Chronic cough 01/31/2015   Chronic myelogenous leukemia (HCC)    Chronic pain 01/31/2015   CML (chronic myelocytic leukemia) (HCC) 10/18/2014   Overview:   Follows with [facility], Dr. Cornelius. On dasatinib , last WBC 9.6.     Cognitive communication disorder 02/19/2024   Compulsive tobacco user syndrome 01/31/2015   Congestive heart failure (CHF) (HCC) 09/26/2023   Constipation 09/13/2022   Constipation, unspecified     COPD (chronic obstructive pulmonary disease) (HCC)    uses inhaler   COPD with asthma (HCC) 01/22/2013   Severe obstruction on spirometry FeV1 54% FeV1/FVC 51% 01/31/2015  CXR NAD 01/25/15     Diuretic-induced hypokalemia 05/23/2023   DJD (degenerative joint disease)    Dyslipidemia    Dyspnea 09/18/2023   Shortness of breath     Edema 10/01/2023   Edema, unspecified     Electrocardiogram abnormal 08/23/2022   Abnormal electrocardiogram [ECG] [EKG]  Encounter for examination of eyes and vision without abnormal findings 08/23/2022   Encounter for exam of eyes and vision w/o abnormal findingsEncounter for exam of eyes and vision w/o abnormal findings; Start Date : 04/05/2022     Family history of colonic polyps    Family history of prostate cancer    Full thickness rotator cuff tear 08/08/2020   Gastroesophageal reflux disease without esophagitis 02/19/2024   Gastrostomy present (HCC) 02/20/2024   Gout    High coronary artery calcium score 05/30/2023    History of colon polyps 02/23/2021   History of colonic polyps 10/01/2021   IMO SNOMED Dx Update Oct 2024     HLD (hyperlipidemia) 01/31/2015   HTN (hypertension)    on meds   Hypercalcemia 08/10/2020   Hypercholesterolemia    Hypocalcemia 08/27/2021   Hypokalemia 12/02/2023   Hyponatremia 09/19/2023   Hypotension 12/02/2023   Impingement syndrome of both shoulders 03/31/2020   Iron  deficiency anemia due to chronic blood loss 03/12/2024   Lung cancer (HCC)    Malnutrition of moderate degree 02/11/2024   Metabolic acidosis 12/02/2023   Mild intermittent asthma 02/03/2024   Muscle weakness 02/20/2024   Myelogenous leukemia (HCC)    Need for assistance with personal care 02/20/2024   Non-small cell carcinoma of lung (HCC) 01/31/2015   02/13/2015 CT Chest no evidence of recurrence in lung cancer  Stage I adenocarcinoma diagnosed with left upper lobectomy in February 2016     OA (osteoarthritis)    Oropharyngeal dysphagia 02/19/2024   Orthostatic hypotension 10/10/2023   Other dietary vitamin B12 deficiency anemia 10/17/2023   Other dietary vitamin B12 deficiency anemiaOther dietary vitamin B12 deficiency anemia; Start Date : 11/01/2024Other dietary vitamin B12 deficiency anemia; Start Date : 09/19/2024Other dietary vitamin B12 deficiency anemia; Start Date : 08/23/2024Other dietary vitamin B12 deficiency anemia; Start Date : 07/01/2024Other dietary vitamin B12 deficiency anemia; Start Date : 06/03/2024Other dieta   Other specified health status 08/23/2022   Other specified health status     PAD (peripheral artery disease)    Personal history of gastric ulcers 02/23/2021   Pleural effusion 10/31/2023   Pleural effusion, not elsewhere classifiedPleural effusion, not elsewhere classified; Start Date : 01/24/2025Pleural effusion, not elsewhere classified; Start Date : 09/18/2023     Pneumonia 09/18/2023   Pneumonia, unspecified organism     Pneumonitis due to inhalation of  regurgitated food (HCC) 02/19/2024   Proteinuria 08/23/2022   Proteinuria, unspecified     Reduced mobility 02/20/2024   Renal insufficiency    Right lower lobe pneumonia 02/05/2024   Shock (HCC) 12/02/2023   Shoulder pain, left 03/29/2020   Shoulder pain, right 03/29/2020   Tendonitis    patellar   Thoracic aortic aneurysm 06/27/2022   Tobacco use 08/23/2022   Tobacco use     Type 2 diabetes mellitus with hyperlipidemia (HCC)    Unsteadiness on feet 02/20/2024   Vitamin D  deficiency 02/03/2024   Wheezing 01/31/2015   Past Surgical History:  Procedure Laterality Date   BIOPSY  04/30/2021   Procedure: BIOPSY;  Surgeon: Wilhelmenia Aloha Raddle., MD;  Location: [facility];  Service: Gastroenterology;;   Bowel duct surg  2014   Bowel cancer   CARDIAC CATHETERIZATION  2000   CHOLECYSTECTOMY     COLONOSCOPY  06/23/2015   mild sigmoid diverticulosis. small external and internal hemorrhoids. otherwise normal colonoscopy to terminal ileum   COLONOSCOPY WITH PROPOFOL  N/A 04/30/2021   Procedure: COLONOSCOPY WITH PROPOFOL ;  Surgeon: Wilhelmenia Aloha Raddle., MD;  Location: [facility];  Service: Gastroenterology;  Laterality: N/A;   ENDOSCOPIC MUCOSAL RESECTION N/A 04/30/2021   Procedure: ENDOSCOPIC MUCOSAL RESECTION;  Surgeon: Wilhelmenia Aloha Raddle., MD;  Location: [facility];  Service: Gastroenterology;  Laterality: N/A;   ESOPHAGOGASTRODUODENOSCOPY (EGD) WITH PROPOFOL  N/A 04/30/2021   Procedure: ESOPHAGOGASTRODUODENOSCOPY (EGD) WITH PROPOFOL ;  Surgeon: Wilhelmenia Aloha Raddle., MD;  Location: [facility];  Service: Gastroenterology;  Laterality: N/A;   HEMOSTASIS CLIP PLACEMENT  04/30/2021   Procedure: HEMOSTASIS CLIP PLACEMENT;  Surgeon: Wilhelmenia Aloha Raddle., MD;  Location: [facility];  Service: Gastroenterology;;   IR GASTROSTOMY TUBE MOD SED  02/17/2024   LUNG CANCER SURGERY  09/2014   partial lobectomy   POLYPECTOMY  04/30/2021   Procedure: POLYPECTOMY;  Surgeon:  Mansouraty, Aloha Raddle., MD;  Location: [facility];  Service: Gastroenterology;;   SHOULDER OPEN ROTATOR CUFF REPAIR Right 09/27/2020   Procedure: Right shoulder mini open rotator cuff repair, distal clavicle resection;  Surgeon: Duwayne Purchase, MD;  Location: [facility];  Service: Orthopedics;  Laterality: Right;  90 mins  Choice with Block anesthesia   SUBMUCOSAL LIFTING INJECTION  04/30/2021   Procedure: SUBMUCOSAL LIFTING INJECTION;  Surgeon: Wilhelmenia Aloha Raddle., MD;  Location: [facility];  Service: Gastroenterology;;   UPPER GASTROINTESTINAL ENDOSCOPY     WHIPPLE PROCEDURE  02/25/2013   Procedure: WHIPPLE PROCEDURE; Surgeon: Abran Kallman, MD; [facility]: [facility]; Service: General; Laterality: N/A;       Prior to Admission medications   Medication Sig Start Date End Date Taking? Authorizing Provider  albuterol  (PROVENTIL  HFA;VENTOLIN  HFA) 108 (90 BASE) MCG/ACT inhaler Inhale 2 puffs into the lungs every 6 (six) hours as needed for wheezing or shortness of breath.   Yes [provider]  allopurinol  (ZYLOPRIM ) 300 MG tablet Place 1 tablet (300 mg total) into feeding tube daily. Patient taking differently: Take 300 mg by mouth daily. 02/18/24  Yes Fairy Frames, MD  ascorbic acid (VITAMIN C) 500 MG tablet Take 500 mg by mouth daily.    [provider]  aspirin  EC 81 MG tablet Take 1 tablet (81 mg total) by mouth daily. Swallow whole. 02/18/24   Fairy Frames, MD  Calcium Carb-Cholecalciferol (CALCIUM 600+D3) 600-5 MG-MCG TABS Take 2 tablets by mouth daily.    [provider]  Cholecalciferol (VITAMIN D ) 50 MCG (2000 UT) tablet Place 1 tablet (2,000 Units total) into feeding tube daily. 02/18/24   Fairy Frames, MD  cyclobenzaprine (FLEXERIL) 5 MG tablet Take 5 mg by mouth daily.    [provider]  dasatinib  (SPRYCEL ) 100 MG tablet Place 1 tablet (100 mg total) into feeding tube daily. 02/18/24   Fairy Frames, MD  diclofenac Sodium (VOLTAREN) 1 %  GEL Apply 2 g topically at bedtime as needed (Pain). 07/10/20   [provider]  famotidine  (PEPCID ) 40 MG tablet Place 1 tablet (40 mg total) into feeding tube daily. 02/18/24   Fairy Frames, MD  Ferrous Sulfate 28 MG TABS Take 1 tablet by mouth daily.    [provider]  fluticasone  (FLONASE ) 50 MCG/ACT nasal spray Place 2 sprays into both nostrils daily.    [provider]  folic acid  (FOLVITE ) 1 MG tablet Place 1 tablet (1 mg total) into feeding tube daily. 02/18/24   Fairy Frames, MD  HYDROcodone -acetaminophen  (NORCO/VICODIN) 5-325 MG tablet Place 1 tablet into feeding tube every 6 (six) hours as needed for moderate pain (pain score 4-6). 02/19/24   Fairy Frames, MD  irbesartan -hydrochlorothiazide  (AVALIDE) 150-12.5 MG tablet TAKE 1 TAB(S) ORALLY ONCE A DAY  FOR 30 DAY(S) IF BP IS HIGHER THAN 160/90 THAN TAKE A 2ND TABLET    [provider]  methocarbamol  (ROBAXIN ) 750 MG tablet Place 1 tablet (750 mg total) into feeding tube every 6 (six) hours as needed for muscle spasms. 02/18/24   Fairy Frames, MD  midodrine  (PROAMATINE ) 5 MG tablet Place 2 tablets (10 mg total) into feeding tube 2 (two) times daily with a meal. 02/18/24   Fairy Frames, MD  Multiple Vitamin (MULTIVITAMIN WITH MINERALS) TABS tablet Place 1 tablet into feeding tube daily. 02/18/24   Fairy Frames, MD  Nutritional Supplements (FEEDING SUPPLEMENT, OSMOLITE 1.5 CAL,) LIQD Place 1,000 mLs into feeding tube continuous. At  16ml/hr 02/18/24   Joseph, Preetha, MD  polyethylene glycol (MIRALAX  / GLYCOLAX ) 17 g packet Place 17 g into feeding tube daily as needed for moderate constipation. 02/18/24   Fairy Frames, MD  potassium chloride  SA (KLOR-CON  M) 20 MEQ tablet Take 20 mEq by mouth 2 (two) times daily.    [provider]  Protein (FEEDING SUPPLEMENT, PROSOURCE TF20,) liquid Place 60 mLs into feeding tube daily. 02/19/24   Fairy Frames, MD  tamsulosin  (FLOMAX ) 0.4 MG CAPS  capsule Take 2 capsules (0.8 mg total) by mouth daily. Patient not taking: Reported on 08/08/2024 02/18/24   Fairy Frames, MD  torsemide  (DEMADEX ) 20 MG tablet Take 20 mg by mouth daily.    [provider]  TRELEGY ELLIPTA 100-62.5-25 MCG/INH AEPB Inhale 1 puff into the lungs daily at 6 (six) AM. 12/20/20   [provider]  umeclidinium bromide  (INCRUSE ELLIPTA ) 62.5 MCG/ACT AEPB Inhale 1 puff into the lungs daily.    [provider]  Water  For Irrigation, Sterile (FREE WATER ) SOLN Place 200 mLs into feeding tube every 8 (eight) hours. 02/18/24   Fairy Frames, MD    Allergies: Pneumococcal vaccines    Review of Systems  Respiratory:  Positive for cough.     Updated Vital Signs BP (!) 161/89 (BP Location: Right Arm)   Pulse 76   Temp 97.6 F (36.4 C)   Resp 20   Ht 5' 11 (1.803 m)   Wt 90.7 kg   SpO2 99%   BMI 27.89 kg/m   Physical Exam Vitals and nursing note reviewed.  Constitutional:      General: He is not in acute distress.    Appearance: He is well-developed.  HENT:     Head: Normocephalic and atraumatic.  Eyes:     Conjunctiva/sclera: Conjunctivae normal.  Cardiovascular:     Rate and Rhythm: Normal rate and regular rhythm.     Heart sounds: No murmur heard. Pulmonary:     Effort: Pulmonary effort is normal. No respiratory distress.     Breath sounds: Normal breath sounds.  Abdominal:     Palpations: Abdomen is soft.     Tenderness: There is no abdominal tenderness.  Musculoskeletal:        General: Swelling present.     Cervical back: Neck supple.     Comments: 3+ bilateral pitting edema, DP/PT pulses 2+, negative Homans  Skin:    General: Skin is warm and dry.     Capillary Refill: Capillary refill takes less than 2 seconds.  Neurological:     Mental Status: He is alert.  Psychiatric:        Mood and Affect: Mood normal.     (all labs ordered are listed, but only abnormal results are displayed) Labs Reviewed  BASIC  METABOLIC PANEL WITH GFR -  Abnormal; Notable for the following components:      Result Value   Sodium 128 (*)    Chloride 97 (*)    CO2 19 (*)    Glucose, Bld 119 (*)    Calcium 7.5 (*)    All other components within normal limits  CBC - Abnormal; Notable for the following components:   RBC 3.66 (*)    Hemoglobin 10.9 (*)    HCT 31.9 (*)    RDW 18.6 (*)    Platelets 102 (*)    All other components within normal limits  BRAIN NATRIURETIC PEPTIDE - Abnormal; Notable for the following components:   B Natriuretic Peptide 203.2 (*)    All other components within normal limits  TROPONIN I (HIGH SENSITIVITY) - Abnormal; Notable for the following components:   Troponin I (High Sensitivity) 28 (*)    All other components within normal limits  TROPONIN I (HIGH SENSITIVITY) - Abnormal; Notable for the following components:   Troponin I (High Sensitivity) 25 (*)    All other components within normal limits  RESP PANEL BY RT-PCR (RSV, FLU A&B, COVID)  [id number]    EKG: EKG Interpretation Date/Time:  Sunday August 08 2024 09:41:25 EST Ventricular Rate:  76 PR Interval:  96 QRS Duration:  130 QT Interval:  432 QTC Calculation: 486 R Axis:   -37  Text Interpretation: Sinus rhythm with short PR Left axis deviation Right bundle branch block Inferior infarct , age undetermined Abnormal ECG When compared with ECG of 10-Feb-2024 09:51, PREVIOUS ECG IS PRESENT No significant change since last tracing Confirmed by Dean Clarity [phone]) on 08/08/2024 10:30:54 AM  Radiology: [facility] Chest 2 View Result Date: 08/08/2024 EXAM: 2 VIEW(S) XRAY OF THE CHEST 08/08/2024 10:11:00 AM COMPARISON: 07/06/2024 CLINICAL HISTORY: SOB, cough FINDINGS: LUNGS AND PLEURA: New bilateral pleural effusions, moderate on the right and small on the left. Overlying hazy lung opacities which may reflect atelectasis or airspace disease. No frank interstitial edema. No pneumothorax. HEART AND MEDIASTINUM: No acute abnormality of  the cardiac and mediastinal silhouettes. BONES AND SOFT TISSUES: No acute osseous abnormality. IMPRESSION: 1. New bilateral pleural effusions, moderate on the right and small on the left. 2. Bibasilar, hazy lung opacities that may represent atelectasis or airspace disease. No frank interstitial edema. Electronically signed by: Waddell Calk MD 08/08/2024 10:33 AM EST RP Workstation: [id number]     Procedures   Medications Ordered in the ED  ipratropium-albuterol  (DUONEB) 0.5-2.5 (3) MG/3ML nebulizer solution 3 mL (3 mLs Nebulization Given 08/08/24 1054)  methylPREDNISolone sodium succinate (SOLU-MEDROL) 125 mg/2 mL injection 125 mg (125 mg Intravenous Given 08/08/24 1052)  furosemide  (LASIX ) injection 60 mg (60 mg Intravenous Given 08/08/24 1052)  ipratropium-albuterol  (DUONEB) 0.5-2.5 (3) MG/3ML nebulizer solution 3 mL (3 mLs Nebulization Given 08/08/24 1401)  ipratropium-albuterol  (DUONEB) 0.5-2.5 (3) MG/3ML nebulizer solution 3 mL (3 mLs Nebulization Given 08/08/24 1430)    Clinical Course as of 08/08/24 1517  Sun Aug 08, 2024  1028 Patient with history of asthma, CHF and numerous other cardiovascular risk factors evaluated for complaints of nonproductive cough, bilateral lower extremity edema, shortness of breath x 3 days.  Is additionally requesting removal of his feeding tube placed 2 months ago.  He is tolerating p.o. without difficulty.  Upon arrival he is hemodynamically stable.  He does have diffuse rhonchi on exam.  He has had minimal improvement with breathing treatments at home and nebulizers here.  His BNP is mildly elevated.  His chest x-ray is consistent with  bilateral pleural effusions.  Likely etiology is asthma exacerbation in the setting of CHF with pleural effusions secondary to diuretic noncompliance.  Will continue to administer nebulizer treatments, IV diuretics and admit patient for further management.  As he has had unsuccessful ambulation test. [JT]  1422 Discussed patient  with family medicine service, agreed for admission. [JT]    Clinical Course User Index [JT] Donnajean Lynwood DEL, PA-C                                 Medical Decision Making Amount and/or Complexity of Data Reviewed Labs: ordered. Radiology: ordered.  Risk Prescription drug management. Decision regarding hospitalization.   This patient presents to the ED with chief complaint(s) of cough and concerned about feeding tube.  The complaint involves an extensive differential diagnosis and also carries with it a high risk of complications and morbidity.   Pertinent past medical history as listed in HPI  The differential diagnosis includes  ACS, PE, CHF, asthma, URI Additional history obtained: Records reviewed previous admission documents and Care Everywhere/External Records  Disposition:   Patient will be admitted for further management.  Social Determinants of Health:   none  This note was dictated with voice recognition software.  Despite best efforts at proofreading, errors may have occurred which can change the documentation meaning.       Final diagnoses:  Moderate persistent asthma with exacerbation  Acute decompensated heart failure [facility])    ED Discharge Orders     None          Donnajean Lynwood DEL, PA-C 08/08/24 1517    Dean Clarity, MD 08/08/24 [phone]

## 2024-08-08 NOTE — Assessment & Plan Note (Signed)
 CXR with b/l pleural effusions, R>L. - Investigating with CT PE as above

## 2024-08-08 NOTE — ED Notes (Signed)
 Patient transported to X-ray

## 2024-08-08 NOTE — ED Notes (Signed)
 CCMD Called for continuous cardiac monitoring

## 2024-08-08 NOTE — Plan of Care (Signed)
 FMTS Brief Progress Note  S: To bedside for night rounds, pt admitted earlier today Currently being treated for COPD exacerbation with pleural effusions and additional imaging findings on CT Awaiting DVT US  and echo. Diuresed earlier today with 60mg  IV lasix , - documented  Only concern is cough, denies any other concerns.    O: BP (!) 141/82 (BP Location: Right Arm)   Pulse (!) 102   Temp 97.6 F (36.4 C) (Oral)   Resp (!) 21   Ht 5' 11 (1.803 m)   Wt 98.7 kg   SpO2 98%   BMI 30.35 kg/m    General: Well-appearing, no acute distress Cardio: Regular rate, regular rhythm, no murmurs on exam. Pulm: Expiratory wheezes throughout, no crackles. No increased work of breathing Extremities: no peripheral edema, warm and dry  A/P:  COPD exacerbation Pleural effusions -continue current inhaler, steroid, abx regimen -Consider repeat CXR and additional diuresis if resp distress overnight, otherwise defer to AM team - Tessalon  capsules 100 mg BID for cough  Alcohol use Last drink 11/15 -CIWA assessments, most recent score 0 -inform primary team if elevated, consider ativan  if needed  - Orders reviewed. Labs for AM ordered, which were adjusted as needed.    Alan Maiden, MD PGY-1, [facility] Night Resident  Please page [phone] with questions.

## 2024-08-08 NOTE — Assessment & Plan Note (Signed)
 Na of 128 on admission. No s/s of hyponatremia, no indication for repletion at this time. - Evaluating ability to po prior to starting diet.

## 2024-08-08 NOTE — Assessment & Plan Note (Signed)
 Hx of dysphagia leading to PEG tube placement in 01/2024. Tube still presenting without signs of infection on admission. Per patient, he has been po-ing without issue recently. - Asked RN to get bedside swallow - SLP eval placed - Keep PEG tube in place for now pending repeat barium swallow

## 2024-08-08 NOTE — ED Notes (Signed)
 Pt to CT

## 2024-08-08 NOTE — Assessment & Plan Note (Signed)
 HTN: Not starting any home medications until we can verify what he was taking Cancer history: Lung cancer s/p resection, cholangiocarcinoma, CML; per chart review, does not appear he was been taking any chemotherapy medications since 07/2023 Anemia: Stable, will monitor

## 2024-08-09 ENCOUNTER — Inpatient Hospital Stay (HOSPITAL_COMMUNITY)

## 2024-08-09 ENCOUNTER — Other Ambulatory Visit (HOSPITAL_COMMUNITY): Payer: Self-pay

## 2024-08-09 DIAGNOSIS — R748 Abnormal levels of other serum enzymes: Secondary | ICD-10-CM | POA: Diagnosis present

## 2024-08-09 DIAGNOSIS — Z931 Gastrostomy status: Secondary | ICD-10-CM

## 2024-08-09 DIAGNOSIS — J90 Pleural effusion, not elsewhere classified: Secondary | ICD-10-CM

## 2024-08-09 DIAGNOSIS — I509 Heart failure, unspecified: Secondary | ICD-10-CM

## 2024-08-09 DIAGNOSIS — R0689 Other abnormalities of breathing: Secondary | ICD-10-CM | POA: Diagnosis not present

## 2024-08-09 DIAGNOSIS — R413 Other amnesia: Secondary | ICD-10-CM | POA: Diagnosis present

## 2024-08-09 DIAGNOSIS — R17 Unspecified jaundice: Secondary | ICD-10-CM | POA: Diagnosis present

## 2024-08-09 DIAGNOSIS — R0609 Other forms of dyspnea: Secondary | ICD-10-CM

## 2024-08-09 DIAGNOSIS — R7401 Elevation of levels of liver transaminase levels: Secondary | ICD-10-CM

## 2024-08-09 DIAGNOSIS — R059 Cough, unspecified: Secondary | ICD-10-CM

## 2024-08-09 DIAGNOSIS — J441 Chronic obstructive pulmonary disease with (acute) exacerbation: Secondary | ICD-10-CM

## 2024-08-09 DIAGNOSIS — R609 Edema, unspecified: Secondary | ICD-10-CM

## 2024-08-09 DIAGNOSIS — R4189 Other symptoms and signs involving cognitive functions and awareness: Secondary | ICD-10-CM | POA: Diagnosis present

## 2024-08-09 DIAGNOSIS — J45901 Unspecified asthma with (acute) exacerbation: Secondary | ICD-10-CM

## 2024-08-09 HISTORY — PX: IR THORACENTESIS RIGHT ASP PLEURAL SPACE W/IMG GUIDE: IMG5380

## 2024-08-09 LAB — LACTATE DEHYDROGENASE, PLEURAL OR PERITONEAL FLUID: LD, Fluid: 201 U/L — ABNORMAL HIGH (ref 3–23)

## 2024-08-09 LAB — ECHOCARDIOGRAM COMPLETE
Height: 71 in
S' Lateral: 3.2 cm
Weight: 3439.18 [oz_av]

## 2024-08-09 LAB — GLUCOSE, PLEURAL OR PERITONEAL FLUID: Glucose, Fluid: 286 mg/dL

## 2024-08-09 LAB — BODY FLUID CELL COUNT WITH DIFFERENTIAL
Eos, Fluid: 0 %
Lymphs, Fluid: 64 %
Monocyte-Macrophage-Serous Fluid: 10 % — ABNORMAL LOW (ref 50–90)
Neutrophil Count, Fluid: 25 % (ref 0–25)
Other Cells, Fluid: 1 %
Total Nucleated Cell Count, Fluid: 4900 uL — ABNORMAL HIGH (ref 0–1000)

## 2024-08-09 LAB — MAGNESIUM: Magnesium: 1.8 mg/dL (ref 1.7–2.4)

## 2024-08-09 LAB — HEPATIC FUNCTION PANEL
ALT: 383 U/L — ABNORMAL HIGH (ref 0–44)
AST: 438 U/L — ABNORMAL HIGH (ref 15–41)
Albumin: 2.2 g/dL — ABNORMAL LOW (ref 3.5–5.0)
Alkaline Phosphatase: 416 U/L — ABNORMAL HIGH (ref 38–126)
Bilirubin, Direct: 1.5 mg/dL — ABNORMAL HIGH (ref 0.0–0.2)
Indirect Bilirubin: 1.4 mg/dL — ABNORMAL HIGH (ref 0.3–0.9)
Total Bilirubin: 2.9 mg/dL — ABNORMAL HIGH (ref 0.0–1.2)
Total Protein: 5.6 g/dL — ABNORMAL LOW (ref 6.5–8.1)

## 2024-08-09 LAB — PROTEIN, PLEURAL OR PERITONEAL FLUID: Total protein, fluid: <3 g/dL

## 2024-08-09 LAB — CBC
HCT: 28.2 % — ABNORMAL LOW (ref 39.0–52.0)
Hemoglobin: 9.7 g/dL — ABNORMAL LOW (ref 13.0–17.0)
MCH: 29.9 pg (ref 26.0–34.0)
MCHC: 34.4 g/dL (ref 30.0–36.0)
MCV: 87 fL (ref 80.0–100.0)
Platelets: 106 K/uL — ABNORMAL LOW (ref 150–400)
RBC: 3.24 MIL/uL — ABNORMAL LOW (ref 4.22–5.81)
RDW: 18.4 % — ABNORMAL HIGH (ref 11.5–15.5)
WBC: 4.3 K/uL (ref 4.0–10.5)
nRBC: 0 % (ref 0.0–0.2)

## 2024-08-09 LAB — BASIC METABOLIC PANEL WITH GFR
Anion gap: 11 (ref 5–15)
BUN: 11 mg/dL (ref 8–23)
CO2: 20 mmol/L — ABNORMAL LOW (ref 22–32)
Calcium: 7.2 mg/dL — ABNORMAL LOW (ref 8.9–10.3)
Chloride: 96 mmol/L — ABNORMAL LOW (ref 98–111)
Creatinine, Ser: 0.84 mg/dL (ref 0.61–1.24)
GFR, Estimated: >60 mL/min (ref >60)
Glucose, Bld: 284 mg/dL — ABNORMAL HIGH (ref 70–99)
Potassium: 3.6 mmol/L (ref 3.5–5.1)
Sodium: 127 mmol/L — ABNORMAL LOW (ref 135–145)

## 2024-08-09 LAB — ALBUMIN, PLEURAL OR PERITONEAL FLUID: Albumin, Fluid: <1.5 g/dL

## 2024-08-09 MED ORDER — FUROSEMIDE 10 MG/ML IJ SOLN
40.0000 mg | Freq: Once | INTRAMUSCULAR | Status: AC
  Administered 2024-08-09: 40 mg via INTRAVENOUS
  Filled 2024-08-09: qty 4

## 2024-08-09 MED ORDER — IPRATROPIUM-ALBUTEROL 0.5-2.5 (3) MG/3ML IN SOLN: 3.0000 mL | RESPIRATORY_TRACT | Status: DC | PRN

## 2024-08-09 MED ORDER — BUDESONIDE 0.5 MG/2ML IN SUSP
0.5000 mg | Freq: Two times a day (BID) | RESPIRATORY_TRACT | Status: DC
  Administered 2024-08-09 – 2024-08-13 (×8): 0.5 mg via RESPIRATORY_TRACT
  Filled 2024-08-09 (×8): qty 2

## 2024-08-09 MED ORDER — MAGNESIUM SULFATE IN D5W 1-5 GM/100ML-% IV SOLN
1.0000 g | Freq: Once | INTRAVENOUS | Status: AC
  Administered 2024-08-09: 1 g via INTRAVENOUS
  Filled 2024-08-09: qty 100

## 2024-08-09 MED ORDER — GUAIFENESIN-DM 100-10 MG/5ML PO SYRP
5.0000 mL | ORAL_SOLUTION | ORAL | Status: DC | PRN
  Administered 2024-08-09 – 2024-08-10 (×6): 5 mL via ORAL
  Filled 2024-08-09 (×7): qty 5

## 2024-08-09 MED ORDER — IPRATROPIUM-ALBUTEROL 0.5-2.5 (3) MG/3ML IN SOLN
3.0000 mL | Freq: Two times a day (BID) | RESPIRATORY_TRACT | Status: AC
  Administered 2024-08-09: 3 mL via RESPIRATORY_TRACT
  Filled 2024-08-09: qty 3

## 2024-08-09 MED ORDER — ARFORMOTEROL TARTRATE 15 MCG/2ML IN NEBU
15.0000 ug | INHALATION_SOLUTION | Freq: Two times a day (BID) | RESPIRATORY_TRACT | Status: DC
  Administered 2024-08-09 – 2024-08-13 (×8): 15 ug via RESPIRATORY_TRACT
  Filled 2024-08-09 (×8): qty 2

## 2024-08-09 MED ORDER — REVEFENACIN 175 MCG/3ML IN SOLN
175.0000 ug | Freq: Every day | RESPIRATORY_TRACT | Status: DC
  Administered 2024-08-10 – 2024-08-13 (×4): 175 ug via RESPIRATORY_TRACT
  Filled 2024-08-09 (×4): qty 3

## 2024-08-09 MED ORDER — TAMSULOSIN HCL 0.4 MG PO CAPS
0.8000 mg | ORAL_CAPSULE | Freq: Every day | ORAL | Status: DC
  Administered 2024-08-09 – 2024-08-13 (×5): 0.8 mg via ORAL
  Filled 2024-08-09 (×5): qty 2

## 2024-08-09 MED ORDER — POTASSIUM CHLORIDE 20 MEQ PO PACK
40.0000 meq | PACK | Freq: Once | ORAL | Status: AC
  Administered 2024-08-09: 40 meq via ORAL
  Filled 2024-08-09: qty 2

## 2024-08-09 MED ORDER — BENZONATATE 100 MG PO CAPS
100.0000 mg | ORAL_CAPSULE | Freq: Three times a day (TID) | ORAL | Status: DC | PRN
  Administered 2024-08-09 – 2024-08-13 (×8): 100 mg via ORAL
  Filled 2024-08-09 (×8): qty 1

## 2024-08-09 NOTE — Plan of Care (Signed)

## 2024-08-09 NOTE — Progress Notes (Signed)
 Echocardiogram 2D Echocardiogram has been performed.  Kyle Montoya [name] RDCS 08/09/2024, 11:57 AM

## 2024-08-09 NOTE — Assessment & Plan Note (Addendum)
-   Imaging: echocardiogram - Antibiotics: PO Azithromycin  500 mg daily for 3 days (11/16-11/18) - Breathing tx: Home Breztri (formulary equivalent of Trelegy Ellipta) 2 puffs twice daily; DuoNebs every 4 hours for 24 hours, followed by every 4 as needed - Prednisone  40 mg daily starting 11/17 to complete 5-day course (11/17-11/20) - S/p IV Solu-Medrol once in ED 11/16 - Diuresis: IV Lasix  40 mg once - S/p IV Lasix  60 mg once in the ED - Cough: Tessalon  pearls 100 mg TID prn, robitussin 5 mL q4h PRN - Pain: Norco 5-325 mg q6h PRN - Labs: AM CMP, CBC with diff, Mag - Repleting electrolytes as needed; repleted Mag with IV 1 g and K 40 mEq po this morning - Strict I's/O's, daily weights - Consider PT/OT eval once breathing improved

## 2024-08-09 NOTE — Progress Notes (Signed)
 BLE venous duplex has been completed.   Results can be found under chart review under CV PROC. 08/09/2024 4:50 PM Cleveland Paiz RVT, RDMS

## 2024-08-09 NOTE — TOC Initial Note (Signed)
 Transition of Care [facility]) - Initial/Assessment Note    Patient Details  Name: Kyle Montoya MRN: [id number] Date of Birth: 15-May-1951  Transition of Care [facility]) CM/SW Contact:    Luise JAYSON Pan, LCSWA Phone Number: 08/09/2024, 11:55 AM  Clinical Narrative:   Patient reports he is from home and his son lives with him to help take care of him. Patient stated he has a PCP [facility] [address]. Patient reports he has transportation to get to medical appts. Patient reports at d/c his son can pick him up, if not his son he may be able to get someone else to get him. Patient has HH hx w/ [facility]. Patient has previous SNF hx at [facility].   TOC will continue to monitor through daily progression meetings.    Expected Discharge Plan: Home/Self Care Barriers to Discharge: Continued Medical Work up   Patient Goals and CMS Choice Patient states their goals for this hospitalization and ongoing recovery are:: To go home   Choice offered to / list presented to : NA      Expected Discharge Plan and Services In-house Referral: Clinical Social Work Discharge Planning Services: CM Consult Post Acute Care Choice: NA Living arrangements for the past 2 months: Single Family Home                                      Prior Living Arrangements/Services Living arrangements for the past 2 months: Single Family Home Lives with:: Adult Children Patient language and need for interpreter reviewed:: Yes Do you feel safe going back to the place where you live?: Yes      Need for Family Participation in Patient Care: No (Comment) Care giver support system in place?: No (comment) Current home services: DME [facility]) Criminal Activity/Legal Involvement Pertinent to Current Situation/Hospitalization: No - Comment as needed  Activities of Daily Living   ADL Screening (condition at time of admission) Independently performs ADLs?: Yes (appropriate for  developmental age) Is the patient deaf or have difficulty hearing?: No Does the patient have difficulty seeing, even when wearing glasses/contacts?: No Does the patient have difficulty concentrating, remembering, or making decisions?: No  Permission Sought/Granted   Permission granted to share information with : No              Emotional Assessment Appearance:: Appears stated age Attitude/Demeanor/Rapport: Engaged Affect (typically observed): Stable Orientation: : Oriented to Self, Oriented to Place, Oriented to  Time, Oriented to Situation Alcohol / Substance Use: Alcohol Use Psych Involvement: No (comment)  Admission diagnosis:  Dyspnea [R06.00] Moderate persistent asthma with exacerbation [J45.41] Acute decompensated heart failure (HCC) [I50.9] Patient Active Problem List   Diagnosis Date Noted   Cough in adult 08/09/2024   Elevated liver enzymes 08/09/2024   Elevated bilirubin 08/09/2024   Concern about memory 08/09/2024   Cough 08/08/2024   Bilateral lower extremity edema 08/08/2024   Chronic health problem 08/08/2024   Alcohol use 08/08/2024   Dyslipidemia    Iron  deficiency anemia due to chronic blood loss 03/12/2024    Class: Diagnosis of   Gastrostomy present (HCC) 02/20/2024   Muscle weakness 02/20/2024   Need for assistance with personal care 02/20/2024   Reduced mobility 02/20/2024   Unsteadiness on feet 02/20/2024   Atherosclerosis of coronary artery without angina pectoris 02/19/2024   Cognitive communication disorder 02/19/2024   Gastroesophageal reflux disease without esophagitis  02/19/2024   Dysphagia 02/19/2024   Pneumonitis due to inhalation of regurgitated food (HCC) 02/19/2024   Malnutrition of moderate degree 02/11/2024   Acute on chronic diastolic CHF (congestive heart failure) (HCC) 02/08/2024   Alcohol withdrawal (HCC) 02/08/2024   Right lower lobe pneumonia 02/05/2024   Mild intermittent asthma 02/03/2024   Vitamin D  deficiency 02/03/2024    AKI (acute kidney injury) 12/02/2023   Hypotension 12/02/2023   Hypokalemia 12/02/2023   Metabolic acidosis 12/02/2023   Shock (HCC) 12/02/2023   Pleural effusion 10/31/2023   Other dietary vitamin B12 deficiency anemia 10/17/2023   Orthostatic hypotension 10/10/2023   Acute diastolic (congestive) heart failure (HCC) 10/08/2023   Edema 10/01/2023   Congestive heart failure (CHF) (HCC) 09/26/2023   BPH (benign prostatic hyperplasia)    CAD (coronary artery disease)    Chronic myelogenous leukemia (HCC)    COPD (chronic obstructive pulmonary disease) (HCC)    DJD (degenerative joint disease)    Type 2 diabetes mellitus with hyperlipidemia (HCC)    Family history of colonic polyps    HTN (hypertension)    Hypercholesterolemia    Lung cancer (HCC)    Myelogenous leukemia (HCC)    OA (osteoarthritis)    PAD (peripheral artery disease)    Renal insufficiency    Tendonitis    Hyponatremia 09/19/2023   Dyspnea 09/18/2023   Pneumonia 09/18/2023   High coronary artery calcium score 05/30/2023   Diuretic-induced hypokalemia 05/23/2023    Class: Diagnosis of   Abdominal pain 09/13/2022   Bowel sounds hyperactive 09/13/2022   Constipation 09/13/2022   Other specified health status 08/23/2022   Electrocardiogram abnormal 08/23/2022   Encounter for examination of eyes and vision without abnormal findings 08/23/2022   Proteinuria 08/23/2022   Tobacco use 08/23/2022   Ascending aortic aneurysm 06/27/2022   Thoracic aortic aneurysm 06/27/2022   Arcus senilis of both eyes 04/05/2022   Abnormal transaminases 02/27/2022   History of colonic polyps 10/01/2021   Family history of prostate cancer 10/01/2021   Hypocalcemia 08/27/2021   Adenomatous polyp of descending colon 02/23/2021   History of colon polyps 02/23/2021   Abnormal colonoscopy 02/23/2021   Personal history of gastric ulcers 02/23/2021   Hypercalcemia 08/10/2020   Full thickness rotator cuff tear 08/08/2020   Impingement  syndrome of both shoulders 03/31/2020   Shoulder pain, right 03/29/2020   Shoulder pain, left 03/29/2020   Adverse drug reaction, initial encounter 07/04/2016   Adult BMI 30+ 01/31/2015   Benign fibroma of prostate 01/31/2015   Cataract 01/31/2015   Chronic cough 01/31/2015   Chronic pain 01/31/2015   Arthritis, degenerative 01/31/2015   HLD (hyperlipidemia) 01/31/2015   Cannot sleep 01/31/2015   Non-small cell carcinoma of lung (HCC) 01/31/2015   Compulsive tobacco user syndrome 01/31/2015   Wheezing 01/31/2015   CML (chronic myelocytic leukemia) (HCC) 10/18/2014   Gout 10/18/2014   Cervical osteoarthritis 10/18/2014   Carcinoma of biliary tract (HCC) 02/15/2013   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 [place] [address] Phone: [phone] Fax: [phone]  [facility] - [place], [place] - [address] [facility] [address] [place] [address] Phone: [phone] Fax: [phone]     Social Drivers of Health (SDOH) Social History: SDOH Screenings   Food Insecurity:  No Food Insecurity (08/08/2024)  Housing: Low Risk  (08/08/2024)  Transportation Needs: No Transportation Needs (08/08/2024)  Utilities: Not At Risk (08/08/2024)  Social Connections: Socially Isolated (08/08/2024)  Tobacco Use: Medium Risk (08/08/2024)   SDOH Interventions:     Readmission Risk Interventions    12/08/2023   10:22 AM 12/03/2023   10:44 AM 10/09/2023    3:26 PM  Readmission Risk Prevention Plan  Transportation Screening  Complete Complete  PCP or Specialist Appt within 5-7 Days   Complete  PCP or Specialist Appt within 3-5 Days Complete    Home Care Screening   Complete  Medication Review (RN CM)   Complete  HRI or Home Care  Consult Complete Complete   Social Work Consult for Recovery Care Planning/Counseling  Complete   Palliative Care Screening Complete Not Applicable   Medication Review Oceanographer) Complete Complete

## 2024-08-09 NOTE — Assessment & Plan Note (Addendum)
 Hypotension: Consider starting home midodrine  10 mg BID if BP drops with diuresis. BPH: Restarting home Flomax  0.8 mg daily Anemia: Stable, continue to monitor. Cancer history: Lung cancer s/p resection, cholangiocarcinoma, CML; taking Dasatinib  (chemo), though no fills since 07/2023 per med rec.

## 2024-08-09 NOTE — Assessment & Plan Note (Signed)
 Na of 127, stable. - Continue po

## 2024-08-09 NOTE — Procedures (Signed)
 PROCEDURE SUMMARY:  Successful image-guided diagnostic and therapeutic thoracentesis from the right chest.  Yielded 800 milliliters of serosanguinous fluid.  No immediate complications.  EBL: < 2 mL Patient tolerated well.   Specimen sent for labs.  Post-procedure CXR ordered and reviewed. No pneumothorax seen.   Please see imaging section of Epic for full dictation.  Sakinah Rosamond NP 08/09/2024 4:33 PM

## 2024-08-09 NOTE — Assessment & Plan Note (Addendum)
 Regular alcohol use with last drink 11/15. Hepatic function panel today showing elevated liver enzymes, elevated bili and direct bili. CT AP with no significant liver findings noted; no biliary duct dilation and status postcholecystectomy. Clinically stable. - CIWAs ordered without Ativan ; 0s so far - Folic acid , thiamine , multivitamin - Will continue to monitor; diuresing as above

## 2024-08-09 NOTE — Consult Note (Signed)
 NAME:  Kyle Montoya, MRN:  [id number], DOB:  Apr 29, 1951, LOS: 1 ADMISSION DATE:  08/08/2024, CONSULTATION DATE:  11/17 REFERRING MD:  Dr McDiarmid, CHIEF COMPLAINT:  SOB   History of Present Illness:  73 year old male with past medical history as below, which is significant for COPD, CHF, alcohol abuse, dysphagia status post PEG in May of this year, lung cancer status post resection, cholangiocarcinoma (WHIPPLE), and CML on Dasatinib .  He presented to [facility] emergency department 11/16 with complaints of shortness of breath and nonproductive cough x 3 days.  Evaluation in the emergency department included CT angiogram of the chest which was negative for PE. It did show bilateral pleural effusions R>L as well as a left lower lobe pulmonary nodule. He was admitted by the [facility] medicine teaching service for treatment of COPD exacerbation CHF exacerbation. Oncology called regarding pulmonary and adrenal nodule. PCCM consult was recommended for further recommendations.   Pertinent  Medical History   has a past medical history of Abdominal pain (09/13/2022), Abnormal colonoscopy (02/23/2021), Abnormal transaminases (02/27/2022), Acute diastolic (congestive) heart failure (HCC) (10/08/2023), Acute on chronic diastolic CHF (congestive heart failure) (HCC) (02/08/2024), Adenomatous polyp of descending colon (02/23/2021), Adverse drug reaction, initial encounter (07/04/2016), AKI (acute kidney injury) (12/02/2023), Alcohol abuse (12/02/2023), Alcohol withdrawal (HCC) (02/08/2024), Anemia in chronic illness (01/22/2013), Arcus senilis of both eyes (04/05/2022), Arthritis, degenerative (01/31/2015), Ascending aortic aneurysm (06/27/2022), Atherosclerosis of coronary artery without angina pectoris (02/19/2024), Benign fibroma of prostate (01/31/2015), Bowel sounds hyperactive (09/13/2022), BP (high blood pressure) (01/22/2013), BPH (benign prostatic hyperplasia), CAD (coronary artery disease), Cannot sleep  (01/31/2015), Carcinoma of biliary tract (HCC) (02/15/2013), Cataract, Cervical osteoarthritis (10/18/2014), Cholangiocarcinoma of biliary tract (HCC), Chronic cough (01/31/2015), Chronic myelogenous leukemia (HCC), Chronic pain (01/31/2015), CML (chronic myelocytic leukemia) (HCC) (10/18/2014), Cognitive communication disorder (02/19/2024), Compulsive tobacco user syndrome (01/31/2015), Congestive heart failure (CHF) (HCC) (09/26/2023), Constipation (09/13/2022), COPD (chronic obstructive pulmonary disease) (HCC), COPD with asthma (HCC) (01/22/2013), Diuretic-induced hypokalemia (05/23/2023), DJD (degenerative joint disease), Dyslipidemia, Dyspnea (09/18/2023), Edema (10/01/2023), Electrocardiogram abnormal (08/23/2022), Encounter for examination of eyes and vision without abnormal findings (08/23/2022), Family history of colonic polyps, Family history of prostate cancer, Full thickness rotator cuff tear (08/08/2020), Gastroesophageal reflux disease without esophagitis (02/19/2024), Gastrostomy present (HCC) (02/20/2024), Gout, High coronary artery calcium score (05/30/2023), History of colon polyps (02/23/2021), History of colonic polyps (10/01/2021), HLD (hyperlipidemia) (01/31/2015), HTN (hypertension), Hypercalcemia (08/10/2020), Hypercholesterolemia, Hypocalcemia (08/27/2021), Hypokalemia (12/02/2023), Hyponatremia (09/19/2023), Hypotension (12/02/2023), Impingement syndrome of both shoulders (03/31/2020), Iron  deficiency anemia due to chronic blood loss (03/12/2024), Lung cancer (HCC), Malnutrition of moderate degree (02/11/2024), Metabolic acidosis (12/02/2023), Mild intermittent asthma (02/03/2024), Muscle weakness (02/20/2024), Myelogenous leukemia (HCC), Need for assistance with personal care (02/20/2024), Non-small cell carcinoma of lung (HCC) (01/31/2015), OA (osteoarthritis), Oropharyngeal dysphagia (02/19/2024), Orthostatic hypotension (10/10/2023), Other dietary vitamin B12 deficiency anemia  (10/17/2023), Other specified health status (08/23/2022), PAD (peripheral artery disease), Personal history of gastric ulcers (02/23/2021), Pleural effusion (10/31/2023), Pneumonia (09/18/2023), Pneumonitis due to inhalation of regurgitated food (HCC) (02/19/2024), Proteinuria (08/23/2022), Reduced mobility (02/20/2024), Renal insufficiency, Right lower lobe pneumonia (02/05/2024), Shock (HCC) (12/02/2023), Shoulder pain, left (03/29/2020), Shoulder pain, right (03/29/2020), Tendonitis, Thoracic aortic aneurysm (06/27/2022), Tobacco use (08/23/2022), Type 2 diabetes mellitus with hyperlipidemia (HCC), Unsteadiness on feet (02/20/2024), Vitamin D  deficiency (02/03/2024), and Wheezing (01/31/2015).   Significant Hospital Events: Including procedures, antibiotic start and stop dates in addition to other pertinent events   11/16 admit   Interim History / Subjective:  Breathing  not great presently. No worse than it has  been, just not getting better. Non-productive cough. No fevers or chills. No sick contacts. No lower extremity edema. No hemoptysis.   Objective    Blood pressure 120/72, pulse 82, temperature (!) 97.5 F (36.4 C), temperature source Oral, resp. rate (!) 21, height 5' 11 (1.803 m), weight 97.5 kg, SpO2 97%.        Intake/Output Summary (Last 24 hours) at 08/09/2024 1400 Last data filed at 08/09/2024 9071 Gross per 24 hour  Intake 120 ml  Output 400 ml  Net -280 ml   Filed Weights   08/08/24 0928 08/08/24 1735 08/09/24 0405  Weight: 90.7 kg 98.7 kg 97.5 kg    Examination:  General: elderly male in NAD eating his lunch HENT: [place]/AT, PERRL, no JVD Lungs: Unlabored. Diminished bases Cardiovascular: RRR, no MRG Abdomen: Soft, NT, ND Extremities: No acute deformity or ROM limitation Neuro: Alert, oriented, non-focal   Echo LVEF 55-60%, LVH. RV systolic function mildly reduced. RV severely enlarged. Moderate aortic regurg. Trivial pericardial effusion. IVC dilated.    Venous dopplers >  CTPE > No PE, enlarging right pleural effusion. New small L pleural effusion. LLL 1.2x1.3 solid pulmonary nodule.   CT abdomen: stable post op appearance. Stable 1.2cm left adrenal nodule.      Resolved problem list    Assessment and Plan   Acute exacerbation of COPD: no PFT in our system since 2016.  - Agree with a brief course of steroids - Would escalate to triple neb therapy while inpatient.  - Hold home Trellegy for now - Azithromycin  per primary  Bilateral pleural effusions: R>L s/p right sided thoracentesis 11/17 - pleural fluid studies pending - Dasatinib  is known to cause effusions, so this is what they have been attributed to in the past.  - Agree with diuresis. Lower extremity edema seems to have improved. Left sided effusion showed interval improvement making me believe it is more likely due to CHF.   Pulmonary nodules;  - primary team has discussed with oncology. No real concern for CML progression.  - Will need to arrange pulmonary follow-up upon discharge for repeat imaging.  CML Dysphagia - per primary   Labs   CBC: Recent Labs  Lab 08/08/24 0930 08/09/24 0315  WBC 7.4 4.3  HGB 10.9* 9.7*  HCT 31.9* 28.2*  MCV 87.2 87.0  PLT 102* 106*    Basic Metabolic Panel: Recent Labs  Lab 08/08/24 0930 08/09/24 0315  NA 128* 127*  K 3.5 3.6  CL 97* 96*  CO2 19* 20*  GLUCOSE 119* 284*  BUN 9 11  CREATININE 0.71 0.84  CALCIUM 7.5* 7.2*  MG  --  1.8   GFR: Estimated Creatinine Clearance: 91.9 mL/min (by C-G formula based on SCr of 0.84 mg/dL). Recent Labs  Lab 08/08/24 0930 08/09/24 0315  WBC 7.4 4.3    Liver Function Tests: Recent Labs  Lab 08/09/24 0315  AST 438*  ALT 383*  ALKPHOS 416*  BILITOT 2.9*  PROT 5.6*  ALBUMIN  2.2*   No results for input(s): LIPASE, AMYLASE in the last 168 hours. No results for input(s): AMMONIA in the last 168 hours.  ABG    Component Value Date/Time   HCO3 24.1  02/05/2024 1554   TCO2 25 02/05/2024 1554   ACIDBASEDEF 1.0 02/05/2024 1554   O2SAT 54 02/05/2024 1554     Coagulation Profile: No results for input(s): INR, PROTIME in the last 168 hours.  Cardiac Enzymes: No results for input(s): CKTOTAL, CKMB, CKMBINDEX, TROPONINI in the last 168  hours.  HbA1C: Hemoglobin A1C  Date/Time Value Ref Range Status  12/13/2022 12:00 AM 4.5  Final   Hgb A1c MFr Bld  Date/Time Value Ref Range Status  02/06/2024 03:44 AM 4.6 (L) 4.8 - 5.6 % Final    Comment:    (NOTE) Pre diabetes:          5.7%-6.4%  Diabetes:              >6.4%  Glycemic control for   <7.0% adults with diabetes   09/25/2020 09:17 AM 4.7 (L) 4.8 - 5.6 % Final    Comment:    (NOTE) Pre diabetes:          5.7%-6.4%  Diabetes:              >6.4%  Glycemic control for   <7.0% adults with diabetes     CBG: No results for input(s): GLUCAP in the last 168 hours.  Review of Systems:    Bolds are positive  Constitutional: weight loss, gain, night sweats, Fevers, chills, fatigue .  HEENT: headaches, Sore throat, sneezing, nasal congestion, post nasal drip, Difficulty swallowing, Tooth/dental problems, visual complaints visual changes, ear ache CV:  chest pain, radiates:,Orthopnea, PND, swelling in lower extremities, dizziness, palpitations, syncope.  GI  heartburn, indigestion, abdominal pain, nausea, vomiting, diarrhea, change in bowel habits, loss of appetite, bloody stools.  Resp: cough, nonproductive: , hemoptysis, dyspnea, chest pain, pleuritic.  Skin: rash or itching or icterus GU: dysuria, change in color of urine, urgency or frequency. flank pain, hematuria  MS: joint pain or swelling. decreased range of motion  Psych: change in mood or affect. depression or anxiety.  Neuro: difficulty with speech, weakness, numbness, ataxia    Past Medical History:  He,  has a past medical history of Abdominal pain (09/13/2022), Abnormal colonoscopy (02/23/2021),  Abnormal transaminases (02/27/2022), Acute diastolic (congestive) heart failure (HCC) (10/08/2023), Acute on chronic diastolic CHF (congestive heart failure) (HCC) (02/08/2024), Adenomatous polyp of descending colon (02/23/2021), Adverse drug reaction, initial encounter (07/04/2016), AKI (acute kidney injury) (12/02/2023), Alcohol abuse (12/02/2023), Alcohol withdrawal (HCC) (02/08/2024), Anemia in chronic illness (01/22/2013), Arcus senilis of both eyes (04/05/2022), Arthritis, degenerative (01/31/2015), Ascending aortic aneurysm (06/27/2022), Atherosclerosis of coronary artery without angina pectoris (02/19/2024), Benign fibroma of prostate (01/31/2015), Bowel sounds hyperactive (09/13/2022), BP (high blood pressure) (01/22/2013), BPH (benign prostatic hyperplasia), CAD (coronary artery disease), Cannot sleep (01/31/2015), Carcinoma of biliary tract (HCC) (02/15/2013), Cataract, Cervical osteoarthritis (10/18/2014), Cholangiocarcinoma of biliary tract (HCC), Chronic cough (01/31/2015), Chronic myelogenous leukemia (HCC), Chronic pain (01/31/2015), CML (chronic myelocytic leukemia) (HCC) (10/18/2014), Cognitive communication disorder (02/19/2024), Compulsive tobacco user syndrome (01/31/2015), Congestive heart failure (CHF) (HCC) (09/26/2023), Constipation (09/13/2022), COPD (chronic obstructive pulmonary disease) (HCC), COPD with asthma (HCC) (01/22/2013), Diuretic-induced hypokalemia (05/23/2023), DJD (degenerative joint disease), Dyslipidemia, Dyspnea (09/18/2023), Edema (10/01/2023), Electrocardiogram abnormal (08/23/2022), Encounter for examination of eyes and vision without abnormal findings (08/23/2022), Family history of colonic polyps, Family history of prostate cancer, Full thickness rotator cuff tear (08/08/2020), Gastroesophageal reflux disease without esophagitis (02/19/2024), Gastrostomy present (HCC) (02/20/2024), Gout, High coronary artery calcium score (05/30/2023), History of colon polyps  (02/23/2021), History of colonic polyps (10/01/2021), HLD (hyperlipidemia) (01/31/2015), HTN (hypertension), Hypercalcemia (08/10/2020), Hypercholesterolemia, Hypocalcemia (08/27/2021), Hypokalemia (12/02/2023), Hyponatremia (09/19/2023), Hypotension (12/02/2023), Impingement syndrome of both shoulders (03/31/2020), Iron  deficiency anemia due to chronic blood loss (03/12/2024), Lung cancer (HCC), Malnutrition of moderate degree (02/11/2024), Metabolic acidosis (12/02/2023), Mild intermittent asthma (02/03/2024), Muscle weakness (02/20/2024), Myelogenous leukemia (HCC), Need for assistance with personal care (02/20/2024), Non-small cell carcinoma  of lung (HCC) (01/31/2015), OA (osteoarthritis), Oropharyngeal dysphagia (02/19/2024), Orthostatic hypotension (10/10/2023), Other dietary vitamin B12 deficiency anemia (10/17/2023), Other specified health status (08/23/2022), PAD (peripheral artery disease), Personal history of gastric ulcers (02/23/2021), Pleural effusion (10/31/2023), Pneumonia (09/18/2023), Pneumonitis due to inhalation of regurgitated food (HCC) (02/19/2024), Proteinuria (08/23/2022), Reduced mobility (02/20/2024), Renal insufficiency, Right lower lobe pneumonia (02/05/2024), Shock (HCC) (12/02/2023), Shoulder pain, left (03/29/2020), Shoulder pain, right (03/29/2020), Tendonitis, Thoracic aortic aneurysm (06/27/2022), Tobacco use (08/23/2022), Type 2 diabetes mellitus with hyperlipidemia (HCC), Unsteadiness on feet (02/20/2024), Vitamin D  deficiency (02/03/2024), and Wheezing (01/31/2015).   Surgical History:   Past Surgical History:  Procedure Laterality Date   BIOPSY  04/30/2021   Procedure: BIOPSY;  Surgeon: Wilhelmenia Aloha Raddle., MD;  Location: [facility];  Service: Gastroenterology;;   Bowel duct surg  2014   Bowel cancer   CARDIAC CATHETERIZATION  2000   CHOLECYSTECTOMY     COLONOSCOPY  06/23/2015   mild sigmoid diverticulosis. small external and internal hemorrhoids. otherwise  normal colonoscopy to terminal ileum   COLONOSCOPY WITH PROPOFOL  N/A 04/30/2021   Procedure: COLONOSCOPY WITH PROPOFOL ;  Surgeon: Mansouraty, Aloha Raddle., MD;  Location: [facility];  Service: Gastroenterology;  Laterality: N/A;   ENDOSCOPIC MUCOSAL RESECTION N/A 04/30/2021   Procedure: ENDOSCOPIC MUCOSAL RESECTION;  Surgeon: Wilhelmenia Aloha Raddle., MD;  Location: [facility];  Service: Gastroenterology;  Laterality: N/A;   ESOPHAGOGASTRODUODENOSCOPY (EGD) WITH PROPOFOL  N/A 04/30/2021   Procedure: ESOPHAGOGASTRODUODENOSCOPY (EGD) WITH PROPOFOL ;  Surgeon: Wilhelmenia Aloha Raddle., MD;  Location: [facility];  Service: Gastroenterology;  Laterality: N/A;   HEMOSTASIS CLIP PLACEMENT  04/30/2021   Procedure: HEMOSTASIS CLIP PLACEMENT;  Surgeon: Wilhelmenia Aloha Raddle., MD;  Location: [facility];  Service: Gastroenterology;;   IR GASTROSTOMY TUBE MOD SED  02/17/2024   LUNG CANCER SURGERY  09/2014   partial lobectomy   POLYPECTOMY  04/30/2021   Procedure: POLYPECTOMY;  Surgeon: Mansouraty, Aloha Raddle., MD;  Location: [facility];  Service: Gastroenterology;;   SHOULDER OPEN ROTATOR CUFF REPAIR Right 09/27/2020   Procedure: Right shoulder mini open rotator cuff repair, distal clavicle resection;  Surgeon: Duwayne Purchase, MD;  Location: [facility];  Service: Orthopedics;  Laterality: Right;  90 mins  Choice with Block anesthesia   SUBMUCOSAL LIFTING INJECTION  04/30/2021   Procedure: SUBMUCOSAL LIFTING INJECTION;  Surgeon: Wilhelmenia Aloha Raddle., MD;  Location: [facility];  Service: Gastroenterology;;   UPPER GASTROINTESTINAL ENDOSCOPY     WHIPPLE PROCEDURE  02/25/2013   Procedure: WHIPPLE PROCEDURE; Surgeon: Abran Kallman, MD; Location[facility] OR; Service: General; Laterality: N/A;     Social History:   reports that he has quit smoking. His smoking use included cigarettes. He has a 10 pack-year smoking history. He has never used smokeless tobacco. He reports current alcohol use of about 21.0  standard drinks of alcohol per week. He reports that he does not use drugs.   Family History:  His family history includes Asthma in his sister; Cancer in his half-sister and paternal grandfather; Hypertension in his brother, mother, and sister; Prostate cancer (age of onset: 25) in his father. There is no history of Esophageal cancer, Colon polyps, Colon cancer, Stomach cancer, Rectal cancer, Inflammatory bowel disease, Liver disease, or Pancreatic cancer.   Allergies Allergies  Allergen Reactions   Pneumococcal Vaccines Swelling     Home Medications  Prior to Admission medications   Medication Sig Start Date End Date Taking? Authorizing Provider  albuterol  (PROVENTIL  HFA;VENTOLIN  HFA) 108 (90 BASE) MCG/ACT inhaler Inhale 2 puffs into the lungs every 6 (  six) hours as needed for wheezing or shortness of breath.   Yes [provider]  albuterol  (PROVENTIL ) (2.5 MG/3ML) 0.083% nebulizer solution Take 2.5 mg by nebulization every 4 (four) hours as needed for wheezing or shortness of breath. 07/19/24  Yes [provider]  allopurinol  (ZYLOPRIM ) 300 MG tablet Place 1 tablet (300 mg total) into feeding tube daily. Patient taking differently: Take 300 mg by mouth daily. 02/18/24  Yes Fairy Frames, MD  ascorbic acid (VITAMIN C) 500 MG tablet Take 500 mg by mouth daily.   Yes [provider]  aspirin  EC 81 MG tablet Take 1 tablet (81 mg total) by mouth daily. Swallow whole. 02/18/24  Yes Fairy Frames, MD  Calcium Carb-Cholecalciferol (CALCIUM 600+D3) 600-5 MG-MCG TABS Take 2 tablets by mouth daily.   Yes [provider]  Cholecalciferol (VITAMIN D ) 50 MCG (2000 UT) tablet Place 1 tablet (2,000 Units total) into feeding tube daily. Patient taking differently: Take 2,000 Units by mouth daily. 02/18/24  Yes Fairy Frames, MD  dasatinib  (SPRYCEL ) 100 MG tablet Place 1 tablet (100 mg total) into feeding tube daily. Patient taking differently: Take 100 mg by mouth  daily. 02/18/24  Yes Fairy Frames, MD  famotidine  (PEPCID ) 40 MG tablet Place 1 tablet (40 mg total) into feeding tube daily. Patient taking differently: Take 40 mg by mouth daily. 02/18/24  Yes Fairy Frames, MD  Ferrous Sulfate 28 MG TABS Take 1 tablet by mouth daily.   Yes [provider]  folic acid  (FOLVITE ) 1 MG tablet Place 1 tablet (1 mg total) into feeding tube daily. Patient taking differently: Take 1 mg by mouth daily. 02/18/24  Yes Fairy Frames, MD  methocarbamol  (ROBAXIN ) 750 MG tablet Place 1 tablet (750 mg total) into feeding tube every 6 (six) hours as needed for muscle spasms. Patient taking differently: Take 750 mg by mouth every 6 (six) hours as needed for muscle spasms. 02/18/24  Yes Fairy Frames, MD  midodrine  (PROAMATINE ) 5 MG tablet Place 2 tablets (10 mg total) into feeding tube 2 (two) times daily with a meal. Patient taking differently: Take 10 mg by mouth 2 (two) times daily with a meal. 02/18/24  Yes Fairy Frames, MD  Multiple Vitamin (MULTIVITAMIN WITH MINERALS) TABS tablet Place 1 tablet into feeding tube daily. Patient taking differently: Take 1 tablet by mouth daily. 02/18/24  Yes Fairy Frames, MD  potassium chloride  SA (KLOR-CON  M) 20 MEQ tablet Take 20 mEq by mouth 2 (two) times daily.   Yes [provider]  tamsulosin  (FLOMAX ) 0.4 MG CAPS capsule Take 2 capsules (0.8 mg total) by mouth daily. 02/18/24  Yes Fairy Frames, MD  torsemide  (DEMADEX ) 20 MG tablet Take 20 mg by mouth daily.   Yes [provider]  TRELEGY ELLIPTA 100-62.5-25 MCG/INH AEPB Inhale 1 puff into the lungs daily as needed (Asthma). 12/20/20  Yes [provider]     Time spent for care: 46 minutes     Deward Eastern, AGACNP-BC [facility]  See Amion for personal pager PCCM on call pager [phone] until 7pm. Please call Elink 7p-7a. [phone]  08/09/2024 2:53 PM

## 2024-08-09 NOTE — Progress Notes (Signed)
 Patient off the floor both times I tried to see for consult today. Will see in AM.  Leita SHAUNNA Gaskins, DO 08/09/24 7:16 PM [facility]  For contact information, see Amion. If no response to pager, please call PCCM consult pager. After hours, 7PM- 7AM, please call Elink.

## 2024-08-09 NOTE — Progress Notes (Signed)
 Modified Barium Swallow Study  Patient Details  Name: Kyle Montoya MRN: [id number] Date of Birth: 1951-05-23  Today's Date: 08/09/2024  Modified Barium Swallow completed.  Full report located under Chart Review in the Imaging Section.  History of Present Illness Kyle Montoya is a 73 y.o. male with PMH of CHF(?), COPD, EtOH abuse, hx dysphagia s/p PEG (01/2024), 2022 EGD distal esophagus was moderately tortuous, history of multiple cancers (lung cancer sp resection, CML, cholangiocarcinoma), HTN, ascending aortic aneurysm, IDA presenting with cough and trouble breathing. Diagnosed with COPD exacerbation, infection/aspiration pna. MBS 5/19 with structural abnormalities, osteophytes (fusaion from C2-C5 anteriorly due to flowing osteophytes form cervical spine CT)  with aspiration of thin, coughed but unproductive to eject aspirates. Rec NPO, no improvement with follow up sessions and received PEG with sips water  recommeneded. Pt today reports I never stopped eating and currently PEG is in place. Scheduled for outpatient MBS but was no show and stated he didn't feel like coming.   Clinical Impression Swallow function has improved compared to [facility] 01/2024 however he continues to exhibit moderate pharyngoesophageal dysphagia. Of note SLP observed areas of calcification below vocal cords. Dysphagia is chronic in nature primarily impacted by previously diagnosed large flowing anterior osteophytes (fusion from C2-C5). Epiglottic inversion was partial to full dependent on bolus viscocity and  volume. Primary concern is vallecular and pyriform sinus residue ranging from mild-max due to reduced tongue base retraction, pharyngeal constriction and partial PES opening with early closure. Residue spilled from vallecuale with trace aspiration slightly falling below cords silently (PAS 8) and cue to cough cleared and frank penetration from vallecular residue with nectar intermittently sensing, not fully clearing  with spontaneous or cues to throat clear/cough. Multiple swallows ejected penetrates with nectar and were only inconsistently effective at reducing residue with risk for potential aspiration significant. Educated pt re: results and potential outcome with pt who stated he wants to continue to eat/drink with risks. Discussed with MD who was in agreement with pt's choice. Son was educated who verbalized understanding and also stated overall decline including swallow function over past several weeks as his alcohol intake increased, eating laying down/slumped, decreased intake, increased weakness and dificulty breathing. Continue regular texture, thin liquids, recommend crush pills, multiple swallows, cough following liquids and alternate liquids and solids. Suggested Palliative care consult due to chronic nature of dysphagia and potential for readmission with pneumonias. ST will follow up for strategies. Due to chronic nature potential for improvement appears guarded however may consider exercises to facilitate tongue base and pharyngeal contraction to compensate during times of deconditioning. Factors that may increase risk of adverse event in presence of aspiration Noe & Lianne 2021):  (large cervical osteophytes)      Swallow Evaluation Recommendations Recommendations: PO diet PO Diet Recommendation: Regular;Thin liquids (Level 0) (pt wants to eat despite risks- discussed with MD and son) Liquid Administration via: Cup;Straw Medication Administration: Crushed with puree Supervision: Patient able to self-feed;Full supervision/cueing for swallowing strategies Swallowing strategies  : Slow rate;Small bites/sips;Multiple dry swallows after each bite/sip;Follow solids with liquids;Hard cough after swallowing Postural changes: Position pt fully upright for meals Oral care recommendations: Oral care BID (2x/day)      Dustin Olam Bull 08/09/2024,4:59 PM

## 2024-08-09 NOTE — TOC CAGE-AID Note (Signed)
 Transition of Care [facility]) - CAGE-AID Screening   Patient Details  Name: Kyle Montoya MRN: [id number] Date of Birth: 12-23-1950  Transition of Care [facility]) CM/SW Contact:    Luise JAYSON Pan, LCSWA Phone Number: 08/09/2024, 11:52 AM   Clinical Narrative: CSW adderssed substance use consult at bedside. Patient stated he drinks 6 packs of beer per day. Patient does not have any concerns for his drinking. Patient reported he is fine and feels safe. Patient declined resources for substance use at this time.   CAGE-AID Screening:    Have You Ever Felt You Ought to Cut Down on Your Drinking or Drug Use?: No Have People Annoyed You By Critizing Your Drinking Or Drug Use?: Yes Have You Felt Bad Or Guilty About Your Drinking Or Drug Use?: No Have You Ever Had a Drink or Used Drugs First Thing In The Morning to Steady Your Nerves or to Get Rid of a Hangover?: No CAGE-AID Score: 1  Substance Abuse Education Offered: Yes  Substance abuse interventions: Transport Planner, Other (must comment) (Patient declined resources)

## 2024-08-09 NOTE — Assessment & Plan Note (Addendum)
 Hx of dysphagia leading to PEG tube placement in 01/2024; tube still present. Patient po-ing fine. - SLP seen, recommending MBS; scheduled for 12pm today (okay to keep current Regular diet until this) - Consider RD consult following - Keep PEG tube in place for now pending repeat barium swallow

## 2024-08-09 NOTE — Assessment & Plan Note (Signed)
 R>L lower extremity edema.  Given robust cancer history, ruling out clot. - Bilateral DVT US  pending

## 2024-08-09 NOTE — Assessment & Plan Note (Addendum)
 On chart review, concerns for memory impairment per family in prior notes. - Consider dementia workup with labs, CT head later this hospitalization (given poor outpatient follow-up)

## 2024-08-09 NOTE — Plan of Care (Signed)
 Reached out to Dr. Chinita Patten, oncology, via Secure Chat concerning this patient's need for further oncology recs and possible need for pulm eval. He recommended pulmonology consult for lung nodule. He had reviewed imaging and did not see other evidence of malignancy.  No concern for CML progression based on labs.  He did not recommend restarting Dasatinib , especially given this could cause pleural effusions and patient may or may not have been taking it.  Pt's outpatient oncologist in [place] was also considering switching treatments if patient had persistent pleural effusions.  Dr. Pasam recommended considering switching treatments in the outpatient oncology setting. Appreciate oncology's time in reviewing this patient and recommendations.  Will consider recall consult pending possible lung nodule workup results. Will plan to consult pulmonology.

## 2024-08-09 NOTE — Progress Notes (Signed)
 Echo attempted at 8:45, patient is with speech therapy. Will reattempt as schedule permits.  [facility] Tressa Maldonado RDCS

## 2024-08-09 NOTE — Evaluation (Signed)
 Clinical/Bedside Swallow Evaluation Patient Details  Name: Kyle Montoya MRN: [id number] Date of Birth: Jan 29, 1951  Today's Date: 08/09/2024 Time: SLP Start Time (ACUTE ONLY): 0845 SLP Stop Time (ACUTE ONLY): 0900 SLP Time Calculation (min) (ACUTE ONLY): 15 min  Past Medical History:  Past Medical History:  Diagnosis Date   Abdominal pain 09/13/2022   Unspecified abdominal pain     Abnormal colonoscopy 02/23/2021   Abnormal transaminases 02/27/2022   Acute diastolic (congestive) heart failure (HCC) 10/08/2023   Acute on chronic diastolic CHF (congestive heart failure) (HCC) 02/08/2024   Adenomatous polyp of descending colon 02/23/2021   Adverse drug reaction, initial encounter 07/04/2016   07/02/2016: Prevnar 23 vaccine given in right arm  07/02/2016: localized swelling  10/11/20167: Increase in swelling from shoulder to inner aspect of elbow.Red, warm and firm to touch.  No lip or airway swelling or stridor.  2+ brachial and radial pulses right arm.  Capillary refill < 2 seconds     AKI (acute kidney injury) 12/02/2023   Alcohol abuse 12/02/2023   Alcohol withdrawal (HCC) 02/08/2024   Anemia in chronic illness 01/22/2013   Arcus senilis of both eyes 04/05/2022   Arcus senilis, bilateral     Arthritis, degenerative 01/31/2015   Ascending aortic aneurysm 06/27/2022   Atherosclerosis of coronary artery without angina pectoris 02/19/2024   Benign fibroma of prostate 01/31/2015   Bowel sounds hyperactive 09/13/2022   Hyperactive bowel sounds     BP (high blood pressure) 01/22/2013   BPH (benign prostatic hyperplasia)    CAD (coronary artery disease)    Cannot sleep 01/31/2015   Carcinoma of biliary tract (HCC) 02/15/2013   Diagnosed in 2014, Pt had whipple done at [facility], under care by Dr. Abran Kallman Oncologist, has f/u every 6-12 months.   Cataract    bilateral sx   Cervical osteoarthritis 10/18/2014   Cholangiocarcinoma of biliary tract (HCC)    Chronic cough 01/31/2015    Chronic myelogenous leukemia (HCC)    Chronic pain 01/31/2015   CML (chronic myelocytic leukemia) (HCC) 10/18/2014   Overview:   Follows with [facility], Dr. Cornelius. On dasatinib , last WBC 9.6.     Cognitive communication disorder 02/19/2024   Compulsive tobacco user syndrome 01/31/2015   Congestive heart failure (CHF) (HCC) 09/26/2023   Constipation 09/13/2022   Constipation, unspecified     COPD (chronic obstructive pulmonary disease) (HCC)    uses inhaler   COPD with asthma (HCC) 01/22/2013   Severe obstruction on spirometry FeV1 54% FeV1/FVC 51% 01/31/2015  CXR NAD 01/25/15     Diuretic-induced hypokalemia 05/23/2023   DJD (degenerative joint disease)    Dyslipidemia    Dyspnea 09/18/2023   Shortness of breath     Edema 10/01/2023   Edema, unspecified     Electrocardiogram abnormal 08/23/2022   Abnormal electrocardiogram [ECG] [EKG]     Encounter for examination of eyes and vision without abnormal findings 08/23/2022   Encounter for exam of eyes and vision w/o abnormal findingsEncounter for exam of eyes and vision w/o abnormal findings; Start Date : 04/05/2022     Family history of colonic polyps    Family history of prostate cancer    Full thickness rotator cuff tear 08/08/2020   Gastroesophageal reflux disease without esophagitis 02/19/2024   Gastrostomy present (HCC) 02/20/2024   Gout    High coronary artery calcium score 05/30/2023   History of colon polyps 02/23/2021   History of colonic polyps 10/01/2021   IMO SNOMED Dx Update Oct 2024  HLD (hyperlipidemia) 01/31/2015   HTN (hypertension)    on meds   Hypercalcemia 08/10/2020   Hypercholesterolemia    Hypocalcemia 08/27/2021   Hypokalemia 12/02/2023   Hyponatremia 09/19/2023   Hypotension 12/02/2023   Impingement syndrome of both shoulders 03/31/2020   Iron  deficiency anemia due to chronic blood loss 03/12/2024   Lung cancer (HCC)    Malnutrition of moderate degree 02/11/2024   Metabolic  acidosis 12/02/2023   Mild intermittent asthma 02/03/2024   Muscle weakness 02/20/2024   Myelogenous leukemia (HCC)    Need for assistance with personal care 02/20/2024   Non-small cell carcinoma of lung (HCC) 01/31/2015   02/13/2015 CT Chest no evidence of recurrence in lung cancer  Stage I adenocarcinoma diagnosed with left upper lobectomy in February 2016     OA (osteoarthritis)    Oropharyngeal dysphagia 02/19/2024   Orthostatic hypotension 10/10/2023   Other dietary vitamin B12 deficiency anemia 10/17/2023   Other dietary vitamin B12 deficiency anemiaOther dietary vitamin B12 deficiency anemia; Start Date : 11/01/2024Other dietary vitamin B12 deficiency anemia; Start Date : 09/19/2024Other dietary vitamin B12 deficiency anemia; Start Date : 08/23/2024Other dietary vitamin B12 deficiency anemia; Start Date : 07/01/2024Other dietary vitamin B12 deficiency anemia; Start Date : 06/03/2024Other dieta   Other specified health status 08/23/2022   Other specified health status     PAD (peripheral artery disease)    Personal history of gastric ulcers 02/23/2021   Pleural effusion 10/31/2023   Pleural effusion, not elsewhere classifiedPleural effusion, not elsewhere classified; Start Date : 01/24/2025Pleural effusion, not elsewhere classified; Start Date : 09/18/2023     Pneumonia 09/18/2023   Pneumonia, unspecified organism     Pneumonitis due to inhalation of regurgitated food (HCC) 02/19/2024   Proteinuria 08/23/2022   Proteinuria, unspecified     Reduced mobility 02/20/2024   Renal insufficiency    Right lower lobe pneumonia 02/05/2024   Shock (HCC) 12/02/2023   Shoulder pain, left 03/29/2020   Shoulder pain, right 03/29/2020   Tendonitis    patellar   Thoracic aortic aneurysm 06/27/2022   Tobacco use 08/23/2022   Tobacco use     Type 2 diabetes mellitus with hyperlipidemia (HCC)    Unsteadiness on feet 02/20/2024   Vitamin D  deficiency 02/03/2024   Wheezing 01/31/2015    Past Surgical History:  Past Surgical History:  Procedure Laterality Date   BIOPSY  04/30/2021   Procedure: BIOPSY;  Surgeon: Wilhelmenia Aloha Raddle., MD;  Location: [facility];  Service: Gastroenterology;;   Bowel duct surg  2014   Bowel cancer   CARDIAC CATHETERIZATION  2000   CHOLECYSTECTOMY     COLONOSCOPY  06/23/2015   mild sigmoid diverticulosis. small external and internal hemorrhoids. otherwise normal colonoscopy to terminal ileum   COLONOSCOPY WITH PROPOFOL  N/A 04/30/2021   Procedure: COLONOSCOPY WITH PROPOFOL ;  Surgeon: Wilhelmenia Aloha Raddle., MD;  Location: [facility];  Service: Gastroenterology;  Laterality: N/A;   ENDOSCOPIC MUCOSAL RESECTION N/A 04/30/2021   Procedure: ENDOSCOPIC MUCOSAL RESECTION;  Surgeon: Wilhelmenia Aloha Raddle., MD;  Location: [facility];  Service: Gastroenterology;  Laterality: N/A;   ESOPHAGOGASTRODUODENOSCOPY (EGD) WITH PROPOFOL  N/A 04/30/2021   Procedure: ESOPHAGOGASTRODUODENOSCOPY (EGD) WITH PROPOFOL ;  Surgeon: Wilhelmenia Aloha Raddle., MD;  Location: [facility];  Service: Gastroenterology;  Laterality: N/A;   HEMOSTASIS CLIP PLACEMENT  04/30/2021   Procedure: HEMOSTASIS CLIP PLACEMENT;  Surgeon: Wilhelmenia Aloha Raddle., MD;  Location: [facility];  Service: Gastroenterology;;   IR GASTROSTOMY TUBE MOD SED  02/17/2024   LUNG CANCER SURGERY  09/2014  partial lobectomy   POLYPECTOMY  04/30/2021   Procedure: POLYPECTOMY;  Surgeon: Mansouraty, Aloha Raddle., MD;  Location: [facility];  Service: Gastroenterology;;   SHOULDER OPEN ROTATOR CUFF REPAIR Right 09/27/2020   Procedure: Right shoulder mini open rotator cuff repair, distal clavicle resection;  Surgeon: Duwayne Purchase, MD;  Location: [facility];  Service: Orthopedics;  Laterality: Right;  90 mins  Choice with Block anesthesia   SUBMUCOSAL LIFTING INJECTION  04/30/2021   Procedure: SUBMUCOSAL LIFTING INJECTION;  Surgeon: Wilhelmenia Aloha Raddle., MD;  Location: [facility];  Service:  Gastroenterology;;   UPPER GASTROINTESTINAL ENDOSCOPY     WHIPPLE PROCEDURE  02/25/2013   Procedure: WHIPPLE PROCEDURE; Surgeon: Abran Kallman, MD; Location: [facility]; Service: General; Laterality: N/A;   HPI:  Jaan Fischel is a 73 y.o. male with PMH of CHF(?), COPD, EtOH abuse, hx dysphagia s/p PEG (01/2024), 2022 EGD distal esophagus was moderately tortuous, history of multiple cancers (lung cancer sp resection, CML, cholangiocarcinoma), HTN, ascending aortic aneurysm, IDA presenting with cough and trouble breathing. Diagnosed with COPD exacerbation, infection/aspiration pna. MBS 5/19 with structural abnormalities, osteophytes (fusaion from C2-C5 anteriorly due to flowing osteophytes form cervical spine CT)  with aspiration of thin, coughed but unproductive to eject aspirates. Rec NPO, no improvement with follow up sessions and received PEG with sips water  recommeneded. Pt today reports I never stopped eating and currently PEG is in place. Scheduled for outpatient MBS but was no show and stated he didn't feel like coming .    Assessment / Plan / Recommendation  Clinical Impression  Pt with history of dysphagia (impacted by osteophytes) recommended NPO from pior admission, received PEG. Pt stated he has been eating ever since PEG placement. He had baseline cough prior to po's and mild coughing intermittently throughout observation with sips thin and solid from breakfast tray. Also affirmed pharyngeal globus sensation during observation. Reports he sometimes coughs when eating. Masticated solid without dentures timely but states he uses them depending on what he is eating. Dentures donned with use of cream. Given history of aspiration, current pna MBS recommended and pt agreeable to assessment which is scheduled for 12:00. May continue regular/ thin until then. SLP Visit Diagnosis: Dysphagia, unspecified (R13.10)    Aspiration Risk  Moderate aspiration risk    Diet Recommendation Regular;Thin  liquid (until MBS)    Liquid Administration via: Straw;Cup Medication Administration:  (as tolerated) Supervision: Patient able to self feed Postural Changes: Seated upright at 90 degrees    Other  Recommendations Oral Care Recommendations: Oral care BID     Assistance Recommended at Discharge    Functional Status Assessment    Frequency and Duration            Prognosis        Swallow Study   General Date of Onset: 08/09/24 HPI: Maksymilian Mabey is a 73 y.o. male with PMH of CHF(?), COPD, EtOH abuse, hx dysphagia s/p PEG (01/2024), 2022 EGD distal esophagus was moderately tortuous, history of multiple cancers (lung cancer sp resection, CML, cholangiocarcinoma), HTN, ascending aortic aneurysm, IDA presenting with cough and trouble breathing. Diagnosed with COPD exacerbation, infection/aspiration pna. MBS 5/19 with structural abnormalities, osteophytes (fusaion from C2-C5 anteriorly due to flowing osteophytes form cervical spine CT)  with aspiration of thin, coughed but unproductive to eject aspirates. Rec NPO, no improvement with follow up sessions and received PEG with sips water  recommeneded. Pt today reports I never stopped eating and currently PEG is in place. Scheduled for outpatient MBS but was  no show and stated he didn't feel like coming . Type of Study: Bedside Swallow Evaluation Previous Swallow Assessment:  (see HPI) Diet Prior to this Study: Regular;Thin liquids (Level 0) Temperature Spikes Noted: No Respiratory Status: Room air History of Recent Intubation: No Behavior/Cognition: Alert;Cooperative;Pleasant mood Oral Cavity Assessment: Within Functional Limits Oral Care Completed by SLP: No Oral Cavity - Dentition: Dentures, top;Dentures, bottom Vision: Functional for self-feeding Self-Feeding Abilities: Able to feed self Patient Positioning: Upright in bed Baseline Vocal Quality: Normal Volitional Cough: Strong Volitional Swallow: Able to elicit     Oral/Motor/Sensory Function Overall Oral Motor/Sensory Function: Within functional limits   Ice Chips Ice chips: Not tested   Thin Liquid Thin Liquid: Impaired Presentation: Straw Pharyngeal  Phase Impairments: Cough - Immediate (had baseline cough)    Nectar Thick Nectar Thick Liquid: Not tested   Honey Thick Honey Thick Liquid: Not tested   Puree Puree: Not tested   Solid     Solid: Within functional limits      Dustin Olam Bull 08/09/2024,9:28 AM

## 2024-08-09 NOTE — Assessment & Plan Note (Addendum)
 B/l pleural effusions R>L. - Will diurese as above - Labs: AM serum LDH, INR (could not add-on to today's draw per Lab) - Getting diagnostic + therapeutic thoracentesis given malignancy history and in setting of abnormal hepatic function - Consulting oncology given pleural effusions with malignancy history to determine whether anything additionally needs to be done or if pulm needs to be consulted - Consider repeat CXR after a few days to reevaluate

## 2024-08-09 NOTE — Progress Notes (Signed)
 Daily Progress Note Intern Pager: [phone]  Patient name: Kyle Montoya Medical record number: [id number] Date of birth: [birth year] Age: 73 y.o. Gender: male  Primary Care Provider: Nestor Elston NOVAK, NP Consultants: None Code Status: DNR/DNI  Pt Overview and Major Events to Date:  - 11/16: Admitted  Medical Decision Making:  Kyle Montoya is a 73 y.o. male with PMH of CHF(?), COPD, EtOH abuse, hx dysphagia s/p PEG (01/2024), history of multiple cancers (lung cancer sp resection, CML, cholangiocarcinoma), HTN, ascending aortic aneurysm, IDA. Admitted for cough and dyspnea likely 2/2 COPD vs CHF. Have ruled out PE, PNA, ACS. Evaluating CHF with echo, pending. CT chest with lung nodule, no further treatment/investigation this admission but will follow outpatient.  Dry weight suspected to be around 190lb. Today 214 lb ( down 3 lb from 217 on 11/16). Suspect component of fluid overload.  Liver enzymes significantly elevated on hepatic function panel today (AST 438, ALT 383).  Alk phos elevated to 416, total bili 2.9 with direct bili of 1.5.  Hepatitis panel from 03/2023 was negative. Assessment & Plan Dyspnea Cough COPD (chronic obstructive pulmonary disease) (HCC) - Imaging: echocardiogram - Antibiotics: PO Azithromycin  500 mg daily for 3 days (11/16-11/18) - Breathing tx: Home Breztri (formulary equivalent of Trelegy Ellipta) 2 puffs twice daily; DuoNebs every 4 hours for 24 hours, followed by every 4 as needed - Prednisone  40 mg daily starting 11/17 to complete 5-day course (11/17-11/20) - S/p IV Solu-Medrol once in ED 11/16 - Diuresis: IV Lasix  40 mg once - S/p IV Lasix  60 mg once in the ED - Cough: Tessalon  pearls 100 mg TID prn, robitussin 5 mL q4h PRN - Pain: Norco 5-325 mg q6h PRN - Labs: AM CMP, CBC with diff, Mag - Repleting electrolytes as needed; repleted Mag with IV 1 g and K 40 mEq po this morning - Strict I's/O's, daily weights - Consider PT/OT eval once breathing  improved Pleural effusion B/l pleural effusions R>L. - Will diurese as above - Labs: AM serum LDH, INR (could not add-on to today's draw per Lab) - Getting diagnostic + therapeutic thoracentesis given malignancy history and in setting of abnormal hepatic function - Consulting oncology given pleural effusions with malignancy history to determine whether anything additionally needs to be done or if pulm needs to be consulted - Consider repeat CXR after a few days to reevaluate Bilateral lower extremity edema R>L lower extremity edema.  Given robust cancer history, ruling out clot. - Bilateral DVT US  pending Dysphagia Hx of dysphagia leading to PEG tube placement in 01/2024; tube still present. Patient po-ing fine. - SLP seen, recommending MBS; scheduled for 12pm today (okay to keep current Regular diet until this) - Consider RD consult following - Keep PEG tube in place for now pending repeat barium swallow Alcohol use Elevated liver enzymes Elevated bilirubin Regular alcohol use with last drink 11/15. Hepatic function panel today showing elevated liver enzymes, elevated bili and direct bili. CT AP with no significant liver findings noted; no biliary duct dilation and status postcholecystectomy. Clinically stable. - CIWAs ordered without Ativan ; 0s so far - Folic acid , thiamine , multivitamin - Will continue to monitor; diuresing as above Hyponatremia Na of 127, stable. - Continue po Concern about memory On chart review, concerns for memory impairment per family in prior notes. - Consider dementia workup with labs, CT head later this hospitalization (given poor outpatient follow-up) Chronic health problem Hypotension: Consider starting home midodrine  10 mg BID if BP drops with diuresis.  BPH: Restarting home Flomax  0.8 mg daily Anemia: Stable, continue to monitor. Cancer history: Lung cancer s/p resection, cholangiocarcinoma, CML; taking Dasatinib  (chemo), though no fills since 07/2023  per med rec.   FEN/GI: Regular PPx: Lovenox  Dispo: Home pending clinical improvement . Barriers include continued need for breathing treatments inpatient.   Subjective:  Patient reports his breathing is about the same as when he came in.  Still reports being very short of breath with lying down and slept with the head of the bed elevated overnight.  Also reports shortness of breath with moving.  Initially on O2 via mask, however removed during interview by RT and patient maintaining O2 sats on room air.  Objective: Temp:  [97.4 F (36.3 C)-98 F (36.7 C)] 98 F (36.7 C) (11/17 0407) Pulse Rate:  [76-113] 86 (11/17 0600) Resp:  [17-22] 19 (11/17 0407) BP: (131-161)/(78-96) 131/78 (11/17 0407) SpO2:  [97 %-100 %] 97 % (11/17 0407) Weight:  [90.7 kg-98.7 kg] 97.5 kg (11/17 0405) Physical Exam: General: Patient sitting up in bed eating breakfast, no acute distress. Cardiovascular: Regular rate and rhythm.  Breath sounds somewhat obscuring heart sounds. Respiratory: Audible wheezing.  Normal work of breathing on room air.  Diffuse wheezing inspiratory and expiratory throughout lung fields. Abdomen: Bowel sounds present and normoactive bilaterally. Soft, nontender.  Minimally tense.  PEG tube in place. Extremities: Skin warm, dry. No bilateral lower extremity edema.  Laboratory: Most recent CBC Lab Results  Component Value Date   WBC 4.3 08/09/2024   HGB 9.7 (L) 08/09/2024   HCT 28.2 (L) 08/09/2024   MCV 87.0 08/09/2024   PLT 106 (L) 08/09/2024   Most recent BMP    Latest Ref Rng & Units 08/09/2024    3:15 AM  BMP  Glucose 70 - 99 mg/dL 715   BUN 8 - 23 mg/dL 11   Creatinine 9.38 - 1.24 mg/dL 9.15   Sodium 864 - 854 mmol/L 127   Potassium 3.5 - 5.1 mmol/L 3.6   Chloride 98 - 111 mmol/L 96   CO2 22 - 32 mmol/L 20   Calcium 8.9 - 10.3 mg/dL 7.2   Mag 1.8     Latest Ref Rng & Units 08/09/2024    3:15 AM 02/06/2024    3:44 AM 02/05/2024    3:24 PM  Hepatic Function   Total Protein 6.5 - 8.1 g/dL 5.6  5.2  5.7   Albumin  3.5 - 5.0 g/dL 2.2  2.4  2.4   AST 15 - 41 U/L 438  54  61   ALT 0 - 44 U/L 383  23  26   Alk Phosphatase 38 - 126 U/L 416  61  69   Total Bilirubin 0.0 - 1.2 mg/dL 2.9  0.7  0.7   Bilirubin, Direct 0.0 - 0.2 mg/dL 1.5       Larraine Palma, MD 08/09/2024, 7:21 AM  PGY-1, [facility] FPTS Intern pager: [phone], text pages welcome Secure chat group [facility] Teaching Service

## 2024-08-10 ENCOUNTER — Inpatient Hospital Stay (HOSPITAL_COMMUNITY)

## 2024-08-10 ENCOUNTER — Telehealth: Payer: Self-pay | Admitting: Critical Care Medicine

## 2024-08-10 DIAGNOSIS — E440 Moderate protein-calorie malnutrition: Secondary | ICD-10-CM

## 2024-08-10 DIAGNOSIS — I5023 Acute on chronic systolic (congestive) heart failure: Secondary | ICD-10-CM

## 2024-08-10 DIAGNOSIS — R748 Abnormal levels of other serum enzymes: Secondary | ICD-10-CM | POA: Diagnosis not present

## 2024-08-10 DIAGNOSIS — I517 Cardiomegaly: Secondary | ICD-10-CM

## 2024-08-10 DIAGNOSIS — R911 Solitary pulmonary nodule: Secondary | ICD-10-CM | POA: Diagnosis present

## 2024-08-10 DIAGNOSIS — I351 Nonrheumatic aortic (valve) insufficiency: Secondary | ICD-10-CM | POA: Diagnosis present

## 2024-08-10 DIAGNOSIS — R17 Unspecified jaundice: Secondary | ICD-10-CM

## 2024-08-10 DIAGNOSIS — J441 Chronic obstructive pulmonary disease with (acute) exacerbation: Secondary | ICD-10-CM | POA: Diagnosis not present

## 2024-08-10 DIAGNOSIS — J4541 Moderate persistent asthma with (acute) exacerbation: Principal | ICD-10-CM | POA: Diagnosis present

## 2024-08-10 LAB — COMPREHENSIVE METABOLIC PANEL WITH GFR
ALT: 317 U/L — ABNORMAL HIGH (ref 0–44)
AST: 297 U/L — ABNORMAL HIGH (ref 15–41)
Albumin: 2.2 g/dL — ABNORMAL LOW (ref 3.5–5.0)
Alkaline Phosphatase: 328 U/L — ABNORMAL HIGH (ref 38–126)
Anion gap: 10 (ref 5–15)
BUN: 16 mg/dL (ref 8–23)
CO2: 20 mmol/L — ABNORMAL LOW (ref 22–32)
Calcium: 7.2 mg/dL — ABNORMAL LOW (ref 8.9–10.3)
Chloride: 99 mmol/L (ref 98–111)
Creatinine, Ser: 1.03 mg/dL (ref 0.61–1.24)
GFR, Estimated: >60 mL/min (ref >60)
Glucose, Bld: 177 mg/dL — ABNORMAL HIGH (ref 70–99)
Potassium: 3.8 mmol/L (ref 3.5–5.1)
Sodium: 129 mmol/L — ABNORMAL LOW (ref 135–145)
Total Bilirubin: 1.8 mg/dL — ABNORMAL HIGH (ref 0.0–1.2)
Total Protein: 5.3 g/dL — ABNORMAL LOW (ref 6.5–8.1)

## 2024-08-10 LAB — CBC WITH DIFFERENTIAL/PLATELET
Abs Immature Granulocytes: 0.02 K/uL (ref 0.00–0.07)
Basophils Absolute: 0 K/uL (ref 0.0–0.1)
Basophils Relative: 0 %
Eosinophils Absolute: 0 K/uL (ref 0.0–0.5)
Eosinophils Relative: 0 %
HCT: 23.1 % — ABNORMAL LOW (ref 39.0–52.0)
Hemoglobin: 7.9 g/dL — ABNORMAL LOW (ref 13.0–17.0)
Immature Granulocytes: 0 %
Lymphocytes Relative: 6 %
Lymphs Abs: 0.4 K/uL — ABNORMAL LOW (ref 0.7–4.0)
MCH: 30.3 pg (ref 26.0–34.0)
MCHC: 34.2 g/dL (ref 30.0–36.0)
MCV: 88.5 fL (ref 80.0–100.0)
Monocytes Absolute: 1.1 K/uL — ABNORMAL HIGH (ref 0.1–1.0)
Monocytes Relative: 15 %
Neutro Abs: 5.4 K/uL (ref 1.7–7.7)
Neutrophils Relative %: 79 %
Platelets: 130 K/uL — ABNORMAL LOW (ref 150–400)
RBC: 2.61 MIL/uL — ABNORMAL LOW (ref 4.22–5.81)
RDW: 18.7 % — ABNORMAL HIGH (ref 11.5–15.5)
WBC: 6.9 K/uL (ref 4.0–10.5)
nRBC: 0 % (ref 0.0–0.2)

## 2024-08-10 LAB — MAGNESIUM: Magnesium: 1.9 mg/dL (ref 1.7–2.4)

## 2024-08-10 LAB — LACTATE DEHYDROGENASE: LDH: 227 U/L (ref 105–235)

## 2024-08-10 LAB — VITAMIN B12: Vitamin B-12: 1614 pg/mL — ABNORMAL HIGH (ref 180–914)

## 2024-08-10 LAB — PROTIME-INR
INR: 1.2 (ref 0.8–1.2)
Prothrombin Time: 15.8 s — ABNORMAL HIGH (ref 11.4–15.2)

## 2024-08-10 LAB — HEMOGLOBIN AND HEMATOCRIT, BLOOD
HCT: 23.1 % — ABNORMAL LOW (ref 39.0–52.0)
Hemoglobin: 7.9 g/dL — ABNORMAL LOW (ref 13.0–17.0)

## 2024-08-10 LAB — TSH: TSH: 1.364 u[IU]/mL (ref 0.350–4.500)

## 2024-08-10 MED ORDER — GLUCERNA SHAKE PO LIQD
237.0000 mL | Freq: Three times a day (TID) | ORAL | Status: DC
  Administered 2024-08-10 – 2024-08-13 (×6): 237 mL via ORAL

## 2024-08-10 MED ORDER — MAGNESIUM SULFATE IN D5W 1-5 GM/100ML-% IV SOLN
1.0000 g | Freq: Once | INTRAVENOUS | Status: AC
  Administered 2024-08-10: 1 g via INTRAVENOUS
  Filled 2024-08-10 (×2): qty 100

## 2024-08-10 MED ORDER — FUROSEMIDE 10 MG/ML IJ SOLN
80.0000 mg | Freq: Once | INTRAMUSCULAR | Status: AC
  Administered 2024-08-10: 80 mg via INTRAVENOUS
  Filled 2024-08-10: qty 8

## 2024-08-10 MED ORDER — POTASSIUM CHLORIDE 20 MEQ PO PACK
40.0000 meq | PACK | Freq: Once | ORAL | Status: AC
  Administered 2024-08-10: 40 meq via ORAL
  Filled 2024-08-10: qty 2

## 2024-08-10 MED ORDER — METHOCARBAMOL 500 MG PO TABS
750.0000 mg | ORAL_TABLET | Freq: Four times a day (QID) | ORAL | Status: DC | PRN
  Administered 2024-08-10: 750 mg via ORAL
  Filled 2024-08-10: qty 2

## 2024-08-10 MED ORDER — IPRATROPIUM-ALBUTEROL 0.5-2.5 (3) MG/3ML IN SOLN
3.0000 mL | Freq: Four times a day (QID) | RESPIRATORY_TRACT | Status: DC
  Administered 2024-08-10 – 2024-08-11 (×4): 3 mL via RESPIRATORY_TRACT
  Filled 2024-08-10 (×4): qty 3

## 2024-08-10 NOTE — Assessment & Plan Note (Addendum)
 Dry weight ~190 lb. Weight today of 216 lb. Out 500 mL yesterday. - Antibiotics: PO Azithromycin  500 mg daily for 3 days (11/16-11/18) - Breathing tx: Home Breztri (formulary equivalent of Trelegy Ellipta) 2 puffs twice daily; DuoNebs q4h prn -> schedule DuoNebs q6h - Prednisone  40 mg daily starting 11/17 to complete 5-day course (11/17-11/20) - S/p IV Solu-Medrol once in ED 11/16 - Diuresis: Ordered IV Lasix  80 mg once today - S/p IV Lasix  60 mg once in the ED, s/p IV Lasix  40 mg once 11/17 - Cough: Tessalon  pearls 100 mg TID prn, robitussin 5 mL q4h PRN - Pain: Norco 5-325 mg q6h PRN, robaxin  -> discontinue robaxin  - Labs: AM CMP, CBC, Mag - Repleting electrolytes as needed; Mag repleted with 1g IV once, K repleted with 40 mEq po once. - Strict I's/O's, daily weights - PT/OT eval

## 2024-08-10 NOTE — Assessment & Plan Note (Addendum)
 Exudative effusion based on thoracentesis findings. Concerning for CML per pulm. - Pulmonology following, appreciate recs - Flow cytology pending - Expectorated sputum pending - Fluid culture: No growth < 24 hr - Diurese as above

## 2024-08-10 NOTE — Assessment & Plan Note (Addendum)
 Regular alcohol use with last drink 11/15. Liver enzymes and bili/direct bili elevated but improving. CTAP without significant liver findings.  - RUQ US  read pending. - CIWAs ordered without Ativan ; 0s so far - Folic acid , thiamine , multivitamin

## 2024-08-10 NOTE — Assessment & Plan Note (Signed)
 Na of 129, stable. - Continue po

## 2024-08-10 NOTE — Assessment & Plan Note (Addendum)
 Hypotension: Consider starting home midodrine  10 mg BID if BP drops with diuresis. BPH: Home Flomax  0.8 mg daily Cancer history: Lung cancer s/p resection, cholangiocarcinoma, CML; taking Dasatinib  (chemo), though no fills since 07/2023 per med rec.

## 2024-08-10 NOTE — Telephone Encounter (Signed)
 OP Pulmonology follow up requested for LLL nodule.  Kyle SHAUNNA Gaskins, DO 08/10/24 7:41 AM [facility]  For contact information, see Amion. If no response to pager, please call PCCM consult pager. After hours, 7PM- 7AM, please call Elink.

## 2024-08-10 NOTE — Assessment & Plan Note (Signed)
 Hx of dysphagia leading to PEG tube placement in 01/2024; tube still present. MBS 11/17 with moderate pharyngoesophageal dysphagia. Patient educated about risks of po, understood risks and wanted to continue to po. - Continue regular diet - Speech following, appreciate assistance in patient's care - RD consulted - Keep PEG tube in place for possible future need

## 2024-08-10 NOTE — Assessment & Plan Note (Signed)
 Hgb decreased to 7.9 today, stable at repeat later this morning. Does have hx of CAD, however patient stable clinically and borderline low. Will continue to monitor. - Recheck tomorrow am; transfuse if Hgb < 7.5

## 2024-08-10 NOTE — Assessment & Plan Note (Addendum)
 R>L lower extremity edema.  Given robust cancer history, ruling out clot. Preliminary BLE US  without evidence of DVT bilaterally. - Full DVT US  reading pending

## 2024-08-10 NOTE — Telephone Encounter (Signed)
 Appointment scheduled with Dr.Baral.

## 2024-08-10 NOTE — Assessment & Plan Note (Addendum)
 On chart review, concerns for memory impairment per family in prior notes. Dementia labs so far normal; high B12, normal TSH. Pt was getting B12 injection until 04/2024. - Will investigate reasons for elevated B12 - Will get CT head w/out contrast as part of dementia workup

## 2024-08-10 NOTE — Progress Notes (Addendum)
 Speech Language Pathology Treatment: Dysphagia  Patient Details Name: Kyle Montoya MRN: [id number] DOB: 05/24/1951 Today's Date: 08/10/2024 Time: 8892-8880 SLP Time Calculation (min) (ACUTE ONLY): 12 min  Assessment / Plan / Recommendation Clinical Impression  Pt seen after MBS yesterday and reviewed results and recommendations and dentures donned. Pt has decreased memory and could not recall any information or strategies from education yesterday. He needed reminder to use written strategies on closet door multiple times throughout session. Consumed regular, thin liquids without apparent s/s aspiration. He was able to follow strategies with cues needed each time for double swallow, hard cough after liquids and able to perform. Reiterated need to eat in upright position as son states he had been eating in bed and slumped down- pt stated my son told me this too. Continue regular texture, thin liquids, pills as tolerated and needs full assist to follow strategies due to decreased cognitive ability. Will follow up once more for continued education and educate/provide swallow exercises (for home practice) to facilitate tongue base and pharyngeal contraction to compensate during times of deconditioning.    HPI HPI: Kyle Montoya is a 73 y.o. male with PMH of CHF(?), COPD, EtOH abuse, hx dysphagia s/p PEG (01/2024), 2022 EGD distal esophagus was moderately tortuous, history of multiple cancers (lung cancer sp resection, CML, cholangiocarcinoma), HTN, ascending aortic aneurysm, IDA presenting with cough and trouble breathing. Diagnosed with COPD exacerbation, infection/aspiration pna. MBS 5/19 with structural abnormalities, osteophytes (fusaion from C2-C5 anteriorly due to flowing osteophytes form cervical spine CT)  with aspiration of thin, coughed but unproductive to eject aspirates. Rec NPO, no improvement with follow up sessions and received PEG with sips water  recommeneded. Pt today reports I never  stopped eating and currently PEG is in place. Scheduled for outpatient MBS but was no show and stated he didn't feel like coming.      SLP Plan  Continue with current plan of care          Recommendations  Diet recommendations: Regular;Thin liquid Liquids provided via: Straw;Cup Medication Administration:  (as tolerated) Supervision: Patient able to self feed;Full supervision/cueing for compensatory strategies Compensations: Slow rate;Small sips/bites;Multiple dry swallows after each bite/sip;Follow solids with liquid;Hard cough after swallow Postural Changes and/or Swallow Maneuvers: Seated upright 90 degrees                  Oral care BID   Frequent or constant Supervision/Assistance Dysphagia, pharyngoesophageal phase (R13.14)     Continue with current plan of care     Kyle Montoya  08/10/2024, 11:25 AM

## 2024-08-10 NOTE — Progress Notes (Signed)
 Daily Progress Note Intern Pager: [phone]  Patient name: Kyle Montoya Medical record number: [id number] Date of birth: [birth year] Age: 73 y.o. Gender: male  Primary Care Provider: Nestor Elston NOVAK, NP Consultants: Oncology (messaged only), Pulmonology Code Status: DNR/DNI  Pt Overview and Major Events to Date:  - 11/16: Admitted - 11/17: Thoracentesis, pulm consulted  Medical Decision Making:  Kyle Montoya is a 73 y.o. male with PMH of CHF(?), COPD, EtOH abuse, hx dysphagia s/p PEG (01/2024), history of multiple cancers (lung cancer sp resection, CML, cholangiocarcinoma), HTN, ascending aortic aneurysm, CAD, IDA. Admitted for cough and dyspnea likely 2/2 COPD vs CHF exacerbation. Echo 11/17 with right ventricular systolic function mildly reduced, severely enlarged RV and RA pressure of 15 mmHg, suggesting pulmonary disease causing right heart failure.  Thoracentesis done 11/17 with 800 mL aspirated from right effusion. Light's Criteria met with Pleural fluid LDH / Serum LDH >0.6 (was 0.89); also Pleural fluid protein / Serum protein >0.5 (was ~0.88), suggestive of exudative effusion. This is concerning for malignancy, especially with lung nodule found on CT. Pulm already following, concern for CML and flow cytology pending. Planning for biopsy outpatient. Assessment & Plan Dyspnea Cough COPD exacerbation (HCC) Acute decompensated heart failure (HCC) Dry weight ~190 lb. Weight today of 216 lb. Out 500 mL yesterday. - Antibiotics: PO Azithromycin  500 mg daily for 3 days (11/16-11/18) - Breathing tx: Home Breztri (formulary equivalent of Trelegy Ellipta) 2 puffs twice daily; DuoNebs q4h prn -> schedule DuoNebs q6h - Prednisone  40 mg daily starting 11/17 to complete 5-day course (11/17-11/20) - S/p IV Solu-Medrol once in ED 11/16 - Diuresis: Ordered IV Lasix  80 mg once today - S/p IV Lasix  60 mg once in the ED, s/p IV Lasix  40 mg once 11/17 - Cough: Tessalon  pearls 100 mg TID prn,  robitussin 5 mL q4h PRN - Pain: Norco 5-325 mg q6h PRN, robaxin  -> discontinue robaxin  - Labs: AM CMP, CBC, Mag - Repleting electrolytes as needed; Mag repleted with 1g IV once, K repleted with 40 mEq po once. - Strict I's/O's, daily weights - PT/OT eval Pleural effusion Pulmonary nodule Exudative effusion based on thoracentesis findings. Concerning for CML per pulm. - Pulmonology following, appreciate recs - Flow cytology pending - Expectorated sputum pending - Fluid culture: No growth < 24 hr - Diurese as above Bilateral lower extremity edema R>L lower extremity edema.  Given robust cancer history, ruling out clot. Preliminary BLE US  without evidence of DVT bilaterally. - Full DVT US  reading pending Dysphagia Presence of externally removable percutaneous endoscopic gastrostomy (PEG) tube (HCC) Hx of dysphagia leading to PEG tube placement in 01/2024; tube still present. MBS 11/17 with moderate pharyngoesophageal dysphagia. Patient educated about risks of po, understood risks and wanted to continue to po. - Continue regular diet - Speech following, appreciate assistance in patient's care - RD consulted - Keep PEG tube in place for possible future need Alcohol use Elevated liver enzymes Elevated bilirubin Regular alcohol use with last drink 11/15. Liver enzymes and bili/direct bili elevated but improving. CTAP without significant liver findings.  - RUQ US  read pending. - CIWAs ordered without Ativan ; 0s so far - Folic acid , thiamine , multivitamin Concern about memory On chart review, concerns for memory impairment per family in prior notes. Dementia labs so far normal; high B12, normal TSH. Pt was getting B12 injection until 04/2024. - Will investigate reasons for elevated B12 - Will get CT head w/out contrast as part of dementia workup Anemia in chronic illness  Hgb decreased to 7.9 today, stable at repeat later this morning. Does have hx of CAD, however patient stable clinically  and borderline low. Will continue to monitor. - Recheck tomorrow am; transfuse if Hgb < 7.5 Hyponatremia Na of 129, stable. - Continue po Chronic health problem Hypotension: Consider starting home midodrine  10 mg BID if BP drops with diuresis. BPH: Home Flomax  0.8 mg daily Cancer history: Lung cancer s/p resection, cholangiocarcinoma, CML; taking Dasatinib  (chemo), though no fills since 07/2023 per med rec.   FEN/GI: Regular PPx: Lovenox  Dispo:Pending PT recommendations  pending clinical improvement . Barriers include need for continued breathing treatments, IV diuresis.   Subjective:  Patient reports breathing might be slightly improved post thoracentesis, but still overall with dyspnea.  Continues to cough.  Continues to report orthopnea.  No concerns with urination or stooling, no other concerns reported.  Objective: Temp:  [97.5 F (36.4 C)-97.9 F (36.6 C)] 97.8 F (36.6 C) (11/18 0729) Pulse Rate:  [74-98] 93 (11/18 0729) Resp:  [16-21] 20 (11/18 0729) BP: (103-128)/(60-84) 103/60 (11/18 0729) SpO2:  [96 %-97 %] 97 % (11/18 0729) Weight:  [98.4 kg] 98.4 kg (11/18 0346) Physical Exam: General: Patient sitting up in bed with head of bed elevated eating breakfast, no acute distress. Cardiovascular: Regular rate and rhythm, heart sounds obscured by lung sounds. Respiratory: Intermittent cough.  Normal work of breathing on room air.  Lungs with diffuse wheezing, expiratory > inspiratory. Abdomen: Bowel sounds present and normoactive bilaterally. Soft, nondistended.  Minimal tenderness around umbilicus, no rebound, no guarding.  PEG tube in place. Extremities: Skin warm, dry. No left lower extremity edema.  Minimal to 1+ right lower extremity pitting edema.  Laboratory: Most recent CBC Lab Results  Component Value Date   WBC 6.9 08/10/2024   HGB 7.9 (L) 08/10/2024   HCT 23.1 (L) 08/10/2024   MCV 88.5 08/10/2024   PLT 130 (L) 08/10/2024   Most recent BMP    Latest Ref  Rng & Units 08/10/2024    3:37 AM  BMP  Glucose 70 - 99 mg/dL 822   BUN 8 - 23 mg/dL 16   Creatinine 9.38 - 1.24 mg/dL 8.96   Sodium 864 - 854 mmol/L 129   Potassium 3.5 - 5.1 mmol/L 3.8   Chloride 98 - 111 mmol/L 99   CO2 22 - 32 mmol/L 20   Calcium 8.9 - 10.3 mg/dL 7.2   Mag: 1.9  Lymphocyte flow cytometry pending Vitamin B12: 1614 TSH: 1.364 PT-INR: 15.8 LDH: 227   Larraine Palma, MD 08/10/2024, 8:11 AM  PGY-1, [facility] FPTS Intern pager: [phone], text pages welcome Secure chat group [facility] Teaching Service

## 2024-08-10 NOTE — Progress Notes (Addendum)
 Initial Nutrition Assessment  DOCUMENTATION CODES:   Non-severe (moderate) malnutrition in context of social or environmental circumstances  INTERVENTION:  Glucerna Shake PO TID. Each supplement provides 220 Kcals and 10 grams of protein. Continue 100 mg thiamine  daily PO. Continue daily multivitamin PO. Continue 1 mg folic acid  daily PO. Continue regular diet. If PO intake remains poor, consider supplemental EN via existing PEG if patient agrees.   NUTRITION DIAGNOSIS:   Moderate Malnutrition related to social / environmental circumstances (ETOH abuse) as evidenced by mild fat depletion, moderate muscle depletion.   GOAL:   Patient will meet greater than or equal to 90% of their needs   MONITOR:   PO intake, Supplement acceptance, Labs, Weight trends  REASON FOR ASSESSMENT:   Consult Assessment of nutrition requirement/status  ASSESSMENT:   Patient presented with SOB, cough and LE swelling and was found to have bilateral pleural effusions suspicious for malignancy and post-op adhesions from prior lobectomy. PMH significant for mod malnutrition 2/2 ETOH, 01/2024 admission for PNA and dysphagia requiring Cortrak and eventual PEG, DM2, COPD, HFrEF, cholangiocarcinoma s/p [place] 2014, lung cancer s/p lobectomy 2016, chronic myeloid leukemia (CML), ETOH abuse, HTN, CAD, dyslipidemia, thoracic aortic aneurysm 2023.  Significant timeline: 11/16 admitted 11/17 thoracentesis with 800 mL output; MBS showing dysphagia but patient desires to accept aspiration risk and continue PO  The patient's son has reported that the patient has had declined swallowing function over the past several weeks as his ETOH consumption increased and often eats laying down. Visited the patient who tells me his appetite decreased about a week ago and he is unsure of etiology. He has been drinking Ensure at home BID. His UBW is 200 lbs and it has been stable. He tells me he has not used his PEG since last  admission in May. Suggested supplemental EN via PEG to reduce aspiration risk and the patient tells me he has no problems eating or drinking and has been drinking his ONS at home and eating well and does not need to use his feeding tube.  Typical day's intake: Breakfast - 2 eggs with grits Lunch - skips Dinner - meat with veggies  Scheduled Meds:  arformoterol   15 mcg Nebulization BID   budesonide (PULMICORT) nebulizer solution  0.5 mg Nebulization BID   enoxaparin  (LOVENOX ) injection  40 mg Subcutaneous Q24H   folic acid   1 mg Oral Daily   multivitamin with minerals  1 tablet Oral Daily   predniSONE   40 mg Oral Q breakfast   revefenacin   175 mcg Nebulization Daily   tamsulosin   0.8 mg Oral Daily   thiamine   100 mg Oral Daily   Or   thiamine   100 mg Intravenous Daily   Continuous Infusions:  magnesium  sulfate bolus IVPB       Diet Order             Diet regular Room service appropriate? Yes; Fluid consistency: Thin  Diet effective now                  Meal Intake: 25%  Labs:     Latest Ref Rng & Units 08/10/2024    3:37 AM 08/09/2024    3:15 AM 08/08/2024    9:30 AM  CMP  Glucose 70 - 99 mg/dL 822  715  880   BUN 8 - 23 mg/dL 16  11  9    Creatinine 0.61 - 1.24 mg/dL 8.96  9.15  9.28   Sodium 135 - 145 mmol/L 129  127  128   Potassium 3.5 - 5.1 mmol/L 3.8  3.6  3.5   Chloride 98 - 111 mmol/L 99  96  97   CO2 22 - 32 mmol/L 20  20  19    Calcium 8.9 - 10.3 mg/dL 7.2  7.2  7.5   Total Protein 6.5 - 8.1 g/dL 5.3  5.6    Total Bilirubin 0.0 - 1.2 mg/dL 1.8  2.9    Alkaline Phos 38 - 126 U/L 328  416    AST 15 - 41 U/L 297  438    ALT 0 - 44 U/L 317  383        I/O: -123 mL since admit  NUTRITION - FOCUSED PHYSICAL EXAM:  Flowsheet Row Most Recent Value  Orbital Region Mild depletion  Upper Arm Region No depletion  Thoracic and Lumbar Region No depletion  Buccal Region Moderate depletion  Temple Region Moderate depletion  Clavicle Bone Region No  depletion  Clavicle and Acromion Bone Region No depletion  Scapular Bone Region No depletion  Dorsal Hand Moderate depletion  Patellar Region No depletion  Anterior Thigh Region Mild depletion  Posterior Calf Region Moderate depletion  Edema (RD Assessment) Severe  [+3 BLE]  Hair Reviewed  Eyes Reviewed  Mouth Reviewed  [dentures]  Skin Reviewed  Nails Reviewed    EDUCATION NEEDS:   Education needs have been addressed  Skin:  Skin Assessment: Reviewed RN Assessment  Last BM:  11/15  Height:   Ht Readings from Last 1 Encounters:  08/08/24 5' 11 (1.803 m)    Weight:    Weight Change: weight gain since last admission 01/2024 but recent weight gain likely due to edema Usual Body Weight: 200 lbs  Edema: +3 BLE  Ideal Body Weight:  78 kg   BMI:  Body mass index is 30.26 kg/m.  Estimated Nutritional Needs:  Kcal:  2000-2300 Protein:  90-110 Fluid:  >1800    Leverne Ruth, MS, RDN, LDN [place]. [facility] See AMION for contact information

## 2024-08-11 ENCOUNTER — Inpatient Hospital Stay (HOSPITAL_COMMUNITY)

## 2024-08-11 ENCOUNTER — Encounter (HOSPITAL_COMMUNITY): Payer: Self-pay

## 2024-08-11 DIAGNOSIS — I5033 Acute on chronic diastolic (congestive) heart failure: Secondary | ICD-10-CM

## 2024-08-11 DIAGNOSIS — Z789 Other specified health status: Secondary | ICD-10-CM | POA: Diagnosis not present

## 2024-08-11 DIAGNOSIS — R918 Other nonspecific abnormal finding of lung field: Secondary | ICD-10-CM

## 2024-08-11 DIAGNOSIS — R1310 Dysphagia, unspecified: Secondary | ICD-10-CM

## 2024-08-11 DIAGNOSIS — K760 Fatty (change of) liver, not elsewhere classified: Secondary | ICD-10-CM | POA: Diagnosis present

## 2024-08-11 DIAGNOSIS — J441 Chronic obstructive pulmonary disease with (acute) exacerbation: Secondary | ICD-10-CM | POA: Diagnosis not present

## 2024-08-11 DIAGNOSIS — C3490 Malignant neoplasm of unspecified part of unspecified bronchus or lung: Secondary | ICD-10-CM

## 2024-08-11 DIAGNOSIS — Z87891 Personal history of nicotine dependence: Secondary | ICD-10-CM

## 2024-08-11 DIAGNOSIS — C9210 Chronic myeloid leukemia, BCR/ABL-positive, not having achieved remission: Secondary | ICD-10-CM

## 2024-08-11 LAB — HEMOGLOBIN AND HEMATOCRIT, BLOOD
HCT: 30.5 % — ABNORMAL LOW (ref 39.0–52.0)
Hemoglobin: 10.2 g/dL — ABNORMAL LOW (ref 13.0–17.0)

## 2024-08-11 LAB — CBC
HCT: 23.3 % — ABNORMAL LOW (ref 39.0–52.0)
Hemoglobin: 7.7 g/dL — ABNORMAL LOW (ref 13.0–17.0)
MCH: 29.7 pg (ref 26.0–34.0)
MCHC: 33 g/dL (ref 30.0–36.0)
MCV: 90 fL (ref 80.0–100.0)
Platelets: 130 K/uL — ABNORMAL LOW (ref 150–400)
RBC: 2.59 MIL/uL — ABNORMAL LOW (ref 4.22–5.81)
RDW: 19.4 % — ABNORMAL HIGH (ref 11.5–15.5)
WBC: 5.1 K/uL (ref 4.0–10.5)
nRBC: 0 % (ref 0.0–0.2)

## 2024-08-11 LAB — COMPREHENSIVE METABOLIC PANEL WITH GFR
ALT: 287 U/L — ABNORMAL HIGH (ref 0–44)
AST: 230 U/L — ABNORMAL HIGH (ref 15–41)
Albumin: 2.4 g/dL — ABNORMAL LOW (ref 3.5–5.0)
Alkaline Phosphatase: 308 U/L — ABNORMAL HIGH (ref 38–126)
Anion gap: 11 (ref 5–15)
BUN: 12 mg/dL (ref 8–23)
CO2: 23 mmol/L (ref 22–32)
Calcium: 7.7 mg/dL — ABNORMAL LOW (ref 8.9–10.3)
Chloride: 96 mmol/L — ABNORMAL LOW (ref 98–111)
Creatinine, Ser: 0.78 mg/dL (ref 0.61–1.24)
GFR, Estimated: >60 mL/min (ref >60)
Glucose, Bld: 141 mg/dL — ABNORMAL HIGH (ref 70–99)
Potassium: 4.1 mmol/L (ref 3.5–5.1)
Sodium: 130 mmol/L — ABNORMAL LOW (ref 135–145)
Total Bilirubin: 1.6 mg/dL — ABNORMAL HIGH (ref 0.0–1.2)
Total Protein: 5.5 g/dL — ABNORMAL LOW (ref 6.5–8.1)

## 2024-08-11 LAB — MAGNESIUM: Magnesium: 2.1 mg/dL (ref 1.7–2.4)

## 2024-08-11 LAB — PREPARE RBC (CROSSMATCH)

## 2024-08-11 MED ORDER — IPRATROPIUM-ALBUTEROL 0.5-2.5 (3) MG/3ML IN SOLN
3.0000 mL | Freq: Four times a day (QID) | RESPIRATORY_TRACT | Status: DC | PRN
  Administered 2024-08-13: 3 mL via RESPIRATORY_TRACT
  Filled 2024-08-11: qty 3

## 2024-08-11 MED ORDER — ACETAMINOPHEN 325 MG PO TABS
650.0000 mg | ORAL_TABLET | Freq: Three times a day (TID) | ORAL | Status: DC | PRN
Start: 2024-08-11 — End: 2024-08-13
  Administered 2024-08-12 – 2024-08-13 (×3): 650 mg via ORAL
  Filled 2024-08-11 (×3): qty 2

## 2024-08-11 MED ORDER — SENNOSIDES-DOCUSATE SODIUM 8.6-50 MG PO TABS
1.0000 | ORAL_TABLET | Freq: Every day | ORAL | Status: DC
  Administered 2024-08-11 – 2024-08-12 (×2): 1 via ORAL
  Filled 2024-08-11 (×3): qty 1

## 2024-08-11 MED ORDER — SODIUM CHLORIDE 0.9% IV SOLUTION: Freq: Once | INTRAVENOUS | Status: AC

## 2024-08-11 MED ORDER — GUAIFENESIN-DM 100-10 MG/5ML PO SYRP
10.0000 mL | ORAL_SOLUTION | Freq: Three times a day (TID) | ORAL | Status: DC
  Administered 2024-08-11 – 2024-08-13 (×6): 10 mL via ORAL
  Filled 2024-08-11 (×6): qty 10

## 2024-08-11 MED ORDER — FUROSEMIDE 10 MG/ML IJ SOLN
80.0000 mg | Freq: Two times a day (BID) | INTRAMUSCULAR | Status: DC
Start: 2024-08-11 — End: 2024-08-13
  Administered 2024-08-11 – 2024-08-13 (×5): 80 mg via INTRAVENOUS
  Filled 2024-08-11 (×5): qty 8

## 2024-08-11 MED ORDER — FUROSEMIDE 10 MG/ML IJ SOLN: 80.0000 mg | Freq: Two times a day (BID) | INTRAMUSCULAR | Status: DC

## 2024-08-11 MED ORDER — POLYETHYLENE GLYCOL 3350 17 G PO PACK
17.0000 g | PACK | Freq: Every day | ORAL | Status: DC
  Administered 2024-08-11 – 2024-08-13 (×3): 17 g via ORAL
  Filled 2024-08-11 (×3): qty 1

## 2024-08-11 NOTE — Assessment & Plan Note (Addendum)
 Hgb of 7.7 today from 7.9 yesterday. Hx of CAD. Ferritin normal (284) in 01/2024. Suspect anemia related to liver disease. - Will transfuse 1u pRBCs - Recheck H&H post-transfusion - Will check retic count tomorrow am - Transfusion threshold Hgb < 8

## 2024-08-11 NOTE — Assessment & Plan Note (Addendum)
 On chart review, concerns for memory impairment per family in prior notes. Dementia labs so far normal; high B12, normal TSH. Pt was getting B12 injection until 04/2024. CT head without acute abnormalities. Suspect dementia based on CT findings of microvascular disease bridging from lateral horns along lateral ventricle. - No further workup indicated inpatient

## 2024-08-11 NOTE — Assessment & Plan Note (Signed)
 No DVT per bilateral lower extremity US . Patient reports some baseline edema R>L. No further workup indicated. - Diurese as above

## 2024-08-11 NOTE — Evaluation (Signed)
 Physical Therapy Evaluation Patient Details Name: Kyle Montoya MRN: [id number] DOB: 11-Feb-1951 Today's Date: 08/11/2024  History of Present Illness  Pt is a 73 y.o. M presenting to [facility] on 08/08/24 with c/o SOB x3 days, BLE edema, and requesting removal of feeding tube. Thoracentesis done on 11/17 yielding 800 mL, and preliminary BLE US  w/out evidence of DVT bilaterally. PMH is significant for CHF, AKI, alcohol abuse, CAD, DJD, HTN, PE, PNA, renal insufficiency, thoracic aortic aneurysm and T2DM.  Clinical Impression  Prior to admittance pt was mobilizing household distances without an AD although reports owning both a rollator and RW. Pt tolerated session well, ambulating without an AD and no physical assistance given. Pt does require several rest breaks in between bouts of ambulation secondary to SOB; with pursed lip breathing SOB subsides. Pt does require occasional verbal cueing to optimize transfers, but does not require physical assistance. Pt was encouraged to use rollator upon discharge for energy conservation and for improved balance. PT will continue to treat pt while he is admitted. Recommending HHPT at discharge to address remaining mobility deficits and optimize return to PLOF.       If plan is discharge home, recommend the following: A little help with walking and/or transfers;A little help with bathing/dressing/bathroom   Can travel by private vehicle        Equipment Recommendations None recommended by PT  Recommendations for Other Services       Functional Status Assessment Patient has had a recent decline in their functional status and demonstrates the ability to make significant improvements in function in a reasonable and predictable amount of time.     Precautions / Restrictions Precautions Precautions: Fall Restrictions Weight Bearing Restrictions Per Provider Order: No      Mobility  Bed Mobility                    Transfers Overall transfer  level: Needs assistance Equipment used: None Transfers: Sit to/from Stand Sit to Stand: Contact guard assist           General transfer comment: Pt completed multiple sit to stands from recliner and bathroom w/ CGA. Pt required several attempts w/ cues for using BUE to pushup from recliner and to increase truncal lean.    Ambulation/Gait Ambulation/Gait assistance: Contact guard assist Gait Distance (Feet): 20 Feet (x3 attempts w/ sitting rest break in between each) Assistive device: None Gait Pattern/deviations: Step-through pattern, Decreased stride length, Trunk flexed Gait velocity: reduced Gait velocity interpretation: <1.31 ft/sec, indicative of household ambulator   General Gait Details: Pt ambulates w/ significant forward trunk lean, reduced cadence, and decreased stride length. Pt was encouraged to use pursed lip breathing throughout.  Stairs            Wheelchair Mobility     Tilt Bed    Modified Rankin (Stroke Patients Only)       Balance Overall balance assessment: Needs assistance Sitting-balance support: Feet supported, No upper extremity supported Sitting balance-Leahy Scale: Good Sitting balance - Comments: pt able to sit edge of recliner w/out any losses of balance   Standing balance support: During functional activity, No upper extremity supported Standing balance-Leahy Scale: Fair Standing balance comment: pt did not demonstrate any losses of balance, but had significant forward trunk lean                             Pertinent Vitals/Pain Pain Assessment Pain Assessment: No/denies pain Pain  Score: 0-No pain    Home Living Family/patient expects to be discharged to:: Private residence Living Arrangements: Children (son) Available Help at Discharge: Family;Available PRN/intermittently (reports son works during the day but sets him up with everything he needs before he leaves) Type of Home: House Home Access: Ramped entrance        Home Layout: One level Home Equipment: Grab bars - tub/shower;Grab bars - toilet;Rolling Walker (2 wheels);Rollator (4 wheels);Shower seat;BSC/3in1      Prior Function Prior Level of Function : Independent/Modified Independent;Patient poor historian/Family not available             Mobility Comments: Pt reports owning both rollator and RW but recently has not used either. ADLs Comments: ind     Extremity/Trunk Assessment   Upper Extremity Assessment Upper Extremity Assessment: Defer to OT evaluation    Lower Extremity Assessment Lower Extremity Assessment: Generalized weakness    Cervical / Trunk Assessment Cervical / Trunk Assessment: Kyphotic  Communication   Communication Communication: No apparent difficulties    Cognition Arousal: Alert Behavior During Therapy: WFL for tasks assessed/performed   PT - Cognitive impairments: No family/caregiver present to determine baseline                         Following commands: Intact       Cueing Cueing Techniques: Verbal cues     General Comments General comments (skin integrity, edema, etc.): HR 90s while ambulating with RR between 20-22    Exercises     Assessment/Plan    PT Assessment Patient needs continued PT services  PT Problem List Decreased strength;Decreased activity tolerance;Decreased balance;Decreased mobility       PT Treatment Interventions DME instruction;Gait training;Functional mobility training;Therapeutic activities;Therapeutic exercise;Balance training    PT Goals (Current goals can be found in the Care Plan section)  Acute Rehab PT Goals Patient Stated Goal: To feel better PT Goal Formulation: With patient Time For Goal Achievement: 08/25/24 Potential to Achieve Goals: Good    Frequency Min 2X/week     Co-evaluation               AM-PAC PT 6 Clicks Mobility  Outcome Measure Help needed turning from your back to your side while in a flat bed without using  bedrails?: A Little Help needed moving from lying on your back to sitting on the side of a flat bed without using bedrails?: A Little Help needed moving to and from a bed to a chair (including a wheelchair)?: A Little Help needed standing up from a chair using your arms (e.g., wheelchair or bedside chair)?: A Little Help needed to walk in hospital room?: A Little Help needed climbing 3-5 steps with a railing? : A Lot 6 Click Score: 17    End of Session   Activity Tolerance: Patient tolerated treatment well Patient left: in chair;with call bell/phone within reach Nurse Communication: Mobility status PT Visit Diagnosis: Other abnormalities of gait and mobility (R26.89);History of falling (Z91.81);Muscle weakness (generalized) (M62.81)    Time: 8592-8573 PT Time Calculation (min) (ACUTE ONLY): 19 min   Charges:   PT Evaluation $PT Eval Low Complexity: 1 Low   PT General Charges $$ ACUTE PT VISIT: 1 Visit           Cella Cappello B Carlyon Nolasco 08/11/2024, 3:35 PM

## 2024-08-11 NOTE — Assessment & Plan Note (Deleted)
 Regular alcohol use with last drink 11/15. Liver enzymes and bili/direct bili elevated but overall improved. CTAP, RUQ US  *** US  with steatosis without significant liver findings.  - CIWAs ordered without Ativan ; 0s so far -> Discontinue CIWAs - Folic acid , thiamine , multivitamin

## 2024-08-11 NOTE — Assessment & Plan Note (Signed)
 Hx of dysphagia leading to PEG tube placement in 01/2024; tube still present. MBS 11/17 with moderate pharyngoesophageal dysphagia. Patient educated about risks of po, understood risks and wanted to continue to po. - Continue regular diet - Speech following, appreciate assistance in patient's care - RD consulted - Glucerna TID - Consider supplemental EN via PEG if po intake remains poor and pt agrees

## 2024-08-11 NOTE — Assessment & Plan Note (Signed)
 Hyponatremia: Na of 130, stable. Hypotension: Consider starting home midodrine  10 mg BID if BP drops with diuresis. BPH: Home Flomax  0.8 mg daily Cancer history: Lung cancer s/p resection, cholangiocarcinoma, CML; was on Dasatinib , though no fills since 07/2023 per med rec.

## 2024-08-11 NOTE — Assessment & Plan Note (Addendum)
 Exudative effusion concerning for CML per pulm. - Pulmonology following, appreciate recs - Flow cytology 11/18 pending - Expectorated sputum 11/18 pending - Fluid culture: No growth 2 days - Diurese as above

## 2024-08-11 NOTE — Assessment & Plan Note (Signed)
 Dry weight ~190 lb. Weight today of 216 lb. Out 925 mL yesterday. - Antibiotics: S/p PO Azithromycin  500 mg daily for 3 days (11/16-11/18) - Breathing tx: Home Breztri  (formulary equivalent of Trelegy Ellipta ) 2 puffs twice daily; DuoNebs q6h scheduled -> Switch DuoNebs to q6h prn - Prednisone  40 mg daily starting 11/17 to complete 5-day course (11/17-11/20) - S/p IV Solu-Medrol  once in ED 11/16 - Diuresis: Start IV 80 mg BID - S/p IV Lasix  60 mg once 11/16, IV Lasix  40 mg once 11/17, IV Lasix  80 mg once 11/18 - Holding Home torsemide  20 mg daily - Cough: Tessalon  pearls 100 mg TID prn, robitussin 5 mL q4h PRN - Pain: Norco 5-325 mg q6h PRN -> given concern over constipation, will SWITCH norco to tylenol  650 mg TID prn - Bowel regimen: Miralax  daily, senna at bedtime scheduled - Labs: AM CMP, CBC, Mag, retic count - Repleting electrolytes as needed - Strict I's/O's, daily weights - PT/OT eval - Reconsider level of care tomorrow pending response to aggressive diuresis

## 2024-08-11 NOTE — Assessment & Plan Note (Deleted)
 Dry weight ~190 lb. Weight today of 216 lb. Out 925 mL yesterday. - Antibiotics: S/p PO Azithromycin  500 mg daily for 3 days (11/16-11/18) - Breathing tx: Home Breztri (formulary equivalent of Trelegy Ellipta) 2 puffs twice daily; DuoNebs q6h scheduled -> ***q6h prn - Prednisone  40 mg daily starting 11/17 to complete 5-day course (11/17-11/20) - S/p IV Solu-Medrol once in ED 11/16 - Diuresis: IV 80 mg BID today - S/p IV Lasix  60 mg once 11/16, IV Lasix  40 mg once 11/17, IV Lasix  80 mg once 11/18 - Home torsemide  20 mg daily - Cough: Tessalon  pearls 100 mg TID prn, robitussin 5 mL q4h PRN - Pain: Norco 5-325 mg q6h PRN -> ***switch to tylenol  - Bowel regimen: Miralax  daily, senna at bedtime scheduled - Labs: AM CMP, CBC, Mag, retic count - Repleting electrolytes as needed - Strict I's/O's, daily weights - PT/OT eval - Reconsider level of care tomorrow pending response to aggressive diuresis

## 2024-08-11 NOTE — Progress Notes (Signed)
 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  He presented to [facility] emergency department 11/16 with complaints of shortness of breath and nonproductive cough x 3 days.  Evaluation in the emergency department included CT angiogram of the chest which was negative for PE. It did show bilateral pleural effusions R>L as well as a left lower lobe pulmonary nodule. He was admitted by the [facility] medicine teaching service for treatment of COPD exacerbation CHF exacerbation. Oncology called regarding pulmonary and adrenal nodule. PCCM consult was recommended for further recommendations.   Pertinent  Medical History   has a past medical history of Abdominal pain (09/13/2022), Abnormal colonoscopy (02/23/2021), Abnormal transaminases (02/27/2022), Acute diastolic (congestive) heart failure (HCC) (10/08/2023), Acute on chronic diastolic CHF (congestive heart failure) (HCC) (02/08/2024), Adenomatous polyp of descending colon (02/23/2021), Adverse drug reaction, initial encounter (07/04/2016), AKI (acute kidney injury) (12/02/2023), Alcohol abuse (12/02/2023), Alcohol withdrawal (HCC) (02/08/2024), Anemia in chronic illness (01/22/2013), Arcus senilis of both eyes (04/05/2022), Arthritis, degenerative (01/31/2015), Ascending aortic aneurysm (06/27/2022), Atherosclerosis of coronary artery without angina pectoris (02/19/2024), Benign fibroma of prostate (01/31/2015), Bowel sounds hyperactive (09/13/2022), BP (high blood pressure) (01/22/2013), BPH (benign prostatic hyperplasia), CAD (coronary artery disease), Cannot sleep  (01/31/2015), Carcinoma of biliary tract (HCC) (02/15/2013), Cataract, Cervical osteoarthritis (10/18/2014), Cholangiocarcinoma of biliary tract (HCC), Chronic cough (01/31/2015), Chronic myelogenous leukemia (HCC), Chronic pain (01/31/2015), CML (chronic myelocytic leukemia) (HCC) (10/18/2014), Cognitive communication disorder (02/19/2024), Compulsive tobacco user syndrome (01/31/2015), Congestive heart failure (CHF) (HCC) (09/26/2023), Constipation (09/13/2022), COPD (chronic obstructive pulmonary disease) (HCC), COPD with asthma (HCC) (01/22/2013), Diuretic-induced hypokalemia (05/23/2023), DJD (degenerative joint disease), Dyslipidemia, Dyspnea (09/18/2023), Edema (10/01/2023), Electrocardiogram abnormal (08/23/2022), Encounter for examination of eyes and vision without abnormal findings (08/23/2022), Family history of colonic polyps, Family history of prostate cancer, Full thickness rotator cuff tear (08/08/2020), Gastroesophageal reflux disease without esophagitis (02/19/2024), Gastrostomy present (HCC) (02/20/2024), Gout, High coronary artery calcium score (05/30/2023), History of cholangiocarcinoma (02/15/2013), History of colon polyps (02/23/2021), History of colonic polyps (10/01/2021), HLD (hyperlipidemia) (01/31/2015), HTN (hypertension), Hypercalcemia (08/10/2020), Hypercholesterolemia, Hypocalcemia (08/27/2021), Hypokalemia (12/02/2023), Hyponatremia (09/19/2023), Hypotension (12/02/2023), Impingement syndrome of both shoulders (03/31/2020), Iron  deficiency anemia due to chronic blood loss (03/12/2024), Lung cancer (HCC), Malnutrition of moderate degree (02/11/2024), Metabolic acidosis (12/02/2023), Mild intermittent asthma (02/03/2024), Muscle weakness (02/20/2024), Myelogenous leukemia (HCC), Need for assistance with personal care (02/20/2024), Non-small cell carcinoma of lung (HCC) (01/31/2015), OA (osteoarthritis), Oropharyngeal dysphagia (02/19/2024), Orthostatic hypotension (10/10/2023), Other  dietary vitamin B12 deficiency anemia (10/17/2023), Other specified health status (08/23/2022), PAD (peripheral artery disease), Personal history of gastric ulcers (02/23/2021), Personal history of gastric ulcers (02/23/2021), Pleural effusion (10/31/2023), Pneumonia (09/18/2023), Pneumonitis due to inhalation of regurgitated food (HCC) (02/19/2024), Proteinuria (08/23/2022), Reduced mobility (02/20/2024), Renal insufficiency, Right lower lobe pneumonia (02/05/2024), Shock (HCC) (12/02/2023), Shoulder pain, left (03/29/2020), Shoulder pain, right (03/29/2020), Tendonitis, Thoracic aortic aneurysm (06/27/2022), Tobacco use (08/23/2022), Type 2 diabetes mellitus with hyperlipidemia (HCC), Unsteadiness on feet (02/20/2024), Vitamin D  deficiency (02/03/2024), and Wheezing (01/31/2015).   Significant Hospital Events: Including procedures, antibiotic start and stop dates in addition to other pertinent events   11/16 admit 11/17 s/p thoracentesis  Interim History /  Subjective:  No distress On room air  Objective    Blood pressure 134/80, pulse 82, temperature 97.8 F (36.6 C), temperature source Oral, resp. rate 19, height 5' 11 (1.803 m), weight 98 kg, SpO2 99%.        Intake/Output Summary (Last 24 hours) at 08/11/2024 1558 Last data filed at 08/11/2024 1300 Gross per 24 hour  Intake 517 ml  Output 500 ml  Net 17 ml   Filed Weights   08/09/24 0405 08/10/24 0346 08/11/24 0500  Weight: 97.5 kg 98.4 kg 98 kg    Examination:  General:   NAD HEENT: MM pink/moist Neuro: Aox3; MAE CV: s1s2, RRR, no m/r/g PULM:  dim clear BS bilaterally; room air GI: soft, bsx4 active  Extremities: warm/dry, no edema  Skin: no rashes or lesions    Resolved problem list    Assessment and Plan   Acute exacerbation of COPD: no PFT in our system since 2016.  Plan: -triple therapy nebs; prn duoneb -steroids -pulm toiletry: IS/flutter  Bilateral pleural effusions: R>L s/p right sided thoracentesis  11/17 800 cc serosanguinous fluids -lights criteria concerning for exudative effusion - Dasatinib  is known to cause effusions, so this is what they have been attributed to in the past.  Plan: -check cxr -follow up pleural cultures and cytology -cont diuresis per primary  Pulmonary nodules;  - primary team has discussed with oncology. No real concern for CML progression.  Plan: -OP pulm f/u requested  CML Dysphagia - per primary   Labs   CBC: Recent Labs  Lab 08/08/24 0930 08/09/24 0315 08/10/24 0337 08/10/24 0704 08/11/24 0249  WBC 7.4 4.3 6.9  --  5.1  NEUTROABS  --   --  5.4  --   --   HGB 10.9* 9.7* 7.9* 7.9* 7.7*  HCT 31.9* 28.2* 23.1* 23.1* 23.3*  MCV 87.2 87.0 88.5  --  90.0  PLT 102* 106* 130*  --  130*    Basic Metabolic Panel: Recent Labs  Lab 08/08/24 0930 08/09/24 0315 08/10/24 0337 08/11/24 0249  NA 128* 127* 129* 130*  K 3.5 3.6 3.8 4.1  CL 97* 96* 99 96*  CO2 19* 20* 20* 23  GLUCOSE 119* 284* 177* 141*  BUN 9 11 16 12   CREATININE 0.71 0.84 1.03 0.78  CALCIUM 7.5* 7.2* 7.2* 7.7*  MG  --  1.8 1.9 2.1   GFR: Estimated Creatinine Clearance: 96.7 mL/min (by C-G formula based on SCr of 0.78 mg/dL). Recent Labs  Lab 08/08/24 0930 08/09/24 0315 08/10/24 0337 08/11/24 0249  WBC 7.4 4.3 6.9 5.1    Liver Function Tests: Recent Labs  Lab 08/09/24 0315 08/10/24 0337 08/11/24 0249  AST 438* 297* 230*  ALT 383* 317* 287*  ALKPHOS 416* 328* 308*  BILITOT 2.9* 1.8* 1.6*  PROT 5.6* 5.3* 5.5*  ALBUMIN  2.2* 2.2* 2.4*   No results for input(s): LIPASE, AMYLASE in the last 168 hours. No results for input(s): AMMONIA in the last 168 hours.  ABG    Component Value Date/Time   HCO3 24.1 02/05/2024 1554   TCO2 25 02/05/2024 1554   ACIDBASEDEF 1.0 02/05/2024 1554   O2SAT 54 02/05/2024 1554     Coagulation Profile: Recent Labs  Lab 08/10/24 0337  INR 1.2    Cardiac Enzymes: No results for input(s): CKTOTAL, CKMB, CKMBINDEX,  TROPONINI in the last 168 hours.  HbA1C: Hemoglobin A1C  Date/Time Value Ref Range Status  12/13/2022 12:00 AM 4.5  Final   Hgb A1c MFr Bld  Date/Time Value Ref Range Status  02/06/2024 03:44 AM 4.6 (L) 4.8 - 5.6 % Final    Comment:    (NOTE) Pre diabetes:          5.7%-6.4%  Diabetes:              >6.4%  Glycemic control for   <7.0% adults with diabetes   09/25/2020 09:17 AM 4.7 (L) 4.8 - 5.6 % Final    Comment:    (NOTE) Pre diabetes:          5.7%-6.4%  Diabetes:              >6.4%  Glycemic control for   <7.0% adults with diabetes     CBG: No results for input(s): GLUCAP in the last 168 hours.  Review of Systems:    Bolds are positive  Constitutional: weight loss, gain, night sweats, Fevers, chills, fatigue .  HEENT: headaches, Sore throat, sneezing, nasal congestion, post nasal drip, Difficulty swallowing, Tooth/dental problems, visual complaints visual changes, ear ache CV:  chest pain, radiates:,Orthopnea, PND, swelling in lower extremities, dizziness, palpitations, syncope.  GI  heartburn, indigestion, abdominal pain, nausea, vomiting, diarrhea, change in bowel habits, loss of appetite, bloody stools.  Resp: cough, nonproductive: , hemoptysis, dyspnea, chest pain, pleuritic.  Skin: rash or itching or icterus GU: dysuria, change in color of urine, urgency or frequency. flank pain, hematuria  MS: joint pain or swelling. decreased range of motion  Psych: change in mood or affect. depression or anxiety.  Neuro: difficulty with speech, weakness, numbness, ataxia    Past Medical History:  He,  has a past medical history of Abdominal pain (09/13/2022), Abnormal colonoscopy (02/23/2021), Abnormal transaminases (02/27/2022), Acute diastolic (congestive) heart failure (HCC) (10/08/2023), Acute on chronic diastolic CHF (congestive heart failure) (HCC) (02/08/2024), Adenomatous polyp of descending colon (02/23/2021), Adverse drug reaction, initial encounter  (07/04/2016), AKI (acute kidney injury) (12/02/2023), Alcohol abuse (12/02/2023), Alcohol withdrawal (HCC) (02/08/2024), Anemia in chronic illness (01/22/2013), Arcus senilis of both eyes (04/05/2022), Arthritis, degenerative (01/31/2015), Ascending aortic aneurysm (06/27/2022), Atherosclerosis of coronary artery without angina pectoris (02/19/2024), Benign fibroma of prostate (01/31/2015), Bowel sounds hyperactive (09/13/2022), BP (high blood pressure) (01/22/2013), BPH (benign prostatic hyperplasia), CAD (coronary artery disease), Cannot sleep (01/31/2015), Carcinoma of biliary tract (HCC) (02/15/2013), Cataract, Cervical osteoarthritis (10/18/2014), Cholangiocarcinoma of biliary tract (HCC), Chronic cough (01/31/2015), Chronic myelogenous leukemia (HCC), Chronic pain (01/31/2015), CML (chronic myelocytic leukemia) (HCC) (10/18/2014), Cognitive communication disorder (02/19/2024), Compulsive tobacco user syndrome (01/31/2015), Congestive heart failure (CHF) (HCC) (09/26/2023), Constipation (09/13/2022), COPD (chronic obstructive pulmonary disease) (HCC), COPD with asthma (HCC) (01/22/2013), Diuretic-induced hypokalemia (05/23/2023), DJD (degenerative joint disease), Dyslipidemia, Dyspnea (09/18/2023), Edema (10/01/2023), Electrocardiogram abnormal (08/23/2022), Encounter for examination of eyes and vision without abnormal findings (08/23/2022), Family history of colonic polyps, Family history of prostate cancer, Full thickness rotator cuff tear (08/08/2020), Gastroesophageal reflux disease without esophagitis (02/19/2024), Gastrostomy present (HCC) (02/20/2024), Gout, High coronary artery calcium score (05/30/2023), History of cholangiocarcinoma (02/15/2013), History of colon polyps (02/23/2021), History of colonic polyps (10/01/2021), HLD (hyperlipidemia) (01/31/2015), HTN (hypertension), Hypercalcemia (08/10/2020), Hypercholesterolemia, Hypocalcemia (08/27/2021), Hypokalemia (12/02/2023), Hyponatremia  (09/19/2023), Hypotension (12/02/2023), Impingement syndrome of both shoulders (03/31/2020), Iron  deficiency anemia due to chronic blood loss (03/12/2024), Lung cancer (HCC), Malnutrition of moderate degree (02/11/2024), Metabolic acidosis (12/02/2023), Mild intermittent asthma (02/03/2024), Muscle weakness (02/20/2024), Myelogenous leukemia (HCC), Need for assistance with personal care (02/20/2024), Non-small cell carcinoma of lung (HCC) (01/31/2015), OA (osteoarthritis), Oropharyngeal dysphagia (02/19/2024), Orthostatic hypotension (10/10/2023), Other dietary vitamin B12 deficiency anemia (10/17/2023), Other specified  health status (08/23/2022), PAD (peripheral artery disease), Personal history of gastric ulcers (02/23/2021), Personal history of gastric ulcers (02/23/2021), Pleural effusion (10/31/2023), Pneumonia (09/18/2023), Pneumonitis due to inhalation of regurgitated food (HCC) (02/19/2024), Proteinuria (08/23/2022), Reduced mobility (02/20/2024), Renal insufficiency, Right lower lobe pneumonia (02/05/2024), Shock (HCC) (12/02/2023), Shoulder pain, left (03/29/2020), Shoulder pain, right (03/29/2020), Tendonitis, Thoracic aortic aneurysm (06/27/2022), Tobacco use (08/23/2022), Type 2 diabetes mellitus with hyperlipidemia (HCC), Unsteadiness on feet (02/20/2024), Vitamin D  deficiency (02/03/2024), and Wheezing (01/31/2015).   Surgical History:   Past Surgical History:  Procedure Laterality Date   BIOPSY  04/30/2021   Procedure: BIOPSY;  Surgeon: Wilhelmenia Aloha Raddle., MD;  Location: [facility];  Service: Gastroenterology;;   Bowel duct surg  2014   Bowel cancer   CARDIAC CATHETERIZATION  2000   CHOLECYSTECTOMY     COLONOSCOPY  06/23/2015   mild sigmoid diverticulosis. small external and internal hemorrhoids. otherwise normal colonoscopy to terminal ileum   COLONOSCOPY WITH PROPOFOL  N/A 04/30/2021   Procedure: COLONOSCOPY WITH PROPOFOL ;  Surgeon: Wilhelmenia Aloha Raddle., MD;  Location: [facility];  Service: Gastroenterology;  Laterality: N/A;   ENDOSCOPIC MUCOSAL RESECTION N/A 04/30/2021   Procedure: ENDOSCOPIC MUCOSAL RESECTION;  Surgeon: Wilhelmenia Aloha Raddle., MD;  Location: [facility];  Service: Gastroenterology;  Laterality: N/A;   ESOPHAGOGASTRODUODENOSCOPY (EGD) WITH PROPOFOL  N/A 04/30/2021   Procedure: ESOPHAGOGASTRODUODENOSCOPY (EGD) WITH PROPOFOL ;  Surgeon: Wilhelmenia Aloha Raddle., MD;  Location: [facility];  Service: Gastroenterology;  Laterality: N/A;   HEMOSTASIS CLIP PLACEMENT  04/30/2021   Procedure: HEMOSTASIS CLIP PLACEMENT;  Surgeon: Wilhelmenia Aloha Raddle., MD;  Location: [facility];  Service: Gastroenterology;;   IR GASTROSTOMY TUBE MOD SED  02/17/2024   IR THORACENTESIS ASP PLEURAL SPACE W/IMG GUIDE  08/09/2024   LUNG CANCER SURGERY  09/2014   partial lobectomy   POLYPECTOMY  04/30/2021   Procedure: POLYPECTOMY;  Surgeon: Mansouraty, Aloha Raddle., MD;  Location: [facility];  Service: Gastroenterology;;   SHOULDER OPEN ROTATOR CUFF REPAIR Right 09/27/2020   Procedure: Right shoulder mini open rotator cuff repair, distal clavicle resection;  Surgeon: Duwayne Purchase, MD;  Location: [facility];  Service: Orthopedics;  Laterality: Right;  90 mins  Choice with Block anesthesia   SUBMUCOSAL LIFTING INJECTION  04/30/2021   Procedure: SUBMUCOSAL LIFTING INJECTION;  Surgeon: Wilhelmenia Aloha Raddle., MD;  Location: [facility];  Service: Gastroenterology;;   UPPER GASTROINTESTINAL ENDOSCOPY     WHIPPLE PROCEDURE  02/25/2013   Procedure: WHIPPLE PROCEDURE; Surgeon: Abran Kallman, MD; Location: [facility] MAIN OR; Service: General; Laterality: N/A;     Social History:   reports that he has quit smoking. His smoking use included cigarettes. He has a 10 pack-year smoking history. He has never used smokeless tobacco. He reports current alcohol use of about 21.0 standard drinks of alcohol per week. He reports that he does not use drugs.   Family History:  His family history  includes Asthma in his sister; Cancer in his half-sister and paternal grandfather; Hypertension in his brother, mother, and sister; Prostate cancer (age of onset: 61) in his father. There is no history of Esophageal cancer, Colon polyps, Colon cancer, Stomach cancer, Rectal cancer, Inflammatory bowel disease, Liver disease, or Pancreatic cancer.   Allergies Allergies  Allergen Reactions   Pneumococcal Vaccines Swelling     Home Medications  Prior to Admission medications   Medication Sig Start Date End Date Taking? Authorizing Provider  albuterol  (PROVENTIL  HFA;VENTOLIN  HFA) 108 (90 BASE) MCG/ACT inhaler Inhale 2 puffs into the lungs every 6 (six) hours as needed  for wheezing or shortness of breath.   Yes [provider]  albuterol  (PROVENTIL ) (2.5 MG/3ML) 0.083% nebulizer solution Take 2.5 mg by nebulization every 4 (four) hours as needed for wheezing or shortness of breath. 07/19/24  Yes [provider]  allopurinol  (ZYLOPRIM ) 300 MG tablet Place 1 tablet (300 mg total) into feeding tube daily. Patient taking differently: Take 300 mg by mouth daily. 02/18/24  Yes Fairy Frames, MD  ascorbic acid (VITAMIN C) 500 MG tablet Take 500 mg by mouth daily.   Yes [provider]  aspirin  EC 81 MG tablet Take 1 tablet (81 mg total) by mouth daily. Swallow whole. 02/18/24  Yes Fairy Frames, MD  Calcium Carb-Cholecalciferol (CALCIUM 600+D3) 600-5 MG-MCG TABS Take 2 tablets by mouth daily.   Yes [provider]  Cholecalciferol (VITAMIN D ) 50 MCG (2000 UT) tablet Place 1 tablet (2,000 Units total) into feeding tube daily. Patient taking differently: Take 2,000 Units by mouth daily. 02/18/24  Yes Fairy Frames, MD  dasatinib  (SPRYCEL ) 100 MG tablet Place 1 tablet (100 mg total) into feeding tube daily. Patient taking differently: Take 100 mg by mouth daily. 02/18/24  Yes Fairy Frames, MD  famotidine  (PEPCID ) 40 MG tablet Place 1 tablet (40 mg total) into feeding  tube daily. Patient taking differently: Take 40 mg by mouth daily. 02/18/24  Yes Fairy Frames, MD  Ferrous Sulfate 28 MG TABS Take 1 tablet by mouth daily.   Yes [provider]  folic acid  (FOLVITE ) 1 MG tablet Place 1 tablet (1 mg total) into feeding tube daily. Patient taking differently: Take 1 mg by mouth daily. 02/18/24  Yes Fairy Frames, MD  methocarbamol  (ROBAXIN ) 750 MG tablet Place 1 tablet (750 mg total) into feeding tube every 6 (six) hours as needed for muscle spasms. Patient taking differently: Take 750 mg by mouth every 6 (six) hours as needed for muscle spasms. 02/18/24  Yes Fairy Frames, MD  midodrine  (PROAMATINE ) 5 MG tablet Place 2 tablets (10 mg total) into feeding tube 2 (two) times daily with a meal. Patient taking differently: Take 10 mg by mouth 2 (two) times daily with a meal. 02/18/24  Yes Fairy Frames, MD  Multiple Vitamin (MULTIVITAMIN WITH MINERALS) TABS tablet Place 1 tablet into feeding tube daily. Patient taking differently: Take 1 tablet by mouth daily. 02/18/24  Yes Fairy Frames, MD  potassium chloride  SA (KLOR-CON  M) 20 MEQ tablet Take 20 mEq by mouth 2 (two) times daily.   Yes [provider]  tamsulosin  (FLOMAX ) 0.4 MG CAPS capsule Take 2 capsules (0.8 mg total) by mouth daily. 02/18/24  Yes Fairy Frames, MD  torsemide  (DEMADEX ) 20 MG tablet Take 20 mg by mouth daily.   Yes [provider]  TRELEGY ELLIPTA 100-62.5-25 MCG/INH AEPB Inhale 1 puff into the lungs daily as needed (Asthma). 12/20/20  Yes [provider]          JD Emilio RIGGERS [facility] 08/11/2024, 4:09 PM  Please see [email or web address] for pager details.  From 7A-7P if no response, please call [phone]. After hours, please call ELink [phone].

## 2024-08-11 NOTE — Evaluation (Signed)
 Occupational Therapy Evaluation Patient Details Name: Kyle Montoya MRN: [id number] DOB: [birth year] Today's Date: 08/11/2024   History of Present Illness   73 y/o M presenting to ED on 11/16 with difficulty breathing and cough, admitted for dypsnea. Also requesting removal of feeding tube. Thoracentesis 11/17 yielded fluid.    PMH includes CHF, COPD, EtOH abuse, dysphagia s/p PEG, multiple cancers, HTN, AAA     Clinical Impressions Pt reports ind at baseline with ADLs and functional mobility, lives with son who works during the day but provides PRN assist. Pt currently needs up to mod A for ADLs, minA for bed mobility and min A for transfers with RW. Pt with audible DOE after ambulation to and from bathroom, SpO2 remained in mid 90s throughout on RA. Pt presenting with impairments listed below, will follow acutely. Recommend HHOT at d/c.      If plan is discharge home, recommend the following:   A little help with walking and/or transfers;A little help with bathing/dressing/bathroom;Assistance with cooking/housework;Direct supervision/assist for financial management;Direct supervision/assist for medications management;Assist for transportation;Help with stairs or ramp for entrance     Functional Status Assessment   Patient has had a recent decline in their functional status and demonstrates the ability to make significant improvements in function in a reasonable and predictable amount of time.     Equipment Recommendations   None recommended by OT     Recommendations for Other Services   PT consult     Precautions/Restrictions   Precautions Precautions: Fall Restrictions Weight Bearing Restrictions Per Provider Order: No     Mobility Bed Mobility Overal bed mobility: Needs Assistance Bed Mobility: Sidelying to Sit   Sidelying to sit: Min assist       General bed mobility comments: truncal assist to EOB    Transfers Overall transfer level: Needs  assistance Equipment used: Rolling walker (2 wheels) Transfers: Sit to/from Stand Sit to Stand: Min assist           General transfer comment: min A from regular bed height      Balance Overall balance assessment: Needs assistance Sitting-balance support: Feet supported Sitting balance-Leahy Scale: Good     Standing balance support: During functional activity, Reliant on assistive device for balance Standing balance-Leahy Scale: Fair Standing balance comment: reliant on external support                           ADL either performed or assessed with clinical judgement   ADL Overall ADL's : Needs assistance/impaired Eating/Feeding: Set up   Grooming: Set up   Upper Body Bathing: Minimal assistance   Lower Body Bathing: Moderate assistance   Upper Body Dressing : Minimal assistance   Lower Body Dressing: Moderate assistance   Toilet Transfer: Minimal assistance;Ambulation;Rolling walker (2 wheels)   Toileting- Clothing Manipulation and Hygiene: Minimal assistance       Functional mobility during ADLs: Minimal assistance;Rolling walker (2 wheels)       Vision   Vision Assessment?: No apparent visual deficits     Perception Perception: Not tested       Praxis Praxis: Not tested       Pertinent Vitals/Pain Pain Assessment Pain Assessment: Faces Pain Score: 3  Faces Pain Scale: Hurts little more Pain Location: chest from coughing Pain Descriptors / Indicators: Discomfort Pain Intervention(s): Limited activity within patient's tolerance, Monitored during session, Repositioned     Extremity/Trunk Assessment Upper Extremity Assessment Upper Extremity Assessment: Generalized weakness (initiation  tremor noted in LUE with reaching for items in room)   Lower Extremity Assessment Lower Extremity Assessment: Defer to PT evaluation   Cervical / Trunk Assessment Cervical / Trunk Assessment: Kyphotic   Communication Communication Communication:  No apparent difficulties   Cognition Arousal: Alert Behavior During Therapy: WFL for tasks assessed/performed Cognition: No apparent impairments                               Following commands: Intact       Cueing  General Comments   Cueing Techniques: Verbal cues  VSS on RA, audible DOE with short distance mobility to bathroom and back   Exercises     Shoulder Instructions      Home Living Family/patient expects to be discharged to:: Private residence Living Arrangements: Children (son) Available Help at Discharge: Family;Available PRN/intermittently Type of Home: House Home Access: Ramped entrance     Home Layout: One level     Bathroom Shower/Tub: Chief Strategy Officer: Handicapped height     Home Equipment: Grab bars - tub/shower;Grab bars - toilet;Rolling Walker (2 wheels);Rollator (4 wheels);Shower seat;BSC/3in1          Prior Functioning/Environment Prior Level of Function : Independent/Modified Independent;Patient poor historian/Family not available             Mobility Comments: no AD use ADLs Comments: ind    OT Problem List: Decreased strength;Decreased range of motion;Decreased activity tolerance;Impaired balance (sitting and/or standing)   OT Treatment/Interventions: Self-care/ADL training;Therapeutic exercise;Energy conservation;DME and/or AE instruction;Therapeutic activities;Patient/family education;Balance training      OT Goals(Current goals can be found in the care plan section)   Acute Rehab OT Goals Patient Stated Goal: did not state OT Goal Formulation: With patient Time For Goal Achievement: 08/25/24 Potential to Achieve Goals: Good ADL Goals Pt Will Perform Upper Body Dressing: with modified independence;sitting Pt Will Perform Lower Body Dressing: with contact guard assist;sitting/lateral leans;sit to/from stand Pt Will Transfer to Toilet: with contact guard assist;ambulating;regular height  toilet Pt Will Perform Tub/Shower Transfer: Tub transfer;Shower transfer;with contact guard assist;ambulating;rolling walker Additional ADL Goal #1: pt will verbalize x3 energy conservation strategies in prep for ADLs   OT Frequency:  Min 2X/week    Co-evaluation              AM-PAC OT 6 Clicks Daily Activity     Outcome Measure Help from another person eating meals?: A Little Help from another person taking care of personal grooming?: A Little Help from another person toileting, which includes using toliet, bedpan, or urinal?: A Little Help from another person bathing (including washing, rinsing, drying)?: A Lot Help from another person to put on and taking off regular upper body clothing?: A Little Help from another person to put on and taking off regular lower body clothing?: A Lot 6 Click Score: 16   End of Session Equipment Utilized During Treatment: Rolling walker (2 wheels) Nurse Communication: Mobility status  Activity Tolerance: Patient tolerated treatment well Patient left: in bed;with call bell/phone within reach;with bed alarm set (seated EOB)  OT Visit Diagnosis: Unsteadiness on feet (R26.81);Other abnormalities of gait and mobility (R26.89);Muscle weakness (generalized) (M62.81)                Time: 9089-9067 OT Time Calculation (min): 22 min Charges:  OT General Charges $OT Visit: 1 Visit OT Evaluation $OT Eval Moderate Complexity: 1 Mod  Adair Lauderback K, OTD, OTR/L SecureChat Preferred  Acute Rehab [phone]   Laneta MARLA Pereyra 08/11/2024, 9:57 AM

## 2024-08-11 NOTE — Assessment & Plan Note (Signed)
 Regular alcohol use with last drink 11/15. Liver enzymes and bili/direct bili elevated but overall improved. CTAP without liver findings; RUQ US  suggestive of hepatic steatosis. Suspect in setting of alcohol use. - CIWAs ordered without Ativan ; 0s so far -> Discontinue CIWAs - Folic acid , thiamine , multivitamin

## 2024-08-11 NOTE — Assessment & Plan Note (Addendum)
 Regular alcohol use with last drink 11/15. Liver enzymes and bili/direct bili elevated but overall improved. CTAP without liver findings; RUQ US  suggestive of hepatic steatosis. Suspect in setting of alcohol use. - CIWAs ordered without Ativan ; 0s so far -> Discontinue CIWAs - Folic acid , thiamine , multivitamin

## 2024-08-11 NOTE — Progress Notes (Signed)
 Daily Progress Note Intern Pager: [phone]  Patient name: Kyle Montoya Medical record number: [id number] Date of birth: December 08, 1950 Age: 73 y.o. Gender: male  Primary Care Provider: Nestor Elston NOVAK, NP Consultants: Oncology (messaged only), Pulmonology  Code Status: DNR/DNI  Pt Overview and Major Events to Date:  - 11/16: Admitted - 11/17: Thoracentesis, pulm consulted  Medical Decision Making:  Kyle Montoya is a 73 y.o. male with PMH of CHF(?), COPD, EtOH abuse, hx dysphagia s/p PEG (01/2024), history of multiple cancers (lung cancer sp resection, CML, cholangiocarcinoma), HTN, ascending aortic aneurysm, CAD, IDA. Admitted for cough and dyspnea 2/2 COPD exacerbation vs CHF exacerbation.   Also found to have exudative effusion concerning for malignancy. Pulm following, concerned for CML with flow cytology pending. Patient also with LLL lung nodule, pulm planning for follow-up outpatient. Assessment & Plan Acute on chronic diastolic heart failure (HCC) Dyspnea COPD exacerbation (HCC) Dry weight ~190 lb. Weight today of 216 lb. Out 925 mL yesterday. - Antibiotics: S/p PO Azithromycin  500 mg daily for 3 days (11/16-11/18) - Breathing tx: Home Breztri  (formulary equivalent of Trelegy Ellipta ) 2 puffs twice daily; DuoNebs q6h scheduled -> Switch DuoNebs to q6h prn - Prednisone  40 mg daily starting 11/17 to complete 5-day course (11/17-11/20) - S/p IV Solu-Medrol  once in ED 11/16 - Diuresis: Start IV 80 mg BID - S/p IV Lasix  60 mg once 11/16, IV Lasix  40 mg once 11/17, IV Lasix  80 mg once 11/18 - Holding Home torsemide  20 mg daily - Cough: Tessalon  pearls 100 mg TID prn, robitussin 5 mL q4h PRN - Pain: Norco 5-325 mg q6h PRN -> given concern over constipation, will SWITCH norco to tylenol  650 mg TID prn - Bowel regimen: Miralax  daily, senna at bedtime scheduled - Labs: AM CMP, CBC, Mag, retic count - Repleting electrolytes as needed - Strict I's/O's, daily weights - PT/OT  eval - Reconsider level of care tomorrow pending response to aggressive diuresis Pleural effusion Pulmonary nodule Exudative effusion concerning for CML per pulm. - Pulmonology following, appreciate recs - Flow cytology 11/18 pending - Expectorated sputum 11/18 pending - Fluid culture: No growth 2 days - Diurese as above Anemia in chronic illness Hgb of 7.7 today from 7.9 yesterday. Hx of CAD. Ferritin normal (284) in 01/2024. Suspect anemia related to liver disease. - Will transfuse 1u pRBCs - Recheck H&H post-transfusion - Will check retic count tomorrow am - Transfusion threshold Hgb < 8 Dysphagia Presence of externally removable percutaneous endoscopic gastrostomy (PEG) tube (HCC) Malnutrition of moderate degree Hx of dysphagia leading to PEG tube placement in 01/2024; tube still present. MBS 11/17 with moderate pharyngoesophageal dysphagia. Patient educated about risks of po, understood risks and wanted to continue to po. - Continue regular diet - Speech following, appreciate assistance in patient's care - RD consulted - Glucerna TID - Consider supplemental EN via PEG if po intake remains poor and pt agrees Unhealthy alcohol drinking behavior Hepatic steatosis Regular alcohol use with last drink 11/15. Liver enzymes and bili/direct bili elevated but overall improved. CTAP without liver findings; RUQ US  suggestive of hepatic steatosis. Suspect in setting of alcohol use. - CIWAs ordered without Ativan ; 0s so far -> Discontinue CIWAs - Folic acid , thiamine , multivitamin Concern about memory; suspect Dementia On chart review, concerns for memory impairment per family in prior notes. Dementia labs so far normal; high B12, normal TSH. Pt was getting B12 injection until 04/2024. CT head without acute abnormalities. Suspect dementia based on CT findings of microvascular disease  bridging from lateral horns along lateral ventricle. - No further workup indicated inpatient Bilateral lower  extremity edema (Resolved: 08/11/2024) No DVT per bilateral lower extremity US . Patient reports some baseline edema R>L. No further workup indicated. - Diurese as above Chronic health problem Hyponatremia: Na of 130, stable. Hypotension: Consider starting home midodrine  10 mg BID if BP drops with diuresis. BPH: Home Flomax  0.8 mg daily Cancer history: Lung cancer s/p resection, cholangiocarcinoma, CML; was on Dasatinib , though no fills since 07/2023 per med rec.   FEN/GI: Regular PPx: Lovenox  Dispo: Home with home health hopefully tomorrow. Barriers include continued need for IV diuresis, continued monitoring of hemoglobin level.   Subjective:  Reports he is feeling better overall with improved breathing and cough.  He still has some dyspnea and cough.  He reports he has not had a bowel movement in about a week; his last bowel movement was not painful and there was no blood present.  No reported concerns otherwise he does report he would be okay with receiving blood products as necessary.  Objective: Temp:  [97.5 F (36.4 C)-98.3 F (36.8 C)] 98.3 F (36.8 C) (11/19 0315) Pulse Rate:  [82-100] 84 (11/19 0731) Resp:  [12-26] 20 (11/19 0731) BP: (100-143)/(47-77) 100/47 (11/19 0315) SpO2:  [97 %-100 %] 97 % (11/19 0731) Weight:  [98 kg] 98 kg (11/19 0500) Physical Exam: General: Patient sitting up in bed eating breakfast, no acute distress. Cardiovascular: Regular rate and rhythm, no murmurs/rubs/gallops. Respiratory: Normal work of breathing on room air.  Diffuse wheezes throughout lung fields, expiratory > inspiratory, improved from yesterday. Abdomen: Bowel sounds present and normoactive bilaterally. Soft.  Tenderness around umbilicus, no rebound, no guarding. Somewhat distended. Extremities: Skin warm, dry.  Minimal bilateral pitting lower extremity edema.  Laboratory: Most recent CBC Lab Results  Component Value Date   WBC 5.1 08/11/2024   HGB 7.7 (L) 08/11/2024   HCT 23.3  (L) 08/11/2024   MCV 90.0 08/11/2024   PLT 130 (L) 08/11/2024   Most recent BMP    Latest Ref Rng & Units 08/11/2024    2:49 AM  BMP  Glucose 70 - 99 mg/dL 858   BUN 8 - 23 mg/dL 12   Creatinine 9.38 - 1.24 mg/dL 9.21   Sodium 864 - 854 mmol/L 130   Potassium 3.5 - 5.1 mmol/L 4.1   Chloride 98 - 111 mmol/L 96   CO2 22 - 32 mmol/L 23   Calcium 8.9 - 10.3 mg/dL 7.7   Mag 2.1  Imaging/Diagnostic Tests: CT Head 11/19: No evidence of acute intracranial abnormality or reversible cause of memory loss.  Larraine Palma, MD 08/11/2024, 7:38 AM  PGY-1, [facility] FPTS Intern pager: [phone], text pages welcome Secure chat group [facility] Teaching Service

## 2024-08-11 NOTE — Plan of Care (Signed)
   Problem: Nutrition: Goal: Adequate nutrition will be maintained Outcome: Progressing

## 2024-08-12 ENCOUNTER — Inpatient Hospital Stay (HOSPITAL_COMMUNITY)

## 2024-08-12 DIAGNOSIS — I5033 Acute on chronic diastolic (congestive) heart failure: Secondary | ICD-10-CM | POA: Diagnosis not present

## 2024-08-12 LAB — RETICULOCYTES
Immature Retic Fract: 24.7 % — ABNORMAL HIGH (ref 2.3–15.9)
RBC.: 2.97 MIL/uL — ABNORMAL LOW (ref 4.22–5.81)
Retic Count, Absolute: 70.7 K/uL (ref 19.0–186.0)
Retic Ct Pct: 2.4 % (ref 0.4–3.1)

## 2024-08-12 LAB — MAGNESIUM: Magnesium: 1.7 mg/dL (ref 1.7–2.4)

## 2024-08-12 LAB — COMPREHENSIVE METABOLIC PANEL WITH GFR
ALT: 239 U/L — ABNORMAL HIGH (ref 0–44)
AST: 162 U/L — ABNORMAL HIGH (ref 15–41)
Albumin: 2.3 g/dL — ABNORMAL LOW (ref 3.5–5.0)
Alkaline Phosphatase: 264 U/L — ABNORMAL HIGH (ref 38–126)
Anion gap: 9 (ref 5–15)
BUN: 10 mg/dL (ref 8–23)
CO2: 27 mmol/L (ref 22–32)
Calcium: 7.6 mg/dL — ABNORMAL LOW (ref 8.9–10.3)
Chloride: 94 mmol/L — ABNORMAL LOW (ref 98–111)
Creatinine, Ser: 0.73 mg/dL (ref 0.61–1.24)
GFR, Estimated: >60 mL/min (ref >60)
Glucose, Bld: 124 mg/dL — ABNORMAL HIGH (ref 70–99)
Potassium: 3.3 mmol/L — ABNORMAL LOW (ref 3.5–5.1)
Sodium: 130 mmol/L — ABNORMAL LOW (ref 135–145)
Total Bilirubin: 1.7 mg/dL — ABNORMAL HIGH (ref 0.0–1.2)
Total Protein: 5.4 g/dL — ABNORMAL LOW (ref 6.5–8.1)

## 2024-08-12 LAB — BODY FLUID CULTURE W GRAM STAIN: Culture: NO GROWTH

## 2024-08-12 LAB — BPAM RBC
Blood Product Expiration Date: 202512182359
ISSUE DATE / TIME: 202511191515
Unit Type and Rh: 7300

## 2024-08-12 LAB — CBC
HCT: 26.8 % — ABNORMAL LOW (ref 39.0–52.0)
Hemoglobin: 9 g/dL — ABNORMAL LOW (ref 13.0–17.0)
MCH: 30.2 pg (ref 26.0–34.0)
MCHC: 33.6 g/dL (ref 30.0–36.0)
MCV: 89.9 fL (ref 80.0–100.0)
Platelets: 129 K/uL — ABNORMAL LOW (ref 150–400)
RBC: 2.98 MIL/uL — ABNORMAL LOW (ref 4.22–5.81)
RDW: 18.7 % — ABNORMAL HIGH (ref 11.5–15.5)
WBC: 4.7 K/uL (ref 4.0–10.5)
nRBC: 0 % (ref 0.0–0.2)

## 2024-08-12 LAB — FLOW CYTOMETRY REQUEST - FLUID (INPATIENT)

## 2024-08-12 LAB — TYPE AND SCREEN
ABO/RH(D): B POS
Antibody Screen: NEGATIVE
Unit division: 0

## 2024-08-12 MED ORDER — BISACODYL 10 MG RE SUPP
10.0000 mg | Freq: Once | RECTAL | Status: AC
  Administered 2024-08-12: 10 mg via RECTAL
  Filled 2024-08-12: qty 1

## 2024-08-12 MED ORDER — MAGNESIUM SULFATE IN D5W 1-5 GM/100ML-% IV SOLN
1.0000 g | Freq: Once | INTRAVENOUS | Status: AC
  Administered 2024-08-12: 1 g via INTRAVENOUS
  Filled 2024-08-12: qty 100

## 2024-08-12 MED ORDER — POTASSIUM CHLORIDE CRYS ER 20 MEQ PO TBCR
40.0000 meq | EXTENDED_RELEASE_TABLET | ORAL | Status: AC
  Administered 2024-08-12 (×2): 40 meq via ORAL
  Filled 2024-08-12 (×2): qty 2

## 2024-08-12 MED ORDER — DIPHENHYDRAMINE-ZINC ACETATE 2-0.1 % EX CREA
TOPICAL_CREAM | Freq: Three times a day (TID) | CUTANEOUS | Status: DC | PRN
  Filled 2024-08-12: qty 28

## 2024-08-12 MED ORDER — MAGNESIUM CITRATE PO SOLN
1.0000 | Freq: Once | ORAL | Status: AC
  Administered 2024-08-12: 1 via ORAL
  Filled 2024-08-12: qty 296

## 2024-08-12 NOTE — Assessment & Plan Note (Addendum)
 Hx of dysphagia leading to PEG tube placement in 01/2024; tube still present. MBS 11/17 with moderate pharyngoesophageal dysphagia. Patient educated about risks of po, understood risks and wanted to continue to po. - Continue regular diet - Speech following, appreciate assistance in patient's care - RD consulted - Glucerna TID - Consider supplemental EN via PEG if po intake remains poor and pt agrees - Flush/ Irrigate PEG w/ 10 ml Daily

## 2024-08-12 NOTE — Assessment & Plan Note (Signed)
 On chart review, concerns for memory impairment per family in prior notes. Dementia labs so far normal; high B12, normal TSH. Pt was getting B12 injection until 04/2024. CT head without acute abnormalities. Suspect dementia based on CT findings of microvascular disease bridging from lateral horns along lateral ventricle. - No further workup indicated inpatient

## 2024-08-12 NOTE — Progress Notes (Addendum)
 Daily Progress Note Intern Pager: [phone]  Patient name: Kyle Montoya Medical record number: [id number] Date of birth: 15-Nov-1950 Age: 73 y.o. Gender: male  Primary Care Provider: Nestor Elston NOVAK, NP Consultants: Oncology (messaged only), Pulmonology  Code Status: DNR/DNI  Pt Overview and Major Events to Date:  - 11/16: Admitted - 11/17: Thoracentesis, pulm consulted  Medical Decision Making:  Kyle Montoya is a 73 y.o. male with PMH of CHF(?), COPD, EtOH abuse, hx dysphagia s/p PEG (01/2024), history of multiple cancers (lung cancer sp resection, CML, cholangiocarcinoma), HTN, ascending aortic aneurysm, CAD, IDA. Admitted for cough and dyspnea 2/2 COPD exacerbation vs CHF exacerbation.   Also found to have exudative effusion concerning for malignancy. Pulm following, concerned for CML with flow cytology pending. Patient also with LLL lung nodule, pulm planning for follow-up outpatient. Assessment & Plan Acute on chronic diastolic heart failure (HCC) Dyspnea COPD exacerbation (HCC) Dry weight ~190 lb. Today weight 211.64 lbs  from 216 lb yesterday Last 24HR UOP 400 ml Net +848.1 ml - Antibiotics: S/p PO Azithromycin  500 mg daily for 3 days (11/16-11/18) - Breathing tx:  - Home Breztri  (formulary equivalent of Trelegy Ellipta ) 2 puffs twice daily - DuoNebs q6h prn - Complete 5-day course of Prednisone  40 mg daily (11/17-11/20) - S/p IV Solu-Medrol  once in ED 11/16 - Diuresis:  - Continue IV 80 mg BID - S/p IV Lasix  60 mg once 11/16, IV Lasix  40 mg once 11/17, IV Lasix  80 mg once 11/18 - Holding Home torsemide  20 mg daily - Cough: Tessalon  pearls 100 mg TID prn, robitussin 5 mL q4h PRN - Pain: Tylenol  650 mg TID prn - Labs: AM CMP, CBC, Mag, retic count - Repleting electrolytes as needed - Strict I's/O's, daily weights - PT/OT eval Constipation Last BM over 1 week - Dulcolax SR once - Mag Citrate once  - Continue Bowel regimen: Miralax  daily, senna at bedtime  scheduled Pleural effusion Pulmonary nodule Exudative effusion concerning for CML per pulm. - Pulmonology following, appreciate recs - Flow cytology 11/18 pending - Expectorated sputum 11/18 pending - Fluid culture: No growth 2 days - Diurese as above Anemia in chronic illness Hgb of 7.7 today from 7.9 yesterday. Hx of CAD. Ferritin normal (284) in 01/2024. Suspect anemia related to liver disease. Last transfuse 1u pRBCs 11/19 w/ H/H 10.2/30.5 post-transfusion - Daily CBC - Transfusion threshold Hgb < 8 Dysphagia Presence of externally removable percutaneous endoscopic gastrostomy (PEG) tube (HCC) Malnutrition of moderate degree Hx of dysphagia leading to PEG tube placement in 01/2024; tube still present. MBS 11/17 with moderate pharyngoesophageal dysphagia. Patient educated about risks of po, understood risks and wanted to continue to po. - Continue regular diet - Speech following, appreciate assistance in patient's care - RD consulted - Glucerna TID - Consider supplemental EN via PEG if po intake remains poor and pt agrees - Flush/ Irrigate PEG w/ 10 ml Daily  Unhealthy alcohol drinking behavior Hepatic steatosis Regular alcohol use with last drink 11/15. Liver enzymes and bili/direct bili elevated but overall improved. CTAP without liver findings; RUQ US  suggestive of hepatic steatosis. Suspect in setting of alcohol use. - CIWAs ordered without Ativan ; 0s Not currently on CIWAs - Folic acid , thiamine , multivitamin Concern about memory; suspect Dementia On chart review, concerns for memory impairment per family in prior notes. Dementia labs so far normal; high B12, normal TSH. Pt was getting B12 injection until 04/2024. CT head without acute abnormalities. Suspect dementia based on CT findings of microvascular disease  bridging from lateral horns along lateral ventricle. - No further workup indicated inpatient Chronic health problem Hyponatremia: Na of 130, stable. Hypotension:  Consider starting home midodrine  10 mg BID if BP drops with diuresis. BPH: Home Flomax  0.8 mg daily Cancer history: Lung cancer s/p resection, cholangiocarcinoma, CML; was on Dasatinib , though no fills since 07/2023 per med rec.  FEN/GI: Regular PPx: Lovenox  Dispo: Home with home health hopefully tomorrow. Barriers include continued need for IV diuresis, continued monitoring of hemoglobin level.   Subjective:  C/o constipation this morning. State have not having BM for over a week now. Denies SOB on RA  Objective: Temp:  [97.5 F (36.4 C)-97.9 F (36.6 C)] 97.6 F (36.4 C) (11/20 0733) Pulse Rate:  [81-105] 103 (11/20 0733) Resp:  [16-20] 18 (11/20 0733) BP: (122-148)/(67-87) 148/86 (11/20 0733) SpO2:  [97 %-100 %] 100 % (11/20 0733) FiO2 (%):  [21 %] 21 % (11/19 2026) Weight:  [96 kg] 96 kg (11/20 0525) Physical Exam: General: Patient sitting up in bed eating breakfast, no acute distress. Cardiovascular: Regular rate and rhythm, no murmurs/rubs/gallops. Respiratory: Normal work of breathing on room air.  Diffuse wheezes throughout lung fields, expiratory > inspiratory, improved from yesterday. Abdomen: Bowel sounds present and normoactive bilaterally. Soft.  Tenderness around umbilicus, no rebound, no guarding. Somewhat distended. Extremities: Skin warm, dry.  Minimal bilateral pitting lower extremity edema.  Laboratory: Most recent CBC Lab Results  Component Value Date   WBC 4.7 08/12/2024   HGB 9.0 (L) 08/12/2024   HCT 26.8 (L) 08/12/2024   MCV 89.9 08/12/2024   PLT 129 (L) 08/12/2024   Most recent BMP    Latest Ref Rng & Units 08/12/2024    2:46 AM  BMP  Glucose 70 - 99 mg/dL 875   BUN 8 - 23 mg/dL 10   Creatinine 9.38 - 1.24 mg/dL 9.26   Sodium 864 - 854 mmol/L 130   Potassium 3.5 - 5.1 mmol/L 3.3   Chloride 98 - 111 mmol/L 94   CO2 22 - 32 mmol/L 27   Calcium 8.9 - 10.3 mg/dL 7.6   Mag 2.1  Imaging/Diagnostic Tests: CT Head 11/19: No evidence of acute  intracranial abnormality or reversible cause of memory loss.  Suzen Houston NOVAK, DO 08/12/2024, 9:31 AM  PGY-1, [facility] FPTS Intern pager: [phone], text pages welcome Secure chat group [facility] Teaching Service

## 2024-08-12 NOTE — Assessment & Plan Note (Signed)
 Exudative effusion concerning for CML per pulm. - Pulmonology following, appreciate recs - Flow cytology 11/18 pending - Expectorated sputum 11/18 pending - Fluid culture: No growth 2 days - Diurese as above

## 2024-08-12 NOTE — Assessment & Plan Note (Signed)
 Hyponatremia: Na of 130, stable. Hypotension: Consider starting home midodrine  10 mg BID if BP drops with diuresis. BPH: Home Flomax  0.8 mg daily Cancer history: Lung cancer s/p resection, cholangiocarcinoma, CML; was on Dasatinib , though no fills since 07/2023 per med rec.

## 2024-08-12 NOTE — Progress Notes (Signed)
 Speech Language Pathology Treatment: Dysphagia  Patient Details Name: Kyle Montoya MRN: [id number] DOB: 1951/07/27 Today's Date: 08/12/2024 Time: 8542-8487 SLP Time Calculation (min) (ACUTE ONLY): 15 min  Assessment / Plan / Recommendation Clinical Impression  Pt seen for dysphagia therapy introducing pharyngeal exercises to facilitate strength of tongue base, laryngeal mobility and pharyngeal constriction given written instructions with verbal/visual demonstration. Pt has chronic dysphagia with aspiration risk/possible recurrent pneumonia primarily due anatomical abnormalities with fusion of C2-C5 flowing significantly prominent osteophytes. Advised pt to practice exercises once a day and increase frequency if overall status declines with increased weakness/illness etc. He was able to demonstrate exercises with good accuracy exhibiting more difficulty/effort required for one of the more challenging exercises. He referred to written instructions on wall and read swallow strategies and stated he would take them home with him. Continue regular texture, thin liquids using strategies and home exercises. ST will sign off at this time.    HPI HPI: Kyle Montoya is a 73 y.o. male with PMH of CHF(?), COPD, EtOH abuse, hx dysphagia s/p PEG (01/2024), 2022 EGD distal esophagus was moderately tortuous, history of multiple cancers (lung cancer sp resection, CML, cholangiocarcinoma), HTN, ascending aortic aneurysm, IDA presenting with cough and trouble breathing. Diagnosed with COPD exacerbation, infection/aspiration pna. MBS 5/19 with structural abnormalities, osteophytes (fusaion from C2-C5 anteriorly due to flowing osteophytes form cervical spine CT)  with aspiration of thin, coughed but unproductive to eject aspirates. Rec NPO, no improvement with follow up sessions and received PEG with sips water  recommeneded. Pt today reports I never stopped eating and currently PEG is in place. Scheduled for outpatient MBS  but was no show and stated he didn't feel like coming.      SLP Plan  All goals met;Discharge SLP treatment due to (comment)          Recommendations  Diet recommendations: Regular;Thin liquid Liquids provided via: Straw;Cup Medication Administration:  (as tolerated) Supervision: Patient able to self feed Compensations: Slow rate;Small sips/bites;Multiple dry swallows after each bite/sip;Follow solids with liquid;Hard cough after swallow Postural Changes and/or Swallow Maneuvers: Seated upright 90 degrees                  Oral care BID   Intermittent Supervision/Assistance Dysphagia, pharyngoesophageal phase (R13.14)     All goals met;Discharge SLP treatment due to (comment)     Kyle Montoya  08/12/2024, 4:20 PM

## 2024-08-12 NOTE — Assessment & Plan Note (Signed)
 Last BM over 1 week - Dulcolax SR once - Mag Citrate once  - Continue Bowel regimen: Miralax  daily, senna at bedtime scheduled

## 2024-08-12 NOTE — Assessment & Plan Note (Addendum)
 Dry weight ~190 lb. Today weight 211.64 lbs  from 216 lb yesterday Last 24HR UOP 400 ml Net +848.1 ml - Antibiotics: S/p PO Azithromycin  500 mg daily for 3 days (11/16-11/18) - Breathing tx:  - Home Breztri  (formulary equivalent of Trelegy Ellipta ) 2 puffs twice daily - DuoNebs q6h prn - Complete 5-day course of Prednisone  40 mg daily (11/17-11/20) - S/p IV Solu-Medrol  once in ED 11/16 - Diuresis:  - Continue IV 80 mg BID - S/p IV Lasix  60 mg once 11/16, IV Lasix  40 mg once 11/17, IV Lasix  80 mg once 11/18 - Holding Home torsemide  20 mg daily - Cough: Tessalon  pearls 100 mg TID prn, robitussin 5 mL q4h PRN - Pain: Tylenol  650 mg TID prn - Labs: AM CMP, CBC, Mag, retic count - Repleting electrolytes as needed - Strict I's/O's, daily weights - PT/OT eval

## 2024-08-12 NOTE — Assessment & Plan Note (Signed)
 Hgb of 7.7 today from 7.9 yesterday. Hx of CAD. Ferritin normal (284) in 01/2024. Suspect anemia related to liver disease. Last transfuse 1u pRBCs 11/19 w/ H/H 10.2/30.5 post-transfusion - Daily CBC - Transfusion threshold Hgb < 8

## 2024-08-12 NOTE — TOC Progression Note (Signed)
 Transition of Care [facility]) - Progression Note    Patient Details  Name: Zaydenn Balaguer MRN: [id number] Date of Birth: [birth year]  [facility]) CM/SW Contact  Waddell Barnie Rama, RN Phone Number: 08/12/2024, 3:39 PM  Clinical Narrative:    Per PT/OT eval rec HHPT, HHOT, NCM offered choice, he has no preference, sent thru portal, [facility] has accepted.  Soc will begin 24 to 48 hrs post dc.     Expected Discharge Plan: Home/Self Care Barriers to Discharge: Continued Medical Work up               Expected Discharge Plan and Services In-house Referral: Clinical Social Work Discharge Planning Services: CM Consult Post Acute Care Choice: NA Living arrangements for the past 2 months: Single Family Home                                       Social Drivers of Health (SDOH) Interventions SDOH Screenings   Food Insecurity: No Food Insecurity (08/08/2024)  Housing: Low Risk  (08/08/2024)  Transportation Needs: No Transportation Needs (08/08/2024)  Utilities: Not At Risk (08/08/2024)  Social Connections: Socially Isolated (08/08/2024)  Tobacco Use: Medium Risk (08/08/2024)    Readmission Risk Interventions    12/08/2023   10:22 AM 12/03/2023   10:44 AM 10/09/2023    3:26 PM  Readmission Risk Prevention Plan  Transportation Screening  Complete Complete  PCP or Specialist Appt within 5-7 Days   Complete  PCP or Specialist Appt within 3-5 Days Complete    Home Care Screening   Complete  Medication Review (RN CM)   Complete  HRI or Home Care Consult Complete Complete   Social Work Consult for Recovery Care Planning/Counseling  Complete   Palliative Care Screening Complete Not Applicable   Medication Review Oceanographer) Complete Complete

## 2024-08-12 NOTE — Assessment & Plan Note (Signed)
 Regular alcohol use with last drink 11/15. Liver enzymes and bili/direct bili elevated but overall improved. CTAP without liver findings; RUQ US  suggestive of hepatic steatosis. Suspect in setting of alcohol use. - CIWAs ordered without Ativan ; 0s Not currently on CIWAs - Folic acid , thiamine , multivitamin

## 2024-08-13 ENCOUNTER — Other Ambulatory Visit (HOSPITAL_COMMUNITY): Payer: Self-pay

## 2024-08-13 ENCOUNTER — Telehealth (HOSPITAL_COMMUNITY): Payer: Self-pay

## 2024-08-13 DIAGNOSIS — I5033 Acute on chronic diastolic (congestive) heart failure: Secondary | ICD-10-CM | POA: Diagnosis not present

## 2024-08-13 LAB — COMPREHENSIVE METABOLIC PANEL WITH GFR
ALT: 207 U/L — ABNORMAL HIGH (ref 0–44)
AST: 124 U/L — ABNORMAL HIGH (ref 15–41)
Albumin: 2.3 g/dL — ABNORMAL LOW (ref 3.5–5.0)
Alkaline Phosphatase: 254 U/L — ABNORMAL HIGH (ref 38–126)
Anion gap: 10 (ref 5–15)
BUN: 7 mg/dL — ABNORMAL LOW (ref 8–23)
CO2: 28 mmol/L (ref 22–32)
Calcium: 7.7 mg/dL — ABNORMAL LOW (ref 8.9–10.3)
Chloride: 95 mmol/L — ABNORMAL LOW (ref 98–111)
Creatinine, Ser: 0.75 mg/dL (ref 0.61–1.24)
GFR, Estimated: >60 mL/min (ref >60)
Glucose, Bld: 148 mg/dL — ABNORMAL HIGH (ref 70–99)
Potassium: 3.8 mmol/L (ref 3.5–5.1)
Sodium: 133 mmol/L — ABNORMAL LOW (ref 135–145)
Total Bilirubin: 1.4 mg/dL — ABNORMAL HIGH (ref 0.0–1.2)
Total Protein: 5.6 g/dL — ABNORMAL LOW (ref 6.5–8.1)

## 2024-08-13 LAB — CBC
HCT: 26.5 % — ABNORMAL LOW (ref 39.0–52.0)
Hemoglobin: 9 g/dL — ABNORMAL LOW (ref 13.0–17.0)
MCH: 30.3 pg (ref 26.0–34.0)
MCHC: 34 g/dL (ref 30.0–36.0)
MCV: 89.2 fL (ref 80.0–100.0)
Platelets: 143 K/uL — ABNORMAL LOW (ref 150–400)
RBC: 2.97 MIL/uL — ABNORMAL LOW (ref 4.22–5.81)
RDW: 18.7 % — ABNORMAL HIGH (ref 11.5–15.5)
WBC: 4.8 K/uL (ref 4.0–10.5)
nRBC: 0 % (ref 0.0–0.2)

## 2024-08-13 LAB — SURGICAL PATHOLOGY

## 2024-08-13 LAB — MAGNESIUM: Magnesium: 2 mg/dL (ref 1.7–2.4)

## 2024-08-13 LAB — CYTOLOGY - NON PAP

## 2024-08-13 MED ORDER — HYDROCODONE-ACETAMINOPHEN 5-325 MG PO TABS
1.0000 | ORAL_TABLET | Freq: Four times a day (QID) | ORAL | Status: DC | PRN
Start: 2024-08-13 — End: 2024-08-13
  Administered 2024-08-13: 1 via ORAL
  Filled 2024-08-13: qty 1

## 2024-08-13 MED ORDER — GLUCERNA SHAKE PO LIQD: 237.0000 mL | Freq: Three times a day (TID) | ORAL | Status: AC

## 2024-08-13 MED ORDER — SENNOSIDES-DOCUSATE SODIUM 8.6-50 MG PO TABS: 1.0000 | ORAL_TABLET | Freq: Every day | ORAL | Status: AC

## 2024-08-13 MED ORDER — POLYETHYLENE GLYCOL 3350 17 G PO PACK: 17.0000 g | PACK | Freq: Every day | ORAL | Status: AC

## 2024-08-13 MED ORDER — TORSEMIDE 20 MG PO TABS
20.0000 mg | ORAL_TABLET | Freq: Every day | ORAL | 0 refills | Status: AC
Start: 2024-08-13 — End: ?
  Filled 2024-08-13: qty 30, 30d supply, fill #0

## 2024-08-13 MED ORDER — POTASSIUM CHLORIDE CRYS ER 20 MEQ PO TBCR
20.0000 meq | EXTENDED_RELEASE_TABLET | Freq: Once | ORAL | Status: AC
  Administered 2024-08-13: 20 meq via ORAL
  Filled 2024-08-13: qty 1

## 2024-08-13 MED ORDER — TAMSULOSIN HCL 0.4 MG PO CAPS
0.8000 mg | ORAL_CAPSULE | Freq: Every day | ORAL | 0 refills | Status: DC
  Filled 2024-08-13: qty 30, 15d supply, fill #0

## 2024-08-13 MED ORDER — FOLIC ACID 1 MG PO TABS
1.0000 mg | ORAL_TABLET | Freq: Every day | ORAL | 0 refills | Status: AC
  Filled 2024-08-13: qty 30, 30d supply, fill #0

## 2024-08-13 MED ORDER — POTASSIUM CHLORIDE CRYS ER 20 MEQ PO TBCR
20.0000 meq | EXTENDED_RELEASE_TABLET | Freq: Two times a day (BID) | ORAL | 0 refills | Status: AC
  Filled 2024-08-13: qty 60, 30d supply, fill #0

## 2024-08-13 MED ORDER — TAMSULOSIN HCL 0.4 MG PO CAPS
0.8000 mg | ORAL_CAPSULE | Freq: Every day | ORAL | 0 refills | Status: AC
  Filled 2024-08-13: qty 60, 30d supply, fill #0

## 2024-08-13 MED ORDER — FLUTICASONE-UMECLIDIN-VILANT 100-62.5-25 MCG/ACT IN AEPB
1.0000 | INHALATION_SPRAY | Freq: Every day | RESPIRATORY_TRACT | 0 refills | Status: AC | PRN
  Filled 2024-08-13: qty 60, 30d supply, fill #0

## 2024-08-13 MED ORDER — THIAMINE HCL 100 MG PO TABS
100.0000 mg | ORAL_TABLET | Freq: Every day | ORAL | 0 refills | Status: AC
Start: 2024-08-14 — End: ?
  Filled 2024-08-13: qty 30, 30d supply, fill #0

## 2024-08-13 NOTE — TOC Transition Note (Signed)
 Transition of Care [facility]) - Discharge Note   Patient Details  Name: Kyle Montoya MRN: [id number] Date of Birth: 1951-01-16  [facility]) CM/SW Contact:  Waddell Barnie Rama, RN Phone Number: 08/13/2024, 11:55 AM   Clinical Narrative:    For dc today, he is set up with HH.  Has tranport.     Barriers to Discharge: Continued Medical Work up   Patient Goals and CMS Choice Patient states their goals for this hospitalization and ongoing recovery are:: To go home   Choice offered to / list presented to : NA      Discharge Placement                       Discharge Plan and Services Additional resources added to the After Visit Summary for   In-house Referral: Clinical Social Work Discharge Planning Services: CM Consult Post Acute Care Choice: NA                               Social Drivers of Health (SDOH) Interventions SDOH Screenings   Food Insecurity: No Food Insecurity (08/08/2024)  Housing: Low Risk  (08/08/2024)  Transportation Needs: No Transportation Needs (08/08/2024)  Utilities: Not At Risk (08/08/2024)  Social Connections: Socially Isolated (08/08/2024)  Tobacco Use: Medium Risk (08/08/2024)     Readmission Risk Interventions    12/08/2023   10:22 AM 12/03/2023   10:44 AM 10/09/2023    3:26 PM  Readmission Risk Prevention Plan  Transportation Screening  Complete Complete  PCP or Specialist Appt within 5-7 Days   Complete  PCP or Specialist Appt within 3-5 Days Complete    Home Care Screening   Complete  Medication Review (RN CM)   Complete  HRI or Home Care Consult Complete Complete   Social Work Consult for Recovery Care Planning/Counseling  Complete   Palliative Care Screening Complete Not Applicable   Medication Review Oceanographer) Complete Complete

## 2024-08-13 NOTE — Discharge Summary (Addendum)
 Family Medicine Teaching [facility] Discharge Summary  Patient name: Kyle Montoya Medical record number: [id number] Date of birth: [birth year] Age: 73 y.o. Gender: male Date of Admission: 08/08/2024  Date of Discharge: 08/13/24 Admitting Physician: Kyle Flies, MD  Primary Care Provider: Nestor Elston NOVAK, NP Consultants: Pulmonology; oncology (via SecureChat only)  Indication for Hospitalization: cough and dyspnea  Discharge Diagnoses/Problem List:  Principal Problem for Admission: COPD exacerbation, CHF exacerbation Other Problems addressed during stay:  Principal Problem:   Acute on chronic diastolic heart failure (HCC) Active Problems:   Constipation   COPD exacerbation (HCC)   Dyspnea   Dysphagia   Transaminasemia   Presence of externally removable percutaneous endoscopic gastrostomy (PEG) tube (HCC)   Unhealthy alcohol drinking behavior   Elevated liver enzymes   Elevated bilirubin   Hyperbilirubinemia   Pulmonary nodule   Right ventricular dilation   Hepatic steatosis   Anemia in chronic illness   Aortic regurgitation, moderate   Malnutrition of moderate degree   Concern about memory; suspect Dementia   Hyponatremia   Pleural effusion   Brief Hospital Course:  Kyle Montoya is a 73 y.o. year old with a history of CHF(?), COPD, EtOH abuse, hx dysphagia s/p PEG (01/2024), history of multiple cancers (lung cancer sp resection, CML, cholangiocarcinoma), HTN, ascending aortic aneurysm, IDA who presented with cough and dyspnea and was admitted to the [facility] Medicine Teaching Service for COPD exacerbation.  Dyspnea, cough COPD exacerbation Acute decompensated heart failure Patient presented with 2 weeks of coughing and worsening dyspnea in addition to bilateral leg and abdomen edema.  Worked up for aspiration pneumonia given history of dysphagia with abnormal barium swallow in 01/2024; also worked up for PE in setting of robust history of malignancy with uneven  lower extremity edema and Wells score of 4.  CT PE done 11/16 showed no PE; LLL pulmonary nodule. CTAP obtained given abdominal edema in setting of history of biliary cancer; done 11/16 without significant findings.  Questionable history of CHF with possible grade 1 diastolic dysfunction on echo done 03/2024; repeat echo this admission done 11/17 showed LVEF 55-60%, mild LVH, normal diastolic parameters, severely enlarged RV, RA pressure 15 mmHg.  Treated with IV diuresis, later transitioned to p.o at discharge.  Also treated for COPD exacerbation given presentation with diffuse expiratory wheezes and initial O2 requirement: Given p.o. azithromycin  500 mg daily for 3-day course (11/16-11/18); s/p IV Solu-Medrol  in ED on 11/16, then transitioned to p.o. prednisone  40 mg to complete 5-day course (11/17-11/20); breathing treatments with DuoNebs (weaned over course of hospitalization) and twice daily Breztri , later switched to Yupelri  + Brovana  + Pulmicort  nebs (formula equivalent of home Trelegy Ellipta ).  Breathing and cough improved throughout admission.  Pleural effusion Pulmonary nodule CXR with b/l pleural effusions, R>L. Has a history of pleural effusions. Questionably due to prior chemo drug Dasatinib ; however, per medication dispense history, this has not been filled since 07/2023.  CT PE showed enlarging right pleural effusion and small left pleural effusion.  Given this, along with CT PE done 11/16 showing LLL pulmonary nodule, patient underwent thoracentesis on 11/17, which revealed exudative fluid concerning for malignancy.  Pulmonology was consulted, who recommended fluid cytology to evaluate for CML, pending at time of discharge.  Plan per pulm to undergo biopsy of lung nodule outpatient.  Dysphagia Hx of dysphagia leading to PEG tube placement in 01/2024. Tube still presenting without signs of infection on admission. Per patient, he has been po-ing without issue recently.  SLP consult  placed,  and recommended MBS.  This was done 11/17 and showed chronic dysphagia due to significantly large fused osteophytes leading to trace aspiration and significant residue in valleculae and pyriform sinuses.  Patient was adamant about continuing to eat as he had been doing, and so no changes to diet were made.  PEG tube was kept in place given potential need for future use. RD was consulted, and recommended Glucerna supplementation 3 times daily.  Memory concern, concern for dementia Concerns for memory impairment per family seen in prior notes.  Dementia workup pursued inpatient given patient's poor compliance with outpatient follow-up.  TSH was normal.  Vitamin B12 was elevated, potentially in setting of recent supplementation versus malignancy.  CT head done 08/11/24 showed no acute abnormalities: did show microvascular disease bridging from lateral horns along lateral ventricle, concerning for dementia.  Bilateral lower extremity edema Admission exam with R>L lower extremity edema.  Given robust cancer history, bilateral DVT US  was done 11/18 and showed no evidence of DVT bilaterally.   Alcohol use Elevated Liver Enzymes Patient with regular and robust alcohol use; no withdrawal/seizure history.  CIWA was monitored throughout admission and remained unremarkable. Did have elevated liver enzymes and bili, which improved (but did not normalize) throughout admission. CTAP without significant hepatic findings; RUQ US  suggestive of hepatic steatosis. Suspect in setting of alcohol use.  Other chronic conditions were medically managed with home medications and formulary alternatives as necessary (hyponatremia, hypotension, BPH, anemia).  PCP Follow-up Recommendations: Ensure follow-up with outpatient pulmonology for lung biopsy Ensure follow-up with outpatient oncology; they can consider switch from Dasatinib  to other chemotherapy regimen (Dasatinib  held at discharge) Continue to monitor ascending aortic  aneurysm annually. Follow-up in 1-year with adrenal washout CT or chemical-shift MR to evaluate adrenal nodule. Ensure follow-up with neurology Screen for depression; consider referral to therapy/medications Follow-up likely dementia diagnosis Encourage alcohol abstinence     Results/Tests Pending at Time of Discharge:  Unresulted Labs (From admission, onward)     Start     Ordered   08/10/24 0738  Expectorated Sputum Assessment w Gram Stain, Rflx to Resp Cult  Once,   R        08/10/24 0737   08/09/24 1121  Miscellaneous [facility] test (send-out)  Once,   R        08/09/24 1121          Body Fluid Culture: No growth 3 days (at time of discharge)   Disposition: Home with HHPT/OT/RN  Discharge Condition: Stable  Discharge Exam:  Vitals:   08/13/24 0859 08/13/24 1130  BP:  125/76  Pulse: 77 75  Resp: 18 19  Temp:  97.9 F (36.6 C)  SpO2: 96% 96%   See earlier Progress Note by Kyle Montoya for physical exam  Significant Procedures:  - 11/17: MBS showing moderate pharyngoesophageal dysphagia - 11/17: Thoracentesis - 11/19: Transfused 1u pRBCs  Significant Labs and Imaging:  Recent Labs  Lab 08/11/24 1837 08/12/24 0246 08/13/24 0228  WBC  --  4.7 4.8  HGB 10.2* 9.0* 9.0*  HCT 30.5* 26.8* 26.5*  PLT  --  129* 143*  Retic Ct: 2.4  Recent Labs  Lab 08/12/24 0246 08/13/24 0228  NA 130* 133*  K 3.3* 3.8  CL 94* 95*  CO2 27 28  GLUCOSE 124* 148*  BUN 10 7*  CREATININE 0.73 0.75  CALCIUM 7.6* 7.7*  MG 1.7 2.0  ALKPHOS 264* 254*  AST 162* 124*  ALT 239* 207*  ALBUMIN  2.3* 2.3*  Respiratory Panel (4): Negative   CXR 11/16: 1. New bilateral pleural effusions, moderate on the right and small on the left. 2. Bibasilar, hazy lung opacities that may represent atelectasis or airspace disease. No frank interstitial edema.  CTPE 11/16: 1. No pulmonary embolus. 2. Enlarging right pleural effusion and new small left pleural effusion. 3. Left lower lobe  1.2 x 1.3 cm solid pulmonary nodule; recommend further evaluation with non-contrast chest CT at 3 months, PET/CT, or tissue sampling per Fleischner Society Guidelines. 4. Ascending thoracic aortic aneurysm measuring 4.1 cm; recommend annual angiography imaging surveillance.  CTAP 11/16: 1. Gastrostomy tube in appropriate position. 2. Stable postoperative changes from cholecystectomy and Whipple procedure. 3. Trace ascites. Stranding along the mesenteric perirectal fat without bowel wall thickening or dilatation. 4. Fatty infiltration of the colonic wall, likely related to chronic inflammation or constipation. 5. Stable 1.2 cm left adrenal nodule; recommend 1-year follow-up adrenal washout CT or chemical-shift MR to confirm benignity given size and attenuation.  RUQ US  11/18: 1. Evidence of hepatic steatosis. 2. No evidence of portal vein thrombus or significant underlying portal hypertension. 3. Ascites visualized around the spleen and liver.  CT Head 11/19: No evidence of acute intracranial abnormality or reversible cause of memory loss.  CXR 11/20: No significant change in bilateral pleural effusions, right greater than left, with overlying bibasilar atelectasis.   Discharge Medications:  Allergies as of 08/13/2024       Reactions   Pneumococcal Vaccines Swelling        Medication List     PAUSE taking these medications    dasatinib  100 MG tablet Wait to take this until your doctor or other care provider tells you to start again. Commonly known as: Sprycel  Place 1 tablet (100 mg total) into feeding tube daily. What changed: how to take this   midodrine  5 MG tablet Wait to take this until your doctor or other care provider tells you to start again. Commonly known as: PROAMATINE  Place 2 tablets (10 mg total) into feeding tube 2 (two) times daily with a meal. What changed: how to take this       STOP taking these medications    methocarbamol  750 MG tablet Commonly  known as: ROBAXIN        TAKE these medications    albuterol  (2.5 MG/3ML) 0.083% nebulizer solution Commonly known as: PROVENTIL  Take 2.5 mg by nebulization every 4 (four) hours as needed for wheezing or shortness of breath.   albuterol  108 (90 Base) MCG/ACT inhaler Commonly known as: VENTOLIN  HFA Inhale 2 puffs into the lungs every 6 (six) hours as needed for wheezing or shortness of breath.   allopurinol  300 MG tablet Commonly known as: ZYLOPRIM  Place 1 tablet (300 mg total) into feeding tube daily. What changed: how to take this   ascorbic acid 500 MG tablet Commonly known as: VITAMIN C Take 500 mg by mouth daily.   aspirin  EC 81 MG tablet Take 1 tablet (81 mg total) by mouth daily. Swallow whole.   Calcium 600+D3 600-5 MG-MCG Tabs Generic drug: Calcium Carb-Cholecalciferol Take 2 tablets by mouth daily.   famotidine  40 MG tablet Commonly known as: PEPCID  Place 1 tablet (40 mg total) into feeding tube daily. What changed: how to take this   feeding supplement (GLUCERNA SHAKE) Liqd Take 237 mLs by mouth 3 (three) times daily between meals.   Ferrous Sulfate 28 MG Tabs Take 1 tablet by mouth daily.   Fluticasone -Umeclidin-Vilant 100-62.5-25 MCG/ACT Aepb Inhale 1 puff into the  lungs daily as needed (Asthma). What changed: medication strength   folic acid  1 MG tablet Commonly known as: FOLVITE  Take 1 tablet (1 mg total) by mouth daily.   multivitamin with minerals Tabs tablet Place 1 tablet into feeding tube daily. What changed: how to take this   polyethylene glycol 17 g packet Commonly known as: MIRALAX  / GLYCOLAX  Take 17 g by mouth daily. Start taking on: August 14, 2024   potassium chloride  SA 20 MEQ tablet Commonly known as: KLOR-CON  M Take 1 tablet (20 mEq total) by mouth 2 (two) times daily.   senna-docusate 8.6-50 MG tablet Commonly known as: Senokot-S Take 1 tablet by mouth at bedtime.   tamsulosin  0.4 MG Caps capsule Commonly known as:  FLOMAX  Take 2 capsules (0.8 mg total) by mouth daily.   thiamine  100 MG tablet Commonly known as: VITAMIN B1 Take 1 tablet (100 mg total) by mouth daily. Start taking on: August 14, 2024   torsemide  20 MG tablet Commonly known as: DEMADEX  Take 1 tablet (20 mg total) by mouth daily.   Vitamin D  50 MCG (2000 UT) tablet Place 1 tablet (2,000 Units total) into feeding tube daily. What changed: how to take this        Discharge Instructions: Please refer to Patient Instructions section of EMR for full details.  Patient was counseled important signs and symptoms that should prompt return to medical care, changes in medications, dietary instructions, activity restrictions, and follow up appointments.   Follow-Up Appointments:  Contact information for follow-up providers     Tetter, Elston B, NP Follow up on 08/24/2024.   Specialty: Nurse Practitioner Why: 12:00 for hospital follow up Contact information: [address] [place] [address] [phone]              Contact information for after-discharge care     Home Medical Care     [facility]) .   Service: Home Health Services Why: Agency will call you to set up apt times Contact information: [address] [place]  [address] [phone]                     Larraine Palma, MD 08/13/2024, 12:21 PM PGY-1, [facility] Medicine  I have verified that the service and findings are accurately documented in the resident's note above.  Damien Cassis, MD                  08/13/2024, 2:07 PM

## 2024-08-13 NOTE — Assessment & Plan Note (Signed)
 Elevated liver enzymes, alk phos, bili; improving. CTAP without liver findings; RUQ US  suggestive of hepatic steatosis. Suspect in setting of alcohol use. - Folic acid , thiamine , multivitamin

## 2024-08-13 NOTE — Care Management Important Message (Signed)
 Important Message  Patient Details  Name: Ry Moody MRN: [id number] Date of Birth: [birth year]   Important Message Given:  Yes - Medicare IM     Vonzell Arrie Sharps 08/13/2024, 2:55 PM

## 2024-08-13 NOTE — Assessment & Plan Note (Addendum)
 Had BM x2 11/20 s/p Dulcolax and Mag Citrate. - Bowel regimen: Miralax  daily, senna at bedtime scheduled

## 2024-08-13 NOTE — Assessment & Plan Note (Addendum)
 Exudative effusion concerning for lung malignancy. - Pulmonology following, plan for outpatient biopsy - Flow cytology 11/18 pending - Expectorated sputum 11/18 pending - Fluid culture: No growth 3 days - Diurese as above

## 2024-08-13 NOTE — Assessment & Plan Note (Signed)
 On chart review, concerns for memory impairment per family in prior notes. Dementia labs so far normal; high B12, normal TSH. Pt was getting B12 injection until 04/2024. CT head without acute abnormalities. Suspect dementia based on CT findings of microvascular disease bridging from lateral horns along lateral ventricle. - No further workup indicated inpatient

## 2024-08-13 NOTE — Assessment & Plan Note (Signed)
 Hx of dysphagia leading to PEG tube placement in 01/2024; tube still present. MBS 11/17 with moderate pharyngoesophageal dysphagia. Patient educated by SLP about risks of po, understood risks and wanted to continue to po. - Speech provided swallowing exercises; has now signed off - Continue regular diet - RD consulted: - Glucerna TID - Consider supplemental EN via PEG if po intake remains poor and pt agrees - Flush/Irrigate PEG w/ 10 ml Daily

## 2024-08-13 NOTE — Telephone Encounter (Signed)
 Pharmacy Patient Advocate Encounter  Insurance verification completed.    The patient is insured through [facility]. Patient has Medicare and is not eligible for a copay card, but may be able to apply for patient assistance or Medicare RX Payment Plan (Patient Must reach out to their plan, if eligible for payment plan), if available.    Ran test claim for Trelegy 100-62.5-25mcg and the current 30 day co-pay is $302-due to deductible.  Ran test claim for Breztri  [id number] and the current 30 day co-pay is $47   This test claim was processed through [facility]- copay amounts may vary at other pharmacies due to [employer], or as the patient moves through the different stages of their insurance plan.

## 2024-08-13 NOTE — Progress Notes (Signed)
 Occupational Therapy Treatment Patient Details Name: Kyle Montoya MRN: [id number] DOB: 1951-07-17 Today's Date: 11/21/20252   History of present illness Pt is a 73 y.o. M presenting to [facility] on 08/08/24 with c/o SOB x3 days, BLE edema, and requesting removal of feeding tube. Thoracentesis done on 11/17 yielding 800 mL, and preliminary BLE US  w/out evidence of DVT bilaterally. PMH is significant for CHF, AKI, alcohol abuse, CAD, DJD, HTN, PE, PNA, renal insufficiency, thoracic aortic aneurysm and T2DM.   OT comments  Pt progressing toward goals. Able to mobilize @ RW level or furniture walking with S and requires min A with LB bathing/dressing. SpO2 93; HR 80 on RA with activity. Pt states he feels much better, is getting closer to baseline and states he is ready to go home. Continue to recommend follow up with HHOT.       If plan is discharge home, recommend the following:  A little help with walking and/or transfers;A little help with bathing/dressing/bathroom;Assistance with cooking/housework;Direct supervision/assist for financial management;Direct supervision/assist for medications management;Assist for transportation;Help with stairs or ramp for entrance   Equipment Recommendations  None recommended by OT    Recommendations for Other Services PT consult    Precautions / Restrictions Precautions Precautions: Fall Restrictions Weight Bearing Restrictions Per Provider Order: No       Mobility Bed Mobility Overal bed mobility: Modified Independent                  Transfers Overall transfer level: Needs assistance Equipment used: None Transfers: Sit to/from Stand Sit to Stand: Supervision                 Balance Overall balance assessment: Needs assistance Sitting-balance support: Feet supported, No upper extremity supported Sitting balance-Leahy Scale: Good Sitting balance - Comments: pt able to sit edge of recliner w/out any losses of balance   Standing  balance support: During functional activity, No upper extremity supported Standing balance-Leahy Scale: Fair Standing balance comment: furniture walking at times                           ADL either performed or assessed with clinical judgement   ADL Overall ADL's : Needs assistance/impaired Eating/Feeding: Set up   Grooming: Set up   Upper Body Bathing: Sitting;Set up   Lower Body Bathing: Minimal assistance;Sit to/from stand   Upper Body Dressing : Sitting;Set up   Lower Body Dressing: Minimal assistance;Sit to/from stand   Toilet Transfer: Ambulation;Rolling walker (2 wheels);Contact guard assist   Toileting- Clothing Manipulation and Hygiene: Supervision/safety       Functional mobility during ADLs: Rolling walker (2 wheels);Supervision/safety      Extremity/Trunk Assessment Upper Extremity Assessment Upper Extremity Assessment: Generalized weakness   Lower Extremity Assessment Lower Extremity Assessment: Defer to PT evaluation        Vision   Vision Assessment?: No apparent visual deficits   Perception Perception Perception: Not tested   Praxis Praxis Praxis: Not tested   Communication Communication Communication: No apparent difficulties Factors Affecting Communication: Reduced clarity of speech   Cognition Arousal: Alert Behavior During Therapy: WFL for tasks assessed/performed Cognition: No apparent impairments                               Following commands: Intact        Cueing   Cueing Techniques: Verbal cues  Exercises  Shoulder Instructions       General Comments  Furniture walked in the room to gather items without LOB    Pertinent Vitals/ Pain       Pain Assessment Pain Assessment: No/denies pain  Home Living                                          Prior Functioning/Environment              Frequency  Min 2X/week        Progress Toward Goals  OT Goals(current  goals can now be found in the care plan section)  Progress towards OT goals: Progressing toward goals  Acute Rehab OT Goals Patient Stated Goal: home today OT Goal Formulation: With patient Time For Goal Achievement: 08/25/24 Potential to Achieve Goals: Good ADL Goals Pt Will Perform Upper Body Dressing: with modified independence;sitting Pt Will Perform Lower Body Dressing: with contact guard assist;sitting/lateral leans;sit to/from stand Pt Will Transfer to Toilet: with contact guard assist;ambulating;regular height toilet Pt Will Perform Tub/Shower Transfer: Tub transfer;Shower transfer;with contact guard assist;ambulating;rolling walker Additional ADL Goal #1: pt will verbalize x3 energy conservation strategies in prep for ADLs  Plan      Co-evaluation                 AM-PAC OT 6 Clicks Daily Activity     Outcome Measure   Help from another person eating meals?: None Help from another person taking care of personal grooming?: A Little Help from another person toileting, which includes using toliet, bedpan, or urinal?: A Little Help from another person bathing (including washing, rinsing, drying)?: A Little Help from another person to put on and taking off regular upper body clothing?: A Little Help from another person to put on and taking off regular lower body clothing?: A Little 6 Click Score: 19    End of Session Equipment Utilized During Treatment: Rolling walker (2 wheels)  OT Visit Diagnosis: Unsteadiness on feet (R26.81);Other abnormalities of gait and mobility (R26.89);Muscle weakness (generalized) (M62.81)   Activity Tolerance Patient tolerated treatment well   Patient Left in bed;with call bell/phone within reach;with bed alarm set (seated EOB)   Nurse Communication Mobility status        Time: 9048-8983 OT Time Calculation (min): 25 min  Charges: OT General Charges $OT Visit: 1 Visit OT Treatments $Self Care/Home Management : 23-37  mins  Kreg Sink, OT/L   Acute OT Clinical Specialist Acute Rehabilitation Services Pager [phone] Office [phone]   [facility] 08/13/2024, 10:23 AM

## 2024-08-13 NOTE — Assessment & Plan Note (Addendum)
 Dry weight ~190 lb. Today's weight is 206 lbs. Output of 1.35L yesterday. - Antibiotics: S/p PO Azithromycin  500 mg daily for 3 days (11/16-11/18) - Breathing tx:  - Yupelri  nebs 125 mcg daily, Brovana  nebs 15 mcg BID, Pulmicort  0.5 mg nebs BID (equivalent of Breztri ) - DuoNebs q6h prn -> space to BID - Incentive spirometer - S/p 5-day course of steroids: IV Solu-Medrol  once in ED 11/16, Prednisone  40 mg daily (11/17-11/20) - Diuresis: IV Lasix  80 mg BID - S/p IV Lasix  60 mg once 11/16, IV Lasix  40 mg once 11/17, IV Lasix  80 mg once 11/18 - Holding Home torsemide  20 mg daily - Cough: Tessalon  pearls 100 mg TID prn, robitussin 5 mL TID scheduled - Pain: Tylenol  650 mg TID prn, hydrocodone -acetaminophen  5-325 mg q6h prn - Labs: Holiday - Repleting electrolytes as needed, goal K>4, Mag>2; today K repleted with 20 mEq once - Strict I's/O's, daily weights - PT/OT eval

## 2024-08-13 NOTE — Progress Notes (Signed)
 Daily Progress Note Intern Pager: [phone]  Patient name: Kyle Montoya Medical record number: [id number] Date of birth: 01/07/1951 Age: 73 y.o. Gender: male  Primary Care Provider: Nestor Elston NOVAK, NP Consultants: Oncology (messaged only), Pulmonology  Code Status: DNR/DNI  Pt Overview and Major Events to Date:  - 11/16: Admitted - 11/17: Thoracentesis, pulm consulted - 11/19: Transfused 1u pRBCs  Medical Decision Making:  Kyle Montoya is a 73 y.o. male with PMH of CHF(?), COPD, EtOH abuse, hx dysphagia s/p PEG (01/2024), history of multiple cancers (lung cancer sp resection, CML, cholangiocarcinoma), HTN, ascending aortic aneurysm, CAD, IDA. Admitted for cough and dyspnea 2/2 COPD exacerbation vs CHF exacerbation.  Found to have exudative effusion post-thoracentesis concerning for lung malignancy in setting of LLL lung nodule. Pulm following; planning for outpatient biopsy. Assessment & Plan Acute on chronic diastolic heart failure (HCC) Dyspnea COPD exacerbation (HCC) Dry weight ~190 lb. Today's weight is 206 lbs. Output of 1.35L yesterday. - Antibiotics: S/p PO Azithromycin  500 mg daily for 3 days (11/16-11/18) - Breathing tx:  - Yupelri  nebs 125 mcg daily, Brovana  nebs 15 mcg BID, Pulmicort  0.5 mg nebs BID (equivalent of Breztri ) - DuoNebs q6h prn -> space to BID - Incentive spirometer - S/p 5-day course of steroids: IV Solu-Medrol  once in ED 11/16, Prednisone  40 mg daily (11/17-11/20) - Diuresis: IV Lasix  80 mg BID - S/p IV Lasix  60 mg once 11/16, IV Lasix  40 mg once 11/17, IV Lasix  80 mg once 11/18 - Holding Home torsemide  20 mg daily - Cough: Tessalon  pearls 100 mg TID prn, robitussin 5 mL TID scheduled - Pain: Tylenol  650 mg TID prn, hydrocodone -acetaminophen  5-325 mg q6h prn - Labs: Holiday - Repleting electrolytes as needed, goal K>4, Mag>2; today K repleted with 20 mEq once - Strict I's/O's, daily weights - PT/OT eval Constipation Had BM x2 11/20 s/p  Dulcolax and Mag Citrate. - Bowel regimen: Miralax  daily, senna at bedtime scheduled Pleural effusion Pulmonary nodule Exudative effusion concerning for lung malignancy. - Pulmonology following, plan for outpatient biopsy - Flow cytology 11/18 pending - Expectorated sputum 11/18 pending - Fluid culture: No growth 3 days - Diurese as above Anemia in chronic illness Hgb stable at 9. Ferritin normal (284) in 01/2024. Suspect anemia related to liver disease. - Daily CBC - Transfusion threshold Hgb < 8 Dysphagia Presence of externally removable percutaneous endoscopic gastrostomy (PEG) tube (HCC) Malnutrition of moderate degree Hx of dysphagia leading to PEG tube placement in 01/2024; tube still present. MBS 11/17 with moderate pharyngoesophageal dysphagia. Patient educated by SLP about risks of po, understood risks and wanted to continue to po. - Speech provided swallowing exercises; has now signed off - Continue regular diet - RD consulted: - Glucerna TID - Consider supplemental EN via PEG if po intake remains poor and pt agrees - Flush/Irrigate PEG w/ 10 ml Daily  Unhealthy alcohol drinking behavior Hepatic steatosis Elevated liver enzymes, alk phos, bili; improving. CTAP without liver findings; RUQ US  suggestive of hepatic steatosis. Suspect in setting of alcohol use. - Folic acid , thiamine , multivitamin Concern about memory; suspect Dementia On chart review, concerns for memory impairment per family in prior notes. Dementia labs so far normal; high B12, normal TSH. Pt was getting B12 injection until 04/2024. CT head without acute abnormalities. Suspect dementia based on CT findings of microvascular disease bridging from lateral horns along lateral ventricle. - No further workup indicated inpatient Chronic health problem Rash: Benadryl  cream TID prn Hyponatremia: Na of 133, stable. Hypotension:  Consider starting home midodrine  10 mg BID if BP drops with diuresis. BPH: Home Flomax  0.8  mg daily Cancer history: Lung cancer s/p resection, cholangiocarcinoma, CML; was on Dasatinib , though patient has not taken this since summer 2025 per his report   FEN/GI: Regular PPx: Lovenox  Dispo: Home with home health today  Subjective:  He reports he is feeling good overall and is ready to go home.  No concerns  Objective: Temp:  [97.5 F (36.4 C)-98.3 F (36.8 C)] 97.7 F (36.5 C) (11/21 0305) Pulse Rate:  [74-90] 77 (11/21 0859) Resp:  [17-20] 18 (11/21 0859) BP: (122-147)/(70-88) 134/70 (11/21 0305) SpO2:  [96 %-100 %] 96 % (11/21 0859) FiO2 (%):  [21 %-96 %] 21 % (11/20 2012) Weight:  [93.6 kg] 93.6 kg (11/21 0305) Physical Exam: General: Patient in bed with head of bed elevated, no acute distress. Cardiovascular: Regular rate and rhythm, no murmurs/rubs/gallops. Respiratory: Normal work of breathing on room air.  Minimal expiratory wheeze versus transmitted upper breath sounds (loudest in upper fields, minimal in lower fields), no crackles. Abdomen: Bowel sounds present and normoactive bilaterally. Soft, nondistended.  Tender around umbilicus; rash/bruising present in this area.  PEG tube in place. Extremities: Skin warm, dry. No bilateral lower extremity edema.  Laboratory: Most recent CBC Lab Results  Component Value Date   WBC 4.8 08/13/2024   HGB 9.0 (L) 08/13/2024   HCT 26.5 (L) 08/13/2024   MCV 89.2 08/13/2024   PLT 143 (L) 08/13/2024   Most recent BMP    Latest Ref Rng & Units 08/13/2024    2:28 AM  BMP  Glucose 70 - 99 mg/dL 851   BUN 8 - 23 mg/dL 7   Creatinine 9.38 - 8.75 mg/dL 9.24   Sodium 864 - 854 mmol/L 133   Potassium 3.5 - 5.1 mmol/L 3.8   Chloride 98 - 111 mmol/L 95   CO2 22 - 32 mmol/L 28   Calcium 8.9 - 10.3 mg/dL 7.7   Mag: 2   Kyle Palma, MD 08/13/2024, 11:12 AM  PGY-1, [facility] Family Medicine FPTS Intern pager: [phone], text pages welcome Secure chat group [facility] Teaching Service

## 2024-08-13 NOTE — Assessment & Plan Note (Signed)
 Hgb stable at 9. Ferritin normal (284) in 01/2024. Suspect anemia related to liver disease. - Daily CBC - Transfusion threshold Hgb < 8

## 2024-08-13 NOTE — Discharge Instructions (Addendum)
 Dear Kyle Montoya,  Thank you for letting us  participate in your care. You were hospitalized for trouble breathing and cough and diagnosed with heart failure exacerbation and COPD exacerbation. You were treated with IV diuretics (medication that makes you pee a lot) and given breathing treatments and steroids with improvement in your breathing.  We also found a new lung nodule and fluid in your lungs. We took the fluid out, and ran labs on it that were concerning for possible reoccurrence of your lung cancer. We spoke with the lung doctors, who recommended getting a biopsy of the lung nodule outpatient.  POST-HOSPITAL & CARE INSTRUCTIONS Call your PCP to set up an appointment in the next ~1 week. Make sure you follow-up with your pulmonology team (lung doctors) and oncology team (cancer doctors). Go to your follow up appointments (listed below)  DOCTOR'S APPOINTMENT   Future Appointments  Date Time Provider Department Center  09/03/2024 11:00 AM Pleas, Dipti, MD [facility] [address]  10/12/2024  7:45 AM [facility] NURSE [facility] None  10/12/2024  8:00 AM Dina, Camie BRAVO, PA-C [facility] None    Contact information for follow-up providers     Tetter, Elston B, NP Follow up on 08/24/2024.   Specialty: Nurse Practitioner Why: 12:00 for hospital follow up Contact information: [address] [place] [address] [phone]              Contact information for after-discharge care     Home Medical Care     [facility]) .   Service: Home Health Services Why: Agency will call you to set up apt times Contact information: [address] [place]  [address] [phone]                     Take care and be well!  Family Medicine Teaching Service Inpatient Team [place]  [facility]  [address] [place], [place] [address] [phone]

## 2024-08-13 NOTE — Assessment & Plan Note (Signed)
 Rash: Benadryl  cream TID prn Hyponatremia: Na of 133, stable. Hypotension: Consider starting home midodrine  10 mg BID if BP drops with diuresis. BPH: Home Flomax  0.8 mg daily Cancer history: Lung cancer s/p resection, cholangiocarcinoma, CML; was on Dasatinib , though patient has not taken this since summer 2025 per his report

## 2024-08-23 LAB — MISC LABCORP TEST (SEND OUT): Labcorp test code: 9985

## 2024-09-03 ENCOUNTER — Inpatient Hospital Stay

## 2024-10-10 NOTE — Progress Notes (Signed)
 "      Memory Impairment   Kyle Montoya is a very pleasant 74 y.o. year old LH male with a history of hypertension, hyperlipidemia, B1 deficiency, arthritis, COPD-asthma, history of lung cancer and bile duct cancer on remission, history of leukemia on remission, iron  deficiency anemia,alcohol habituation, CHF, gout, seen today for evaluation of memory loss. MoCA unable to be done due to high anxiety. MMSE today is 22/30. Etiology is unclear, likely multifactorial, including possible contribution from vascular and alcohol intake. Patient is able to participate on ADLs and to drive without difficulties. Mood is very anxious . Patient is here alone.  Follow up in 3 months   MRI brain to further evaluate for structural abnormalities and vascular load  Pending on the above results will consider antidementia medication.  Alcohol cessation counseled, continue B1 supplements Continue to control mood as per PCP, may benefit from counseling for anxiety Check B12, TSH, B1   Discussed the use of AI scribe software for clinical note transcription with the patient, who gave verbal consent to proceed.  History of Present Illness Kyle Montoya is a 74 year old male who presents with memory changes.  Over the past year, he has experienced memory changes, primarily affecting recent information and conversations. He denies issues with long-term memory but acknowledges repeating questions. Six months ago, he got lost while [place] the country took the wrong road, but was only one time, but was able to reorient himself. No disorientation at home, denies losing items in unusual places, personality changes, hallucinations, paranoia, or seizures. Occasional difficulty sleeping through the night is noted, but no nightmares or vivid dreams. He manages his hygiene independently, with his son assisting in organizing medications, which he takes as directed.  Approximately six to seven months ago, he had a G-tube  placed due to swallowing difficulties, particularly with large pieces of meat. The G-tube was used only once, and he continues to eat by mouth without issues. He is able to swallow pills and to drink water .   He experiences chronic back pain and arthritis. He denies recent falls, head injuries, fractures, or vision problems, although he wears glasses for vision correction.  Socially, he lives with his son and consumes about five to six beers a day, with occasional vodka. He quit smoking three to four years ago. He engages in activities such as watching movies and socializing with family but does not frequently attend church or engage in reading or puzzles. He manages his finances with some assistance from his son.  There is no family history of dementia. He denies any history of stroke, tremors, or loss of smell.   10th grade education  Pertinent labs December 2025 H&H 8.1-24.6, MCV 92.5, B12 1018, calcium 7.6, ferritin normal 208   Allergies[1]  Current Outpatient Medications  Medication Instructions   albuterol  (PROVENTIL  HFA;VENTOLIN  HFA) 108 (90 BASE) MCG/ACT inhaler 2 puffs, Every 6 hours PRN   albuterol  (PROVENTIL ) 2.5 mg, Every 4 hours PRN   allopurinol  (ZYLOPRIM ) 300 mg, Per Tube, Daily   ascorbic acid (VITAMIN C) 500 mg, Daily   aspirin  EC 81 mg, Oral, Daily, Swallow whole.   Calcium Carb-Cholecalciferol (CALCIUM 600+D3) 600-5 MG-MCG TABS 2 tablets, Daily   [Paused] dasatinib  (SPRYCEL ) 100 mg, Per Tube, Daily   famotidine  (PEPCID ) 40 mg, Per Tube, Daily   feeding supplement, GLUCERNA SHAKE, (GLUCERNA SHAKE) LIQD 237 mLs, Oral, 3 times daily between meals   Ferrous Sulfate 28 MG TABS 1 tablet, Daily   Fluticasone -Umeclidin-Vilant  100-62.5-25 MCG/ACT AEPB 1 puff, Inhalation, Daily PRN   folic acid  (FOLVITE ) 1 mg, Oral, Daily   [Paused] midodrine  (PROAMATINE ) 10 mg, Per Tube, 2 times daily with meals   Multiple Vitamin (MULTIVITAMIN WITH MINERALS) TABS tablet 1 tablet, Per Tube,  Daily   polyethylene glycol (MIRALAX  / GLYCOLAX ) 17 g, Oral, Daily   potassium chloride  SA (KLOR-CON  M) 20 MEQ tablet 20 mEq, Oral, 2 times daily   senna-docusate (SENOKOT-S) 8.6-50 MG tablet 1 tablet, Oral, Daily at bedtime   tamsulosin  (FLOMAX ) 0.8 mg, Oral, Daily   thiamine  (VITAMIN B1) 100 mg, Oral, Daily   torsemide  (DEMADEX ) 20 mg, Oral, Daily   Vitamin D  2,000 Units, Per Tube, Daily    VITALS:   Vitals:   10/12/24 0749  BP: 133/82  Pulse: (!) 102  Resp: 20  SpO2: 96%  Weight: 207 lb (93.9 kg)  Height: 5' 9 (1.753 m)     Neurological Exam      No data to display             10/12/2024    8:00 AM  MMSE - Mini Mental State Exam  Orientation to time 2  Orientation to Place 4  Registration 3  Attention/ Calculation 4  Recall 0  Language- name 2 objects 2  Language- repeat 1  Language- follow 3 step command 3  Language- read & follow direction 1  Write a sentence 1  Copy design 1  Total score 22      Orientation:  Alert and oriented to person, not to place or time . No aphasia or dysarthria. Fund of knowledge is appropriate. Recent and remote memory impaired.  Attention and concentration are reduced .  Able to name objects and repeat phrases.   Delayed recall  0/3. Cranial nerves: There is good facial symmetry. Very anxious appearing.  Extraocular muscles are intact and visual fields are full to confrontational testing. Speech is fluent and clear. No tongue deviation. Hearing is intact to conversational tone.  Tone: Tone is good throughout. Sensation: Sensation is intact to light touch.  Vibration is intact at the bilateral big toe.  Coordination: The patient has no difficulty with RAM's or FNF bilaterally. Normal finger to nose  Motor: Strength is 5/5 in the bilateral upper and lower extremities. There is no pronator drift. There are no fasciculations noted. DTR's: Deep tendon reflexes are 2/4 bilaterally. Gait and Station: The patient is able to ambulate with  some difficulty due to arthritic pain. Gait is cautious and narrow. Stride length is normal.     Mildly flexes forward.    Thank you for allowing us  the opportunity to participate in the care of this nice patient. Please do not hesitate to contact us  for any questions or concerns.   Total time spent on today's visit was 45 minutes dedicated to this patient today, preparing to see patient, examining the patient, ordering tests and/or medications and counseling the patient, documenting clinical information in the EHR or other health record, independently interpreting results and communicating results to the patient/family, discussing treatment and goals, answering patient's questions and coordinating care.  Cc:  Tetter, Devin B, NP  Camie Sevin 10/12/2024 8:25 AM       [1]  Allergies Allergen Reactions   Pneumococcal Vaccines Swelling   "

## 2024-10-12 ENCOUNTER — Encounter: Payer: Self-pay | Admitting: Physician Assistant

## 2024-10-12 ENCOUNTER — Ambulatory Visit (INDEPENDENT_AMBULATORY_CARE_PROVIDER_SITE_OTHER): Admitting: Physician Assistant

## 2024-10-12 ENCOUNTER — Other Ambulatory Visit

## 2024-10-12 ENCOUNTER — Ambulatory Visit

## 2024-10-12 VITALS — BP 133/82 | HR 102 | Resp 20 | Ht 69.0 in | Wt 207.0 lb

## 2024-10-12 DIAGNOSIS — R413 Other amnesia: Secondary | ICD-10-CM

## 2024-10-12 NOTE — Patient Instructions (Addendum)
 It was a pleasure to see you today at our office.   Recommendations:  MRI of the brain, the radiology office will call you to arrange you appointment  [phone] Check labs today  [address] Based on the results of the MRI will consider starting a memory pill Decrease alcohol  to no more than 1 a day  Monitor anxiety with your primary doctor Follow up in 3 months Recommend visiting the website :  Dementia Success [facility] to better understand some behaviors related to memory loss.  For psychiatric meds, mood meds: Please have your primary care physician manage these medications.  If you have any severe symptoms of a stroke, or other severe issues such as confusion,severe chills or fever, etc call 911 or go to the ER as you may need to be evaluated further For guidance regarding [facility] and if placement were needed at the facility, contact Social Worker tel: [phone]  For assessment of decision of mental capacity and competency:  Call Dr. Rosaline Nine, geriatric psychiatrist at [phone] Counseling regarding caregiver distress, including caregiver depression, anxiety and issues regarding community resources, adult day care programs, adult living facilities, or memory care questions:  please contact your  Primary Doctor's Social Worker   FOR Memory  decline, memory medications: Call our office [phone]    [email or web address]   RECOMMENDATIONS FOR ALL PATIENTS WITH MEMORY PROBLEMS: 1. Continue to exercise (Recommend 30 minutes of walking everyday, or 3 hours every week) 2. Increase social interactions - continue going to [place] and enjoy social gatherings with friends and family 3. Eat healthy, avoid fried foods and eat more fruits and vegetables 4. Maintain adequate blood pressure, blood sugar, and blood cholesterol level. Reducing the risk of stroke and cardiovascular disease also helps promoting better  memory. 5. Avoid stressful situations. Live a simple life and avoid aggravations. Organize your time and prepare for the next day in anticipation. 6. Sleep well, avoid any interruptions of sleep and avoid any distractions in the bedroom that may interfere with adequate sleep quality 7. Avoid sugar, avoid sweets as there is a strong link between excessive sugar intake, diabetes, and cognitive impairment We discussed the Mediterranean diet, which has been shown to help patients reduce the risk of progressive memory disorders and reduces cardiovascular risk. This includes eating fish, eat fruits and green leafy vegetables, nuts like almonds and hazelnuts, walnuts, and also use olive oil. Avoid fast foods and fried foods as much as possible. Avoid sweets and sugar as sugar use has been linked to worsening of memory function.  There is always a concern of gradual progression of memory problems. If this is the case, then we may need to adjust level of care according to patient needs. Support, both to the patient and caregiver, should then be put into place.      You have been referred for a neuropsychological evaluation (i.e., evaluation of memory and thinking abilities). Please bring someone with you to this appointment if possible, as it is helpful for the doctor to hear from both you and another adult who knows you well. Please bring eyeglasses and hearing aids if you wear them.    The evaluation will take approximately 3 hours and has two parts:   The first part is a clinical interview with the neuropsychologist (Dr. Richie or Dr. Gayland). During the interview, the neuropsychologist will speak with you and the individual you brought to the appointment.    The second part of the evaluation is  testing with the doctor's technician Neal or Luke). During the testing, the technician will ask you to remember different types of material, solve problems, and answer some questionnaires. Your family member will  not be present for this portion of the evaluation.   Please note: We must reserve several hours of the neuropsychologist's time and the psychometrician's time for your evaluation appointment. As such, there is a No-Show fee of $100. If you are unable to attend any of your appointments, please contact our office as soon as possible to reschedule.      DRIVING: Regarding driving, in patients with progressive memory problems, driving will be impaired. We advise to have someone else do the driving if trouble finding directions or if minor accidents are reported. Independent driving assessment is available to determine safety of driving.   If you are interested in the driving assessment, you can contact the following:  [facility] in [place] [phone]  [facility] [phone]  [facility] [phone]  [facility] [phone] or [phone]   FALL PRECAUTIONS: Be cautious when walking. Scan the area for obstacles that may increase the risk of trips and falls. When getting up in the mornings, sit up at the edge of the bed for a few minutes before getting out of bed. Consider elevating the bed at the head end to avoid drop of blood pressure when getting up. Walk always in a well-lit room (use night lights in the walls). Avoid area rugs or power cords from appliances in the middle of the walkways. Use a walker or a cane if necessary and consider physical therapy for balance exercise. Get your eyesight checked regularly.  FINANCIAL OVERSIGHT: Supervision, especially oversight when making financial decisions or transactions is also recommended.  HOME SAFETY: Consider the safety of the kitchen when operating appliances like stoves, microwave oven, and blender. Consider having supervision and share cooking responsibilities until no longer able to participate in those. Accidents with firearms and other hazards in the house should be identified and  addressed as well.   ABILITY TO BE LEFT ALONE: If patient is unable to contact 911 operator, consider using LifeLine, or when the need is there, arrange for someone to stay with patients. Smoking is a fire hazard, consider supervision or cessation. Risk of wandering should be assessed by caregiver and if detected at any point, supervision and safe proof recommendations should be instituted.  MEDICATION SUPERVISION: Inability to self-administer medication needs to be constantly addressed. Implement a mechanism to ensure safe administration of the medications.      Mediterranean Diet A Mediterranean diet refers to food and lifestyle choices that are based on the traditions of countries located on the [employer] [place]. This way of eating has been shown to help prevent certain conditions and improve outcomes for people who have chronic diseases, like kidney disease and heart disease. What are tips for following this plan? Lifestyle  Cook and eat meals together with your family, when possible. Drink enough fluid to keep your urine clear or pale yellow. Be physically active every day. This includes: Aerobic exercise like running or swimming. Leisure activities like gardening, walking, or housework. Get 7-8 hours of sleep each night. If recommended by your health care provider, drink red wine in moderation. This means 1 glass a day for nonpregnant women and 2 glasses a day for men. A glass of wine equals 5 oz (150 mL). Reading food labels  Check the serving size of packaged foods. For foods such as  rice and pasta, the serving size refers to the amount of cooked product, not dry. Check the total fat in packaged foods. Avoid foods that have saturated fat or trans fats. Check the ingredients list for added sugars, such as corn syrup. Shopping  At the grocery store, buy most of your food from the areas near the walls of the store. This includes: Fresh fruits and vegetables (produce). Grains,  beans, nuts, and seeds. Some of these may be available in unpackaged forms or large amounts (in bulk). Fresh seafood. Poultry and eggs. Low-fat dairy products. Buy whole ingredients instead of prepackaged foods. Buy fresh fruits and vegetables in-season from local farmers markets. Buy frozen fruits and vegetables in resealable bags. If you do not have access to quality fresh seafood, buy precooked frozen shrimp or canned fish, such as tuna, salmon, or sardines. Buy small amounts of raw or cooked vegetables, salads, or olives from the deli or salad bar at your store. Stock your pantry so you always have certain foods on hand, such as olive oil, canned tuna, canned tomatoes, rice, pasta, and beans. Cooking  Cook foods with extra-virgin olive oil instead of using butter or other vegetable oils. Have meat as a side dish, and have vegetables or grains as your main dish. This means having meat in small portions or adding small amounts of meat to foods like pasta or stew. Use beans or vegetables instead of meat in common dishes like chili or lasagna. Experiment with different cooking methods. Try roasting or broiling vegetables instead of steaming or sauteing them. Add frozen vegetables to soups, stews, pasta, or rice. Add nuts or seeds for added healthy fat at each meal. You can add these to yogurt, salads, or vegetable dishes. Marinate fish or vegetables using olive oil, lemon juice, garlic, and fresh herbs. Meal planning  Plan to eat 1 vegetarian meal one day each week. Try to work up to 2 vegetarian meals, if possible. Eat seafood 2 or more times a week. Have healthy snacks readily available, such as: Vegetable sticks with hummus. Greek yogurt. Fruit and nut trail mix. Eat balanced meals throughout the week. This includes: Fruit: 2-3 servings a day Vegetables: 4-5 servings a day Low-fat dairy: 2 servings a day Fish, poultry, or lean meat: 1 serving a day Beans and legumes: 2 or more  servings a week Nuts and seeds: 1-2 servings a day Whole grains: 6-8 servings a day Extra-virgin olive oil: 3-4 servings a day Limit red meat and sweets to only a few servings a month What are my food choices? Mediterranean diet Recommended Grains: Whole-grain pasta. Brown rice. Bulgar wheat. Polenta. Couscous. Whole-wheat bread. [place] Made[place]. Vegetables: Artichokes. Beets. Broccoli. Cabbage. Carrots. Eggplant. Green beans. Chard. Kale. Spinach. Onions. Leeks. Peas. Squash. Tomatoes. Peppers. Radishes. Fruits: Apples. Apricots. Avocado. Berries. Bananas. Cherries. Dates. Figs. Grapes. Lemons. Melon. Oranges. Peaches. Plums. Pomegranate. Meats and other protein foods: Beans. Almonds. Sunflower seeds. Pine nuts. Peanuts. Cod. Salmon. Scallops. Shrimp. Tuna. Tilapia. Clams. Oysters. Eggs. Dairy: Low-fat milk. Cheese. Greek yogurt. Beverages: Water . Red wine. Herbal tea. Fats and oils: Extra virgin olive oil. Avocado oil. Grape seed oil. Sweets and desserts: Greek yogurt with honey. Baked apples. Poached pears. Trail mix. Seasoning and other foods: Basil. Cilantro. Coriander. Cumin. Mint. Parsley. Sage. Rosemary. Tarragon. Garlic. Oregano. Thyme. Pepper. Balsalmic vinegar. Tahini. Hummus. Tomato sauce. Olives. Mushrooms. Limit these Grains: Prepackaged pasta or rice dishes. Prepackaged cereal with added sugar. Vegetables: Deep fried potatoes (french fries). Fruits: Fruit canned in syrup. Meats and  other protein foods: Beef. Pork. Lamb. Poultry with skin. Hot dogs. [place]. Dairy: Ice cream. Sour cream. Whole milk. Beverages: Juice. Sugar-sweetened soft drinks. Beer. Liquor and spirits. Fats and oils: Butter. Canola oil. Vegetable oil. Beef fat (tallow). Lard. Sweets and desserts: Cookies. Cakes. Pies. Candy. Seasoning and other foods: Mayonnaise. Premade sauces and marinades. The items listed may not be a complete list. Talk with your dietitian about what dietary choices are right for  you. Summary The Mediterranean diet includes both food and lifestyle choices. Eat a variety of fresh fruits and vegetables, beans, nuts, seeds, and whole grains. Limit the amount of red meat and sweets that you eat. Talk with your health care provider about whether it is safe for you to drink red wine in moderation. This means 1 glass a day for nonpregnant women and 2 glasses a day for men. A glass of wine equals 5 oz (150 mL). This information is not intended to replace advice given to you by your health care provider. Make sure you discuss any questions you have with your health care provider. Document Released: 05/02/2016 Document Revised: 06/04/2016 Document Reviewed: 05/02/2016 Elsevier Interactive Patient Education  2017 [employer].

## 2024-10-13 ENCOUNTER — Ambulatory Visit: Payer: Self-pay | Admitting: Physician Assistant

## 2024-10-13 NOTE — Progress Notes (Signed)
 No answer at 3:33pm 10/13/2024

## 2024-10-15 LAB — VITAMIN B12: Vitamin B-12: 394 pg/mL (ref 200–1100)

## 2024-10-15 LAB — TSH: TSH: 3.87 m[IU]/L (ref 0.40–4.50)

## 2024-10-15 LAB — VITAMIN B1: Vitamin B1 (Thiamine): 45 nmol/L — ABNORMAL HIGH (ref 8–30)

## 2024-10-27 ENCOUNTER — Telehealth: Payer: Self-pay

## 2024-10-27 NOTE — Telephone Encounter (Signed)
 Mri brain without contrast not performed. Mulitple messages left to call [facility]. Fyi.

## 2025-01-11 ENCOUNTER — Ambulatory Visit: Payer: Self-pay | Admitting: Physician Assistant
